# Patient Record
Sex: Female | Born: 1960 | Race: Black or African American | Hispanic: No | Marital: Married | Smoking: Former smoker
Health system: Southern US, Community
[De-identification: ages and names within clinical notes are randomized; demographics above are authoritative.]

## PROBLEM LIST (undated history)

## (undated) DIAGNOSIS — N186 End stage renal disease: Secondary | ICD-10-CM

## (undated) DIAGNOSIS — I38 Endocarditis, valve unspecified: Secondary | ICD-10-CM

## (undated) DIAGNOSIS — K922 Gastrointestinal hemorrhage, unspecified: Secondary | ICD-10-CM

## (undated) DIAGNOSIS — J90 Pleural effusion, not elsewhere classified: Secondary | ICD-10-CM

## (undated) DIAGNOSIS — I480 Paroxysmal atrial fibrillation: Secondary | ICD-10-CM

## (undated) DIAGNOSIS — J449 Chronic obstructive pulmonary disease, unspecified: Secondary | ICD-10-CM

## (undated) DIAGNOSIS — Z992 Dependence on renal dialysis: Secondary | ICD-10-CM

## (undated) DIAGNOSIS — D631 Anemia in chronic kidney disease: Secondary | ICD-10-CM

## (undated) DIAGNOSIS — D649 Anemia, unspecified: Secondary | ICD-10-CM

## (undated) DIAGNOSIS — K219 Gastro-esophageal reflux disease without esophagitis: Secondary | ICD-10-CM

## (undated) DIAGNOSIS — I5042 Chronic combined systolic (congestive) and diastolic (congestive) heart failure: Secondary | ICD-10-CM

## (undated) DIAGNOSIS — E119 Type 2 diabetes mellitus without complications: Secondary | ICD-10-CM

## (undated) DIAGNOSIS — N289 Disorder of kidney and ureter, unspecified: Secondary | ICD-10-CM

## (undated) DIAGNOSIS — K439 Ventral hernia without obstruction or gangrene: Secondary | ICD-10-CM

## (undated) DIAGNOSIS — K4090 Unilateral inguinal hernia, without obstruction or gangrene, not specified as recurrent: Secondary | ICD-10-CM

## (undated) DIAGNOSIS — N2581 Secondary hyperparathyroidism of renal origin: Secondary | ICD-10-CM

## (undated) DIAGNOSIS — E785 Hyperlipidemia, unspecified: Secondary | ICD-10-CM

## (undated) DIAGNOSIS — I69322 Dysarthria following cerebral infarction: Secondary | ICD-10-CM

## (undated) DIAGNOSIS — I050 Rheumatic mitral stenosis: Secondary | ICD-10-CM

## (undated) DIAGNOSIS — I700 Atherosclerosis of aorta: Secondary | ICD-10-CM

## (undated) DIAGNOSIS — I6529 Occlusion and stenosis of unspecified carotid artery: Secondary | ICD-10-CM

## (undated) DIAGNOSIS — I639 Cerebral infarction, unspecified: Secondary | ICD-10-CM

## (undated) DIAGNOSIS — I052 Rheumatic mitral stenosis with insufficiency: Secondary | ICD-10-CM

## (undated) DIAGNOSIS — I10 Essential (primary) hypertension: Secondary | ICD-10-CM

## (undated) DIAGNOSIS — N189 Chronic kidney disease, unspecified: Secondary | ICD-10-CM

## (undated) DIAGNOSIS — G459 Transient cerebral ischemic attack, unspecified: Secondary | ICD-10-CM

## (undated) DIAGNOSIS — N185 Chronic kidney disease, stage 5: Secondary | ICD-10-CM

## (undated) DIAGNOSIS — J45909 Unspecified asthma, uncomplicated: Secondary | ICD-10-CM

## (undated) DIAGNOSIS — I214 Non-ST elevation (NSTEMI) myocardial infarction: Secondary | ICD-10-CM

## (undated) HISTORY — DX: Rheumatic mitral stenosis: I05.0

## (undated) HISTORY — DX: Chronic combined systolic (congestive) and diastolic (congestive) heart failure: I50.42

## (undated) HISTORY — DX: Hyperlipidemia, unspecified: E78.5

## (undated) HISTORY — DX: Ventral hernia without obstruction or gangrene: K43.9

## (undated) HISTORY — DX: Essential (primary) hypertension: I10

## (undated) HISTORY — DX: Chronic obstructive pulmonary disease, unspecified: J44.9

## (undated) HISTORY — DX: End stage renal disease: N18.6

## (undated) HISTORY — DX: Anemia, unspecified: D64.9

## (undated) HISTORY — DX: Type 2 diabetes mellitus without complications: E11.9

## (undated) HISTORY — DX: Dependence on renal dialysis: Z99.2

## (undated) HISTORY — PX: CHOLECYSTECTOMY: SHX55

## (undated) HISTORY — DX: Endocarditis, valve unspecified: I38

## (undated) HISTORY — DX: Unilateral inguinal hernia, without obstruction or gangrene, not specified as recurrent: K40.90

## (undated) HISTORY — PX: ABDOMINAL HYSTERECTOMY: SHX81

## (undated) HISTORY — DX: Paroxysmal atrial fibrillation: I48.0

## (undated) HISTORY — PX: FOOT SURGERY: SHX648

## (undated) HISTORY — PX: KNEE SURGERY: SHX244

---

## 2004-05-21 ENCOUNTER — Other Ambulatory Visit: Payer: Self-pay

## 2004-08-22 ENCOUNTER — Other Ambulatory Visit: Payer: Self-pay

## 2004-11-14 ENCOUNTER — Ambulatory Visit: Payer: Self-pay | Admitting: Unknown Physician Specialty

## 2005-12-31 ENCOUNTER — Ambulatory Visit: Payer: Self-pay | Admitting: Nephrology

## 2008-03-15 ENCOUNTER — Emergency Department: Payer: Self-pay | Admitting: Emergency Medicine

## 2008-03-30 ENCOUNTER — Ambulatory Visit: Payer: Self-pay | Admitting: Gastroenterology

## 2008-05-12 ENCOUNTER — Ambulatory Visit: Payer: Self-pay | Admitting: Internal Medicine

## 2008-05-17 ENCOUNTER — Ambulatory Visit: Payer: Self-pay | Admitting: Internal Medicine

## 2008-06-20 LAB — HM COLONOSCOPY: HM Colonoscopy: NORMAL

## 2010-05-21 ENCOUNTER — Ambulatory Visit: Payer: Self-pay | Admitting: Internal Medicine

## 2011-09-09 ENCOUNTER — Other Ambulatory Visit: Payer: Self-pay | Admitting: Internal Medicine

## 2011-09-10 MED ORDER — AMLODIPINE BESYLATE 5 MG PO TABS: 5.0000 mg | ORAL_TABLET | Freq: Every day | ORAL | Status: DC

## 2011-11-18 ENCOUNTER — Telehealth: Payer: Self-pay | Admitting: Internal Medicine

## 2011-11-18 NOTE — Telephone Encounter (Signed)
Spoke with patient. She has an appt to see you on Thursday, but she is asking what she could take to help with her symptoms until she is seen. She is having some sore throat, feeling dizzy, having some drainage. She says that she has a lot of congestion and it is causing her to have a hard time breathing especially when she is lying down. Please advise.

## 2011-11-18 NOTE — Telephone Encounter (Signed)
Left message asking patient to return my call.

## 2011-11-18 NOTE — Telephone Encounter (Signed)
Patient is congested ,having headaches and dizziness. Please call needing to be seen or something called in and patient also needs a note for work she has been out for two days.

## 2011-11-18 NOTE — Telephone Encounter (Signed)
Dorothy Long,    my response was not routed apparently .  Mucines DM for cough and drainaiage,   Sudafed PE  For dizziness.

## 2011-11-18 NOTE — Telephone Encounter (Signed)
She can take mucinex DM or the drainage and cough and sudafed PE for the dizziness.

## 2011-11-20 ENCOUNTER — Ambulatory Visit: Payer: Self-pay | Admitting: Internal Medicine

## 2011-11-20 NOTE — Telephone Encounter (Signed)
Patient notified of OTC medications she can take.

## 2012-03-08 ENCOUNTER — Telehealth: Payer: Self-pay | Admitting: Internal Medicine

## 2012-03-08 MED ORDER — AMLODIPINE BESYLATE 5 MG PO TABS: 5.0000 mg | ORAL_TABLET | Freq: Every day | ORAL | Status: DC

## 2012-03-08 NOTE — Telephone Encounter (Signed)
Rx called in . Patient notified

## 2012-03-08 NOTE — Telephone Encounter (Signed)
[phone] or cell [phone]  Pt called checking about her refill on amlodipine [facility] [place] has not heard back from you Pt is making cpx appointment for 05/24/12 Pt is completely out of meds

## 2012-05-24 ENCOUNTER — Encounter: Payer: Self-pay | Admitting: Internal Medicine

## 2012-06-28 ENCOUNTER — Encounter: Payer: Self-pay | Admitting: Internal Medicine

## 2012-07-21 ENCOUNTER — Ambulatory Visit (INDEPENDENT_AMBULATORY_CARE_PROVIDER_SITE_OTHER): Payer: BC Managed Care – PPO | Admitting: Internal Medicine

## 2012-07-21 ENCOUNTER — Encounter: Payer: Self-pay | Admitting: Internal Medicine

## 2012-07-21 VITALS — BP 140/88 | HR 92 | Temp 97.8°F | Resp 14 | Ht 70.0 in | Wt 116.2 lb

## 2012-07-21 DIAGNOSIS — E785 Hyperlipidemia, unspecified: Secondary | ICD-10-CM

## 2012-07-21 DIAGNOSIS — Z720 Tobacco use: Secondary | ICD-10-CM

## 2012-07-21 DIAGNOSIS — Z1211 Encounter for screening for malignant neoplasm of colon: Secondary | ICD-10-CM

## 2012-07-21 DIAGNOSIS — R609 Edema, unspecified: Secondary | ICD-10-CM

## 2012-07-21 DIAGNOSIS — Z13220 Encounter for screening for lipoid disorders: Secondary | ICD-10-CM

## 2012-07-21 DIAGNOSIS — R634 Abnormal weight loss: Secondary | ICD-10-CM

## 2012-07-21 DIAGNOSIS — E559 Vitamin D deficiency, unspecified: Secondary | ICD-10-CM

## 2012-07-21 DIAGNOSIS — Z1239 Encounter for other screening for malignant neoplasm of breast: Secondary | ICD-10-CM

## 2012-07-21 DIAGNOSIS — Z800 Family history of malignant neoplasm of digestive organs: Secondary | ICD-10-CM

## 2012-07-21 DIAGNOSIS — I10 Essential (primary) hypertension: Secondary | ICD-10-CM

## 2012-07-21 DIAGNOSIS — R5383 Other fatigue: Secondary | ICD-10-CM

## 2012-07-21 DIAGNOSIS — F17200 Nicotine dependence, unspecified, uncomplicated: Secondary | ICD-10-CM

## 2012-07-21 DIAGNOSIS — Z87891 Personal history of nicotine dependence: Secondary | ICD-10-CM | POA: Insufficient documentation

## 2012-07-21 DIAGNOSIS — N189 Chronic kidney disease, unspecified: Secondary | ICD-10-CM

## 2012-07-21 DIAGNOSIS — R600 Localized edema: Secondary | ICD-10-CM

## 2012-07-21 DIAGNOSIS — A499 Bacterial infection, unspecified: Secondary | ICD-10-CM

## 2012-07-21 DIAGNOSIS — B9689 Other specified bacterial agents as the cause of diseases classified elsewhere: Secondary | ICD-10-CM

## 2012-07-21 DIAGNOSIS — J329 Chronic sinusitis, unspecified: Secondary | ICD-10-CM

## 2012-07-21 DIAGNOSIS — R5381 Other malaise: Secondary | ICD-10-CM

## 2012-07-21 LAB — COMPREHENSIVE METABOLIC PANEL
ALT: 14 U/L (ref 0–35)
AST: 30 U/L (ref 0–37)
Albumin: 3.2 g/dL — ABNORMAL LOW (ref 3.5–5.2)
Alkaline Phosphatase: 78 U/L (ref 39–117)
Glucose, Bld: 79 mg/dL (ref 70–99)
Potassium: 4.6 mEq/L (ref 3.5–5.1)
Sodium: 138 mEq/L (ref 135–145)
Total Bilirubin: 0.8 mg/dL (ref 0.3–1.2)
Total Protein: 6.8 g/dL (ref 6.0–8.3)

## 2012-07-21 LAB — CBC WITH DIFFERENTIAL/PLATELET
Basophils Absolute: 0 10*3/uL (ref 0.0–0.1)
Eosinophils Absolute: 0.1 10*3/uL (ref 0.0–0.7)
HCT: 42.1 % (ref 36.0–46.0)
Lymphs Abs: 1.6 10*3/uL (ref 0.7–4.0)
MCHC: 33.6 g/dL (ref 30.0–36.0)
MCV: 90.2 fl (ref 78.0–100.0)
Monocytes Absolute: 0.4 10*3/uL (ref 0.1–1.0)
Neutrophils Relative %: 57.4 % (ref 43.0–77.0)
Platelets: 151 10*3/uL (ref 150.0–400.0)
RDW: 14.1 % (ref 11.5–14.6)
WBC: 4.8 10*3/uL (ref 4.5–10.5)

## 2012-07-21 LAB — MICROALBUMIN / CREATININE URINE RATIO
Creatinine,U: 107.4 mg/dL
Microalb Creat Ratio: 452.4 mg/g — ABNORMAL HIGH (ref 0.0–30.0)
Microalb, Ur: 485.7 mg/dL — ABNORMAL HIGH (ref 0.0–1.9)

## 2012-07-21 MED ORDER — NICOTINE 14 MG/24HR TD PT24: 1.0000 | MEDICATED_PATCH | TRANSDERMAL | Status: AC

## 2012-07-21 MED ORDER — AMOXICILLIN-POT CLAVULANATE 875-125 MG PO TABS
1.0000 | ORAL_TABLET | Freq: Two times a day (BID) | ORAL | Status: AC
Start: 2012-07-21 — End: 2012-07-31

## 2012-07-21 NOTE — Progress Notes (Signed)
Patient ID: Dorothy Long, female   DOB: 1961-02-10, 51 y.o.   MRN: [id number] Patient Active Problem List  Diagnosis  . Sinusitis, bacterial  . Hypertension  . Family history of colon cancer  . Tobacco abuse  . Chronic kidney disease  . Hyperlipidemia  . Edema leg    Subjective:  CC:   Chief Complaint  Patient presents with  . Annual Exam    HPI:   Dorothy Ratliffis a 51 y.o. female who presents with history of hypertension.  Low vitamin D, history of chronic  kidney disease and new complaints.   Recent onset of upper respiratory symptoms with sore throat, itchy eyes, congestion involving sinuses and eustachian tubes.  No fevers but chills,  Nasal discharge is yellow.  Continues to abuse tobacco so she has a chronic cough. Smoking  a pack daily.  New onset LE edema.    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   Past Medical History  Diagnosis Date  . Hypertension   . Chronic kidney disease   . Hyperlipidemia         Allergies  Allergen Reactions  . Diflucan (Fluconazole)   . Morphine And Related   . Tetracyclines & Related   . Valium (Diazepam)     Past Surgical History  Procedure Date  . Abdominal hysterectomy   . Cholecystectomy     History   Social History  . Marital Status: Married    Spouse Name: N/A    Number of Children: N/A  . Years of Education: N/A   Occupational History  . Not on file.   Social History Main Topics  . Smoking status: Current Everyday Smoker -- 1.0 packs/day    Types: Cigarettes  . Smokeless tobacco: Never Used  . Alcohol Use: Yes  . Drug Use: No  . Sexually Active: Not on file   Other Topics Concern  . Not on file   Social History Narrative  . No narrative on file   Family History  Problem Relation Age of Onset  . Cancer Mother     colon  . Heart disease Father   . Hypertension Father    Review of Systems:  Review of Systems    Constitutional: Negative.   HENT: Positive for ear pain, congestion and sore throat.   Eyes: Positive for redness.  Respiratory: Positive for cough.   Cardiovascular: Positive for leg swelling.  Gastrointestinal: Negative.   Genitourinary: Negative.   Musculoskeletal: Negative.  Negative for joint pain.  Skin: Negative.   Neurological: Positive for headaches. Negative for dizziness.  Endo/Heme/Allergies: Negative.   Psychiatric/Behavioral: Negative.    the remainder of a comprehensive review of systems is negative.    Objective:  BP 140/88  Pulse 92  Temp 97.8 F (36.6 C) (Oral)  Resp 14  Ht 5\' 10"  (1.778 m)  Wt 116 lb 4 oz (52.731 kg)  BMI 16.68 kg/m2  SpO2 99%  General appearance: alert, cooperative and appears stated age Ears: normal TM's and external ear canals both ears Throat: lips, mucosa, and tongue normal; teeth and gums normal Neck: no adenopathy, no carotid bruit, supple, symmetrical, trachea midline and thyroid not enlarged, symmetric, no tenderness/mass/nodules Back: symmetric, no curvature. ROM normal. No CVA tenderness. Lungs: clear to auscultation bilaterally Heart: regular rate and rhythm, S1, S2 normal, no murmur, click, rub or gallop Abdomen: soft, non-tender; bowel sounds normal; no masses,  no organomegaly Pulses: 2+ and  symmetric Skin: Skin color, texture, turgor normal. No rashes or lesions Lymph nodes: Cervical, supraclavicular, and axillary nodes normal.  Assessment and Plan:  Tobacco abuse We reviewed her history of smoking. She quit once for a year on a bet  but resumed after she wanted the bet she is not interested in pharmacotherapy wants to try the NicoDerm patches. I've written a prescription for these. .    Sinusitis, bacterial Her physical as exam suggests involvement of frontal sinuses and both years with bacterial infection. Recommended use of amoxicillin for one week, sinus lavage and Afrin topical decongestant.  Hypertension Not  well controlled on current amlodipine dose. She is having increased lower extremity edema and it is unclear whether this is due to the amlodipine or due to venous insufficiency.  given her concurrent kidney disease as evidenced by her creatinine on today's labs. Do not want to start an ACE inhibitor or ARB until we know what her kidney function is stable. We will consider adding a low-dose beta blocker for improved control.   Edema leg She has proteinuria by current urinalysis along with chronic kidney disease. I do not want to start a diuretic at this point given her GFR less than 30 and no recent reference point in time. She has no signs of heart failure on exam. She will need evaluation of circulation in the lower extremities with venous Dopplers.  Family history of colon cancer She is apparent with a history of colon cancer and has not had screening herself I given her age. Will refer for colonoscopy at her annual physical.  Hyperlipidemia Currently uncontrolled with LDL of 160. She will need to start a statin and a goal will be 7200 given her concurrent hypertension and chronic kidney disease.  Chronic kidney disease There are no records available for review and comparison. Current GFR is less than 30. Electrolytes are normal. Will send her for renal ultrasound and nephrology evaluation.   Updated Medication List Outpatient Encounter Prescriptions as of 07/21/2012  Medication Sig Dispense Refill  . amLODipine (NORVASC) 5 MG tablet Take 1 tablet (5 mg total) by mouth daily.  30 tablet  3  . amoxicillin-clavulanate (AUGMENTIN) 875-125 MG per tablet Take 1 tablet by mouth 2 (two) times daily.  14 tablet  0  . atorvastatin (LIPITOR) 20 MG tablet Take 1 tablet (20 mg total) by mouth daily.  90 tablet  3  . metoprolol tartrate (LOPRESSOR) 25 MG tablet Take 1 tablet (25 mg total) by mouth 2 (two) times daily.  180 tablet  3  . nicotine (NICODERM CQ) 14 mg/24hr patch Place 1 patch onto the skin  daily.  28 patch  3     Orders Placed This Encounter  Procedures  . MM Digital Screening  . US Renal  . Microalbumin / creatinine urine ratio  . TSH  . LDL cholesterol, direct  . CBC with Differential  . Comprehensive metabolic panel  . Vitamin D 25 hydroxy  . Ambulatory referral to Gastroenterology    Return in about 3 months (around 10/21/2012).

## 2012-07-21 NOTE — Patient Instructions (Addendum)
You have a sinus/ear infection   .  I am prescribing an antibiotic augmentin, please take it after a big breakfast and after a  Big dinner.   I also advise use of the following OTC meds to help with your other symptoms.   Take generic OTC benadryl 25 mg every 8 hours for the drainage,  Sudafed PE  10 to 30 mg every 8 hours for the congestion, you may substitute Afrin nasal spray for the nighttime dose of sudafed PE  If needed to prevent insomnia.  flushes your sinuses twice daily with Simply Saline (do over the sink because if you do it right you will spit out globs of mucus)  Use OTC  Delsym   FOR THE COUGH.  Gargle with salt water as needed for sore throat.    Return at your convenience for your annual physical  We will call you with the results of your labs and a new blood pressure medication

## 2012-07-22 ENCOUNTER — Telehealth: Payer: Self-pay | Admitting: Internal Medicine

## 2012-07-22 ENCOUNTER — Encounter: Payer: Self-pay | Admitting: Internal Medicine

## 2012-07-22 DIAGNOSIS — N289 Disorder of kidney and ureter, unspecified: Secondary | ICD-10-CM

## 2012-07-22 DIAGNOSIS — E559 Vitamin D deficiency, unspecified: Secondary | ICD-10-CM | POA: Insufficient documentation

## 2012-07-22 DIAGNOSIS — R600 Localized edema: Secondary | ICD-10-CM | POA: Insufficient documentation

## 2012-07-22 DIAGNOSIS — N189 Chronic kidney disease, unspecified: Secondary | ICD-10-CM | POA: Insufficient documentation

## 2012-07-22 DIAGNOSIS — E785 Hyperlipidemia, unspecified: Secondary | ICD-10-CM | POA: Insufficient documentation

## 2012-07-22 LAB — VITAMIN D 25 HYDROXY (VIT D DEFICIENCY, FRACTURES): Vit D, 25-Hydroxy: 17 ng/mL — ABNORMAL LOW (ref 30–89)

## 2012-07-22 MED ORDER — METOPROLOL TARTRATE 25 MG PO TABS: 25.0000 mg | ORAL_TABLET | Freq: Two times a day (BID) | ORAL | Status: DC

## 2012-07-22 MED ORDER — ATORVASTATIN CALCIUM 20 MG PO TABS: 20.0000 mg | ORAL_TABLET | Freq: Every day | ORAL | Status: DC

## 2012-07-22 MED ORDER — ERGOCALCIFEROL 1.25 MG (50000 UT) PO CAPS: 50000.0000 [IU] | ORAL_CAPSULE | ORAL | Status: AC

## 2012-07-22 NOTE — Assessment & Plan Note (Signed)
Her physical as exam suggests involvement of frontal sinuses and both years with bacterial infection. Recommended use of amoxicillin for one week, sinus lavage and Afrin topical decongestant.

## 2012-07-22 NOTE — Assessment & Plan Note (Signed)
Not well controlled on current amlodipine dose. She is having increased lower extremity edema and it is unclear whether this is due to the amlodipine or due to venous insufficiency.  given her concurrent kidney disease as evidenced by her creatinine on today's labs. Do not want to start an ACE inhibitor or ARB until we know what her kidney function is stable. We will consider adding a low-dose beta blocker for improved control.

## 2012-07-22 NOTE — Assessment & Plan Note (Signed)
There are no records available for review and comparison. Current GFR is less than 30. Electrolytes are normal. Will send her for renal ultrasound and nephrology evaluation.

## 2012-07-22 NOTE — Assessment & Plan Note (Signed)
Currently uncontrolled with LDL of 160. She will need to start a statin and a goal will be 7200 given her concurrent hypertension and chronic kidney disease.

## 2012-07-22 NOTE — Telephone Encounter (Signed)
Patient wants to know why she is having a Renal US.

## 2012-07-22 NOTE — Assessment & Plan Note (Signed)
She is apparent with a history of colon cancer and has not had screening herself I given her age. Will refer for colonoscopy at her annual physical.

## 2012-07-22 NOTE — Assessment & Plan Note (Signed)
Current level is 17. She's not taking any supplements clear. Will call in Drisdol 50,000 units weekly for one month followed by Onalee Hua vitamin D thereafter at 1000 units daily.

## 2012-07-22 NOTE — Assessment & Plan Note (Signed)
We reviewed her history of smoking. She quit once for a year on a bet  but resumed after she wanted the bet she is not interested in pharmacotherapy wants to try the NicoDerm patches. I've written a prescription for these. Dorothy Long

## 2012-07-22 NOTE — Assessment & Plan Note (Signed)
She has proteinuria by current urinalysis along with chronic kidney disease. I do not want to start a diuretic at this point given her GFR less than 30 and no recent reference point in time. She has no signs of heart failure on exam. She will need evaluation of circulation in the lower extremities with venous Dopplers.

## 2012-07-22 NOTE — Telephone Encounter (Signed)
I have also called in several new medications for her to take in addition to her current amlodipine. These include metoprolol twice daily for her blood pressure, Drisdol which is  The mega vitamin D dose.  once a week for a month,  and Lipitor for her cholesterol. In patients with kidney disease it is very important to keep the cholesterol control in her status way above goal with her LDL currently 160.

## 2012-07-23 NOTE — Telephone Encounter (Signed)
Left message asking patient to return my call.

## 2012-07-23 NOTE — Telephone Encounter (Signed)
See other phone and result note.

## 2012-07-27 ENCOUNTER — Telehealth: Payer: Self-pay | Admitting: Internal Medicine

## 2012-07-27 NOTE — Telephone Encounter (Signed)
Patient wants to talk with the doctor as to why she is being seen by a nephrologist.

## 2012-07-27 NOTE — Telephone Encounter (Signed)
I have tried to call patient a few times with no call back.  I have asked Erie Noe the next time patient calls to come get me so I can talk to her when she calls since we keep playing "phone tag".  This is being handled in the result note and telephone encounter.

## 2012-07-28 ENCOUNTER — Ambulatory Visit: Payer: Self-pay | Admitting: Internal Medicine

## 2012-07-30 NOTE — Telephone Encounter (Signed)
Left another message asking patient to return my call.

## 2012-08-02 NOTE — Telephone Encounter (Signed)
Patient was notified.

## 2012-08-04 ENCOUNTER — Encounter: Payer: Self-pay | Admitting: *Deleted

## 2012-08-05 ENCOUNTER — Encounter: Payer: Self-pay | Admitting: Internal Medicine

## 2012-08-24 ENCOUNTER — Other Ambulatory Visit: Payer: Self-pay | Admitting: *Deleted

## 2012-08-24 MED ORDER — AMLODIPINE BESYLATE 5 MG PO TABS: 5.0000 mg | ORAL_TABLET | Freq: Every day | ORAL | Status: DC

## 2012-12-13 ENCOUNTER — Ambulatory Visit: Payer: Self-pay | Admitting: Internal Medicine

## 2012-12-13 ENCOUNTER — Ambulatory Visit: Payer: BC Managed Care – PPO | Admitting: Internal Medicine

## 2012-12-13 ENCOUNTER — Telehealth: Payer: Self-pay | Admitting: Internal Medicine

## 2012-12-13 LAB — HM MAMMOGRAPHY: HM Mammogram: NORMAL

## 2012-12-13 NOTE — Telephone Encounter (Signed)
Appt made on 1/6.

## 2012-12-13 NOTE — Telephone Encounter (Signed)
I cannot fill out the form she dropped of until she has  To have a current  BP check by this office.  Her form is daue by January 10th she she needs to make appt with me or Raquel ASAP

## 2012-12-20 ENCOUNTER — Encounter: Payer: Self-pay | Admitting: Internal Medicine

## 2012-12-20 ENCOUNTER — Ambulatory Visit (INDEPENDENT_AMBULATORY_CARE_PROVIDER_SITE_OTHER): Payer: BC Managed Care – PPO | Admitting: Internal Medicine

## 2012-12-20 VITALS — BP 130/84 | HR 55 | Temp 98.4°F | Resp 16 | Wt 121.2 lb

## 2012-12-20 DIAGNOSIS — Z716 Tobacco abuse counseling: Secondary | ICD-10-CM

## 2012-12-20 DIAGNOSIS — Z1331 Encounter for screening for depression: Secondary | ICD-10-CM

## 2012-12-20 DIAGNOSIS — Z7189 Other specified counseling: Secondary | ICD-10-CM

## 2012-12-20 DIAGNOSIS — E785 Hyperlipidemia, unspecified: Secondary | ICD-10-CM

## 2012-12-20 DIAGNOSIS — H6690 Otitis media, unspecified, unspecified ear: Secondary | ICD-10-CM | POA: Insufficient documentation

## 2012-12-20 DIAGNOSIS — I10 Essential (primary) hypertension: Secondary | ICD-10-CM

## 2012-12-20 DIAGNOSIS — F17200 Nicotine dependence, unspecified, uncomplicated: Secondary | ICD-10-CM

## 2012-12-20 MED ORDER — PREDNISONE (PAK) 10 MG PO TABS: ORAL_TABLET | ORAL | Status: DC

## 2012-12-20 MED ORDER — LEVOFLOXACIN 500 MG PO TABS: 500.0000 mg | ORAL_TABLET | Freq: Every day | ORAL | Status: DC

## 2012-12-20 NOTE — Assessment & Plan Note (Signed)
Given chronicity of symptoms, development of facial pain and exam consistent with bacterial URI,  Will treat with empiric antibiotics, decongestants, and saline lavage.   

## 2012-12-20 NOTE — Patient Instructions (Addendum)
You have a sinus/ear infection   .  I am prescribing an antibiotic levaquin  and prednisone taper  To manage the infectin and the inflammation in your ear/sinuses.   I also advise use of the following OTC meds to help with your other symptoms.   Take generic OTC benadryl 25 mg every 8 hours for the drainage,  Sudafed PE  10 to 30 mg every 8 hours for the congestion, you may substitute Afrin nasal spray for the nighttime dose of sudafed PE  If needed to prevent insomnia.  flushes your sinuses twice daily with Simply Saline (do over the sink because if you do it right you will spit out globs of mucus)   Gargle with salt water as needed for sore throat.    Continue amlodipine and metoprolol for your blood pressures

## 2012-12-20 NOTE — Assessment & Plan Note (Addendum)
Goal LDL < 100,  lipitor started in August Repeat lipids due

## 2012-12-20 NOTE — Assessment & Plan Note (Signed)
The patient was counseled on the dangers of tobacco use, and was advised to quit and motivated to quit.  Reviewed strategies to maximize success, including removing cigarettes and smoking materials from environment, stress management, support of family/friends and local smoking cessation programs ().

## 2012-12-20 NOTE — Progress Notes (Signed)
Patient ID: Dorothy Long, female   DOB: 09-Dec-1961, 52 y.o.   MRN: [id number]  Patient Active Problem List  Diagnosis  . Hypertension  . Family history of colon cancer  . Tobacco abuse  . Chronic kidney disease  . Hyperlipidemia  . Edema leg  . Vitamin d deficiency  . Otitis media  . Tobacco abuse counseling    Subjective:  CC:   Chief Complaint  Patient presents with  . Paperwork    HPI:   Dorothy Ratliffis a 52 y.o. female who presents Hypertension.  Low vitamin D, leg cramps.  History of chronic  kidney disease. Recent onset of upper respiratory symptoms with sore throat, itchy eyes, congestion involving sinuses and eustachian tubes.  No fevers but chills,  Nasal discharge is yellow.  Continues to abuse tobacco so she has a chronic cough. Smoking  a pack daily.  New onset LE edema.    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   Past Medical History  Diagnosis Date  . Hypertension   . Chronic kidney disease   . Hyperlipidemia     Past Surgical History  Procedure Date  . Abdominal hysterectomy   . Cholecystectomy          The following portions of the patient's history were reviewed and updated as appropriate: Allergies, current medications, and problem list.    Review of Systems:   12 Pt  review of systems was negative except those addressed in the HPI,     History   Social History  . Marital Status: Married    Spouse Name: N/A    Number of Children: N/A  . Years of Education: N/A   Occupational History  . Not on file.   Social History Main Topics  . Smoking status: Current Every Day Smoker -- 1.0 packs/day    Types: Cigarettes  . Smokeless tobacco: Never Used  . Alcohol Use: Yes  . Drug Use: No  . Sexually Active: Not on file   Other Topics Concern  . Not on file   Social History Narrative  . No narrative on file    Objective:  BP 130/84  Pulse 55  Temp  98.4 F (36.9 C) (Oral)  Resp 16  Wt 121 lb 4 oz (54.999 kg)  SpO2 99%  General appearance: alert, cooperative and appears stated age Ears: normal TM's and external ear canals both ears Throat: lips, mucosa, and tongue normal; teeth and gums normal Neck: no adenopathy, no carotid bruit, supple, symmetrical, trachea midline and thyroid not enlarged, symmetric, no tenderness/mass/nodules Back: symmetric, no curvature. ROM normal. No CVA tenderness. Lungs: clear to auscultation bilaterally Heart: regular rate and rhythm, S1, S2 normal, no murmur, click, rub or gallop Abdomen: soft, non-tender; bowel sounds normal; no masses,  no organomegaly Pulses: 2+ and symmetric Skin: Skin color, texture, turgor normal. No rashes or lesions Lymph nodes: Cervical, supraclavicular, and axillary nodes normal.  Assessment and Plan:  Hypertension Well controlled on current medications.  No changes today.  Hyperlipidemia Goal LDL < 100,  lipitor started in August Repeat lipids due  Tobacco abuse counseling The patient was counseled on the dangers of tobacco use, and was advised to quit and motivated to quit.  Reviewed strategies to maximize success, including removing cigarettes and smoking materials from environment, stress management, support of family/friends and local smoking cessation programs ().  Otitis media Given chronicity of symptoms, development of facial pain  and exam consistent with bacterial URI,  Will treat with empiric antibiotics, decongestants, and saline lavage.     Updated Medication List Outpatient Encounter Prescriptions as of 12/20/2012  Medication Sig Dispense Refill  . amLODipine (NORVASC) 5 MG tablet Take 1 tablet (5 mg total) by mouth daily.  30 tablet  3  . atorvastatin (LIPITOR) 20 MG tablet Take 1 tablet (20 mg total) by mouth daily.  90 tablet  3  . ergocalciferol (DRISDOL) 50000 UNITS capsule Take 1 capsule (50,000 Units total) by mouth once a week.  4 capsule  0  .  metoprolol tartrate (LOPRESSOR) 25 MG tablet Take 1 tablet (25 mg total) by mouth 2 (two) times daily.  180 tablet  3  . levofloxacin (LEVAQUIN) 500 MG tablet Take 1 tablet (500 mg total) by mouth daily.  7 tablet  0  . predniSONE (STERAPRED UNI-PAK) 10 MG tablet 6 tablets on day 1, decrease by tablet daily until gone  21 tablet  0     Orders Placed This Encounter  Procedures  . Lipid panel  . Comprehensive metabolic panel  . TSH    No Follow-up on file.

## 2012-12-20 NOTE — Assessment & Plan Note (Signed)
Well controlled on current medications.  No changes today. 

## 2013-01-04 ENCOUNTER — Encounter: Payer: Self-pay | Admitting: Internal Medicine

## 2013-01-09 ENCOUNTER — Emergency Department: Payer: Self-pay | Admitting: Internal Medicine

## 2013-03-07 ENCOUNTER — Other Ambulatory Visit: Payer: Self-pay | Admitting: Internal Medicine

## 2013-05-04 ENCOUNTER — Other Ambulatory Visit: Payer: Self-pay | Admitting: *Deleted

## 2013-05-04 MED ORDER — AMLODIPINE BESYLATE 5 MG PO TABS: ORAL_TABLET | ORAL | Status: DC

## 2013-05-04 NOTE — Telephone Encounter (Signed)
Rx sent to pharmacy by escript  

## 2013-07-07 ENCOUNTER — Inpatient Hospital Stay: Payer: Self-pay | Admitting: Internal Medicine

## 2013-07-07 DIAGNOSIS — I059 Rheumatic mitral valve disease, unspecified: Secondary | ICD-10-CM

## 2013-07-07 DIAGNOSIS — D631 Anemia in chronic kidney disease: Secondary | ICD-10-CM

## 2013-07-07 DIAGNOSIS — I5021 Acute systolic (congestive) heart failure: Secondary | ICD-10-CM

## 2013-07-07 DIAGNOSIS — N039 Chronic nephritic syndrome with unspecified morphologic changes: Secondary | ICD-10-CM

## 2013-07-07 DIAGNOSIS — R0602 Shortness of breath: Secondary | ICD-10-CM

## 2013-07-07 LAB — URINALYSIS, COMPLETE
Bilirubin,UR: NEGATIVE
Glucose,UR: 50 mg/dL (ref 0–75)
Nitrite: NEGATIVE
Ph: 5 (ref 4.5–8.0)
Protein: >=500
RBC,UR: 1 /HPF (ref 0–5)
WBC UR: 2 /HPF (ref 0–5)

## 2013-07-07 LAB — COMPREHENSIVE METABOLIC PANEL
BUN: 53 mg/dL — ABNORMAL HIGH (ref 7–18)
Bilirubin,Total: 0.3 mg/dL (ref 0.2–1.0)
Calcium, Total: 8 mg/dL — ABNORMAL LOW (ref 8.5–10.1)
Co2: 15 mmol/L — ABNORMAL LOW (ref 21–32)
Creatinine: 5.45 mg/dL — ABNORMAL HIGH (ref 0.60–1.30)
Osmolality: 288 (ref 275–301)
Potassium: 3.7 mmol/L (ref 3.5–5.1)

## 2013-07-07 LAB — TROPONIN I: Troponin-I: 0.04 ng/mL

## 2013-07-07 LAB — CBC
MCHC: 33.7 g/dL (ref 32.0–36.0)
RBC: 3.12 10*6/uL — ABNORMAL LOW (ref 3.80–5.20)
RDW: 13.7 % (ref 11.5–14.5)
WBC: 5.6 10*3/uL (ref 3.6–11.0)

## 2013-07-07 LAB — PHOSPHORUS: Phosphorus: 5.5 mg/dL — ABNORMAL HIGH (ref 2.5–4.9)

## 2013-07-07 LAB — CK TOTAL AND CKMB (NOT AT ARMC)
CK, Total: 206 U/L (ref 21–215)
CK-MB: 1.7 ng/mL (ref 0.5–3.6)
CK-MB: 1.5 ng/mL (ref 0.5–3.6)

## 2013-07-08 LAB — BASIC METABOLIC PANEL
Anion Gap: 13 (ref 7–16)
BUN: 60 mg/dL — ABNORMAL HIGH (ref 7–18)
Chloride: 106 mmol/L (ref 98–107)
Co2: 17 mmol/L — ABNORMAL LOW (ref 21–32)
Creatinine: 6.04 mg/dL — ABNORMAL HIGH (ref 0.60–1.30)
EGFR (African American): 9 — ABNORMAL LOW
EGFR (Non-African Amer.): 7 — ABNORMAL LOW
Glucose: 140 mg/dL — ABNORMAL HIGH (ref 65–99)
Osmolality: 291 (ref 275–301)
Sodium: 136 mmol/L (ref 136–145)

## 2013-07-08 LAB — CBC WITH DIFFERENTIAL/PLATELET
Basophil #: 0 10*3/uL (ref 0.0–0.1)
Basophil %: 0.1 %
Eosinophil #: 0 10*3/uL (ref 0.0–0.7)
HCT: 26 % — ABNORMAL LOW (ref 35.0–47.0)
HGB: 8.8 g/dL — ABNORMAL LOW (ref 12.0–16.0)
Lymphocyte %: 8 %
MCH: 29.2 pg (ref 26.0–34.0)
Monocyte #: 0.3 x10 3/mm (ref 0.2–0.9)
Monocyte %: 3.4 %
Neutrophil %: 88.5 %
RBC: 3 10*6/uL — ABNORMAL LOW (ref 3.80–5.20)
RDW: 13.9 % (ref 11.5–14.5)

## 2013-07-08 LAB — TSH: Thyroid Stimulating Horm: 0.827 u[IU]/mL

## 2013-07-09 DIAGNOSIS — I5043 Acute on chronic combined systolic (congestive) and diastolic (congestive) heart failure: Secondary | ICD-10-CM

## 2013-07-09 LAB — BASIC METABOLIC PANEL
Anion Gap: 10 (ref 7–16)
BUN: 73 mg/dL — ABNORMAL HIGH (ref 7–18)
Calcium, Total: 7.8 mg/dL — ABNORMAL LOW (ref 8.5–10.1)
Chloride: 105 mmol/L (ref 98–107)
Co2: 20 mmol/L — ABNORMAL LOW (ref 21–32)
Creatinine: 6.22 mg/dL — ABNORMAL HIGH (ref 0.60–1.30)
EGFR (Non-African Amer.): 7 — ABNORMAL LOW
Osmolality: 293 (ref 275–301)
Sodium: 135 mmol/L — ABNORMAL LOW (ref 136–145)

## 2013-07-10 LAB — CBC WITH DIFFERENTIAL/PLATELET
Basophil #: 0 x10 3/mm 3
Basophil %: 0.2 %
Eosinophil #: 0 x10 3/mm 3
Eosinophil %: 0 %
HCT: 25.9 % — ABNORMAL LOW
HGB: 8.5 g/dL — ABNORMAL LOW
Lymphocyte %: 14.5 %
Lymphs Abs: 1.3 x10 3/mm 3
MCH: 28.8 pg
MCHC: 33 g/dL
MCV: 87 fL
Monocyte #: 0.5 "x10 3/mm "
Monocyte %: 5.1 %
Neutrophil #: 7.2 x10 3/mm 3 — ABNORMAL HIGH
Neutrophil %: 80.2 %
Platelet: 165 x10 3/mm 3
RBC: 2.97 X10 6/mm 3 — ABNORMAL LOW
RDW: 14.2 %
WBC: 9 x10 3/mm 3

## 2013-07-10 LAB — BASIC METABOLIC PANEL
Anion Gap: 11 (ref 7–16)
BUN: 76 mg/dL — AB (ref 4–21)
BUN: 76 mg/dL — ABNORMAL HIGH (ref 7–18)
Creatinine: 6.05 mg/dL — ABNORMAL HIGH (ref 0.60–1.30)
EGFR (African American): 9 — ABNORMAL LOW
EGFR (Non-African Amer.): 7 — ABNORMAL LOW
Glucose: 127 mg/dL
Glucose: 127 mg/dL — ABNORMAL HIGH (ref 65–99)
Osmolality: 287 (ref 275–301)
Potassium: 4.4 mmol/L (ref 3.5–5.1)
Sodium: 131 mmol/L — ABNORMAL LOW (ref 136–145)

## 2013-07-10 LAB — PROTIME-INR
INR: 0.9
Prothrombin Time: 12.2 secs (ref 11.5–14.7)

## 2013-07-10 LAB — CBC AND DIFFERENTIAL: WBC: 8.1 10^3/mL

## 2013-07-11 LAB — URINALYSIS, COMPLETE
Blood: NEGATIVE
Nitrite: NEGATIVE
Ph: 6 (ref 4.5–8.0)
Protein: 100
RBC,UR: <1 /HPF (ref 0–5)
Specific Gravity: 1.006 (ref 1.003–1.030)
Squamous Epithelial: <1

## 2013-07-11 LAB — CBC WITH DIFFERENTIAL/PLATELET
Basophil #: 0 10*3/uL (ref 0.0–0.1)
Eosinophil #: 0 10*3/uL (ref 0.0–0.7)
Eosinophil %: 0 %
HCT: 25.9 % — ABNORMAL LOW (ref 35.0–47.0)
HGB: 8.1 g/dL — ABNORMAL LOW (ref 12.0–16.0)
Lymphocyte #: 1.2 10*3/uL (ref 1.0–3.6)
MCV: 87 fL (ref 80–100)
Neutrophil %: 75.7 %
Platelet: 178 10*3/uL (ref 150–440)
RBC: 2.99 10*6/uL — ABNORMAL LOW (ref 3.80–5.20)
RDW: 14 % (ref 11.5–14.5)
WBC: 8.3 10*3/uL (ref 3.6–11.0)

## 2013-07-11 LAB — COMPREHENSIVE METABOLIC PANEL
Albumin: 2.8 g/dL — ABNORMAL LOW (ref 3.4–5.0)
Bilirubin,Total: 0.2 mg/dL (ref 0.2–1.0)
Calcium, Total: 7.7 mg/dL — ABNORMAL LOW (ref 8.5–10.1)
Chloride: 104 mmol/L (ref 98–107)
Co2: 21 mmol/L (ref 21–32)
Creatinine: 6.15 mg/dL — ABNORMAL HIGH (ref 0.60–1.30)
EGFR (African American): 8 — ABNORMAL LOW
Glucose: 123 mg/dL — ABNORMAL HIGH (ref 65–99)
Osmolality: 296 (ref 275–301)
Potassium: 4.6 mmol/L (ref 3.5–5.1)
SGOT(AST): 19 U/L (ref 15–37)
SGPT (ALT): 21 U/L (ref 12–78)
Sodium: 135 mmol/L — ABNORMAL LOW (ref 136–145)
Total Protein: 6.2 g/dL — ABNORMAL LOW (ref 6.4–8.2)

## 2013-07-11 LAB — PROTEIN / CREATININE RATIO, URINE
Creatinine, Urine: 27.2 mg/dL — ABNORMAL LOW (ref 30.0–125.0)
Protein/Creat. Ratio: 3934 mg/gCREAT — ABNORMAL HIGH (ref 0–200)

## 2013-07-12 ENCOUNTER — Telehealth: Payer: Self-pay | Admitting: Internal Medicine

## 2013-07-12 DIAGNOSIS — R0602 Shortness of breath: Secondary | ICD-10-CM

## 2013-07-12 NOTE — Telephone Encounter (Signed)
Patient has a hospital follow up scheduled on 8.4.14 @ 3:00.

## 2013-07-13 ENCOUNTER — Telehealth: Payer: Self-pay

## 2013-07-13 NOTE — Telephone Encounter (Signed)
Patient contacted regarding discharge from [facility] on 07/12/13.  Patient understands to follow up with provider Dr Mariah Milling on 07/28/13 at 1:45p at [facility] Patient understands discharge instructions? yes Patient understands medications and regiment? yes Patient understands to bring all medications to this visit? yes   Pt states she may run into financial trouble obtaining meds in the future, but at present is taking all medications as rx.  Advised pt to call if problems arise with affording medications. Pt verbalized understanding and recognizes importance of taking meds as rx.

## 2013-07-13 NOTE — Telephone Encounter (Signed)
Message copied by Migdalia Dk on Wed Jul 13, 2013  9:44 AM ------      Message from: Coralee Rud      Created: Tue Jul 12, 2013 12:38 PM      Regarding: tcm/ph       8/14 1:45 dr Mariah Milling ------

## 2013-07-14 ENCOUNTER — Encounter: Payer: Self-pay | Admitting: Internal Medicine

## 2013-07-14 ENCOUNTER — Telehealth: Payer: Self-pay | Admitting: Internal Medicine

## 2013-07-14 DIAGNOSIS — N185 Chronic kidney disease, stage 5: Secondary | ICD-10-CM | POA: Insufficient documentation

## 2013-07-14 DIAGNOSIS — I050 Rheumatic mitral stenosis: Secondary | ICD-10-CM | POA: Insufficient documentation

## 2013-07-14 HISTORY — DX: Chronic kidney disease, stage 5: N18.5

## 2013-07-14 NOTE — Telephone Encounter (Signed)
Patient recently hospitalized,  Please arrange 30 minute hospital follow up

## 2013-07-18 ENCOUNTER — Ambulatory Visit (INDEPENDENT_AMBULATORY_CARE_PROVIDER_SITE_OTHER): Payer: BC Managed Care – PPO | Admitting: Internal Medicine

## 2013-07-18 ENCOUNTER — Encounter: Payer: Self-pay | Admitting: Internal Medicine

## 2013-07-18 VITALS — BP 128/70 | HR 77 | Temp 98.6°F | Resp 14 | Wt 125.5 lb

## 2013-07-18 DIAGNOSIS — N186 End stage renal disease: Secondary | ICD-10-CM

## 2013-07-18 DIAGNOSIS — I052 Rheumatic mitral stenosis with insufficiency: Secondary | ICD-10-CM

## 2013-07-18 DIAGNOSIS — I10 Essential (primary) hypertension: Secondary | ICD-10-CM

## 2013-07-18 DIAGNOSIS — E785 Hyperlipidemia, unspecified: Secondary | ICD-10-CM

## 2013-07-18 DIAGNOSIS — N189 Chronic kidney disease, unspecified: Secondary | ICD-10-CM

## 2013-07-18 DIAGNOSIS — E559 Vitamin D deficiency, unspecified: Secondary | ICD-10-CM

## 2013-07-18 DIAGNOSIS — N039 Chronic nephritic syndrome with unspecified morphologic changes: Secondary | ICD-10-CM

## 2013-07-18 NOTE — Progress Notes (Signed)
Patient ID: Dorothy Long, female   DOB: 1961/03/29, 52 y.o.   MRN: [id number]  Patient Active Problem List   Diagnosis Date Noted  . Mitral stenosis with incompetence or regurgitation 07/19/2013  . End stage kidney disease 07/14/2013  . Otitis media 12/20/2012  . Tobacco abuse counseling 12/20/2012  . Vitamin D deficiency 07/22/2012  . Hyperlipidemia   . Hypertension 07/21/2012  . Family history of colon cancer 07/21/2012  . Tobacco abuse 07/21/2012    Subjective:  CC:   Chief Complaint  Patient presents with  . Follow-up    Hospital follow up discharge DX COPD, Renal failure, congestive heart failure reported by patient.    HPI:   Dorothy Ratliffis a 52 y.o. female who presents for Hospital follow up for diastolic congestive heart failure and severe mitral stenosis . A patient is a 52 year old female with a history of chronic kidney disease last known creatinine 1.8 one year ago who was admitted to [facility] on July 24 with respiratory failure  She was treated initially with steroids and bronchodilators in the ER with no improvement and was admitted with hypertensive crisis and tachycardia and found to be in heart failure. Echocardiogram showed severe mitral stenosis and regurgitation with an ejection fraction of 45%. She was also found to be in severe renal failure with creatinine which failed to improve despite intervention by nephrology with steroids. She did not undergo kidney biopsy due to severely atrophic right kidney and atrophic renal cortex on the left. She was also treated Epogen for anemia and has seen Dr. Cherylann Ratel who has recommended starting dialysis but she has requested a second opinion and is scheduled to see Dr. Sydnee Levans at [facility] on August 25.Marland Kitchen    She has been taking her medications as directed but has not been following a low-sodium diet and is still smoking. She has not adjusted her furosemide dose for overnight weight gain has noticed ankle edema. She has a long   tobacco history and has cut back her cigarettes to 5 per day  and is trying very hard to wean herself off of these. She's been using E. cigarettes because the Vapor cigarettes made her very agitated.  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 Cherylann Ratel has taken her out of   work for 6 weeks .     Past Medical History  Diagnosis Date  . Hypertension   . Chronic kidney disease   . Hyperlipidemia     Past Surgical History  Procedure Laterality Date  . Abdominal hysterectomy    . Cholecystectomy         The following portions of the patient's history were reviewed and updated as appropriate: Allergies, current medications, and problem list.    Review of Systems:   12 Pt  review of systems was negative except those addressed in the HPI,     History   Social History  . Marital Status: Married    Spouse Name: N/A    Number of Children: N/A  . Years of Education: N/A   Occupational History  . Not on file.   Social History Main Topics  . Smoking status: Current Every Day Smoker -- 1.00 packs/day    Types: Cigarettes  . Smokeless tobacco: Never Used  . Alcohol Use: Yes  . Drug Use: No  . Sexually Active: Not on file   Other Topics Concern  . Not on file   Social History Narrative  .  No narrative on file    Objective:  BP 128/70  Pulse 77  Temp(Src) 98.6 F (37 C) (Oral)  Resp 14  Wt 125 lb 8 oz (56.926 kg)  BMI 18.01 kg/m2  SpO2 96%  General appearance: thin, alert, cooperative and appears stated age Ears: normal TM's and external ear canals both ears Throat: lips, mucosa, and tongue normal; teeth and gums normal Neck: no adenopathy, no carotid bruit, supple, symmetrical, trachea midline and thyroid not enlarged, symmetric, no tenderness/mass/nodules Back: symmetric, no curvature. ROM normal. No CVA tenderness. Lungs: clear to auscultation bilaterally Heart: regular rate and rhythm, S1, S2 normal, no  murmur, click, rub or gallop Abdomen: soft, non-tender; bowel sounds normal; no masses,  no organomegaly Pulses: 2+ and symmetric Skin: Skin color, texture, turgor normal. No rashes or lesions Lymph nodes: Cervical, supraclavicular, and axillary nodes normal. Ext: bilateral ankle edema.   Assessment and Plan:  Mitral stenosis with incompetence or regurgitation Confirmed by echocardiogram done during recent  hospitalization. She has a normal lung exam today, is not hypoxic or short of breath. She is not following a low sodium diet and this was discussed in detail today with handout for 2 gram sodium diet given. She does have some ankle edema on 40 mg of Lasix daily she was instructed to take an additional dose of Lasix for overnight weight gain of 2 pounds. She will followup with cardiology in several days.  Hyperlipidemia Managed with atorvastatin. She will return in 6 weeks for fasting lipids.  End stage kidney disease Patient is scheduled to see a nephrologist at [facility] for second opinion on kidney disease and need for dialysis.  She wants to hold off on any catheter placement until she sees him.  Hypertension Well-controlled on current regimen. No changes today.  Vitamin D deficiency Managed with Drisdol .  repeat level is pending per Dr. Garnett Farm note from July 31.  A total of 40 minutes was spent with patient more than half of which was spent in counseling, reviewing records from other prviders and coordination of care.  Updated Medication List Outpatient Encounter Prescriptions as of 07/18/2013  Medication Sig Dispense Refill  . ALPRAZolam (XANAX) 0.25 MG tablet Take 0.25 mg by mouth 3 (three) times daily as needed for sleep.      Marland Kitchen amLODipine (NORVASC) 5 MG tablet TAKE 1 TABLET (5 MG TOTAL) BY MOUTH DAILY.  30 tablet  5  . carvedilol (COREG) 6.25 MG tablet Take 6.25 mg by mouth 2 (two) times daily with a meal.      . Fluticasone-Salmeterol (ADVAIR) 100-50 MCG/DOSE AEPB Inhale 1  puff into the lungs every 12 (twelve) hours.      . furosemide (LASIX) 40 MG tablet Take 40 mg by mouth.      Marland Kitchen omeprazole (PRILOSEC) 20 MG capsule Take 20 mg by mouth daily.      . predniSONE (STERAPRED UNI-PAK) 10 MG tablet 6 tablets on day 1, decrease by tablet daily until gone  21 tablet  0  . atorvastatin (LIPITOR) 20 MG tablet Take 1 tablet (20 mg total) by mouth daily.  90 tablet  3  . ergocalciferol (DRISDOL) 50000 UNITS capsule Take 1 capsule (50,000 Units total) by mouth once a week.  4 capsule  0  . levofloxacin (LEVAQUIN) 500 MG tablet Take 1 tablet (500 mg total) by mouth daily.  7 tablet  0  . metoprolol tartrate (LOPRESSOR) 25 MG tablet Take 1 tablet (25 mg total) by mouth 2 (two) times  daily.  180 tablet  3   No facility-administered encounter medications on file as of 07/18/2013.     Orders Placed This Encounter  Procedures  . CBC and differential  . Basic metabolic panel  . TSH    No Follow-up on file.      And with the statin and and and

## 2013-07-19 ENCOUNTER — Encounter: Payer: Self-pay | Admitting: Internal Medicine

## 2013-07-19 DIAGNOSIS — I052 Rheumatic mitral stenosis with insufficiency: Secondary | ICD-10-CM | POA: Insufficient documentation

## 2013-07-19 DIAGNOSIS — D631 Anemia in chronic kidney disease: Secondary | ICD-10-CM | POA: Insufficient documentation

## 2013-07-19 NOTE — Assessment & Plan Note (Signed)
Receiving Epo from nephrology,  First dose in hospital.  Repeat Hb needed.

## 2013-07-19 NOTE — Assessment & Plan Note (Signed)
Will repeat level is pending per Dr. Garnett Farm note from July 31.

## 2013-07-19 NOTE — Assessment & Plan Note (Signed)
Patient is scheduled to see a nephrologist at [facility] for second opinion on kidney disease and need for dialysis.  She wants to hold off on any catheter placement until she sees him.

## 2013-07-19 NOTE — Assessment & Plan Note (Signed)
Managed with atorvastatin. She will return in 6 weeks for fasting lipids.

## 2013-07-19 NOTE — Assessment & Plan Note (Signed)
Well-controlled on current regimen. No changes today. 

## 2013-07-19 NOTE — Patient Instructions (Signed)
You need to reduce your sodium intake to 2000 mg daily.  Please use the handout provided to help guide you on the sodium content of foods.   Take an extra dose of furosemide if you gain 2 lbs overnight.

## 2013-07-19 NOTE — Assessment & Plan Note (Signed)
Confirmed by echocardiogram done during recent  hospitalization. She has a normal lung exam today, is not hypoxic or short of breath. She is not following a low sodium diet and this was discussed in detail today with handout for 2 gram sodium diet given. She does have some ankle edema on 40 mg of Lasix daily she was instructed to take an additional dose of Lasix for overnight weight gain of 2 pounds. She will followup with cardiology in several days.

## 2013-07-28 ENCOUNTER — Encounter: Payer: Self-pay | Admitting: Cardiovascular Disease

## 2013-07-28 ENCOUNTER — Ambulatory Visit (INDEPENDENT_AMBULATORY_CARE_PROVIDER_SITE_OTHER): Payer: BC Managed Care – PPO | Admitting: Cardiovascular Disease

## 2013-07-28 VITALS — BP 120/72 | HR 85 | Ht 70.0 in | Wt 125.5 lb

## 2013-07-28 DIAGNOSIS — I10 Essential (primary) hypertension: Secondary | ICD-10-CM

## 2013-07-28 DIAGNOSIS — F17200 Nicotine dependence, unspecified, uncomplicated: Secondary | ICD-10-CM

## 2013-07-28 DIAGNOSIS — N186 End stage renal disease: Secondary | ICD-10-CM

## 2013-07-28 DIAGNOSIS — I052 Rheumatic mitral stenosis with insufficiency: Secondary | ICD-10-CM

## 2013-07-28 DIAGNOSIS — R079 Chest pain, unspecified: Secondary | ICD-10-CM

## 2013-07-28 DIAGNOSIS — R0602 Shortness of breath: Secondary | ICD-10-CM

## 2013-07-28 DIAGNOSIS — Z720 Tobacco use: Secondary | ICD-10-CM

## 2013-07-28 DIAGNOSIS — D631 Anemia in chronic kidney disease: Secondary | ICD-10-CM

## 2013-07-28 DIAGNOSIS — N189 Chronic kidney disease, unspecified: Secondary | ICD-10-CM

## 2013-07-28 MED ORDER — CARVEDILOL 6.25 MG PO TABS: 6.2500 mg | ORAL_TABLET | Freq: Two times a day (BID) | ORAL | Status: DC

## 2013-07-28 NOTE — Assessment & Plan Note (Addendum)
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 This is a chronic issue, not acute and she has been relatively asymptomatic . We have discussed the timing of when to do a TEE to further evaluate her mitral valve. She's currently asymptomatic and would prefer to wait until after her renal issues have been resolved. We'll need to closely monitor her hemoglobin. Would continue Lasix. Encouraged her to stop smoking. If she has episodes of shortness of breath, have asked her to call our office.

## 2013-07-28 NOTE — Assessment & Plan Note (Signed)
We have encouraged her to continue to work on weaning her cigarettes and smoking cessation. She will continue to work on this and does not want any assistance with chantix.  

## 2013-07-28 NOTE — Assessment & Plan Note (Signed)
Blood pressure improved on her current medications. No changes made

## 2013-07-28 NOTE — Progress Notes (Signed)
Patient ID: Dorothy Long, female    DOB: [birth year], 52 y.o.   MRN: [id number]  HPI Comments: Dorothy Long is a pleasant 52 year old woman with COPD, hypertension, recently presenting to the hospital 07/07/2013 with severe shortness of breath, pulmonary edema, renal failure, severe hypertension. Initial blood pressure 190/110 with heart rate 115. She received multiple breathing treatments, Solu-Medrol with improvement.  Echocardiogram suggested significant mitral valve disease with moderate to severe stenosis and regurgitation (regurgitation with eccentric and difficult to estimate). Mean gradient of 14 mm of mercury. Ejection fraction 45-50%, right ventricular systolic pressure 35 mm mercury  She was also found to be anemic with hemoglobin 9.2  Notes indicate she had biopsy done one year ago showing focal segmental glomerulosclerosis at [facility] and saw Dr. Wynelle Link one year ago. Creatinine at that time was 1.8 In the hospital recently creatinine 5-6  Ultrasound in the hospital showed atrophic right kidney, very thin renal cortex, biopsy could not be done successfully She was started on steroids by Dr. Thedore Mins plan to discharge home to finish her steroid course. She was given Neupogen while in the hospital It was recommended that she stop smoking.  Since her discharge, she has felt well. She reports that she has had no further severe episodes of shortness of breath as she had on previous admission. Some mild fatigue, mild shortness of breath with heavy exertion. Otherwise she feels "okay". She seeking a second opinion for renal issues at [facility] later this month.  EKG shows normal sinus rhythm with rate 85 beats per minute, poor R-wave progression through the anterior precordial leads, APCs noted   Outpatient Encounter Prescriptions as of 07/28/2013  Medication Sig Dispense Refill  . ALPRAZolam (XANAX) 0.25 MG tablet Take 0.25 mg by mouth 3 (three) times daily as needed for sleep.      Marland Kitchen amLODipine  (NORVASC) 5 MG tablet TAKE 1 TABLET (5 MG TOTAL) BY MOUTH DAILY.  30 tablet  5  . atorvastatin (LIPITOR) 20 MG tablet Take 1 tablet (20 mg total) by mouth daily.  90 tablet  3  . carvedilol (COREG) 6.25 MG tablet Take 1 tablet (6.25 mg total) by mouth 2 (two) times daily with a meal.  180 tablet  3  . Fluticasone-Salmeterol (ADVAIR) 100-50 MCG/DOSE AEPB Inhale 1 puff into the lungs every 12 (twelve) hours.      . furosemide (LASIX) 40 MG tablet Take 40 mg by mouth.      Marland Kitchen omeprazole (PRILOSEC) 20 MG capsule Take 20 mg by mouth daily.      . predniSONE (STERAPRED UNI-PAK) 10 MG tablet 6 tablets on day 1, decrease by tablet daily until gone  21 tablet  0    Review of Systems  Constitutional: Negative.   HENT: Negative.   Eyes: Negative.   Respiratory: Positive for shortness of breath.   Cardiovascular: Negative.   Gastrointestinal: Negative.   Musculoskeletal: Negative.   Skin: Negative.   Neurological: Negative.   Psychiatric/Behavioral: Negative.   All other systems reviewed and are negative.    BP 120/72  Pulse 85  Ht 5\' 10"  (1.778 m)  Wt 125 lb 8 oz (56.926 kg)  BMI 18.01 kg/m2  Physical Exam  Nursing note and vitals reviewed. Constitutional: She is oriented to person, place, and time. She appears well-developed and well-nourished.  HENT:  Head: Normocephalic.  Nose: Nose normal.  Mouth/Throat: Oropharynx is clear and moist.  Eyes: Conjunctivae are normal. Pupils are equal, round, and reactive to light.  Neck: Normal range  of motion. Neck supple. No JVD present.  Cardiovascular: Normal rate, regular rhythm, S1 normal, S2 normal and intact distal pulses.  Exam reveals no gallop and no friction rub.   Murmur heard.  Systolic murmur is present with a grade of 3/6   Diastolic murmur is present  Pulmonary/Chest: Effort normal and breath sounds normal. No respiratory distress. She has no wheezes. She has no rales. She exhibits no tenderness.  Abdominal: Soft. Bowel sounds are  normal. She exhibits no distension. There is no tenderness.  Musculoskeletal: Normal range of motion. She exhibits no edema and no tenderness.  Lymphadenopathy:    She has no cervical adenopathy.  Neurological: She is alert and oriented to person, place, and time. Coordination normal.  Skin: Skin is warm and dry. No rash noted. No erythema.  Psychiatric: She has a normal mood and affect. Her behavior is normal. Judgment and thought content normal.    Assessment and Plan

## 2013-07-28 NOTE — Assessment & Plan Note (Signed)
May need additional episodes of epogen to maintain hemoglobin 10 or more.  Worsening anemia will likely exacerbate her underlying mitral valve disease and shortness of breath episodes.

## 2013-07-28 NOTE — Assessment & Plan Note (Signed)
She is scheduled to see Dr. Lillia Dallas of the department of nephrology at [facility], fax #[phone]

## 2013-07-28 NOTE — Patient Instructions (Addendum)
You are doing well. No medication changes were made.  Please call us if you have new issues that need to be addressed before your next appt.  Your physician wants you to follow-up in: 3 months You will receive a reminder letter in the mail two months in advance. If you don't receive a letter, please call our office to schedule the follow-up appointment.   

## 2013-08-01 ENCOUNTER — Telehealth: Payer: Self-pay | Admitting: *Deleted

## 2013-08-01 NOTE — Telephone Encounter (Signed)
Request from Disability Determination Services, sent to [employer] on 08/01/13 . 

## 2013-08-08 DIAGNOSIS — N051 Unspecified nephritic syndrome with focal and segmental glomerular lesions: Secondary | ICD-10-CM | POA: Insufficient documentation

## 2013-08-10 ENCOUNTER — Encounter: Payer: Self-pay | Admitting: Cardiovascular Disease

## 2013-08-10 LAB — CBC AND DIFFERENTIAL
HCT: 28 % — AB (ref 36–46)
Platelets: 233 10*3/uL (ref 150–399)

## 2013-08-10 LAB — BASIC METABOLIC PANEL
BUN: 71 mg/dL — AB (ref 4–21)
Glucose: 86 mg/dL

## 2013-08-11 ENCOUNTER — Ambulatory Visit (INDEPENDENT_AMBULATORY_CARE_PROVIDER_SITE_OTHER): Payer: BC Managed Care – PPO | Admitting: Internal Medicine

## 2013-08-11 ENCOUNTER — Encounter: Payer: Self-pay | Admitting: *Deleted

## 2013-08-11 ENCOUNTER — Encounter: Payer: Self-pay | Admitting: Internal Medicine

## 2013-08-11 VITALS — BP 128/72 | HR 78 | Temp 98.8°F | Resp 14 | Wt 125.8 lb

## 2013-08-11 DIAGNOSIS — I10 Essential (primary) hypertension: Secondary | ICD-10-CM

## 2013-08-11 DIAGNOSIS — Z1239 Encounter for other screening for malignant neoplasm of breast: Secondary | ICD-10-CM

## 2013-08-11 DIAGNOSIS — N189 Chronic kidney disease, unspecified: Secondary | ICD-10-CM

## 2013-08-11 DIAGNOSIS — N186 End stage renal disease: Secondary | ICD-10-CM

## 2013-08-11 DIAGNOSIS — R300 Dysuria: Secondary | ICD-10-CM

## 2013-08-11 DIAGNOSIS — D631 Anemia in chronic kidney disease: Secondary | ICD-10-CM

## 2013-08-11 DIAGNOSIS — K219 Gastro-esophageal reflux disease without esophagitis: Secondary | ICD-10-CM

## 2013-08-11 LAB — URINALYSIS, ROUTINE W REFLEX MICROSCOPIC
Bilirubin Urine: NEGATIVE
Ketones, ur: NEGATIVE
Urine Glucose: NEGATIVE
Urobilinogen, UA: 0.2 (ref 0.0–1.0)

## 2013-08-11 MED ORDER — DEXLANSOPRAZOLE 60 MG PO CPDR: 60.0000 mg | DELAYED_RELEASE_CAPSULE | Freq: Every day | ORAL | Status: DC

## 2013-08-11 NOTE — Progress Notes (Signed)
Patient ID: Dorothy Long, female   DOB: 1961-08-12, 52 y.o.   MRN: [id number]   Patient Active Problem List   Diagnosis Date Noted  . Mitral stenosis with incompetence or regurgitation 07/19/2013  . Anemia in chronic kidney disease 07/19/2013  . End stage kidney disease 07/14/2013  . Otitis media 12/20/2012  . Tobacco abuse counseling 12/20/2012  . Vitamin D deficiency 07/22/2012  . Hyperlipidemia   . Hypertension 07/21/2012  . Family history of colon cancer 07/21/2012  . Tobacco abuse 07/21/2012    Subjective:  CC:   Chief Complaint  Patient presents with  . Follow-up    HPI:   Dorothy Ratliffis a 52 y.o. 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 She has seen a nephrologist at [facility] for second opinion on whether she is requiring dialysis at this point. He has agreed with Dr. Jeanella Cara but has repeated her basic metabolic panel which shows an improved creatinine.  She has an appointment with Dr. Jeanella Cara tomorrow   She has been suffering from pruritus without rash all over her body. She is also reporting muscle cramps occurring in her legs and hands at night. She is due for refills on her prednisone omeprazole and furosemide but has no idea whether Dr. Jeanella Cara is going to continue her taper of prednisone or continue the current dose.   She has gained 3 or 4 pounds since her last visit. She feels generally well except for the itching and muscle cramps.   Past Medical History  Diagnosis Date  . Hypertension   . Hyperlipidemia   . COPD (chronic obstructive pulmonary disease)   . Anemia   . Mitral valve stenosis, severe   . CHF (congestive heart failure)   . Chronic kidney disease   . Acute on chronic renal failure   . Diastolic congestive heart failure     Past Surgical History  Procedure Laterality Date  . Abdominal hysterectomy    .  Cholecystectomy    . Knee surgery Right   . Foot surgery Bilateral        The following portions of the patient's history were reviewed and updated as appropriate: Allergies, current medications, and problem list.    Review of Systems:   12 Pt  review of systems was negative except those addressed in the HPI,     History   Social History  . Marital Status: Married    Spouse Name: N/A    Number of Children: N/A  . Years of Education: N/A   Occupational History  . Not on file.   Social History Main Topics  . Smoking status: Current Every Day Smoker -- 0.25 packs/day for 20 years    Types: Cigarettes  . Smokeless tobacco: Never Used  . Alcohol Use: Yes     Comment: occasional  . Drug Use: No  . Sexual Activity: Not on file   Other Topics Concern  . Not on file   Social History Narrative  . No narrative on file    Objective:  Filed Vitals:   08/11/13 0819  BP: 128/72  Pulse: 78  Temp: 98.8 F (37.1 C)  Resp: 14     General appearance: alert, cooperative and appears stated age Ears: normal TM's and external ear canals both ears Throat: lips, mucosa, and tongue normal; teeth and gums normal Neck: no adenopathy, no carotid bruit, supple, symmetrical, trachea midline and  thyroid not enlarged, symmetric, no tenderness/mass/nodules Back: symmetric, no curvature. ROM normal. No CVA tenderness. Lungs: clear to auscultation bilaterally Heart: regular rate and rhythm, S1, S2 normal, no murmur, click, rub or gallop Abdomen: soft, non-tender; bowel sounds normal; no masses,  no organomegaly Pulses: 2+ and symmetric Skin: Skin color, texture, turgor normal. No rashes or lesions Lymph nodes: Cervical, supraclavicular, and axillary nodes normal.  Assessment and Plan:  End stage kidney disease Her creatinine has improved her repeat basic metabolic panel from [facility] nephrologist. Records from [facility] BUN 71 creatinine 4.8 bicarbonate 20 phosphorous 8.3 Obviously she  would like to hold off on dialysis for as long as possible. Long discussion with her today about uremia,  acid base balance and other issues that may not respond to medical management. She was urged to discuss these things again with Dr. Lourdes Sledge tomorrow.  Anemia in chronic kidney disease  Hemoglobin has improved to 9.8 per review of records available online from [place]. . She received Epogen prior to discharge [facility]  Hypertension Well controlled on current regimen. Renal function improving, no changes today.  GERD (gastroesophageal reflux disease) Symptoms are currently not controlled on omeprazole. Trial of excellent. Samples given.   Updated Medication List Outpatient Encounter Prescriptions as of 08/11/2013  Medication Sig Dispense Refill  . ALPRAZolam (XANAX) 0.25 MG tablet Take 0.25 mg by mouth 3 (three) times daily as needed for sleep.      Marland Kitchen amLODipine (NORVASC) 5 MG tablet TAKE 1 TABLET (5 MG TOTAL) BY MOUTH DAILY.  30 tablet  5  . carvedilol (COREG) 6.25 MG tablet Take 1 tablet (6.25 mg total) by mouth 2 (two) times daily with a meal.  180 tablet  3  . Fluticasone-Salmeterol (ADVAIR) 100-50 MCG/DOSE AEPB Inhale 1 puff into the lungs every 12 (twelve) hours.      . furosemide (LASIX) 40 MG tablet Take 40 mg by mouth.      Marland Kitchen omeprazole (PRILOSEC) 20 MG capsule Take 20 mg by mouth daily.      . predniSONE (STERAPRED UNI-PAK) 10 MG tablet 6 tablets on day 1, decrease by tablet daily until gone  21 tablet  0  . dexlansoprazole (DEXILANT) 60 MG capsule Take 1 capsule (60 mg total) by mouth daily.  15 capsule  0  . [DISCONTINUED] atorvastatin (LIPITOR) 20 MG tablet Take 1 tablet (20 mg total) by mouth daily.  90 tablet  3   No facility-administered encounter medications on file as of 08/11/2013.

## 2013-08-11 NOTE — Patient Instructions (Addendum)
Your Cr has improved to 4.8 (per [facility]'s labs) but your calcium is very low and your BUN is very high. This is shy your are cramping and itching.  Please discuss your symptoms with dr Jeanella Cara tomorrow.  I am substituting Dexilant for omeprazole for reflux.  Take it once daily in the morning   We will call you about the results of the urine test

## 2013-08-12 DIAGNOSIS — K219 Gastro-esophageal reflux disease without esophagitis: Secondary | ICD-10-CM | POA: Insufficient documentation

## 2013-08-12 NOTE — Assessment & Plan Note (Signed)
Symptoms are currently not controlled on omeprazole. Trial of excellent. Samples given.

## 2013-08-12 NOTE — Assessment & Plan Note (Signed)
Well controlled on current regimen. Renal function improving, no changes today.   

## 2013-08-12 NOTE — Assessment & Plan Note (Addendum)
Her creatinine has improved her repeat basic metabolic panel from [facility] nephrologist. Records from [facility] BUN 71 creatinine 4.8 bicarbonate 20 phosphorous 8.3 Obviously she would like to hold off on dialysis for as long as possible. Long discussion with her today about uremia,  acid base balance and other issues that may not respond to medical management. She was urged to discuss these things again with Dr. Lourdes Sledge tomorrow.

## 2013-08-12 NOTE — Assessment & Plan Note (Signed)
Hemoglobin has improved to 9.8 per review of records available online from [place]. . She received Epogen prior to discharge from [facility]

## 2013-08-13 LAB — CULTURE, URINE COMPREHENSIVE: Colony Count: NO GROWTH

## 2013-08-18 ENCOUNTER — Telehealth: Payer: Self-pay

## 2013-08-18 NOTE — Telephone Encounter (Signed)
Request from Disability Determination Services, sent to [employer] on 08/18/2013 .  

## 2013-08-30 ENCOUNTER — Telehealth: Payer: Self-pay

## 2013-08-30 NOTE — Telephone Encounter (Signed)
Request from Disability Determination Services , sent to [employer] on 08/30/2013.   

## 2013-09-02 ENCOUNTER — Telehealth: Payer: Self-pay

## 2013-09-02 NOTE — Telephone Encounter (Signed)
Needs surgical clearance for L arm AV graft. Please call

## 2013-09-07 NOTE — Telephone Encounter (Signed)
Cindy from [facility] called requesting cardiac clearance for left arm av graft. Left message for Arline Asp to fax over request for cardiac clearance for Dr. Mariah Milling to sign. She called back stating that the clearance must be in clinic note, either the original or an addendum. Pt cannot be scheduled for procedure until clearance is sent.

## 2013-09-08 NOTE — Telephone Encounter (Signed)
Ok to write a note on office letterhead that she is acceptable risk for AV graft surgery. I can sign, send with office note

## 2013-09-09 ENCOUNTER — Telehealth: Payer: Self-pay | Admitting: Internal Medicine

## 2013-09-09 ENCOUNTER — Observation Stay: Payer: Self-pay | Admitting: Internal Medicine

## 2013-09-09 LAB — CBC WITH DIFFERENTIAL/PLATELET
Basophil %: 0.4 %
Eosinophil %: 0.7 %
HGB: 8.5 g/dL — ABNORMAL LOW (ref 12.0–16.0)
Monocyte %: 6.9 %
Neutrophil #: 8.2 10*3/uL — ABNORMAL HIGH (ref 1.4–6.5)
Neutrophil %: 85.6 %
Platelet: 181 10*3/uL (ref 150–440)
RBC: 2.86 10*6/uL — ABNORMAL LOW (ref 3.80–5.20)
WBC: 9.6 10*3/uL (ref 3.6–11.0)

## 2013-09-09 LAB — COMPREHENSIVE METABOLIC PANEL
Anion Gap: 13 (ref 7–16)
BUN: 69 mg/dL — ABNORMAL HIGH (ref 7–18)
Bilirubin,Total: 0.5 mg/dL (ref 0.2–1.0)
Calcium, Total: 7 mg/dL — CL (ref 8.5–10.1)
Chloride: 102 mmol/L (ref 98–107)
EGFR (African American): 8 — ABNORMAL LOW
EGFR (Non-African Amer.): 7 — ABNORMAL LOW
Glucose: 97 mg/dL (ref 65–99)
Osmolality: 281 (ref 275–301)
Potassium: 3.9 mmol/L (ref 3.5–5.1)
SGOT(AST): 18 U/L (ref 15–37)
Total Protein: 6.8 g/dL (ref 6.4–8.2)

## 2013-09-09 NOTE — Telephone Encounter (Signed)
Patient needs to be seen today because of diarrhea. She has renal failure and is close to needing dialysis .  She may be dehydrated,  She may have c dificile and at the very least needs to give [place] stool samples so we can check for this infection. Please convince her to see one of the providers here if there is an appt available.  If not she needs to go to the ER

## 2013-09-09 NOTE — Telephone Encounter (Signed)
Letter sent 09/09/13 to [facility].

## 2013-09-09 NOTE — Telephone Encounter (Signed)
FYI

## 2013-09-09 NOTE — Telephone Encounter (Signed)
No answer on home phone left message for patient to return my call. Able to reach patient on mobile Dr. Cherylann Ratel is admitting patient today.

## 2013-09-09 NOTE — Telephone Encounter (Signed)
Patient Information:  Caller Name: Abree  Phone: [phone]  Patient: Dorothy Long, Dorothy Long  Gender: Female  DOB: [birth year]  Age: 52 Years  PCP: Duncan Dull (Adults only)  Pregnant: No  Office Follow Up:  Does the office need to follow up with this patient?: No  Instructions For The Office: N/A  RN Note:  Provided Care instructions. Patient wants to see Dr. Darrick Huntsman. She is not in the office today . Offered appt with another provider and declined. Encouraged to call back for questions, changes or concerns. Undestanding expressed.  Symptoms  Reason For Call & Symptoms: Patient complaining of Diarrhea onset Wednesday 09/07/13.  Afebrile. She states stools x6 daily , described as liquid dark brown, no vomiting. No abdominal pain. No history of stomach issues.  HX ESRD. Descreased appetite and +drinking. Food intake today none  Reviewed Health History In EMR: Yes  Reviewed Medications In EMR: Yes  Reviewed Allergies In EMR: Yes  Reviewed Surgeries / Procedures: Yes  Date of Onset of Symptoms: 09/07/2013 OB / GYN:  LMP: Unknown  Guideline(s) Used:  Diarrhea  Disposition Per Guideline:   See Within 3 Days in Office  Reason For Disposition Reached:   Patient wants to be seen  Advice Given:  Reassurance:  In healthy adults, new-onset diarrhea is usually caused by a viral infection of the intestines, which you can treat at home. Diarrhea is the body's way of getting rid of the infection. Here are some tips on how to keep ahead of the fluid losses.  Fluids:  Drink more fluids, at least 8-10 glasses (8 oz or 240 ml) daily.  For example: sports drinks, diluted fruit juices, soft drinks.  Supplement this with saltine crackers or soups to make certain that you are getting sufficient fluid and salt to meet your body's needs.  Avoid caffeinated beverages (Reason: caffeine is mildly dehydrating).  Nutrition:  Ideal initial foods include boiled starches/cereals (e.g., potatoes, rice,  noodles, wheat, oats) with a small amount of salt to taste.  Other acceptable foods include: bananas, yogurt, crackers, soup.  Diarrhea Medication  - Imodium AD:   Helps reduce diarrhea.  Adult dosage: 4 mg (2 capsules or 4 teaspoons or 20 ml) is the recommended first dose. You may take an additional 2 mg (1 capsule or 2 teaspoons or 10 ml) after each loose BM.  Maximum dosage: 16 mg (8 capsules or 16 teaspoons or 80 ml).  1 capsule = 2 mg; 1 teaspoon (5 ml) = 1 mg.  Caution: Do not use if you have a fever greater than 100F (37.8C). Do not use if there is blood or mucus in your stools. Do not use for more than 2 days.  Read all package instructions.  Expected Course:  Viral diarrhea lasts 4-7 days. Always worse on days 1 and 2.  Call Back If:  Signs of dehydration occur (e.g., no urine for more than 12 hours, very dry mouth, lightheaded, etc.)  Diarrhea lasts over 7 days  You become worse.  RN Overrode Recommendation:  Make Appointment  Wanted an appt only with Dr. Darrick Huntsman.  She is not in the office today

## 2013-09-10 LAB — URINALYSIS, COMPLETE
Ketone: NEGATIVE
Protein: 100
Specific Gravity: 1.006 (ref 1.003–1.030)
Squamous Epithelial: 1

## 2013-09-10 LAB — CBC WITH DIFFERENTIAL/PLATELET
Basophil #: 0 10*3/uL (ref 0.0–0.1)
Eosinophil #: 0 10*3/uL (ref 0.0–0.7)
HCT: 23.1 % — ABNORMAL LOW (ref 35.0–47.0)
HGB: 8.2 g/dL — ABNORMAL LOW (ref 12.0–16.0)
MCH: 30.1 pg (ref 26.0–34.0)
MCHC: 35.3 g/dL (ref 32.0–36.0)
MCV: 85 fL (ref 80–100)
Monocyte #: 0.2 x10 3/mm (ref 0.2–0.9)
Monocyte %: 2.6 %
Neutrophil #: 8.4 10*3/uL — ABNORMAL HIGH (ref 1.4–6.5)
Platelet: 190 10*3/uL (ref 150–440)
RBC: 2.71 10*6/uL — ABNORMAL LOW (ref 3.80–5.20)
RDW: 14.3 % (ref 11.5–14.5)

## 2013-09-10 LAB — BASIC METABOLIC PANEL
Anion Gap: 14 (ref 7–16)
BUN: 70 mg/dL — ABNORMAL HIGH (ref 7–18)
Calcium, Total: 6.8 mg/dL — CL (ref 8.5–10.1)
Co2: 13 mmol/L — ABNORMAL LOW (ref 21–32)
Creatinine: 7.15 mg/dL — ABNORMAL HIGH (ref 0.60–1.30)
EGFR (African American): 7 — ABNORMAL LOW
EGFR (Non-African Amer.): 6 — ABNORMAL LOW
Glucose: 136 mg/dL — ABNORMAL HIGH (ref 65–99)
Osmolality: 287 (ref 275–301)
Potassium: 4.1 mmol/L (ref 3.5–5.1)
Sodium: 132 mmol/L — ABNORMAL LOW (ref 136–145)

## 2013-09-10 LAB — MAGNESIUM: Magnesium: 1 mg/dL — ABNORMAL LOW

## 2013-09-15 ENCOUNTER — Ambulatory Visit: Payer: Self-pay | Admitting: Vascular Surgery

## 2013-09-15 LAB — CBC
HGB: 8.3 g/dL — ABNORMAL LOW (ref 12.0–16.0)
MCH: 29.1 pg (ref 26.0–34.0)
MCHC: 34.2 g/dL (ref 32.0–36.0)
MCV: 85 fL (ref 80–100)
Platelet: 292 10*3/uL (ref 150–440)
WBC: 9.5 10*3/uL (ref 3.6–11.0)

## 2013-09-15 LAB — BASIC METABOLIC PANEL
Anion Gap: 13 (ref 7–16)
Calcium, Total: 7.8 mg/dL — ABNORMAL LOW (ref 8.5–10.1)
Co2: 13 mmol/L — ABNORMAL LOW (ref 21–32)
Creatinine: 7.55 mg/dL — ABNORMAL HIGH (ref 0.60–1.30)
EGFR (African American): 7 — ABNORMAL LOW

## 2013-09-17 ENCOUNTER — Inpatient Hospital Stay: Payer: Self-pay | Admitting: Internal Medicine

## 2013-09-17 LAB — COMPREHENSIVE METABOLIC PANEL
Alkaline Phosphatase: 66 U/L (ref 50–136)
Anion Gap: 17 — ABNORMAL HIGH (ref 7–16)
BUN: 84 mg/dL — ABNORMAL HIGH (ref 7–18)
Chloride: 101 mmol/L (ref 98–107)
Co2: 12 mmol/L — ABNORMAL LOW (ref 21–32)
Creatinine: 7.38 mg/dL — ABNORMAL HIGH (ref 0.60–1.30)
EGFR (Non-African Amer.): 6 — ABNORMAL LOW
Glucose: 89 mg/dL (ref 65–99)
Osmolality: 286 (ref 275–301)
Potassium: 3.6 mmol/L (ref 3.5–5.1)
SGOT(AST): 19 U/L (ref 15–37)
SGPT (ALT): 12 U/L (ref 12–78)
Total Protein: 6.7 g/dL (ref 6.4–8.2)

## 2013-09-17 LAB — TROPONIN I: Troponin-I: 0.24 ng/mL — ABNORMAL HIGH

## 2013-09-17 LAB — PRO B NATRIURETIC PEPTIDE: B-Type Natriuretic Peptide: 60664 pg/mL — ABNORMAL HIGH (ref 0–125)

## 2013-09-17 LAB — CBC
HCT: 22.3 % — ABNORMAL LOW (ref 35.0–47.0)
HGB: 7.7 g/dL — ABNORMAL LOW (ref 12.0–16.0)
MCHC: 34.4 g/dL (ref 32.0–36.0)
MCV: 85 fL (ref 80–100)
Platelet: 272 10*3/uL (ref 150–440)
RBC: 2.61 10*6/uL — ABNORMAL LOW (ref 3.80–5.20)
WBC: 7.3 10*3/uL (ref 3.6–11.0)

## 2013-09-17 LAB — CK TOTAL AND CKMB (NOT AT ARMC): CK, Total: 32 U/L (ref 21–215)

## 2013-09-18 LAB — CBC WITH DIFFERENTIAL/PLATELET
Basophil %: 0.5 %
Eosinophil %: 0.1 %
HCT: 19.6 % — ABNORMAL LOW (ref 35.0–47.0)
HGB: 6.8 g/dL — ABNORMAL LOW (ref 12.0–16.0)
MCH: 29.1 pg (ref 26.0–34.0)
MCV: 84 fL (ref 80–100)
Neutrophil #: 7.4 10*3/uL — ABNORMAL HIGH (ref 1.4–6.5)
Platelet: 265 10*3/uL (ref 150–440)
RBC: 2.33 10*6/uL — ABNORMAL LOW (ref 3.80–5.20)
RDW: 15 % — ABNORMAL HIGH (ref 11.5–14.5)

## 2013-09-18 LAB — URINALYSIS, COMPLETE
Bilirubin,UR: NEGATIVE
Nitrite: NEGATIVE
Protein: 100
RBC,UR: 3 /HPF (ref 0–5)

## 2013-09-18 LAB — BASIC METABOLIC PANEL
Calcium, Total: 7.3 mg/dL — ABNORMAL LOW (ref 8.5–10.1)
Chloride: 105 mmol/L (ref 98–107)
Co2: 12 mmol/L — ABNORMAL LOW (ref 21–32)
EGFR (African American): 7 — ABNORMAL LOW
EGFR (Non-African Amer.): 6 — ABNORMAL LOW
Glucose: 143 mg/dL — ABNORMAL HIGH (ref 65–99)
Sodium: 132 mmol/L — ABNORMAL LOW (ref 136–145)

## 2013-09-18 LAB — TROPONIN I: Troponin-I: 0.1 ng/mL — ABNORMAL HIGH

## 2013-09-19 LAB — BASIC METABOLIC PANEL
Anion Gap: 14 (ref 7–16)
BUN: 85 mg/dL — ABNORMAL HIGH (ref 7–18)
Calcium, Total: 7.1 mg/dL — ABNORMAL LOW (ref 8.5–10.1)
Creatinine: 7.38 mg/dL — ABNORMAL HIGH (ref 0.60–1.30)
EGFR (Non-African Amer.): 6 — ABNORMAL LOW
Glucose: 147 mg/dL — ABNORMAL HIGH (ref 65–99)
Potassium: 3.4 mmol/L — ABNORMAL LOW (ref 3.5–5.1)

## 2013-09-19 LAB — CBC WITH DIFFERENTIAL/PLATELET
Basophil #: 0 10*3/uL (ref 0.0–0.1)
Eosinophil %: 0.1 %
HCT: 18.1 % — ABNORMAL LOW (ref 35.0–47.0)
HGB: 6.3 g/dL — ABNORMAL LOW (ref 12.0–16.0)
Lymphocyte #: 0.6 10*3/uL — ABNORMAL LOW (ref 1.0–3.6)
Lymphocyte %: 5.9 %
MCH: 28.8 pg (ref 26.0–34.0)
MCV: 83 fL (ref 80–100)
Monocyte #: 0.6 x10 3/mm (ref 0.2–0.9)
Monocyte %: 6.1 %
Neutrophil #: 8.7 10*3/uL — ABNORMAL HIGH (ref 1.4–6.5)
Platelet: 266 10*3/uL (ref 150–440)
RBC: 2.18 10*6/uL — ABNORMAL LOW (ref 3.80–5.20)
RDW: 14.8 % — ABNORMAL HIGH (ref 11.5–14.5)
WBC: 9.9 10*3/uL (ref 3.6–11.0)

## 2013-09-19 LAB — CLOSTRIDIUM DIFFICILE BY PCR

## 2013-09-19 LAB — FERRITIN: Ferritin (ARMC): 378 ng/mL (ref 8–388)

## 2013-09-19 LAB — IRON AND TIBC
Iron Bind.Cap.(Total): 180 ug/dL — ABNORMAL LOW (ref 250–450)
Iron: 39 ug/dL — ABNORMAL LOW (ref 50–170)

## 2013-09-19 LAB — OCCULT BLOOD X 1 CARD TO LAB, STOOL: Occult Blood, Feces: NEGATIVE

## 2013-09-20 LAB — STOOL CULTURE

## 2013-09-20 LAB — WBCS, STOOL

## 2013-09-20 LAB — URINE CULTURE

## 2013-09-21 DIAGNOSIS — I4891 Unspecified atrial fibrillation: Secondary | ICD-10-CM

## 2013-09-21 DIAGNOSIS — I059 Rheumatic mitral valve disease, unspecified: Secondary | ICD-10-CM

## 2013-09-21 LAB — BASIC METABOLIC PANEL
BUN: 66 mg/dL — ABNORMAL HIGH (ref 7–18)
Calcium, Total: 8.4 mg/dL — ABNORMAL LOW (ref 8.5–10.1)
Chloride: 101 mmol/L (ref 98–107)
Co2: 20 mmol/L — ABNORMAL LOW (ref 21–32)
EGFR (African American): 9 — ABNORMAL LOW
Potassium: 4.3 mmol/L (ref 3.5–5.1)
Sodium: 133 mmol/L — ABNORMAL LOW (ref 136–145)

## 2013-09-21 LAB — CBC WITH DIFFERENTIAL/PLATELET
Basophil #: 0 10*3/uL (ref 0.0–0.1)
Eosinophil %: 0.2 %
HGB: 7.6 g/dL — ABNORMAL LOW (ref 12.0–16.0)
Lymphocyte #: 0.7 10*3/uL — ABNORMAL LOW (ref 1.0–3.6)
Lymphocyte %: 7 %
MCV: 82 fL (ref 80–100)
Monocyte #: 0.4 x10 3/mm (ref 0.2–0.9)
Monocyte %: 4.3 %
Neutrophil #: 8.3 10*3/uL — ABNORMAL HIGH (ref 1.4–6.5)
Neutrophil %: 88.3 %
Platelet: 229 10*3/uL (ref 150–440)
RBC: 2.64 10*6/uL — ABNORMAL LOW (ref 3.80–5.20)
RDW: 15.7 % — ABNORMAL HIGH (ref 11.5–14.5)

## 2013-09-21 LAB — PHOSPHORUS: Phosphorus: 7.4 mg/dL — ABNORMAL HIGH (ref 2.5–4.9)

## 2013-09-22 LAB — CBC WITH DIFFERENTIAL/PLATELET
Eosinophil %: 0.8 %
HGB: 7.5 g/dL — ABNORMAL LOW (ref 12.0–16.0)
Lymphocyte #: 1 10*3/uL (ref 1.0–3.6)
Lymphocyte %: 11 %
MCH: 29.1 pg (ref 26.0–34.0)
MCHC: 34.8 g/dL (ref 32.0–36.0)
Monocyte #: 0.7 x10 3/mm (ref 0.2–0.9)
Monocyte %: 7.6 %
Neutrophil #: 7.2 10*3/uL — ABNORMAL HIGH (ref 1.4–6.5)
Neutrophil %: 80.4 %
Platelet: 228 10*3/uL (ref 150–440)
RDW: 15.3 % — ABNORMAL HIGH (ref 11.5–14.5)

## 2013-09-22 LAB — BASIC METABOLIC PANEL
BUN: 34 mg/dL — ABNORMAL HIGH (ref 7–18)
Calcium, Total: 8 mg/dL — ABNORMAL LOW (ref 8.5–10.1)
Chloride: 98 mmol/L (ref 98–107)
Co2: 27 mmol/L (ref 21–32)
Creatinine: 4.21 mg/dL — ABNORMAL HIGH (ref 0.60–1.30)
EGFR (African American): 13 — ABNORMAL LOW
EGFR (Non-African Amer.): 12 — ABNORMAL LOW
Sodium: 133 mmol/L — ABNORMAL LOW (ref 136–145)

## 2013-09-23 ENCOUNTER — Telehealth: Payer: Self-pay | Admitting: Internal Medicine

## 2013-09-23 ENCOUNTER — Telehealth: Payer: Self-pay

## 2013-09-23 LAB — HEMOGLOBIN: HGB: 7.8 g/dL — ABNORMAL LOW (ref 12.0–16.0)

## 2013-09-23 LAB — TSH: Thyroid Stimulating Horm: 1.05 u[IU]/mL

## 2013-09-23 NOTE — Telephone Encounter (Signed)
[facility] called to set up a ER f/u for 10/07/13

## 2013-09-23 NOTE — Telephone Encounter (Signed)
Sent for records

## 2013-09-23 NOTE — Telephone Encounter (Signed)
Patient contacted regarding discharge from [facility] on 09/23/13.  LMOM

## 2013-09-24 ENCOUNTER — Emergency Department: Payer: Self-pay | Admitting: Emergency Medicine

## 2013-09-24 LAB — BASIC METABOLIC PANEL
BUN: 15 mg/dL (ref 7–18)
Calcium, Total: 7.8 mg/dL — ABNORMAL LOW (ref 8.5–10.1)
Chloride: 94 mmol/L — ABNORMAL LOW (ref 98–107)
Co2: 26 mmol/L (ref 21–32)
Creatinine: 3.18 mg/dL — ABNORMAL HIGH (ref 0.60–1.30)
Glucose: 117 mg/dL — ABNORMAL HIGH (ref 65–99)
Osmolality: 259 (ref 275–301)
Sodium: 128 mmol/L — ABNORMAL LOW (ref 136–145)

## 2013-09-24 LAB — CBC
HGB: 8.2 g/dL — ABNORMAL LOW (ref 12.0–16.0)
MCH: 28.6 pg (ref 26.0–34.0)
MCHC: 33.2 g/dL (ref 32.0–36.0)
MCV: 86 fL (ref 80–100)
Platelet: 185 10*3/uL (ref 150–440)
RBC: 2.87 10*6/uL — ABNORMAL LOW (ref 3.80–5.20)
RDW: 15.6 % — ABNORMAL HIGH (ref 11.5–14.5)
WBC: 10.3 10*3/uL (ref 3.6–11.0)

## 2013-09-24 LAB — CK TOTAL AND CKMB (NOT AT ARMC): CK, Total: 52 U/L (ref 21–215)

## 2013-09-25 DIAGNOSIS — I480 Paroxysmal atrial fibrillation: Secondary | ICD-10-CM | POA: Insufficient documentation

## 2013-09-25 HISTORY — DX: Paroxysmal atrial fibrillation: I48.0

## 2013-09-26 ENCOUNTER — Telehealth: Payer: Self-pay

## 2013-09-26 NOTE — Telephone Encounter (Signed)
Second attempt to contact pt regarding discharge from [facility] on 09/1013.  LMOM.

## 2013-09-27 ENCOUNTER — Telehealth: Payer: Self-pay

## 2013-09-27 DIAGNOSIS — E8770 Fluid overload, unspecified: Secondary | ICD-10-CM | POA: Insufficient documentation

## 2013-09-27 NOTE — Telephone Encounter (Signed)
Patient contacted regarding discharge from [facility] on 09/23/13.  Pt states that got out of the hospital on Friday, but went to [facility] on Monday. She is currently an inpatient at [facility] and states that "Dr. Mariah Milling needs to get in touch with Dr. Darrick Huntsman and let her know what's going on."

## 2013-09-29 LAB — CULTURE, BLOOD (SINGLE)

## 2013-10-03 ENCOUNTER — Encounter: Payer: BC Managed Care – PPO | Admitting: Cardiovascular Disease

## 2013-10-04 ENCOUNTER — Ambulatory Visit: Payer: BC Managed Care – PPO | Admitting: Internal Medicine

## 2013-10-07 ENCOUNTER — Ambulatory Visit: Payer: BC Managed Care – PPO | Admitting: Internal Medicine

## 2013-10-19 ENCOUNTER — Ambulatory Visit: Payer: Self-pay | Admitting: Vascular Surgery

## 2013-10-19 LAB — CBC
MCHC: 33.5 g/dL (ref 32.0–36.0)
Platelet: 241 10*3/uL (ref 150–440)
RBC: 3.83 10*6/uL (ref 3.80–5.20)
RDW: 16.8 % — ABNORMAL HIGH (ref 11.5–14.5)
WBC: 5.8 10*3/uL (ref 3.6–11.0)

## 2013-10-19 LAB — BASIC METABOLIC PANEL
Anion Gap: 7 (ref 7–16)
BUN: 18 mg/dL (ref 7–18)
Calcium, Total: 8.5 mg/dL (ref 8.5–10.1)
Chloride: 101 mmol/L (ref 98–107)
EGFR (Non-African Amer.): 10 — ABNORMAL LOW

## 2013-10-26 ENCOUNTER — Ambulatory Visit: Payer: Self-pay | Admitting: Vascular Surgery

## 2013-10-26 HISTORY — PX: AV FISTULA PLACEMENT: SHX1204

## 2013-11-02 ENCOUNTER — Ambulatory Visit: Payer: BC Managed Care – PPO | Admitting: Cardiovascular Disease

## 2013-11-04 ENCOUNTER — Encounter: Payer: Self-pay | Admitting: Cardiovascular Disease

## 2013-11-04 ENCOUNTER — Ambulatory Visit (INDEPENDENT_AMBULATORY_CARE_PROVIDER_SITE_OTHER): Payer: BC Managed Care – PPO | Admitting: Cardiovascular Disease

## 2013-11-04 VITALS — BP 145/106 | HR 93 | Ht 70.0 in | Wt 119.0 lb

## 2013-11-04 DIAGNOSIS — N189 Chronic kidney disease, unspecified: Secondary | ICD-10-CM

## 2013-11-04 DIAGNOSIS — D631 Anemia in chronic kidney disease: Secondary | ICD-10-CM

## 2013-11-04 DIAGNOSIS — I10 Essential (primary) hypertension: Secondary | ICD-10-CM

## 2013-11-04 DIAGNOSIS — E785 Hyperlipidemia, unspecified: Secondary | ICD-10-CM

## 2013-11-04 DIAGNOSIS — N186 End stage renal disease: Secondary | ICD-10-CM

## 2013-11-04 DIAGNOSIS — I052 Rheumatic mitral stenosis with insufficiency: Secondary | ICD-10-CM

## 2013-11-04 MED ORDER — FUROSEMIDE 40 MG PO TABS: 40.0000 mg | ORAL_TABLET | Freq: Two times a day (BID) | ORAL | Status: DC | PRN

## 2013-11-04 NOTE — Assessment & Plan Note (Signed)
Recent echocardiogram at [facility] October 2014 suggesting mild MS, moderate MR. This may have improved with dialysis.

## 2013-11-04 NOTE — Patient Instructions (Addendum)
Ask Dr. Thedore Mins about changing aspirin 325 mg daily over to aspirin 81 mg daily with plavix 75 mg daily  Suggest to Dr. Thedore Mins that you might need a lower dry weight (maybe 1 kg less) Leg edema, cough, pleural effusion, blood pressure up,  more SOB, fullness in chest  In the meantime, ok to increase lasix to 40 mg twice a day  Please call us if you have new issues that need to be addressed before your next appt.  Your physician wants you to follow-up in: 3 month.  You will receive a reminder letter in the mail two months in advance. If you don't receive a letter, please call our office to schedule the follow-up appointment.

## 2013-11-04 NOTE — Assessment & Plan Note (Signed)
Recent symptoms of edema, pleural effusion, fluid overload, could be secondary to underlying anemia. She reports that she has blood work done with the renal service.

## 2013-11-04 NOTE — Assessment & Plan Note (Signed)
Blood pressure is well controlled on today's visit. No changes made to the medications. 

## 2013-11-04 NOTE — Assessment & Plan Note (Signed)
No recent lipid panel available. Previously was very high. Labs may have been done by the renal service

## 2013-11-04 NOTE — Progress Notes (Signed)
Patient ID: Dorothy Long, female    DOB: 1961-02-09, 52 y.o.   MRN: [id number]  HPI Comments: Ms. 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  In followup today, she reports that she has a cough that is dry, hacking, edema around her ankles amount a fullness in her chest. She feels that she has fluid in her system that needs to come out. She continues to have this feeling after hemodialysis. She's had this for the past several weeks. By her report, her dry weight has not changed it is 55.4 kg.  She has a recently placed AV fistula upper extremity (11/07/13)  Hospital notes indicate severe anemia on October sixth 2014 Admission to the hospital 09/21/2013 for atrial fibrillation in the setting of diarrhea  Echocardiogram 09/26/2013 at [facility]  Low normal left ventricular systolic function, diastolic dysfunction, Biatrial enlargement Mild aortic regurgitation The mitral leaflets are thickened and appear rheumaticically deformed. There is heavy mitral annular calcification. The there is mild mitral stenosis with a calculated valve area of 1.6 cm2 by pressure half-time equation and moderate, eccentric mitral regurgitation Mild to moderate pulmonary hypertension   Stress test 09/28/13 IMPRESSION:  1. No convincing evidence of pharmacologically induced myocardial ischemia.  2. Apparent anterior apical defect that is present only on attenuation corrected images is favored to reflect artifact due to overcorrection and may be worse on stress imaging secondary to greater patient motion. A small area of ischemia is felt less likely but is difficult to  entirely exclude.  3. LVEF = 55%.  Notes indicate she had biopsy done one year ago showing focal segmental glomerulosclerosis at [facility] and saw Dr. Wynelle Link one year ago. Creatinine at that time was 1.8 In the hospital recently creatinine 5-6  Ultrasound in the hospital showed atrophic right kidney, very thin renal cortex, biopsy could not be done successfully She was started on steroids by Dr. Thedore Mins plan to discharge home to finish her steroid course. She was given Neupogen while in the hospital It was recommended that she stop smoking.  EKG shows normal sinus rhythm with  poor R-wave progression through the anterior precordial leads   Outpatient Encounter Prescriptions as of 11/04/2013  Medication Sig  . acetaminophen (TYLENOL) 325 MG tablet Take by mouth.  Marland Kitchen albuterol (PROAIR HFA) 108 (90 BASE) MCG/ACT inhaler Inhale into the lungs.  . ALPRAZolam (XANAX) 0.25 MG tablet Take 0.25 mg by mouth 3 (three) times daily as needed for sleep.  Marland Kitchen aspirin 325 MG tablet Take by mouth.  . Calcium Acetate 667 MG TABS Take 3 capsules by mouth 3 (three) times daily.  Marland Kitchen dexlansoprazole (DEXILANT) 60 MG capsule Take 60 mg by mouth daily as needed.  . diltiazem (CARDIZEM CD) 240 MG 24 hr capsule Take by mouth.  . diphenhydrAMINE (BENADRYL) 25 MG tablet Take 25 mg by mouth every other day. MON, WED, FRI BEFORE DIALYSIS  . ferrous sulfate 325 (65 FE) MG tablet Take 325 mg by mouth at bedtime.  . furosemide (LASIX) 40 MG tablet Take 1 tablet (40 mg total) by mouth 2 (two) times daily as needed.  Marland Kitchen HYDROcodone-acetaminophen (NORCO/VICODIN) 5-325 MG per tablet Take 1 tablet by mouth every 6 (six)  hours as needed for moderate pain.  . valsartan (DIOVAN) 80 MG tablet Take 40 mg by mouth daily.     Review of Systems  Constitutional: Negative.   HENT: Negative.   Eyes: Negative.   Respiratory: Positive for shortness of breath.   Cardiovascular: Negative.   Gastrointestinal: Negative.   Musculoskeletal:  Negative.   Skin: Negative.   Neurological: Negative.   Psychiatric/Behavioral: Negative.   All other systems reviewed and are negative.    BP 145/106  Pulse 93  Ht 5\' 10"  (1.778 m)  Wt 119 lb (53.978 kg)  BMI 17.07 kg/m2  Physical Exam  Nursing note and vitals reviewed. Constitutional: She is oriented to person, place, and time. She appears well-developed and well-nourished.  HENT:  Head: Normocephalic.  Nose: Nose normal.  Mouth/Throat: Oropharynx is clear and moist.  Eyes: Conjunctivae are normal. Pupils are equal, round, and reactive to light.  Neck: Normal range of motion. Neck supple. No JVD present.  Cardiovascular: Normal rate, regular rhythm, S1 normal, S2 normal and intact distal pulses.  Exam reveals no gallop and no friction rub.   Murmur heard.  Systolic murmur is present with a grade of 3/6   Diastolic murmur is present  Pulmonary/Chest: Effort normal and breath sounds normal. No respiratory distress. She has no wheezes. She has no rales. She exhibits no tenderness.  Abdominal: Soft. Bowel sounds are normal. She exhibits no distension. There is no tenderness.  Musculoskeletal: Normal range of motion. She exhibits no edema and no tenderness.  Lymphadenopathy:    She has no cervical adenopathy.  Neurological: She is alert and oriented to person, place, and time. Coordination normal.  Skin: Skin is warm and dry. No rash noted. No erythema.  Psychiatric: She has a normal mood and affect. Her behavior is normal. Judgment and thought content normal.    Assessment and Plan

## 2013-11-04 NOTE — Assessment & Plan Note (Signed)
She has symptoms of fluid overload. Edema on clinical exam today around her ankles, pleural effusion at the right base, hacking cough, fullness in her chest. We have suggested she increase her Lasix up to 40 mg twice a day and discuss this with Dr. Thedore Mins. She may benefit from a lower dry weight

## 2013-11-07 ENCOUNTER — Telehealth: Payer: Self-pay | Admitting: *Deleted

## 2013-11-07 NOTE — Telephone Encounter (Signed)
error 

## 2013-11-30 ENCOUNTER — Encounter: Payer: Self-pay | Admitting: *Deleted

## 2013-12-21 LAB — HM MAMMOGRAPHY: HM Mammogram: NORMAL

## 2013-12-23 DIAGNOSIS — Z7682 Awaiting organ transplant status: Secondary | ICD-10-CM | POA: Insufficient documentation

## 2013-12-23 DIAGNOSIS — N2581 Secondary hyperparathyroidism of renal origin: Secondary | ICD-10-CM | POA: Insufficient documentation

## 2014-01-07 ENCOUNTER — Observation Stay: Payer: Self-pay | Admitting: Internal Medicine

## 2014-01-07 LAB — BASIC METABOLIC PANEL
Anion Gap: 10 (ref 7–16)
BUN: 25 mg/dL — ABNORMAL HIGH (ref 7–18)
CHLORIDE: 95 mmol/L — AB (ref 98–107)
Calcium, Total: 9.2 mg/dL (ref 8.5–10.1)
Co2: 24 mmol/L (ref 21–32)
Creatinine: 5.86 mg/dL — ABNORMAL HIGH (ref 0.60–1.30)
EGFR (African American): 9 — ABNORMAL LOW
EGFR (Non-African Amer.): 8 — ABNORMAL LOW
Glucose: 126 mg/dL — ABNORMAL HIGH (ref 65–99)
OSMOLALITY: 265 (ref 275–301)
Potassium: 3.8 mmol/L (ref 3.5–5.1)
Sodium: 129 mmol/L — ABNORMAL LOW (ref 136–145)

## 2014-01-07 LAB — PRO B NATRIURETIC PEPTIDE: B-TYPE NATIURETIC PEPTID: 27130 pg/mL — AB (ref 0–125)

## 2014-01-07 LAB — CBC
HCT: 32.1 % — ABNORMAL LOW (ref 35.0–47.0)
HGB: 10.6 g/dL — ABNORMAL LOW (ref 12.0–16.0)
MCH: 29.8 pg (ref 26.0–34.0)
MCHC: 33.1 g/dL (ref 32.0–36.0)
MCV: 90 fL (ref 80–100)
Platelet: 198 10*3/uL (ref 150–440)
RBC: 3.58 10*6/uL — AB (ref 3.80–5.20)
RDW: 18.7 % — AB (ref 11.5–14.5)
WBC: 5.9 10*3/uL (ref 3.6–11.0)

## 2014-01-07 LAB — TROPONIN I
TROPONIN-I: 0.06 ng/mL — AB
Troponin-I: 0.07 ng/mL — ABNORMAL HIGH
Troponin-I: 0.08 ng/mL — ABNORMAL HIGH

## 2014-01-07 LAB — PHOSPHORUS: Phosphorus: 6.7 mg/dL — ABNORMAL HIGH (ref 2.5–4.9)

## 2014-01-08 LAB — CBC WITH DIFFERENTIAL/PLATELET
Basophil #: 0 10*3/uL (ref 0.0–0.1)
Basophil %: 1 %
EOS ABS: 0.1 10*3/uL (ref 0.0–0.7)
Eosinophil %: 1.8 %
HCT: 29.1 % — AB (ref 35.0–47.0)
HGB: 9.5 g/dL — AB (ref 12.0–16.0)
LYMPHS PCT: 36.3 %
Lymphocyte #: 1.5 10*3/uL (ref 1.0–3.6)
MCH: 29.6 pg (ref 26.0–34.0)
MCHC: 32.6 g/dL (ref 32.0–36.0)
MCV: 91 fL (ref 80–100)
MONO ABS: 0.5 x10 3/mm (ref 0.2–0.9)
Monocyte %: 11 %
Neutrophil #: 2.1 10*3/uL (ref 1.4–6.5)
Neutrophil %: 49.9 %
PLATELETS: 116 10*3/uL — AB (ref 150–440)
RBC: 3.21 10*6/uL — AB (ref 3.80–5.20)
RDW: 18.4 % — AB (ref 11.5–14.5)
WBC: 4.2 10*3/uL (ref 3.6–11.0)

## 2014-01-10 ENCOUNTER — Ambulatory Visit: Payer: Self-pay

## 2014-01-11 ENCOUNTER — Encounter: Payer: Self-pay | Admitting: *Deleted

## 2014-01-11 ENCOUNTER — Encounter: Payer: Self-pay | Admitting: Internal Medicine

## 2014-01-11 ENCOUNTER — Ambulatory Visit (INDEPENDENT_AMBULATORY_CARE_PROVIDER_SITE_OTHER): Payer: BC Managed Care – PPO | Admitting: Internal Medicine

## 2014-01-11 VITALS — BP 136/100 | HR 100 | Temp 98.3°F | Resp 12 | Wt 121.0 lb

## 2014-01-11 DIAGNOSIS — Z1211 Encounter for screening for malignant neoplasm of colon: Secondary | ICD-10-CM

## 2014-01-11 DIAGNOSIS — I10 Essential (primary) hypertension: Secondary | ICD-10-CM

## 2014-01-11 DIAGNOSIS — N186 End stage renal disease: Secondary | ICD-10-CM

## 2014-01-11 DIAGNOSIS — F17200 Nicotine dependence, unspecified, uncomplicated: Secondary | ICD-10-CM

## 2014-01-11 DIAGNOSIS — Z716 Tobacco abuse counseling: Secondary | ICD-10-CM

## 2014-01-11 DIAGNOSIS — Z7189 Other specified counseling: Secondary | ICD-10-CM

## 2014-01-11 MED ORDER — LACTULOSE 20 GM/30ML PO SOLN: 30.0000 mL | Freq: Four times a day (QID) | ORAL | Status: DC | PRN

## 2014-01-11 NOTE — Assessment & Plan Note (Signed)
Now receiving hemodialysis and undergoing evaluation for renal transplant/

## 2014-01-11 NOTE — Assessment & Plan Note (Signed)
Well controlled on current regimen. Renal function improving, no changes today.

## 2014-01-11 NOTE — Patient Instructions (Addendum)
Try increasing the furosemide to 80 mg in the morning If that does not increase your urine output,  Call for a different antibiotic   Trial of miralax for your constipation   Lactulose once or twice weekly for severe constipation  Try flushing sinuses twice daily with sterile nasal saline solution  Try adding humidifier to bedroom   Return for PAP smear.

## 2014-01-11 NOTE — Progress Notes (Signed)
Pre-visit discussion using our clinic review tool. No additional management support is needed unless otherwise documented below in the visit note.  

## 2014-01-11 NOTE — Assessment & Plan Note (Signed)
Smoking cessation instruction/counseling given:  counseled patient on the dangers of tobacco use, advised patient to stop smoking, and reviewed strategies to maximize success 

## 2014-01-11 NOTE — Progress Notes (Signed)
Patient ID: Dorothy Long, female   DOB: 1961/10/16, 53 y.o.   MRN: [id number]  Patient Active Problem List   Diagnosis Date Noted  . GERD (gastroesophageal reflux disease) 08/12/2013  . Mitral stenosis with incompetence or regurgitation 07/19/2013  . Anemia in chronic kidney disease 07/19/2013  . End stage kidney disease 07/14/2013  . Tobacco abuse counseling 12/20/2012  . Vitamin D deficiency 07/22/2012  . Hyperlipidemia   . Hypertension 07/21/2012  . Family history of colon cancer 07/21/2012  . Tobacco abuse 07/21/2012    Subjective:  CC:   Chief Complaint  Patient presents with  . Follow-up    HPI:   Dorothy Ratliffis a 53 y.o. female who presents for folow up after being  Hospitalized two weeks ago for left scapular and thoracic pain accompanied by orthopnea and strangling cough .  She was dialyzed in house , ruled out  For PE , was not treated for pneumonia.  Went home Sunday,  Pain has resolved,  Cough is infrequent and dry  Now on hemodialysis for renal failure.  She is glad she made the decision to pursue hemodialysis.  She is on the transplant list at [facility] ad has been referred for PAP smear and colonoscopy in inticipation of his renal transplant .  Has already had her mammogram which was normal.   Craving water all the time not following a water restricted diet    Past Medical History  Diagnosis Date  . Hypertension   . Hyperlipidemia   . COPD (chronic obstructive pulmonary disease)   . Anemia   . Mitral valve stenosis, severe   . CHF (congestive heart failure)   . Chronic kidney disease   . Acute on chronic renal failure   . Diastolic congestive heart failure     Past Surgical History  Procedure Laterality Date  . Abdominal hysterectomy    . Cholecystectomy    . Knee surgery Right   . Foot surgery Bilateral   . Av fistula placement         The following portions of the patient's history were reviewed and updated as appropriate: Allergies, current  medications, and problem list.    Review of Systems:   12 Pt  review of systems was negative except those addressed in the HPI,     History   Social History  . Marital Status: Married    Spouse Name: N/A    Number of Children: N/A  . Years of Education: N/A   Occupational History  . Not on file.   Social History Main Topics  . Smoking status: Current Some Day Smoker -- 0.25 packs/day for 20 years    Types: Cigarettes  . Smokeless tobacco: Never Used  . Alcohol Use: No     Comment: occasional  . Drug Use: No  . Sexual Activity: Not on file   Other Topics Concern  . Not on file   Social History Narrative  . No narrative on file    Objective:  Filed Vitals:   01/11/14 0824  BP: 136/100  Pulse: 100  Temp: 98.3 F (36.8 C)  Resp: 12     General appearance: alert, cooperative and appears stated age Ears: normal TM's and external ear canals both ears Throat: lips, mucosa, and tongue normal; teeth and gums normal Neck: no adenopathy, no carotid bruit, supple, symmetrical, trachea midline and thyroid not enlarged, symmetric, no tenderness/mass/nodules Back: symmetric, no curvature. ROM normal. No CVA tenderness. Lungs: clear to auscultation bilaterally Heart: regular rate  and rhythm, S1, S2 normal, no murmur, click, rub or gallop Abdomen: soft, non-tender; bowel sounds normal; no masses,  no organomegaly Pulses: 2+ and symmetric Skin: Skin color, texture, turgor normal. No rashes or lesions Lymph nodes: Cervical, supraclavicular, and axillary nodes normal.  Assessment and Plan:  End stage kidney disease With recent hospitalization for pulmonary edema, Now receiving hemodialysis and undergoing evaluation for renal transplant/  Hypertension Well controlled on current regimen. Renal function improving, no changes today.    Tobacco abuse counseling Smoking cessation instruction/counseling given:  counseled patient on the dangers of tobacco use, advised  patient to stop smoking, and reviewed strategies to maximize success   Updated Medication List Outpatient Encounter Prescriptions as of 01/11/2014  Medication Sig  . acetaminophen (TYLENOL) 325 MG tablet Take by mouth.  . ALPRAZolam (XANAX) 0.25 MG tablet Take 0.25 mg by mouth 3 (three) times daily as needed for sleep.  Marland Kitchen. dexlansoprazole (DEXILANT) 60 MG capsule Take 60 mg by mouth daily as needed.  . furosemide (LASIX) 40 MG tablet Take 1 tablet (40 mg total) by mouth 2 (two) times daily as needed.  Marland Kitchen. HYDROcodone-acetaminophen (NORCO/VICODIN) 5-325 MG per tablet Take 1 tablet by mouth every 6 (six) hours as needed for moderate pain.  Marland Kitchen. albuterol (PROAIR HFA) 108 (90 BASE) MCG/ACT inhaler Inhale into the lungs.  Marland Kitchen. amLODipine (NORVASC) 5 MG tablet   . aspirin 325 MG tablet Take by mouth.  . Calcium Acetate 667 MG TABS Take 3 capsules by mouth 3 (three) times daily.  Marland Kitchen. diltiazem (CARDIZEM CD) 240 MG 24 hr capsule Take by mouth.  . diphenhydrAMINE (BENADRYL) 25 MG tablet Take 25 mg by mouth every other day. MON, WED, FRI BEFORE DIALYSIS  . ferrous sulfate 325 (65 FE) MG tablet Take 325 mg by mouth at bedtime.  . Lactulose 20 GM/30ML SOLN Take 30 mLs (20 g total) by mouth every 6 (six) hours as needed. To relieve constipation  . valsartan (DIOVAN) 80 MG tablet Take 40 mg by mouth daily.      Orders Placed This Encounter  Procedures  . Ambulatory referral to General Surgery    No Follow-up on file.

## 2014-01-12 ENCOUNTER — Telehealth: Payer: Self-pay | Admitting: Internal Medicine

## 2014-01-12 NOTE — Telephone Encounter (Signed)
Relevant patient education mailed to patient.  

## 2014-01-13 ENCOUNTER — Ambulatory Visit: Payer: BC Managed Care – PPO | Admitting: Internal Medicine

## 2014-01-16 ENCOUNTER — Ambulatory Visit: Payer: Self-pay | Admitting: Physician Assistant

## 2014-01-16 ENCOUNTER — Telehealth: Payer: Self-pay | Admitting: Internal Medicine

## 2014-01-16 NOTE — Telephone Encounter (Signed)
Relevant patient education mailed to patient.  

## 2014-01-18 ENCOUNTER — Inpatient Hospital Stay: Payer: Self-pay | Admitting: Internal Medicine

## 2014-01-18 LAB — CBC
HCT: 29.2 % — ABNORMAL LOW (ref 35.0–47.0)
HGB: 9.7 g/dL — AB (ref 12.0–16.0)
MCH: 29.7 pg (ref 26.0–34.0)
MCHC: 33.2 g/dL (ref 32.0–36.0)
MCV: 90 fL (ref 80–100)
PLATELETS: 187 10*3/uL (ref 150–440)
RBC: 3.26 10*6/uL — ABNORMAL LOW (ref 3.80–5.20)
RDW: 18.1 % — ABNORMAL HIGH (ref 11.5–14.5)
WBC: 10.1 10*3/uL (ref 3.6–11.0)

## 2014-01-18 LAB — COMPREHENSIVE METABOLIC PANEL
ALT: 27 U/L (ref 12–78)
Albumin: 2.8 g/dL — ABNORMAL LOW (ref 3.4–5.0)
Alkaline Phosphatase: 95 U/L
Anion Gap: 9 (ref 7–16)
BILIRUBIN TOTAL: 0.7 mg/dL (ref 0.2–1.0)
BUN: 25 mg/dL — AB (ref 7–18)
CALCIUM: 8.8 mg/dL (ref 8.5–10.1)
CHLORIDE: 95 mmol/L — AB (ref 98–107)
CO2: 24 mmol/L (ref 21–32)
Creatinine: 5.13 mg/dL — ABNORMAL HIGH (ref 0.60–1.30)
GFR CALC AF AMER: 11 — AB
GFR CALC NON AF AMER: 9 — AB
Glucose: 104 mg/dL — ABNORMAL HIGH (ref 65–99)
Osmolality: 262 (ref 275–301)
POTASSIUM: 4 mmol/L (ref 3.5–5.1)
SGOT(AST): 37 U/L (ref 15–37)
Sodium: 128 mmol/L — ABNORMAL LOW (ref 136–145)
Total Protein: 6.5 g/dL (ref 6.4–8.2)

## 2014-01-18 LAB — TROPONIN I: TROPONIN-I: 0.04 ng/mL

## 2014-01-19 LAB — BASIC METABOLIC PANEL
Anion Gap: 8 (ref 7–16)
BUN: 26 mg/dL — ABNORMAL HIGH (ref 7–18)
CO2: 25 mmol/L (ref 21–32)
Calcium, Total: 9.1 mg/dL (ref 8.5–10.1)
Chloride: 97 mmol/L — ABNORMAL LOW (ref 98–107)
Creatinine: 5.58 mg/dL — ABNORMAL HIGH (ref 0.60–1.30)
EGFR (African American): 10 — ABNORMAL LOW
EGFR (Non-African Amer.): 8 — ABNORMAL LOW
GLUCOSE: 100 mg/dL — AB (ref 65–99)
Osmolality: 266 (ref 275–301)
POTASSIUM: 4.3 mmol/L (ref 3.5–5.1)
Sodium: 130 mmol/L — ABNORMAL LOW (ref 136–145)

## 2014-01-19 LAB — CBC WITH DIFFERENTIAL/PLATELET
Basophil #: 0.1 10*3/uL (ref 0.0–0.1)
Basophil %: 0.8 %
EOS PCT: 0.1 %
Eosinophil #: 0 10*3/uL (ref 0.0–0.7)
HCT: 28.7 % — AB (ref 35.0–47.0)
HGB: 9.5 g/dL — AB (ref 12.0–16.0)
LYMPHS ABS: 1 10*3/uL (ref 1.0–3.6)
Lymphocyte %: 8.9 %
MCH: 29.9 pg (ref 26.0–34.0)
MCHC: 33.2 g/dL (ref 32.0–36.0)
MCV: 90 fL (ref 80–100)
Monocyte #: 1.4 x10 3/mm — ABNORMAL HIGH (ref 0.2–0.9)
Monocyte %: 12.4 %
Neutrophil #: 8.4 10*3/uL — ABNORMAL HIGH (ref 1.4–6.5)
Neutrophil %: 77.8 %
PLATELETS: 172 10*3/uL (ref 150–440)
RBC: 3.18 10*6/uL — ABNORMAL LOW (ref 3.80–5.20)
RDW: 18.2 % — ABNORMAL HIGH (ref 11.5–14.5)
WBC: 10.9 10*3/uL (ref 3.6–11.0)

## 2014-01-19 LAB — PHOSPHORUS: PHOSPHORUS: 5.9 mg/dL — AB (ref 2.5–4.9)

## 2014-01-19 LAB — TSH: THYROID STIMULATING HORM: 1.24 u[IU]/mL

## 2014-01-19 LAB — MAGNESIUM: MAGNESIUM: 1.7 mg/dL — AB

## 2014-01-20 LAB — CBC WITH DIFFERENTIAL/PLATELET
BASOS PCT: 0.5 %
Basophil #: 0 10*3/uL (ref 0.0–0.1)
EOS ABS: 0 10*3/uL (ref 0.0–0.7)
Eosinophil %: 0.2 %
HCT: 27.5 % — ABNORMAL LOW (ref 35.0–47.0)
HGB: 9.3 g/dL — AB (ref 12.0–16.0)
Lymphocyte #: 1.2 10*3/uL (ref 1.0–3.6)
Lymphocyte %: 11.8 %
MCH: 30.2 pg (ref 26.0–34.0)
MCHC: 33.8 g/dL (ref 32.0–36.0)
MCV: 89 fL (ref 80–100)
MONO ABS: 1.4 x10 3/mm — AB (ref 0.2–0.9)
Monocyte %: 13.1 %
NEUTROS ABS: 7.9 10*3/uL — AB (ref 1.4–6.5)
Neutrophil %: 74.4 %
Platelet: 148 10*3/uL — ABNORMAL LOW (ref 150–440)
RBC: 3.08 10*6/uL — AB (ref 3.80–5.20)
RDW: 17.9 % — AB (ref 11.5–14.5)
WBC: 10.6 10*3/uL (ref 3.6–11.0)

## 2014-01-20 LAB — BASIC METABOLIC PANEL
Anion Gap: 5 — ABNORMAL LOW (ref 7–16)
BUN: 21 mg/dL — AB (ref 7–18)
CO2: 30 mmol/L (ref 21–32)
CREATININE: 4.37 mg/dL — AB (ref 0.60–1.30)
Calcium, Total: 9 mg/dL (ref 8.5–10.1)
Chloride: 93 mmol/L — ABNORMAL LOW (ref 98–107)
EGFR (African American): 13 — ABNORMAL LOW
EGFR (Non-African Amer.): 11 — ABNORMAL LOW
Glucose: 78 mg/dL (ref 65–99)
Osmolality: 259 (ref 275–301)
Potassium: 4.1 mmol/L (ref 3.5–5.1)
Sodium: 128 mmol/L — ABNORMAL LOW (ref 136–145)

## 2014-01-21 LAB — RENAL FUNCTION PANEL
Albumin: 2.2 g/dL — ABNORMAL LOW (ref 3.4–5.0)
Anion Gap: 6 — ABNORMAL LOW (ref 7–16)
BUN: 35 mg/dL — AB (ref 7–18)
Calcium, Total: 8.7 mg/dL (ref 8.5–10.1)
Chloride: 90 mmol/L — ABNORMAL LOW (ref 98–107)
Co2: 29 mmol/L (ref 21–32)
Creatinine: 5.72 mg/dL — ABNORMAL HIGH (ref 0.60–1.30)
EGFR (African American): 9 — ABNORMAL LOW
EGFR (Non-African Amer.): 8 — ABNORMAL LOW
Glucose: 82 mg/dL (ref 65–99)
OSMOLALITY: 259 (ref 275–301)
Phosphorus: 4.2 mg/dL (ref 2.5–4.9)
Potassium: 4.1 mmol/L (ref 3.5–5.1)
Sodium: 125 mmol/L — ABNORMAL LOW (ref 136–145)

## 2014-01-21 LAB — CBC WITH DIFFERENTIAL/PLATELET
Basophil #: 0 10*3/uL (ref 0.0–0.1)
Basophil %: 0.5 %
Eosinophil #: 0.1 10*3/uL (ref 0.0–0.7)
Eosinophil %: 1.1 %
HCT: 25.4 % — AB (ref 35.0–47.0)
HGB: 8.7 g/dL — AB (ref 12.0–16.0)
Lymphocyte #: 0.9 10*3/uL — ABNORMAL LOW (ref 1.0–3.6)
Lymphocyte %: 12.8 %
MCH: 30.5 pg (ref 26.0–34.0)
MCHC: 34.4 g/dL (ref 32.0–36.0)
MCV: 89 fL (ref 80–100)
MONOS PCT: 11.5 %
Monocyte #: 0.8 x10 3/mm (ref 0.2–0.9)
NEUTROS PCT: 74.1 %
Neutrophil #: 5 10*3/uL (ref 1.4–6.5)
PLATELETS: 147 10*3/uL — AB (ref 150–440)
RBC: 2.87 10*6/uL — ABNORMAL LOW (ref 3.80–5.20)
RDW: 17.5 % — AB (ref 11.5–14.5)
WBC: 6.8 10*3/uL (ref 3.6–11.0)

## 2014-01-21 LAB — HM PAP SMEAR: HM Pap smear: NORMAL

## 2014-01-22 LAB — BASIC METABOLIC PANEL
Anion Gap: 6 — ABNORMAL LOW (ref 7–16)
BUN: 16 mg/dL (ref 7–18)
CHLORIDE: 93 mmol/L — AB (ref 98–107)
Calcium, Total: 9.2 mg/dL (ref 8.5–10.1)
Co2: 32 mmol/L (ref 21–32)
Creatinine: 4.1 mg/dL — ABNORMAL HIGH (ref 0.60–1.30)
EGFR (African American): 14 — ABNORMAL LOW
GFR CALC NON AF AMER: 12 — AB
GLUCOSE: 92 mg/dL (ref 65–99)
Osmolality: 263 (ref 275–301)
POTASSIUM: 3.9 mmol/L (ref 3.5–5.1)
SODIUM: 131 mmol/L — AB (ref 136–145)

## 2014-01-23 LAB — CULTURE, BLOOD (SINGLE)

## 2014-01-25 ENCOUNTER — Encounter: Payer: Self-pay | Admitting: Internal Medicine

## 2014-01-26 ENCOUNTER — Ambulatory Visit: Payer: Self-pay | Admitting: General Surgery

## 2014-01-30 ENCOUNTER — Ambulatory Visit: Payer: Self-pay | Admitting: General Surgery

## 2014-02-01 ENCOUNTER — Other Ambulatory Visit (HOSPITAL_COMMUNITY)
Admission: RE | Admit: 2014-02-01 | Discharge: 2014-02-01 | Disposition: A | Discharge status: Home / Self Care | Payer: BC Managed Care – PPO | Source: Ambulatory Visit | Attending: Internal Medicine | Admitting: Internal Medicine

## 2014-02-01 ENCOUNTER — Encounter: Payer: Self-pay | Admitting: Internal Medicine

## 2014-02-01 ENCOUNTER — Ambulatory Visit (INDEPENDENT_AMBULATORY_CARE_PROVIDER_SITE_OTHER): Payer: BC Managed Care – PPO | Admitting: Internal Medicine

## 2014-02-01 VITALS — BP 126/74 | HR 75 | Temp 97.7°F | Resp 16 | Ht 69.0 in | Wt 124.8 lb

## 2014-02-01 DIAGNOSIS — Z01419 Encounter for gynecological examination (general) (routine) without abnormal findings: Secondary | ICD-10-CM | POA: Insufficient documentation

## 2014-02-01 DIAGNOSIS — N186 End stage renal disease: Secondary | ICD-10-CM

## 2014-02-01 DIAGNOSIS — Z124 Encounter for screening for malignant neoplasm of cervix: Secondary | ICD-10-CM

## 2014-02-01 DIAGNOSIS — N760 Acute vaginitis: Secondary | ICD-10-CM

## 2014-02-01 DIAGNOSIS — Z800 Family history of malignant neoplasm of digestive organs: Secondary | ICD-10-CM

## 2014-02-01 DIAGNOSIS — Z113 Encounter for screening for infections with a predominantly sexual mode of transmission: Secondary | ICD-10-CM

## 2014-02-01 DIAGNOSIS — Z23 Encounter for immunization: Secondary | ICD-10-CM

## 2014-02-01 DIAGNOSIS — Z2911 Encounter for prophylactic immunotherapy for respiratory syncytial virus (RSV): Secondary | ICD-10-CM

## 2014-02-01 DIAGNOSIS — I10 Essential (primary) hypertension: Secondary | ICD-10-CM

## 2014-02-01 DIAGNOSIS — Z1151 Encounter for screening for human papillomavirus (HPV): Secondary | ICD-10-CM | POA: Insufficient documentation

## 2014-02-01 DIAGNOSIS — Z0000 Encounter for general adult medical examination without abnormal findings: Secondary | ICD-10-CM

## 2014-02-01 DIAGNOSIS — M549 Dorsalgia, unspecified: Secondary | ICD-10-CM

## 2014-02-01 DIAGNOSIS — Z1239 Encounter for other screening for malignant neoplasm of breast: Secondary | ICD-10-CM

## 2014-02-01 LAB — POCT URINALYSIS DIPSTICK
Bilirubin, UA: NEGATIVE
GLUCOSE UA: NEGATIVE
Ketones, UA: NEGATIVE
Nitrite, UA: NEGATIVE
Protein, UA: >=300
Spec Grav, UA: 1.02
Urobilinogen, UA: 0.2
pH, UA: 7

## 2014-02-01 NOTE — Assessment & Plan Note (Signed)
Patient is s/p TAH and had no cervix on exam. PAP done of vaginal cuff.

## 2014-02-01 NOTE — Assessment & Plan Note (Signed)
Dr. Evette CristalSankar to do her screening colonoscopy ASAP

## 2014-02-01 NOTE — Addendum Note (Signed)
Addended by: Dennie BibleAVIS, KATHY R on: 02/01/2014 05:18 PM   Modules accepted: Orders

## 2014-02-01 NOTE — Progress Notes (Signed)
Pre-visit discussion using our clinic review tool. No additional management support is needed unless otherwise documented below in the visit note.  

## 2014-02-01 NOTE — Progress Notes (Signed)
Patient ID: Tanae Petrosky, female   DOB: December 05, 1961, 53 y.o.   MRN: [id number]  Patient Active Problem List   Diagnosis Date Noted  . Screening for STD (sexually transmitted disease) 02/01/2014  . Screening for cervical cancer 02/01/2014  . Screening for breast cancer 02/01/2014  . GERD (gastroesophageal reflux disease) 08/12/2013  . Mitral stenosis with incompetence or regurgitation 07/19/2013  . Anemia in chronic kidney disease 07/19/2013  . End stage kidney disease 07/14/2013  . Tobacco abuse counseling 12/20/2012  . Vitamin D deficiency 07/22/2012  . Hyperlipidemia   . Hypertension 07/21/2012  . Family history of colon cancer 07/21/2012  . Tobacco abuse 07/21/2012    Subjective:  CC:   Chief Complaint  Patient presents with  . Annual Exam    neds pap stated by patient.    HPI:   Darnisha Vernet is a 53 y.o. female who presents for Hospital follow up and for annual PE.  She has had two hospitalizations in the past month, first one Jan 24/25 for chf with pleural effusions.  2nd one was from Feb 5 to 10 for pneumonia with pleurisy.  Discharged home on abx ,  Having some vaginal itching wonders if she has candida. Had dialysis yesterday per regular schedule  In [place],  T H and Saturday. Porta cath was removed two weeks ago.  Still wearing the dressing from 2 weeks ago.  Has been noting abdominal distension and weight gain of 1 lb on non dialysis days .  Latiffe started her on furosemide 80 mg twice daily after dialysis yesterday bc od recurrent weight gain and she is voiding 3/day currently.   Had one day of  Diarrhea after discharge but this was several days ago and stools are back to normal.  Still having Right CVA pain,  worriied she has a UTI.   Needs to complete all screenings prior to being considered for kidney transplant.  S/p abd hysterectomy remotely,  Not sure if she still has a cervix.  Not sexually active recently.  Has GI intolerance to diflucan.    Past  Medical History  Diagnosis Date  . Hypertension   . Hyperlipidemia   . COPD (chronic obstructive pulmonary disease)   . Anemia   . Mitral valve stenosis, severe   . CHF (congestive heart failure)   . Chronic kidney disease   . Acute on chronic renal failure   . Diastolic congestive heart failure     Past Surgical History  Procedure Laterality Date  . Abdominal hysterectomy    . Cholecystectomy    . Knee surgery Right   . Foot surgery Bilateral   . Av fistula placement         The following portions of the patient's history were reviewed and updated as appropriate: Allergies, current medications, and problem list.    Review of Systems:   Patient denies headache, fevers, malaise, unintentional weight loss, skin rash, eye pain, sinus congestion and sinus pain, sore throat, dysphagia,  hemoptysis , cough, dyspnea, wheezing, chest pain, palpitations, orthopnea, edema, abdominal pain, nausea, melena, diarrhea, constipation, flank pain, dysuria, hematuria, urinary  Frequency, nocturia, numbness, tingling, seizures,  Focal weakness, Loss of consciousness,  Tremor, insomnia, depression, anxiety, and suicidal ideation.     History   Social History  . Marital Status: Married    Spouse Name: N/A    Number of Children: N/A  . Years of Education: N/A   Occupational History  . Not on file.   Social History Main  Topics  . Smoking status: Current Some Day Smoker -- 0.25 packs/day for 20 years    Types: Cigarettes  . Smokeless tobacco: Never Used  . Alcohol Use: No     Comment: occasional  . Drug Use: No  . Sexual Activity: Not on file   Other Topics Concern  . Not on file   Social History Narrative  . No narrative on file    Objective:  Filed Vitals:   02/01/14 1036  BP: 126/74  Pulse: 75  Temp: 97.7 F (36.5 C)  Resp: 16    General Appearance:    Alert, cooperative, no distress, appears stated age  Head:    Normocephalic, without obvious abnormality,  atraumatic  Eyes:    PERRL, conjunctiva/corneas clear, EOM's intact, fundi    benign, both eyes  Ears:    Normal TM's and external ear canals, both ears  Nose:   Nares normal, septum midline, mucosa normal, no drainage    or sinus tenderness  Throat:   Lips, mucosa, and tongue normal; teeth and gums normal  Neck:   Supple, symmetrical, trachea midline, no adenopathy;    thyroid:  no enlargement/tenderness/nodules; no carotid   bruit or JVD  Back:     Symmetric, no curvature, ROM normal, no CVA tenderness  Lungs:     Clear to auscultation bilaterally, respirations unlabored  Chest Wall:    No tenderness or deformity   Heart:    Regular rate and rhythm, S1 and S2 normal, no murmur, rub   or gallop  Breast Exam:    No tenderness, masses, or nipple abnormality  Abdomen:     Soft, non-tender, bowel sounds active all four quadrants,    no masses, no organomegaly  Genitalia:    Pelvic: cervix surgically absent,  external genitalia normal, no adnexal masses or tenderness,  rectovaginal septum normal,   Extremities:   Extremities normal, atraumatic, no cyanosis or edema  Pulses:   2+ and symmetric all extremities  Skin:   Skin color, texture, turgor normal, no rashes or lesions  Lymph nodes:   Cervical, supraclavicular, and axillary nodes normal  Neurologic:   CNII-XII intact, normal strength, sensation and reflexes    throughout    Assessment and Plan:   End stage kidney disease NOw receiving HD Tues Thurs and Sat managed by Dr. Cherylann Ratel,  Via AVFistula in arm.  portocath removed,  Using lasix 80 mg bid on nondialysis days for wt gain noted.  Awaiting transplant eligibility per [facility].   Family history of colon cancer Dr. Evette Cristal to do her screening colonoscopy ASAP  Hypertension Well controlled on current regimen. No longer taking divoan, no changes today.  Screening for STD (sexually transmitted disease) GC and wet prep done today during pelvic exam.   Screening for cervical  cancer Patient is s/p TAH and had no cervix on exam. PAP done of vaginal cuff.   Screening for breast cancer Mammogram was normal  Feb 2015.   Encounter for preventive health examination Annual comprehensive exam was done including breast, pelvic and PAP smear. All screenings have been addressed .    Updated Medication List Outpatient Encounter Prescriptions as of 02/01/2014  Medication Sig  . acetaminophen (TYLENOL) 325 MG tablet Take by mouth.  Marland Kitchen albuterol (PROAIR HFA) 108 (90 BASE) MCG/ACT inhaler Inhale into the lungs.  . ALPRAZolam (XANAX) 0.25 MG tablet Take 0.25 mg by mouth 3 (three) times daily as needed for sleep.  Marland Kitchen amLODipine (NORVASC) 5 MG tablet   .  aspirin 325 MG tablet Take by mouth.  . Calcium Acetate 667 MG TABS Take 3 capsules by mouth 3 (three) times daily.  Marland Kitchen. dexlansoprazole (DEXILANT) 60 MG capsule Take 60 mg by mouth daily as needed.  . diltiazem (CARDIZEM CD) 240 MG 24 hr capsule Take by mouth.  . diphenhydrAMINE (BENADRYL) 25 MG tablet Take 25 mg by mouth every other day. MON, WED, FRI BEFORE DIALYSIS  . ferrous sulfate 325 (65 FE) MG tablet Take 325 mg by mouth at bedtime.  . furosemide (LASIX) 40 MG tablet Take 1 tablet (40 mg total) by mouth 2 (two) times daily as needed.  Marland Kitchen. HYDROcodone-acetaminophen (NORCO/VICODIN) 5-325 MG per tablet Take 1 tablet by mouth every 6 (six) hours as needed for moderate pain.  . Lactulose 20 GM/30ML SOLN Take 30 mLs (20 g total) by mouth every 6 (six) hours as needed. To relieve constipation  . [DISCONTINUED] valsartan (DIOVAN) 80 MG tablet Take 40 mg by mouth daily.

## 2014-02-01 NOTE — Assessment & Plan Note (Signed)
Mammogram was normal  Feb 2015.

## 2014-02-01 NOTE — Assessment & Plan Note (Signed)
GC and wet prep done today during pelvic exam.

## 2014-02-01 NOTE — Assessment & Plan Note (Signed)
NOw receiving HD Tues Thurs and Sat managed by Dr. Cherylann RatelLateef,  Via AVFistula in arm.  portocath removed,  Using lasix 80 mg bid on nondialysis days for wt gain noted.  Awaiting transplant eligibility per [facility].

## 2014-02-01 NOTE — Assessment & Plan Note (Signed)
Well controlled on current regimen. No longer taking divoan, no changes today.

## 2014-02-01 NOTE — Assessment & Plan Note (Signed)
Annual comprehensive exam was done including breast, pelvic and PAP smear. All screenings have been addressed .  

## 2014-02-01 NOTE — Patient Instructions (Addendum)
You had your annual PE today  You need to have a TDaP vaccine next week when you return   You received the  Shingles  vaccine today.  We will contact you with the urine and pelvic test results

## 2014-02-02 ENCOUNTER — Telehealth: Payer: Self-pay | Admitting: Internal Medicine

## 2014-02-02 LAB — GC/CHLAMYDIA PROBE AMP
CT PROBE, AMP APTIMA: NEGATIVE
GC Probe RNA: NEGATIVE

## 2014-02-02 LAB — WET PREP BY MOLECULAR PROBE
Candida species: NEGATIVE
Gardnerella vaginalis: NEGATIVE
Trichomonas vaginosis: NEGATIVE

## 2014-02-02 LAB — URINE CULTURE
Colony Count: NO GROWTH
ORGANISM ID, BACTERIA: NO GROWTH

## 2014-02-02 NOTE — Telephone Encounter (Signed)
Relevant patient education mailed to patient.  

## 2014-02-03 ENCOUNTER — Encounter: Payer: Self-pay | Admitting: *Deleted

## 2014-02-06 ENCOUNTER — Encounter: Payer: Self-pay | Admitting: *Deleted

## 2014-02-13 ENCOUNTER — Inpatient Hospital Stay: Payer: Self-pay | Admitting: Specialist

## 2014-02-13 LAB — BASIC METABOLIC PANEL
Anion Gap: 11 (ref 7–16)
BUN: 23 mg/dL — AB (ref 7–18)
CALCIUM: 9.1 mg/dL (ref 8.5–10.1)
CO2: 24 mmol/L (ref 21–32)
CREATININE: 5.44 mg/dL — AB (ref 0.60–1.30)
Chloride: 101 mmol/L (ref 98–107)
EGFR (African American): 10 — ABNORMAL LOW
EGFR (Non-African Amer.): 8 — ABNORMAL LOW
GLUCOSE: 92 mg/dL (ref 65–99)
Osmolality: 275 (ref 275–301)
Potassium: 4.3 mmol/L (ref 3.5–5.1)
Sodium: 136 mmol/L (ref 136–145)

## 2014-02-13 LAB — CBC
HCT: 32 % — AB (ref 35.0–47.0)
HGB: 10 g/dL — ABNORMAL LOW (ref 12.0–16.0)
MCH: 28.3 pg (ref 26.0–34.0)
MCHC: 31.1 g/dL — AB (ref 32.0–36.0)
MCV: 91 fL (ref 80–100)
Platelet: 178 10*3/uL (ref 150–440)
RBC: 3.52 10*6/uL — ABNORMAL LOW (ref 3.80–5.20)
RDW: 20.3 % — AB (ref 11.5–14.5)
WBC: 5.6 10*3/uL (ref 3.6–11.0)

## 2014-02-13 LAB — CK-MB
CK-MB: 0.8 ng/mL (ref 0.5–3.6)
CK-MB: 0.6 ng/mL (ref 0.5–3.6)
CK-MB: 0.8 ng/mL (ref 0.5–3.6)

## 2014-02-13 LAB — TROPONIN I
TROPONIN-I: 0.04 ng/mL
Troponin-I: 0.06 ng/mL — ABNORMAL HIGH
Troponin-I: 0.04 ng/mL
Troponin-I: 0.04 ng/mL

## 2014-02-13 LAB — MAGNESIUM: Magnesium: 1.9 mg/dL

## 2014-02-14 LAB — CBC WITH DIFFERENTIAL/PLATELET
Basophil #: 0.1 10*3/uL (ref 0.0–0.1)
Basophil %: 0.9 %
EOS ABS: 0 10*3/uL (ref 0.0–0.7)
Eosinophil %: 0 %
HCT: 29.3 % — ABNORMAL LOW (ref 35.0–47.0)
HGB: 9.5 g/dL — ABNORMAL LOW (ref 12.0–16.0)
Lymphocyte #: 0.6 10*3/uL — ABNORMAL LOW (ref 1.0–3.6)
Lymphocyte %: 7.8 %
MCH: 29.5 pg (ref 26.0–34.0)
MCHC: 32.6 g/dL (ref 32.0–36.0)
MCV: 91 fL (ref 80–100)
MONOS PCT: 4.2 %
Monocyte #: 0.3 x10 3/mm (ref 0.2–0.9)
NEUTROS ABS: 6.7 10*3/uL — AB (ref 1.4–6.5)
Neutrophil %: 87.1 %
PLATELETS: 160 10*3/uL (ref 150–440)
RBC: 3.23 10*6/uL — ABNORMAL LOW (ref 3.80–5.20)
RDW: 20.1 % — ABNORMAL HIGH (ref 11.5–14.5)
WBC: 7.7 10*3/uL (ref 3.6–11.0)

## 2014-02-14 LAB — CK-MB
CK-MB: 0.8 ng/mL (ref 0.5–3.6)
CK-MB: 0.5 ng/mL (ref 0.5–3.6)

## 2014-02-14 LAB — TROPONIN I
TROPONIN-I: 0.04 ng/mL
TROPONIN-I: 0.04 ng/mL

## 2014-02-14 LAB — BASIC METABOLIC PANEL
ANION GAP: 8 (ref 7–16)
BUN: 35 mg/dL — AB (ref 7–18)
CALCIUM: 8.7 mg/dL (ref 8.5–10.1)
CO2: 23 mmol/L (ref 21–32)
Chloride: 100 mmol/L (ref 98–107)
Creatinine: 6.6 mg/dL — ABNORMAL HIGH (ref 0.60–1.30)
EGFR (African American): 8 — ABNORMAL LOW
EGFR (Non-African Amer.): 7 — ABNORMAL LOW
GLUCOSE: 130 mg/dL — AB (ref 65–99)
Osmolality: 272 (ref 275–301)
POTASSIUM: 5.7 mmol/L — AB (ref 3.5–5.1)
Sodium: 131 mmol/L — ABNORMAL LOW (ref 136–145)

## 2014-02-14 LAB — PHOSPHORUS: Phosphorus: 6.8 mg/dL — ABNORMAL HIGH (ref 2.5–4.9)

## 2014-02-15 LAB — PHOSPHORUS: Phosphorus: 3.8 mg/dL (ref 2.5–4.9)

## 2014-02-16 LAB — VANCOMYCIN, TROUGH: Vancomycin, Trough: 16 ug/mL (ref 10–20)

## 2014-02-16 LAB — PHOSPHORUS: Phosphorus: 2.7 mg/dL (ref 2.5–4.9)

## 2014-02-17 ENCOUNTER — Observation Stay: Payer: Self-pay | Admitting: Internal Medicine

## 2014-02-17 ENCOUNTER — Telehealth: Payer: Self-pay | Admitting: Internal Medicine

## 2014-02-17 ENCOUNTER — Telehealth: Payer: Self-pay | Admitting: *Deleted

## 2014-02-17 LAB — COMPREHENSIVE METABOLIC PANEL
ALK PHOS: 67 U/L
ANION GAP: 9 (ref 7–16)
Albumin: 3 g/dL — ABNORMAL LOW (ref 3.4–5.0)
BILIRUBIN TOTAL: 1.2 mg/dL — AB (ref 0.2–1.0)
BUN: 19 mg/dL — ABNORMAL HIGH (ref 7–18)
CREATININE: 3.59 mg/dL — AB (ref 0.60–1.30)
Calcium, Total: 9.3 mg/dL (ref 8.5–10.1)
Chloride: 99 mmol/L (ref 98–107)
Co2: 30 mmol/L (ref 21–32)
EGFR (African American): 16 — ABNORMAL LOW
GFR CALC NON AF AMER: 14 — AB
Glucose: 102 mg/dL — ABNORMAL HIGH (ref 65–99)
OSMOLALITY: 278 (ref 275–301)
Potassium: 3.6 mmol/L (ref 3.5–5.1)
SGOT(AST): 18 U/L (ref 15–37)
SGPT (ALT): 30 U/L (ref 12–78)
Sodium: 138 mmol/L (ref 136–145)
Total Protein: 6.7 g/dL (ref 6.4–8.2)

## 2014-02-17 LAB — CBC
HCT: 33.5 % — ABNORMAL LOW (ref 35.0–47.0)
HGB: 11 g/dL — AB (ref 12.0–16.0)
MCH: 29.9 pg (ref 26.0–34.0)
MCHC: 32.8 g/dL (ref 32.0–36.0)
MCV: 91 fL (ref 80–100)
Platelet: 171 10*3/uL (ref 150–440)
RBC: 3.68 10*6/uL — ABNORMAL LOW (ref 3.80–5.20)
RDW: 19.7 % — AB (ref 11.5–14.5)
WBC: 7.1 10*3/uL (ref 3.6–11.0)

## 2014-02-17 LAB — LIPASE, BLOOD: LIPASE: 89 U/L (ref 73–393)

## 2014-02-17 LAB — CLOSTRIDIUM DIFFICILE(ARMC)

## 2014-02-17 NOTE — Telephone Encounter (Signed)
Call a nurse called stated patient having severe diarrhea and with history advised patient needed to go to ED not able to give fluids here if needed FYI patient to follow advice.

## 2014-02-17 NOTE — Telephone Encounter (Signed)
Patient Information:  Caller Name: Karren BurlyDwight  Phone: [phone]  Patient: Ernst BowlerRatliss, Shalom E  Gender: Female  DOB: November 09, 1961  Age: 53 Years  PCP: Duncan Dullullo, Teresa (Adults only)  Pregnant: No  Office Follow Up:  Does the office need to follow up with this patient?: No  Instructions For The Office: N/A  RN Note:  Instructed husband to go to ED now; pt will comply; message sent to office for a heads up per practice profile  Symptoms  Reason For Call & Symptoms: Husband is calling regarding vomiting and diarrhea; vomited 6 times today; diarrhea 15 times; last time urinated 1 hour ago; having severe pain in abdominal at present time; last diarrhea at present time; last vomiting at 10:00am; pt is a dialysis pt andwas just discharged on 02/15/14; pt is concerned she is dehydrated  Reviewed Health History In EMR: Yes  Reviewed Medications In EMR: Yes  Reviewed Allergies In EMR: Yes  Reviewed Surgeries / Procedures: Yes  Date of Onset of Symptoms: 02/16/2014 OB / GYN:  LMP: Unknown  Guideline(s) Used:  Diarrhea  Disposition Per Guideline:   Go to ED Now (or to Office with PCP Approval)  Reason For Disposition Reached:   Severe abdominal pain (e.g., excruciating)  Advice Given:  Call Back If:  You become worse.  Patient Will Follow Care Advice:  YES

## 2014-02-18 LAB — CBC WITH DIFFERENTIAL/PLATELET
Basophil #: 0.1 10*3/uL (ref 0.0–0.1)
Basophil %: 1 %
EOS ABS: 0.1 10*3/uL (ref 0.0–0.7)
EOS PCT: 1.9 %
HCT: 28.9 % — ABNORMAL LOW (ref 35.0–47.0)
HGB: 9.5 g/dL — ABNORMAL LOW (ref 12.0–16.0)
Lymphocyte #: 1.2 10*3/uL (ref 1.0–3.6)
Lymphocyte %: 22.4 %
MCH: 29.8 pg (ref 26.0–34.0)
MCHC: 32.8 g/dL (ref 32.0–36.0)
MCV: 91 fL (ref 80–100)
Monocyte #: 0.3 x10 3/mm (ref 0.2–0.9)
Monocyte %: 6.1 %
NEUTROS ABS: 3.7 10*3/uL (ref 1.4–6.5)
Neutrophil %: 68.6 %
Platelet: 138 10*3/uL — ABNORMAL LOW (ref 150–440)
RBC: 3.18 10*6/uL — ABNORMAL LOW (ref 3.80–5.20)
RDW: 19.5 % — AB (ref 11.5–14.5)
WBC: 5.4 10*3/uL (ref 3.6–11.0)

## 2014-02-18 LAB — BASIC METABOLIC PANEL
ANION GAP: 9 (ref 7–16)
BUN: 21 mg/dL — ABNORMAL HIGH (ref 7–18)
CHLORIDE: 100 mmol/L (ref 98–107)
Calcium, Total: 8.6 mg/dL (ref 8.5–10.1)
Co2: 26 mmol/L (ref 21–32)
Creatinine: 4.19 mg/dL — ABNORMAL HIGH (ref 0.60–1.30)
EGFR (Non-African Amer.): 12 — ABNORMAL LOW
GFR CALC AF AMER: 13 — AB
GLUCOSE: 78 mg/dL (ref 65–99)
Osmolality: 272 (ref 275–301)
Potassium: 3.8 mmol/L (ref 3.5–5.1)
SODIUM: 135 mmol/L — AB (ref 136–145)

## 2014-02-18 LAB — MAGNESIUM: Magnesium: 1.6 mg/dL — ABNORMAL LOW

## 2014-02-18 LAB — CULTURE, BLOOD (SINGLE)

## 2014-02-21 ENCOUNTER — Encounter: Payer: Self-pay | Admitting: *Deleted

## 2014-03-07 ENCOUNTER — Telehealth: Payer: Self-pay | Admitting: Internal Medicine

## 2014-03-07 ENCOUNTER — Ambulatory Visit: Payer: BC Managed Care – PPO | Admitting: Internal Medicine

## 2014-03-07 MED ORDER — FLUCONAZOLE 150 MG PO TABS: 150.0000 mg | ORAL_TABLET | Freq: Every day | ORAL | Status: DC

## 2014-03-07 NOTE — Telephone Encounter (Signed)
Go ahead with earliest 30 min spot.

## 2014-03-07 NOTE — Telephone Encounter (Signed)
Pt called to cancel her hospital f/u appt 3/24.  States she got sick at dialysis this morning and they had to send her home.  Pt not feeling well enough to come in.  Pt did want to rs appt, no 30 min slot available until mid April.  Pt did not schedule at this time, wants to see if Dr. Darrick Huntsmanullo could see her sooner.  Pt apologizes to Dr. Darrick Huntsmanullo for not being able to make it in today.  Please advise.

## 2014-03-07 NOTE — Telephone Encounter (Signed)
Yes to both questions,  rx sent

## 2014-03-07 NOTE — Telephone Encounter (Signed)
See previous phone note.  Pt also states she still has the UTI symptoms and is asking if she can "get #1 more pill for that.

## 2014-03-07 NOTE — Telephone Encounter (Signed)
Patient is not having Uti , she is having a yeast infection from all the ABX in hospital. Patient states the OTC products do not work, and would like to know if you will call in something. Is mid- April ok for hospital follow -up reschedule?

## 2014-03-10 NOTE — Telephone Encounter (Signed)
appt made 4/2 @ 2;30 p.m., pt states she cannot come; rs to 4/6.  Pt aware.

## 2014-03-16 ENCOUNTER — Ambulatory Visit: Payer: BC Managed Care – PPO | Admitting: Internal Medicine

## 2014-03-20 ENCOUNTER — Encounter: Payer: Self-pay | Admitting: Internal Medicine

## 2014-03-20 ENCOUNTER — Ambulatory Visit (INDEPENDENT_AMBULATORY_CARE_PROVIDER_SITE_OTHER): Payer: BC Managed Care – PPO | Admitting: Internal Medicine

## 2014-03-20 VITALS — BP 126/84 | HR 75 | Temp 98.2°F | Resp 16 | Wt 125.8 lb

## 2014-03-20 DIAGNOSIS — B3731 Acute candidiasis of vulva and vagina: Secondary | ICD-10-CM

## 2014-03-20 DIAGNOSIS — Z716 Tobacco abuse counseling: Secondary | ICD-10-CM

## 2014-03-20 DIAGNOSIS — E8770 Fluid overload, unspecified: Secondary | ICD-10-CM

## 2014-03-20 DIAGNOSIS — I480 Paroxysmal atrial fibrillation: Secondary | ICD-10-CM

## 2014-03-20 DIAGNOSIS — F17200 Nicotine dependence, unspecified, uncomplicated: Secondary | ICD-10-CM

## 2014-03-20 DIAGNOSIS — I10 Essential (primary) hypertension: Secondary | ICD-10-CM

## 2014-03-20 DIAGNOSIS — I4891 Unspecified atrial fibrillation: Secondary | ICD-10-CM

## 2014-03-20 DIAGNOSIS — E8779 Other fluid overload: Secondary | ICD-10-CM

## 2014-03-20 DIAGNOSIS — Z7189 Other specified counseling: Secondary | ICD-10-CM

## 2014-03-20 DIAGNOSIS — B373 Candidiasis of vulva and vagina: Secondary | ICD-10-CM

## 2014-03-20 MED ORDER — FLUCONAZOLE 150 MG PO TABS: 150.0000 mg | ORAL_TABLET | Freq: Every day | ORAL | Status: DC

## 2014-03-20 NOTE — Progress Notes (Signed)
Patient ID: Dorothy Long, female   DOB: [birth year], 53 y.o.   MRN: [id number]  Patient Active Problem List   Diagnosis Date Noted  . Candida vaginitis 03/21/2014  . Screening for STD (sexually transmitted disease) 02/01/2014  . Screening for cervical cancer 02/01/2014  . Screening for breast cancer 02/01/2014  . Encounter for preventive health examination 02/01/2014  . Awaiting organ transplant 12/23/2013  . Hyperparathyroidism due to renal insufficiency 12/23/2013  . Excess fluid volume 09/27/2013  . Paroxysmal atrial fibrillation 09/25/2013  . GERD (gastroesophageal reflux disease) 08/12/2013  . Focal and segmental hyalinosis 08/08/2013  . Mitral stenosis with incompetence or regurgitation 07/19/2013  . Anemia in chronic kidney disease 07/19/2013  . End stage kidney disease 07/14/2013  . Tobacco abuse counseling 12/20/2012  . Vitamin D deficiency 07/22/2012  . Hyperlipidemia   . Hypertension 07/21/2012  . Family history of colon cancer 07/21/2012  . Tobacco abuse 07/21/2012    Subjective:  CC:   Chief Complaint  Patient presents with  . Follow-up    hospital pneumonia    HPI:   Dorothy Long is a 53 y.o. female who presents for Follow up on chronic conditions including hypertension,  ESKD and protein malnutrition.    She has been hospitalizedfor the third time since [facility]. The first was in early February and discharged home on both augmentin and azithromycin, then admitted  On March 2 to march 5th for COPD exacerbation with pneumonia, discharged home on prednisone nad abx,  Then readmitted one day later with N/V and diarrhea one day later causing  dehydration which required 4 days of IV fluids,  C dificile test was negative.    She has gained 3-4 lbs but feels it is all water weight,  Notes that her abdomen becomes significantly distended after meals.  Dr Thedore Mins has increased her furosemide to  80 mg daily and she is considering peritoneal dialysis as an  alternative to the hemodialysis she has been receiving due to frequent hospitalizations for fluid overload and pneumonia.  She denies shortness of breath, cough, fevers, and abdominal pain.  Does note leg swelling, recurrent,  Despite increased dose of furosemide  Hospital follow up and for PE.  She has had two hospitlalizations in the past month, first one Jan 24/25 for chf with pleural effusions.  2nd one was from Feb 5 to 10 for pneumonia with pleurisy.  Discharged home on abx ,  Having some vaginal itching wonders if she has candida. Had dialysis yesterday per regular schedule  In [place],  T H and Saturday. Porta cath was removed two weeks ago.  Still wearing the dressing from 2 weeks ago.  Has been noting abdominal distension and weight gain of 1 lb on non dialysis days .  Latiffe started her on furosemide 80 mg twice daily after dialysis yesterday bc od recurrent weight gain and she is voiding 3/day currently.   Had one day of  Diarrhea after discharge but this was several days ago and stools are back to normal.  Still having Right CVA pain,  worriied she has a UTI.   Needs to complete all screenings prior to being considered for kidney transplant.  S/p abd hysterectomy remotely,  Not sure if she still has a cervix.  Not sexually active recently.  Has GI intolerance to diflucan.    Past Medical History  Diagnosis Date  . Hypertension   . Hyperlipidemia   . COPD (chronic obstructive pulmonary disease)   . Anemia   .  Mitral valve stenosis, severe   . CHF (congestive heart failure)   . Chronic kidney disease   . Acute on chronic renal failure   . Diastolic congestive heart failure     Past Surgical History  Procedure Laterality Date  . Abdominal hysterectomy    . Cholecystectomy    . Knee surgery Right   . Foot surgery Bilateral   . Av fistula placement         The following portions of the patient's history were reviewed and updated as appropriate: Allergies, current  medications, and problem list.    Review of Systems:   Patient denies headache, fevers, malaise, unintentional weight loss, skin rash, eye pain, sinus congestion and sinus pain, sore throat, dysphagia,  hemoptysis , cough, dyspnea, wheezing, chest pain, palpitations, orthopnea, edema, abdominal pain, nausea, melena, diarrhea, constipation, flank pain, dysuria, hematuria, urinary  Frequency, nocturia, numbness, tingling, seizures,  Focal weakness, Loss of consciousness,  Tremor, insomnia, depression, anxiety, and suicidal ideation.     History   Social History  . Marital Status: Married    Spouse Name: N/A    Number of Children: N/A  . Years of Education: N/A   Occupational History  . Not on file.   Social History Main Topics  . Smoking status: Current Some Day Smoker -- 0.25 packs/day for 20 years    Types: Cigarettes  . Smokeless tobacco: Never Used  . Alcohol Use: No     Comment: occasional  . Drug Use: No  . Sexual Activity: Not on file   Other Topics Concern  . Not on file   Social History Narrative  . No narrative on file    Objective:  Filed Vitals:   03/20/14 1057  BP: 126/84  Pulse: 75  Temp: 98.2 F (36.8 C)  Resp: 16     General appearance: alert, cooperative and appears stated age Ears: normal TM's and external ear canals both ears Throat: lips, mucosa, and tongue normal; teeth and gums normal Neck: no adenopathy, no carotid bruit, supple, symmetrical, trachea midline and thyroid not enlarged, symmetric, no tenderness/mass/nodules Back: symmetric, no curvature. ROM normal. No CVA tenderness. Lungs: clear to auscultation bilaterally Heart: regular rate and rhythm, S1, S2 normal, no murmur, click, rub or gallop Abdomen: soft, non-tender; bowel sounds normal; no masses,  no organomegaly Pulses: 2+ and symmetric Skin: Skin color, texture, turgor normal. No rashes or lesions Lymph nodes: Cervical, supraclavicular, and axillary nodes  normal.  Assessment and Plan:  Hypertension Well controlled on 5 mg amlodipine, but this may be aggravating her LE edema .  Resting pulse is 75 so she may tolerate metoprolol if persistent edema is noted.  Will initiate trial of metoprolol and reduce amlodipine to 2.5 mg   Paroxysmal atrial fibrillation No longer taking diltiazem. Given chronic recurrent edema. Will use metoprolol in the future if tolerated by pulse and BP  Tobacco abuse counseling The patient was counseled on the dangers of tobacco use, and was advised to quit and wanting to try the patches.  Reviewed strategies to maximize success, including removing cigarettes and smoking materials from environment, stress management and support of family/friends.  She was prescribed nicoderm patches at discharge from hospital.   Candida vaginitis Presumed, based on patient complaints and recetn use of abx and prednisone. Fluconazole x 2 days,, if symptoms,  Persist, she will need a  pelvic exam   Excess fluid volume Advised patient th discuss addition of xaroxalyn to lasix for management of edema.  Reducing amlodipine to 2.5 mg   A total of 40 minutes was spent with patient more than half of which was spent in counseling, reviewing records from other providers and coordination of care.  Updated Medication List Outpatient Encounter Prescriptions as of 03/20/2014  Medication Sig  . acetaminophen (TYLENOL) 325 MG tablet Take by mouth.  Marland Kitchen. albuterol (PROAIR HFA) 108 (90 BASE) MCG/ACT inhaler Inhale into the lungs.  . ALPRAZolam (XANAX) 0.25 MG tablet Take 0.25 mg by mouth 3 (three) times daily as needed for sleep.  Marland Kitchen. amLODipine (NORVASC) 2.5 MG tablet Take 1 tablet (2.5 mg total) by mouth daily.  . Calcium Acetate 667 MG TABS Take 3 capsules by mouth 3 (three) times daily.  Marland Kitchen. dexlansoprazole (DEXILANT) 60 MG capsule Take 60 mg by mouth daily as needed.  . diphenhydrAMINE (BENADRYL) 25 MG tablet Take 25 mg by mouth every other day. MON,  WED, FRI BEFORE DIALYSIS  . furosemide (LASIX) 40 MG tablet Take 1 tablet (40 mg total) by mouth 2 (two) times daily as needed.  Marland Kitchen. HYDROcodone-acetaminophen (NORCO/VICODIN) 5-325 MG per tablet Take 1 tablet by mouth every 6 (six) hours as needed for moderate pain.  . Lactulose 20 GM/30ML SOLN Take 30 mLs (20 g total) by mouth every 6 (six) hours as needed. To relieve constipation  . tiotropium (SPIRIVA HANDIHALER) 18 MCG inhalation capsule Place 18 mcg into inhaler and inhale daily.  . [DISCONTINUED] amLODipine (NORVASC) 5 MG tablet   . [DISCONTINUED] ferrous sulfate 325 (65 FE) MG tablet Take 325 mg by mouth at bedtime.  . fluconazole (DIFLUCAN) 150 MG tablet Take 1 tablet (150 mg total) by mouth daily.  . metoprolol succinate (TOPROL-XL) 25 MG 24 hr tablet Take 1 tablet (25 mg total) by mouth daily.  . nicotine (NICODERM CQ - DOSED IN MG/24 HR) 7 mg/24hr patch   . [DISCONTINUED] aspirin 325 MG tablet Take by mouth.  . [DISCONTINUED] calcium acetate (PHOSLO) 667 MG capsule   . [DISCONTINUED] diltiazem (CARDIZEM CD) 240 MG 24 hr capsule Take by mouth.  . [DISCONTINUED] fluconazole (DIFLUCAN) 150 MG tablet Take 1 tablet (150 mg total) by mouth daily.  . [DISCONTINUED] lidocaine-prilocaine (EMLA) cream   . [DISCONTINUED] loperamide (IMODIUM) 2 MG capsule   . [DISCONTINUED] ondansetron (ZOFRAN-ODT) 4 MG disintegrating tablet      No orders of the defined types were placed in this encounter.    No Follow-up on file.

## 2014-03-20 NOTE — Patient Instructions (Signed)
I sent fluconazole to [facility] for your vaginitis

## 2014-03-20 NOTE — Progress Notes (Signed)
Pre-visit discussion using our clinic review tool. No additional management support is needed unless otherwise documented below in the visit note.  

## 2014-03-21 ENCOUNTER — Telehealth: Payer: Self-pay | Admitting: Internal Medicine

## 2014-03-21 ENCOUNTER — Encounter: Payer: Self-pay | Admitting: Internal Medicine

## 2014-03-21 DIAGNOSIS — B3731 Acute candidiasis of vulva and vagina: Secondary | ICD-10-CM | POA: Insufficient documentation

## 2014-03-21 DIAGNOSIS — B373 Candidiasis of vulva and vagina: Secondary | ICD-10-CM | POA: Insufficient documentation

## 2014-03-21 MED ORDER — AMLODIPINE BESYLATE 2.5 MG PO TABS: 2.5000 mg | ORAL_TABLET | Freq: Every day | ORAL | Status: DC

## 2014-03-21 MED ORDER — METOPROLOL SUCCINATE ER 25 MG PO TB24: 25.0000 mg | ORAL_TABLET | Freq: Every day | ORAL | Status: DC

## 2014-03-21 NOTE — Telephone Encounter (Signed)
After reviewing her chart I think we need to reduce her dose of amlodipine bc it may be aggravating her fluid retention,  I would like to reduce it to 2.5 mg daily and add metoprolol ER 25 mg daily.  This will provide BP control. The diuretic I wanted her to discuss with Dr Thedore MinsSingh may not need to be added if reducing the amlodipine helps (Zaroxalyn is the diuretic )

## 2014-03-21 NOTE — Assessment & Plan Note (Signed)
Advised patient th discuss addition of xaroxalyn to lasix for management of edema.  Reducing amlodipine to 2.5 mg

## 2014-03-21 NOTE — Assessment & Plan Note (Signed)
No longer taking diltiazem. Given chronic recurrent edema. Will use metoprolol in the future if tolerated by pulse and BP

## 2014-03-21 NOTE — Assessment & Plan Note (Addendum)
Well controlled on 5 mg amlodipine, but this may be aggravating her LE edema .  Resting pulse is 75 so she may tolerate metoprolol if persistent edema is noted.  Will initiate trial of metoprolol and reduce amlodipine to 2.5 mg

## 2014-03-21 NOTE — Assessment & Plan Note (Signed)
Presumed, based on patient complaints and recetn use of abx and prednisone. Fluconazole x 2 days,, if symptoms,  Persist, she will need a  pelvic exam

## 2014-03-21 NOTE — Telephone Encounter (Signed)
Relevant patient education assigned to patient using Emmi. ° °

## 2014-03-21 NOTE — Assessment & Plan Note (Signed)
The patient was counseled on the dangers of tobacco use, and was advised to quit and wanting to try the patches.  Reviewed strategies to maximize success, including removing cigarettes and smoking materials from environment, stress management and support of family/friends.  She was prescribed nicoderm patches at discharge from hospital.

## 2014-03-22 ENCOUNTER — Telehealth: Payer: Self-pay | Admitting: *Deleted

## 2014-03-22 ENCOUNTER — Telehealth: Payer: Self-pay | Admitting: Internal Medicine

## 2014-03-22 ENCOUNTER — Ambulatory Visit: Payer: Self-pay | Admitting: Vascular Surgery

## 2014-03-22 LAB — BASIC METABOLIC PANEL
Anion Gap: 4 — ABNORMAL LOW (ref 7–16)
BUN: 12 mg/dL (ref 7–18)
CALCIUM: 8.9 mg/dL (ref 8.5–10.1)
CHLORIDE: 100 mmol/L (ref 98–107)
Co2: 32 mmol/L (ref 21–32)
Creatinine: 3.81 mg/dL — ABNORMAL HIGH (ref 0.60–1.30)
EGFR (Non-African Amer.): 13 — ABNORMAL LOW
GFR CALC AF AMER: 15 — AB
GLUCOSE: 95 mg/dL (ref 65–99)
Osmolality: 272 (ref 275–301)
Potassium: 3.1 mmol/L — ABNORMAL LOW (ref 3.5–5.1)
SODIUM: 136 mmol/L (ref 136–145)

## 2014-03-22 LAB — CBC
HCT: 34 % — AB (ref 35.0–47.0)
HGB: 10.8 g/dL — ABNORMAL LOW (ref 12.0–16.0)
MCH: 28.9 pg (ref 26.0–34.0)
MCHC: 31.7 g/dL — AB (ref 32.0–36.0)
MCV: 91 fL (ref 80–100)
PLATELETS: 142 10*3/uL — AB (ref 150–440)
RBC: 3.72 10*6/uL — ABNORMAL LOW (ref 3.80–5.20)
RDW: 19.7 % — AB (ref 11.5–14.5)
WBC: 4.7 10*3/uL (ref 3.6–11.0)

## 2014-03-22 MED ORDER — POTASSIUM CHLORIDE CRYS ER 20 MEQ PO TBCR: 20.0000 meq | EXTENDED_RELEASE_TABLET | Freq: Two times a day (BID) | ORAL | Status: DC

## 2014-03-22 NOTE — Telephone Encounter (Signed)
Patient notified

## 2014-03-22 NOTE — Telephone Encounter (Signed)
Potassium supplement for 4 doses sent to pharmacy

## 2014-03-22 NOTE — Telephone Encounter (Signed)
Left message for patient to call office on 03/21/14

## 2014-03-22 NOTE — Telephone Encounter (Signed)
Relevant patient education mailed to patient.  

## 2014-03-22 NOTE — Telephone Encounter (Signed)
Sherry from pre- admit testing called for patient K+ of 3.1 and is faxing over results please advise.

## 2014-03-28 ENCOUNTER — Other Ambulatory Visit: Payer: Self-pay | Admitting: Nephrology

## 2014-03-28 LAB — POTASSIUM: POTASSIUM: 3.5 mmol/L (ref 3.5–5.1)

## 2014-03-29 ENCOUNTER — Ambulatory Visit: Payer: Self-pay | Admitting: Vascular Surgery

## 2014-03-29 DIAGNOSIS — Z992 Dependence on renal dialysis: Secondary | ICD-10-CM

## 2014-03-29 HISTORY — PX: PERITONEAL CATHETER INSERTION: SHX2223

## 2014-03-29 HISTORY — DX: Dependence on renal dialysis: Z99.2

## 2014-04-03 ENCOUNTER — Emergency Department: Payer: Self-pay | Admitting: Emergency Medicine

## 2014-04-03 LAB — CBC
HCT: 35.7 % (ref 35.0–47.0)
HGB: 11.4 g/dL — ABNORMAL LOW (ref 12.0–16.0)
MCH: 29.8 pg (ref 26.0–34.0)
MCHC: 31.9 g/dL — ABNORMAL LOW (ref 32.0–36.0)
MCV: 93 fL (ref 80–100)
Platelet: 92 10*3/uL — ABNORMAL LOW (ref 150–440)
RBC: 3.83 10*6/uL (ref 3.80–5.20)
RDW: 20.7 % — AB (ref 11.5–14.5)
WBC: 4 10*3/uL (ref 3.6–11.0)

## 2014-04-03 LAB — COMPREHENSIVE METABOLIC PANEL
ALBUMIN: 3 g/dL — AB (ref 3.4–5.0)
Alkaline Phosphatase: 109 U/L
Anion Gap: 8 (ref 7–16)
BUN: 16 mg/dL (ref 7–18)
Bilirubin,Total: 1.1 mg/dL — ABNORMAL HIGH (ref 0.2–1.0)
CHLORIDE: 98 mmol/L (ref 98–107)
CO2: 30 mmol/L (ref 21–32)
Calcium, Total: 8.7 mg/dL (ref 8.5–10.1)
Creatinine: 4.31 mg/dL — ABNORMAL HIGH (ref 0.60–1.30)
EGFR (Non-African Amer.): 11 — ABNORMAL LOW
GFR CALC AF AMER: 13 — AB
Glucose: 105 mg/dL — ABNORMAL HIGH (ref 65–99)
Osmolality: 274 (ref 275–301)
Potassium: 3.6 mmol/L (ref 3.5–5.1)
SGOT(AST): 25 U/L (ref 15–37)
SGPT (ALT): 15 U/L (ref 12–78)
Sodium: 136 mmol/L (ref 136–145)
Total Protein: 6.9 g/dL (ref 6.4–8.2)

## 2014-05-03 ENCOUNTER — Inpatient Hospital Stay: Payer: Self-pay | Admitting: Internal Medicine

## 2014-05-03 LAB — LIPID PANEL
Cholesterol: 120 mg/dL (ref 0–200)
HDL Cholesterol: 44 mg/dL (ref 40–60)
LDL CHOLESTEROL, CALC: 52 mg/dL (ref 0–100)
Triglycerides: 120 mg/dL (ref 0–200)
VLDL Cholesterol, Calc: 24 mg/dL (ref 5–40)

## 2014-05-03 LAB — CBC WITH DIFFERENTIAL/PLATELET
Basophil #: 0.1 10*3/uL (ref 0.0–0.1)
Basophil %: 1.2 %
EOS ABS: 0.1 10*3/uL (ref 0.0–0.7)
Eosinophil %: 2.5 %
HCT: 35 % (ref 35.0–47.0)
HGB: 11.3 g/dL — AB (ref 12.0–16.0)
Lymphocyte #: 1.2 10*3/uL (ref 1.0–3.6)
Lymphocyte %: 22.2 %
MCH: 29.4 pg (ref 26.0–34.0)
MCHC: 32.3 g/dL (ref 32.0–36.0)
MCV: 91 fL (ref 80–100)
MONOS PCT: 9.8 %
Monocyte #: 0.5 x10 3/mm (ref 0.2–0.9)
NEUTROS ABS: 3.4 10*3/uL (ref 1.4–6.5)
Neutrophil %: 64.3 %
Platelet: 174 10*3/uL (ref 150–440)
RBC: 3.85 10*6/uL (ref 3.80–5.20)
RDW: 19 % — ABNORMAL HIGH (ref 11.5–14.5)
WBC: 5.2 10*3/uL (ref 3.6–11.0)

## 2014-05-03 LAB — COMPREHENSIVE METABOLIC PANEL
ANION GAP: 15 (ref 7–16)
Albumin: 2.7 g/dL — ABNORMAL LOW (ref 3.4–5.0)
Alkaline Phosphatase: 83 U/L
BILIRUBIN TOTAL: 0.8 mg/dL (ref 0.2–1.0)
BUN: 47 mg/dL — ABNORMAL HIGH (ref 7–18)
CALCIUM: 8.3 mg/dL — AB (ref 8.5–10.1)
CREATININE: 7.33 mg/dL — AB (ref 0.60–1.30)
Chloride: 102 mmol/L (ref 98–107)
Co2: 20 mmol/L — ABNORMAL LOW (ref 21–32)
EGFR (Non-African Amer.): 6 — ABNORMAL LOW
GFR CALC AF AMER: 7 — AB
GLUCOSE: 124 mg/dL — AB (ref 65–99)
Osmolality: 287 (ref 275–301)
Potassium: 4 mmol/L (ref 3.5–5.1)
SGOT(AST): 23 U/L (ref 15–37)
SGPT (ALT): 20 U/L (ref 12–78)
Sodium: 137 mmol/L (ref 136–145)
Total Protein: 6.6 g/dL (ref 6.4–8.2)

## 2014-05-03 LAB — BASIC METABOLIC PANEL
Anion Gap: 15 (ref 7–16)
BUN: 51 mg/dL — ABNORMAL HIGH (ref 7–18)
CREATININE: 8.04 mg/dL — AB (ref 0.60–1.30)
Calcium, Total: 8 mg/dL — ABNORMAL LOW (ref 8.5–10.1)
Chloride: 104 mmol/L (ref 98–107)
Co2: 21 mmol/L (ref 21–32)
EGFR (African American): 6 — ABNORMAL LOW
EGFR (Non-African Amer.): 5 — ABNORMAL LOW
Glucose: 123 mg/dL — ABNORMAL HIGH (ref 65–99)
Osmolality: 294 (ref 275–301)
Potassium: 4 mmol/L (ref 3.5–5.1)
Sodium: 140 mmol/L (ref 136–145)

## 2014-05-03 LAB — TROPONIN I
TROPONIN-I: 0.03 ng/mL
TROPONIN-I: 0.03 ng/mL
TROPONIN-I: 0.04 ng/mL

## 2014-05-03 LAB — CBC
HCT: 36 % (ref 35.0–47.0)
HGB: 11.8 g/dL — ABNORMAL LOW (ref 12.0–16.0)
MCH: 30 pg (ref 26.0–34.0)
MCHC: 32.9 g/dL (ref 32.0–36.0)
MCV: 91 fL (ref 80–100)
PLATELETS: 170 10*3/uL (ref 150–440)
RBC: 3.94 10*6/uL (ref 3.80–5.20)
RDW: 18.9 % — ABNORMAL HIGH (ref 11.5–14.5)
WBC: 5.6 10*3/uL (ref 3.6–11.0)

## 2014-05-03 LAB — PRO B NATRIURETIC PEPTIDE: B-Type Natriuretic Peptide: 47768 pg/mL — ABNORMAL HIGH (ref 0–125)

## 2014-05-03 LAB — CK TOTAL AND CKMB (NOT AT ARMC)
CK, TOTAL: 101 U/L
CK-MB: 1.1 ng/mL (ref 0.5–3.6)

## 2014-05-03 LAB — PHOSPHORUS: Phosphorus: 8 mg/dL — ABNORMAL HIGH (ref 2.5–4.9)

## 2014-05-04 DIAGNOSIS — R079 Chest pain, unspecified: Secondary | ICD-10-CM

## 2014-05-04 DIAGNOSIS — I319 Disease of pericardium, unspecified: Secondary | ICD-10-CM

## 2014-05-04 DIAGNOSIS — I10 Essential (primary) hypertension: Secondary | ICD-10-CM

## 2014-05-04 DIAGNOSIS — N186 End stage renal disease: Secondary | ICD-10-CM

## 2014-05-04 DIAGNOSIS — I059 Rheumatic mitral valve disease, unspecified: Secondary | ICD-10-CM

## 2014-05-04 LAB — URINALYSIS, COMPLETE
Bacteria: NONE SEEN
Bilirubin,UR: NEGATIVE
Blood: NEGATIVE
Glucose,UR: 50 mg/dL (ref 0–75)
Ketone: NEGATIVE
Leukocyte Esterase: NEGATIVE
Nitrite: NEGATIVE
Ph: 9 (ref 4.5–8.0)
Specific Gravity: 1.01 (ref 1.003–1.030)

## 2014-05-09 ENCOUNTER — Encounter: Payer: Self-pay | Admitting: *Deleted

## 2014-05-09 ENCOUNTER — Telehealth: Payer: Self-pay

## 2014-05-09 NOTE — Telephone Encounter (Signed)
Patient contacted regarding discharge from [facility] on 05/04/14.  Patient understands to follow up with Ward Givens, NP on 05/10/14 at 2:45 at [facility]. Patient understands discharge instructions? yes Patient understands medications and regiment? yes Patient understands to bring all medications to this visit? yes  Pt would like to reschedule this appt to another date, as she has dialysis tomorrow am and prefers to see Dr. Mariah Milling. Pt resched for 05/15/14 @ 10:45 to see Dr. Mariah Milling.

## 2014-05-10 ENCOUNTER — Encounter: Payer: BC Managed Care – PPO | Admitting: Nurse Practitioner

## 2014-05-15 ENCOUNTER — Ambulatory Visit (INDEPENDENT_AMBULATORY_CARE_PROVIDER_SITE_OTHER): Payer: BC Managed Care – PPO | Admitting: Cardiovascular Disease

## 2014-05-15 ENCOUNTER — Encounter: Payer: Self-pay | Admitting: Cardiovascular Disease

## 2014-05-15 VITALS — BP 120/80 | HR 101 | Ht 70.0 in | Wt 121.5 lb

## 2014-05-15 DIAGNOSIS — N186 End stage renal disease: Secondary | ICD-10-CM

## 2014-05-15 DIAGNOSIS — R600 Localized edema: Secondary | ICD-10-CM | POA: Insufficient documentation

## 2014-05-15 DIAGNOSIS — Z720 Tobacco use: Secondary | ICD-10-CM

## 2014-05-15 DIAGNOSIS — I052 Rheumatic mitral stenosis with insufficiency: Secondary | ICD-10-CM

## 2014-05-15 DIAGNOSIS — I10 Essential (primary) hypertension: Secondary | ICD-10-CM

## 2014-05-15 DIAGNOSIS — I480 Paroxysmal atrial fibrillation: Secondary | ICD-10-CM

## 2014-05-15 DIAGNOSIS — F17200 Nicotine dependence, unspecified, uncomplicated: Secondary | ICD-10-CM

## 2014-05-15 DIAGNOSIS — R609 Edema, unspecified: Secondary | ICD-10-CM

## 2014-05-15 DIAGNOSIS — I4891 Unspecified atrial fibrillation: Secondary | ICD-10-CM

## 2014-05-15 MED ORDER — METOLAZONE 5 MG PO TABS: 5.0000 mg | ORAL_TABLET | Freq: Every day | ORAL | Status: DC | PRN

## 2014-05-15 NOTE — Assessment & Plan Note (Signed)
Unable to tolerate peritoneal dialysis secondary to leg edema. Now doing hemodialysis.

## 2014-05-15 NOTE — Assessment & Plan Note (Signed)
We have encouraged her to continue to work on weaning her cigarettes and smoking cessation. She will continue to work on this and does not want any assistance with chantix.  

## 2014-05-15 NOTE — Progress Notes (Signed)
Patient ID: Dorothy Long, female    DOB: Aug 04, 1961, 53 y.o.   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  She reports that she was on hemodialysis, changed to peritoneal dialysis, recently changed back to hemodialysis 2-3 weeks ago for worsening leg edema. She reports that she drinks significant oral fluids.  She was seen in the hospital may 21st 2015 for atypical chest pain. Negative cardiac enzymes, repeat echocardiogram showing stable mitral valve stenosis and regurgitation, rheumatic valve. She was found to have moderate regurgitation, no dramatic change in her stenosis. She had mildly elevated right ventricular systolic pressures. Mitral gradient was not recorded but it was felt that she had mild to moderate stenosis  She was restarted on amlodipine for blood pressure control. She is uncertain if this has made her leg edema worse   AV fistula upper extremity (11/07/13)  Hospital notes indicate severe anemia on October sixth 2014 Admission to the hospital 09/21/2013 for atrial fibrillation in the setting of diarrhea  [facility]  Low normal left ventricular systolic function, diastolic dysfunction, Biatrial enlargement Mild aortic regurgitation The mitral leaflets are thickened and appear rheumaticically deformed. There is heavy mitral annular calcification. The there is mild mitral stenosis with a calculated valve area of 1.6 cm2 by pressure half-time equation and moderate, eccentric mitral regurgitation Mild to moderate pulmonary hypertension   Stress test 09/28/13 IMPRESSION:  1. No convincing  evidence of pharmacologically induced myocardial ischemia.  2. Apparent anterior apical defect that is present only on attenuation corrected images is favored to reflect artifact due to overcorrection and may be worse on stress imaging secondary to greater patient motion. A small area of ischemia is felt less likely but is difficult to entirely exclude.  3. LVEF = 55%.  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 Wynelle LinkKolluru one year ago. Creatinine at that time was 1.8 In the hospital recently creatinine 5-6  Ultrasound in the hospital showed atrophic right kidney, very thin renal cortex, biopsy could not be done successfully She was started on steroids by Dr. Thedore MinsSingh plan to discharge home to finish her steroid course. She was given Neupogen while in the hospital It was recommended that she stop smoking.  EKG shows normal sinus rhythm with rate 101 beats per minute poor R-wave progression through the anterior precordial leads   Outpatient Encounter Prescriptions as of 05/15/2014  Medication Sig  . acetaminophen (TYLENOL) 325 MG tablet Take 325 mg by mouth every 4 (four) hours as needed.   Marland Kitchen. albuterol (VENTOLIN HFA) 108 (90 BASE) MCG/ACT inhaler Inhale into the lungs every 6 (six) hours as needed for wheezing or shortness of breath.  . ALPRAZolam (XANAX) 0.25 MG tablet Take 0.25 mg by mouth 3 (three) times daily as needed for sleep.  Marland Kitchen. amLODipine (NORVASC) 2.5 MG tablet Take 1 tablet (2.5 mg total) by mouth daily.  . Calcium Acetate 667 MG TABS Take 3 capsules by mouth 3 (three) times daily.  . cinacalcet (SENSIPAR) 30 MG tablet Take 30 mg by mouth daily.  Marland Kitchen. dexlansoprazole (DEXILANT) 60 MG capsule Take 60 mg by mouth daily as  needed.  . diphenhydrAMINE (BENADRYL) 25 MG tablet Take 25 mg by mouth every other day. MON, WED, FRI BEFORE DIALYSIS  . furosemide (LASIX) 40 MG tablet Take 1 tablet (40 mg total) by mouth 2 (two) times daily  as needed.  Marland Kitchen HYDROcodone-acetaminophen (NORCO/VICODIN) 5-325 MG per tablet Take 1 tablet by mouth every 6 (six) hours as needed for moderate pain.  . Lactulose 20 GM/30ML SOLN Take 30 mLs (20 g total) by mouth every 6 (six) hours as needed. To relieve constipation  . metoprolol succinate (TOPROL-XL) 25 MG 24 hr tablet Take 1 tablet (25 mg total) by mouth daily.  Marland Kitchen tiotropium (SPIRIVA HANDIHALER) 18 MCG inhalation capsule Place 18 mcg into inhaler and inhale daily.  . metolazone (ZAROXOLYN) 5 MG tablet Take 1 tablet (5 mg total) by mouth daily as needed.  . [DISCONTINUED] albuterol (PROAIR HFA) 108 (90 BASE) MCG/ACT inhaler Inhale into the lungs.  . [DISCONTINUED] fluconazole (DIFLUCAN) 150 MG tablet Take 1 tablet (150 mg total) by mouth daily.  . [DISCONTINUED] nicotine (NICODERM CQ - DOSED IN MG/24 HR) 7 mg/24hr patch    Review of Systems  Constitutional: Negative.   HENT: Negative.   Eyes: Negative.   Respiratory: Negative.   Cardiovascular: Positive for leg swelling.  Gastrointestinal: Negative.   Endocrine: Negative.   Musculoskeletal: Negative.   Skin: Negative.   Allergic/Immunologic: Negative.   Neurological: Negative.   Hematological: Negative.   Psychiatric/Behavioral: Negative.   All other systems reviewed and are negative.   BP 120/80  Pulse 101  Ht 5\' 10"  (1.778 m)  Wt 121 lb 8 oz (55.112 kg)  BMI 17.43 kg/m2  Physical Exam  Nursing note and vitals reviewed. Constitutional: She is oriented to person, place, and time. She appears well-developed and well-nourished.  HENT:  Head: Normocephalic.  Nose: Nose normal.  Mouth/Throat: Oropharynx is clear and moist.  Eyes: Conjunctivae are normal. Pupils are equal, round, and reactive to light.  Neck: Normal range of motion. Neck supple. No JVD present.  Cardiovascular: Normal rate, regular rhythm, S1 normal, S2 normal and intact distal pulses.  Exam reveals no gallop and no friction rub.   Murmur heard.  Systolic  murmur is present with a grade of 3/6   Diastolic murmur is present  Pulmonary/Chest: Effort normal and breath sounds normal. No respiratory distress. She has no wheezes. She has no rales. She exhibits no tenderness.  Abdominal: Soft. Bowel sounds are normal. She exhibits no distension. There is no tenderness.  Musculoskeletal: Normal range of motion. She exhibits no edema and no tenderness.  Lymphadenopathy:    She has no cervical adenopathy.  Neurological: She is alert and oriented to person, place, and time. Coordination normal.  Skin: Skin is warm and dry. No rash noted. No erythema.  Psychiatric: She has a normal mood and affect. Her behavior is normal. Judgment and thought content normal.    Assessment and Plan

## 2014-05-15 NOTE — Assessment & Plan Note (Signed)
No dramatic change in her mitral valve pathology on recent echocardiogram. This is likely not the source of her recent lower extremity edema over the past several weeks. I suspect peritoneal dialysis plus her large fluid intake has caused a climb in her dry weight.

## 2014-05-15 NOTE — Assessment & Plan Note (Signed)
Blood pressure well controlled. She did have dialysis today. Out of concern of side effect from amlodipine and leg swelling, we will hold the amlodipine for now. Alternate blood pressure medications could be used if needed.

## 2014-05-15 NOTE — Patient Instructions (Addendum)
Please hold the amlodipine 3 to 4 weeks to see if swelling improves  Please take metolazone 5 mg 30 minutes before the morning lasix  Take sparing   Cut back on your fluids  Please call us if you have new issues that need to be addressed before your next appt.  Your physician wants you to follow-up in: 4 to 5 weeks

## 2014-05-15 NOTE — Assessment & Plan Note (Signed)
Recommended that she hold the amlodipine as this can cause leg edema. Encouraged her to decrease her fluid intake. She might need a lower dry weight on hemodialysis. Symptoms of leg edema started on peritoneal dialysis

## 2014-05-16 ENCOUNTER — Ambulatory Visit: Payer: BC Managed Care – PPO | Admitting: Internal Medicine

## 2014-05-29 ENCOUNTER — Encounter: Payer: Self-pay | Admitting: Internal Medicine

## 2014-05-29 ENCOUNTER — Ambulatory Visit (INDEPENDENT_AMBULATORY_CARE_PROVIDER_SITE_OTHER): Payer: BC Managed Care – PPO | Admitting: Internal Medicine

## 2014-05-29 VITALS — BP 108/68 | HR 89 | Temp 98.2°F | Resp 16 | Ht 70.0 in | Wt 120.8 lb

## 2014-05-29 DIAGNOSIS — J309 Allergic rhinitis, unspecified: Secondary | ICD-10-CM

## 2014-05-29 DIAGNOSIS — I10 Essential (primary) hypertension: Secondary | ICD-10-CM

## 2014-05-29 DIAGNOSIS — R609 Edema, unspecified: Secondary | ICD-10-CM

## 2014-05-29 DIAGNOSIS — E8770 Fluid overload, unspecified: Secondary | ICD-10-CM

## 2014-05-29 DIAGNOSIS — R600 Localized edema: Secondary | ICD-10-CM

## 2014-05-29 DIAGNOSIS — E8779 Other fluid overload: Secondary | ICD-10-CM

## 2014-05-29 DIAGNOSIS — N186 End stage renal disease: Secondary | ICD-10-CM

## 2014-05-29 MED ORDER — FLUTICASONE PROPIONATE 50 MCG/ACT NA SUSP: 2.0000 | Freq: Every day | NASAL | Status: DC

## 2014-05-29 MED ORDER — TIOTROPIUM BROMIDE MONOHYDRATE 18 MCG IN CAPS: 18.0000 ug | ORAL_CAPSULE | Freq: Every day | RESPIRATORY_TRACT | Status: DC

## 2014-05-29 MED ORDER — LACTULOSE 20 GM/30ML PO SOLN: 30.0000 mL | Freq: Four times a day (QID) | ORAL | Status: DC | PRN

## 2014-05-29 NOTE — Progress Notes (Signed)
Pre-visit discussion using our clinic review tool. No additional management support is needed unless otherwise documented below in the visit note.  

## 2014-05-29 NOTE — Assessment & Plan Note (Addendum)
No change in fluid retention without amlodipine so she has resume taking it.  Well controlled on current regimen.

## 2014-05-29 NOTE — Assessment & Plan Note (Signed)
Trial of flonase for persistent symptoms of sneezing., congestion., left ear fullness with normal HEENT exam today

## 2014-05-29 NOTE — Progress Notes (Signed)
Patient ID: Dorothy Long, female   DOB: 1961-10-25, 53 y.o.   MRN: [id number]  Patient Active Problem List   Diagnosis Date Noted  . Allergic rhinitis 05/29/2014  . Bilateral leg edema 05/15/2014  . Candida vaginitis 03/21/2014  . Screening for STD (sexually transmitted disease) 02/01/2014  . Screening for cervical cancer 02/01/2014  . Screening for breast cancer 02/01/2014  . Encounter for preventive health examination 02/01/2014  . Awaiting organ transplant 12/23/2013  . Hyperparathyroidism due to renal insufficiency 12/23/2013  . Excess fluid volume 09/27/2013  . Paroxysmal atrial fibrillation 09/25/2013  . GERD (gastroesophageal reflux disease) 08/12/2013  . Focal and segmental hyalinosis 08/08/2013  . Mitral stenosis with incompetence or regurgitation 07/19/2013  . Anemia in chronic kidney disease 07/19/2013  . End stage kidney disease 07/14/2013  . Tobacco abuse counseling 12/20/2012  . Vitamin D deficiency 07/22/2012  . Hyperlipidemia   . Hypertension 07/21/2012  . Family history of colon cancer 07/21/2012  . Tobacco abuse 07/21/2012    Subjective:  CC:   Chief Complaint  Patient presents with  . Follow-up    fluid overload    HPI:   Dorothy Long is a 53 y.o. female who presents for Hospital follow up.  Patient was admitted to [facility] on May 29 with chest pain and fluid overload. She is receiving hemodialysis,  Last session was today.  She is still using peritoneal dalysis cath to drain her ascites when her abdomen becomes distended.  She is having foot cramps at night and having difficulty staying  hydrated in the heat  Left ear pain and throat sore for the last three days,  No fevers puruelent drainage.  Some allergy symptoms.    Past Medical History  Diagnosis Date  . Hypertension   . Hyperlipidemia   . COPD (chronic obstructive pulmonary disease)   . Anemia   . Mitral valve stenosis, severe   . CHF (congestive heart failure)   . Chronic kidney  disease   . Acute on chronic renal failure   . Diastolic congestive heart failure   . A-fib     Past Surgical History  Procedure Laterality Date  . Abdominal hysterectomy    . Cholecystectomy    . Knee surgery Right   . Foot surgery Bilateral   . Av fistula placement         The following portions of the patient's history were reviewed and updated as appropriate: Allergies, current medications, and problem list.    Review of Systems:   Patient denies headache, fevers, malaise, unintentional weight loss, skin rash, eye pain, sinus congestion and sinus pain, sore throat, dysphagia,  hemoptysis , cough, dyspnea, wheezing, chest pain, palpitations, orthopnea, edema, abdominal pain, nausea, melena, diarrhea, constipation, flank pain, dysuria, hematuria, urinary  Frequency, nocturia, numbness, tingling, seizures,  Focal weakness, Loss of consciousness,  Tremor, insomnia, depression, anxiety, and suicidal ideation.     History   Social History  . Marital Status: Married    Spouse Name: N/A    Number of Children: N/A  . Years of Education: N/A   Occupational History  . Not on file.   Social History Main Topics  . Smoking status: Current Some Day Smoker -- 0.25 packs/day for 20 years    Types: Cigarettes  . Smokeless tobacco: Never Used  . Alcohol Use: No     Comment: occasional  . Drug Use: No  . Sexual Activity: Not on file   Other Topics Concern  . Not on file  Social History Narrative  . No narrative on file    Objective:  Filed Vitals:   05/29/14 0952  BP: 108/68  Pulse: 89  Temp: 98.2 F (36.8 C)  Resp: 16     General appearance: alert, cooperative and appears stated age Ears: normal TM's and external ear canals both ears Throat: lips, mucosa, and tongue normal; teeth and gums normal Neck: no adenopathy, no carotid bruit, supple, symmetrical, trachea midline and thyroid not enlarged, symmetric, no tenderness/mass/nodules Back: symmetric, no  curvature. ROM normal. No CVA tenderness. Lungs: clear to auscultation bilaterally Heart: regular rate and rhythm, S1, S2 normal, no murmur, click, rub or gallop Abdomen: soft, non-tender; bowel sounds normal; no masses,  no organomegaly Pulses: 2+ and symmetric Skin: Skin color, texture, turgor normal. No rashes or lesions Lymph nodes: Cervical, supraclavicular, and axillary nodes normal.  Assessment and Plan:  Hypertension No change in fluid retention without amlodipine so she has resume taking it.  Well controlled on current regimen.   End stage kidney disease Managed by Dr. Cherylann RatelLateef.  Eg cramps at night addressed.  suggested trying one ounce of  tonic water for the quinine.   Allergic rhinitis Trial of flonase for persistent symptoms of sneezing., congestion., left ear fullness with normal HEENT exam today   Excess fluid volume Managed with peritoneal dialysis/hemodialysis.  No change with suspension of amlodipine so she has resumed it.   Bilateral leg edema no change with suspension of amlodipine and bp was not well controlled without it.  Resumed amlodipine    Updated Medication List Outpatient Encounter Prescriptions as of 05/29/2014  Medication Sig  . acetaminophen (TYLENOL) 325 MG tablet Take 325 mg by mouth every 4 (four) hours as needed.   Marland Kitchen. albuterol (VENTOLIN HFA) 108 (90 BASE) MCG/ACT inhaler Inhale into the lungs every 6 (six) hours as needed for wheezing or shortness of breath.  . ALPRAZolam (XANAX) 0.25 MG tablet Take 0.25 mg by mouth 3 (three) times daily as needed for sleep.  Marland Kitchen. amLODipine (NORVASC) 2.5 MG tablet Take 1 tablet (2.5 mg total) by mouth daily.  . Calcium Acetate 667 MG TABS Take 3 capsules by mouth 3 (three) times daily.  . cinacalcet (SENSIPAR) 30 MG tablet Take 30 mg by mouth daily.  . diphenhydrAMINE (BENADRYL) 25 MG tablet Take 25 mg by mouth every other day. MON, WED, FRI BEFORE DIALYSIS  . furosemide (LASIX) 40 MG tablet Take 1 tablet (40 mg  total) by mouth 2 (two) times daily as needed.  Marland Kitchen. HYDROcodone-acetaminophen (NORCO/VICODIN) 5-325 MG per tablet Take 1 tablet by mouth every 6 (six) hours as needed for moderate pain.  . metolazone (ZAROXOLYN) 5 MG tablet Take 1 tablet (5 mg total) by mouth daily as needed.  . metoprolol succinate (TOPROL-XL) 25 MG 24 hr tablet Take 1 tablet (25 mg total) by mouth daily.  Marland Kitchen. tiotropium (SPIRIVA HANDIHALER) 18 MCG inhalation capsule Place 1 capsule (18 mcg total) into inhaler and inhale daily.  . [DISCONTINUED] tiotropium (SPIRIVA HANDIHALER) 18 MCG inhalation capsule Place 18 mcg into inhaler and inhale daily.  . fluticasone (FLONASE) 50 MCG/ACT nasal spray Place 2 sprays into both nostrils daily.  . Lactulose 20 GM/30ML SOLN Take 30 mLs (20 g total) by mouth every 6 (six) hours as needed. To relieve constipation  . [DISCONTINUED] dexlansoprazole (DEXILANT) 60 MG capsule Take 60 mg by mouth daily as needed.  . [DISCONTINUED] Lactulose 20 GM/30ML SOLN Take 30 mLs (20 g total) by mouth every 6 (six) hours as  needed. To relieve constipation     No orders of the defined types were placed in this encounter.    No Follow-up on file.

## 2014-05-29 NOTE — Assessment & Plan Note (Signed)
Managed by Dr. Cherylann RatelLateef.  Eg cramps at night addressed.  suggested trying one ounce of  tonic water for the quinine.

## 2014-05-30 ENCOUNTER — Encounter: Payer: Self-pay | Admitting: Internal Medicine

## 2014-05-30 NOTE — Assessment & Plan Note (Signed)
no change with suspension of amlodipine and bp was not well controlled without it.  Resumed amlodipine

## 2014-05-30 NOTE — Assessment & Plan Note (Addendum)
Managed with peritoneal dialysis/hemodialysis.  No change with suspension of amlodipine so she has resumed it.

## 2014-06-12 ENCOUNTER — Ambulatory Visit: Payer: BC Managed Care – PPO | Admitting: Cardiovascular Disease

## 2014-06-19 ENCOUNTER — Telehealth: Payer: Self-pay | Admitting: Internal Medicine

## 2014-06-19 ENCOUNTER — Encounter: Payer: Self-pay | Admitting: Internal Medicine

## 2014-06-19 ENCOUNTER — Ambulatory Visit (INDEPENDENT_AMBULATORY_CARE_PROVIDER_SITE_OTHER): Payer: BC Managed Care – PPO | Admitting: Internal Medicine

## 2014-06-19 ENCOUNTER — Other Ambulatory Visit: Payer: Self-pay | Admitting: Internal Medicine

## 2014-06-19 VITALS — BP 124/70 | HR 86 | Temp 98.3°F | Resp 18 | Ht 70.0 in | Wt 121.0 lb

## 2014-06-19 DIAGNOSIS — R300 Dysuria: Secondary | ICD-10-CM

## 2014-06-19 DIAGNOSIS — B3731 Acute candidiasis of vulva and vagina: Secondary | ICD-10-CM

## 2014-06-19 DIAGNOSIS — B373 Candidiasis of vulva and vagina: Secondary | ICD-10-CM

## 2014-06-19 DIAGNOSIS — Z124 Encounter for screening for malignant neoplasm of cervix: Secondary | ICD-10-CM

## 2014-06-19 DIAGNOSIS — Z800 Family history of malignant neoplasm of digestive organs: Secondary | ICD-10-CM

## 2014-06-19 DIAGNOSIS — N952 Postmenopausal atrophic vaginitis: Secondary | ICD-10-CM

## 2014-06-19 LAB — URINALYSIS, ROUTINE W REFLEX MICROSCOPIC
BILIRUBIN URINE: NEGATIVE
Ketones, ur: NEGATIVE
Leukocytes, UA: NEGATIVE
Nitrite: NEGATIVE
Specific Gravity, Urine: 1.01 (ref 1.000–1.030)
TOTAL PROTEIN, URINE-UPE24: 100 — AB
UROBILINOGEN UA: 0.2 (ref 0.0–1.0)
Urine Glucose: NEGATIVE
pH: 8 (ref 5.0–8.0)

## 2014-06-19 LAB — POCT URINALYSIS DIPSTICK
Bilirubin, UA: NEGATIVE
Glucose, UA: NEGATIVE
KETONES UA: NEGATIVE
Nitrite, UA: NEGATIVE
SPEC GRAV UA: 1.02
UROBILINOGEN UA: 0.2
pH, UA: 7.5

## 2014-06-19 LAB — MICROALBUMIN / CREATININE URINE RATIO
Creatinine,U: 81.9 mg/dL
Microalb Creat Ratio: 77 mg/g — ABNORMAL HIGH (ref 0.0–30.0)
Microalb, Ur: 63.1 mg/dL — ABNORMAL HIGH (ref 0.0–1.9)

## 2014-06-19 MED ORDER — ESTRADIOL 10 MCG VA TABS: ORAL_TABLET | VAGINAL | Status: DC

## 2014-06-19 NOTE — Assessment & Plan Note (Addendum)
Based on exam today and history . ,  Patient has not been sexually active in years and is s/p TAH/BSO 20 years ago.  Trial of vaginal estrogen

## 2014-06-19 NOTE — Patient Instructions (Signed)

## 2014-06-19 NOTE — Telephone Encounter (Signed)
Error

## 2014-06-19 NOTE — Progress Notes (Signed)
Patient ID: Dorothy Long, female   DOB: 01/04/1961, 53 y.o.   MRN: [id number]   Patient Active Problem List   Diagnosis Date Noted  . Postmenopausal atrophic vaginitis 06/19/2014  . Allergic rhinitis 05/29/2014  . Bilateral leg edema 05/15/2014  . Screening for STD (sexually transmitted disease) 02/01/2014  . Screening for cervical cancer 02/01/2014  . Screening for breast cancer 02/01/2014  . Encounter for preventive health examination 02/01/2014  . Awaiting organ transplant 12/23/2013  . Hyperparathyroidism due to renal insufficiency 12/23/2013  . Excess fluid volume 09/27/2013  . Paroxysmal atrial fibrillation 09/25/2013  . GERD (gastroesophageal reflux disease) 08/12/2013  . Focal and segmental hyalinosis 08/08/2013  . Mitral stenosis with incompetence or regurgitation 07/19/2013  . Anemia in chronic kidney disease 07/19/2013  . End stage kidney disease 07/14/2013  . Tobacco abuse counseling 12/20/2012  . Vitamin D deficiency 07/22/2012  . Hyperlipidemia   . Hypertension 07/21/2012  . Family history of colon cancer 07/21/2012  . Tobacco abuse 07/21/2012    Subjective:  CC:   Chief Complaint  Patient presents with  . Dysuria    Vaginal itching, burning, discharge from vaginal area and rectum.  . Polyuria    nausea , sweats , chills.    HPI:   Dorothy Long is a 53 y.o. female who presents for vaginal discharge, burning and itching   Sometimes clear, simetimes white.Not sexually active. Also having low back pain and nausea, Going on since Thursday (4 days) . Using peritoneal dialysis. No abdominal distension.  Was hospitalized several months ago with a drug resistant UTI, worried she has another one.      Past Medical History  Diagnosis Date  . Hypertension   . Hyperlipidemia   . COPD (chronic obstructive pulmonary disease)   . Anemia   . Mitral valve stenosis, severe   . CHF (congestive heart failure)   . Chronic kidney disease   . Acute on chronic  renal failure   . Diastolic congestive heart failure   . A-fib     Past Surgical History  Procedure Laterality Date  . Abdominal hysterectomy    . Cholecystectomy    . Knee surgery Right   . Foot surgery Bilateral   . Av fistula placement         The following portions of the patient's history were reviewed and updated as appropriate: Allergies, current medications, and problem list.    Review of Systems:   Patient denies headache, fevers, malaise, unintentional weight loss, skin rash, eye pain, sinus congestion and sinus pain, sore throat, dysphagia,  hemoptysis , cough, dyspnea, wheezing, chest pain, palpitations, orthopnea, edema, abdominal pain, nausea, melena, diarrhea, constipation, flank pain, dysuria, hematuria, urinary  Frequency, nocturia, numbness, tingling, seizures,  Focal weakness, Loss of consciousness,  Tremor, insomnia, depression, anxiety, and suicidal ideation.     History   Social History  . Marital Status: Married    Spouse Name: N/A    Number of Children: N/A  . Years of Education: N/A   Occupational History  . Not on file.   Social History Main Topics  . Smoking status: Current Some Day Smoker -- 0.25 packs/day for 20 years    Types: Cigarettes  . Smokeless tobacco: Never Used  . Alcohol Use: No     Comment: occasional  . Drug Use: No  . Sexual Activity: No   Other Topics Concern  . Not on file   Social History Narrative  . No narrative on file  Objective:  Filed Vitals:   06/19/14 1127  BP: 124/70  Pulse: 86  Temp: 98.3 F (36.8 C)  Resp: 18   General Appearance:    Alert, cooperative, no distress, appears stated age  Head:    Normocephalic, without obvious abnormality, atraumatic  Eyes:    PERRL, conjunctiva/corneas clear, EOM's intact, fundi    benign, both eyes  Ears:    Normal TM's and external ear canals, both ears  Nose:   Nares normal, septum midline, mucosa normal, no drainage    or sinus tenderness  Throat:    Lips, mucosa, and tongue normal; teeth and gums normal  Neck:   Supple, symmetrical, trachea midline, no adenopathy;    thyroid:  no enlargement/tenderness/nodules; no carotid   bruit or JVD  Back:     Symmetric, no curvature, ROM normal, no CVA tenderness  Lungs:     Clear to auscultation bilaterally, respirations unlabored  Chest Wall:    No tenderness or deformity   Heart:    Regular rate and rhythm, S1 and S2 normal, no murmur, rub   or gallop  Breast Exam:    No tenderness, masses, or nipple abnormality  Abdomen:     Soft, non-tender, bowel sounds active all four quadrants,    no masses, no organomegaly  Genitalia:    Pelvic: uterus and cervix absent , vaginal walls atrophic,  Thin nonodourous white discharge, no adnexal masses or tenderness, rectovaginal septum normal.    Extremities:   Extremities normal, atraumatic, no cyanosis or edema  Pulses:   2+ and symmetric all extremities  Skin:   Skin color, texture, turgor normal, no rashes or lesions  Lymph nodes:   Cervical, supraclavicular, and axillary nodes normal  Neurologic:   CNII-XII intact, normal strength, sensation and reflexes    throughout    Assessment and Plan:  Postmenopausal atrophic vaginitis Based on exam today and history . ,  Patient has not been sexually active in years and is s/p TAH/BSO 20 years ago.  Trial of vaginal estrogen   Screening for cervical cancer She is s/p TAH/BSO .,  PAP smear done Feb confirmed absence of cervix.    Family history of colon cancer She is overdue for 5 yr follow up colonoscopy (Wohl 2009) and was referred to Dr. Evette CristalSankar in January but was not seen due to frequent hospitalizations for fluid overload secondary to CKD .  She needs to have this done prior to organ transplant    Updated Medication List Outpatient Encounter Prescriptions as of 06/19/2014  Medication Sig  . acetaminophen (TYLENOL) 325 MG tablet Take 325 mg by mouth every 4 (four) hours as needed.   Marland Kitchen. albuterol  (VENTOLIN HFA) 108 (90 BASE) MCG/ACT inhaler Inhale into the lungs every 6 (six) hours as needed for wheezing or shortness of breath.  . ALPRAZolam (XANAX) 0.25 MG tablet Take 0.25 mg by mouth 3 (three) times daily as needed for sleep.  . Calcium Acetate 667 MG TABS Take 3 capsules by mouth 3 (three) times daily.  . cinacalcet (SENSIPAR) 30 MG tablet Take 30 mg by mouth daily.  . diphenhydrAMINE (BENADRYL) 25 MG tablet Take 25 mg by mouth every other day. MON, WED, FRI BEFORE DIALYSIS  . fluticasone (FLONASE) 50 MCG/ACT nasal spray Place 2 sprays into both nostrils daily.  . furosemide (LASIX) 40 MG tablet Take 1 tablet (40 mg total) by mouth 2 (two) times daily as needed.  Marland Kitchen. HYDROcodone-acetaminophen (NORCO/VICODIN) 5-325 MG per tablet Take 1 tablet  by mouth every 6 (six) hours as needed for moderate pain.  . Lactulose 20 GM/30ML SOLN Take 30 mLs (20 g total) by mouth every 6 (six) hours as needed. To relieve constipation  . metolazone (ZAROXOLYN) 5 MG tablet Take 1 tablet (5 mg total) by mouth daily as needed.  . metoprolol succinate (TOPROL-XL) 25 MG 24 hr tablet Take 1 tablet (25 mg total) by mouth daily.  Marland Kitchen. tiotropium (SPIRIVA HANDIHALER) 18 MCG inhalation capsule Place 1 capsule (18 mcg total) into inhaler and inhale daily.  . Estradiol 10 MCG TABS vaginal tablet Inset 1 tablet every night for 2 weeks,  Then reduce use to twice weekly  . [DISCONTINUED] amLODipine (NORVASC) 2.5 MG tablet Take 1 tablet (2.5 mg total) by mouth daily.     Orders Placed This Encounter  Procedures  . HM MAMMOGRAPHY  . Urine Microalbumin w/creat. ratio  . Urinalysis, Routine w reflex microscopic  . HM PAP SMEAR  . POCT Urinalysis Dipstick  . HM COLONOSCOPY    No Follow-up on file.

## 2014-06-20 ENCOUNTER — Encounter: Payer: Self-pay | Admitting: Internal Medicine

## 2014-06-20 ENCOUNTER — Telehealth: Payer: Self-pay | Admitting: Internal Medicine

## 2014-06-20 NOTE — Assessment & Plan Note (Signed)
She is s/p TAH/BSO .,  PAP smear done Feb confirmed absence of cervix.

## 2014-06-20 NOTE — Assessment & Plan Note (Addendum)
She is overdue for 5 yr follow up colonoscopy (Wohl 2009) and was referred to Dr. Evette CristalSankar in January but was not seen due to frequent hospitalizations for fluid overload secondary to CKD .  She needs to have this done prior to organ transplant

## 2014-06-20 NOTE — Telephone Encounter (Signed)
Relevant patient education mailed to patient.  

## 2014-06-24 ENCOUNTER — Emergency Department: Payer: Self-pay | Admitting: Internal Medicine

## 2014-06-29 ENCOUNTER — Encounter: Payer: Self-pay | Admitting: Cardiovascular Disease

## 2014-06-29 ENCOUNTER — Ambulatory Visit (INDEPENDENT_AMBULATORY_CARE_PROVIDER_SITE_OTHER): Payer: BC Managed Care – PPO | Admitting: Cardiovascular Disease

## 2014-06-29 VITALS — BP 138/86 | HR 89 | Ht 70.0 in | Wt 126.2 lb

## 2014-06-29 DIAGNOSIS — I480 Paroxysmal atrial fibrillation: Secondary | ICD-10-CM

## 2014-06-29 DIAGNOSIS — N186 End stage renal disease: Secondary | ICD-10-CM

## 2014-06-29 DIAGNOSIS — I4891 Unspecified atrial fibrillation: Secondary | ICD-10-CM

## 2014-06-29 DIAGNOSIS — I10 Essential (primary) hypertension: Secondary | ICD-10-CM

## 2014-06-29 DIAGNOSIS — I052 Rheumatic mitral stenosis with insufficiency: Secondary | ICD-10-CM

## 2014-06-29 NOTE — Assessment & Plan Note (Signed)
Maintaining normal sinus rhythm. Suggested she continue on her metoprolol

## 2014-06-29 NOTE — Progress Notes (Signed)
Patient ID: Dorothy Long, female    DOB: Nov 07, 1961, 53 y.o.   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  In followup today, she reports that she is still training for peritoneal dialysis. AV graft on the left. Trace ankle edema worse on the right. Recent episode of severe numbness on the plantar aspect of both feet. She is wearing running shoes at the time when symptoms started She feels that she has fluid in her abdomen. Denies having any significant shortness of breath.  She was seen in the hospital may 21st 2015 for atypical chest pain. Negative cardiac enzymes, repeat echocardiogram showing stable mild to moderate mitral valve stenosis and moderate regurgitation, rheumatic valve.   Previously on amlodipine which cause significant lower extremity edema   AV fistula upper extremity (11/07/13)  Hospital notes indicate severe anemia on October sixth 2014 Admission to the hospital 09/21/2013 for atrial fibrillation in the setting of diarrhea  Echocardiogram 09/26/2013 at [facility]  Low normal left ventricular systolic function, diastolic dysfunction, Biatrial enlargement Mild aortic regurgitation The mitral leaflets are thickened and appear rheumaticically deformed. There is heavy mitral annular calcification. The there is mild mitral stenosis with a calculated valve area of 1.6 cm2 by pressure half-time equation and moderate, eccentric mitral regurgitation Mild to moderate pulmonary hypertension   Stress test 09/28/13 IMPRESSION:  1. No convincing evidence of pharmacologically induced myocardial ischemia.  2. Apparent  anterior apical defect that is present only on attenuation corrected images is favored to reflect artifact due to overcorrection and may be worse on stress imaging secondary to greater patient motion. A small area of ischemia is felt less likely but is difficult to entirely exclude.  3. LVEF = 55%.  Notes indicate she had biopsy done one year ago showing focal segmental glomerulosclerosis at [facility] and saw Dr. Wynelle Link one year ago. Creatinine at that time was 1.8 In the hospital recently creatinine 5-6  Ultrasound in the hospital showed atrophic right kidney, very thin renal cortex, biopsy could not be done successfully She was started on steroids by Dr. Thedore Mins plan to discharge home to finish her steroid course. She was given Neupogen while in the hospital It was recommended that she stop smoking.  EKG shows normal sinus rhythm with rate 91 beats per minute poor R-wave progression through the anterior precordial leads, nonspecific ST abnormality , PVCs noted in a trigeminal pattern  Outpatient Encounter Prescriptions as of 06/29/2014  Medication Sig  . acetaminophen (TYLENOL) 325 MG tablet Take 325 mg by mouth every 4 (four) hours as needed.   Marland Kitchen albuterol (VENTOLIN HFA) 108 (90 BASE) MCG/ACT inhaler Inhale into the lungs every 6 (six) hours as needed for wheezing or shortness of breath.  . ALPRAZolam (XANAX) 0.25 MG tablet Take 0.25 mg by mouth 3 (three) times daily as needed for sleep.  . Calcium Acetate 667 MG TABS Take 3 capsules by mouth 3 (three) times daily.  . cinacalcet (SENSIPAR) 30 MG tablet Take 30 mg by mouth daily.  . diphenhydrAMINE (BENADRYL) 25 MG tablet Take 25 mg by mouth every other day. MON, WED, FRI BEFORE DIALYSIS  . Estradiol 10 MCG TABS vaginal tablet Inset 1  tablet every night for 2 weeks,  Then reduce use to twice weekly  . fluticasone (FLONASE) 50 MCG/ACT nasal spray Place 2 sprays into both nostrils daily.  . furosemide (LASIX) 40 MG tablet Take 1 tablet (40 mg total) by  mouth 2 (two) times daily as needed.  Marland Kitchen. HYDROcodone-acetaminophen (NORCO/VICODIN) 5-325 MG per tablet Take 1 tablet by mouth every 6 (six) hours as needed for moderate pain.  . Lactulose 20 GM/30ML SOLN Take 30 mLs (20 g total) by mouth every 6 (six) hours as needed. To relieve constipation  . metolazone (ZAROXOLYN) 5 MG tablet Take 1 tablet (5 mg total) by mouth daily as needed.  . metoprolol succinate (TOPROL-XL) 25 MG 24 hr tablet Take 1 tablet (25 mg total) by mouth daily.  Marland Kitchen. tiotropium (SPIRIVA HANDIHALER) 18 MCG inhalation capsule Place 1 capsule (18 mcg total) into inhaler and inhale daily.    Review of Systems  Constitutional: Negative.   HENT: Negative.   Eyes: Negative.   Respiratory: Negative.   Cardiovascular: Positive for leg swelling.       Minimal ankle swelling  Gastrointestinal: Negative.   Endocrine: Negative.   Musculoskeletal: Negative.   Skin: Negative.   Allergic/Immunologic: Negative.   Neurological: Negative.   Hematological: Negative.   Psychiatric/Behavioral: Negative.   All other systems reviewed and are negative.  BP 138/86  Pulse 89  Ht 5\' 10"  (1.778 m)  Wt 126 lb 4 oz (57.267 kg)  BMI 18.12 kg/m2  Physical Exam  Nursing note and vitals reviewed. Constitutional: She is oriented to person, place, and time. She appears well-developed and well-nourished.  HENT:  Head: Normocephalic.  Nose: Nose normal.  Mouth/Throat: Oropharynx is clear and moist.  Eyes: Conjunctivae are normal. Pupils are equal, round, and reactive to light.  Neck: Normal range of motion. Neck supple. No JVD present.  Cardiovascular: Normal rate, regular rhythm, S1 normal, S2 normal and intact distal pulses.  Exam reveals no gallop and no friction rub.   Murmur heard.  Systolic murmur is present with a grade of 3/6   Diastolic murmur is present  Pulmonary/Chest: Effort normal and breath sounds normal. No respiratory distress. She has no wheezes. She has no rales. She exhibits no  tenderness.  Abdominal: Soft. Bowel sounds are normal. She exhibits no distension. There is no tenderness.  Musculoskeletal: Normal range of motion. She exhibits no edema and no tenderness.  Lymphadenopathy:    She has no cervical adenopathy.  Neurological: She is alert and oriented to person, place, and time. Coordination normal.  Skin: Skin is warm and dry. No rash noted. No erythema.  Psychiatric: She has a normal mood and affect. Her behavior is normal. Judgment and thought content normal.    Assessment and Plan

## 2014-06-29 NOTE — Assessment & Plan Note (Signed)
Hemodialysis 3 days per week. Training for peritoneal dialysis. Followup with renal service

## 2014-06-29 NOTE — Assessment & Plan Note (Signed)
Repeat echocardiogram in 2016. Currently stable with no indications for intervention. No significant symptoms of shortness of breath, likely assisted by hemodialysis and strict dry weight

## 2014-06-29 NOTE — Patient Instructions (Signed)
You are doing well. No medication changes were made.  Please call us if you have new issues that need to be addressed before your next appt.  Your physician wants you to follow-up in: 6 months.  You will receive a reminder letter in the mail two months in advance. If you don't receive a letter, please call our office to schedule the follow-up appointment.   

## 2014-07-09 ENCOUNTER — Emergency Department: Payer: Self-pay | Admitting: Emergency Medicine

## 2014-07-09 LAB — CBC
HCT: 31.3 % — ABNORMAL LOW (ref 35.0–47.0)
HGB: 10.3 g/dL — AB (ref 12.0–16.0)
MCH: 30 pg (ref 26.0–34.0)
MCHC: 33.1 g/dL (ref 32.0–36.0)
MCV: 91 fL (ref 80–100)
PLATELETS: 140 10*3/uL — AB (ref 150–440)
RBC: 3.44 10*6/uL — ABNORMAL LOW (ref 3.80–5.20)
RDW: 17.3 % — AB (ref 11.5–14.5)
WBC: 4.9 10*3/uL (ref 3.6–11.0)

## 2014-07-09 LAB — BASIC METABOLIC PANEL
ANION GAP: 13 (ref 7–16)
BUN: 33 mg/dL — ABNORMAL HIGH (ref 7–18)
CALCIUM: 8.2 mg/dL — AB (ref 8.5–10.1)
CO2: 23 mmol/L (ref 21–32)
Chloride: 101 mmol/L (ref 98–107)
Creatinine: 5.97 mg/dL — ABNORMAL HIGH (ref 0.60–1.30)
EGFR (African American): 9 — ABNORMAL LOW
EGFR (Non-African Amer.): 8 — ABNORMAL LOW
GLUCOSE: 79 mg/dL (ref 65–99)
Osmolality: 280 (ref 275–301)
Potassium: 3.7 mmol/L (ref 3.5–5.1)
SODIUM: 137 mmol/L (ref 136–145)

## 2014-07-09 LAB — TROPONIN I: TROPONIN-I: 0.05 ng/mL

## 2014-07-11 ENCOUNTER — Ambulatory Visit: Payer: Self-pay | Admitting: Vascular Surgery

## 2014-07-28 ENCOUNTER — Other Ambulatory Visit: Payer: Self-pay | Admitting: Internal Medicine

## 2014-11-06 ENCOUNTER — Ambulatory Visit (INDEPENDENT_AMBULATORY_CARE_PROVIDER_SITE_OTHER): Payer: BC Managed Care – PPO | Admitting: Internal Medicine

## 2014-11-06 ENCOUNTER — Encounter: Payer: Self-pay | Admitting: Internal Medicine

## 2014-11-06 ENCOUNTER — Encounter (INDEPENDENT_AMBULATORY_CARE_PROVIDER_SITE_OTHER): Payer: Self-pay

## 2014-11-06 ENCOUNTER — Telehealth: Payer: Self-pay

## 2014-11-06 VITALS — BP 126/82 | HR 76 | Temp 98.3°F | Ht 70.0 in | Wt 122.0 lb

## 2014-11-06 DIAGNOSIS — J0100 Acute maxillary sinusitis, unspecified: Secondary | ICD-10-CM

## 2014-11-06 MED ORDER — PREDNISONE (PAK) 10 MG PO TABS: ORAL_TABLET | ORAL | Status: DC

## 2014-11-06 MED ORDER — LEVOFLOXACIN 500 MG PO TABS: 500.0000 mg | ORAL_TABLET | Freq: Every day | ORAL | Status: DC

## 2014-11-06 MED ORDER — MOMETASONE FUROATE 0.1 % EX CREA: 1.0000 "application " | TOPICAL_CREAM | Freq: Every day | CUTANEOUS | Status: DC

## 2014-11-06 NOTE — Telephone Encounter (Signed)
Appt scheduled for tonight,  verbalized understanding

## 2014-11-06 NOTE — Telephone Encounter (Signed)
Spoke with patient, complaining of sore throat and productive cough, green-yellow, x 2 weeks. Temp 98.9. Has been taking tylenol, flonase, and Zyrtec with persistent symptoms. Only would see Dr. Darrick Huntsmanullo, after 2:30 if possible.

## 2014-11-06 NOTE — Progress Notes (Signed)
Pre visit review using our clinic review tool, if applicable. No additional management support is needed unless otherwise documented below in the visit note. 

## 2014-11-06 NOTE — Telephone Encounter (Signed)
The patient called stating her, and her son (D.Mezquita - also a pt of Dr.Tullo), need appointments for possible sinus infections.  I asked her to schedule with C.Doss,but she refused stating she would only see Dr.Tullo.  Please advise if you want two patients worked, and where. Thanks!

## 2014-11-06 NOTE — Telephone Encounter (Signed)
We are moving the two retired people taking up the evning slots so Dorothy Long and her son can be seen tonight

## 2014-11-06 NOTE — Patient Instructions (Signed)
You have a sinus/ear infection   .  I am prescribing an antibiotic (levaquin) and prednisone taper  To manage the infectin and the inflammation in your ear/sinuses.   I also advise use of the following OTC meds to help with your other symptoms.   Take generic OTC benadryl 25 mg at night  for the drainage,  Sudafed PE  10 mg every 8 hours for the congestion, you may substitute Afrin nasal spray for the nighttime dose of sudafed PE  If needed to prevent insomnia.  flush your sinuses twice daily with Simply Saline (do over the sink because if you do it right you will spit out globs of mucus)  Use benzonatate capsules   FOR THE COUGH.  Gargle with salt water as needed for sore throat.   Please take a probiotic ( Align, Floraque or Culturelle) while you are on the antibiotic to prevent a serious antibiotic associated diarrhea  Called clostirudium dificile colitis and a vaginal yeast infection  For at least two weeks

## 2014-11-07 ENCOUNTER — Emergency Department: Payer: Self-pay | Admitting: Emergency Medicine

## 2014-11-07 DIAGNOSIS — N390 Urinary tract infection, site not specified: Secondary | ICD-10-CM | POA: Insufficient documentation

## 2014-11-07 DIAGNOSIS — J0100 Acute maxillary sinusitis, unspecified: Secondary | ICD-10-CM | POA: Insufficient documentation

## 2014-11-07 LAB — CBC
HCT: 34.3 % — ABNORMAL LOW (ref 35.0–47.0)
HGB: 11.2 g/dL — AB (ref 12.0–16.0)
MCH: 28.2 pg (ref 26.0–34.0)
MCHC: 32.6 g/dL (ref 32.0–36.0)
MCV: 87 fL (ref 80–100)
Platelet: 161 10*3/uL (ref 150–440)
RBC: 3.96 10*6/uL (ref 3.80–5.20)
RDW: 16.4 % — AB (ref 11.5–14.5)
WBC: 9.3 10*3/uL (ref 3.6–11.0)

## 2014-11-07 LAB — COMPREHENSIVE METABOLIC PANEL
ALBUMIN: 2.8 g/dL — AB (ref 3.4–5.0)
AST: 34 U/L (ref 15–37)
Alkaline Phosphatase: 134 U/L — ABNORMAL HIGH
Anion Gap: 13 (ref 7–16)
BILIRUBIN TOTAL: 0.5 mg/dL (ref 0.2–1.0)
BUN: 71 mg/dL — ABNORMAL HIGH (ref 7–18)
CALCIUM: 7.1 mg/dL — AB (ref 8.5–10.1)
CHLORIDE: 102 mmol/L (ref 98–107)
Co2: 19 mmol/L — ABNORMAL LOW (ref 21–32)
Creatinine: 8 mg/dL — ABNORMAL HIGH (ref 0.60–1.30)
EGFR (African American): 7 — ABNORMAL LOW
EGFR (Non-African Amer.): 6 — ABNORMAL LOW
GLUCOSE: 133 mg/dL — AB (ref 65–99)
Osmolality: 291 (ref 275–301)
Potassium: 5.5 mmol/L — ABNORMAL HIGH (ref 3.5–5.1)
SGPT (ALT): 24 U/L
SODIUM: 134 mmol/L — AB (ref 136–145)
Total Protein: 7.1 g/dL (ref 6.4–8.2)

## 2014-11-07 NOTE — Progress Notes (Signed)
Patient ID: Dorothy Long, female   DOB: August 30, 1961, 53 y.o.   MRN: [id number]  Patient Active Problem List   Diagnosis Date Noted  . Infection of urinary tract 11/07/2014  . Sinusitis, acute, maxillary 11/07/2014  . Postmenopausal atrophic vaginitis 06/19/2014  . Allergic rhinitis 05/29/2014  . Bilateral leg edema 05/15/2014  . Screening for STD (sexually transmitted disease) 02/01/2014  . Screening for cervical cancer 02/01/2014  . Screening for breast cancer 02/01/2014  . Encounter for preventive health examination 02/01/2014  . Awaiting organ transplant 12/23/2013  . Hyperparathyroidism due to renal insufficiency 12/23/2013  . Excess fluid volume 09/27/2013  . Paroxysmal atrial fibrillation 09/25/2013  . GERD (gastroesophageal reflux disease) 08/12/2013  . Focal and segmental hyalinosis 08/08/2013  . Mitral stenosis with incompetence or regurgitation 07/19/2013  . Anemia in chronic kidney disease 07/19/2013  . End stage kidney disease 07/14/2013  . Tobacco abuse counseling 12/20/2012  . Vitamin D deficiency 07/22/2012  . Hyperlipidemia   . Hypertension 07/21/2012  . Family history of colon cancer 07/21/2012  . Tobacco abuse 07/21/2012    Subjective:  CC:   Chief Complaint  Patient presents with  . Sore Throat  . Cough  . Ear Pain    both ears     HPI:   Dorothy Long is a 53 y.o. female who presents for  Scratchy throat started a week ago  Accompanied by dry cough.  For the last 7 days having a lot of sinus pressure accompanied by sneezing and rhinorrhea with clear to yellow discharge.  Ears have been "popping" but not painful, no loss of hearing or discharge,  No fevers, but having malaise and cough.  No recent travel.  Positive sick contacts.    Past Medical History  Diagnosis Date  . Hypertension   . Hyperlipidemia   . COPD (chronic obstructive pulmonary disease)   . Anemia   . Mitral valve stenosis, severe   . CHF (congestive heart failure)   .  Chronic kidney disease   . Acute on chronic renal failure   . Diastolic congestive heart failure   . A-fib     Past Surgical History  Procedure Laterality Date  . Abdominal hysterectomy    . Cholecystectomy    . Knee surgery Right   . Foot surgery Bilateral   . Av fistula placement         The following portions of the patient's history were reviewed and updated as appropriate: Allergies, current medications, and problem list.    Review of Systems:   Patient denies headache, fevers, malaise, unintentional weight loss, skin rash, eye pain, sinus congestion and sinus pain, sore throat, dysphagia,  hemoptysis , cough, dyspnea, wheezing, chest pain, palpitations, orthopnea, edema, abdominal pain, nausea, melena, diarrhea, constipation, flank pain, dysuria, hematuria, urinary  Frequency, nocturia, numbness, tingling, seizures,  Focal weakness, Loss of consciousness,  Tremor, insomnia, depression, anxiety, and suicidal ideation.     History   Social History  . Marital Status: Married    Spouse Name: N/A    Number of Children: N/A  . Years of Education: N/A   Occupational History  . Not on file.   Social History Main Topics  . Smoking status: Current Some Day Smoker -- 0.25 packs/day for 20 years    Types: Cigarettes  . Smokeless tobacco: Never Used  . Alcohol Use: No     Comment: occasional  . Drug Use: No  . Sexual Activity: No   Other Topics Concern  .  Not on file   Social History Narrative    Objective:  Filed Vitals:   11/06/14 1745  BP: 126/82  Pulse: 76  Temp: 98.3 F (36.8 C)     General appearance: alert, cooperative and appears stated age Ears: bilateral enflamed  TM's and irritated external ear canals both ears Throat: lips, mucosa, and tongue normal; teeth and gums normal.  Erythematous tonsillar pillars  Neck: no adenopathy, no carotid bruit, supple, symmetrical, trachea midline and thyroid not enlarged, symmetric, no  tenderness/mass/nodules Back: symmetric, no curvature. ROM normal. No CVA tenderness. Lungs: clear to auscultation bilaterally Heart: regular rate and rhythm, S1, S2 normal, no murmur, click, rub or gallop Abdomen: soft, non-tender; bowel sounds normal; no masses,  no organomegaly Pulses: 2+ and symmetric Skin: Skin color, texture, turgor normal. No rashes or lesions Lymph nodes: Cervical, supraclavicular, and axillary nodes normal.  Assessment and Plan:  Sinusitis, acute, maxillary Given chronicity of symptoms, development of facial pain and exam consistent with bacterial URI,  Will treat with empiric antibiotics, decongestants, and saline lavage.  Adding steroid nasal spray if not already taking.    Updated Medication List Outpatient Encounter Prescriptions as of 11/06/2014  Medication Sig  . acetaminophen (TYLENOL) 325 MG tablet Take 325 mg by mouth every 4 (four) hours as needed.   Marland Kitchen. albuterol (VENTOLIN HFA) 108 (90 BASE) MCG/ACT inhaler Inhale into the lungs every 6 (six) hours as needed for wheezing or shortness of breath.  . ALPRAZolam (XANAX) 0.25 MG tablet Take 0.25 mg by mouth 3 (three) times daily as needed for sleep.  . Calcium Acetate 667 MG TABS Take 3 capsules by mouth 3 (three) times daily.  . cinacalcet (SENSIPAR) 30 MG tablet Take 30 mg by mouth daily.  . diphenhydrAMINE (BENADRYL) 25 MG tablet Take 25 mg by mouth every other day. MON, WED, FRI BEFORE DIALYSIS  . Estradiol 10 MCG TABS vaginal tablet Inset 1 tablet every night for 2 weeks,  Then reduce use to twice weekly  . fluticasone (FLONASE) 50 MCG/ACT nasal spray Place 2 sprays into both nostrils daily.  . furosemide (LASIX) 40 MG tablet Take 1 tablet (40 mg total) by mouth 2 (two) times daily as needed.  Marland Kitchen. HYDROcodone-acetaminophen (NORCO/VICODIN) 5-325 MG per tablet Take 1 tablet by mouth every 6 (six) hours as needed for moderate pain.  . Lactulose 20 GM/30ML SOLN Take 30 mLs (20 g total) by mouth every 6 (six)  hours as needed. To relieve constipation  . metolazone (ZAROXOLYN) 5 MG tablet Take 1 tablet (5 mg total) by mouth daily as needed.  . metoprolol succinate (TOPROL-XL) 25 MG 24 hr tablet TAKE 1 TABLET (25 MG TOTAL) BY MOUTH DAILY.  Marland Kitchen. tiotropium (SPIRIVA HANDIHALER) 18 MCG inhalation capsule Place 1 capsule (18 mcg total) into inhaler and inhale daily.  Marland Kitchen. levofloxacin (LEVAQUIN) 500 MG tablet Take 1 tablet (500 mg total) by mouth daily.  . mometasone (ELOCON) 0.1 % cream Apply 1 application topically daily. To ear canal  . predniSONE (STERAPRED UNI-PAK) 10 MG tablet 6 tablets on Day 1 , then reduce by 1 tablet daily until gone     No orders of the defined types were placed in this encounter.    No Follow-up on file.

## 2014-11-07 NOTE — Assessment & Plan Note (Signed)
Given chronicity of symptoms, development of facial pain and exam consistent with bacterial URI,  Will treat with empiric antibiotics, decongestants, and saline lavage.  Adding steroid nasal spray if not already taking.  °

## 2014-11-12 ENCOUNTER — Inpatient Hospital Stay: Payer: Self-pay | Admitting: Internal Medicine

## 2014-11-12 LAB — BASIC METABOLIC PANEL
Anion Gap: 13 (ref 7–16)
BUN: 61 mg/dL — ABNORMAL HIGH (ref 7–18)
CALCIUM: 7.3 mg/dL — AB (ref 8.5–10.1)
CREATININE: 8.48 mg/dL — AB (ref 0.60–1.30)
Chloride: 103 mmol/L (ref 98–107)
Co2: 19 mmol/L — ABNORMAL LOW (ref 21–32)
EGFR (African American): 6 — ABNORMAL LOW
EGFR (Non-African Amer.): 5 — ABNORMAL LOW
Glucose: 104 mg/dL — ABNORMAL HIGH (ref 65–99)
Osmolality: 288 (ref 275–301)
POTASSIUM: 3.9 mmol/L (ref 3.5–5.1)
Sodium: 135 mmol/L — ABNORMAL LOW (ref 136–145)

## 2014-11-12 LAB — DIFFERENTIAL
BASOS PCT: 0.5 %
Basophil #: 0 10*3/uL (ref 0.0–0.1)
Eosinophil #: 0 10*3/uL (ref 0.0–0.7)
Eosinophil %: 0.9 %
Lymphocyte #: 0.4 10*3/uL — ABNORMAL LOW (ref 1.0–3.6)
Lymphocyte %: 8.4 %
MONO ABS: 0.4 x10 3/mm (ref 0.2–0.9)
Monocyte %: 8.3 %
Neutrophil #: 4.4 10*3/uL (ref 1.4–6.5)
Neutrophil %: 81.9 %

## 2014-11-12 LAB — CBC
HCT: 31.8 % — ABNORMAL LOW (ref 35.0–47.0)
HGB: 10.4 g/dL — ABNORMAL LOW (ref 12.0–16.0)
MCH: 28.3 pg (ref 26.0–34.0)
MCHC: 32.8 g/dL (ref 32.0–36.0)
MCV: 86 fL (ref 80–100)
Platelet: 133 10*3/uL — ABNORMAL LOW (ref 150–440)
RBC: 3.7 10*6/uL — AB (ref 3.80–5.20)
RDW: 16.2 % — ABNORMAL HIGH (ref 11.5–14.5)
WBC: 5.4 10*3/uL (ref 3.6–11.0)

## 2014-11-13 LAB — CBC WITH DIFFERENTIAL/PLATELET
Basophil #: 0 10*3/uL (ref 0.0–0.1)
Basophil %: 0.2 %
EOS PCT: 0 %
Eosinophil #: 0 10*3/uL (ref 0.0–0.7)
HCT: 30.1 % — AB (ref 35.0–47.0)
HGB: 9.9 g/dL — AB (ref 12.0–16.0)
Lymphocyte #: 0.4 10*3/uL — ABNORMAL LOW (ref 1.0–3.6)
Lymphocyte %: 6.1 %
MCH: 28.1 pg (ref 26.0–34.0)
MCHC: 32.8 g/dL (ref 32.0–36.0)
MCV: 86 fL (ref 80–100)
Monocyte #: 0.4 x10 3/mm (ref 0.2–0.9)
Monocyte %: 6.1 %
NEUTROS PCT: 87.6 %
Neutrophil #: 5.9 10*3/uL (ref 1.4–6.5)
Platelet: 119 10*3/uL — ABNORMAL LOW (ref 150–440)
RBC: 3.51 10*6/uL — ABNORMAL LOW (ref 3.80–5.20)
RDW: 16.7 % — ABNORMAL HIGH (ref 11.5–14.5)
WBC: 6.8 10*3/uL (ref 3.6–11.0)

## 2014-11-13 LAB — CLOSTRIDIUM DIFFICILE(ARMC)

## 2014-11-13 LAB — BASIC METABOLIC PANEL
Anion Gap: 15 (ref 7–16)
BUN: 65 mg/dL — AB (ref 7–18)
CREATININE: 8.7 mg/dL — AB (ref 0.60–1.30)
Calcium, Total: 6.8 mg/dL — CL (ref 8.5–10.1)
Chloride: 106 mmol/L (ref 98–107)
Co2: 18 mmol/L — ABNORMAL LOW (ref 21–32)
EGFR (African American): 6 — ABNORMAL LOW
EGFR (Non-African Amer.): 5 — ABNORMAL LOW
Glucose: 145 mg/dL — ABNORMAL HIGH (ref 65–99)
OSMOLALITY: 299 (ref 275–301)
Potassium: 3.9 mmol/L (ref 3.5–5.1)
Sodium: 139 mmol/L (ref 136–145)

## 2014-11-14 LAB — BODY FLUID CELL COUNT WITH DIFFERENTIAL
Basophil: 0 %
EOS PCT: 0 %
Lymphocytes: 84 %
NUCLEATED CELL COUNT: 22 /mm3
Neutrophils: 4 %
OTHER MONONUCLEAR CELLS: 12 %
Other Cells BF: 0 %

## 2014-11-14 LAB — PHOSPHORUS: Phosphorus: 8.5 mg/dL — ABNORMAL HIGH (ref 2.5–4.9)

## 2014-11-15 ENCOUNTER — Emergency Department: Payer: Self-pay | Admitting: Emergency Medicine

## 2014-11-15 LAB — BASIC METABOLIC PANEL
ANION GAP: 17 — AB (ref 7–16)
BUN: 75 mg/dL — AB (ref 4–21)
BUN: 75 mg/dL — AB (ref 7–18)
CALCIUM: 6.1 mg/dL — AB (ref 8.5–10.1)
CREATININE: 8.9 mg/dL — AB (ref 0.5–1.1)
Chloride: 102 mmol/L (ref 98–107)
Co2: 16 mmol/L — ABNORMAL LOW (ref 21–32)
Creatinine: 8.87 mg/dL — ABNORMAL HIGH (ref 0.60–1.30)
EGFR (African American): 6 — ABNORMAL LOW
GFR CALC NON AF AMER: 5 — AB
Glucose: 138 mg/dL
Glucose: 138 mg/dL — ABNORMAL HIGH (ref 65–99)
Osmolality: 295 (ref 275–301)
Potassium: 3.7 mmol/L (ref 3.4–5.3)
Potassium: 3.7 mmol/L (ref 3.5–5.1)
Sodium: 135 mmol/L — AB (ref 137–147)
Sodium: 135 mmol/L — ABNORMAL LOW (ref 136–145)

## 2014-11-15 LAB — CBC
HCT: 31.6 % — ABNORMAL LOW (ref 35.0–47.0)
HGB: 10.2 g/dL — ABNORMAL LOW (ref 12.0–16.0)
MCH: 28.4 pg (ref 26.0–34.0)
MCHC: 32.4 g/dL (ref 32.0–36.0)
MCV: 88 fL (ref 80–100)
PLATELETS: 117 10*3/uL — AB (ref 150–440)
RBC: 3.61 10*6/uL — ABNORMAL LOW (ref 3.80–5.20)
RDW: 16.6 % — ABNORMAL HIGH (ref 11.5–14.5)
WBC: 7.7 10*3/uL (ref 3.6–11.0)

## 2014-11-15 LAB — CBC AND DIFFERENTIAL
HCT: 32 % — AB (ref 36–46)
Hemoglobin: 10.2 g/dL — AB (ref 12.0–16.0)
PLATELETS: 117 10*3/uL — AB (ref 150–399)
WBC: 7.7 10*3/mL

## 2014-11-15 LAB — TROPONIN I: TROPONIN-I: 0.07 ng/mL — AB

## 2014-11-17 LAB — CULTURE, BLOOD (SINGLE)

## 2014-11-22 ENCOUNTER — Telehealth: Payer: Self-pay | Admitting: Internal Medicine

## 2014-11-22 MED ORDER — FLUCONAZOLE 150 MG PO TABS: 150.0000 mg | ORAL_TABLET | Freq: Every day | ORAL | Status: DC

## 2014-11-22 NOTE — Telephone Encounter (Signed)
Patient was just [facility] last week [facility] last Tuesday has an apointment Monday for follow up. Patient is having stinging with urination and blood in urine with lower back pain. Patient asking to be treated for UTI.

## 2014-11-22 NOTE — Telephone Encounter (Signed)
The patient is needing medication called in to the pharmacy for a bladder infection . She can not take Levaquin.

## 2014-11-22 NOTE — Telephone Encounter (Signed)
I can treat for yeast infection but she will need  to bring in a urine for testing since she is on antibiotics currently .  rx sent

## 2014-11-22 NOTE — Telephone Encounter (Signed)
Patient notified and voiced understanding.

## 2014-11-24 ENCOUNTER — Telehealth: Payer: Self-pay

## 2014-11-24 ENCOUNTER — Ambulatory Visit: Payer: Self-pay | Admitting: Nephrology

## 2014-11-24 NOTE — Telephone Encounter (Signed)
Please advise. Patient still having symptoms where I can put her 1 with Dorothy Long

## 2014-11-24 NOTE — Telephone Encounter (Signed)
Patient was hurting so bad she decided to go to urgent care and is being treated for UTI.

## 2014-11-24 NOTE — Telephone Encounter (Signed)
The patient called and stated she is still feeling "bad".  She believes she has a bladder infection. She states she has a fever, chills, and pain in her stomach and back.  Do you want her worked in? If so, where?

## 2014-11-27 ENCOUNTER — Ambulatory Visit (INDEPENDENT_AMBULATORY_CARE_PROVIDER_SITE_OTHER): Payer: BC Managed Care – PPO | Admitting: Internal Medicine

## 2014-11-27 ENCOUNTER — Encounter: Payer: Self-pay | Admitting: Internal Medicine

## 2014-11-27 VITALS — BP 108/68 | HR 90 | Temp 97.5°F | Resp 16 | Ht 70.0 in | Wt 127.5 lb

## 2014-11-27 DIAGNOSIS — Z716 Tobacco abuse counseling: Secondary | ICD-10-CM

## 2014-11-27 DIAGNOSIS — R300 Dysuria: Secondary | ICD-10-CM

## 2014-11-27 DIAGNOSIS — Z09 Encounter for follow-up examination after completed treatment for conditions other than malignant neoplasm: Secondary | ICD-10-CM

## 2014-11-27 MED ORDER — FLUCONAZOLE 100 MG PO TABS: 100.0000 mg | ORAL_TABLET | Freq: Every day | ORAL | Status: DC

## 2014-11-27 MED ORDER — NICOTINE 7 MG/24HR TD PT24: 7.0000 mg | MEDICATED_PATCH | Freq: Every day | TRANSDERMAL | Status: DC

## 2014-11-27 NOTE — Progress Notes (Signed)
Pre-visit discussion using our clinic review tool. No additional management support is needed unless otherwise documented below in the visit note.  

## 2014-11-27 NOTE — Patient Instructions (Signed)
No more antibiotics until we have a urine culture to prove infection.  Bring yor urine specimen back tomorrow Fluconazole 100 mg (lower dose) daily for two days for  the vaginal discharge ,  Take with food  Suppress the cough with the Tussionex I gave you last time

## 2014-11-27 NOTE — Progress Notes (Signed)
Patient ID: Dorothy Long, female   DOB: 04/27/1961, 53 y.o.   MRN: [id number]    Patient Active Problem List   Diagnosis Date Noted  . Hospital discharge follow-up 11/28/2014  . Dysuria 11/28/2014  . Infection of urinary tract 11/07/2014  . Postmenopausal atrophic vaginitis 06/19/2014  . Allergic rhinitis 05/29/2014  . Bilateral leg edema 05/15/2014  . Screening for STD (sexually transmitted disease) 02/01/2014  . Screening for cervical cancer 02/01/2014  . Screening for breast cancer 02/01/2014  . Encounter for preventive health examination 02/01/2014  . Awaiting organ transplant 12/23/2013  . Hyperparathyroidism due to renal insufficiency 12/23/2013  . Excess fluid volume 09/27/2013  . Paroxysmal atrial fibrillation 09/25/2013  . GERD (gastroesophageal reflux disease) 08/12/2013  . Focal and segmental hyalinosis 08/08/2013  . Mitral stenosis with incompetence or regurgitation 07/19/2013  . Anemia in chronic kidney disease 07/19/2013  . End stage kidney disease 07/14/2013  . Tobacco abuse counseling 12/20/2012  . Vitamin D deficiency 07/22/2012  . Hyperlipidemia   . Hypertension 07/21/2012  . Family history of colon cancer 07/21/2012  . Tobacco abuse 07/21/2012    Subjective:  CC:   Chief Complaint  Patient presents with  . Follow-up    hospital  . Urinary Tract Infection    pr3esribed minocycline but could not tke caused nausea.    HPI:   Dorothy Long is a 53 y.o. female who presents for   Hospital follow up.  Patient was Was treated here for sinusitis with levaquin on nov 23, but stopped it after 3 doses  bc it made her legs go numb.  She was then admitted to [facility] on Dec 2 with COPD exacerbation for 3 days and received  cefuroxime and azithromycin, which she finished on Dec 8 .  Chest x ray was negative for infiltrate but noted minimal central bronchitic changes.  Her cough has persisted but is  productive of scant amounts of clear to white sputum.   2)  She developed dysuria started on Dec 9,  She called here requesting an antibiotic. Given her recent use of broad spectrum antibiotics , I prescribed fluconazole which made her nauseated, so she stopped taking it and went to Urgent Care.  She was prescribed minocycline despite having a documented doxycycline intolerance  but after 3 days stopped to due to nausea .  She now reports vaginal discharge and suprapubic pain .  Had a CT of abd and pelvis on Friday, which was ordered by nephrology for evaluation of bleeding from dialysis catheter.       3) tobacco abuse:  Not tolerating the nicoderm patch bc it is too strong and naking her nauseated.  Smokes 10 cigs /da   Past Medical History  Diagnosis Date  . Hypertension   . Hyperlipidemia   . COPD (chronic obstructive pulmonary disease)   . Anemia   . Mitral valve stenosis, severe   . CHF (congestive heart failure)   . Chronic kidney disease   . Acute on chronic renal failure   . Diastolic congestive heart failure   . A-fib     Past Surgical History  Procedure Laterality Date  . Abdominal hysterectomy    . Cholecystectomy    . Knee surgery Right   . Foot surgery Bilateral   . Av fistula placement         The following portions of the patient's history were reviewed and updated as appropriate: Allergies, current medications, and problem list.    Review of  Systems:   Patient denies headache, fevers, malaise, unintentional weight loss, skin rash, eye pain, sinus congestion and sinus pain, sore throat, dysphagia,  hemoptysis , cough, dyspnea, wheezing, chest pain, palpitations, orthopnea, edema, abdominal pain, nausea, melena, diarrhea, constipation, flank pain, dysuria, hematuria, urinary  Frequency, nocturia, numbness, tingling, seizures,  Focal weakness, Loss of consciousness,  Tremor, insomnia, depression, anxiety, and suicidal ideation.     History   Social History  . Marital Status: Married    Spouse Name: N/A    Number of  Children: N/A  . Years of Education: N/A   Occupational History  . Not on file.   Social History Main Topics  . Smoking status: Current Some Day Smoker -- 0.25 packs/day for 20 years    Types: Cigarettes  . Smokeless tobacco: Never Used  . Alcohol Use: No     Comment: occasional  . Drug Use: No  . Sexual Activity: No   Other Topics Concern  . Not on file   Social History Narrative    Objective:  Filed Vitals:   11/27/14 1714  BP: 108/68  Pulse: 90  Temp: 97.5 F (36.4 C)  Resp: 16     General appearance: alert, cooperative and appears stated age Ears: normal TM's and external ear canals both ears Throat: lips, mucosa, and tongue normal; teeth and gums normal Neck: no adenopathy, no carotid bruit, supple, symmetrical, trachea midline and thyroid not enlarged, symmetric, no tenderness/mass/nodules Back: symmetric, no curvature. ROM normal. No CVA tenderness. Lungs: clear to auscultation bilaterally Heart: regular rate and rhythm, S1, S2 normal, no murmur, click, rub or gallop Abdomen: soft, non-tender; bowel sounds normal; no masses,  no organomegaly Pulses: 2+ and symmetric Skin: Skin color, texture, turgor normal. No rashes or lesions Lymph nodes: Cervical, supraclavicular, and axillary nodes normal.  Assessment and Plan:  Hospital discharge follow-up Admitted with COPD exacerbation and discharged on Dec 8. No infiltrates on lung films from Dec 2 .  Today her  Lung exam is normal.  Cough is productive of clear sputum currently.  Continue cough suppressants, and bronchodilators  Tobacco abuse counseling Smoking cessation instruction/counseling given:  commended patient for quitting and reviewed strategies for preventing relapses.  Changing nicoderm patch to low dose for improved tolerance.   Dysuria Awaiting urine culture before treating with any more antibiotics. Resume loer dose antifungal for vaginal discharge presumed to be candida.      Updated  Medication List Outpatient Encounter Prescriptions as of 11/27/2014  Medication Sig  . acetaminophen (TYLENOL) 325 MG tablet Take 325 mg by mouth every 4 (four) hours as needed.   Marland Kitchen. albuterol (VENTOLIN HFA) 108 (90 BASE) MCG/ACT inhaler Inhale into the lungs every 6 (six) hours as needed for wheezing or shortness of breath.  . ALPRAZolam (XANAX) 0.25 MG tablet Take 0.25 mg by mouth 3 (three) times daily as needed for sleep.  . Calcium Acetate 667 MG TABS Take 3 capsules by mouth 3 (three) times daily.  . cinacalcet (SENSIPAR) 30 MG tablet Take 30 mg by mouth daily.  . diphenhydrAMINE (BENADRYL) 25 MG tablet Take 25 mg by mouth every other day. MON, WED, FRI BEFORE DIALYSIS  . Estradiol 10 MCG TABS vaginal tablet Inset 1 tablet every night for 2 weeks,  Then reduce use to twice weekly  . fluticasone (FLONASE) 50 MCG/ACT nasal spray Place 2 sprays into both nostrils daily.  . furosemide (LASIX) 40 MG tablet Take 1 tablet (40 mg total) by mouth 2 (two) times daily as  needed.  Marland Kitchen. HYDROcodone-acetaminophen (NORCO/VICODIN) 5-325 MG per tablet Take 1 tablet by mouth every 6 (six) hours as needed for moderate pain.  . Lactulose 20 GM/30ML SOLN Take 30 mLs (20 g total) by mouth every 6 (six) hours as needed. To relieve constipation  . metolazone (ZAROXOLYN) 5 MG tablet Take 1 tablet (5 mg total) by mouth daily as needed.  . metoprolol succinate (TOPROL-XL) 25 MG 24 hr tablet TAKE 1 TABLET (25 MG TOTAL) BY MOUTH DAILY.  . mometasone (ELOCON) 0.1 % cream Apply 1 application topically daily. To ear canal  . predniSONE (STERAPRED UNI-PAK) 10 MG tablet 6 tablets on Day 1 , then reduce by 1 tablet daily until gone  . tiotropium (SPIRIVA HANDIHALER) 18 MCG inhalation capsule Place 1 capsule (18 mcg total) into inhaler and inhale daily.  . [DISCONTINUED] fluconazole (DIFLUCAN) 150 MG tablet Take 1 tablet (150 mg total) by mouth daily.  . fluconazole (DIFLUCAN) 100 MG tablet Take 1 tablet (100 mg total) by mouth  daily.  . nicotine (NICODERM CQ) 7 mg/24hr patch Place 1 patch (7 mg total) onto the skin daily.  . [DISCONTINUED] levofloxacin (LEVAQUIN) 500 MG tablet Take 1 tablet (500 mg total) by mouth daily. (Patient not taking: Reported on 11/27/2014)     Orders Placed This Encounter  Procedures  . Urine Culture  . Urinalysis, dipstick only    No Follow-up on file.

## 2014-11-28 ENCOUNTER — Other Ambulatory Visit (INDEPENDENT_AMBULATORY_CARE_PROVIDER_SITE_OTHER): Payer: BC Managed Care – PPO | Admitting: *Deleted

## 2014-11-28 DIAGNOSIS — R300 Dysuria: Secondary | ICD-10-CM

## 2014-11-28 DIAGNOSIS — Z09 Encounter for follow-up examination after completed treatment for conditions other than malignant neoplasm: Secondary | ICD-10-CM | POA: Insufficient documentation

## 2014-11-28 DIAGNOSIS — Z139 Encounter for screening, unspecified: Secondary | ICD-10-CM

## 2014-11-28 LAB — URINALYSIS, ROUTINE W REFLEX MICROSCOPIC
BILIRUBIN URINE: NEGATIVE
Ketones, ur: NEGATIVE
LEUKOCYTES UA: NEGATIVE
NITRITE: NEGATIVE
Specific Gravity, Urine: 1.025 (ref 1.000–1.030)
Total Protein, Urine: >=300 — AB
UROBILINOGEN UA: 0.2 (ref 0.0–1.0)
Urine Glucose: NEGATIVE
pH: 6 (ref 5.0–8.0)

## 2014-11-28 NOTE — Assessment & Plan Note (Signed)
Smoking cessation instruction/counseling given:  commended patient for quitting and reviewed strategies for preventing relapses.  Changing nicoderm patch to low dose for improved tolerance.

## 2014-11-28 NOTE — Assessment & Plan Note (Addendum)
Awaiting urine culture before treating with any more antibiotics. Resume loer dose antifungal for vaginal discharge presumed to be candida.

## 2014-11-28 NOTE — Assessment & Plan Note (Signed)
Admitted with COPD exacerbation and discharged on Dec 8. No infiltrates on lung films from Dec 2 .  Today her  Lung exam is normal.  Cough is productive of clear sputum currently.  Continue cough suppressants, and bronchodilators

## 2014-11-29 ENCOUNTER — Encounter: Payer: Self-pay | Admitting: Internal Medicine

## 2014-12-02 LAB — URINE CULTURE

## 2014-12-27 ENCOUNTER — Emergency Department: Payer: Self-pay | Admitting: Emergency Medicine

## 2014-12-27 ENCOUNTER — Ambulatory Visit: Payer: BC Managed Care – PPO | Admitting: Internal Medicine

## 2015-01-10 ENCOUNTER — Ambulatory Visit (INDEPENDENT_AMBULATORY_CARE_PROVIDER_SITE_OTHER): Payer: BC Managed Care – PPO | Admitting: Internal Medicine

## 2015-01-10 ENCOUNTER — Encounter: Payer: Self-pay | Admitting: Internal Medicine

## 2015-01-10 VITALS — BP 110/56 | HR 73 | Temp 98.0°F | Resp 12 | Ht 70.0 in | Wt 121.0 lb

## 2015-01-10 DIAGNOSIS — R600 Localized edema: Secondary | ICD-10-CM

## 2015-01-10 DIAGNOSIS — J0110 Acute frontal sinusitis, unspecified: Secondary | ICD-10-CM

## 2015-01-10 MED ORDER — FUROSEMIDE 40 MG PO TABS: 40.0000 mg | ORAL_TABLET | Freq: Every day | ORAL | Status: DC

## 2015-01-10 NOTE — Progress Notes (Signed)
Pre visit review using our clinic review tool, if applicable. No additional management support is needed unless otherwise documented below in the visit note. 

## 2015-01-10 NOTE — Progress Notes (Signed)
Patient ID: Dorothy Long, female   DOB: [birth year], 54 y.o.   MRN: [id number]    Patient Active Problem List   Diagnosis Date Noted  . Sinusitis, acute frontal 01/11/2015  . Hospital discharge follow-up 11/28/2014  . Dysuria 11/28/2014  . Infection of urinary tract 11/07/2014  . Postmenopausal atrophic vaginitis 06/19/2014  . Allergic rhinitis 05/29/2014  . Bilateral leg edema 05/15/2014  . Screening for STD (sexually transmitted disease) 02/01/2014  . Screening for cervical cancer 02/01/2014  . Screening for breast cancer 02/01/2014  . Encounter for preventive health examination 02/01/2014  . Awaiting organ transplant 12/23/2013  . Hyperparathyroidism due to renal insufficiency 12/23/2013  . Excess fluid volume 09/27/2013  . Paroxysmal atrial fibrillation 09/25/2013  . GERD (gastroesophageal reflux disease) 08/12/2013  . Focal and segmental hyalinosis 08/08/2013  . Mitral stenosis with incompetence or regurgitation 07/19/2013  . Anemia in chronic kidney disease 07/19/2013  . End stage kidney disease 07/14/2013  . Tobacco abuse counseling 12/20/2012  . Vitamin D deficiency 07/22/2012  . Hyperlipidemia   . Hypertension 07/21/2012  . Family history of colon cancer 07/21/2012  . Tobacco abuse 07/21/2012    Subjective:  CC:   Chief Complaint  Patient presents with  . Sinusitis    seen in ED 2 weeks ago, still having sinus pressure, runny nose, sharp pain in right ear    HPI:   Dorothy Long is a 54 y.o. female who presents for  followup on recent treatment of sinus infection and gum infection  two weeks ago.  She was treated with augmentin for 7 days prescribed by ED and concurrent dental evaluaiton for pulpitis resulting in with tooth extraction and gum debridement .  She feels generally much better. No diarrhea from the antibiotics.  mouth no longer hurting and her gums have healed. . She has noted a drop in her urine output and has started retaining fluid in her  lower extremities     Past Medical History  Diagnosis Date  . Hypertension   . Hyperlipidemia   . COPD (chronic obstructive pulmonary disease)   . Anemia   . Mitral valve stenosis, severe   . CHF (congestive heart failure)   . Chronic kidney disease   . Acute on chronic renal failure   . Diastolic congestive heart failure   . A-fib     Past Surgical History  Procedure Laterality Date  . Abdominal hysterectomy    . Cholecystectomy    . Knee surgery Right   . Foot surgery Bilateral   . Av fistula placement         The following portions of the patient's history were reviewed and updated as appropriate: Allergies, current medications, and problem list.    Review of Systems:   Patient denies headache, fevers, malaise, unintentional weight loss, skin rash, eye pain, sinus congestion and sinus pain, sore throat, dysphagia,  hemoptysis , cough, dyspnea, wheezing, chest pain, palpitations, orthopnea, edema, abdominal pain, nausea, melena, diarrhea, constipation, flank pain, dysuria, hematuria, urinary  Frequency, nocturia, numbness, tingling, seizures,  Focal weakness, Loss of consciousness,  Tremor, insomnia, depression, anxiety, and suicidal ideation.     History   Social History  . Marital Status: Married    Spouse Name: N/A    Number of Children: N/A  . Years of Education: N/A   Occupational History  . Not on file.   Social History Main Topics  . Smoking status: Current Some Day Smoker -- 0.25 packs/day for 20 years  Types: Cigarettes  . Smokeless tobacco: Never Used  . Alcohol Use: No     Comment: occasional  . Drug Use: No  . Sexual Activity: No   Other Topics Concern  . Not on file   Social History Narrative    Objective:  Filed Vitals:   01/10/15 0802  BP: 110/56  Pulse: 73  Temp: 98 F (36.7 C)  Resp: 12     General appearance: alert, cooperative and appears stated age Ears: normal TM's and external ear canals both ears Throat: lips,  mucosa, and tongue normal; teeth and gums normal Neck: no adenopathy, no carotid bruit, supple, symmetrical, trachea midline and thyroid not enlarged, symmetric, no tenderness/mass/nodules Back: symmetric, no curvature. ROM normal. No CVA tenderness. Lungs: clear to auscultation bilaterally Heart: regular rate and rhythm, S1, S2 normal, no murmur, click, rub or gallop Abdomen: soft, non-tender; bowel sounds normal; no masses,  no organomegaly Pulses: 2+ and symmetric Skin: Skin color, texture, turgor normal. No rashes or lesions Lymph nodes: Cervical, supraclavicular, and axillary nodes normal.  Assessment and Plan:  Sinusitis, acute frontal Her symptoms have resolved.  I have recommended taking a probiotic for 3 weeks given her recent use of antibiotics to prevent C dificile colitis   Bilateral leg edema Recommended increasing furosemide to 40 mg daily.     Updated Medication List Outpatient Encounter Prescriptions as of 01/10/2015  Medication Sig  . acetaminophen (TYLENOL) 325 MG tablet Take 325 mg by mouth every 4 (four) hours as needed.   Marland Kitchen. albuterol (VENTOLIN HFA) 108 (90 BASE) MCG/ACT inhaler Inhale into the lungs every 6 (six) hours as needed for wheezing or shortness of breath.  . ALPRAZolam (XANAX) 0.25 MG tablet Take 0.25 mg by mouth 3 (three) times daily as needed for sleep.  . Calcium Acetate 667 MG TABS Take 3 capsules by mouth 3 (three) times daily.  . cinacalcet (SENSIPAR) 30 MG tablet Take 30 mg by mouth daily.  . diphenhydrAMINE (BENADRYL) 25 MG tablet Take 25 mg by mouth every other day. MON, WED, FRI BEFORE DIALYSIS  . fluticasone (FLONASE) 50 MCG/ACT nasal spray Place 2 sprays into both nostrils daily.  . furosemide (LASIX) 40 MG tablet Take 1 tablet (40 mg total) by mouth daily.  Marland Kitchen. HYDROcodone-acetaminophen (NORCO/VICODIN) 5-325 MG per tablet Take 1 tablet by mouth every 6 (six) hours as needed for moderate pain.  . metolazone (ZAROXOLYN) 5 MG tablet Take 1 tablet  (5 mg total) by mouth daily as needed.  . metoprolol succinate (TOPROL-XL) 25 MG 24 hr tablet TAKE 1 TABLET (25 MG TOTAL) BY MOUTH DAILY.  . mometasone (ELOCON) 0.1 % cream Apply 1 application topically daily. To ear canal  . tiotropium (SPIRIVA HANDIHALER) 18 MCG inhalation capsule Place 1 capsule (18 mcg total) into inhaler and inhale daily.  . [DISCONTINUED] furosemide (LASIX) 40 MG tablet Take 1 tablet (40 mg total) by mouth 2 (two) times daily as needed.  . [DISCONTINUED] Estradiol 10 MCG TABS vaginal tablet Inset 1 tablet every night for 2 weeks,  Then reduce use to twice weekly  . [DISCONTINUED] fluconazole (DIFLUCAN) 100 MG tablet Take 1 tablet (100 mg total) by mouth daily.  . [DISCONTINUED] Lactulose 20 GM/30ML SOLN Take 30 mLs (20 g total) by mouth every 6 (six) hours as needed. To relieve constipation  . [DISCONTINUED] nicotine (NICODERM CQ) 7 mg/24hr patch Place 1 patch (7 mg total) onto the skin daily.  . [DISCONTINUED] predniSONE (STERAPRED UNI-PAK) 10 MG tablet 6 tablets on Day  1 , then reduce by 1 tablet daily until gone     No orders of the defined types were placed in this encounter.    No Follow-up on file.

## 2015-01-10 NOTE — Patient Instructions (Addendum)
Resume 40 mg furosemide  once daily .  If it doesn't improve your urine output,  Le Dr. Jeanella CaraLatiffe know    Please take a probiotic ( Align, [place],  NordstromFlora jen  or [place]) for 2 weeks since you had the antibiotic to prevent a serious antibiotic associated diarrhea  Called" clostridium dificile colitis" ( should also help prevent   vaginal yeast infection)

## 2015-01-11 DIAGNOSIS — J0110 Acute frontal sinusitis, unspecified: Secondary | ICD-10-CM | POA: Insufficient documentation

## 2015-01-11 NOTE — Assessment & Plan Note (Signed)
Her symptoms have resolved.  I have recommended taking a probiotic for 3 weeks given her recent use of antibiotics to prevent C dificile colitis

## 2015-01-11 NOTE — Assessment & Plan Note (Signed)
Recommended increasing furosemide to 40 mg daily.

## 2015-01-16 ENCOUNTER — Telehealth: Payer: Self-pay | Admitting: Internal Medicine

## 2015-01-16 MED ORDER — FUROSEMIDE 40 MG PO TABS: 40.0000 mg | ORAL_TABLET | Freq: Every day | ORAL | Status: DC

## 2015-01-16 NOTE — Telephone Encounter (Signed)
Refill sent patient notified  

## 2015-01-16 NOTE — Telephone Encounter (Signed)
Pt needs refill on Lasix rx. Please advise pt.msn

## 2015-02-07 ENCOUNTER — Ambulatory Visit: Payer: BC Managed Care – PPO | Admitting: Cardiovascular Disease

## 2015-02-09 ENCOUNTER — Inpatient Hospital Stay: Payer: Self-pay | Admitting: Internal Medicine

## 2015-02-14 ENCOUNTER — Ambulatory Visit (INDEPENDENT_AMBULATORY_CARE_PROVIDER_SITE_OTHER): Payer: BC Managed Care – PPO | Admitting: Cardiovascular Disease

## 2015-02-14 ENCOUNTER — Other Ambulatory Visit: Payer: Self-pay | Admitting: Internal Medicine

## 2015-02-14 ENCOUNTER — Encounter: Payer: Self-pay | Admitting: Cardiovascular Disease

## 2015-02-14 VITALS — BP 130/86 | HR 79 | Ht 70.0 in | Wt 119.0 lb

## 2015-02-14 DIAGNOSIS — I10 Essential (primary) hypertension: Secondary | ICD-10-CM | POA: Diagnosis not present

## 2015-02-14 DIAGNOSIS — I052 Rheumatic mitral stenosis with insufficiency: Secondary | ICD-10-CM

## 2015-02-14 DIAGNOSIS — R0781 Pleurodynia: Secondary | ICD-10-CM

## 2015-02-14 DIAGNOSIS — I480 Paroxysmal atrial fibrillation: Secondary | ICD-10-CM

## 2015-02-14 DIAGNOSIS — N186 End stage renal disease: Secondary | ICD-10-CM | POA: Diagnosis not present

## 2015-02-14 MED ORDER — CYCLOBENZAPRINE HCL 10 MG PO TABS: 10.0000 mg | ORAL_TABLET | Freq: Three times a day (TID) | ORAL | Status: DC | PRN

## 2015-02-14 NOTE — Assessment & Plan Note (Signed)
She is doing well, feels stable. Any fluid retention would likely be improved with dialysis. No significant symptoms with day-to-day activities Recommended repeat echocardiogram later this year or early 2017 to look at her mitral valve

## 2015-02-14 NOTE — Assessment & Plan Note (Signed)
Blood pressure is well controlled on today's visit. No changes made to the medications. 

## 2015-02-14 NOTE — Assessment & Plan Note (Signed)
She reports that she is doing dialysis 3 days per week. Started classes for kidney transplant

## 2015-02-14 NOTE — Assessment & Plan Note (Signed)
Maintaining normal sinus rhythm. No medication changes made 

## 2015-02-14 NOTE — Patient Instructions (Signed)
You are doing well. No medication changes were made.  Please call us if you have new issues that need to be addressed before your next appt.  Your physician wants you to follow-up in: 6 months.  You will receive a reminder letter in the mail two months in advance. If you don't receive a letter, please call our office to schedule the follow-up appointment.   

## 2015-02-14 NOTE — Progress Notes (Signed)
Patient ID: Dorothy Long, female    DOB: [birth year], 54 y.o.   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 AV graft on the left.  She is now going to classes for kidney transplant, affiliation 3 [employer]  She denies any significant shortness of breath. No abdominal swelling  Her biggest complaint is recent coughing, congestion. She reported that she was in the hospital and treated for pneumonia   possibly from the coughing, she now has diffuse pain in her right shoulder, right ribs/flank, right scapula area worse with movement and palpation No significant improvement in her symptoms on Tylenol. She does have Vicodin at home but has not been taking these  EKG on today's visit shows normal sinus rhythm with rate 79 bpm, nonspecific ST abnormality   other past medical history She was seen in the hospital may 21st 2015 for atypical chest pain. Negative cardiac enzymes, repeat echocardiogram showing stable mild to moderate mitral valve stenosis and moderate regurgitation, rheumatic valve.   Previously on amlodipine which cause significant lower extremity edema   AV fistula upper extremity (11/07/13)  Hospital notes indicate severe anemia on October sixth 2014 Admission to the hospital 09/21/2013 for atrial fibrillation in the setting of diarrhea  [facility]  Low normal left ventricular systolic function, diastolic dysfunction, Biatrial enlargement Mild aortic regurgitation The mitral  leaflets are thickened and appear rheumaticically deformed. There is heavy mitral annular calcification. The there is mild mitral stenosis with a calculated valve area of 1.6 cm2 by pressure half-time equation and moderate, eccentric mitral regurgitation Mild to moderate pulmonary hypertension   Stress test 09/28/13 IMPRESSION:  1. No convincing evidence of pharmacologically induced myocardial ischemia.  2. Apparent anterior apical defect that is present only on attenuation corrected images is favored to reflect artifact due to overcorrection and may be worse on stress imaging secondary to greater patient motion. A small area of ischemia is felt less likely but is difficult to entirely exclude.  3. LVEF = 55%.  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 Wynelle LinkKolluru one year ago. Creatinine at that time was 1.8 In the hospital recently creatinine 5-6  Ultrasound in the hospital showed atrophic right kidney, very thin renal cortex, biopsy could not be done successfully She was started on steroids by Dr. Thedore MinsSingh plan to discharge home to finish her steroid course. She was given Neupogen while in the hospital It was recommended that she stop smoking.  Allergies  Allergen Reactions  . Ciprocinonide [Fluocinolone]   . Demerol [Meperidine]   . Diflucan [Fluconazole]   . Flagyl [Metronidazole]   . Levaquin [Levofloxacin In D5w] Other (See Comments)    Numbness in legs  . Morphine And Related   . Tetracyclines & Related   . Valium [Diazepam]     Outpatient Encounter Prescriptions as of 02/14/2015  Medication Sig  . acetaminophen (TYLENOL) 325 MG tablet Take 325 mg by mouth every 4 (four) hours as needed.   Marland Kitchen. albuterol (VENTOLIN  HFA) 108 (90 BASE) MCG/ACT inhaler Inhale into the lungs every 6 (six) hours as needed for wheezing or shortness of breath.  . ALPRAZolam (XANAX) 0.25 MG tablet Take 0.25 mg by mouth 3 (three) times daily as  needed for sleep.  . Calcium Acetate 667 MG TABS Take 3 capsules by mouth 3 (three) times daily.  . cinacalcet (SENSIPAR) 30 MG tablet Take 30 mg by mouth daily.  . cyclobenzaprine (FLEXERIL) 10 MG tablet Take 1 tablet (10 mg total) by mouth 3 (three) times daily as needed for muscle spasms.  . diphenhydrAMINE (BENADRYL) 25 MG tablet Take 25 mg by mouth every other day. MON, WED, FRI BEFORE DIALYSIS  . fluticasone (FLONASE) 50 MCG/ACT nasal spray Place 2 sprays into both nostrils daily.  . furosemide (LASIX) 40 MG tablet Take 1 tablet (40 mg total) by mouth daily.  Marland Kitchen HYDROcodone-acetaminophen (NORCO/VICODIN) 5-325 MG per tablet Take 1 tablet by mouth every 6 (six) hours as needed for moderate pain.  . metolazone (ZAROXOLYN) 5 MG tablet Take 1 tablet (5 mg total) by mouth daily as needed.  . metoprolol succinate (TOPROL-XL) 25 MG 24 hr tablet TAKE 1 TABLET (25 MG TOTAL) BY MOUTH DAILY.  . mometasone (ELOCON) 0.1 % cream Apply 1 application topically daily. To ear canal  . tiotropium (SPIRIVA HANDIHALER) 18 MCG inhalation capsule Place 1 capsule (18 mcg total) into inhaler and inhale daily.    Past Medical History  Diagnosis Date  . Hypertension   . Hyperlipidemia   . COPD (chronic obstructive pulmonary disease)   . Anemia   . Mitral valve stenosis, severe   . CHF (congestive heart failure)   . Chronic kidney disease   . Acute on chronic renal failure   . Diastolic congestive heart failure   . A-fib     Past Surgical History  Procedure Laterality Date  . Abdominal hysterectomy    . Cholecystectomy    . Knee surgery Right   . Foot surgery Bilateral   . Av fistula placement      Social History  reports that she has been smoking Cigarettes.  She has a 5 pack-year smoking history. She has never used smokeless tobacco. She reports that she does not drink alcohol or use illicit drugs.  Family History family history includes Cancer in her mother; Heart disease in her father;  Hypertension in her father.     Review of Systems  Constitutional: Negative.   HENT: Negative.   Eyes: Negative.   Respiratory: Negative.   Cardiovascular: Positive for leg swelling.       Minimal ankle swelling  Gastrointestinal: Negative.   Endocrine: Negative.   Musculoskeletal: Negative.   Skin: Negative.   Allergic/Immunologic: Negative.   Neurological: Negative.   Hematological: Negative.   Psychiatric/Behavioral: Negative.   All other systems reviewed and are negative.  BP 130/86 mmHg  Pulse 79  Ht  (1.778 m)  Wt 119 lb (53.978 kg)  BMI 17.07 kg/m2  Physical Exam  Constitutional: She is oriented to person, place, and time. She appears well-developed and well-nourished.  HENT:  Head: Normocephalic.  Nose: Nose normal.  Mouth/Throat: Oropharynx is clear and moist.  Eyes: Conjunctivae are normal. Pupils are equal, round, and reactive to light.  Neck: Normal range of motion. Neck supple. No JVD present.  Cardiovascular: Normal rate, regular rhythm, S1 normal, S2 normal and intact distal pulses.  Exam reveals no gallop and no friction rub.   Murmur heard.  Systolic murmur is present with  a grade of 3/6   Diastolic murmur is present  Pulmonary/Chest: Effort normal and breath sounds normal. No respiratory distress. She has no wheezes. She has no rales. She exhibits no tenderness.  Abdominal: Soft. Bowel sounds are normal. She exhibits no distension. There is no tenderness.  Musculoskeletal: Normal range of motion. She exhibits no edema or tenderness.  Lymphadenopathy:    She has no cervical adenopathy.  Neurological: She is alert and oriented to person, place, and time. Coordination normal.  Skin: Skin is warm and dry. No rash noted. No erythema.  Psychiatric: She has a normal mood and affect. Her behavior is normal. Judgment and thought content normal.    Assessment and Plan  Nursing note and vitals reviewed.

## 2015-02-19 ENCOUNTER — Encounter: Payer: Self-pay | Admitting: Internal Medicine

## 2015-02-19 ENCOUNTER — Ambulatory Visit (INDEPENDENT_AMBULATORY_CARE_PROVIDER_SITE_OTHER): Payer: BC Managed Care – PPO | Admitting: Internal Medicine

## 2015-02-19 VITALS — BP 114/64 | HR 73 | Temp 97.5°F | Resp 14 | Ht 70.0 in | Wt 125.5 lb

## 2015-02-19 DIAGNOSIS — R9389 Abnormal findings on diagnostic imaging of other specified body structures: Secondary | ICD-10-CM

## 2015-02-19 DIAGNOSIS — R938 Abnormal findings on diagnostic imaging of other specified body structures: Secondary | ICD-10-CM

## 2015-02-19 DIAGNOSIS — Z87891 Personal history of nicotine dependence: Secondary | ICD-10-CM

## 2015-02-19 DIAGNOSIS — Z09 Encounter for follow-up examination after completed treatment for conditions other than malignant neoplasm: Secondary | ICD-10-CM

## 2015-02-19 DIAGNOSIS — J441 Chronic obstructive pulmonary disease with (acute) exacerbation: Secondary | ICD-10-CM

## 2015-02-19 DIAGNOSIS — J0111 Acute recurrent frontal sinusitis: Secondary | ICD-10-CM

## 2015-02-19 MED ORDER — AMOXICILLIN-POT CLAVULANATE 875-125 MG PO TABS: 1.0000 | ORAL_TABLET | Freq: Two times a day (BID) | ORAL | Status: DC

## 2015-02-19 NOTE — Patient Instructions (Addendum)
I am treating you for bacterial sinusitis  With an antibiotic (augmentin)  To manage the infection  I also advise use of the following OTC meds to help with your other symptoms.   Take generic OTC benadryl 25 mg every 8 hours if needed for runny nose,  Sudafed if needed for congestion,   flush your sinuses twice daily with NeilMed sinus rinse  (do over the sink because if you do it right you will spit out globs of mucus)  Use benzonatate capsules   FOR THE DAYTIME COUGH.  Use the tussionex liquid cough syrup at night fOr severe cough or headache.  It has vicodin in it so DO NOT COMBINE WITH ALCOHOL OR SHARE  WITH OTHERS  Please take a probiotic ( Align, Floraque or Culturelle) OR A GENERIC EQUIVALENT for three weeks since you are taking an  antibiotic to prevent a very serious antibiotic associated infection  Called closttidium dificile colitis that can cause diarrhea, multi organ failure, sepsis and death if not managed.

## 2015-02-19 NOTE — Progress Notes (Signed)
Pre-visit discussion using our clinic review tool. No additional management support is needed unless otherwise documented below in the visit note.  

## 2015-02-19 NOTE — Progress Notes (Signed)
Patient ID: Dorothy Long, female   DOB: [birth year], 54 y.o.   MRN: [id number]  Patient Active Problem List   Diagnosis Date Noted  . COPD exacerbation 02/21/2015  . Sinusitis, acute frontal 01/11/2015  . Hospital discharge follow-up 11/28/2014  . Dysuria 11/28/2014  . Infection of urinary tract 11/07/2014  . Postmenopausal atrophic vaginitis 06/19/2014  . Allergic rhinitis 05/29/2014  . Bilateral leg edema 05/15/2014  . Screening for STD (sexually transmitted disease) 02/01/2014  . Screening for cervical cancer 02/01/2014  . Screening for breast cancer 02/01/2014  . Encounter for preventive health examination 02/01/2014  . Awaiting organ transplant 12/23/2013  . Hyperparathyroidism due to renal insufficiency 12/23/2013  . Excess fluid volume 09/27/2013  . Paroxysmal atrial fibrillation 09/25/2013  . GERD (gastroesophageal reflux disease) 08/12/2013  . Focal and segmental hyalinosis 08/08/2013  . Mitral stenosis with incompetence or regurgitation 07/19/2013  . Anemia in chronic kidney disease 07/19/2013  . End stage kidney disease 07/14/2013  . Tobacco abuse counseling 12/20/2012  . Vitamin D deficiency 07/22/2012  . Hyperlipidemia   . Hypertension 07/21/2012  . Family history of colon cancer 07/21/2012  . History of tobacco abuse 07/21/2012    Subjective:  CC:   Chief Complaint  Patient presents with  . Follow-up    Hospital folow up  . Sinusitis    Patient c/o mucus tinged with  blood.    HPI:   Dorothy Long is a 54 y.o. female who presents for  Hospital follow up arranged by patient last week when she contacted me during husband's visit.  Patient was treated on feb 26th and released on feb 27 [facility]on [facility] for COPD exacerbation with inhaled nebs, IV abx .  Blood cultures were negative,  Chest x ray   suggested a right lower lobe PNA with pleural effusion, andcardiomegaly with marked 4 chamber enlargement was noted on CT.  She was treated with azithromycin and  prednisone taper.  She reports having frequent loose stools  despite taking a probiotic s per my advice.    (was not by hospital to do so) . Bowels have been normal for the past week     The prednisone made her very agitated and restless and aggravated her insomnia.    3 days ago started having sinus congestion reporting pain behind right  Eye,  And purulent nasal drainage that has been green and blood streaked  ,       Has not been smoking ,  Appetite has been excellent    Past Medical History  Diagnosis Date  . Hypertension   . Hyperlipidemia   . COPD (chronic obstructive pulmonary disease)   . Anemia   . Mitral valve stenosis, severe   . CHF (congestive heart failure)   . Chronic kidney disease   . Acute on chronic renal failure   . Diastolic congestive heart failure   . A-fib     Past Surgical History  Procedure Laterality Date  . Abdominal hysterectomy    . Cholecystectomy    . Knee surgery Right   . Foot surgery Bilateral   . Av fistula placement         The following portions of the patient's history were reviewed and updated as appropriate: Allergies, current medications, and problem list.    Review of Systems:   Patient denies headache, fevers, malaise, unintentional weight loss, skin rash, eye pain, sinus congestion and sinus pain, sore throat, dysphagia,  hemoptysis , cough, dyspnea, wheezing, chest pain, palpitations,  orthopnea, edema, abdominal pain, nausea, melena, diarrhea, constipation, flank pain, dysuria, hematuria, urinary  Frequency, nocturia, numbness, tingling, seizures,  Focal weakness, Loss of consciousness,  Tremor, insomnia, depression, anxiety, and suicidal ideation.     History   Social History  . Marital Status: Married    Spouse Name: N/A  . Number of Children: N/A  . Years of Education: N/A   Occupational History  . Not on file.   Social History Main Topics  . Smoking status: Current Some Day Smoker -- 0.25 packs/day for 20  years    Types: Cigarettes  . Smokeless tobacco: Never Used  . Alcohol Use: No     Comment: occasional  . Drug Use: No  . Sexual Activity: No   Other Topics Concern  . Not on file   Social History Narrative    Objective:  Filed Vitals:   02/19/15 0920  BP: 114/64  Pulse: 73  Temp: 97.5 F (36.4 C)  Resp: 14     General appearance: alert, cooperative and appears stated age Ears: normal TM's and external ear canals both ears Face:  right frontal sinus pain, tender to palpation  Throat: lips, mucosa, and tongue normal; teeth and gums normal Neck: no adenopathy, no carotid bruit, supple, symmetrical, trachea midline and thyroid not enlarged, symmetric, no tenderness/mass/nodules Back: symmetric, no curvature. ROM normal. No CVA tenderness. Lungs: clear to auscultation bilaterally Heart: regular rate and rhythm, S1, S2 normal, no murmur, click, rub or gallop Abdomen: soft, non-tender; bowel sounds normal; no masses,  no organomegaly Pulses: 2+ and symmetric Skin: Skin color, texture, turgor normal. No rashes or lesions Lymph nodes: Cervical, supraclavicular, and axillary nodes normal.  Assessment and Plan:  History of tobacco abuse She has been tobacco free for over 5 months    Hospital discharge follow-up Lung exam is clear from 2/26 admission for COPD exacerbation,  But she will need a follow up CXR in 6 weeks to ensure clearing of tight basilar opacity   Sinusitis, acute frontal Given chronicity of symptoms, development of facial pain and exam consistent with bacterial URI,  Will treat with empiric antibiotics, decongestants, and saline lavage.  .  I have recommended taking a probiotic for 3 weeks given her recent use of antibiotics to prevent C dificile colitis     COPD exacerbation Secondary to RLL PNA ,  By CXR  during recent admission.  symptoms have resolved.  Repeat chest x ray in mid April to ensure resolution of right basilar opacity and effusion .      Updated Medication List Outpatient Encounter Prescriptions as of 02/19/2015  Medication Sig  . acetaminophen (TYLENOL) 325 MG tablet Take 325 mg by mouth every 4 (four) hours as needed.   Marland Kitchen. albuterol (VENTOLIN HFA) 108 (90 BASE) MCG/ACT inhaler Inhale into the lungs every 6 (six) hours as needed for wheezing or shortness of breath.  . ALPRAZolam (XANAX) 0.25 MG tablet Take 0.25 mg by mouth 3 (three) times daily as needed for sleep.  . Calcium Acetate 667 MG TABS Take 3 capsules by mouth 3 (three) times daily.  . cinacalcet (SENSIPAR) 30 MG tablet Take 30 mg by mouth daily.  . cyclobenzaprine (FLEXERIL) 10 MG tablet Take 1 tablet (10 mg total) by mouth 3 (three) times daily as needed for muscle spasms.  . diphenhydrAMINE (BENADRYL) 25 MG tablet Take 25 mg by mouth every other day. MON, WED, FRI BEFORE DIALYSIS  . fluticasone (FLONASE) 50 MCG/ACT nasal spray Place 2 sprays into both  nostrils daily.  . furosemide (LASIX) 40 MG tablet Take 1 tablet (40 mg total) by mouth daily.  Marland Kitchen HYDROcodone-acetaminophen (NORCO/VICODIN) 5-325 MG per tablet Take 1 tablet by mouth every 6 (six) hours as needed for moderate pain.  . metolazone (ZAROXOLYN) 5 MG tablet Take 1 tablet (5 mg total) by mouth daily as needed.  . metoprolol succinate (TOPROL-XL) 25 MG 24 hr tablet TAKE 1 TABLET (25 MG TOTAL) BY MOUTH DAILY.  . mometasone (ELOCON) 0.1 % cream Apply 1 application topically daily. To ear canal  . tiotropium (SPIRIVA HANDIHALER) 18 MCG inhalation capsule Place 1 capsule (18 mcg total) into inhaler and inhale daily.  Marland Kitchen amoxicillin-clavulanate (AUGMENTIN) 875-125 MG per tablet Take 1 tablet by mouth 2 (two) times daily.     Orders Placed This Encounter  Procedures  . DG Chest 2 View    No Follow-up on file.

## 2015-02-21 ENCOUNTER — Encounter: Payer: Self-pay | Admitting: Internal Medicine

## 2015-02-21 DIAGNOSIS — J441 Chronic obstructive pulmonary disease with (acute) exacerbation: Secondary | ICD-10-CM | POA: Insufficient documentation

## 2015-02-21 NOTE — Assessment & Plan Note (Signed)
Given chronicity of symptoms, development of facial pain and exam consistent with bacterial URI,  Will treat with empiric antibiotics, decongestants, and saline lavage.  .  I have recommended taking a probiotic for 3 weeks given her recent use of antibiotics to prevent C dificile colitis

## 2015-02-21 NOTE — Assessment & Plan Note (Signed)
She has been tobacco free for over 5 months

## 2015-02-21 NOTE — Assessment & Plan Note (Addendum)
Secondary to RLL PNA ,  By CXR  during recent admission.  symptoms have resolved.  Repeat chest x ray in mid April to ensure resolution of right basilar opacity and effusion .

## 2015-02-21 NOTE — Assessment & Plan Note (Signed)
Lung exam is clear from 2/26 admission for COPD exacerbation,  But she will need a follow up CXR in 6 weeks to ensure clearing of tight basilar opacity

## 2015-02-23 ENCOUNTER — Telehealth: Payer: Self-pay | Admitting: Internal Medicine

## 2015-02-23 NOTE — Telephone Encounter (Signed)
Patient needs follow up X-ray for recheck on pneumonia from Hospital follow up. Talked with patient and mailed order for X-ray.

## 2015-03-05 ENCOUNTER — Other Ambulatory Visit: Payer: Self-pay | Admitting: Internal Medicine

## 2015-03-12 ENCOUNTER — Ambulatory Visit (INDEPENDENT_AMBULATORY_CARE_PROVIDER_SITE_OTHER): Payer: BC Managed Care – PPO | Admitting: Internal Medicine

## 2015-03-12 ENCOUNTER — Encounter: Payer: Self-pay | Admitting: Internal Medicine

## 2015-03-12 VITALS — BP 104/70 | HR 73 | Temp 98.3°F | Resp 16 | Ht 70.0 in | Wt 122.5 lb

## 2015-03-12 DIAGNOSIS — N760 Acute vaginitis: Secondary | ICD-10-CM | POA: Diagnosis not present

## 2015-03-12 DIAGNOSIS — R300 Dysuria: Secondary | ICD-10-CM

## 2015-03-12 MED ORDER — FLUCONAZOLE 150 MG PO TABS: 150.0000 mg | ORAL_TABLET | Freq: Every day | ORAL | Status: DC

## 2015-03-12 MED ORDER — FLUTICASONE PROPIONATE 50 MCG/ACT NA SUSP: 2.0000 | Freq: Every day | NASAL | Status: DC

## 2015-03-12 NOTE — Progress Notes (Signed)
Patient ID: Dorothy Long, female Dorothy Garret  DOB: [birth year], 54 y.o.   MRN: [id number]  Patient Active Problem List   Diagnosis Date Noted  . Vaginitis and vulvovaginitis 03/14/2015  . Hospital discharge follow-up 11/28/2014  . Dysuria 11/28/2014  . Infection of urinary tract 11/07/2014  . Postmenopausal atrophic vaginitis 06/19/2014  . Allergic rhinitis 05/29/2014  . Bilateral leg edema 05/15/2014  . Screening for STD (sexually transmitted disease) 02/01/2014  . Screening for cervical cancer 02/01/2014  . Screening for breast cancer 02/01/2014  . Encounter for preventive health examination 02/01/2014  . Awaiting organ transplant 12/23/2013  . Hyperparathyroidism due to renal insufficiency 12/23/2013  . Excess fluid volume 09/27/2013  . Paroxysmal atrial fibrillation 09/25/2013  . GERD (gastroesophageal reflux disease) 08/12/2013  . Focal and segmental hyalinosis 08/08/2013  . Mitral stenosis with incompetence or regurgitation 07/19/2013  . Anemia in chronic kidney disease 07/19/2013  . End stage kidney disease 07/14/2013  . Tobacco abuse counseling 12/20/2012  . Vitamin D deficiency 07/22/2012  . Hyperlipidemia   . Hypertension 07/21/2012  . Family history of colon cancer 07/21/2012  . History of tobacco abuse 07/21/2012    Subjective:  CC:   Chief Complaint  Patient presents with  . Vaginitis    X 1 week  tried monostat no relief    HPI:   Dorothy Long is a 54 y.o. female who presents for  Itching ,  vaginal burning ,  Discharge for two weeks,  Was treated for sinusitis several weeks ago,  Did nont take a probiotic.  Has tried monistat. No improvement in symptoms    Past Medical History  Diagnosis Date  . Hypertension   . Hyperlipidemia   . COPD (chronic obstructive pulmonary disease)   . Anemia   . Mitral valve stenosis, severe   . CHF (congestive heart failure)   . Chronic kidney disease   . Acute on chronic renal failure   . Diastolic congestive heart  failure   . A-fib     Past Surgical History  Procedure Laterality Date  . Abdominal hysterectomy    . Cholecystectomy    . Knee surgery Right   . Foot surgery Bilateral   . Av fistula placement         The following portions of the patient's history were reviewed and updated as appropriate: Allergies, current medications, and problem list.    Review of Systems:   Patient denies headache, fevers, malaise, unintentional weight loss, skin rash, eye pain, sinus congestion and sinus pain, sore throat, dysphagia,  hemoptysis , cough, dyspnea, wheezing, chest pain, palpitations, orthopnea, edema, abdominal pain, nausea, melena, diarrhea, constipation, flank pain, dysuria, hematuria, urinary  Frequency, nocturia, numbness, tingling, seizures,  Focal weakness, Loss of consciousness,  Tremor, insomnia, depression, anxiety, and suicidal ideation.     History   Social History  . Marital Status: Married    Spouse Name: N/A  . Number of Children: N/A  . Years of Education: N/A   Occupational History  . Not on file.   Social History Main Topics  . Smoking status: Current Some Day Smoker -- 0.25 packs/day for 20 years    Types: Cigarettes  . Smokeless tobacco: Never Used  . Alcohol Use: No     Comment: occasional  . Drug Use: No  . Sexual Activity: No   Other Topics Concern  . Not on file   Social History Narrative    Objective: BP 104/70 mmHg  Pulse 73  Temp(Src) 98.3  F (36.8 C) (Oral)  Resp 16  Ht  (1.778 m)  Wt 122 lb 8 oz (55.566 kg)  BMI 17.58 kg/m2  SpO2 98%  General Appearance:    Alert, cooperative, no distress, appears stated age  Back:     Symmetric, no curvature, ROM normal, no CVA tenderness  Lungs:     Clear to auscultation bilaterally, respirations unlabored  Chest Wall:    No tenderness or deformity  Breast Exam:    No tenderness, masses, or nipple abnormality  Abdomen:     Soft, non-tender, bowel sounds active all four quadrants,    no  masses, no organomegaly  Genitalia:    Pelvic: cervix normal in appearance, external genitalia normal, no adnexal masses or tenderness, no cervical motion tenderness, rectovaginal septum normal, uterus normal size, shape, and consistency and vagina normal without discharge  Extremities:   Extremities normal, atraumatic, no cyanosis or edema  Pulses:   2+ and symmetric all extremities  Skin:   Skin color, texture, turgor normal, no rashes or lesions  Lymph nodes:   Cervical, supraclavicular, and axillary nodes normal  Neurologic:   CNII-XII intact, normal strength, sensation and reflexes    throughout    Assessment and Plan:  Vaginitis and vulvovaginitis Pelvic exam normal,  Except for atrophic changes.  And UA was normal Empiric treatment with fluconazole x 2 days,  But suspect atrophic vaginitis      Updated Medication List Outpatient Encounter Prescriptions as of 03/12/2015  Medication Sig  . acetaminophen (TYLENOL) 325 MG tablet Take 325 mg by mouth every 4 (four) hours as needed.   Marland Kitchen albuterol (VENTOLIN HFA) 108 (90 BASE) MCG/ACT inhaler Inhale into the lungs every 6 (six) hours as needed for wheezing or shortness of breath.  . ALPRAZolam (XANAX) 0.25 MG tablet Take 0.25 mg by mouth 3 (three) times daily as needed for sleep.  Marland Kitchen amoxicillin-clavulanate (AUGMENTIN) 875-125 MG per tablet Take 1 tablet by mouth 2 (two) times daily.  . Calcium Acetate 667 MG TABS Take 3 capsules by mouth 3 (three) times daily.  . cinacalcet (SENSIPAR) 30 MG tablet Take 30 mg by mouth daily.  . cyclobenzaprine (FLEXERIL) 10 MG tablet Take 1 tablet (10 mg total) by mouth 3 (three) times daily as needed for muscle spasms.  . diphenhydrAMINE (BENADRYL) 25 MG tablet Take 25 mg by mouth every other day. MON, WED, FRI BEFORE DIALYSIS  . fluticasone (FLONASE) 50 MCG/ACT nasal spray Place 2 sprays into both nostrils daily.  . furosemide (LASIX) 40 MG tablet Take 1 tablet (40 mg total) by mouth daily.  Marland Kitchen  HYDROcodone-acetaminophen (NORCO/VICODIN) 5-325 MG per tablet Take 1 tablet by mouth every 6 (six) hours as needed for moderate pain.  . metolazone (ZAROXOLYN) 5 MG tablet Take 1 tablet (5 mg total) by mouth daily as needed.  . metoprolol succinate (TOPROL-XL) 25 MG 24 hr tablet TAKE 1 TABLET (25 MG TOTAL) BY MOUTH DAILY.  . mometasone (ELOCON) 0.1 % cream Apply 1 application topically daily. To ear canal  . tiotropium (SPIRIVA HANDIHALER) 18 MCG inhalation capsule Place 1 capsule (18 mcg total) into inhaler and inhale daily.  . [DISCONTINUED] fluticasone (FLONASE) 50 MCG/ACT nasal spray Place 2 sprays into both nostrils daily.  . fluconazole (DIFLUCAN) 150 MG tablet Take 1 tablet (150 mg total) by mouth daily.     Orders Placed This Encounter  Procedures  . Urine Culture  . Urinalysis, Routine w reflex microscopic  . POCT Urinalysis Dipstick  No Follow-up on file.

## 2015-03-12 NOTE — Progress Notes (Signed)
Pre-visit discussion using our clinic review tool. No additional management support is needed unless otherwise documented below in the visit note.  

## 2015-03-12 NOTE — Patient Instructions (Signed)
I am treating your for a yeast infection,  Based on your itching,  You have no signs of it on today's exam.  If the itching dose not improve in 2 days,  Call for "Plan B"  Which will include a potent estrogen cream to use nightly  For two weeks, followed by twice week therafter  For the itching.       Atrophic Vaginitis Atrophic vaginitis is a problem of low levels of estrogen in women. This problem can happen at any age. It is most common in women who have gone through menopause ("the change").  HOW WILL I KNOW IF I HAVE THIS PROBLEM? You may have:  Trouble with peeing (urinating), such as:  Going to the bathroom often.  A hard time holding your pee until you reach a bathroom.  Leaking pee.  Having pain when you pee.  Itching or a burning feeling.  Vaginal bleeding and spotting.  Pain during sex.  Dryness of the vagina.  A yellow, bad-smelling fluid (discharge) coming from the vagina. HOW WILL MY DOCTOR CHECK FOR THIS PROBLEM?  During your exam, your doctor will likely find the problem.  If there is a vaginal fluid, it may be checked for infection. HOW WILL THIS PROBLEM BE TREATED? Keep the vulvar skin as clean as possible. Moisturizers and lubricants can help with some of the symptoms. Estrogen replacement can help. There are 2 ways to take estrogen:  Systemic estrogen gets estrogen to your whole body. It takes many weeks or months before the symptoms get better.  You take an estrogen pill.  You use a skin patch. This is a patch that you put on your skin.  If you still have your uterus, your doctor may ask you to take a hormone. Talk to your doctor about the right medicine for you.  Estrogen cream.  This puts estrogen only at the part of your body where you apply it. The cream is put into the vagina or put on the vulvar skin. For some women, estrogen cream works faster than pills or the patch. CAN ALL WOMEN WITH THIS PROBLEM USE ESTROGEN? No. Women with certain  types of cancer, liver problems, or problems with blood clots should not take estrogen. Your doctor can help you decide the best treatment for your symptoms. Document Released: 05/19/2008 Document Revised: 12/06/2013 Document Reviewed: 05/19/2008 [facility] Patient Information 2015 [employer], [place]. This information is not intended to replace advice given to you by your health care provider. Make sure you discuss any questions you have with your health care provider.

## 2015-03-14 DIAGNOSIS — N760 Acute vaginitis: Secondary | ICD-10-CM | POA: Insufficient documentation

## 2015-03-14 NOTE — Assessment & Plan Note (Addendum)
Pelvic exam normal,  Except for atrophic changes.  And UA was normal Empiric treatment with fluconazole x 2 days,  But suspect atrophic vaginitis

## 2015-04-06 NOTE — Consult Note (Signed)
General Aspect 54 year old [place] American female with a past medical history of COPD, hypertension presenting  with severe shortness of breath. Cardiology was consulted for SOB, pulmonary edema.  The patient reports cough for the last week, malaise. She woke up in the middle of the night with severe shortness of breah and called EMS. She feels she has "asthma epsiodes".  in the Emergency Department, the patient has noted to have renal insufficiency, bilaterally diffuse wheeze,  She received multiple breathing treatments and Solu-Medrol with some improvement. The patient was also found to be tachycardic and hypertensive with a blood pressure of 191/110, with a heart rate of 115.  ECHO showing severe morphine sulfate and MR, mildly depressed EF, at least mildly elevated RVSP   Present Illness . SOCIAL HISTORY: Continues to smoke 1 pack a day. Denies drinking alcohol or using illicit drugs. Married, lives with her husband. Works as a Education officer, [employer].  FAMILY HISTORY: History of hypertension, heart problems.   Physical Exam:  GEN well developed, well nourished, no acute distress, thin   HEENT hearing intact to voice, moist oral mucosa   NECK supple   RESP postive use of accessory muscles  rhonchi   CARD Regular rate and rhythm  Tachycardic  Murmur   Murmur Systolic  Diastolic   Systolic Murmur Out flow   Diastolic Murmur Decrescendo   ABD denies tenderness  normal BS   LYMPH negative neck   EXTR negative edema   SKIN normal to palpation   NEURO motor/sensory function intact   PSYCH alert, A+O to time, place, person, good insight   Review of Systems:  Subjective/Chief Complaint Malaise, SOB   General: Fatigue  tired   Skin: No Complaints   ENT: No Complaints   Eyes: No Complaints   Neck: No Complaints   Respiratory: Frequent cough  Short of breath   Cardiovascular: Dyspnea   Gastrointestinal: No Complaints   Genitourinary: No Complaints   Vascular: No  Complaints   Musculoskeletal: No Complaints   Neurologic: No Complaints   Hematologic: No Complaints   Endocrine: No Complaints   Psychiatric: No Complaints   Review of Systems: All other systems were reviewed and found to be negative   Medications/Allergies Reviewed Medications/Allergies reviewed     COPD:    htn:        Admit Diagnosis:   OBSTRUCTIVE CHRONIC BRONCHITIS WITH: Onset Date: 07-Jul-2013, Status: Active, Description: OBSTRUCTIVE CHRONIC BRONCHITIS WITH   EXACERBATION: Onset Date: 07-Jul-2013, Status: Active, Description: EXACERBATION  Home Medications: Medication Instructions Status  amLODIPine 5 mg oral tablet 1 tab(s) orally once a day Active   Lab Results:  Hepatic:  24-Jul-14 01:56   Bilirubin, Total 0.3  Alkaline Phosphatase 123  SGPT (ALT) 17  SGOT (AST)  43  Total Protein, Serum 7.2  Albumin, Serum  3.1  Routine Chem:  24-Jul-14 01:56   Glucose, Serum 96  BUN  53  Creatinine (comp)  5.45  Sodium, Serum 137  Potassium, Serum 3.7  Chloride, Serum  109  CO2, Serum  15  Calcium (Total), Serum  8.0  Osmolality (calc) 288  eGFR (African American)  10  eGFR (Non-African American)  8 (eGFR values <53m/min/1.73 m2 may be an indication of chronic kidney disease (CKD). Calculated eGFR is useful in patients with stable renal function. The eGFR calculation will not be reliable in acutely ill patients when serum creatinine is changing rapidly. It is not useful in  patients on dialysis. The eGFR calculation may not be applicable  to patients at the low and high extremes of body sizes, pregnant women, and vegetarians.)  Anion Gap 13  Cardiac:  24-Jul-14 01:56   CK, Total 206  CPK-MB, Serum 1.7 (Result(s) reported on 07 Jul 2013 at 02:25AM.)  Troponin I 0.04 (0.00-0.05 0.05 ng/mL or less: NEGATIVE  Repeat testing in 3-6 hrs  if clinically indicated. >0.05 ng/mL: POTENTIAL  MYOCARDIAL INJURY. Repeat  testing in 3-6 hrs if  clinically  indicated. NOTE: An increase or decrease  of 30% or more on serial  testing suggests a  clinically important change)    09:39   Troponin I 0.04 (0.00-0.05 0.05 ng/mL or less: NEGATIVE  Repeat testing in 3-6 hrs  if clinically indicated. >0.05 ng/mL: POTENTIAL  MYOCARDIAL INJURY. Repeat  testing in 3-6 hrs if  clinically indicated. NOTE: An increase or decrease  of 30% or more on serial  testing suggests a  clinically important change)  Routine UA:  24-Jul-14 01:56   Color (UA) Straw  Clarity (UA) Clear  Glucose (UA) 50 mg/dL  Bilirubin (UA) Negative  Ketones (UA) Negative  Specific Gravity (UA) 1.005  Blood (UA) 1+  pH (UA) 5.0  Protein (UA) >=500  Nitrite (UA) Negative  Leukocyte Esterase (UA) Negative (Result(s) reported on 07 Jul 2013 at 07:22AM.)  RBC (UA) 1 /HPF  WBC (UA) 2 /HPF  Bacteria (UA) TRACE  Epithelial Cells (UA) <1 /HPF (Result(s) reported on 07 Jul 2013 at 07:22AM.)  Routine Hem:  24-Jul-14 01:56   WBC (CBC) 5.6  RBC (CBC)  3.12  Hemoglobin (CBC)  9.2  Hematocrit (CBC)  27.3  Platelet Count (CBC) 166 (Result(s) reported on 07 Jul 2013 at 02:35AM.)  MCV 87  MCH 29.5  MCHC 33.7  RDW 13.7   EKG:  Interpretation EKG shows sinus tach with rate 111, no significant ST or T wave changes   Radiology Results: XRay:    24-Jul-14 02:04, Chest Portable Single View  Chest Portable Single View   REASON FOR EXAM:    dyspnea  COMMENTS:       PROCEDURE: DXR - DXR PORTABLE CHEST SINGLE VIEW  - Jul 07 2013  2:04AM     RESULT:     Findings: There is prominence of the interstitial markings. No focal   regions of consolidation are identified. The cardiac silhouette is   moderately enlarged. The visualized bony skeleton is unremarkable.    IMPRESSION:    1. Interstitial infiltrate may represent a component of pulmonary edema.   An underlying component of pulmonary fibrosis is also of diagnostic     consideration. No focal regions of consolidation are  appreciated.    Thank you for the opportunity to contribute to the care of your patient.         Verified By: Mikki Santee, M.D., MD  Cardiology:    24-Jul-14 09:55, Echo Doppler  Echo Doppler   REASON FOR EXAM:      COMMENTS:       PROCEDURE: [facility] - ECHO DOPPLER COMPLETE(TRANSTHOR)  - Jul 07 2013  9:55AM     RESULT: Echocardiogram Report    Patient Name:   Dorothy Long Date of Exam: 07/07/2013  Medical Rec #:  [id number]        Custom1:  Date of Birth:  1961/10/05                  Height:       70.0 in  Patient Age:    51 years  Weight:       122.0 lb  Patient Gender: F                          BSA:          1.69 m??    Indications: CHF  Sonographer:  Sherrie Sport RDCS  Referring Phys: Monica Becton    Summary:   1. Left ventricular ejection fraction, by visual estimation, is 45 to   50%.   2. Mildly decreased global left ventricular systolic function.   3. Mild to moderately increased left ventricular internal cavity size.   4. Rheumatic mitral valve.   5. Severe mitral stenosis. Mean gradient was 14 mm Hg.   6. Severe mitral valve regurgitation.   7. Severe thickening and calcification of the posterior mitral valve   leaflet. Can not exclude a vegetation.   8. Mild aortic regurgitation.   9. Mild aortic valve sclerosis without stenosis.  10. Mild tricuspid regurgitation. Pulmonary pressure is likely     under-estimated.  11. Recommend cardiac evaluation and a transesophageal echocardiogram.  2D AND M-MODE MEASUREMENTS (normal ranges within parentheses):  Left Ventricle:          Normal  IVSd (2D):      0.77 cm (0.7-1.1)  LVPWd (2D):     1.07 cm (0.7-1.1) Aorta/LA:                  Normal  LVIDd (2D):     5.59 cm (3.4-5.7) AorticRoot (2D): 2.60 cm (2.4-3.7)  LVIDs (2D):     4.42 cm           Left Atrium (2D): 4.00 cm (1.9-4.0)  LV FS (2D):     20.9 %   (>25%)  LV EF (2D):     42.1 %   (>50%)                                    Right  Ventricle:    RVd (2D):        4.01 cm  LV DIASTOLIC FUNCTION:  MV Peak E: 1.68 m/s E/e' Ratio: 40.10  MV Peak A: 1.73 m/s Decel Time: 250 msec  E/A Ratio: 0.97  SPECTRAL DOPPLER ANALYSIS (where applicable):  Mitral Valve:  MV Max Vel:   2.61 m/s  MV P1/2 Time: 72.50 msec  MV Mean Grad: 14.0 mmHg MV Area, PHT: 3.03 cm??  Aortic Valve: AoV Max Vel: 1.30 m/s AoV Peak PG: 6.8 mmHg AoV Mean PG:  LVOT Vmax: 0.84 m/s LVOT VTI:  LVOT Diameter: 2.10 cm  AoV Area, Vmax: 2.24 cm?? AoV Area, VTI:  AoV Area, Vmn:  Tricuspid Valve and PA/RV Systolic Pressure: TR Max Velocity: 2.74 m/s RA   Pressure: 5 mmHg RVSP/PASP: 35.0 mmHg  Pulmonic Valve:  PV Max Velocity: 1.12 m/s PV Max PG: 5.1 mmHg PV Mean PG:    PHYSICIAN INTERPRETATION:  Left Ventricle: The left ventricular internal cavity size was mild to     moderately increased. LV posterior wall thickness was normal. Global LV   systolic function was mildly decreased. Left ventricular ejection   fraction, by visual estimation, is 45 to 50%. LV Diastology was not fully  evaluated.  Right Ventricle: Normal right ventricular size, wall thickness, and   systolic function.  Left Atrium: The left atrium is normal in size and structure.  Right Atrium: The right  atrium is normal in size.  Pericardium: There is no evidence of pericardial effusion.  Mitral Valve: The mitral valve is rheumatic. There is severe thickening   and calcification of the posterior mitral valve leaflet. Severe mitral   valve stenosis. Severe mitral valve regurgitation is seen. The MR jet is   eccentric posteriorly directed.  Tricuspid Valve: The tricuspid valve is normal. Mild tricuspid   regurgitation is visualized. The tricuspid regurgitant velocity is 2.74     m/s, and with an assumed right atrial pressure of 5 mmHg, the estimated   right ventricular systolic pressure is normal at 35.0 mmHg.  Aortic Valve: The aortic valve is trileaflet and structurally normal,   with normal  leaflet excursion; without any evidence of aortic stenosis or   insufficiency. Mild aortic valve sclerosis is present, with no evidence   of aortic valve stenosis. Mild aortic valve regurgitation is seen.  Pulmonic Valve: The pulmonic valve is not well seen. Mild pulmonic valve   regurgitation.  Aorta: The aorta is not well seen.  Venous: The inferior vena cava was not well visualized.    [id number] Kathlyn Sacramento MD  Electronically signed by [id number] Kathlyn Sacramento MD  Signature Date/Time: 07/07/2013/12:26:18 PM  *** Final ***    IMPRESSION: .        Verified By: Mertie Clause. 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  1) SOB/respirator distress likely multifactorial:  Anemia, diastolic CHF from underlying MR and mitral stenosis, Severe HTN (possible flash pulmonary edema), COPD -Agree with gentle diuresis, close monitoring of her renal function. -Will restart amlodipine for better BP control. -Coreg for rate control. 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  ARF/CKD stage IV:    preexisting kidney disease,   Progression, exaverbated by severe HTN,  likely contributing to anemia  4)  acute diastolic heart failure/severe mitral stenosis and mitral regurg gentle lasix  5) .  anemia of CKD:   hgb 9.2 at present,  Consider epogen  6).  malignant HTN:   coreg to 6.52m po bid.   Will add amloidpine 5 mg back (was taking at home)   Electronic Signatures: GIda Rogue(MD)  (Signed 24-Jul-14 17:48)  Authored: General Aspect/Present Illness, History and Physical Exam, Review of System, Past Medical History, Health Issues, Home Medications, Labs, EKG , Radiology, Allergies, Vital Signs/Nurse's Notes, Impression/Plan   Last Updated: 24-Jul-14 17:48 by GIda Rogue(MD)

## 2015-04-06 NOTE — Discharge Summary (Signed)
PATIENT NAME:  Dorothy Long, Dorothy Long MR#:  [id number] DATE OF BIRTH:  1961-10-09  DATE OF ADMISSION:  07/07/2013 DATE OF DISCHARGE:  07/12/2013  FINAL DIAGNOSES: 1.  Diastolic congestive heart failure. 2.  Severe mitral valve stenosis.  3.  Acute hypoxic respiratory failure on presentation due to edema in lungs, improved. 4.  Acute on chronic renal failure.  5.  Hypertension.  6.  Chronic obstructive pulmonary disease.   CONDITION ON DISCHARGE: Stable.   CODE STATUS: FULL CODE.  DISCHARGE MEDICATIONS: 1.  Amlodipine 5 mg once a day.  2.  Prednisone 20 mg 3 tablets every other day.  3.  Calcium 600 plus vitamin D once a day.  4.  Omeprazole 20 mg once a day.  5.  Acetaminophen 325 mg 2 tablets every 4 hours as needed.  6.  Alprazolam 0.25 mg every 8 hours as needed for anxiety.  7.  Carvedilol 6.25 mg 2 times a day.  8.  Advair Diskus 1 puff 2 times a day.  9.  Furosemide 40 mg once a day.   HOME OXYGEN ON DISCHARGE: No.   DIET ON DISCHARGE: Low sodium, renal diet. Diet consistency on discharge:  Regular consistency.   ACTIVITY LIMITATION: As tolerated.   TIMEFRAME TO FOLLOW-UP:  Within 1 to 2 weeks with nephrology and cardiology clinic.   HISTORY OF PRESENT ILLNESS: The patient is a 54 year old African American female with COPD and hypertension who presented to the Emergency Department with complaint of severe shortness of breath, having cough for 1 week, brought to the Emergency Department. On work-up in the ER,  found having renal insufficiency and was diffusely wheezing. Received multiple breathing treatments and Solu-Medrol with minimal improvement and so was admitted for further management. She was also found to have tachycardia and hypertension. Blood pressure was 191/110 and heart rate was 115 on admission.  HOSPITAL COURSE AND STAY: Acute hypoxic respiratory failure secondary to pulmonary edema. Echocardiogram was done which showed severe mitral stenosis and regurgitation.  Ejection fraction was 45%. She also had chronic kidney disease stage V.  Lasix 80 mg IV b.i.d. was given, and the patient started breathing better so changed to p.o. and discharged finally on p.o. Lasix daily.  We also provided oxygen support initially, but then she was doing fine and she came off the oxygen and was tolerating walking on room air without any problem.   OTHER MEDICAL ISSUES.  1.  Acute on chronic renal failure. 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 Wynelle LinkKolluru a year ago. At that time, creatinine was 1.8 so this was sudden worsening of her chronic kidney failure and urine output was still good.  Input and output monitoring was done. Lasix was given. Her kidney function, creatine remains almost same, in the range of 5 to 6, but she was not in acute need of hemodialysis. The plan was to do renal biopsy by nephrology and then discharge her home, but because she had very severely atrophic right kidney and very thin renal cortex was leftover, biopsy could not be done successfully. Dr. Thedore MinsSingh started her on steroid and discharged her home with steroid to finish the course of 1 month and advised to follow in the clinic.  2.  Anemia of chronic disease. Epogen was given in the hospital while she was here and advised to follow in the nephrology clinic and they will take further action as needed.  3.  Malignant hypertension  on presentation. She was on Lasix, Coreg, and amlodipine, and blood pressure came under control later on.  4.  Chronic obstructive pulmonary disease, which was stable in the hospital.  5.  Smoker. Counseled for 3 to 4 minutes and nicotine patch was offered and she promised to stop smoking in future.  CONSULTANTS IN THE HOSPITAL:  Dr. Mariah Milling for cardiology and Dr. Wynelle Link and Dr. Thedore Mins for nephrology.   IMPORTANT LABORATORY AND DIAGNOSTICS IN THE HOSPITAL: BUN 53. Creatinine was 5.45 on presentation. Potassium was 3.7.  Hemoglobin was 9.2 on presentation. Urinalysis was grossly negative.   Chest x-ray, portable, showed interstitial infiltrate representing component of pulmonary edema.  Echocardiogram showed severe mitral regurgitation.   Ultrasound of bilateral kidney: Medical renal disease involving the kidney.   Creatinine was still ranging in the range of 6 and 5.5 throughout the hospital course and hemoglobin remained around 8 to 9.  TIME SPENT ON THIS DISCHARGE: 45 minutes. ____________________________ Hope Pigeon Elisabeth Pigeon, MD [id number] D: 07/15/2013 13:00:58 ET T: 07/15/2013 13:13:57 ET JOB#: [id number]  cc: Hope Pigeon. Elisabeth Pigeon, MD, <Dictator> Antonieta Iba, MD Mosetta Pigeon, MD Duncan Dull, MD Altamese Dilling MD ELECTRONICALLY SIGNED 07/19/2013 23:20

## 2015-04-06 NOTE — Consult Note (Signed)
CHIEF COMPLAINT and HISTORY:  Subjective/Chief Complaint renal failure   History of Present Illness Patient is a lady with longstanding CKD who is admitted with diarrhea and dehydration and has now progressed to ESRD.  We are asked to place a Permcath.  she has lethargy, fatigue, and worsening uremic symptoms over several months.  Agrees to start HD at this time.   PAST MEDICAL/SURGICAL HISTORY:  Past Medical History:   COPD:    htn:   ALLERGIES:  Allergies:  Demerol: GI Distress  Flagyl: GI Distress  Cipro: Rash  Tetracycline: Rash  Morphine: Hallucinations  Valium: Hallucinations  HOME MEDICATIONS:  Home Medications: Medication Instructions Status  acetaminophen 325 mg oral tablet 2 tabs (631m) orally every 4 hours as needed, pain or temp. greater than 100.4 Active  amLODIPine 5 mg oral tablet 1 tab(s) orally once a day (in the morning) Active  Dexilant 60 mg oral delayed release capsule 1 cap(s) orally once a day (in the morning) Active  Advair Diskus 100 mcg-50 mcg inhalation powder 1 puff(s) inhaled 2 times a day Active  ALPRAZolam 0.25 mg oral tablet 1 tab(s) orally every 8 hours as needed for anxiety. Active  carvedilol 6.25 mg oral tablet 1 tab(s) orally 2 times a day Active  predniSONE 10 mg oral tablet 2 tabs (263m orally every other day. Active  atorvastatin 20 mg oral tablet 1 tab(s) orally once a day Active  furosemide 40 mg oral tablet 1 tab(s) orally once a day Active   Family and Social History:  Family History Non-Contributory   Social History positive  tobacco, negative ETOH   + Tobacco Current (within 1 year)   Place of Living Home   Review of Systems:  Subjective/Chief Complaint fatigue, weakness, diarrhea   Fever/Chills No   Cough Yes   Sputum No   Abdominal Pain No   Diarrhea Yes   Constipation No   Nausea/Vomiting Yes   SOB/DOE Yes   Chest Pain No   Telemetry Reviewed NSR   Dysuria No   Tolerating PT Yes   Tolerating  Diet No   Medications/Allergies Reviewed Medications/Allergies reviewed   Physical Exam:  GEN thin, appears older than stated age   HEENT pink conjunctivae, moist oral mucosa   NECK No masses  trachea midline   RESP normal resp effort  postive use of accessory muscles   CARD regular rate  no JVD   VASCULAR ACCESS none   ABD soft  normal BS   GU foley catheter in place  low UOP   LYMPH negative neck, negative axillae   EXTR negative cyanosis/clubbing, positive edema   SKIN normal to palpation, No ulcers   NEURO cranial nerves intact, motor/sensory function intact   PSYCH alert, A+O to time, place, person   LABS:  Laboratory Results: Hepatic:    04-Oct-14 12:13, Comprehensive Metabolic Panel  Bilirubin, Total 0.3  Alkaline Phosphatase 66  SGPT (ALT) 12  SGOT (AST) 19  Total Protein, Serum 6.7  Albumin, Serum 2.6  Routine Micro:    04-Oct-14 17:41, Stool Comprehensive Culture  Micro Text Report   STOOL COMPREHENSIVE    COMMENT                   NO SALMONELLA OR SHIGELLA ISOLATED    COMMENT                   NO CAMPYLOBACTER ANTIGEN DETECTED    COMMENT  NO PATHOGENIC E.COLI DETECTED     ANTIBIOTIC  Culture Comment   NO SALMONELLA OR SHIGELLA ISOLATED  Culture Comment .   NO CAMPYLOBACTER ANTIGEN DETECTED  Culture Comment    .   NO PATHOGENIC E.COLI DETECTED   Result(s) reported on 19 Sep 2013 at 10:55AM.    05-Oct-14 05:12, Urine Culture  Micro Text Report   URINE CULTURE    COMMENT                   COLONIES TOO SMALL TO READ     ANTIBIOTIC  Specimen Source   CLEAN CATCH  Culture Comment   COLONIES TOO SMALL TO READ   Result(s) reported on 19 Sep 2013 at 10:30AM.    06-Oct-14 07:58, Clostridium Diff Toxin by RT-PCR  Micro Text Report   CLOS.DIFF ASSAY, RT-PCR    COMMENT                   NEGATIVE-CLOS.DIFFICILE TOXIN NOT DETECTED BY PCR     ANTIBIOTIC  Comment  1.   NEGATIVE-CLOS.DIFFICILE TOXIN NOT DETECTED BY PCR  ----------------------------------  Test procedure integrates sample purification, nucleic acid  amplification, and detection of the target Clostridium difficile  sequence in simple or complex samplesusing real-time PCR and  RT-PCR assays.  Cardiology:    04-Oct-14 12:48, ED ECG  Ventricular Rate 76  Atrial Rate 357  QRS Duration 92  QT 388  QTc 436  R Axis 28  T Axis 49  ECG interpretation   Atrial fibrillation with premature ventricular or aberrantly conducted complexes  Abnormal ECG  When compared with ECG of 07-Jul-2013 01:29,  Atrial fibrillation has replaced Sinus rhythm  T wave amplitude has decreased in Anterior leads  ----------unconfirmed----------  Confirmed by OVERREAD, NOT ([id number]), editor PEARSON, BARBARA ([id number]) on 09/19/2013 7:55:07 AM  ED ECG   Routine Chem:    04-Oct-14 12:13, B-Type Natriuretic Peptide [facility])  B-Type Natriuretic Peptide [facility]) [phone]  Result(s) reported on 17 Sep 2013 at 01:40PM.    04-Oct-14 12:13, Comprehensive Metabolic Panel  Glucose, Serum 89  BUN 84  Creatinine (comp) 7.38  Sodium, Serum 130  Potassium, Serum 3.6  Chloride, Serum 101  CO2, Serum 12  Calcium (Total), Serum 7.7  Osmolality (calc) 286  eGFR (African American) 7  eGFR (Non-African American) 6  eGFR values <36m/min/1.73 m2 may be an indication of chronic  kidney disease (CKD).  Calculated eGFR is useful in patients with stable renal function.  The eGFR calculation will not be reliable in acutely ill patients  when serum creatinine is changing rapidly. It is not useful in   patients on dialysis. The eGFR calculation may not be applicable  to patients at the low and high extremes of body sizes, pregnant  women, and vegetarians.  Anion Gap 17    04-Oct-14 12:13, Troponin I  Result Comment   TROPONIN - RESULTS VERIFIED BY REPEAT TESTING.   - LAB/BRIDGETTE ALLISON AT 1316 09/17/13   - READ-BACK PROCESS PERFORMED.   Result(s) reported on 17 Sep 2013 at 01:18PM.    04-Oct-14  20:49, Troponin I  Result Comment   TROPONIN - RESULTS VERIFIED BY REPEAT TESTING.   - HIGH PREVIOUSLY CALLED BY LAB AT 1316 ON   - 09-17-13/RWW   Result(s) reported on 17 Sep 2013 at 09:58PM.    05-Oct-14 030:86 Basic Metabolic Panel (w/Total Calcium)  Glucose, Serum 143  BUN 87  Creatinine (comp) 7.44  Sodium, Serum 132  Potassium, Serum 3.9  Chloride, Serum 105  CO2, Serum 12  Calcium (Total), Serum 7.3  Anion Gap 15  Osmolality (calc) 294  eGFR (African American) 7  eGFR (Non-African American) 6  eGFR values <82m/min/1.73 m2 may be an indication of chronic  kidney disease (CKD).  Calculated eGFR is useful in patients with stable renal function.  The eGFR calculation will not be reliable in acutely ill patients  when serum creatinine is changing rapidly. It is not useful in   patients on dialysis. The eGFR calculation may not be applicable  to patients at the low and high extremes of body sizes, pregnant  women, and vegetarians.    05-Oct-14 04:13, Troponin I  Result Comment   TROPONIN - RESULTS VERIFIED BY REPEAT TESTING.   - ELEVATED TROPONIN PREVIOUSLY CALLED AT   - 1316 09/17/13.PMH   Result(s) reported on 18 Sep 2013 at 0[facility]    06-Oct-14 028:36 Basic Metabolic Panel (w/Total Calcium)  Glucose, Serum 147  BUN 85  Creatinine (comp) 7.38  Sodium, Serum 132  Potassium, Serum 3.4  Chloride, Serum 103  CO2, Serum 15  Calcium (Total), Serum 7.1  Anion Gap 14  Osmolality (calc) 293  eGFR (African American) 7  eGFR (Non-African American) 6  eGFR values <624mmin/1.73 m2 may be an indication of chronic  kidney disease (CKD).  Calculated eGFR is useful in patients with stable renal function.  The eGFR calculation will not be reliable in acutely ill patients  when serum creatinine is changing rapidly. It is not useful in   patients on dialysis. The eGFR calculation may not be applicable  to patients at the low and high extremes of body sizes, pregnant  women, and  vegetarians.    06-Oct-14 04:02, Ferritin ([facility] Ferritin ([facility]  Result(s) reported on 19 Sep 2013 at 04:58AM.    06-Oct-14 04:02, Iron and IBC ([facility] Iron Binding Capacity (TIBC) 180  Unbound Iron Binding Capacity 141  Iron, Serum 39  Iron Saturation 22  Result(s) reported on 19 Sep 2013 at 05:10AM.  Cardiac:    04-Oct-14 12:13, Cardiac Panel  CK, Total 32  CPK-MB, Serum 0.7  Result(s) reported on 17 Sep 2013 at 01:10PM.    04-Oct-14 12:13, Troponin I  Troponin I 0.24  0.00-0.05  0.05 ng/mL or less: NEGATIVE   Repeat testing in 3-6 hrs   if clinically indicated.  >0.05 ng/mL: POTENTIAL   MYOCARDIAL INJURY. Repeat   testing in 3-6 hrs if   clinically indicated.  NOTE: An increase or decrease   of 30% or more on serial   testing suggests a   clinically important change    04-Oct-14 20:49, Troponin I  Troponin I 0.16  0.00-0.05  0.05 ng/mL or less: NEGATIVE   Repeat testing in 3-6 hrs   if clinically indicated.  >0.05 ng/mL: POTENTIAL   MYOCARDIAL INJURY. Repeat   testing in 3-6 hrs if   clinically indicated.  NOTE: An increase or decrease   of 30% or more on serial   testing suggests a   clinically important change    05-Oct-14 04:13, Troponin I  Troponin I 0.10  0.00-0.05  0.05 ng/mL or less: NEGATIVE   Repeat testing in 3-6 hrs   if clinically indicated.  >0.05 ng/mL: POTENTIAL   MYOCARDIAL INJURY. Repeat   testing in 3-6 hrs if   clinically indicated.  NOTE: An increase or decrease   of 30% or more on serial   testing suggests a   clinically important change  Routine UA:    05-Oct-14 05:12, Urinalysis  Color (UA) Yellow  Clarity (UA) Hazy  Glucose (UA) Negative  Bilirubin (UA) Negative  Ketones (UA) Negative  Specific Gravity (UA) 1.006  Blood (UA) 1+  pH (UA) 7.0  Protein (UA)   100 mg/dL  Nitrite (UA) Negative  Leukocyte Esterase (UA) 3+  Result(s) reported on 18 Sep 2013 at 06:01AM.  RBC (UA) 3 /HPF  WBC (UA) 36 /HPF  Bacteria (UA)  1+  Epithelial Cells (UA) 2 /HPF  Result(s) reported on 18 Sep 2013 at 06:01AM.  Routine Sero:    06-Oct-14 07:58, Occult Blood, Feces  Occult Blood, Feces NEGATIVE  Result(s) reported on 19 Sep 2013 at 08:24AM.  Routine Hem:    04-Oct-14 12:13, Hemogram, Platelet Count  WBC (CBC) 7.3  RBC (CBC) 2.61  Hemoglobin (CBC) 7.7  Hematocrit (CBC) 22.3  Platelet Count (CBC) 272  Result(s) reported on 17 Sep 2013 at 12:34PM.  MCV 85  MCH 29.3  MCHC 34.4  RDW 14.6    05-Oct-14 04:13, CBC Profile  WBC (CBC) 8.0  RBC (CBC) 2.33  Hemoglobin (CBC) 6.8  Hematocrit (CBC) 19.6  Platelet Count (CBC) 265  MCV 84  MCH 29.1  MCHC 34.6  RDW 15.0  Neutrophil % 92.8  Lymphocyte % 4.7  Monocyte % 1.9  Eosinophil % 0.1  Basophil % 0.5  Neutrophil # 7.4  Lymphocyte # 0.4  Monocyte # 0.2  Eosinophil # 0.0  Basophil # 0.0  Result(s) reported on 18 Sep 2013 at 04:31AM.    06-Oct-14 04:02, CBC Profile  WBC (CBC) 9.9  RBC (CBC) 2.18  Hemoglobin (CBC) 6.3  Hematocrit (CBC) 18.1  Platelet Count (CBC) 266  MCV 83  MCH 28.8  MCHC 34.7  RDW 14.8  Neutrophil % 87.8  Lymphocyte % 5.9  Monocyte % 6.1  Eosinophil % 0.1  Basophil % 0.1  Neutrophil # 8.7  Lymphocyte # 0.6  Monocyte # 0.6  Eosinophil # 0.0  Basophil # 0.0  Result(s) reported on 19 Sep 2013 at 04:58AM.    06-Oct-14 04:02, Reticulocyte Count  Retic Count 2.70  Absolute Retic Count 0.0589  Result(s) reported on 19 Sep 2013 at 04:42AM.   RADIOLOGY:  Radiology Results: XRay:    26-Jan-14 08:51, Foot Right Complete  Foot Right Complete  REASON FOR EXAM:    injury, unable to bear weight  COMMENTS:   LMP: Post Hysterectomy    PROCEDURE: DXR - DXR FOOT RT COMPLETE W/OBLIQUES  - Jan 09 2013  8:51AM     RESULT: There is a tiny avulsed fragment laterally near the cuboid.   Etiology of this is uncertain. The bony structures otherwise appear   intact.    IMPRESSION:  Please see above.    Dictation Site: 6        Verified  By: Sundra Aland, M.D., MD    24-Jul-14 02:04, Chest Portable Single View  Chest Portable Single View  REASON FOR EXAM:    dyspnea  COMMENTS:       PROCEDURE: DXR - DXR PORTABLE CHEST SINGLE VIEW  - Jul 07 2013  2:04AM     RESULT:     Findings: There is prominence of the interstitial markings. No focal   regions of consolidation are identified. The cardiac silhouette is   moderately enlarged. The visualized bony skeleton is unremarkable.    IMPRESSION:    1. Interstitial infiltrate may represent a component of pulmonary edema.   An underlying component of  pulmonary fibrosis is also of diagnostic     consideration. No focal regions of consolidation are appreciated.    Thank you for the opportunity to contribute to the care of your patient.         Verified By: Mikki Santee, M.D., MD    26-Sep-14 16:56, Chest PA and Lateral  Chest PA and Lateral  REASON FOR EXAM:    SOB  COMMENTS:       PROCEDURE: DXR - DXR CHEST PA (OR AP) AND LATERAL  - Sep 09 2013  4:56PM     RESULT: History: Shortness of breath.    Comparison Study: Chest x-ray 07/07/2013.     Findings: Atelectasis both lung bases. Cardia megaly. No congestive heart   failure. No bony abnormality.    IMPRESSION:  Mild atelectasis lung bases.      Verified By: Osa Craver, M.D., MD    04-Oct-14 13:34, Chest PA and Lateral  Chest PA and Lateral  REASON FOR EXAM:    cough, weakness  COMMENTS:       PROCEDURE: DXR - DXR CHEST PA (OR AP) AND LATERAL  - Sep 17 2013  1:34PM     RESULT: Comparison is made to a study of September 09, 2013.    The lungs are hyperinflated with hemidiaphragm flattening. The cardiac   silhouette is enlarged. The pulmonary vascularity is not engorged. The   mediastinum is normal in width. There is no pleural effusion. Minimal   scarring at the right lung base is present.    IMPRESSION:   1. There is hyperinflation consistent with COPD. There is no evidence of    pneumonia.  2. There is mild enlargement of the cardiac chambers without overt   evidence of CHF.   3. There is no pleural effusion.     Dictation Site: 5        Verified By: DAVID A. Martinique, M.D., MD  [place]:    24-Jul-14 14:41, US Kidney Bilateral  US Kidney Bilateral  REASON FOR EXAM:    acute renal failure  COMMENTS:       PROCEDURE: [place]  - US KIDNEY  - Jul 07 2013  2:41PM     RESULT:     This study is compared to previous study dated 07/28/2012.    Findings: The right kidney measures 8.15 x 2.90 x 4.18 cm and the left   8.76 x 3.01 x 3.72 cm.    There is poor corticomedullary differentiation and increased echogenicity   within the kidneys. There is no evidence of hydronephrosis. No gross   evidence of solid or cystic masses are identified. Bilateral ureteral     jets are appreciated. The urinary bladder is distended.    IMPRESSION:  Sonographic findings consistent with a component of medical   renal disease involving the kidneys. This appears to be slightly   increased when compared to the previous study.    Thank you for the opportunity to contribute to the care of your patient.         Verified By: Mikki Santee, M.D., MD    28-Jul-14 15:15, US Kidney Bilateral  US Kidney Bilateral  REASON FOR EXAM:    severe acute renal failure, CKD stage IV  COMMENTS:       PROCEDURE: [place]  - US KIDNEY  - Jul 11 2013  3:15PM     RESULT:     Findings: The right kidney measures 8.0 x 2.90 x 3.46  cm and the left   9.50 x 3.25 x 3.22 cm. The renal cortices appear echogenic and there is   near complete effacement of the corticomedullary differentiation. Mild   hydronephrosis is identified within the right kidney. There is no   evidence of hydronephrosis within the left kidney.    IMPRESSION:   1.Mild hydronephrosis within the right kidney.  2. Echogenic kidneys and loss of the corticomedullary differentiation.   These findings are consistent with medical renal  disease.    Thank you for the opportunity to contribute to the care of your patient.         Verified By: Mikki Santee, M.D., MD  LabUnknown:    30-Dec-13 08:12, Screening Digital Mammogram  PACS Image    26-Jan-14 08:51, Foot Right Complete  PACS Image    24-Jul-14 02:04, Chest Portable Single View  PACS Image    24-Jul-14 14:41, US Kidney Bilateral  PACS Image    28-Jul-14 15:15, US Kidney Bilateral  PACS Image    26-Sep-14 16:56, Chest PA and Lateral  PACS Image    04-Oct-14 13:34, Chest PA and Lateral  PACS [place]:    30-Dec-13 08:12, Screening Digital Mammogram  Screening Digital Mammogram  REASON FOR EXAM:    SCR MAMMO NO ORDER  COMMENTS:       PROCEDURE: MAM - MAM DGTL SCRN MAM NO ORDER W/CAD  - Dec 13 2012  8:12AM       RESULT: There is no personal or family history of breast cancer. The   patient has no history of breast surgery. The breasts have a dense   parenchymal pattern without a dominant mass or malignant appearing   calcification. There is no architectural distortion. The mammographic   appearance is stable.     IMPRESSION:      1. Stable, benign appearing bilateral mammogram. Please continue to     encourage annual mammographic follow-up and monthly breast self exam.    Breast density: BIRADS type 4    BI-RADS: Category 1 - Negative    A NEGATIVE MAMMOGRAM REPORT DOES NOT PRECLUDE BIOPSY OR OTHER EVALUATION   OF A CLINICALLY PALPABLE OR OTHERWISE SUSPICIOUS MASS OR LESION. BREAST   CANCER MAY NOT BE DETECTED BY MAMMOGRAPHY IN UP TO 10% OF CASES.    Thank you for the opportunity to contribute to the care of your patient.       Dictation Site: 1     Verified By: Sundra Aland, M.D., MD   ASSESSMENT AND PLAN:  Assessment/Admission Diagnosis ESRD progression from CKD   Plan will place Permcath today eventually will need AVF or AVG   level 3   Electronic Signatures: Algernon Huxley (MD)  (Signed 06-Oct-14  15:37)  Authored: Chief Complaint and History, PAST MEDICAL/SURGICAL HISTORY, ALLERGIES, HOME MEDICATIONS, Family and Social History, Review of Systems, Physical Exam, LABS, RADIOLOGY, Assessment and Plan   Last Updated: 06-Oct-14 15:37 by Algernon Huxley (MD)

## 2015-04-06 NOTE — Discharge Summary (Signed)
PATIENT NAME:  Dorothy Long, Dorothy Long MR#:  [id number] DATE OF BIRTH:  May 19, 1961  DATE OF ADMISSION:  09/17/2013 DATE OF DISCHARGE:  09/23/2013  PRIMARY CARE PHYSICIAN:  Dr. Cherylann RatelLateef.   VASCULAR SURGEON:  Dr. Gilda CreaseSchnier.   CARDIOLOGIST:  Dr. Mariah MillingGollan.   FINAL DIAGNOSES: 1.  Worsening renal failure acidosis, now end-stage renal disease requiring dialysis.  2.  Rapid atrial fibrillation, mitral stenosis.  3.  Relative hypotension.  4.  Anemia of chronic disease.   MEDICATIONS ON DISCHARGE: Include acetaminophen 325 mg 2 tablets every 4 hours as needed for pain or temperature, Dexilant 60 mg daily, Advair Diskus 150, 1 inhalation twice a day; Xanax 0.25 mg every 8 hours as needed for anxiety, atorvastatin 20 mg daily, prednisone 10 mg half tablet every other day for 5 more doses then stop, carvedilol 6.25 mg once a day in the evening, ferrous sulfate 325 mg at bedtime.   DIET: Renal diet, regular consistency.   ACTIVITY: As tolerated.   Dr. Gilda CreaseSchnier will call you to set up graft next week. Dr. Mariah MillingGollan, cardiology, after graft placement for Coumadin start.  Dialysis on [address] at [facility] on Monday, Wednesday and Friday.   HOSPITAL COURSE:  The patient admitted 09/17/2013, discharged 09/23/2013. Came in with worsening renal failure secondary to diarrhea and dehydration with metabolic acidosis. The patient will have to be started on hemodialysis. The patient was started on IV fluid hydration with bicarb. She was initially kept on her Coreg, and her other medications were held. Laboratory and radiological data during the hospital course included a BNP of 60,664, glucose 89, BUN 84, creatinine 7.38, sodium 130, potassium 3.6, chloride 101, CO2 of 12, calcium 7.7, liver function tests normal range, albumin 2.6, white blood cell count 7.3, H and H 7.7 and 22.3, platelet count of 272, troponin borderline at 0.24. EKG: Atrial fibrillation, premature conducted complexes. Chest x-ray: Hyperinflation consistent  with COPD.  No evidence of pneumonia. No evidence of CHF.  PH showed a venous pH of 7.21. Stool culture negative. Next troponin 0.16, next troponin 0.10. Hemoglobin on the 5th dipped down to 6.8. Urine culture contaminant. Retic count 2.7, TIBC 180, iron serum 39, iron saturation 22%, ferritin 378. Hemoglobin on the 6th of 6.3. Stool for ova and parasites negative. Stool for C. diff negative. Occult blood negative. White blood cells in the stool: None. Parathyroid hormone intact 999. Hepatitis B surface antibody nonreactive. Hepatitis B surface antigen negative. The patient was transfused 1 unit of packed red blood cells. Hemoglobin upon discharge of 7.8. TSH of 1.05. Phosphorus 3.9. Creatinine upon discharge 4.21.   HOSPITAL COURSE PER PROBLEM LIST:  1.  For the patient's worsening renal failure, acidosis, now end-stage renal disease, initially, when the patient was admitted, we did not have vascular surgery coverage, so Dr. Wyn Quakerew did place a tunnelled catheter on the right chest on 09/19/2013. Dialysis was not started until the 7th. Then, the patient had dialysis on the 8th, 9th and 10th. The patient did not tolerate the dialysis well on the 9th when I was planning on sending the patient home, and I kept her an extra at night. I will send the patient home after dialysis on the 10th. I did speak with Dr. Thedore MinsSingh, who would keep the flow rate low because she becomes symptomatic afterwards with chest pain, shortness of breath, but soon after resting for a little while, she was up walking in the hallway. The morning of discharge, prior to dialysis, she looked fantastic. No complaints  and wanting to get out of the hospital. Dr. Gilda Crease did want to do a graft while the patient was in the hospital, and it is actually scheduled. I wrote no contraindications to surgery in my he notes. Anesthesia canceled the procedure secondary to atrial fibrillation. They wanted cardiology clearance. The patient was seen in consultation by  Dr. Mariah Milling and Dr. Kirke Corin, who also stated the patient can go for the procedure. Dr. Marijean Heath schedule was booked up until next week, so this will have to be done as outpatient. The patient will continue dialysis on Monday, Wednesday, Friday at [facility]. Low flow rates will definitely be needed.  2.  Rapid atrial fibrillation, after the graft is placed by Dr. Gilda Crease, will follow up with Dr. Mariah Milling and Dr. Kirke Corin of cardiology for starting of Coumadin. The patient was on Coreg when she came in. I had to hold the dose because her blood pressure was on the lower side. I restarted it, but after dialysis on the 9th, she went into rapid atrial fibrillation with heart rate up to 140. Cardiology started metoprolol. The patient was not happy with the metoprolol because she was on it in the past and it caused leg cramps. The patient can go back on her Coreg 6.25 mg once a day in the evening to control heart rate. Coreg is not dialyzed out, so it is dosed once a day in dialysis patients.  3.  Relative hypotension. Blood pressure 106/67 upon discharge. Her other blood pressure medications are stopped at this point in time.  4.  Anemia of chronic disease: The patient was transfused 1 unit of packed red blood cells during the hospital course. The patient is still anemic. Dialysis sometimes will hemoconcentrate people, so the number may improve. I did prescribe iron. Can do Procrit with dialysis as outpatient.  5.  For her acid reflux, she can stay on Dexilant.  6.  For her hyperlipidemia, she is on a statin.   TIME SPENT ON DISCHARGE: 40 minutes with coordination of care.    ____________________________ Herschell Dimes. Renae Gloss, MD rjw:dmm D: 09/23/2013 18:09:38 ET T: 09/23/2013 19:56:31 ET JOB#: [id number]  cc: Herschell Dimes. Renae Gloss, MD, <Dictator> Munsoor Lizabeth Leyden, MD Renford Dills, MD Antonieta Iba, MD [facility] [place] on [address] Duncan Dull, MD Salley Scarlet MD ELECTRONICALLY  SIGNED 10/03/2013 10:15

## 2015-04-06 NOTE — Op Note (Signed)
PATIENT NAME:  Dorothy Long, Itxel E MR#:  [id number] DATE OF BIRTH:  09-Apr-1961  DATE OF PROCEDURE:  09/19/2013  PREOPERATIVE DIAGNOSES:   1.  End-stage renal disease.  2.  Hypertension.   POSTOPERATIVE DIAGNOSES: 1.  End-stage renal disease.  2.  Hypertension.   PROCEDURES: 1.  Ultrasound guidance for vascular access to right internal jugular vein.  2.  Fluoroscopic guidance for placement of catheter.  3. Placement of a 19 cm tip-to-cuff tunneled hemodialysis catheter via the right internal jugular vein.   SURGEON:  Annice NeedyJason S. Vana Arif, MD  ANESTHESIA: Local with moderate conscious sedation.   BLOOD LOSS: 25 mL.   INDICATION FOR PROCEDURE: This is a 54 year old African American female with chronic kidney disease that has now progressed to end-stage renal failure.  We were asked to place a Perm-cath.   DESCRIPTION OF THE PROCEDURE: The patient was brought to the vascular and interventional radiology suite. The patient's right neck and chest were sterilely prepped and draped and a sterile surgical field was created. The right internal jugular vein was visualized with ultrasound and found to be patent. It was then accessed under direct ultrasound guidance and a permanent image was recorded. A wire was placed. After a skin nick and dilatation, the peel-away sheath was placed over the wire.   I then turned my attention to an area under the clavicle. Approximately 2 fingerbreadths below the clavicle a small counter incision was created and we tunneled from the subclavicular incision to the access site. Using fluoroscopic guidance, a 19 cm tip-to-cuff tunneled hemodialysis catheter was selected, tunneled from the subclavicular incision to the access site. It was then placed through the peel-away sheath and the peel-away sheath was removed. The catheter tips were parked in the right atrium. The appropriate distal connectors were placed. It withdrew blood well and flushed easily with heparinized saline and  a concentrated heparin solution was then placed. It was secured to the chest wall with 2 Prolene sutures. The access incision was closed with a single 4-0 Monocryl. A 4-0 Monocryl pursestring suture was placed around the exit site. Sterile dressings were placed.   The patient tolerated the procedure well and was taken to the recovery room in stable condition.     ____________________________ Annice NeedyJason S. Kiran Carline, MD jsd:cc D: 09/19/2013 16:05:18 ET T: 09/19/2013 20:27:05 ET JOB#: [id number]  cc: Annice NeedyJason S. Olajuwon Fosdick, MD, <Dictator> Annice NeedyJASON S Taj Nevins MD ELECTRONICALLY SIGNED 10/05/2013 10:23

## 2015-04-06 NOTE — H&P (Signed)
PATIENT NAME:  Dorothy Long, Dorothy Long MR#:  [id number] DATE OF BIRTH:  July 29, 1961  DATE OF ADMISSION:  07/07/2013  PRIMARY CARE PHYSICIAN: Dr. Duncan Dull.   REFERRING PHYSICIAN: Dr. Bayard Males.   CHIEF COMPLAINT: Shortness of breath.   HISTORY OF PRESENT ILLNESS: Dorothy Long is a 54 year old African American female with a past medical history of COPD, hypertension. Presented to the Emergency Department with complaint of severe shortness of breath. The patient has been having cough for the last 1 week which has been gradually getting worse. Last night, went to bed not feeling well. Woke up in the middle of the night with severe shortness of breath. Concerning this, called EMS and was brought to the Emergency Department. On workup in the Emergency Department, the patient has renal insufficiency which the patient states is chronic. Initially, the patient was bilaterally diffusely wheezing. The patient received multiple breathing treatments and Solu-Medrol with some improvement. The patient was also found to be tachycardic and hypertensive with a blood pressure of 191/110, with a heart rate of 115.    PAST MEDICAL HISTORY:  1. Hypertension.  2. COPD.   3. Chronic kidney disease.  4. Anemia.  5. Continued tobacco use.   ALLERGIES:  1. DEMEROL.  2. FLAGYL.  3. CIPRO.  4. TETRACYCLINE.  5. VALIUM.   HOME MEDICATIONS: Amlodipine 5 mg once a day.   SOCIAL HISTORY: Continues to smoke 1 pack a day. Denies drinking alcohol or using illicit drugs. Married, lives with her husband. Works as a Engineer, site.  FAMILY HISTORY: History of hypertension, heart problems.   REVIEW OF SYSTEMS:  CONSTITUTIONAL: Has generalized weakness, fatigue.  EYES: No change in vision.  ENT: No change in hearing. No sore throat.  RESPIRATORY: Has cough, shortness of breath.  CARDIOVASCULAR: No chest pain, palpitations. No lower extremity swelling.  GASTROINTESTINAL: No nausea, vomiting or abdominal pain.   GENITOURINARY: No dysuria or hematuria.  ENDOCRINE: No polyuria or polydipsia.  HEMATOLOGIC: No easy bruising or bleeding.  SKIN: No rash or lesions.  MUSCULOSKELETAL: No joint pains or swelling.  NEUROLOGIC: No weakness or numbness in any part of the body.   PHYSICAL EXAMINATION:  GENERAL: This is a thin-built, cachectic-looking female lying down in the bed, not in distress, is able to speak full sentences.  VITAL SIGNS: Temperature 97.8, pulse 115, blood pressure 191/108, respiratory rate of 24, oxygen saturation is 92% on 2 liters of oxygen.  HEENT: Head normocephalic, atraumatic. There is no scleral icterus. Conjunctivae normal. Pupils equal and react to light. Mucous membranes moist. No pharyngeal erythema.   NECK: Supple. No lymphadenopathy. No JVD. No carotid bruit.  CHEST: No focal tenderness. Bilateral diffuse wheezing and bibasilar crackles are heard.  HEART: S1 and S2, regular, tachycardia with multiple PVCs, irregular heartbeats. No pedal edema. Pulses 2+.  ABDOMEN: Bowel sounds present. Soft, nontender, nondistended. No hepatosplenomegaly.  EXTREMITIES: No pedal edema.   MUSCULOSKELETAL: Good range of motion in all of the extremities.  SKIN: No rash or lesions.  NEUROLOGIC: The patient is alert, oriented to place, person and time. Cranial nerves II through XII intact. Motor 5/5 in upper and lower extremities. No sensory deficits.   LABS: CMP: BUN 53, creatinine of 5.45, bicarb 15. The rest of all of the values are within normal limits.   CBC: WBC of 5.6, hemoglobin 9.2, platelet count of 166. Troponin 0.4, CK-MB 1.7, CK 206.   ASSESSMENT AND PLAN: Dorothy Long is a 54 year old female who comes to the Emergency Department with  fever, shortness of breath.  1. Chronic obstructive pulmonary disease exacerbation: Continue with breathing treatments, Solu-Medrol, Rocephin and Zithromax.  2. Congestive heart failure: Highly concerning about the patient's tachycardia, accelerated  hypertension, bibasilar crackles and paroxysmal nocturnal dyspnea. Admit the patient to a monitored bed. Continue to cycle cardiac enzymes x3 concerning the patient's multiple risk factors. BNP may not be informative; however, will have a baseline. Keep the patient on also Lasix 40 mg intravenous concerning the patient's renal insufficiency as well as accelerated hypertension.  3. Hypertension, accelerated: The patient is on amlodipine. Add Coreg and hydralazine. Will get echocardiogram.  4. Chronic renal insufficiency: We do not know the patient's baseline. Will diurese and control the blood pressure and follow up.  5. Anemia of most likely chronic kidney disease: Will continue to follow up.  6. Continued tobacco use: Counseled with the patient extensively. The patient expressed understanding. We counseled for 3 minutes.  7. Keep the patient on deep vein thrombosis prophylaxis with Lovenox.   TIME SPENT: 55 minutes.   ____________________________ Susa GriffinsPadmaja Arrie Zuercher, MD pv:gb D: 07/07/2013 05:08:21 ET T: 07/07/2013 05:39:14 ET JOB#: [id number]  cc: Susa GriffinsPadmaja Ladina Shutters, MD, <Dictator> Duncan Dulleresa Tullo, MD Susa GriffinsPADMAJA Johni Narine MD ELECTRONICALLY SIGNED 07/24/2013 21:40

## 2015-04-06 NOTE — Consult Note (Signed)
General Aspect Dorothy Long is a pleasant 54 year old woman with COPD, hypertension, presenting to the hospital 07/07/2013 with severe shortness of breath, pulmonary edema, renal failure, severe hypertension.  She presents with diarrhea, malaise, statrted on HD this admission. noted to be in atrial fibrillation on arrival. Cardiology was consulted for preop eval and atrial fib.  She reports having diarrhea 2 weeks ago. Sx recurred last week. Now resolved. Etiology not clear. ("from flu shot?").  She denies palpitations or SOB. Malaise yesterday with first HD. Otherwise feels ok. Some chronic mid thoracic back pain.  On her last admission, she received multiple breathing treatments, Solu-Medrol with improvement. ---Echocardiogram suggested significant mitral valve disease with moderate to severe stenosis and regurgitation (regurgitation with eccentric and difficult to estimate). Ejection fraction 45-50%, right ventricular systolic pressure 35 mm mercury ----found to be anemic with hemoglobin 9.2 --- she had biopsy done one year ago showing focal segmental glomerulosclerosis at [facility]. Creatinine at that time was 1.8.  In the hospital recent creatinine 5-6 ---Ultrasound in the hospital showed atrophic right kidney, very thin renal cortex, biopsy could not be done  ---She was started on steroids by Dr. Candiss Norse,  It was recommended that she stop smoking.  She was seen in clinic on 07/28/2013 and reported feeling well. no further episodes of shortness of breath as she had on previous admission. Some mild fatigue, mild shortness of breath with heavy exertion. Otherwise she felt "okay".   Present Illness . SOCIAL HISTORY:  She continues to smoke, trying to quit now. Denies any drinking or alcohol habits or drug use. She is married. Lives with husband and was working as a Radio producer, but for the last few months she is not able to go to the work.   FAMILY HISTORY:  Hypertension and heart problem runs in the  family.   Physical Exam:  GEN well developed, well nourished, no acute distress, thin   HEENT hearing intact to voice, moist oral mucosa   NECK supple   RESP normal resp effort  clear BS   CARD Irregular rate and rhythm  Murmur   Murmur Systolic  Diastolic   ABD denies tenderness  soft   LYMPH negative neck   EXTR negative edema   SKIN normal to palpation   NEURO motor/sensory function intact   PSYCH alert, A+O to time, place, person, good insight     COPD:    htn:        Admit Diagnosis:   DIARRHEA ARF A FIB: Onset Date: 21-Sep-2013, Status: Active, Description: DIARRHEA ARF A FIB  Home Medications: Medication Instructions Status  acetaminophen 325 mg oral tablet 2 tabs (645m) orally every 4 hours as needed, pain or temp. greater than 100.4 Active  amLODIPine 5 mg oral tablet 1 tab(s) orally once a day (in the morning) Active  Dexilant 60 mg oral delayed release capsule 1 cap(s) orally once a day (in the morning) Active  Advair Diskus 100 mcg-50 mcg inhalation powder 1 puff(s) inhaled 2 times a day Active  ALPRAZolam 0.25 mg oral tablet 1 tab(s) orally every 8 hours as needed for anxiety. Active  carvedilol 6.25 mg oral tablet 1 tab(s) orally 2 times a day Active  predniSONE 10 mg oral tablet 2 tabs (224m orally every other day. Active  atorvastatin 20 mg oral tablet 1 tab(s) orally once a day Active  furosemide 40 mg oral tablet 1 tab(s) orally once a day Active   Lab Results:  Routine Chem:  08-Oct-14 09:40  Phosphorus, Serum  7.4 (Result(s) reported on 21 Sep 2013 at 10:27AM.)  Glucose, Serum  103  BUN  66  Creatinine (comp)  5.97  Sodium, Serum  133  Potassium, Serum 4.3  Chloride, Serum 101  CO2, Serum  20  Calcium (Total), Serum  8.4  Anion Gap 12  Osmolality (calc) 286  eGFR (African American)  9  eGFR (Non-African American)  8 (eGFR values <28m/min/1.73 m2 may be an indication of chronic kidney disease (CKD). Calculated eGFR is useful in  patients with stable renal function. The eGFR calculation will not be reliable in acutely ill patients when serum creatinine is changing rapidly. It is not useful in  patients on dialysis. The eGFR calculation may not be applicable to patients at the low and high extremes of body sizes, pregnant women, and vegetarians.)   Radiology Results: XRay:    04-Oct-14 13:34, Chest PA and Lateral  Chest PA and Lateral   REASON FOR EXAM:    cough, weakness  COMMENTS:       PROCEDURE: DXR - DXR CHEST PA (OR AP) AND LATERAL  - Sep 17 2013  1:34PM     RESULT: Comparison is made to a study of September 09, 2013.    The lungs are hyperinflated with hemidiaphragm flattening. The cardiac   silhouette is enlarged. The pulmonary vascularity is not engorged. The   mediastinum is normal in width. There is no pleural effusion. Minimal   scarring at the right lung base is present.    IMPRESSION:   1. There is hyperinflation consistent with COPD. There is no evidence of   pneumonia.  2. There is mild enlargement of the cardiac chambers without overt   evidence of CHF.   3. There is no pleural effusion.     Dictation Site: 5        Verified By: DAVID A. JMartinique M.D., MD    Demerol: GI Distress  Flagyl: GI Distress  Cipro: Rash  Tetracycline: Rash  Morphine: Hallucinations  Valium: Hallucinations  Vital Signs/Nurse's Notes: **Vital Signs.:   08-Oct-14 14:17  Vital Signs Type Admission  Temperature Temperature (F) 98.3  Celsius 36.8  Temperature Source oral  Pulse Pulse 83  Respirations Respirations 20  Systolic BP Systolic BP 1053 Diastolic BP (mmHg) Diastolic BP (mmHg) 78  Mean BP 88  Pulse Ox % Pulse Ox % 96  Pulse Ox Activity Level  At rest  Oxygen Delivery Room Air/ 21 %    Impression Dorothy Long a pleasant 54year old woman with COPD, hypertension, presenting to the hospital 07/07/2013 with severe shortness of breath, pulmonary edema, renal failure, severe hypertension,  moderate to severe MR and mitral stenosis, now presenting with diarrhea, malaise, statrted on HD this admission. noted to be in atrial fibrillation on arrival. Cardiology was consulted for preop eval and atrial fib. AV graft placement delayed today.   1) Atrial fib Likely secondary to underlying structural heart disease and recent diarrhea Rate reasonably well controlled Would continue coreg BID for rate control hold amlodipine in case higher dose of coreg needed for better rate control.  After AV graft placement, would wait 24 to 48 hours then start anticoagulation (warfarin, possibly eliquis 2.5 mg BID)  (she uses HD cath for HD currently so bleeding will not be an issue until AV graft is used) --Will arrange outpt follow up in the office. 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 This is a chronic issue, not acute and she has been relatively asymptomatic, until now (new atrial fib) --Tee at some point to evaluate valve (as an outpt after AV graft placement)  3) End stage renal disease: On HD AV graft placement cancelled secondary to atrial fib --Would proceed with AV graft placement as inpt or outpt No further cardiac workup needed  4)   Anemia of chronic kidney disease PRBC given yesterday   Plan . 5)  Tobacco abuse -     We have encouraged her to continue to work on weaning her cigarettes and smoking cessation. She will continue to work on this and does not want any assistance with chantix.      6)   Hypertension -  Could hold amloidpine, continue coreg BID for rate control   Electronic Signatures: Ida Rogue (MD)  (Signed 08-Oct-14 18:37)  Authored:  General Aspect/Present Illness, History and Physical Exam, Past Medical History, Health Issues, Home Medications, Labs, Radiology, Allergies, Vital Signs/Nurse's Notes, Impression/Plan   Last Updated: 08-Oct-14 18:37 by Ida Rogue (MD)

## 2015-04-06 NOTE — H&P (Signed)
PATIENT NAME:  Dorothy Long, Dorothy Long DATE OF BIRTH:  1961-02-07  DATE OF ADMISSION:  09/09/2013  REFERRING PHYSICIAN:  Mady HaagensenMunsoor Lateef, MD  PRIMARY CARE PHYSICIAN: Dr. Duncan Dulleresa Tullo.   CHIEF COMPLAINT: Diarrhea.   HISTORY OF PRESENT ILLNESS: A 54 year old female who has chronic renal failure since. She was in 12th grade and she had some renal biopsy done at that time and then worsening renal functions. [facility] and she had a trial of renal biopsy which was failed and since then she is on steroid for kidney failure and following with nephrologist and possibly a potential candidate to start on hemodialysis very soon. She had complaint of diarrhea for the last 3 days which was totally watery and so she decided to go to Dr. Garnett FarmLateef's office to see him and Dr. Cherylann RatelLateef saw her today. She had excessive diarrhea which was watery and her blood pressure was running on the lower normal side so he decided to send her in for direct admission. On further questioning, the patient denies any fever but she agrees for chills for the last 2 to 3 days. She states that there is nobody in the family or her nearby circle with similar complaints and she does not think she ate or drink anything which was unhygienic. She has mild abdominal dull pain because of diarrhea and diarrhea is totally watery, no presence of blood in that, feeling mildly nauseous but no vomiting.   REVIEW OF SYSTEMS  CONSTITUTIONAL: Negative for fever. Positive for fatigue and generalized weakness secondary to diarrhea, no pain or weight loss.  EYES: No blurring, double vision, pain or redness.  EARS, NOSE, THROAT: No tinnitus, ear pain or hearing loss.  RESPIRATORY: No cough, wheezing, hemoptysis but feeling a little short of breath and weak now because of diarrhea for the last 3 days.  CARDIOVASCULAR: No chest pain, orthopnea, edema or palpitations.  GASTROINTESTINAL: The patient has some nausea but no vomiting,  has excessive, diarrhea which is watery. No abdominal distention, mild abdominal dull pain. No blood in the stool.  GENITOURINARY: No dysuria, hematuria or increased frequency.  ENDOCRINE: No increased sweating, heat or cold intolerance.  MUSCULOSKELETAL: No pain or swelling in the joints.  NEUROLOGICAL: No numbness, weakness, dysarthria, tremors or vertigo.  PSYCHIATRIC: No anxiety, insomnia or bipolar disorder.   PAST MEDICAL HISTORY 1.  Hypertension.  2.  COPD.   3.  Chronic kidney disease.  4.  Anemia.  5.  Continued tobacco use.    ALLERGIES 1.  DEMEROL.  2.  FLAGYL.  3.  CIPRO.  4.  TETRACYCLINE.  5.  VALIUM.    HOME MEDICATIONS  1.  Xanax 0.25 mg 3 times a day.  2.  Amlodipine 5 mg once a day.  3.  Atorvastatin 20 mg at night.  4.  Coreg 6.25 mg twice a day.  5.  Lasix 40 mg once a day.  6.  Dexilant 60 mg once a day  7.  Prednisone 10 mg, 2 tablets every other day.  8.  Advair Diskus inhalation 2 times a day.   SOCIAL HISTORY: She continued to smoke, trying to quit down now. Denies any drinking or alcohol use and denies any drug use. She is married. Lives with husband and working as a Chartered loss adjusterschoolteacher.   FAMILY HISTORY:  Hypertension and heart problems run in the family.   PHYSICAL EXAMINATION VITAL SIGNS:  Temperature 98.5, pulse 87, respirations 20, blood pressure 101/70 and pulse  ox 97% on room air.  GENERAL: The patient is fully alert, oriented to time, place and person. She is cachectic but does not appear in any acute distress.  HEENT: Head and neck atraumatic. Conjunctivae pale. Oral mucosa moist.  NECK: Supple. No JVD.  RESPIRATORY: Bilateral clear and equal entry.  CARDIOVASCULAR: S1, S2 present, regular. No murmur.  ABDOMEN: Mild tenderness present. No organomegaly. Bowel sounds present.  SKIN: No rashes.  LEGS: No edema.  JOINTS: No swelling or tenderness.  NEUROLOGICAL: Power 5 out of 5. Follows commands. No tremors or rigidity.  PSYCHIATRIC: Does not  appear in any acute psychiatric illness at this time.   IMPORTANT LABORATORY RESULTS: Glucose 97, BUN 69, creatinine 6.56, sodium 130, potassium 3.9, chloride 102, CO2 of 15, with calcium level 7.0, and albumin 2.9. WBC 9.6, hemoglobin 8.5, platelets 181. Chest x-ray, PA and lateral is done which showed mild atelectasis  lung bases.   ASSESSMENT AND PLAN: A 54 year old female with a past medical history of chronic renal failure, hypertension, chronic obstructive pulmonary disease, smoker and anemia presented to hospital by Dr. Cherylann Ratel, direct admission for severe diarrhea and mild hypotension.  1.  Diarrhea. Will check stool for Clostridium difficile  as she is on prednisone chronically because of her kidney failure and she is immunosuppressed. Most likely this appears to be viral diarrhea as there is not any much fever and not associated with excessive vomiting too. We will give Zofran and loperamide for symptomatic management meanwhile and will check stool for ova and parasites too.  Will get blood culture and gastroenterology consult for further management of this issue.  2.  Chronic renal failure. This is a stable issue and will get nephrology consult in the hospital. Maybe she might need hemodialysis soon.  3.  Hypertension. The patient is on low-normal blood pressure currently, systolic is running 100 to 90, so we would like to avoid all hypertensive medication at this time and will give gentle hydration as she is not able to tolerate oral intake because of severe diarrhea.  4.  Anemia. This is chronic because of kidney problem and we will just continue monitoring.  5.  Chronic obstructive pulmonary disease. The patient is a chronic smoker and she is using Advair 2 puffs inhaled daily. We will keep her DuoNeb. Currently, she is feeling mildly short of breath but this is, I think, overall because of diarrhea. We will give her inhaled bronchodilators as-needed basis.  6.  Smoking. Smoking cessation  counseling is done for 4 minutes and offered her nicotine. She agreed to use nicotine gum while in the hospital.  7.  CODE STATUS: Full code.   TOTAL TIME SPENT: 50 minutes in this admission.   ____________________________ Hope Pigeon Elisabeth Pigeon, MD vgv:cs D: 09/09/2013 17:26:00 ET T: 09/09/2013 17:40:59 ET JOB#: [id number]  cc: Lennox Pippins, MD Hope Pigeon Elisabeth Pigeon, MD, <Dictator> Duncan Dull, MD   Altamese Dilling MD ELECTRONICALLY SIGNED 09/09/2013 18:42

## 2015-04-06 NOTE — Discharge Summary (Signed)
PATIENT NAME:  Dorothy Long, Dorothy Long MR#:  [id number] DATE OF BIRTH:  13-Feb-1961  DISCHARGE DIAGNOSES:  1.  Viral gastroenteritis.  2.  Dehydration.  3.  Chronic kidney disease, stage V.  4.  Hypomagnesemia.  5.  Anemia of chronic disease.   CONSULTANTS: Dr. Thedore MinsSingh with nephrology.   IMAGING STUDIES: Include a chest x-ray, PA and lateral, which showed no acute abnormalities.   ADMITTING HISTORY AND PHYSICAL AND HOSPITAL COURSE: Please see detailed H and P dictated by Dr. Elisabeth PigeonVachhani. In brief, a 54 year old [place] female patient with history of CKD, stage V, who presented to the office of Dr. Cherylann RatelLateef, nephrologist, with 3 days of nausea, vomiting and diarrhea. The patient was found to be severely dehydrated and admitted to the hospitalist service directly from the nephrology clinic.   The patient has stool ordered for C. diff, stool WBC, but her diarrhea had resolved by the time she arrived to the hospital and could not get any of these tests done. The patient tolerated her food well. She hydrated aggressively with IV fluids. By the day of discharge, the patient feels back to baseline with no nausea or vomiting, diarrhea, tolerating food, no abdominal tenderness on examination, and has requested to be discharged home.   The patient was seen by Dr. Thedore MinsSingh of nephrology. The patient will follow up at the nephrology clinic after discharge.   DISCHARGE MEDICATIONS: Include:  1.  Amlodipine 5 mg oral once a day.  2.  Acetaminophen 325 mg two tablets every 4 hours as needed for pain and temperature.  3.  Alprazolam 0.25 mg oral every 8 hours as needed for anxiety.  4.  Coreg 6.25 mg oral 2 times a day.  5.  Advair Diskus 100/50, one puff inhaled 2 times a day.  6.  Prednisone 20 mg two tablets oral once a day.   DISCHARGE INSTRUCTIONS: Renal diet. Activity as tolerated. Follow up with Dr. Cherylann RatelLateef with nephrology in 1 to 2 weeks.    ____________________________ Molinda BailiffSrikar R. Tamecka Milham,  MD srs:np D: 09/12/2013 15:49:48 ET T: 09/12/2013 19:56:05 ET JOB#: [id number]  cc: Wardell HeathSrikar R. Elpidio AnisSudini, MD, <Dictator> Munsoor Lizabeth LeydenN. Lateef, MD Orie FishermanSRIKAR R Raeford Brandenburg MD ELECTRONICALLY SIGNED 09/14/2013 15:31

## 2015-04-06 NOTE — Op Note (Signed)
PATIENT NAME:  Dorothy Long, Dorothy Long MR#:  [id number] DATE OF BIRTH:  1961-08-17  DATE OF PROCEDURE:  10/26/2013  PREOPERATIVE DIAGNOSIS:  End-stage renal disease requiring hemodialysis.   POSTOPERATIVE DIAGNOSIS:  End-stage renal disease requiring hemodialysis.   PROCEDURE PERFORMED: Creation of a left arm radiocephalic fistula.   SURGEON: Renford DillsGregory G. Georgiana Spillane, M.D.   ANESTHESIA: General by LMA.   FLUIDS: Per anesthesia record.   ESTIMATED BLOOD LOSS: Minimal.   SPECIMEN: None.   INDICATIONS: [name] is a 54 year old woman who will now be initiating hemodialysis and therefore requires access. Risks and benefits were reviewed, all questions answered. The patient has agreed to proceed with surgery.   DESCRIPTION OF PROCEDURE: The patient is taken to the Operating Room and placed in the supine position. After adequate general anesthesia is induced, appropriate invasive monitors are placed. She is positioned supine with the left arm extended palm upward and left arm is prepped and draped in a sterile fashion. The arm is then inspected and a rather prominent cephalic vein at the level of the wrist is identified. The decision is then made to create a left radiocephalic fistula. Marcaine 0.25% without epinephrine is infiltrated in the soft tissues and a small incision is made midway between the radial impulse and the vein.   The vein is then dissected circumferentially and marked with a surgical marker. The radial artery is then dissected circumferentially and looped with Silastic vessel loops. The vein is then ligated with a 3-0 silk tie distally. It dilates to approximately 2 mm. It is flushed with heparinized saline. Arteriotomy is then made and stay sutures of 6-0 Prolene are placed, and an end-vein-to-side radial artery anastomosis is fashioned using running 6-0 Prolene. Flushing maneuvers are performed and flow is established into the fistula. Excellent thrill is noted and the vein dilates  quite nicely.   A large side branch approximately 3 cm from the level of the anastomosis is identified and a small incision is created over the branch. It is then looped with a 3-0 silk tie and ligated. Both wounds are then inspected for hemostasis, irrigated and the wrist incision is closed in layers using 3-0 interrupted Vicryl followed by 4-0 Monocryl subcuticular. The incision for ligating the branch was closed with 4-0 Monocryl. The patient tolerated the procedure well. There were no immediate complications. Sponge and needle counts were correct, and she is taken to the recovery area in excellent condition.    ____________________________ Renford DillsGregory G. Edelyn Heidel, MD ggs:cs D: 10/26/2013 17:17:47 ET T: 10/26/2013 20:02:06 ET JOB#: [id number]  cc: Renford DillsGregory G. Barri Neidlinger, MD, <Dictator> Renford DillsGREGORY G Avid Guillette MD ELECTRONICALLY SIGNED 11/21/2013 17:15

## 2015-04-06 NOTE — H&P (Signed)
PATIENT NAME:  Dorothy Long, Dorothy Long MR#:  [id number] DATE OF BIRTH:  1961-08-06  DATE OF ADMISSION:  09/17/2013  PRIMARY CARE PHYSICIAN: Dr. Cherylann Ratel.  REFERRING PHYSICIAN:  Dr. Sharyn Creamer.  CHIEF COMPLAINT:  Diarrhea.   HISTORY OF PRESENTING ILLNESS:  This is a 54 year old female with a past medical history of chronic renal failure, following with nephrologist for this issue, who was recently admitted to hospital last week because of diarrhea and dehydration, and sent home the next day because her diarrhea resolved. Now she came back today to the hospital as for the last 4 to 5 days she again started having diarrhea, which is watery, almost 7 to 8 times in a day, and she is not able to eat or drink enough because of this diarrhea and the nausea sensation what she is having. She denies any vomiting or presence of blood in the stool or vomitus, and she does not have any pain in her abdomen, but she has some pain in her right back, which is in the middle zone, denies fever or chills. As she was not eating or drinking enough because of this complaint, she called Dr. Garnett Farm office today, and she was advised to go to the Emergency Room and Dr. Cherylann Ratel was also concerned that probably she might need to be started on hemodialysis soon now. In ER, she was also found having new-onset a-fib and the hospitalist service is being contacted for further management of her case overall.   REVIEW OF SYSTEMS: CONSTITUTIONAL:  Negative for fever. Positive for fatigue and generalized weakness. No pain or weight loss.  EYES:  No blurring, double vision, redness or discharge.  ENT:  No tinnitus, ear pain or hearing loss.  RESPIRATORY:  No cough, wheezing, or shortness of breath.  CARDIOVASCULAR:  No chest pain, orthopnea, edema or palpitations.  GASTROINTESTINAL:  Has nausea and diarrhea, but denies any vomiting and no abdominal pain. No presence of blood in the stool.  GENITOURINARY:  Has decreased urination overall for  the last few days, but has some burning with the urination.  ENDOCRINE:  No increased sweating. No heat or cold intolerance.  MUSCULOSKELETAL:  No pain or swelling in the joints.  NEUROLOGICAL:  No numbness, weakness, tremors or vertigo.  PSYCHIATRIC:  No anxiety, insomnia or bipolar disorder.   PAST MEDICAL HISTORY: 1.  Hypertension.  2.  COPD.  3.  Chronic kidney disease.  4.  Anemia.  5.  Continued tobacco use.   SOCIAL HISTORY:  She continues to smoke, trying to quit now. Denies any drinking or alcohol habits or drug use. She is married. Lives with husband and was working as a [employer] loss adjuster, but for the last few months she is not able to go to the work.   FAMILY HISTORY:  Hypertension and heart problem runs in the family.   MEDICATIONS AS PER THE DISCHARGE ON LAST WEEK:  She was discharged on:  1.  Amlodipine 5 mg once a day.  2.  Acetaminophen 325 mg 2 tablets every 4 hours for pain or temperature.  3.  Alprazolam 0.25 mg every 8 hours as needed for anxiety.  4.  Carvedilol 6.25 mg oral tablet 2 times a day.  5.  Advair Diskus 1 puff 2 times a day.  6.  Prednisone 20 mg 2 tablets every other day.   DIET ON DISCHARGE:  Renal diet.   PHYSICAL EXAMINATION:  VITAL SIGNS:  In the ER, temperature 98.2, pulse 83, respirations 20, blood pressure 106/72 and  pulse ox 100% on oxygen supplementation, 2 L and 98% on room air later on.  GENERAL:  The patient is fully alert and oriented. She is thin and cachectic.   HEENT:  Head and neck atraumatic. Conjunctivae pale. Oral mucosa moist.  NECK:  Supple. No JVD.  RESPIRATORY:  Bilateral clear and equal air entry.  CARDIOVASCULAR:  S1, S2 present, irregular. No murmur.  ABDOMEN:  No tenderness bowel sounds present. No organomegaly.  SKIN:  No rashes.  LEGS:  No edema. On the right middle zone of the back there is tenderness on local palpation. No costovertebral angle tenderness.  NEUROLOGICAL:  Power 4/5, generalized weakness. No tremors,  follows commands.  IMPORTANT LABORATORY AND RADIOLOGICAL RESULTS:  Glucose 89. BNP 60,664, BUN 84, creatinine 7.38, sodium 130, potassium 3.6, chloride 101 and bicarb is 12, calcium 7.7. Troponin 0.24. WBC 7.3, hemoglobin 7.7, platelet count 272, pH 7.21 and pCO2 22.   X-RAY:   Hyperinflation consistent with COPD, no evidence of pneumonia, mild enlargement of cardiac chambers without evidence of CHF. No pleural effusion.   EKG:  Atrial fibrillation with a rate around 90 to 95.   ASSESSMENT AND PLAN:  A 54 year old female with a past medical history of chronic renal failure and currently on steroid therapy for that, following with nephrologist regularly, came to the hospital because of diarrhea and possible dehydration, found having new-onset atrial fibrillation in ER.  1.  Acute worsening of chronic renal failure due to diarrhea and dehydration with metabolic acidosis. I spoke to nephrologist, Dr. Cherylann RatelLateef and the patient might need to be started on hemodialysis in this admission. He will discuss with the Vascular Surgery to get access for hemodialysis. Meanwhile, we will start her and maintain on bicarbonate drip IV, and also to help her with renal that and will follow two more troponin.  3.  Hypertension. We will continue carvedilol.  4.  Chronic anemia secondary to renal failure. We will continue monitoring.  5.  Continued tobacco use. Smoking cessation counseling done for 5 minutes and advised her to use nicotine patch.  6.  Continued diarrhea. This is on and off complaint as last week it resolved while she was in the hospital. We will checked the stool for any infection, most likely it might be due to the renal failure and uremia. Right now I do not see any requirement of giving her antibiotics. We will continue monitoring.   TOTAL TIME SPENT ON THIS ADMISSION:  50 minutes.   ____________________________ Hope PigeonVaibhavkumar G. Elisabeth PigeonVachhani, MD vgv:jm D: 09/17/2013 14:43:09 ET T: 09/17/2013 15:05:43  ET JOB#: [id number]  cc: Hope PigeonVaibhavkumar G. Elisabeth PigeonVachhani, MD, <Dictator> Munsoor Lizabeth LeydenN. Lateef, MD Altamese DillingVAIBHAVKUMAR Jarom Govan MD ELECTRONICALLY SIGNED 09/26/2013 23:03

## 2015-04-07 NOTE — H&P (Signed)
PATIENT NAME:  Dorothy Long, Dorothy Long MR#:  [id number] DATE OF BIRTH:  Mar 10, 1961  DATE OF ADMISSION:  05/03/2014  PRIMARY CARE PHYSICIAN: Duncan Dulleresa Tullo, MD  NEPHROLOGIST: Mosetta PigeonHarmeet Singh, MD, and Munsoor Lizabeth LeydenN. Lateef, MD  CHIEF COMPLAINT: Chest pain and fluid overload.   HISTORY OF PRESENT ILLNESS: This is a 54 year old female who is training for peritoneal dialysis, still doing hemodialysis. She was over at dialysis today, where she developed chest pain in the center of her chest radiating to her back and jaw, 7 to 8 out of 10 in intensity, was given 3 nitroglycerin with slight help. Now she just has a soreness in her back. She did have some sweating, some shortness of breath, and felt fluid overloaded. Some nausea, but no vomiting. She had a weight of 57.9 kg over at dialysis. Her normal weight is 120 pounds. Hospitalist services were contacted for further evaluation.   PAST MEDICAL HISTORY: End-stage renal disease on hemodialysis, hypertension, paroxysmal atrial fibrillation, congestive heart failure, mitral stenosis and mitral regurgitation, COPD, anemia of chronic disease.   PAST SURGICAL HISTORY: PD catheter placement, left Port-A-Cath placement and removal, left forearm AV fistula.   ALLERGIES: CIPROFLOXACIN, DEMEROL, FLAGYL, FLUCONAZOLE, MORPHINE, TETRACYCLINE, AND VALIUM.   MEDICATIONS: As per Prescription Writer include acetaminophen 325 mg 2 tablets every 4 hours as needed, acetaminophen/hydrocodone 325/5 mg 1 tablet every 6 hours as needed for pain, Xanax 0.25 mg every 8 hours as needed for anxiety, amlodipine 5 mg once a day, Benadryl 25 mg 1 tablet 3 times a week on Monday, Wednesday, Friday before dialysis; calcium acetate 667 mg 3 capsules 3 times a day with meals, cinacalcet 30 mg 1 tablet daily, Dexilant 60 mg 1 tablet daily, ferrous sulfate 324 mg 1 tablet at bedtime, Spiriva 1 inhalation daily, Ventolin CFC 1 puff 4 times a day as needed for shortness of breath, Zofran ODT 4 mg 3 times a  day as needed for nausea.   SOCIAL HISTORY: Smokes 1/2 pack per day. No alcohol. No drug use.   FAMILY HISTORY: Mother died of colorectal cancer. Father died of an MI.   REVIEW OF SYSTEMS:    CONSTITUTIONAL: Positive for sweating at night. No fever or chills. Positive for fatigue. Positive for weight gain.  EYES: She does wear glasses.  EARS, NOSE, MOUTH AND THROAT: No hearing loss. No sore throat. No difficulty swallowing.  CARDIOVASCULAR: Positive for chest pain. No palpitations.  RESPIRATORY: Positive for shortness of breath. Positive for dry cough. No sputum. No hemoptysis.  GASTROINTESTINAL: Positive for nausea. No vomiting. Positive for abdominal cramping. Positive for constipation alternating with diarrhea. No bright red blood per rectum. No melena.  GENITOURINARY: No burning on urination. No hematuria.  MUSCULOSKELETAL: Positive for leg pain with the fluid and cannot walk very well.  PSYCHIATRIC: No anxiety or depression.  ENDOCRINE: No thyroid problems.  HEMATOLOGIC AND LYMPHATIC: History of anemia.   PHYSICAL EXAMINATION:  VITAL SIGNS: Last included a pulse of 95, respirations 20, blood pressure 133/90, pulse oximetry 99% on room air.  GENERAL: No respiratory distress.  EYES: Conjunctivae normal. Lids normal. Pupils equal, round, and reactive to light. Extraocular muscles intact. No nystagmus.  EARS, NOSE, MOUTH AND THROAT: Tympanic membranes: No erythema. Nasal mucosa: No erythema. Throat: No erythema. No exudate seen. Lips and gums: No lesions.  NECK: No lymphadenopathy. No thyromegaly. No thyroid nodules palpated. No JVD.  CARDIOVASCULAR: S1, S2 normal. Positive murmur, II/VI.  LUNGS: Decreased breath sounds bilateral bases. Positive rhonchi, right base greater than left.  No use of accessory muscles to breathe.  ABDOMEN: Soft, nontender. No organomegaly/splenomegaly. Normoactive bowel sounds. No masses felt.  LYMPHATIC: No lymph nodes in the neck.  MUSCULOSKELETAL: Trace  edema. No clubbing. No cyanosis.  SKIN: No ulcers or lesions seen.  NEUROLOGIC: Cranial nerves II through XII grossly intact. Deep tendon reflexes 1+ bilateral lower extremities.  PSYCHIATRIC: The patient is oriented to person, place, and time.   LABORATORY AND RADIOLOGICAL DATA: Chest x-ray: Mild pulmonary vascular congestion, decreased size of small bilateral pleural effusions. Troponin negative. CPK 101. White blood cell count 5.6, hemoglobin and hematocrit 11.8 and 36.0, platelet count of 170. Glucose 124, BUN 47, creatinine 7.33, sodium 137, potassium 4.0, chloride 102, CO2 of 20, calcium 8.3. Liver function tests: Normal range. Albumin low at 2.7. BNP [id number]. EKG showed normal sinus rhythm, 87 beats per minute, premature ventricular complex.   ASSESSMENT AND PLAN: 1.  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 Case discussed with Dr. Thedore Mins, nephrology, to continue dialysis session for today at lower flow rate, which may be contributing to the patient's chest pain. I will get serial cardiac enzymes. ER physician spoke with Dr. Mariah Milling, cardiology, who will see the patient in consultation. The patient had a negative stress test within the year, so I will not order any stress test at this point in time. I will get an echocardiogram to recheck ejection fraction. Last echocardiogram showed an ejection fraction of 45% to 50% with severe mitral stenosis and mitral regurgitation.  2.  Hypertension: Blood pressure a little borderline at this point in time. I will not medicate before dialysis, but ER physician did put a nitroglycerin patch on her. Continue Lasix and amlodipine.  3.  Paroxysmal atrial fibrillation: Not on any blood thinners, currently in sinus rhythm.  4.  Chronic obstructive pulmonary disease: Continue Spiriva and Ventolin CFC.  5.  Anemia of chronic disease: Hemoglobin actually  acceptable at this point in time.  6.  Tobacco abuse: Smoking cessation counseling done 3 minutes by me. Nicotine patch ordered.   TIME SPENT ON ADMISSION: 55 minutes.    ____________________________ Herschell Dimes. Renae Gloss, MD rjw:jcm D: 05/03/2014 16:04:52 ET T: 05/03/2014 18:16:41 ET JOB#: [id number]  cc: Herschell Dimes. Renae Gloss, MD, <Dictator> Duncan Dull, MD Mosetta Pigeon, MD Munsoor Lizabeth Leyden, MD   Salley Scarlet MD ELECTRONICALLY SIGNED 05/04/2014 19:37

## 2015-04-07 NOTE — H&P (Signed)
PATIENT NAME:  Dorothy Long, Dorothy Long MR#:  [id number] DATE OF BIRTH:  [birth year]  DATE OF ADMISSION:  01/19/2014  PRIMARY CARE PHYSICIAN:  Dr. Darrick Huntsmanullo.   REFERRING EMERGENCY ROOM PHYSICIAN:  Dr. Carollee MassedKaminski.   CHIEF COMPLAINT:  Sharp pain all over the chest radiating to the back associated with cough for the past three days.   HISTORY OF PRESENT ILLNESS:  The patient is a 54 year old female with a past medical history of end-stage renal disease on hemodialysis on Tuesday, Thursday and Saturday, congestive heart failure, COPD, paroxysmal atrial fibrillation not on any anticoagulation, is presenting to the ER with a chief complaint of three day history of productive cough with yellowish phlegm and sharp, pleuritic chest pain in the chest radiating to the left side of the back.  Denies any dizziness, loss of consciousness.  She is running low-grade temperature of 99 degrees Fahrenheit.  The patient used to have a left-sided Port-A-Cath for her dialysis which was pulled out on last Monday.  Her last dialysis was yesterday and next dialysis is due tomorrow.  Denies any abdominal pain, nausea or vomiting.  The patient was given Dilaudid in the ER, following that the patient felt nauseous.  During my examination, she is reporting that she is feeling weak and tired and wants to rest.  Husband is at bedside.  No other complaints.   PAST MEDICAL HISTORY:  1.  End-stage renal disease on hemodialysis for the past eight months on Tuesday, Thursday and Saturday.  2.  Hypertension.  3.  Paroxysmal atrial fibrillation, not on any anticoagulation.  4.  Congestive heart failure.  5.  COPD.  6.  Anemia of chronic disease.   PAST SURGICAL HISTORY:  Left Port-A-Cath placement and removal on last Monday.  Left forearm AV fistula placement.   ALLERGIES:  CIPRO, FLOXACIN, DEMEROL, FLAGYL, FLUCONAZOLE, MORPHINE, TETRACYCLINE AND VALIUM.   PSYCHOSOCIAL HISTORY:  Lives at home, lives with husband.  Continues to smoke half pack  a day.  Denies alcohol or illicit drug usage.   FAMILY HISTORY:  Hypertension, heart disease runs in her family.    HOME MEDICATIONS:  Ventolin 1 puff inhalation 4 times a day, Dexilant 60 one capsule by mouth once daily, calcium is 2 to 3 capsules by mouth 3 times a day, Benadryl 1 tablet by mouth every other day, Monday, Wednesday, Friday before dialysis, amlodipine 5 mg once daily, alprazolam 0.25 mg by mouth q. 8 hours, Tylenol 325 mg 2 tablets by mouth every four hours.    REVIEW OF SYSTEMS: CONSTITUTIONAL:  Denies any fatigue.  Complaining of low-grade fever.  EYES:  Denies blurry vision, double vision.  EARS, NOSE, THROAT:  Denies epistaxis or discharge.  RESPIRATION:  Complaining of productive cough for the past three days, has history of COPD.  CARDIOVASCULAR:  Complaining of sharp, pleuritic chest pain radiating to the back on the left side.  Denies palpitation.  GASTROINTESTINAL:  Complaining of nausea.  Denies vomiting, diarrhea.  No abdominal pain.  GENITOURINARY:  No dysuria or hematuria.  GYNECOLOGIC AND BREAST:  Denies breast mass or vaginal discharge.  ENDOCRINE:  Denies polyuria, nocturia, thyroid problems.  INTEGUMENTARY:  No acne, rash, lesions.  HEMATOLOGIC AND LYMPHATIC:  Chronic anemia is present.  No bleeding.  MUSCULOSKELETAL:  No joint pain in the neck and back.  Denies gout.  NEUROLOGIC:  No vertigo or ataxia.  PSYCHIATRIC:  No ADD, OCD.   PHYSICAL EXAMINATION: VITAL SIGNS:  Temperature 99.2, pulse 103, respirations 20, blood pressure 126/80 and  pulse ox 93% on 2 liters.  GENERAL APPEARANCE:  Not under acute distress, thin and emaciated female.  HEENT:  Normocephalic, atraumatic.  Pupils are equally reacting to light and accommodation.  No scleral icterus.  No conjunctival injection.  No sinus tenderness.  Moist mucous membranes.  NECK:  Supple.  No JVD.  No thyromegaly.  LUNGS:  Diminished breath sounds at the left lower lung.  Moderate air entry on the left  upper lobe and the right side of the lung.  No wheezing.  Minimal crackles and rales at the left lower base.  CARDIOVASCULAR:  Irregularly irregular.  No anterior chest wall tenderness on palpation.  No peripheral edema.  GASTROINTESTINAL:  Soft.  Bowel sounds are positive in all four quadrants.  Nontender, nondistended.  No hepatosplenomegaly.  No masses felt.  NEUROLOGIC:  Awake, alert, oriented x 3.  Motor and sensory grossly intact.  Cranial nerves II through XII are intact.  Reflexes are 2+.  EXTREMITIES:  No edema.  No cyanosis.  No clubbing.  SKIN:  Warm to touch.  Normal turgor.  No rashes.  No lesions.  MUSCULOSKELETAL:  No joint effusion, tenderness, erythema.  PSYCHIATRIC:  Normal mood and affect.   LABORATORIES AND IMAGING STUDIES:  Chest x-ray PA and lateral view:  New left basilar air space disease and (Dictation Anomaly) <<MISSING TEXT>> small effusion is worrisome for pneumonia.  No change in the small right effusion and mild basilar atelectasis.  Glucose 104, BUN 25, creatinine 5.13, sodium 128, potassium 4.0, chloride 95, CO2 24, GFR 11, serum osmolality 262, calcium 8.8.  LFTs are normal except albumin which is low at 2.8.  Troponin 0.04.  WBC 10.1, hemoglobin 9.7, hematocrit 29.2, platelets are 187.  A 12-lead EKG has revealed sinus tachycardia at 107 beats per minute, normal PR and QRS interval.  Left axis deviation.  PVCs were noticed with (Dictation Anomaly) <<MISSING TEXT>> complexes, no acute ST-T wave changes.   ASSESSMENT AND PLAN:  A 54 year old female presenting to the ER with a chief complaint of sharp chest pain all over the chest radiating to the left backside associated with productive cough for the past three days will be admitted with the following assessment and plan:  1.  Left lower lobe pneumonia with pleuritic chest pain.  We will admit her to tele bed.  We will provide her IV Rocephin and Zithromax.  Tylenol as needed for mild to moderate pain and morphine IV as  needed for severe pain.  2.  End-stage renal disease.  We will continue hemodialysis on Tuesday, Thursday and Saturday.  Nephrology consult is placed to Dr. Cherylann Ratel.  3.  Chronic hyponatremia.  We will continue close monitoring of the sodium.  4.  Moderate malnutrition, albumin is at 2.8.  We will check prealbumin.  5.  Chronic anemia, probably from end-stage renal disease.  Continue monitoring hemoglobin.  6.  Chronic obstructive pulmonary disease, stable at this time.  We will provide her inhalers as-needed basis.  7.  Paroxysmal atrial fibrillation, rate controlled, not on any anticoagulation.  8.  Hypertension.  Continue home medication amlodipine, blood pressure is stable at this time.  9.  We will provide her gastrointestinal and deep vein thrombosis prophylaxis with Protonix and heparin subQ.  10.  CODE STATUS:  SHE IS FULL CODE.  Husband is the medical power of attorney.    Diagnosis and plan of care was discussed in detail with the patient.  She is aware of the plan.   Total  time spent on the admission is 45 minutes.     ____________________________ Ramonita Lab, MD ag:ea D: 01/19/2014 00:15:32 ET T: 01/19/2014 00:50:27 ET JOB#: [id number]  cc: Ramonita Lab, MD, <Dictator> Duncan Dull, MD

## 2015-04-07 NOTE — Discharge Summary (Signed)
PATIENT NAME:  Dorothy Long, [name] MR#:  [id number] DATE OF BIRTH:  [birth year]  DATE OF ADMISSION:  05/03/2014 DATE OF DISCHARGE:  05/04/2014  ADMISSION DIAGNOSES:  1.  Chest pain.  2.  Fluid overload.  3.  Hypertension. 4.  Paroxysmal atrial fibrillation. 5.  Chronic obstructive pulmonary disease.   CONSULTATIONS:  1.  Dr. Kirke CorinArida.  2.  Dr. Thedore MinsSingh from nephrology.   PERTINENT LABORATORIES: Troponins x 3 were negative.   HOSPITAL COURSE:  A very pleasant 54 year old female with end-stage renal disease on hemodialysis, changing to peritoneal dialysis, who presented with chest pain during dialysis. For further details, please refer to the H and P.  1.  Chest pain. This is likely not cardiac in nature, but more likely related to her pulmonary edema. The patient's troponins were negative. Telemetry was negative. Cardiology was consulted. She does have severe mitral stenosis with regurgitation, which could be contributing to chest pain as well. Echocardiogram has been ordered and we are awaiting the results of this echocardiogram, but as per Dr. Kirke CorinArida, no significant changes. The patient will be ready for discharge.  2.  End-stage renal disease on hemodialysis. Appreciate nephrology consultation. She did require hemodialysis while in the hospital. She had a peritoneal dialysis catheter that was also drained, as per Dr. Thedore MinsSingh. She will continue on her biweekly hemodialysis and be transitioned to PD.  3.  Dysuria. UA was negative for UTI.  4. Acute on chronic systolic heart failure with severe mitral stenosis and mitral regurgitation. The patient is continued on outpatient medications. Echocardiogram is pending.   DISCHARGE MEDICATIONS: 1.  Acetaminophen 325, 2 tablets q.4 hours p.r.n. pain.  2.  Dexilant 60 mg daily. 3.  Xanax 0.25 mg q.8 hours.   4.  Ventolin 1 puff 4 times a day as needed.  5.  Calcium acetate 667,  3 capsules t.i.d.  6.  Tiotropium 18 mg daily.  7.  Acetaminophen/hydrocodone  325/5 q.6 hours p.r.n.  8.  Zofran 4 mg 3 times a day as needed.  9.  Benadryl 25 mg  3 times a week on Monday, Wednesday and Friday before dialysis.  10.  Ferrous sulfate 324 daily.  11.  Cinacalcet  30 mg daily.  12.  Lasix 80 mg daily.  13.  Norvasc 2.5 mg b.i.d.  14.  Nicotine patch 7 mg per 24 hours.   DISCHARGE DIET: Renal diet.   DISCHARGE ACTIVITY: As tolerated.   DISCHARGE FOLLOWUP:  The patient can follow up with Dr. Mariah MillingGollan in 1 week, as well as Dr. Duncan Dulleresa Tullo.   TIME SPENT:  35 minutes.   ____________________________ Janyth ContesSital P. Juliene PinaMody, MD spm:dmm D: 05/04/2014 11:53:17 ET T: 05/04/2014 12:20:08 ET JOB#: [id number]  cc: Lenyx Boody P. Juliene PinaMody, MD, <Dictator> Duncan Dulleresa Tullo, MD Antonieta Ibaimothy J. Gollan, MD Janyth ContesSITAL P Sharmin Foulk MD ELECTRONICALLY SIGNED 05/04/2014 16:42

## 2015-04-07 NOTE — H&P (Signed)
PATIENT NAME:  Dorothy Long, DROZDOWSKI MR#:  [id number] DATE OF BIRTH:  1961-07-25  DATE OF ADMISSION:  02/13/2014  REFERRING PHYSICIAN: Dr. Chiquita Loth.   PRIMARY CARE PHYSICIAN: Dr. Duncan Dull.   PRIMARY NEPHROLOGIST: Dr. Thedore Mins.   CHIEF COMPLAINT: Shortness of breath, cough.   HISTORY OF PRESENT ILLNESS: This is a 54 year old female with significant past medical history of end-stage renal disease on hemodialysis Tuesday, Thursday, Saturday, congestive heart failure, COPD, paroxysmal atrial fibrillation, not on anticoagulation. The patient was recently discharged from [facility] for community-acquired pneumonia where she did have right lung infiltrate then. The patient was discharged home, presents today with complaints of shortness of breath, wheezing, and cough with productive sputum, yellow in color over the last two days. Upon presentation, the patient did receive nebulizer treatment with much help of her shortness of breath. The patient was saturating 96% on room air. The patient had chest x-ray done which did show  basilar opacity/infiltrate, which appeared to be on the left side, which is new. The hospitalist requested to admit the patient for healthcare acquired pneumonia. The patient received IV vancomycin and Zosyn in the ED. The patient was found to have mildly elevated troponin at 0.06, but she has chronically elevated troponin. This appears to be her baseline. She denies any chest pain, has no abnormalities in her EKG. As well, the patient is known to have history of anemia due  end-stage renal disease, her hemoglobin appears to be stable today at level of 10, last time on discharge was 8.7.   PAST MEDICAL HISTORY: 1. End-stage renal disease on hemodialysis Tuesday, Thursday, Saturday. Received her dialysis on Saturday without any complication.  2. Hypertension.  3. Paroxysmal after fibrillation, not on any anticoagulation.   4. Congestive heart failure.  5. COPD. 6. Anemia of  chronic disease.   PAST SURGICAL HISTORY: 1. Left Port-A-Cath placement and removal.  2. Left forearm AV fistula placement.   ALLERGIES:  1. CIPRO.  2. OFLOXACIN.  3. DEMEROL.  4. FLAGYL. 5. FLUCONAZOLE.  6. MORPHINE.  7. TETRACYCLINE.  8. VALIUM.  SOCIAL HISTORY: The patient lives at home. Lives with her family. The patient continues smoke up to 1/2 pack per day. Denies any alcohol or illicit drug use.   FAMILY HISTORY: Significant for hypertension and coronary artery disease in the family.   HOME MEDICATIONS: 1. Tylenol as needed.  2. Benadryl before dialysis.  3. Alprazolam 0.25 mg orally every eight hours as needed.  4. Ventolin as needed.  5. Amlodipine 5 mg oral daily.  6. Calcium acetate 667 mg oral 3 capsules 3 times a day.  7. Dexilant 60 mg oral daily.   REVIEW OF SYSTEMS:  CONSTITUTIONAL: The patient denies fever, chills. Complains of fatigue, weakness. Denies weight gain, weight loss.  EYES: Denies blurry vision, double vision, inflammation, glaucoma.  ENT: Denies tinnitus, ear pain, hearing loss, epistaxis or discharge.  RESPIRATORY: Reports cough, wheezing, dyspnea, and history of COPD. CARDIOVASCULAR: The patient denies any chest pain, edema, palpitation, syncope. GASTROINTESTINAL: Denies nausea, vomiting, diarrhea, abdominal pain, hematemesis, jaundice.  GENITOURINARY: Denies dysuria, hematuria, renal colic.   ENDOCRINE: Denies polyuria, polydipsia, heat or cold intolerance.  HEMATOLOGY: Denies anemia, easy bruising, bleeding diathesis.  INTEGUMENTARY: Denies acne, rash or skin lesion.  MUSCULOSKELETAL: Denies any swelling, gout, cramps.  NEUROLOGIC: Denies history of CVA, TIA. Denies any headache, vertigo, tremor.  PSYCHIATRIC: Denies depression or insomnia. Reports history of anxiety.   PHYSICAL EXAMINATION: VITAL SIGNS: Temperature 97.2, pulse 106, respiratory rate  20, blood pressure 119/87, saturating 95% on oxygen.  GENERAL: Well-nourished,  thin-appearing female who looks comfortable and in no apparent distress.  HEENT: Head normocephalic. Pupils equal, reactive to light. Pink conjunctivae. Anicteric sclerae. Moist oral mucosa. Wearing nasal cannula.  NECK: Supple. No thyromegaly. No carotid bruits. No lymphadenopathy.  CHEST: The patient had fair air entry bilaterally with scattered wheezing and bibasilar rhonchi.  CARDIOVASCULAR: S1, S2 heard. No rubs, murmur or gallops.  ABDOMEN: Soft, nontender, nondistended. Bowel sounds present.  EXTREMITIES: No edema. No clubbing. No cyanosis. Pedal pulses and radial pulses +2 bilaterally.  PSYCHIATRIC: Pleasant, awake, alert x 3. Intact judgment and insight.  NEUROLOGIC: Cranial nerves grossly intact. Motor five out of five. No focal deficits.  SKIN: Normal skin turgor. Warm and dry.  MUSCULOSKELETAL: No joint effusion or erythema. As well, the patient had left arm AV fistula, with good bruits can be heard.   PERTINENT LABORATORY DATA: Glucose 92, BUN 23, creatinine 5.44, sodium 136, potassium 4.3. chloride 101, CO2 24, troponin 0.06. White blood cell 5.6, hemoglobin 10.10, hematocrit 32, platelet 178.   Chest x-ray: Small bilateral pleural effusion, left more than right stable from the last x-ray. Basilar lung opacities, which could represent atelectasis or consolidation.    ASSESSMENT AND PLAN: 1.Healthcare acquired pneumonia. The patient was recently discharged, presents with cough productive sputum with evidence of basilar opacity on the chest x-ray. The patient will be started on IV vancomycin and Zosyn for that pneumonia. 2.  Chronic obstructive pulmonary disease exacerbation, the patient presented initially with wheezing. We will start her on low-dose Solu-Medrol, as well we will have her on nebulizer and DuoNebs every six hours and p.r.n. albuterol.  3. Elevated troponins, the patient had chronically elevated troponin at baseline. This is most likely due to end stage renal disease,  she denies any chest pain. The patient has no EKG changes. We will cycle cardiac enzymes and follow the trend.  4. Hypertension: Blood pressure is acceptable. Continue with home meds.  5. History of paroxysmal atrial fibrillation. The patient currently in normal sinus rhythm.  6. Congestive heart failure, there appears to be no evidence of volume overload in the patient. Her bilateral pleural effusion seems to be stable from previous x-ray, as well, the patient she is on dialysis.  7. Anemia of chronic disease appears to be stable, actually improving.  8. Deep vein thrombosis prophylaxis. Subcutaneous heparin.  9. Gastrointestinal prophylaxis: The patient is on PPI.  10. CODE STATUS: THE PATIENT IS FULL CODE. Husband is her medical power of attorney.   TOTAL TIME FOR ADMISSION AND PATIENT CARE: 50 minutes.    ____________________________ Starleen Armsawood S. Briannon Boggio, MD dse:sg D: 02/13/2014 07:40:56 ET T: 02/13/2014 12:51:01 ET JOB#: [id number]  cc: Starleen Armsawood S. Madelena Maturin, MD, <Dictator> Darrah Dredge Teena IraniS Marda Breidenbach MD ELECTRONICALLY SIGNED 02/14/2014 0:53

## 2015-04-07 NOTE — Consult Note (Signed)
General Aspect Primary cardiologist: Dr. Rockey Situ Dorothy Long is a pleasant 54 year old woman with COPD, hypertension, presenting to the hospital [place] during dialysis.  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  She presented with sharp substernal chest pain with no radiation during dialysis. it lasted for about 15 minutes. TnI were negative. ECG without acute changes.  Echocardiogram 09/26/2013 at [place] normal left ventricular systolic function, diastolic dysfunction, Biatrial enlargement Mild aortic regurgitation The mitral leaflets are thickened and appear rheumaticically deformed. There is heavy mitral annular calcification. The there is mild mitral stenosis with a calculated valve area of 1.6 cm2 by pressure half-time equation and moderate, eccentric mitral regurgitation Mild to moderate pulmonary hypertension   Stress test 09/28/13 IMPRESSION:   1. No convincing evidence of pharmacologically induced myocardial ischemia.   2. Apparent anterior apical defect that is present only on attenuation corrected images is favored to reflect artifact due to overcorrection and may be worse on stress imaging secondary to greater patient motion. A small area of ischemia is felt less likely but is difficult to entirely exclude.   3. LVEF = 55%.   Present Illness . SOCIAL HISTORY:  She continues to smoke, trying to quit now. Denies any drinking or alcohol habits or drug use. She is married. Lives with husband and was working as a Radio producer, but for the last few months she is not able to go to the work.   FAMILY HISTORY:  Hypertension and heart problem runs in the family.   Physical Exam:  GEN well developed, well nourished, no acute distress, thin   HEENT hearing intact to voice, moist oral mucosa   NECK supple   RESP normal resp effort  clear BS   CARD Irregular rate and  rhythm  Murmur   Murmur Systolic  Diastolic   ABD denies tenderness  soft   LYMPH negative neck   EXTR negative edema   SKIN normal to palpation   NEURO motor/sensory function intact   PSYCH alert, A+O to time, place, person, good insight   Review of Systems:  Subjective/Chief Complaint chest pain   General: No Complaints   Skin: No Complaints   ENT: No Complaints   Eyes: No Complaints   Neck: No Complaints   Respiratory: No Complaints   Cardiovascular: Chest pain or discomfort   Gastrointestinal: No Complaints   Genitourinary: No Complaints   Vascular: No Complaints   Musculoskeletal: No Complaints   Neurologic: No Complaints   Hematologic: No Complaints   Endocrine: No Complaints   Family & Social History:  Family and Social History:  Family History Negative   Social History positive  tobacco   Lab Results: General Ref:  20-May-15 18:16   HBsAg ========== TEST NAME ==========  ========= RESULTS =========  = REFERENCE RANGE =  HEPATITIS B SURFACE AG  HBsAg Screen HBsAg Screen                    [   Negative             ]          Negative               [facility]            No: [id number]           [address] [place], [place] [address]  Lindon Romp, MD         [phone]   Result(s) reported on 04 May 2014 at 09:18AM.  Routine Chem:  20-May-15 18:16   Phosphorus, Serum  8.0 (Result(s) reported on 03 May 2014 at 07:46PM.)  Glucose, Serum  123  BUN  51  Creatinine (comp)  8.04  Sodium, Serum 140  Potassium, Serum 4.0  Chloride, Serum 104  CO2, Serum 21  Calcium (Total), Serum  8.0  Anion Gap 15  Osmolality (calc) 294  eGFR (African American)  6  eGFR (Non-African American)  5 (eGFR values <64m/min/1.73 m2 may be an indication of chronic kidney disease (CKD). Calculated eGFR is useful in patients with stable renal function. The eGFR calculation will not be reliable in acutely ill patients when  serum creatinine is changing rapidly. It is not useful in  patients on dialysis. The eGFR calculation may not be applicable to patients at the low and high extremes of body sizes, pregnant women, and vegetarians.)  Cholesterol, Serum 120  Triglycerides, Serum 120  HDL (INHOUSE) 44  VLDL Cholesterol Calculated 24  LDL Cholesterol Calculated 52 (Result(s) reported on 03 May 2014 at 06:40PM.)  Cardiac:  20-May-15 18:16   Troponin I 0.03 (0.00-0.05 0.05 ng/mL or less: NEGATIVE  Repeat testing in 3-6 hrs  if clinically indicated. >0.05 ng/mL: POTENTIAL  MYOCARDIAL INJURY. Repeat  testing in 3-6 hrs if  clinically indicated. NOTE: An increase or decrease  of 30% or more on serial  testing suggests a  clinically important change)  Routine Hem:  20-May-15 18:16   WBC (CBC) 5.2  RBC (CBC) 3.85  Hemoglobin (CBC)  11.3  Hematocrit (CBC) 35.0  Platelet Count (CBC) 174  MCV 91  MCH 29.4  MCHC 32.3  RDW  19.0  Neutrophil % 64.3  Lymphocyte % 22.2  Monocyte % 9.8  Eosinophil % 2.5  Basophil % 1.2  Neutrophil # 3.4  Lymphocyte # 1.2  Monocyte # 0.5  Eosinophil # 0.1  Basophil # 0.1 (Result(s) reported on 03 May 2014 at 06:42PM.)   EKG:  EKG NSR   Interpretation LVH with repolarization changes   Radiology Results: XRay:    20-May-15 14:42, Chest PA and Lateral  Chest PA and Lateral   REASON FOR EXAM:    Chest Pain  COMMENTS:       PROCEDURE: DXR - DXR CHEST PA (OR AP) AND LATERAL  - May 03 2014  2:42PM     CLINICAL DATA:  Chest pain after dialysis.  Fluid overload.    EXAM:  CHEST  2 VIEW    COMPARISON:  03/22/2014    FINDINGS:  The cardiac silhouette remains moderately enlarged, unchanged. The  patient has taken a greater inspiration than on the prior study with  improved aeration of the right lung base. Mild linear opacity  remains in the right lung base. There are small bilateral pleural  effusions, slightly decreased from the prior study. Mild  pulmonary  vascular congestion is similar to the prior study. Focal linear  opacity in the left mid lung may represent subsegmental atelectasis.  Is no evidence of pneumothorax.    IMPRESSION:  Greater inspiration with improved aeration in the right lung base.  Cardiomegaly and mild pulmonary vascular congestion persist, with  decreased size of small bilateral pleural effusions.      Electronically Signed    By: ALogan Bores   On: 05/03/2014 15:22         Verified By: AZenia Resides  T. Jeralyn Ruths, M.D.,    Cipro: Rash  Tetracycline: Rash  Demerol: GI Distress  Flagyl: GI Distress  Valium: Hallucinations  Morphine: Hallucinations  Fluconazole: Unknown  Vital Signs/Nurse's Notes: **Vital Signs.:   21-May-15 07:30  Vital Signs Type Q 4hr  Temperature Temperature (F) 98  Celsius 36.6  Temperature Source oral  Pulse Pulse 92  Respirations Respirations 20  Systolic BP Systolic BP 525  Diastolic BP (mmHg) Diastolic BP (mmHg) 85  Mean BP 102  Pulse Ox % Pulse Ox % 95  Pulse Ox Activity Level  At rest  Oxygen Delivery Room Air/ 21 %    Impression Dorothy Long is a pleasant 54 year old woman with COPD, hypertension, ESRD on HD and mitral valve disease. Presented with chest pain.   1)  Chest pain: overall atypical. Negative enzymes . NO ischemic ECG changes. Stress test last year was fine.   2) Mitral valve stenosis with regurgitation    Likely rheumatic mitral valve disease.  echo pending today.   3) End stage renal disease: On HD   4)   Anemia of chronic kidney disease   Plan 5)  Tobacco abuse -        6)   Hypertension -  mildly elevated.   OK to discharge home after echo if stable.   Electronic Signatures: Kathlyn Sacramento (MD)  (Signed 21-May-15 09:40)  Authored: General Aspect/Present Illness, History and Physical Exam, Review of System, Family & Social History, Labs, EKG , Radiology, Allergies, Vital Signs/Nurse's Notes, Impression/Plan   Last Updated: 21-May-15  09:40 by Kathlyn Sacramento (MD)

## 2015-04-07 NOTE — Discharge Summary (Signed)
PATIENT NAME:  Dorothy Long, Dorothy Long MR#:  [id number] DATE OF BIRTH:  11-25-1961  DATE OF ADMISSION:  11/12/2014 DATE OF DISCHARGE:  11/14/2014  PRIMARY CARE PHYSICIAN:  Duncan Dulleresa Tullo, MD.  FINAL DIAGNOSES: 1.  Chronic obstructive pulmonary disease exacerbation.  2.  Possible pneumonia.  3.  End-stage renal disease on peritoneal dialysis.  4.  Hypertension.  5.  Atrial fibrillation.   MEDICATIONS ON DISCHARGE: Include Xanax 0.25 mg every 8 hours as needed for anxiety, Spiriva 1 inhalation daily, Benadryl 25 mg 3 times a week on Monday, Wednesday, Friday before dialysis, Cinacalcet 30 mg once a day, furosemide 80 mg 1/2 tablet twice a day as needed for extra fluid, Norco 5/325 one tablet every 6 hours as needed for moderate pain, Flonase nasal spray 50 mcg/inhalations 2 sprays each nostril daily, metolazone 5 mg 1 tablet daily, as needed, metoprolol succinate 25 mg daily, Ventolin CFC 1 puff 4 times a day as needed for shortness of breath and wheezing, calcium acetate 667 mg, 3 capsules 3 times a day with meals, albuterol 2.5 mg inhaled 4 times a day as needed for shortness of breath, prednisone 20 mg 2 tablets daily once a day for 4 days then stop, cefuroxime 500 mg daily for 7 days, azithromycin 250 mg daily for 2 more days, dextromethorphan, guaifenesin, 10 mg/100 mg 5 mL oral liquid every 4 hours as needed for cough, nicotine inhalation device 10 mg 1 inhaled 12 times a day as needed for nicotine craving. Flovent HFA CFC 1 puff twice a day.   DIET: Low sodium diet, regular consistency.   ACTIVITY: As tolerated.   FOLLOWUP:  Nephrology routine followup 1 to 2 weeks with Dr. Darrick Huntsmanullo.   HOSPITAL COURSE: The patient was admitted 11/12/2014 and discharged 11/14/2014. Came in with shortness of breath and cough, admitted for COPD exacerbation and pneumonia started on Rocephin and Zithromax.   LABORATORY AND RADIOLOGICAL DATA DURING THE HOSPITAL COURSE: Included blood cultures that were negative. Glucose  104, BUN 61, creatinine 8.48, sodium 135, potassium 3.9, chloride 103, CO2 19, calcium 7.3. White blood cell count 5.4, H and H  10.4 and 31.8, platelet count of 133. EKG showed a normal sinus rhythm, left atrial enlargement, right bundle branch block. Chest x-ray showed mild patchy lingular opacity atelectasis versus pneumonia. Stool for C. difficile was negative.   HOSPITAL COURSE PER PROBLEM LIST:  1. For the patient's chronic obstructive pulmonary disease exacerbation, patient was moving better air, less wheeze given a quick prednisone taper. She wanted a nebulizer.  I wrote a script for the nebulizer and nebulizer treatment.  Antibiotics were prescribed upon discharge, also prescribed Flovent and she has Spiriva at home 2. For her pneumonia, she was started on Rocephin and Zithromax. This was switched over to Ceftin and Zithromax to finish up a course. This was listed as possible pneumonia on the chest x-ray.  I will  treat it as such.  3. End-stage renal disease on peritoneal dialysis seen in consultation by Dr. Cherylann RatelLateef, peritoneal dialysis while here in the hospital.  4.  Hypertension. Stable on current medications.  5. Atrial fibrillation. The patient is on metoprolol for rate control, not on any anticoagulation. Can follow up with primary care physician for this as outpatient.   TIME SPENT ON DISCHARGE: 35 minutes    ____________________________ Herschell Dimesichard J. Renae GlossWieting, MD rjw:DT D: 11/14/2014 13:55:50 ET T: 11/14/2014 15:06:15 ET JOB#: [id number]  cc: Herschell Dimesichard J. Renae GlossWieting, MD, <Dictator> Mosetta PigeonHarmeet Singh, MD Salley ScarletICHARD J Keelon Zurn MD ELECTRONICALLY SIGNED  11/15/2014 14:24 

## 2015-04-07 NOTE — Discharge Summary (Signed)
PATIENT NAME:  Dorothy Long, CALK MR#:  [id number] DATE OF BIRTH:  [birth year]  DATE OF ADMISSION:  02/13/2014 DATE OF DISCHARGE:  02/16/2014   For a detailed note, please take a look at the history and physical done on admission by Dr. Randol Kern.   DIAGNOSES AT DISCHARGE: As follows, chronic obstructive pulmonary disease exacerbation secondary to hospital-acquired pneumonia. End-stage renal disease on hemodialysis, Tuesday, Thursday, Saturday. Hypertension. Chronic pain. Tobacco abuse.   The patient is being discharged on a low-sodium, renal diet.   ACTIVITIES:  As tolerated.   Follow up with Dr. Duncan Dull in the next 1 to 2 weeks.   DISCHARGE MEDICATIONS: Tylenol 650 q.4 hours as needed, Dexilant 60 mg daily, Xanax 0.2 mg q.8 hours as needed, Benadryl 1 tab Monday, Wednesday, Friday before dialysis; albuterol inhaler 1 puff q.i.d., amlodipine 5 mg daily, calcium acetate 3 caps t.i.d. with meals, Spiriva 1 puff daily, nicotine transdermal patch 7 mg daily, Augmentin 875/125, 1 tab b.i.d. x 7 days; prednisone taper starting at 50 mg, down to 10 mg over the next 4 days. Tylenol with hydrocodone 1 tab q.6 hours as needed.   CONSULTANTS DURING THE HOSPITAL COURSE: Dr. Heide Spark from nephrology.   PERTINENT STUDIES DONE DOING THE HOSPITAL COURSE: Are as follows: A CT scan of the chest done without contrast on admission showing evidence of underlying emphysema. There are areas of superimposed consolidation most pronounced in the left lower lobe.   A chest x-ray done on admission showing bibasilar lung opacities, which could represent atelectasis or consolidation.   HOSPITAL COURSE: This is a 54 year old female with medical problems as mentioned above, presented to the hospital due to shortness of breath and noted to be in chronic obstructive pulmonary disease exacerbation.  1.  Chronic obstructive pulmonary disease exacerbation. This was likely the cause of the patient's shortness of breath  and cough on admission. The patient was recently discharged from the hospital not too long ago with suspected pneumonia. She was noted to have a left lower lobe infiltrate on admission. This was thought to be nosocomial pneumonia. The patient was started on broad-spectrum IV antibiotics including vancomycin and Zosyn, also started on IV steroids, around-the-clock nebulizer treatments and started on inhalers. The patient has significantly improved with aggressive therapy. She was ambulated on room air. Did not desaturate below 88%, therefore, did not qualify for home oxygen as presently I am discharging her on oral prednisone taper, along with Augmentin for the next 7 days. She was not any inhalers, so I am discharging her on some Spiriva presently.  2.  Pneumonia. This was likely nosocomial pneumonia. This was likely the cause of her COPD exacerbation. The patient was treated in the hospital with aggressive IV antibiotics including vancomycin and Zosyn. Her blood cultures and sputum cultures remained negative. At this time, I am discharging her on some oral Augmentin.  3.  Hypertension. The patient remained hemodynamically stable. She will continue her Norvasc. 4.  Anxiety. The patient was maintained on her Xanax. She will resume that.  5.  End-stage renal disease on hemodialysis. The patient continued her dialysis on Tuesday, Thursday, Saturday. She will resume that schedule. She did have an extra round of dialysis while in the hospital to get some extra fluid removed, which seemed to help her with her respiratory symptoms.  6.  Tobacco abuse. The patient was strongly advised to quit smoking. She was maintained on a nicotine patch. I did give her a prescription for nicotine patches, which she  requested for.  7.  Chronic pain. She had some right shoulder and chest pain, likely this is musculoskeletal in nature. I did obtain a EKG, which showed no acute ST changes. She will be discharged on some p.r.n. Norco for  pain.   The patient is a FULL CODE.   TIME SPENT: 40 minutes.    ____________________________ Rolly PancakeVivek J. Cherlynn KaiserSainani, MD vjs:dmm D: 02/16/2014 15:54:04 ET T: 02/16/2014 21:53:38 ET JOB#: [id number]  cc: Rolly PancakeVivek J. Cherlynn KaiserSainani, MD, <Dictator> Duncan Dulleresa Tullo, MD Houston SirenVIVEK J Cyndee Giammarco MD ELECTRONICALLY SIGNED 02/26/2014 22:35

## 2015-04-07 NOTE — Discharge Summary (Signed)
PATIENT NAME:  Dorothy Long, Dorothy Long MR#:  [id number] DATE OF BIRTH:  12/13/1961  DATE OF ADMISSION:  01/07/2014 DATE OF DISCHARGE:  01/08/2014  DISCHARGE DIAGNOSES: 1.  Musculoskeletal back, chest pain.  2.  Hypertension.  3.  History renal disease, on hemodialysis.  4.  Anemia of chronic disease.  5.  Benign positional vertigo.   IMAGING STUDIES DONE: A chest x-ray which showed very small bibasilar pleural effusions which are improved in repeat chest x-ray with no infiltrate and showed mild vascular congestion which has improved on repeat chest x-ray.   CONSULTANTS: Dr. Cherylann RatelLateef with nephrology.   PROCEDURES DURING THE HOSPITAL STAY: Include a round of hemodialysis.   ADMITTING HISTORY AND PHYSICAL AND HOSPITAL COURSE: Please see detailed H and P dictated previously. In brief, a 54 year old African American female patient with history of end-stage renal disease on hemodialysis, hypertension, paroxysmal atrial fibrillation, presented to the Emergency Room complaining of pain in the back and posterior chest area on the left side. The patient was admitted after having found to have some vascular congestion on her chest x-ray and her pain very similar to previous episode of pneumonia. The patient did not have any infiltrate on the chest x-ray, showed mild small pleural effusions with vascular congestion. Had a round of dialysis with which her symptoms improved. The patient did have an episode of benign positional vertigo during the hospital stay which she has a history of, her last episode being 2 months back. The patient's blood pressure was well controlled. The patient was given meclizine prescription and also pain medications at discharge.   On examination, the patient's lungs sound clear. No abdominal tenderness. Heart sounds show S1, S2, systolic murmur.   DISCHARGE MEDICATIONS: Include:  1.  Acetaminophen 650 mg every 4 hours as needed for pain and fever.  2.  Apixaban 60 mg daily.  3.   Alprazolam 0.5 mg every 8 hours as needed for anxiety.  3.  Ferrous sulfate 325 mg oral once a day.  4.  Albuterol 2 puffs inhaled 4 times a day as needed.  5.  Aspirin 325 mg oral once a day.  6.  Cardizem 240 mg oral once a day.  7.  Benadryl 1 tablet oral on Monday, Wednesday, Friday before dialysis.  8.  Ventolin HFA 1 puff inhaled 4 times a day.  9.  Calcium acetate 667 mg 3 capsules oral 3 times a day.  10.  Amlodipine 5 mg daily.  11.  Acetaminophen/tramadol 325/37.5 one tablet oral 3 times a day as needed.  12.  Nicotine 10 mg transdermal once a day.   DISCHARGE INSTRUCTIONS: Renal diet. Activity as tolerated. Follow up with primary care physician in 1 to 2 weeks. Continue dialysis as before.   Patient was counseled on day of discharge to quit smoking for greater than 3 minutes. She has requested a nicotine patch which I have given a prescription for. The patient has verbalized understanding that she needs to quit to decrease her risk of CVA, stroke and other diseases.   TIME SPENT: On day of discharge in discharge activity was 40 minutes.    ____________________________ Molinda BailiffSrikar R. Chalmer Zheng, MD srs:cs D: 01/08/2014 15:47:17 ET T: 01/08/2014 20:24:11 ET JOB#: [id number]  cc: Wardell HeathSrikar R. Roc Streett, MD, <Dictator> Duncan Dulleresa Tullo, MD Orie FishermanSRIKAR R Beecher Furio MD ELECTRONICALLY SIGNED 01/10/2014 10:21

## 2015-04-07 NOTE — Op Note (Signed)
PATIENT NAME:  Dorothy Long, Dorothy Long MR#:  [id number] DATE OF BIRTH:  10-07-1961  DATE OF PROCEDURE:  03/29/2014  PREOPERATIVE DIAGNOSES: End-stage renal disease requiring hemodialysis.   POSTOPERATIVE DIAGNOSIS:  End-stage renal disease requiring hemodialysis.   PROCEDURE PERFORMED: Laparoscopic-assisted insertion of peritoneal dialysis catheter.   SURGEON: Levora DredgeGregory Cylas Falzone, M.D.   ANESTHESIA: General by endotracheal intubation.   FLUIDS: Per anesthesia record.   ESTIMATED BLOOD LOSS: Minimal.   SPECIMEN: None.   INDICATIONS: [name] is a 54 year old woman, who is currently maintained on hemodialysis, but has decided to switch to peritoneal dialysis. She, therefore, requires peritoneal catheter. The risks and benefits were reviewed. All questions answered. The patient has agreed to proceed.   DESCRIPTION OF PROCEDURE: The patient is taken to the operating room and placed in the supine position. After adequate general anesthesia is induced appropriate invasive monitors are placed, she is positioned supine and prepped and draped from the nipple line down to the groins. Appropriate timeout is called.   A small incision is made vertically above the umbilicus and the dissection is carried down to expose the fascia, 0 Vicryl pursestring suture is placed and subsequently a bone hook is used to elevate the fascia. A Veress needle is introduced. Saline test is verifying intraperitoneal placement, and low flow CO2 insufflation is started. Subsequently high flow is initiated and the peritoneal cavity is insufflated to 15 mmHg pressure.  A 5 mm trocar is then placed.     The camera is introduced and the viscera are inspected. Observed viscera appear normal. Bowels are free of significant adhesions. There does not appear to be any significant inflammation. Omentum is noted in the left lateral gutter. Straw-colored peritoneal fluid is also identified.   Area in the left lower quadrant is then  localized by palpation, with visualization, via the scope and a small incision is made, is carried down to expose the fascia and again an 0 Vicryl pursestring suture is placed. Seldinger needle is introduced under direct visualization and subsequently a J-wire is advanced and guided down into the pelvis. Dilator and peel-away sheath is inserted and subsequently the peritoneal catheter is advanced through the peel-away sheath over a trocar. Is then introduced down into the pelvis and the trocar removed, the peel-away sheath is removed. The cuff closer to the pigtail catheter, i.e. the first cuff is then advanced below the level of the fascia of the rectus, and the pursestring suture secured. The catheter is then tunneled and exits two fingerbreadths below the costal margin in the left upper quadrant after a small incision is made. The hub assembly is then connected and as a check, several 100 mL of fluid are retrieved without difficulty. The Betadine cap is then placed. Before placing the cap, the catheter is flushed with heparinized saline. The subcutaneous tissues are reapproximated with 3-0 Vicryl, and the skin is closed with 4-0 Monocryl, both the supraumbilical incision and the left lower quadrant incision. Dermabond was then applied. Sterile dressing is applied at the exit site. The patient tolerated the procedure well and there were no immediate complications.  ____________________________ Renford DillsGregory G. Lilah Mijangos, MD ggs:dp D: 03/29/2014 11:06:48 ET T: 03/29/2014 11:39:32 ET JOB#: [id number]  cc: Renford DillsGregory G. Mekel Haverstock, MD, <Dictator> Duncan Dulleresa Tullo, MD Dr. Nile RiggsSingh Amontae Ng G Caly Pellum MD ELECTRONICALLY SIGNED 04/04/2014 11:17

## 2015-04-07 NOTE — H&P (Signed)
PATIENT NAME:  Dorothy Long, Verle E MR#:  [id number] DATE OF BIRTH:  1961/10/15  DATE OF ADMISSION:  02/17/2014  PRIMARY CARE PHYSICIAN: Duncan Dulleresa Tullo, MD  PRIMARY NEPHROLOGIST: Mosetta PigeonHarmeet Singh, MD   REFERRING PHYSICIAN: Su Leyobert L. Kinner, MD  CHIEF COMPLAINT: Nausea, vomiting, diarrhea, dizziness.   HISTORY OF PRESENT ILLNESS: The patient is a pleasant 54 year old African American female who was actually just discharged yesterday after presenting with COPD exacerbation secondary to healthcare-associated pneumonia on the 2nd of March. She was discharged on prednisone and Augmentin after improving on broad-spectrum antibiotics. She got discharged and stated at about 1:30 a.m. started to have acute onset nausea, vomiting, and diarrhea. She has had in excess of 15 episodes of diarrhea, which she describes as greenish to brown without any blood or tarry stools. She also states she cannot keep anything down, has been having nausea and vomiting as well. She has been having weakness and dizziness and came into the hospital, where she was found to have significant tachycardia with ambulation, rate of 150s, which happened actually twice. She appears to be dehydrated. Hospitalist services were contacted for further evaluation and management.   PAST MEDICAL HISTORY: End-stage renal disease, on dialysis Tuesday, Thursday, Saturday, last dialysis yesterday; hypertension; paroxysmal A. fib, not on anticoagulation; CHF; COPD; chronic anemia.   PAST SURGICAL HISTORY: Left Port-A-Cath placement, left forearm AV fistula placement.  ALLERGIES: CIPROFLOXACIN, DEMEROL, FLAGYL, FLUCONAZOLE, MORPHINE, TETRACYCLINE, AND VALIUM.   SOCIAL HISTORY: Lives at home with family. She said she is not smoking any longer since last week. No alcohol or drug use.   FAMILY HISTORY: Hypertension, CAD.   OUTPATIENT MEDICATIONS: Acetaminophen 650 every 4 hours as needed for pain, acetaminophen/hydrocodone 325/5 mg every 6 hours as needed,  alprazolam 0.25 mg every 8 hours as needed for anxiety, amlodipine 5 mg daily, Augmentin 875/125 one tab every 12 hours for 7 days, Benadryl 1 tab before dialysis, calcium acetate 667 mg 3 caps 3 times a day with meals, Dexilant 60 mg in the mornings, nicotine patch once a day, prednisone taper, tiotropium 18 mcg inhaled 1 cap once a day, Ventolin p.r.n.   REVIEW OF SYSTEMS:    CONSTITUTIONAL: Positive for chills, headaches, and dizziness. Denies weight loss or weight gain.  EYES: Some chronic blurry vision. Denies double vision.  ENT: No tinnitus or hearing loss.  RESPIRATORY: Cough and wheezing, dyspnea on exertion, but that is getting better.  CARDIOVASCULAR: No chest pain. Had palpitations when standing up.  GASTROINTESTINAL: Positive for nausea, vomiting, diarrhea as above. Has abdominal pain around her navel, crampy, without radiation, when she is having diarrhea.  GENITOURINARY: Denies dysuria or hematuria.  HEMATOLOGY AND LYMPHATIC: Denies anemia or easy bruising.  SKIN: No rashes.  MUSCULOSKELETAL: Has some back pain, which is chronic.  NEUROLOGIC: No focal weakness or numbness. Has global weakness.  PSYCHIATRIC: Denies depression or anxiety currently. She has a history of anxiety.   PHYSICAL EXAMINATION: VITAL SIGNS: Temperature on arrival 99.1, pulse rate 110 (on standing, it goes up to 150s), respiratory rate 20, blood pressure 141/97, O2 sat 95% on room air.  GENERAL: The patient is a thin female, sitting in bed in no obvious distress, talking in full sentences.  HEENT: Normocephalic, atraumatic. Pupils are equal and reactive. Dry mucous membranes.  NECK: Supple. No thyroid tenderness. No cervical lymphadenopathy.  CARDIOVASCULAR: S1, S2, tachycardic. No significant murmurs.  LUNGS: At bases, the patient has decreased breath sounds, but no significant wheezing or rhonchi.  ABDOMEN: Soft, nontender, hyperactive. No rebound  or guarding.  EXTREMITIES: Does not exhibit any pitting  edema.  SKIN: No obvious rashes or lesions.  NEUROLOGIC: Cranial nerves II through XII grossly intact. Strength is 5 out of 5 in all extremities. Sensation is intact to light touch.  PSYCHIATRIC: Awake, alert, oriented x 3.   LABORATORY, DIAGNOSTIC, AND RADIOLOGIC DATA: X-ray of the chest, PA and lateral, showing continued bilateral pleural effusions with bibasilar collapse/atelectasis, cardiomegaly without overt edema. White count of 7.1, hemoglobin 11 (of note, it was 9.5 on March 3); platelets are 171. LFTs showed albumin of 3 and bilirubin of 1.2, otherwise within normal limits. Lipase is within normal limits. BUN 19, creatinine 3.59 (of note, creatinine was 6.6 on March 3), sodium 138, potassium 3.8. EKG: Rate of 154, some nonspecific T wave changes, sinus.   ASSESSMENT AND PLAN: We have a 54 year old female with end-stage renal disease,  dialysis, was just discharged yesterday for chronic obstructive pulmonary disease exacerbation secondary to nosocomial pneumonia, discharged on a prednisone taper and Augmentin, who states had 3 consecutive dates of dialysis ending yesterday, who presents with acute onset nausea, vomiting, diarrhea, dizziness, tachycardia. Would admit the patient for observation. I am suspecting Clostridium difficile given the broad-spectrum antibiotics. However, she could also be having a viral gastroenteritis type of a picture. However, Clostridium difficile should be ruled out, as she will need antibiotic for her pneumonia for another 7 days. Stool cultures including Clostridium difficile have been ordered. I would symptomatically treat the nausea, vomiting with Zofran. I would start her on some gentle fluids and put a limit time of about 20 hours at 40 mL/hour, as she is symptomatic from the gastrointestinal losses. She appears to be somewhat dehydrated as well on exam. Would continue the antibiotics for now and treat the Clostridium difficile, if the Clostridium difficile is  positive. Would obtain nephrology consult for resumption of dialysis. Start her on clears and a nicotine patch. Would start her on heparin for deep vein thrombosis prophylaxis.   TOTAL TIME SPENT: 40 minutes.    ____________________________ Krystal Eaton, MD sa:jcm D: 02/17/2014 16:18:02 ET T: 02/17/2014 16:54:17 ET JOB#: [id number]  cc: Krystal Eaton, MD, <Dictator> Duncan Dull, MD Mosetta Pigeon, MD Krystal Eaton MD ELECTRONICALLY SIGNED 03/16/2014 15:36

## 2015-04-07 NOTE — Op Note (Signed)
PATIENT NAME:  Dorothy Long, Dorothy Long MR#:  [id number] DATE OF BIRTH:  [birth year]  DATE OF PROCEDURE:  01/16/2014  PREOPERATIVE DIAGNOSIS: End-stage renal disease with functional permanent dialysis access and no longer needing PermCath.   POSTOPERATIVE DIAGNOSIS: End-stage renal disease with functional permanent dialysis access and no longer needing PermCath.   PROCEDURE: Removal of right jugular PermCath.   SURGEON: [place] Alease Fait, [address]   ANESTHESIA: Local.   ESTIMATED BLOOD LOSS: Minimal.   INDICATION FOR THE PROCEDURE: The patient is a 54 year old female with end-stage renal disease. Her access is functional and she no longer needs the PermCath. This will be removed.   DESCRIPTION OF THE PROCEDURE: The patient is brought to the vascular interventional radiology area and positioned supine. The right neck and chest and existing catheter were sterilely prepped and draped and a sterile surgical field was created. The area was locally anesthetized copiously with 1% lidocaine. Hemostats were used to help dissect out the cuff. An 11 blade was used to transect the fibrous sheath connected to the cuff. The catheter was then removed in its entirety without difficulty with gentle traction. Pressure was held at the base of the neck. Sterile dressing was placed. The patient tolerated the procedure well. No complications.   ____________________________ Hoyle Sauerhelsea N. Jordis Repetto, PA-C cnh:aw D: 01/16/2014 09:01:47 ET T: 01/16/2014 11:13:35 ET JOB#: [id number]  cc: Zadok Holaway N. Liylah Najarro, PA-C, <Dictator> Charae Depaolis N Mahari Strahm PA ELECTRONICALLY SIGNED 01/18/2014 10:03

## 2015-04-07 NOTE — H&P (Signed)
PATIENT NAME:  Dorothy Long, Dorothy Long MR#:  [id number] DATE OF BIRTH:  17-Feb-1961  DATE OF ADMISSION:  01/07/2014  CHIEF COMPLAINT:  Left lower pleuritic posterior chest pain.   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  She mentions that this pain is very similar to the pneumonia she had in the past.  The patient was found to have mild bilateral pulmonary edema and was admitted after she missed dialysis for inpatient dialysis.  She has mild shortness of breath.  The chest pain is pleuritic on moving or taking a deep breath.  She mentions that she cannot take a deep breath.  No lightheadedness, syncope, palpitations.  No recent travel, immobility.  No history of DVT, PE.   She is afebrile, normal white count and saturating 97% on room air.   PAST MEDICAL HISTORY: 1.  Hypertension.  2.  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  3.  COPD.  4.  Anemia of chronic disease.  5.  Tobacco abuse.   SOCIAL HISTORY:  The patient continues to smoke.  No alcohol.  No illicit drugs.  Is married, lives with her husband.   FAMILY HISTORY:  Hypertension and heart disease.   ALLERGIES:  CIPRO, FLOXACIN, DEMEROL, FLAGYL, FLUCONAZOLE, MORPHINE, TETRACYCLINE, VALIUM.    HOME MEDICATIONS:  Include:  1.  Amlodipine 10 mg daily.  2.  Acetaminophen oxycodone 325/5 1 tablet every six hours as needed.  3.  Albuterol 2 puffs inhaled 4 times a day as needed.  4.  Aspirin 325 mg daily.  5.  Benadryl 1 tablet oral every other day Monday, Wednesday, Friday before dialysis.  6.  Dexilant 60 mg oral daily.  7.  Ferrous sulfate 325 mg daily.  8.  Fentanyl HFA inhaled 4 times a day.   REVIEW OF SYSTEMS:  CONSTITUTIONAL:  Complains of some fatigue.  EYES:  No  blurred vision, pain or redness.  EARS, NOSE, THROAT:  No tinnitus, ear pain, hearing loss. RESPIRATORY:  Has the pleuritic chest pain, some shortness of breath.  CARDIOVASCULAR:  No orthopnea, edema.  GASTROINTESTINAL:  No nausea, vomiting, diarrhea, abdominal pain.  GENITOURINARY:  No dysuria, hematuria, frequency.  ENDOCRINE:  No polyuria, nocturia, thyroid problems.  HEMATOLOGIC AND LYMPHATIC:  No anemia, easy bruising, bleeding.  INTEGUMENTARY:  No acne, rash, lesion.  MUSCULOSKELETAL:  No back pain, arthritis.  NEUROLOGIC:  No focal numbness, weakness.  PSYCHIATRIC:  No anxiety or depression.   PHYSICAL EXAMINATION: VITAL SIGNS:  Temperature 97.8, pulse of 83, blood pressure 147/107, saturating 97% on room air.  GENERAL:  Frail, African American female patient sitting up in bed, seems comfortable.  PSYCHIATRIC:  Alert, oriented x 3.  Mood and affect appropriate.  Judgment intact.  HEENT:  Atraumatic, normocephalic.  Oral mucosa moist and pink.  Pallor positive.  No icterus.  Pupils bilaterally equal and react to light.  NECK:  Supple.  No thyromegaly.  No palpable lymph nodes.  Trachea midline.  No carotid bruit, JVD.  CARDIOVASCULAR:  S1, S2, systolic murmur.  Peripheral pulses 2+.  No edema.  RESPIRATORY:  Normal work of breathing.  Decreased air entry at the bases.  GASTROINTESTINAL:  Soft abdomen, nontender.  Bowel sounds present.  No hepatosplenomegaly palpable.  GENITOURINARY:  No CVA tenderness or bladder distention.  SKIN:  Warm and dry.  No petechiae, rash, ulcers.  MUSCULOSKELETAL:  No joint swelling, redness, effusion of the large joints.  Normal muscle tone.  NEUROLOGICAL:  Motor strength 5 by 5 in upper and lower extremities.   LABORATORY DATA:  Glucose of 126, BNP of 27,000.  BUN 25, creatinine 5.86, sodium 129, potassium 3.8, troponin 0.06, 0.07 and 0.08.  WBC 5.9, hemoglobin 10.6, platelets of 198.   Chest x-ray shows small bilateral pleural effusions with mild  bibasilar air space opacities.  Mild vascular congestion and mild cardiomegaly, concern for mild pulmonary edema and findings of COPD.   ASSESSMENT AND PLAN: 1.  Left posterior chest pain, pleuritic, with no risk factors for pulmonary embolism.  The patient is not tachycardic, not hypoxic.  Cardiac enzymes have essentially been normal.  She mentions that this pain is similar to the pneumonia she developed in the past, but she is afebrile, saturating well on room air and white count is normal.  We will not start any antibiotics at this time.  I suspect this is secondary to her chronic obstructive pulmonary disease as she continues to smoke.  We will use as needed pain medications.  Repeat a chest x-ray in the morning.  She does have small pleural effusions at the bases which can cause some pain.  We will observe the patient over 24 hours.  If she is afebrile and feeling better she can be discharged home tomorrow.  May need another round of dialysis.  2.  Hypertension, uncontrolled.  Start the patient on her home dose of Norvasc and use as needed medications.  3.  End-stage renal disease on hemodialysis.  The patient had a round of dialysis today.  Consulted Dr. Cherylann Ratel.  4.  Chronic hyponatremia, stable.  5.  Deep vein thrombosis prophylaxis with heparin.   CODE STATUS:  FULL CODE.  Time spent today on this case was 50 minutes.    ____________________________ Molinda Bailiff Atharva Mirsky, MD srs:ea D: 01/07/2014 16:52:52 ET T: 01/07/2014 17:38:54 ET JOB#: [id number]  cc: Wardell Heath R. Tyron Manetta, MD, <Dictator> Dr. Elson Areas MD ELECTRONICALLY SIGNED 01/10/2014 10:21

## 2015-04-07 NOTE — Discharge Summary (Signed)
PATIENT NAME:  Dorothy Long, [name] MR#:  [id number] DATE OF BIRTH:  09/07/1961  DATE OF ADMISSION:  02/17/2014 DATE OF DISCHARGE:  02/19/2014  ADMISSION DIAGNOSIS:  Nausea, vomiting and diarrhea.   DISCHARGE DIAGNOSES: 1.  Viral gastroenteritis causing nausea, vomiting, diarrhea.  2.  End-stage renal disease, on hemodialysis.  3.  Recent chronic obstructive pulmonary disease exacerbation.  4.  Hypertension.   CONSULTATIONS:  Dr. Wynelle LinkKolluru.   LABORATORY DATA AT DISCHARGE:  White blood cells 5.4, hemoglobin 9.5, hematocrit 29, platelets are 138, sodium 135, potassium 3.8, chloride 100, bicarb 26, BUN 21, creatinine 4.19, glucose is 78.   HOSPITAL COURSE:  A very pleasant 54 year old female who is discharged from the hospital with COPD exacerbation and who presented shortly after with nausea, vomiting, diarrhea.  For further details, please refer to the H and P.  1.  Gastroenteritis.  The patient was admitted for nausea, vomiting, diarrhea.  Her C. difficile was negative.  Her symptoms actually subsided.  She no longer has nausea, vomiting and diarrhea.  Seems like this is from viral gastroenteritis.  She tolerated her diet.  2.  End-stage renal disease.  The patient was on hemodialysis as per her normal schedule.  3.  Recent COPD/bronchitis flare.  The patient was continued on her outpatient medications.  4.  History of CHF which is stable.  5.  Tobacco abuse:  The patient was counseled and was discharged with a nicotine patch.   DISCHARGE MEDICATIONS: 1.  Tylenol 325, 2 tablets q. 4 hours as needed pain.  2.  Dexilant 60 mg in the morning.  3.  Xanax 0.25 mg three times daily as needed.  4.  Benadryl 1 tablet Monday, Wednesday, Friday before dialysis.  5.  Ventolin HFA 1 puff 4 times a day as needed.  6.  Norvasc 5 mg daily.  7.  Calcium acetate 667 mg 3 tablets three times daily.  8.  Tiotropium 18 mcg daily.  9.  Nicotine patch 7 mg per 24 hours one patch daily.  10.  Amoxicillin  clavulanate 875/125 q. 12 hours x 7 days.  This was actually prescribed to her on her recent discharge which she will continue.  11.  Prednisone taper.  She will continue with her prednisone taper per her recent discharge.  12.  Acetaminophen hydrocodone 325/5 q. 6 hours as needed.  13.  Zofran ODT one tablet three times daily as needed nausea.  14.  Diamide 2 mg q. 6 hours as needed diarrhea.   DISCHARGE DIET:  Renal diet.   DISCHARGE ACTIVITY:  As tolerated.   DISCHARGE FOLLOW-UP:  The patient will follow up in one week with Dr. Darrick Huntsmanullo.  The patient is medically stable for discharge.    ____________________________ Mak Bonny P. Juliene PinaMody, MD spm:ea D: 02/19/2014 11:47:00 ET T: 02/19/2014 20:02:17 ET JOB#: [id number]  cc: Calieb Lichtman P. Juliene PinaMody, MD, <Dictator> Duncan Dulleresa Tullo, MD Janyth ContesSITAL P Melodee Lupe MD ELECTRONICALLY SIGNED 02/20/2014 14:15

## 2015-04-07 NOTE — H&P (Signed)
PATIENT NAME:  Dorothy Long, Dorothy Long MR#:  [id number] DATE OF BIRTH:  [birth year]  DATE OF ADMISSION:  01/18/2014  PRIMARY CARE PHYSICIAN:  Dr. Darrick Huntsman.   REFERRING EMERGENCY ROOM PHYSICIAN:  Dr. Carollee Massed.   CHIEF COMPLAINT:  Sharp pain all over the chest radiating to the back associated with cough for the past three days.   HISTORY OF PRESENT ILLNESS:  The patient is a 54 year old female with a past medical history of end-stage renal disease on hemodialysis on Tuesday, Thursday and Saturday, congestive heart failure, COPD, paroxysmal atrial fibrillation not on any anticoagulation, is presenting to the ER with a chief complaint of three day history of productive cough with yellowish phlegm and sharp, pleuritic chest pain in the chest radiating to the left side of the back.  Denies any dizziness, loss of consciousness.  She is running low-grade temperature of 99 degrees Fahrenheit.  The patient used to have a left-sided Port-A-Cath for her dialysis which was pulled out on last Monday.  Her last dialysis was yesterday and next dialysis is due tomorrow.  Denies any abdominal pain, nausea or vomiting.  The patient was given Dilaudid in the ER, following that the patient felt nauseous.  During my examination, she is reporting that she is feeling weak and tired and wants to rest.  Husband is at bedside.  No other complaints.   PAST MEDICAL HISTORY:  1.  End-stage renal disease on hemodialysis for the past eight months on Tuesday, Thursday and Saturday.  2.  Hypertension.  3.  Paroxysmal atrial fibrillation, not on any anticoagulation.  4.  Congestive heart failure.  5.  COPD.  6.  Anemia of chronic disease.   PAST SURGICAL HISTORY:  Left Port-A-Cath placement and removal on last Monday.  Left forearm AV fistula placement.   ALLERGIES:  CIPRO, FLOXACIN, DEMEROL, FLAGYL, FLUCONAZOLE, MORPHINE, TETRACYCLINE AND VALIUM.   PSYCHOSOCIAL HISTORY:  Lives at home, lives with husband.  Continues to smoke half pack  a day.  Denies alcohol or illicit drug usage.   FAMILY HISTORY:  Hypertension, heart disease runs in her family.    HOME MEDICATIONS:  Ventolin 1 puff inhalation 4 times a day, Dexilant 60 one capsule by mouth once daily, calcium is 2 to 3 capsules by mouth 3 times a day, Benadryl 1 tablet by mouth every other day, Monday, Wednesday, Friday before dialysis, amlodipine 5 mg once daily, alprazolam 0.25 mg by mouth q. 8 hours, Tylenol 325 mg 2 tablets by mouth every four hours.    REVIEW OF SYSTEMS: CONSTITUTIONAL:  Denies any fatigue.  Complaining of low-grade fever.  EYES:  Denies blurry vision, double vision.  EARS, NOSE, THROAT:  Denies epistaxis or discharge.  RESPIRATION:  Complaining of productive cough for the past three days, has history of COPD.  CARDIOVASCULAR:  Complaining of sharp, pleuritic chest pain radiating to the back on the left side.  Denies palpitation.  GASTROINTESTINAL:  Complaining of nausea.  Denies vomiting, diarrhea.  No abdominal pain.  GENITOURINARY:  No dysuria or hematuria.  GYNECOLOGIC AND BREAST:  Denies breast mass or vaginal discharge.  ENDOCRINE:  Denies polyuria, nocturia, thyroid problems.  INTEGUMENTARY:  No acne, rash, lesions.  HEMATOLOGIC AND LYMPHATIC:  Chronic anemia is present.  No bleeding.  MUSCULOSKELETAL:  No joint pain in the neck and back.  Denies gout.  NEUROLOGIC:  No vertigo or ataxia.  PSYCHIATRIC:  No ADD, OCD.   PHYSICAL EXAMINATION: VITAL SIGNS:  Temperature 99.2, pulse 103, respirations 20, blood pressure 126/80 and  pulse ox 93% on 2 liters.  GENERAL APPEARANCE:  Not under acute distress, thin and emaciated female.  HEENT:  Normocephalic, atraumatic.  Pupils are equally reacting to light and accommodation.  No scleral icterus.  No conjunctival injection.  No sinus tenderness.  Moist mucous membranes.  NECK:  Supple.  No JVD.  No thyromegaly.  LUNGS:  Diminished breath sounds at the left lower lung.  Moderate air entry on the left  upper lobe and the right side of the lung.  No wheezing.  Minimal crackles and rales at the left lower base.  CARDIOVASCULAR:  Irregularly irregular.  No anterior chest wall tenderness on palpation.  No peripheral edema.  GASTROINTESTINAL:  Soft.  Bowel sounds are positive in all four quadrants.  Nontender, nondistended.  No hepatosplenomegaly.  No masses felt.  NEUROLOGIC:  Awake, alert, oriented x 3.  Motor and sensory grossly intact.  Cranial nerves II through XII are intact.  Reflexes are 2+.  EXTREMITIES:  No edema.  No cyanosis.  No clubbing.  SKIN:  Warm to touch.  Normal turgor.  No rashes.  No lesions.  MUSCULOSKELETAL:  No joint effusion, tenderness, erythema.  PSYCHIATRIC:  Normal mood and affect.   LABORATORIES AND IMAGING STUDIES:  Chest x-ray PA and lateral view:  New left basilar air space disease and  small effusion is worrisome for pneumonia.  No change in the small right effusion and mild basilar atelectasis.  Glucose 104, BUN 25, creatinine 5.13, sodium 128, potassium 4.0, chloride 95, CO2 24, GFR 11, serum osmolality 262, calcium 8.8.  LFTs are normal except albumin which is low at 2.8.  Troponin 0.04.  WBC 10.1, hemoglobin 9.7, hematocrit 29.2, platelets are 187.  A 12-lead EKG has revealed sinus tachycardia at 107 beats per minute, normal PR and QRS interval.  Left axis deviation.  PVCs were noticed with fusion complexes, no acute ST-T wave changes.   ASSESSMENT AND PLAN:  A 54 year old female presenting to the ER with a chief complaint of sharp chest pain all over the chest radiating to the left backside associated with productive cough for the past three days will be admitted with the following assessment and plan:  1.  Left lower lobe pneumonia with pleuritic chest pain.  We will admit her to tele bed.  We will provide her IV Rocephin and Zithromax.  Tylenol as needed for mild to moderate pain and morphine IV as needed for severe pain.  2.  End-stage renal disease.  We will  continue hemodialysis on Tuesday, Thursday and Saturday.  Nephrology consult is placed to Dr. Cherylann Ratel.  3.  Chronic hyponatremia.  We will continue close monitoring of the sodium.  4.  Moderate malnutrition, albumin is at 2.8.  We will check prealbumin.  5.  Chronic anemia, probably from end-stage renal disease.  Continue monitoring hemoglobin.  6.  Chronic obstructive pulmonary disease, stable at this time.  We will provide her inhalers as-needed basis.  7.  Paroxysmal atrial fibrillation, rate controlled, not on any anticoagulation.  8.  Hypertension.  Continue home medication amlodipine, blood pressure is stable at this time.  9.  We will provide her gastrointestinal and deep vein thrombosis prophylaxis with Protonix and heparin subQ.  10.  CODE STATUS:  SHE IS FULL CODE.  Husband is the medical power of attorney.    Diagnosis and plan of care was discussed in detail with the patient.  She is aware of the plan.   Total time spent on the admission is  45 minutes.     ____________________________ Ramonita LabAruna Spruha Weight, MD ag:ea D: 01/19/2014 00:15:00 ET T: 01/19/2014 00:50:27 ET JOB#: [id number]  cc: Duncan Dulleresa Tullo, MD Ramonita LabAruna Oryon Gary, MD, <Dictator>   Ramonita LabARUNA Demontez Novack MD ELECTRONICALLY SIGNED 02/03/2014 1:03

## 2015-04-07 NOTE — Discharge Summary (Signed)
PATIENT NAME:  Dorothy Long, Luberta E MR#:  [id number] DATE OF BIRTH:  12-Jun-1961  DATE OF ADMISSION:  01/18/2014 DATE OF DISCHARGE:  01/22/2014  PRIMARY CARE PHYSICIAN:  Duncan Dulleresa Tullo, MD  DISCHARGE DIAGNOSES: 1.  Healthcare-associated pneumonia.  2.  End-stage renal disease, on hemodialysis.  3.  Chronic obstructive pulmonary disease.  4.  Moderate malnutrition.  5.  Paroxysmal atrial fibrillation.  6.  Hypertension.    DISCHARGE MEDICATIONS: 1.  Tylenol 325 mg 2 tablets every 4 hours as needed for pain.  2.  Dexilant 60 mg p.o. daily.  3.  Xanax 0.25 mg every 8 hours as needed for anxiety.  4.  Ventolin 90 mcg 1 puff 4 times a day.  5.  Amlodipine 5 mg p.o. daily.  6.  Augmentin 1 tablet p.o. b.i.d.  7.  Zithromax 500 mg p.o. daily.  Given Augmentin for 10 days and also Zithromax given for 4 days.   CONSULTATIONS: Nephrology consult with Dr. Heide SparkMunson Lateef.   HOSPITAL COURSE:  1.  Pneumonia. The patient is a  54 year old female patient admitted because of chest pain with cough. Look at the history and physical for full details. The patient also had a low-grade temperature and chest pain and cough. She also had generalized weakness. The patient found to have left lower lobe pneumonia, started on IV Rocephin and Zithromax. On admission, the patient's sodium was 128. White count was 10.1. The patient's chest x-ray showed new left base airspace disease. On Rocephin and Zithromax the patient did not feel much better with significant wheezing and shortness of breath. The patient's antibiotics are changed to Vanco and Zosyn by Dr. Auburn BilberryShreyang Patel and the patient's symptoms nicely improved with Vanco and Zosyn. She felt much better. The patient did not have any shortness of breath or cough. The patient's CT chest was done to evaluate for pulmonary emboli which did not show any PE. The patient's blood cultures have been negative. Her white count stayed within normal limits around 10. The patient  discharged home with Augmentin and Zithromax. She did receive 3 days of IV Vanco and Zosyn. Discharged with Augmentin and Zithromax. At the time of discharge, the patient felt much better and wanted to go home. Discharge vitals are heart rate of 92, blood pressure 105/72, sats 96% on room air and temperature was 98.  2.  Hyponatremia, sodium of 125. The patient's initial sodium was 130 on February 5th but dropped to 128 on February 6th, then 125 on February 7th. The patient received hemodialysis and I repeated the sodium yesterday before discharge, sodium improved to 131.  3.  ESRD, on hemodialysis. Last dialysis was on Saturday. She will have her routine dialysis tomorrow.  4.  Hypertension. The patient is on Norvasc, advised her to restart that.   5.  Hypomagnesemia which was replaced.    TIME SPENT ON DISCHARGE PREPARATION: More than 30 minutes.   ____________________________ Katha HammingSnehalatha Zalyn Amend, MD sk:cs D: 01/23/2014 12:40:41 ET T: 01/23/2014 15:46:17 ET JOB#: [id number]  cc: Katha HammingSnehalatha Staley Lunz, MD, <Dictator> Katha HammingSNEHALATHA Akire Rennert MD ELECTRONICALLY SIGNED 01/24/2014 16:00

## 2015-04-07 NOTE — H&P (Signed)
PATIENT NAME:  Dorothy Long, Dorothy R MR#:  [id number] DATE OF BIRTH:  September 27, 1961  DATE OF ADMISSION:  11/12/2014  PRIMARY CARE PHYSICIAN: Dr. Duncan Dulleresa Tullo.   NEPHROLOGIST: Dr. Thedore MinsSingh.   CHIEF COMPLAINT: Shortness of breath and cough.   HISTORY OF PRESENT ILLNESS: This is a 54 year old female, who presents to the hospital with a 3 to 4 day history of shortness of breath and cough, progressively getting worse. The patient was seen by Dr. Darrick Huntsmanullo on Monday, started on a prednisone taper and Levaquin. She apparently had an ALLERGIC REACTION TO LEVAQUIN, which she stopped on Tuesday, but she has finished her prednisone taper. Despite taking that, her shortness of breath and cough and wheezing have not improved. She came to the ER for further evaluation and was noted to be bronchospastic. Was given nebulizer treatments and given antibiotics, but continues to feel ill. Hospitalist services were contact for further treatment and evaluation.   REVIEW OF SYSTEMS:   CONSTITUTIONAL: No documented fever. No weight gain, no weight loss.  EYES: No blurred or double vision.  ENT: No tinnitus. No postnasal drip. No redness of the oropharynx.  RESPIRATORY: Positive cough. Positive wheeze. Positive COPD.  CARDIOVASCULAR: Positive chest tightness due to the COPD. No acute chest pain, no orthopnea, no palpitation, no syncope.  GASTROINTESTINAL: No nausea. No vomiting, no diarrhea, no abdominal pain. No melena or hematochezia.  GENITOURINARY: No dysuria or hematuria.  ENDOCRINE: No polyuria or nocturia. No heat or cold intolerance.  HEMATOLOGIC: No anemia, no bruising, no bleeding.  INTEGUMENTARY: No rashes. No lesions.  MUSCULOSKELETAL: No arthritis, no swelling, no gout.  NEUROLOGIC: No numbness or tingling. No ataxia. No seizure-type activity.  PSYCHIATRIC: Positive anxiety, no insomnia. No ADD.   PAST MEDICAL HISTORY: Is consistent with end-stage renal disease on peritoneal dialysis, hypertension, COPD with  ongoing tobacco abuse, anxiety, secondary hyperparathyroidism.   ALLERGIES: ARE TO CIPRO, DEMEROL, FLAGYL, FLUCONAZOLE, LEVAQUIN, MORPHINE, TETRACYCLINE AND VALIUM.   SOCIAL HISTORY: Still smokes about half a pack per day, has been smoking for the past 30-plus years. No alcohol abuse. No illicit drug abuse. Lives with her husband.   FAMILY HISTORY: Mother is deceased, died from colon cancer. Father had an MI.   CURRENT MEDICATIONS: Are as follows: Tylenol 650 q.6 h. as needed, Xanax 0.25 mg t.i.d. as needed, amoxicillin 500 mg t.i.d. Benadryl 25 mg 3 times weekly on Monday, Wednesday, Friday before dialysis. Calcium acetate 667 mg capsule 3 capsules t.i.d. with meals, cinacalcet 30 mg daily, Flonase 2 sprays to each nostril daily, Lasix 80 mg 1/2 tablet b.i.d. as needed, lactulose 20 grams q.6 h. as needed for constipation, metolazone 5 mg daily as needed, metoprolol succinate 25 mg daily, Norco 5/325 one tablet q.6 h. as needed, Spiriva 1 puff daily, albuterol inhaler 1 puff 4 times daily as needed for shortness of breath.   PHYSICAL EXAMINATION: Presently is as follows:  VITAL SIGNS: The patient's vital signs are noted to be temperature is 98.7, pulse 108, respirations 18, blood pressure 112/74, saturations are 95% on room air.  GENERAL: She is a pleasant-appearing female in mild respiratory distress.  HEENT: Atraumatic, normocephalic. Extraocular muscles are intact. Pupils are reactive to light. Sclerae anicteric. No conjunctival injection. No pharyngeal erythema.  NECK: Supple. There is no jugular venous distention. No bruits, no lymphadenopathy or thyromegaly.  HEART: Regular rate and rhythm. No murmurs, no rubs, no clicks.  LUNGS: She has coarse rhonchi and wheezing diffusely. Negative use of accessory muscles. No dullness to percussion.  ABDOMEN: Soft, flat, nontender, nondistended. Has good bowel sounds. No hepatosplenomegaly appreciated. Positive peritoneal dialysis catheter noted.   EXTREMITIES: No evidence of any cyanosis, clubbing or peripheral edema. Has +2 pedal and radial pulses bilaterally.  NEUROLOGICAL: The patient is alert, awake and oriented x 3 with no focal motor or sensory deficits appreciated bilaterally.  SKIN: Moist and warm with no rashes appreciated.  LYMPHATIC: There is no cervical or axillary lymphadenopathy.   LABORATORY DATA: Showed a serum glucose of 104, BUN 61, creatinine 8.4, sodium 135, potassium 3.9, chloride 103, bicarbonate 19. The patient's white cell count is 5.4, hemoglobin 10.4, hematocrit 31.8, platelet count 133,000.   The patient did have a chest x-ray done, which showed a patchy lingular opacity, atelectasis versus pneumonia.   ASSESSMENT AND PLAN: This is a 54 year old female with history of end-stage renal disease on peritoneal dialysis, hypertension, history of chronic obstructive pulmonary disease with ongoing tobacco abuse, anxiety, secondary hyperparathyroidism, who presents to the hospital with shortness of breath and cough not improving, noted to be in chronic obstructive pulmonary disease exacerbation.   1. Chronic obstructive pulmonary disease exacerbation. This is likely secondary to pneumonia/acute bronchitis. The patient has failed outpatient therapy with prednisone and Levaquin. I will admit the patient and start the patient on IV steroids, around-the-clock nebulizer treatments, continue her Spiriva, add ceftriaxone and Zithromax for the pneumonia/bronchitis. The patient needs to be assessed for home oxygen prior to discharge.  2. Pneumonia/acute bronchitis. I will give her IV ceftriaxone and Zithromax, follow blood and sputum cultures.  3. End-stage renal disease on peritoneal dialysis. We will get a nephrology consult. The patient is known to Dr. Thedore Mins. 4. Anxiety. Continue Xanax.  5. Hypertension. Continue Toprol.  6. Secondary hyperparathyroidism. Continue her Sensipar and calcium acetate.  7. Nicotine dependence. Place  her on a nicotine patch.   CODE STATUS: The patient is a Full Code.   TIME SPENT ON ADMISSION: 50 minutes.    ____________________________ Rolly Pancake. Cherlynn Kaiser, MD vjs:JT D: 11/12/2014 12:25:14 ET T: 11/12/2014 12:53:07 ET JOB#: [id number]  cc: Rolly Pancake. Cherlynn Kaiser, MD, <Dictator> Houston Siren MD ELECTRONICALLY SIGNED 11/18/2014 15:56

## 2015-04-15 NOTE — Discharge Summary (Signed)
PATIENT NAME:  Dorothy Long, Dorothy Long MR#:  [id number] DATE OF BIRTH:  [birth year]  DATE OF ADMISSION:  02/09/2015 DATE OF DISCHARGE:  02/10/2015  ADMITTING COMPLAINT: Shortness of breath with cough.   DISCHARGE DIAGNOSES:  1.  Chronic obstructive pulmonary disease exacerbation.  2.  Community-acquired pneumonia.  3.  End-stage renal disease on peritoneal dialysis.  4.  Hypertension.  5.  Mildly elevated troponin without acute coronary syndrome.   CONSULTATIONS: None.   PROCEDURES:  1.  CT angiography of the chest is negative for acute pulmonary embolism or thoracic aortic dissection. There is 4 chamber marked cardiac enlargement. There is a small right plural effusion and perihepatic ascites. 2.  Chest x-ray shows ill-defined patchy right basilar opacity, question small right pleural effusion. Cardiomegaly question progressed versus difference in technique.   HISTORY OF PRESENT ILLNESS: This 54 year old woman with a past medical history of hypertension, end-stage renal disease on peritoneal dialysis, chronic obstructive pulmonary disease, and history of atrial fibrillation, presents to the Emergency Room with complaint of ongoing shortness of breath for 3 days. She states that she has had cough productive of sputum, shortness of breath and wheezing which has gradually worsened. In the Emergency Room, she was found to have slight respiratory distress suggestive of chronic obstructive pulmonary disease exacerbation. Hospitalist services were asked to admit for further evaluation and treatment.  HOSPITAL COURSE BY PROBLEM:  1.  Chronic obstructive pulmonary disease exacerbation: Likely due to acute bronchitis.  CT angiography negative for pulmonary embolism or pneumonia. She was admitted and treated with duo nebulizers, IV antibiotics. Blood cultures negative. She is discharged on azithromycin, prednisone and will continue with her home albuterol and DuoNeb. At the time of discharge, she is breathing  comfortably with no further wheezing on room air with oxygen saturations in the high 90s.  2.  End-stage renal disease on peritoneal dialysis. Peritoneal dialysis was attempted while in hospital, but unfortunately the peritoneal dialysis unit seems not to function properly. Labs were not significantly altered and the patient will resume her home peritoneal dialysis once she is discharged.  3.  Chronic combined systolic and diastolic congestive heart failure: She had cardiomegaly and pleural effusion seen on CT angiogram and chest x-ray. These are unchanged from her 2-D echocardiogram in May 2015 which showed ejection fraction 45%-50%, decreased left global ventricular systolic function, borderline concentric left ventricular hypertrophy, mildly dilated left atrium, mildly dilated right atrium, small pericardial effusion, mitral valve regurgitation thickening of the anterior and posterior leaflets. She was not having a congestive heart failure exacerbation during this hospitalization.  4.  Hypertension: Blood pressures well controlled. No changes made to home regimen.  5.  Mildly elevated troponin: The patient complained of some right-sided chest pain, no left-sided chest pain. No significant EKG changes and troponins was initially slightly elevated at 0.08, decreasing to 0.05 during the admission. This was thought to be due to combination of strain and end-stage renal disease.   DISCHARGE PHYSICAL EXAMINATION:  VITAL SIGNS: Temperature 98.4, pulse 90, respirations 18, blood pressure 119/88, oxygenation 97% on room air.  GENERAL: No acute distress.  RESPIRATORY: Lungs clear to auscultation bilaterally with good air movement.  CARDIOVASCULAR: Regular rate and rhythm. No murmurs, rubs, or gallops. No peripheral edema. Peripheral pulses 2+.  ABDOMEN: Soft, nontender, nondistended. Bowel sounds normal. No guarding or rebound. No hepatosplenomegaly. No mass.  PSYCHIATRIC: The patient alert and oriented, good  insight into her clinical condition, eager for discharge.   LABORATORY DATA: Sodium 131, potassium 4.0, chloride  92, bicarbonate 20, BUN 72, creatinine 11.35, glucose 133. LFTs: Albumin 2.3. Other LFTs normal. Troponins as mentioned above, decreasing to 0.05 by discharge. White blood cells 7.8, hemoglobin 9.3, platelets 136,000, MCV 87. Blood cultures: No growth.   DISCHARGE MEDICATIONS:  1.  The patient is on peritoneal dialysis.  2.  Calcium acetate 667 mg 3 capsules 3 times a day with meals.  3.  DuoNeb 2.5 mg/0.5 mg in 3 mL, 3 mL inhaled 3 times a day.  4.  Furosemide 40 mg 1 tablet once a day.  5.  Metoprolol succinate 25 mg 1 tablet once a day.  6.  Albuterol 2.5 mg/3 mL, 3 mL 4 times a day as needed for shortness of breath.  7.  Cinacalcet 60 mg 1 tablet twice a day.  8.  Calcium acetate 1-2 capsules orally 2 times a day as needed.  9.  Prednisone 50 mg 1 tablet once a day.  10.  Azithromycin 500 mg 1 tablet once a day.   CONDITION ON DISCHARGE: Stable.   DISPOSITION: The patient is being discharged to home with no further home health needs.   DISCHARGE INSTRUCTIONS:  DIET: Renal diet.  ACTIVITY LIMITATIONS: None.   TIMEFRAME FOR FOLLOW-UP:   1.  Please follow up within 1-2 weeks with Dr. Darrick Huntsmanullo. 2.  Follow up with nephrology as previously scheduled.   TIME SPENT ON DISCHARGE: 40 minutes.   ____________________________ Ena Dawleyatherine P. Clent RidgesWalsh, MD cpw:mc D: 02/12/2015 20:24:35 ET T: 02/13/2015 08:03:07 ET JOB#: [id number]  cc: Santina Evansatherine P. Clent RidgesWalsh, MD, <Dictator> Duncan Dulleresa Tullo, MD Gale JourneyATHERINE P Corrine Tillis MD ELECTRONICALLY SIGNED 02/19/2015 10:43

## 2015-04-15 NOTE — H&P (Signed)
PATIENT NAME:  Dorothy Long, Dorothy Long MR#:  [id number] DATE OF BIRTH:  1961-08-05  DATE OF ADMISSION:  02/09/2015  REFERRING PHYSICIAN: Dr. Corky Downs  PRIMARY CARE PHYSICIAN: Deborra Medina, MD  NEPHROLOGIST: Anthonette Legato, MD  ADMITTING PHYSICIAN: Azucena Freed, MD  CHIEF COMPLAINT: Shortness of breath with cough, ongoing for the past 3 days.  HISTORY OF PRESENT ILLNESS: A 54 year old female with a past medical history of hypertension, end-stage renal disease on peritoneal dialysis, COPD, and history of atrial fibrillation who presents to the Emergency Room with the complaint of ongoing shortness of breath with cough for the past 3 days. The patient stated that she started having some cough with expectoration and also developed shortness of breath which gradually worsened. She also gives a history of right-sided pain with radiation to the back. She does have some chills but no fevers. No dizziness. No loss of consciousness. No palpitations. No nausea. No vomiting. No diarrhea. No urinary symptoms. The patient is an end-stage renal disease candidate and is on peritoneal dialysis and makes little urine. In the Emergency Room, the patient was evaluated by the ED physician and was found to have exacerbation of COPD secondary to acute bronchitis versus pneumonia, was given oxygen supplementation, vigorous DuoNebs and IV Solu-Medrol following which her shortness of breath improved. At the current time, the patient is currently comfortably resting in the bed and complains of some right-sided chest pain. Otherwise, no other complaints.   PAST MEDICAL HISTORY: 1.  Hypertension.  2.  End-stage renal disease on peritoneal dialysis.  3.  COPD. 4.  Atrial fibrillation in the past.   PAST SURGICAL HISTORY: No history of any surgeries in the past.   ALLERGIES: Multiple including: 1.  DEMEROL.  2.  FLAGYL.  3.  VALIUM.  4.  MORPHINE.  5.  CIPRO.  6.  TETRACYCLINE.  7.  LEVAQUIN.  8.  FLUCONAZOLE.  HOME  MEDICATIONS:  1.  Albuterol inhalation solution 4 times a day as needed.  2.  Calcium acetate 667 mg 3 capsules orally 3 times a day with meals.  3.  Cinacalcet 60 mg oral tablet 1 tablet 2 times a day.  4.  DuoNeb inhaled solution 3 times a day.  5.  Furosemide 40 mg 1 tablet orally once a day.  6.  Metoprolol extended release 25 mg tablet 1 tablet orally once a day.   FAMILY HISTORY: Mother with colon cancer and father with coronary artery disease.   SOCIAL HISTORY: She is married and lives with her husband at home. History of smoking, about 1/2 pack per day for the past many years. Denies any history of alcohol or substance abuse.   REVIEW OF SYSTEMS: CONSTITUTIONAL: Negative for fever but positive for chills. No fatigue. No generalized weakness.  EYES: Negative for blurred vision, double vision. No pain. No redness. No discharge.  EARS, NOSE, AND THROAT: Negative for tinnitus, ear pain, hearing loss, epistaxis, nasal discharge, difficulty swallowing.  RESPIRATORY: Positive for cough and shortness of breath with wheezing for the past 3 days. Negative for hemoptysis. She does have some right-sided chest pain which increases with respiration and movements.  CARDIOVASCULAR: Negative for left-sided chest pain. No palpitations. No dizziness. No syncopal episodes. No pedal edema.  GASTROINTESTINAL: Negative for nausea, vomiting, diarrhea, abdominal pain, hematemesis, melena, or rectal bleeding.  GENITOURINARY: Negative for dysuria, frequency, urgency. She is in end-stage renal disease, on peritoneal dialysis. Makes little urine.  HEMATOLOGIC AND LYMPHATIC: Positive for anemia, chronic kidney disease. Negative for bleeding, swollen  glands, bruising.  INTEGUMENTARY: Negative for acne, skin rash, or lesions.  MUSCULOSKELETAL: Negative for arthritis, gout.  NEUROLOGICAL: Negative for focal weakness or numbness. No history of CVA, TIA, seizure disorder.  PSYCHIATRIC: Negative for anxiety, insomnia,  depression.   PHYSICAL EXAMINATION: VITAL SIGNS: Temperature 98.4 degrees Fahrenheit, pulse rate 76 per minute, respiration on arrival 28 per minute, currently about 22 per minute, blood pressure 117/88, O2 saturation 96% on room air.  GENERAL: Well developed, well nourished, alert, no acute distress, comfortably resting in the bed.  HEAD: Atraumatic, normocephalic.  EYES: Pupils are equal, react to light and accommodation. No conjunctival pallor. No icterus. Extraocular movements intact. NOSE: No drainage. No lesions.  EARS: No drainage. No external lesions.  ORAL CAVITY: No mucosal lesions. No exudates.  NECK: Supple. No JVD. No thyromegaly. No carotid bruit. Range of motion of neck within normal limits.  RESPIRATORY: Good respiratory effort. Not using accessory muscles of respiration. Bilateral air entry present. Bilateral few rhonchi present. Few rales at bases on the right side.  CARDIOVASCULAR: S1, S2 regular. No murmurs, gallops, or clicks. Peripheral pulses equal at carotid, femoral, and pedal pulses. No peripheral edema.  GASTROINTESTINAL: Abdomen is soft and nontender. PD catheter on the left side of the abdomen. The site is clean. No hepatosplenomegaly. No masses. No rigidity. No guarding. Bowel sounds present.  GENITOURINARY: Deferred.  MUSCULOSKELETAL: No joint tenderness or effusion. Range of motion is adequate. Strength and tone equal bilaterally.  SKIN: Inspection within normal limits.  LYMPHATIC: No cervical lymphadenopathy.  VASCULAR: Good dorsalis pedis and posterior tibial pulses.  NEUROLOGIC: Alert, awake, and oriented x3. Cranial nerves II through XII grossly intact. No sensory deficit. Motor strength 5/5 in both upper and lower extremities. DTRs 2+ bilateral, symmetrical.  PSYCHIATRIC: Alert, awake, and oriented x3. Judgment and insight adequate. Memory and mood within normal limits.   DIAGNOSTIC DATA: Labs: Serum glucose 87, BUN 63, creatinine 10.52, sodium 137,  potassium 3.6, chloride 101, bicarb 23, total calcium 7.8, total protein 6.4, albumin 2.3, total bilirubin 0.7, alk phos 131, AST 22, ALT 12. Troponin 0.08. WBC 6.1, hemoglobin 9.5, hematocrit 29.6, platelet count 152,000.   Chest x-ray:  1.  Ill-defined patchy right basilar opacity, question small right pleural effusion. May reflect pneumonia.  2.  Cardiomegaly.  CT angiogram of the chest; impression:  1.  Negative for acute pulmonary embolism or thoracic aortic dissection.  2.  Four-chamber marked cardiac enlargement. 3.  Small right pleural effusion, perihepatic ascites.  EKG: Normal sinus rhythm with ventricular rate of 74 beats per minute, incomplete right bundle branch block, nonspecific ST-T changes.   ASSESSMENT AND PLAN: A 54 year old female with a past medical history of hypertension, chronic obstructive pulmonary disease, end-stage renal disease on peritoneal dialysis presents with cough, shortness of breath of 3 days' duration, found to have chronic obstructive pulmonary disease exacerbation secondary to acute bronchitis, possible pneumonia.  1.  Chronic obstructive pulmonary disease exacerbation secondary to acute bronchitis, rule out pneumonia. Plan: Admit to medical floor. Blood and sputum cultures. Oxygen supplementation. Vigorous DuoNebs. IV antibiotics with ceftriaxone and azithromycin. 2.  Acute bronchitis, rule out pneumonia. Plan: Blood and sputum cultures. IV antibiotics, ceftriaxone and azithromycin. DuoNebs. Follow up labs.  3.  End-stage renal disease, on peritoneal dialysis. Stable clinically. Plan: Nephrology consultation placed. Talked with Dr. Holley Raring and notified about the need for peritoneal dialysis which is going to be started today, as per nephrology.  4.  Hypertension. Stable on home medications. Continue same.  5.  Mildly elevated troponin, likely related to end-stage renal disease.  EKG with no acute changes. Plan: Monitor. Trend cardiac enzymes. 6.  History of  atrial fibrillation in the past. Currently in sinus. No acute problems. 7.  Deep vein thrombosis prophylaxis with subcutaneous heparin.  8.  Gastrointestinal prophylaxis with proton pump inhibitor.   CODE STATUS: FULL.  TIME SPENT: 50 minutes.   ____________________________ Juluis Mire, MD enr:sb D: 02/09/2015 13:02:16 ET T: 02/09/2015 13:24:38 ET JOB#: [id number]  cc: Juluis Mire, MD, <Dictator> Deborra Medina, MD [place] Lilian Kapur, MD Juluis Mire MD ELECTRONICALLY SIGNED 02/14/2015 8:23

## 2015-05-21 ENCOUNTER — Ambulatory Visit (INDEPENDENT_AMBULATORY_CARE_PROVIDER_SITE_OTHER): Payer: BC Managed Care – PPO | Admitting: Internal Medicine

## 2015-05-21 ENCOUNTER — Encounter: Payer: Self-pay | Admitting: Internal Medicine

## 2015-05-21 VITALS — BP 110/80 | HR 72 | Temp 97.8°F | Ht 70.0 in | Wt 117.8 lb

## 2015-05-21 DIAGNOSIS — S034XXA Sprain of jaw, initial encounter: Secondary | ICD-10-CM

## 2015-05-21 DIAGNOSIS — S0340XA Sprain of jaw, unspecified side, initial encounter: Secondary | ICD-10-CM | POA: Insufficient documentation

## 2015-05-21 MED ORDER — CYCLOBENZAPRINE HCL 10 MG PO TABS
10.0000 mg | ORAL_TABLET | Freq: Three times a day (TID) | ORAL | Status: DC | PRN
Start: 2015-05-21 — End: 2015-11-17

## 2015-05-21 MED ORDER — PREDNISONE 10 MG PO TABS
ORAL_TABLET | ORAL | Status: DC
Start: 2015-05-21 — End: 2015-06-15

## 2015-05-21 NOTE — Patient Instructions (Signed)
Temporomandibular Problems  Temporomandibular joint (TMJ) dysfunction means there are problems with the joint between your jaw and your skull. This is a joint lined by cartilage like other joints in your body but also has a small disc in the joint which keeps the bones from rubbing on each other. These joints are like other joints and can get inflamed (sore) from arthritis and other problems. When this joint gets sore, it can cause headaches and pain in the jaw and the face. CAUSES  Usually the arthritic types of problems are caused by soreness in the joint. Soreness in the joint can also be caused by overuse. This may come from grinding your teeth. It may also come from mis-alignment in the joint. DIAGNOSIS Diagnosis of this condition can often be made by history and exam. Sometimes your caregiver may need X-rays or an MRI scan to determine the exact cause. It may be necessary to see your dentist to determine if your teeth and jaws are lined up correctly. TREATMENT  Most of the time this problem is not serious; however, sometimes it can persist (become chronic). When this happens medications that will cut down on inflammation (soreness) help. Sometimes a shot of cortisone into the joint will be helpful. If your teeth are not aligned it may help for your dentist to make a splint for your mouth that can help this problem. If no physical problems can be found, the problem may come from tension. If tension is found to be the cause, biofeedback or relaxation techniques may be helpful. HOME CARE INSTRUCTIONS   Later in the day, applications of ice packs may be helpful. Ice can be used in a plastic bag with a towel around it to prevent frostbite to skin. This may be used about every 2 hours for 20 to 30 minutes, as needed while awake, or as directed by your caregiver.  Only take over-the-counter or prescription medicines for pain, discomfort, or fever as directed by your caregiver.  If physical therapy was  prescribed, follow your caregiver's directions.  Wear mouth appliances as directed if they were given. Document Released: 08/26/2001 Document Revised: 02/23/2012 Document Reviewed: 12/03/2008 Exit[employer] Patient Information 2015 [employer]. This information is not intended to replace advice given to you by your health care provider. Make sure you discuss any questions you have with your health care provider.  

## 2015-05-21 NOTE — Progress Notes (Signed)
Subjective:  Patient ID: Dorothy Long, female    DOB: 1961/10/14  Age: 54 y.o. MRN: [id number]  CC: The encounter diagnosis was TMJ (sprain of temporomandibular joint), initial encounter.  HPI: [name] presents for persistent left TMJ pain.  sympotms started a week ago while opening mouth in church.  She felt a pop,  Then experienced pain in the left TMJ which spread to left ear and left zygoma.  Risk factors include frequent chewing of ice cubes (whiile in dialysis).   Outpatient Prescriptions Prior to Visit  Medication Sig Dispense Refill  . acetaminophen (TYLENOL) 325 MG tablet Take 325 mg by mouth every 4 (four) hours as needed.     Marland Kitchen albuterol (VENTOLIN HFA) 108 (90 BASE) MCG/ACT inhaler Inhale into the lungs every 6 (six) hours as needed for wheezing or shortness of breath.    . ALPRAZolam (XANAX) 0.25 MG tablet Take 0.25 mg by mouth 3 (three) times daily as needed for sleep.    . Calcium Acetate 667 MG TABS Take 3 capsules by mouth 3 (three) times daily.    . cinacalcet (SENSIPAR) 30 MG tablet Take 30 mg by mouth daily.    . diphenhydrAMINE (BENADRYL) 25 MG tablet Take 25 mg by mouth every other day. MON, WED, FRI BEFORE DIALYSIS    . fluconazole (DIFLUCAN) 150 MG tablet Take 1 tablet (150 mg total) by mouth daily. 2 tablet 0  . fluticasone (FLONASE) 50 MCG/ACT nasal spray Place 2 sprays into both nostrils daily. 16 g 2  . furosemide (LASIX) 40 MG tablet Take 1 tablet (40 mg total) by mouth daily. 30 tablet 6  . HYDROcodone-acetaminophen (NORCO/VICODIN) 5-325 MG per tablet Take 1 tablet by mouth every 6 (six) hours as needed for moderate pain.    . metolazone (ZAROXOLYN) 5 MG tablet Take 1 tablet (5 mg total) by mouth daily as needed. 30 tablet 3  . metoprolol succinate (TOPROL-XL) 25 MG 24 hr tablet TAKE 1 TABLET (25 MG TOTAL) BY MOUTH DAILY. 30 tablet 5  . mometasone (ELOCON) 0.1 % cream Apply 1 application topically daily. To ear canal 45 g 0  . tiotropium  (SPIRIVA HANDIHALER) 18 MCG inhalation capsule Place 1 capsule (18 mcg total) into inhaler and inhale daily. 30 capsule 11  . cyclobenzaprine (FLEXERIL) 10 MG tablet Take 1 tablet (10 mg total) by mouth 3 (three) times daily as needed for muscle spasms. 30 tablet 0   No facility-administered medications prior to visit.    Review of Systems;  Patient denies headache, fevers, malaise, unintentional weight loss, skin rash, eye pain, sinus congestion and sinus pain, sore throat, dysphagia,  hemoptysis , cough, dyspnea, wheezing, chest pain, palpitations, orthopnea, edema, abdominal pain, nausea, melena, diarrhea, constipation, flank pain, dysuria, hematuria, urinary  Frequency, nocturia, numbness, tingling, seizures,  Focal weakness, Loss of consciousness,  Tremor, insomnia, depression, anxiety, and suicidal ideation.      Objective:  BP 110/80 mmHg  Pulse 72  Temp(Src) 97.8 F (36.6 C) (Oral)  Ht  (1.778 m)  Wt 117 lb 12 oz (53.411 kg)  BMI 16.90 kg/m2  SpO2 98%  BP Readings from Last 3 Encounters:  05/21/15 110/80  03/12/15 104/70  02/19/15 114/64    Wt Readings from Last 3 Encounters:  05/21/15 117 lb 12 oz (53.411 kg)  03/12/15 122 lb 8 oz (55.566 kg)  02/19/15 125 lb 8 oz (56.926 kg)    General appearance: alert, cooperative and appears stated age Ears: normal TM's and  external ear canals both ears Face : tender over left TMJ .  No masses Throat: lips, mucosa, and tongue normal; teeth and gums normal Jaw; left TMJ tenderness , no parotid mass ,  No LAD Neck: no adenopathy, no carotid bruit, supple, symmetrical, trachea midline and thyroid not enlarged, symmetric, no tenderness/mass/nodules Back: symmetric, no curvature. ROM normal. No CVA tenderness. Lungs: clear to auscultation bilaterally Heart: regular rate and rhythm, S1, S2 normal, no murmur, click, rub or gallop Abdomen: soft, non-tender; bowel sounds normal; no masses,  no organomegaly Pulses: 2+ and  symmetric Skin: Skin color, texture, turgor normal. No rashes or lesions Lymph nodes: Cervical, supraclavicular, and axillary nodes normal.  No results found for: HGBA1C  Lab Results  Component Value Date   CREATININE 8.87* 11/15/2014   CREATININE 8.9* 11/15/2014   CREATININE 8.70* 11/13/2014    Lab Results  Component Value Date   WBC 7.7 11/15/2014   HGB 10.2* 11/15/2014   HCT 31.6* 11/15/2014   PLT 117* 11/15/2014   GLUCOSE 138* 11/15/2014   LDLDIRECT 164.6 07/21/2012   ALT 24 11/07/2014   AST 34 11/07/2014   NA 135* 11/15/2014   K 3.7 11/15/2014   CL 102 11/15/2014   CREATININE 8.87* 11/15/2014   BUN 75* 11/15/2014   CO2 16* 11/15/2014   TSH 0.83 07/10/2013   INR 1.0 07/11/2013   MICROALBUR 63.1* 06/19/2014    No results found.  Assessment & Plan:   Problem List Items Addressed This Visit    TMJ (sprain of temporomandibular joint) - Primary    Suggested by history and exam.  Prednisone taper x 6 days,  Flexeril,  If no improvement in one week  X rays          I am having Ms. Balik start on predniSONE. I am also having her maintain her ALPRAZolam, acetaminophen, diphenhydrAMINE, Calcium Acetate, HYDROcodone-acetaminophen, albuterol, cinacalcet, metolazone, tiotropium, mometasone, furosemide, metoprolol succinate, fluconazole, fluticasone, and cyclobenzaprine.  Meds ordered this encounter  Medications  . cyclobenzaprine (FLEXERIL) 10 MG tablet    Sig: Take 1 tablet (10 mg total) by mouth 3 (three) times daily as needed for muscle spasms.    Dispense:  30 tablet    Refill:  0  . predniSONE (DELTASONE) 10 MG tablet    Sig: 6 tablets on Day 1 , then reduce by 1 tablet daily until gone    Dispense:  21 tablet    Refill:  0    Medications Discontinued During This Encounter  Medication Reason  . cyclobenzaprine (FLEXERIL) 10 MG tablet Reorder    Follow-up: No Follow-up on file.   Sherlene ShamsULLO, TERESA L, MD

## 2015-05-21 NOTE — Progress Notes (Signed)
Pre visit review using our clinic review tool, if applicable. No additional management support is needed unless otherwise documented below in the visit note. 

## 2015-05-21 NOTE — Assessment & Plan Note (Signed)
Suggested by history and exam.  Prednisone taper x 6 days,  Flexeril,  If no improvement in one week  X rays

## 2015-06-14 ENCOUNTER — Emergency Department: Payer: BC Managed Care – PPO

## 2015-06-14 ENCOUNTER — Encounter: Payer: Self-pay | Admitting: Emergency Medicine

## 2015-06-14 ENCOUNTER — Observation Stay
Admission: EM | Admit: 2015-06-14 | Discharge: 2015-06-16 | Disposition: A | Discharge status: Home / Self Care | Payer: BC Managed Care – PPO | Attending: Specialist | Admitting: Specialist

## 2015-06-14 DIAGNOSIS — E1121 Type 2 diabetes mellitus with diabetic nephropathy: Secondary | ICD-10-CM | POA: Diagnosis not present

## 2015-06-14 DIAGNOSIS — F17210 Nicotine dependence, cigarettes, uncomplicated: Secondary | ICD-10-CM | POA: Diagnosis not present

## 2015-06-14 DIAGNOSIS — F419 Anxiety disorder, unspecified: Secondary | ICD-10-CM | POA: Insufficient documentation

## 2015-06-14 DIAGNOSIS — Z800 Family history of malignant neoplasm of digestive organs: Secondary | ICD-10-CM | POA: Diagnosis not present

## 2015-06-14 DIAGNOSIS — Z7982 Long term (current) use of aspirin: Secondary | ICD-10-CM | POA: Insufficient documentation

## 2015-06-14 DIAGNOSIS — D631 Anemia in chronic kidney disease: Secondary | ICD-10-CM | POA: Diagnosis not present

## 2015-06-14 DIAGNOSIS — I5032 Chronic diastolic (congestive) heart failure: Secondary | ICD-10-CM | POA: Diagnosis not present

## 2015-06-14 DIAGNOSIS — K469 Unspecified abdominal hernia without obstruction or gangrene: Secondary | ICD-10-CM

## 2015-06-14 DIAGNOSIS — I050 Rheumatic mitral stenosis: Secondary | ICD-10-CM | POA: Diagnosis not present

## 2015-06-14 DIAGNOSIS — I480 Paroxysmal atrial fibrillation: Secondary | ICD-10-CM | POA: Insufficient documentation

## 2015-06-14 DIAGNOSIS — I120 Hypertensive chronic kidney disease with stage 5 chronic kidney disease or end stage renal disease: Secondary | ICD-10-CM | POA: Insufficient documentation

## 2015-06-14 DIAGNOSIS — R0602 Shortness of breath: Secondary | ICD-10-CM | POA: Diagnosis present

## 2015-06-14 DIAGNOSIS — E785 Hyperlipidemia, unspecified: Secondary | ICD-10-CM | POA: Insufficient documentation

## 2015-06-14 DIAGNOSIS — R079 Chest pain, unspecified: Principal | ICD-10-CM | POA: Diagnosis present

## 2015-06-14 DIAGNOSIS — K4090 Unilateral inguinal hernia, without obstruction or gangrene, not specified as recurrent: Secondary | ICD-10-CM | POA: Insufficient documentation

## 2015-06-14 DIAGNOSIS — E559 Vitamin D deficiency, unspecified: Secondary | ICD-10-CM | POA: Diagnosis not present

## 2015-06-14 DIAGNOSIS — Z992 Dependence on renal dialysis: Secondary | ICD-10-CM | POA: Insufficient documentation

## 2015-06-14 DIAGNOSIS — N2581 Secondary hyperparathyroidism of renal origin: Secondary | ICD-10-CM | POA: Diagnosis not present

## 2015-06-14 DIAGNOSIS — R1033 Periumbilical pain: Secondary | ICD-10-CM | POA: Diagnosis not present

## 2015-06-14 DIAGNOSIS — J449 Chronic obstructive pulmonary disease, unspecified: Secondary | ICD-10-CM | POA: Insufficient documentation

## 2015-06-14 DIAGNOSIS — Z87891 Personal history of nicotine dependence: Secondary | ICD-10-CM | POA: Insufficient documentation

## 2015-06-14 DIAGNOSIS — N186 End stage renal disease: Secondary | ICD-10-CM | POA: Insufficient documentation

## 2015-06-14 DIAGNOSIS — R1031 Right lower quadrant pain: Secondary | ICD-10-CM | POA: Diagnosis not present

## 2015-06-14 HISTORY — DX: Dependence on renal dialysis: Z99.2

## 2015-06-14 LAB — CBC
HCT: 29.4 % — ABNORMAL LOW (ref 35.0–47.0)
HEMOGLOBIN: 9.5 g/dL — AB (ref 12.0–16.0)
MCH: 27.7 pg (ref 26.0–34.0)
MCHC: 32.2 g/dL (ref 32.0–36.0)
MCV: 86 fL (ref 80.0–100.0)
Platelets: 178 10*3/uL (ref 150–440)
RBC: 3.42 MIL/uL — ABNORMAL LOW (ref 3.80–5.20)
RDW: 18.7 % — ABNORMAL HIGH (ref 11.5–14.5)
WBC: 7.7 10*3/uL (ref 3.6–11.0)

## 2015-06-14 LAB — COMPREHENSIVE METABOLIC PANEL
ALT: 13 U/L — ABNORMAL LOW (ref 14–54)
AST: 20 U/L (ref 15–41)
Albumin: 2.7 g/dL — ABNORMAL LOW (ref 3.5–5.0)
Alkaline Phosphatase: 130 U/L — ABNORMAL HIGH (ref 38–126)
Anion gap: 20 — ABNORMAL HIGH (ref 5–15)
BUN: 69 mg/dL — AB (ref 6–20)
CALCIUM: 7.7 mg/dL — AB (ref 8.9–10.3)
CO2: 22 mmol/L (ref 22–32)
Chloride: 92 mmol/L — ABNORMAL LOW (ref 101–111)
Creatinine, Ser: 9.41 mg/dL — ABNORMAL HIGH (ref 0.44–1.00)
GFR calc Af Amer: 5 mL/min — ABNORMAL LOW (ref >60)
GFR, EST NON AFRICAN AMERICAN: 4 mL/min — AB (ref >60)
GLUCOSE: 87 mg/dL (ref 65–99)
POTASSIUM: 3.1 mmol/L — AB (ref 3.5–5.1)
Sodium: 134 mmol/L — ABNORMAL LOW (ref 135–145)
Total Bilirubin: 0.5 mg/dL (ref 0.3–1.2)
Total Protein: 6 g/dL — ABNORMAL LOW (ref 6.5–8.1)

## 2015-06-14 LAB — PROTIME-INR
INR: 1.1
PROTHROMBIN TIME: 14.4 s (ref 11.4–15.0)

## 2015-06-14 LAB — TROPONIN I: Troponin I: 0.1 ng/mL — ABNORMAL HIGH (ref <0.031)

## 2015-06-14 MED ORDER — IOHEXOL 300 MG/ML  SOLN
80.0000 mL | Freq: Once | INTRAMUSCULAR | Status: AC | PRN
  Administered 2015-06-14: 80 mL via INTRAVENOUS

## 2015-06-14 NOTE — ED Notes (Signed)
Patient transported to CT 

## 2015-06-14 NOTE — ED Notes (Signed)
Patient c/o sharp abdominal pain x1 week and nausea. Also c/o shortness of breath x2-3 days. Hx COPD

## 2015-06-15 ENCOUNTER — Observation Stay: Payer: BC Managed Care – PPO

## 2015-06-15 DIAGNOSIS — R079 Chest pain, unspecified: Secondary | ICD-10-CM | POA: Diagnosis present

## 2015-06-15 LAB — URINALYSIS COMPLETE WITH MICROSCOPIC (ARMC ONLY)
Bilirubin Urine: NEGATIVE
GLUCOSE, UA: NEGATIVE mg/dL
Ketones, ur: NEGATIVE mg/dL
NITRITE: NEGATIVE
PROTEIN: 100 mg/dL — AB
SPECIFIC GRAVITY, URINE: 1.019 (ref 1.005–1.030)
pH: 5 (ref 5.0–8.0)

## 2015-06-15 LAB — CBC
HCT: 27.2 % — ABNORMAL LOW (ref 35.0–47.0)
HEMOGLOBIN: 8.8 g/dL — AB (ref 12.0–16.0)
MCH: 27.9 pg (ref 26.0–34.0)
MCHC: 32.5 g/dL (ref 32.0–36.0)
MCV: 85.7 fL (ref 80.0–100.0)
Platelets: 161 10*3/uL (ref 150–440)
RBC: 3.18 MIL/uL — ABNORMAL LOW (ref 3.80–5.20)
RDW: 19.1 % — ABNORMAL HIGH (ref 11.5–14.5)
WBC: 6.7 10*3/uL (ref 3.6–11.0)

## 2015-06-15 LAB — TROPONIN I
TROPONIN I: 0.11 ng/mL — AB (ref <0.031)
Troponin I: 0.11 ng/mL — ABNORMAL HIGH (ref <0.031)
Troponin I: 0.1 ng/mL — ABNORMAL HIGH (ref <0.031)

## 2015-06-15 LAB — CREATININE, SERUM
CREATININE: 9.15 mg/dL — AB (ref 0.44–1.00)
GFR calc Af Amer: 5 mL/min — ABNORMAL LOW (ref >60)
GFR calc non Af Amer: 4 mL/min — ABNORMAL LOW (ref >60)

## 2015-06-15 LAB — LIPASE, BLOOD: Lipase: 79 U/L — ABNORMAL HIGH (ref 22–51)

## 2015-06-15 MED ORDER — GENTAMICIN SULFATE 0.1 % EX CREA
1.0000 "application " | TOPICAL_CREAM | Freq: Every day | CUTANEOUS | Status: DC
  Administered 2015-06-15: 1 via TOPICAL
  Filled 2015-06-15: qty 15

## 2015-06-15 MED ORDER — ONDANSETRON HCL 4 MG/2ML IJ SOLN
4.0000 mg | Freq: Once | INTRAMUSCULAR | Status: AC
  Administered 2015-06-15: 4 mg via INTRAVENOUS

## 2015-06-15 MED ORDER — HYDROCODONE-ACETAMINOPHEN 5-325 MG PO TABS
1.0000 | ORAL_TABLET | Freq: Four times a day (QID) | ORAL | Status: DC | PRN
  Administered 2015-06-15 (×3): 1 via ORAL
  Filled 2015-06-15 (×3): qty 1

## 2015-06-15 MED ORDER — CALCIUM ACETATE (PHOS BINDER) 667 MG PO CAPS
2001.0000 mg | ORAL_CAPSULE | Freq: Three times a day (TID) | ORAL | Status: DC
  Administered 2015-06-15 – 2015-06-16 (×4): 2001 mg via ORAL
  Filled 2015-06-15 (×4): qty 3

## 2015-06-15 MED ORDER — MORPHINE SULFATE 2 MG/ML IJ SOLN: 2.0000 mg | INTRAMUSCULAR | Status: DC | PRN

## 2015-06-15 MED ORDER — IOHEXOL 350 MG/ML SOLN
Freq: Once | INTRAVENOUS | Status: AC
  Administered 2015-06-15: 18:00:00 via INTRAPERITONEAL
  Filled 2015-06-15: qty 2000

## 2015-06-15 MED ORDER — HEPARIN SODIUM (PORCINE) 5000 UNIT/ML IJ SOLN
5000.0000 [IU] | Freq: Three times a day (TID) | INTRAMUSCULAR | Status: DC
  Administered 2015-06-15 – 2015-06-16 (×4): 5000 [IU] via SUBCUTANEOUS
  Filled 2015-06-15 (×4): qty 1

## 2015-06-15 MED ORDER — METOPROLOL SUCCINATE ER 25 MG PO TB24
25.0000 mg | ORAL_TABLET | Freq: Every day | ORAL | Status: DC
  Administered 2015-06-15: 25 mg via ORAL
  Filled 2015-06-15 (×2): qty 1

## 2015-06-15 MED ORDER — ASPIRIN EC 81 MG PO TBEC
81.0000 mg | DELAYED_RELEASE_TABLET | Freq: Every day | ORAL | Status: DC
  Administered 2015-06-15 – 2015-06-16 (×2): 81 mg via ORAL
  Filled 2015-06-15 (×2): qty 1

## 2015-06-15 MED ORDER — SODIUM CHLORIDE 0.9 % IV BOLUS (SEPSIS)
500.0000 mL | Freq: Once | INTRAVENOUS | Status: AC
  Administered 2015-06-14: 500 mL via INTRAVENOUS

## 2015-06-15 MED ORDER — ACETAMINOPHEN 325 MG PO TABS: 650.0000 mg | ORAL_TABLET | Freq: Four times a day (QID) | ORAL | Status: DC | PRN

## 2015-06-15 MED ORDER — ONDANSETRON HCL 4 MG/2ML IJ SOLN
INTRAMUSCULAR | Status: AC
  Administered 2015-06-15: 4 mg via INTRAVENOUS
  Filled 2015-06-15: qty 2

## 2015-06-15 MED ORDER — MORPHINE SULFATE 2 MG/ML IJ SOLN
INTRAMUSCULAR | Status: AC
  Administered 2015-06-15: 1 mg via INTRAVENOUS
  Filled 2015-06-15: qty 1

## 2015-06-15 MED ORDER — SODIUM CHLORIDE 0.9 % IJ SOLN
3.0000 mL | Freq: Two times a day (BID) | INTRAMUSCULAR | Status: DC
  Administered 2015-06-15 – 2015-06-16 (×4): 3 mL via INTRAVENOUS

## 2015-06-15 MED ORDER — MORPHINE SULFATE 2 MG/ML IJ SOLN
2.0000 mg | Freq: Once | INTRAMUSCULAR | Status: AC
  Administered 2015-06-15: 1 mg via INTRAVENOUS

## 2015-06-15 MED ORDER — HEPARIN SODIUM (PORCINE) 1000 UNIT/ML IJ SOLN: 500.0000 [IU] | INTRAMUSCULAR | Status: DC | PRN

## 2015-06-15 MED ORDER — ONDANSETRON HCL 4 MG/2ML IJ SOLN
4.0000 mg | Freq: Four times a day (QID) | INTRAMUSCULAR | Status: DC | PRN
  Administered 2015-06-15 – 2015-06-16 (×2): 4 mg via INTRAVENOUS
  Filled 2015-06-15: qty 2

## 2015-06-15 MED ORDER — CALCIUM ACETATE 667 MG PO TABS: 3.0000 | ORAL_TABLET | Freq: Three times a day (TID) | ORAL | Status: DC

## 2015-06-15 MED ORDER — CINACALCET HCL 30 MG PO TABS
150.0000 mg | ORAL_TABLET | Freq: Every day | ORAL | Status: DC
  Administered 2015-06-16: 150 mg via ORAL
  Filled 2015-06-15: qty 5

## 2015-06-15 MED ORDER — TIOTROPIUM BROMIDE MONOHYDRATE 18 MCG IN CAPS
18.0000 ug | ORAL_CAPSULE | Freq: Every day | RESPIRATORY_TRACT | Status: DC
  Administered 2015-06-15 – 2015-06-16 (×2): 18 ug via RESPIRATORY_TRACT
  Filled 2015-06-15: qty 5

## 2015-06-15 MED ORDER — CINACALCET HCL 30 MG PO TABS
30.0000 mg | ORAL_TABLET | Freq: Every day | ORAL | Status: DC
  Administered 2015-06-15: 30 mg via ORAL
  Filled 2015-06-15: qty 1

## 2015-06-15 MED ORDER — CYCLOBENZAPRINE HCL 10 MG PO TABS
10.0000 mg | ORAL_TABLET | Freq: Three times a day (TID) | ORAL | Status: DC | PRN
Start: 2015-06-15 — End: 2015-06-16

## 2015-06-15 MED ORDER — ALPRAZOLAM 0.25 MG PO TABS: 0.2500 mg | ORAL_TABLET | Freq: Three times a day (TID) | ORAL | Status: DC | PRN

## 2015-06-15 MED ORDER — DELFLEX-LC/1.5% DEXTROSE 346 MOSM/L IP SOLN
INTRAPERITONEAL | Status: DC
  Administered 2015-06-15: 18:00:00 via INTRAPERITONEAL

## 2015-06-15 MED ORDER — FUROSEMIDE 40 MG PO TABS
40.0000 mg | ORAL_TABLET | Freq: Every day | ORAL | Status: DC
  Administered 2015-06-15 – 2015-06-16 (×2): 40 mg via ORAL
  Filled 2015-06-15 (×2): qty 1

## 2015-06-15 MED ORDER — NITROGLYCERIN 0.4 MG SL SUBL: 0.4000 mg | SUBLINGUAL_TABLET | SUBLINGUAL | Status: DC | PRN

## 2015-06-15 MED ORDER — ACETAMINOPHEN 650 MG RE SUPP: 650.0000 mg | Freq: Four times a day (QID) | RECTAL | Status: DC | PRN

## 2015-06-15 NOTE — ED Notes (Signed)
Pt. Vomited a medium amount during administration of morphine.

## 2015-06-15 NOTE — ED Notes (Signed)
Pt. States she is here for abdominal pain and back pain.  Pt. States she is a dialysis pt. Who does her own dialysis at home every night.  Pt. Also states chills for the past week.  Pt. States "I am hungry".

## 2015-06-15 NOTE — ED Notes (Signed)
Pt. Given graham and saltine crackers, ok'd by doctor.

## 2015-06-15 NOTE — ED Provider Notes (Signed)
[facility] Emergency Department Provider Note  ____________________________________________  Time seen: 11:45 PM   I have reviewed the triage vital signs and the nursing notes.   HISTORY  Chief Complaint Abdominal Pain and Shortness of Breath      HPI Dorothy Long is a 54 y.o. female presents with generalized abdominal discomfort times one week accompanied by nausea and diarrhea 4 days. Patient also admits to chills. In addition patient admits to dyspnea 3 days with nonproductive cough. In addition patient admits to central chest pain that is nonradiating    Past Medical History  Diagnosis Date  . Hypertension   . Hyperlipidemia   . COPD (chronic obstructive pulmonary disease)   . Anemia   . Mitral valve stenosis, severe   . CHF (congestive heart failure)   . Chronic kidney disease   . Acute on chronic renal failure   . Diastolic congestive heart failure   . A-fib   . Dialysis patient   . Diabetes mellitus without complication     Patient Active Problem List   Diagnosis Date Noted  . TMJ (sprain of temporomandibular joint) 05/21/2015  . Vaginitis and vulvovaginitis 03/14/2015  . Hospital discharge follow-up 11/28/2014  . Dysuria 11/28/2014  . Infection of urinary tract 11/07/2014  . Postmenopausal atrophic vaginitis 06/19/2014  . Allergic rhinitis 05/29/2014  . Bilateral leg edema 05/15/2014  . Screening for STD (sexually transmitted disease) 02/01/2014  . Screening for cervical cancer 02/01/2014  . Screening for breast cancer 02/01/2014  . Encounter for preventive health examination 02/01/2014  . Awaiting organ transplant 12/23/2013  . Hyperparathyroidism due to renal insufficiency 12/23/2013  . Excess fluid volume 09/27/2013  . Paroxysmal atrial fibrillation 09/25/2013  . GERD (gastroesophageal reflux disease) 08/12/2013  . Focal and segmental hyalinosis 08/08/2013  . Mitral stenosis with incompetence or regurgitation  07/19/2013  . Anemia in chronic kidney disease 07/19/2013  . End stage kidney disease 07/14/2013  . Tobacco abuse counseling 12/20/2012  . Vitamin D deficiency 07/22/2012  . Hyperlipidemia   . Hypertension 07/21/2012  . Family history of colon cancer 07/21/2012  . History of tobacco abuse 07/21/2012    Past Surgical History  Procedure Laterality Date  . Abdominal hysterectomy    . Cholecystectomy    . Knee surgery Right   . Foot surgery Bilateral   . Av fistula placement      Current Outpatient Rx  Name  Route  Sig  Dispense  Refill  . acetaminophen (TYLENOL) 325 MG tablet   Oral   Take 325 mg by mouth every 4 (four) hours as needed.          Marland Kitchen albuterol (VENTOLIN HFA) 108 (90 BASE) MCG/ACT inhaler   Inhalation   Inhale into the lungs every 6 (six) hours as needed for wheezing or shortness of breath.         . ALPRAZolam (XANAX) 0.25 MG tablet   Oral   Take 0.25 mg by mouth 3 (three) times daily as needed for sleep.         . Calcium Acetate 667 MG TABS   Oral   Take 3 capsules by mouth 3 (three) times daily.         . cinacalcet (SENSIPAR) 30 MG tablet   Oral   Take 30 mg by mouth daily.         . cyclobenzaprine (FLEXERIL) 10 MG tablet   Oral   Take 1 tablet (10 mg total) by mouth 3 (three) times  daily as needed for muscle spasms.   30 tablet   0   . diphenhydrAMINE (BENADRYL) 25 MG tablet   Oral   Take 25 mg by mouth every other day. MON, WED, FRI BEFORE DIALYSIS         . fluconazole (DIFLUCAN) 150 MG tablet   Oral   Take 1 tablet (150 mg total) by mouth daily.   2 tablet   0   . fluticasone (FLONASE) 50 MCG/ACT nasal spray   Each Nare   Place 2 sprays into both nostrils daily.   16 g   2   . furosemide (LASIX) 40 MG tablet   Oral   Take 1 tablet (40 mg total) by mouth daily.   30 tablet   6   . HYDROcodone-acetaminophen (NORCO/VICODIN) 5-325 MG per tablet   Oral   Take 1 tablet by mouth every 6 (six) hours as needed for moderate  pain.         . metolazone (ZAROXOLYN) 5 MG tablet   Oral   Take 1 tablet (5 mg total) by mouth daily as needed.   30 tablet   3   . metoprolol succinate (TOPROL-XL) 25 MG 24 hr tablet      TAKE 1 TABLET (25 MG TOTAL) BY MOUTH DAILY.   30 tablet   5   . mometasone (ELOCON) 0.1 % cream   Topical   Apply 1 application topically daily. To ear canal   45 g   0   . predniSONE (DELTASONE) 10 MG tablet      6 tablets on Day 1 , then reduce by 1 tablet daily until gone   21 tablet   0   . tiotropium (SPIRIVA HANDIHALER) 18 MCG inhalation capsule   Inhalation   Place 1 capsule (18 mcg total) into inhaler and inhale daily.   30 capsule   11     Allergies Ciprocinonide; Demerol; Diflucan; Flagyl; Levaquin; Morphine and related; Tetracyclines & related; and Valium  Family History  Problem Relation Age of Onset  . Cancer Mother     colon  . Heart disease Father   . Hypertension Father     Social History History  Substance Use Topics  . Smoking status: Current Some Day Smoker -- 0.25 packs/day for 20 years    Types: Cigarettes  . Smokeless tobacco: Never Used  . Alcohol Use: No     Comment: occasional    Review of Systems  Constitutional: Negative for fever. Eyes: Negative for visual changes. ENT: Negative for sore throat. Cardiovascular: Positive for chest pain. Respiratory: Positive for cough shortness of breath Gastrointestinal: Positive for abdominal pain, vomiting and diarrhea. Genitourinary: Negative for dysuria. Musculoskeletal: Negative for back pain. Skin: Negative for rash. Neurological: Negative for headaches, focal weakness or numbness.   10-point ROS otherwise negative.  ____________________________________________   PHYSICAL EXAM:  VITAL SIGNS: ED Triage Vitals  Enc Vitals Group     BP 06/14/15 2241 105/65 mmHg     Pulse Rate 06/14/15 2241 93     Resp 06/14/15 2241 18     Temp 06/14/15 2241 98.7 F (37.1 C)     Temp Source 06/14/15  2241 Oral     SpO2 06/14/15 2241 98 %     Weight 06/14/15 2241 114 lb (51.71 kg)     Height 06/14/15 2241 5\' 10"  (1.778 m)     Head Cir --      Peak Flow --  Pain Score 06/14/15 2236 10     Pain Loc --      Pain Edu? --      Excl. in GC? --      Constitutional: Alert and oriented. Well appearing and in no distress. Eyes: Conjunctivae are normal. PERRL. Normal extraocular movements. ENT   Head: Normocephalic and atraumatic.   Nose: No congestion/rhinnorhea.   Mouth/Throat: Mucous membranes are moist.   Neck: No stridor. Cardiovascular: Normal rate, regular rhythm. Normal and symmetric distal pulses are present in all extremities. No murmurs, rubs, or gallops. Respiratory: Normal respiratory effort without tachypnea nor retractions. Breath sounds are clear and equal bilaterally. No wheezes/rales/rhonchi. Gastrointestinal: Generalized abdominal discomfort with palpation. No distention. There is no CVA tenderness. Genitourinary: deferred Musculoskeletal: Nontender with normal range of motion in all extremities. No joint effusions.  No lower extremity tenderness nor edema. Neurologic:  Normal speech and language. No gross focal neurologic deficits are appreciated. Speech is normal.  Skin:  Skin is warm, dry and intact. No rash noted. Psychiatric: Mood and affect are normal. Speech and behavior are normal. Patient exhibits appropriate insight and judgment.  ____________________________________________    LABS (pertinent positives/negatives)  Labs Reviewed  CBC - Abnormal; Notable for the following:    RBC 3.42 (*)    Hemoglobin 9.5 (*)    HCT 29.4 (*)    RDW 18.7 (*)    All other components within normal limits  COMPREHENSIVE METABOLIC PANEL - Abnormal; Notable for the following:    Sodium 134 (*)    Potassium 3.1 (*)    Chloride 92 (*)    BUN 69 (*)    Creatinine, Ser 9.41 (*)    Calcium 7.7 (*)    Total Protein 6.0 (*)    Albumin 2.7 (*)    ALT 13 (*)     Alkaline Phosphatase 130 (*)    GFR calc non Af Amer 4 (*)    GFR calc Af Amer 5 (*)    Anion gap 20 (*)    All other components within normal limits  TROPONIN I - Abnormal; Notable for the following:    Troponin I 0.10 (*)    All other components within normal limits  PROTIME-INR  URINALYSIS COMPLETEWITH MICROSCOPIC ([facility] ONLY)  LIPASE, BLOOD     ____________________________________________   EKG Interpreted by me and Dr. Bayard Malesandolph Brown   Date: 06/15/2015  Rate: 94  Rhythm: normal sinus rhythm  QRS Axis: normal  Intervals: normal  ST/T Wave abnormalities: normal  Conduction Disutrbances: none  Narrative Interpretation: unremarkable      ____________________________________________    RADIOLOGY  CT scan of the abdomen and pelvis revealed  __IMPRESSION: Free fluid and free air within the abdomen which given the patient's peritoneal dialysis catheter may simply be related to peritoneal dialysis therapy. No definitive findings to suggest bowel perforation are seen.  Mottled appearance to the liver which is stable over multiple previous exams consistent with elevated central venous pressures  Shrunken native kidneys consistent with the given clinical history of end-stage renal disease. __________________________________________    INITIAL IMPRESSION / ASSESSMENT AND PLAN / ED COURSE  Pertinent labs & imaging results that were available during my care of the patient were reviewed by me and considered in my medical decision making (see chart for details).  Patient CAT scan revealed fluid and free air which is most likely consistent with the patient's history of peritoneal dialysis. Given no definitive finding of bowel perforation seen. Patient's troponin however was 0.10  as such we'll admit the patient for serial troponins. Patient was discussed with Dr. Clint Guy for admission  ____________________________________________   FINAL CLINICAL IMPRESSION(S) / ED  DIAGNOSES  Final diagnoses:  None      Darci Current, MD 06/15/15 [phone]

## 2015-06-15 NOTE — Care Management (Signed)
Patient admitted under observation for abd pain.  She is esrd with home peritoneal dialysis.

## 2015-06-15 NOTE — Progress Notes (Signed)
Paged Dr. Cherlynn KaiserSainani regarding pt scheduled AM lasix and metoprolol and asked if pt can take them as ordered given elevated BUN and Cr and low BP. MD gave go ahead to give both medications as ordered.

## 2015-06-15 NOTE — Progress Notes (Signed)
Subjective:   Patient presents with RLQ abdominal pain. No fever. PD fluid is not cloudy. No tunnel tenderness + early satiety + rt hip pain CT abdomen suggests atherosclerosis.  Patient reports left femoral hernia with PD   Objective:  Vital signs in last 24 hours:  Temp:  [98.7 F (37.1 C)] 98.7 F (37.1 C) (06/30 2241) Pulse Rate:  [75-94] 75 (07/01 0844) Resp:  [15-21] 18 (07/01 0348) BP: (90-118)/(56-88) 96/67 mmHg (07/01 0844) SpO2:  [97 %-99 %] 99 % (07/01 0348) Weight:  [51.71 kg (114 lb)-52.164 kg (115 lb)] 52.164 kg (115 lb) (07/01 0348)  Weight change:  Filed Weights   06/14/15 2241 06/15/15 0348  Weight: 51.71 kg (114 lb) 52.164 kg (115 lb)    Intake/Output:       Physical Exam: General: NAD  HEENT Anicteric, moist mucus membranes  Neck supple  Pulm/lungs Clear, normal effort  CVS/Heart Irregular, no rub  Abdomen:  Soft, rt LQ tenderness, no tunnel tenderness  Extremities: No edema  Neurologic: Alert, oreinted  Skin: No rashes  Access: PD cathter, left forearm AVF       Basic Metabolic Panel:  Recent Labs Lab 06/14/15 2243 06/15/15 0424  NA 134*  --   K 3.1*  --   CL 92*  --   CO2 22  --   GLUCOSE 87  --   BUN 69*  --   CREATININE 9.41* 9.15*  CALCIUM 7.7*  --      CBC:  Recent Labs Lab 06/14/15 2243 06/15/15 0424  WBC 7.7 6.7  HGB 9.5* 8.8*  HCT 29.4* 27.2*  MCV 86.0 85.7  PLT 178 161      Microbiology: Results for orders placed or performed in visit on 11/28/14  Urine Culture     Status: None   Collection Time: 11/28/14  8:39 AM  Result Value Ref Range Status   Culture STAPHYLOCOCCUS SPECIES (COAGULASE NEGATIVE)  Final   Colony Count 10,000 COLONIES/ML  Final   Organism ID, Bacteria STAPHYLOCOCCUS SPECIES (COAGULASE NEGATIVE)  Final    Comment: Rifampin and Gentamicin should not be used as single drugs for treatment of Staph infections.       Susceptibility   Staphylococcus species (coagulase negative) -  (no  method available)    PENICILLIN >=0.5 Resistant     OXACILLIN >=4 Resistant     CEFAZOLIN  Resistant     GENTAMICIN <=0.5 Sensitive     CIPROFLOXACIN >=8 Resistant     LEVOFLOXACIN >=8 Resistant     NITROFURANTOIN <=16 Sensitive     TRIMETH/SULFA <=10 Sensitive     VANCOMYCIN 1 Sensitive     RIFAMPIN <=0.5 Sensitive     TETRACYCLINE 2 Sensitive     Coagulation Studies:  Recent Labs  06/14/15 2243  LABPROT 14.4  INR 1.10    Urinalysis: No results for input(s): COLORURINE, LABSPEC, PHURINE, GLUCOSEU, HGBUR, BILIRUBINUR, KETONESUR, PROTEINUR, UROBILINOGEN, NITRITE, LEUKOCYTESUR in the last 72 hours.  Invalid input(s): APPERANCEUR    Imaging: Dg Chest 2 View  06/14/2015   CLINICAL DATA:  Shortness of breath for 3 days  EXAM: CHEST - 2 VIEW  COMPARISON:  02/09/2015  FINDINGS: Cardiac shadow remains enlarged. The lungs are well aerated bilaterally. No focal infiltrate or sizable effusion is seen. Air is noted beneath the hemidiaphragms bilaterally suggestive of an intra-abdominal perforation. No bony abnormality is seen.  IMPRESSION: Findings of free air within the abdomen. CT of the abdomen and pelvis is recommended for further evaluation.  No acute abnormality is noted within the chest.  Critical Value/emergent results were called by telephone at the time of interpretation on 06/14/2015 at 11:02 pm to Dr. Daryel NovemberJONATHAN WILLIAMS , who verbally acknowledged these results.   Electronically Signed   By: Alcide CleverMark  Lukens M.D.   On: 06/14/2015 23:02   Ct Abdomen Pelvis W Contrast  06/14/2015   CLINICAL DATA:  Free air on recent chest x-ray  EXAM: CT ABDOMEN AND PELVIS WITH CONTRAST  TECHNIQUE: Multidetector CT imaging of the abdomen and pelvis was performed using the standard protocol following bolus administration of intravenous contrast.  CONTRAST:  80mL OMNIPAQUE IOHEXOL 300 MG/ML  SOLN  COMPARISON:  11/24/2014, 07/11/2014 .  FINDINGS: Lung bases demonstrate no focal infiltrate or sizable effusion.  The cardiac shadow remains enlarged.  The left lobe of the liver is somewhat hypertrophied related to some underlying cirrhotic change. Mottled appearance of the hepatic parenchyma is noted likely related to elevated central venous pressures and right heart failure. The inferior vena cava is prominent.  The spleen, adrenal glands and pancreas are within normal limits. The gallbladder has been surgically removed.  Diffuse aortoiliac calcifications are noted without aneurysmal dilatation. The native kidneys are small shrunken consistent with the given clinical history of end-stage renal disease. A peritoneal dialysis catheter is in place. Free fluid in the pelvis and possibly the free air noted on recent chest x-ray are related to the peritoneal dialysis. There is free air identified on this exam although no definitive changes to suggest perforation are noted. No diverticulitis is seen.  The osseous structures show no acute abnormality. The bladder is partially distended.  IMPRESSION: Free fluid and free air within the abdomen which given the patient's peritoneal dialysis catheter may simply be related to peritoneal dialysis therapy. No definitive findings to suggest bowel perforation are seen.  Mottled appearance to the liver which is stable over multiple previous exams consistent with elevated central venous pressures  Shrunken native kidneys consistent with the given clinical history of end-stage renal disease.   Electronically Signed   By: Alcide CleverMark  Lukens M.D.   On: 06/14/2015 23:57     Medications:     . aspirin EC  81 mg Oral Daily  . calcium acetate  2,001 mg Oral TID WC  . [START ON 06/16/2015] cinacalcet  150 mg Oral Q breakfast  . furosemide  40 mg Oral Daily  . heparin  5,000 Units Subcutaneous 3 times per day  . metoprolol succinate  25 mg Oral Daily  . sodium chloride  3 mL Intravenous Q12H  . tiotropium  18 mcg Inhalation Daily   acetaminophen **OR** acetaminophen, ALPRAZolam, cyclobenzaprine,  HYDROcodone-acetaminophen, morphine injection, nitroGLYCERIN  Assessment/ Plan:  54 y.o. female with medical problems of of hypertension, hyperlipidemia, vitamin D deficiency, tobacco abuse, edema, cholecystectomy,hysterectomy, ESRD secondary to FSGS, SHPTH, severe mitral stenosis. Liver cirrhosis  1. ESRD on PD 1600 cc x 6 dwells with last fill 2. Abdominal / femoral hernia - will discuss with radio;ogy about CT peritoneography 3. Abdominal pain (RLQ) - ? Constipation vs Appendicitis although no fever or high WBC 4. AOCKD- will resume SQ EPO 5. SHPTH - monitor phos. Phoslo. Sensipar 150 mg with meals (outpatient dose)   LOS:  Dorothy Long 7/1/201610:11 AM

## 2015-06-15 NOTE — H&P (Signed)
[place] [facility] [place] NAME: Dorothy Long    MR#:  [id number]  DATE OF BIRTH:  06/18/1961   DATE OF ADMISSION:  06/14/2015  PRIMARY CARE PHYSICIAN: [name], MD   REQUESTING/REFERRING PHYSICIAN: [name]  CHIEF COMPLAINT:   Chief Complaint  Patient presents with  . Abdominal Pain  . Shortness of Breath    HISTORY OF PRESENT ILLNESS:  Dorothy Long  is a 54 y.o. female with a known history of end stage renal disease on peritoneal dialysis, COPD and non-O2 dependent presenting with chest pain/shortness of breath/abdominal pain. Patient complains of a myriad symptoms spanning the course of 2-3 weeks starting with abdominal pain, periumbilical, "pain" 6-7 out of 10 no worsening/relieving factors. Also complaining of shortness of breath 2-3 day total duration, nonproductive cough which is unchanged from baseline. Now also complaining of chest pain retrosternal location, nonradiating, "pain", no worsening/relieving factors   PAST MEDICAL HISTORY:   Past Medical History  Diagnosis Date  . Hypertension   . Hyperlipidemia   . COPD (chronic obstructive pulmonary disease)   . Anemia   . Mitral valve stenosis, severe   . CHF (congestive heart failure)   . Chronic kidney disease   . Acute on chronic renal failure   . Diastolic congestive heart failure   . A-fib   . Dialysis patient   . Diabetes mellitus without complication     PAST SURGICAL HISTORY:   Past Surgical History  Procedure Laterality Date  . Abdominal hysterectomy    . Cholecystectomy    . Knee surgery Right   . Foot surgery Bilateral   . Av fistula placement      SOCIAL HISTORY:   History  Substance Use Topics  . Smoking status: Current Some Day Smoker -- 0.25 packs/day for 20 years    Types: Cigarettes  . Smokeless tobacco: Never Used  . Alcohol Use: No     Comment: occasional    FAMILY HISTORY:   Family History  Problem  Relation Age of Onset  . Cancer Mother     colon  . Heart disease Father   . Hypertension Father     DRUG ALLERGIES:   Allergies  Allergen Reactions  . Ciprocinonide [Fluocinolone]   . Demerol [Meperidine]   . Diflucan [Fluconazole]   . Flagyl [Metronidazole]   . Levaquin [Levofloxacin In D5w] Other (See Comments)    Numbness in legs  . Morphine And Related   . Tetracyclines & Related   . Valium [Diazepam]     REVIEW OF SYSTEMS:  REVIEW OF SYSTEMS:  CONSTITUTIONAL: Denies fevers, positive chills, denies fatigue, weakness.  EYES: Denies blurred vision, double vision, or eye pain.  EARS, NOSE, THROAT: Denies tinnitus, ear pain, hearing loss.  RESPIRATORY: denies cough, shortness of breath, wheezing  CARDIOVASCULAR: Positive chest pain, denies palpitations, edema.  GASTROINTESTINAL: Denies nausea, vomiting, diarrhea, positive abdominal pain.  GENITOURINARY: Denies dysuria, hematuria.  ENDOCRINE: Denies nocturia or thyroid problems. HEMATOLOGIC AND LYMPHATIC: Denies easy bruising or bleeding.  SKIN: Denies rash or lesions.  MUSCULOSKELETAL: Denies pain in neck, back, shoulder, knees, hips, or further arthritic symptoms.  NEUROLOGIC: Denies paralysis, paresthesias.  PSYCHIATRIC: Denies anxiety or depressive symptoms. Otherwise full review of systems performed by me is negative.   MEDICATIONS AT HOME:   Prior to Admission medications   Medication Sig Start Date End Date Taking? Authorizing Provider  acetaminophen (TYLENOL) 325 MG tablet Take 325 mg by mouth every 4 (  four) hours as needed.     Historical Provider, MD  albuterol (VENTOLIN HFA) 108 (90 BASE) MCG/ACT inhaler Inhale into the lungs every 6 (six) hours as needed for wheezing or shortness of breath.    Historical Provider, MD  ALPRAZolam Duanne Moron) 0.25 MG tablet Take 0.25 mg by mouth 3 (three) times daily as needed for sleep.    Historical Provider, MD  Calcium Acetate 667 MG TABS Take 3 capsules by mouth 3 (three)  times daily.    Historical Provider, MD  cinacalcet (SENSIPAR) 30 MG tablet Take 30 mg by mouth daily.    Historical Provider, MD  cyclobenzaprine (FLEXERIL) 10 MG tablet Take 1 tablet (10 mg total) by mouth 3 (three) times daily as needed for muscle spasms. 05/21/15   [name], MD  diphenhydrAMINE (BENADRYL) 25 MG tablet Take 25 mg by mouth every other day. MON, WED, FRI BEFORE DIALYSIS    Historical Provider, MD  fluconazole (DIFLUCAN) 150 MG tablet Take 1 tablet (150 mg total) by mouth daily. 03/12/15   [name], MD  fluticasone (FLONASE) 50 MCG/ACT nasal spray Place 2 sprays into both nostrils daily. 03/12/15   [name], MD  furosemide (LASIX) 40 MG tablet Take 1 tablet (40 mg total) by mouth daily. 01/16/15   [name], MD  HYDROcodone-acetaminophen (NORCO/VICODIN) 5-325 MG per tablet Take 1 tablet by mouth every 6 (six) hours as needed for moderate pain.    Historical Provider, MD  metolazone (ZAROXOLYN) 5 MG tablet Take 1 tablet (5 mg total) by mouth daily as needed. 05/15/14   [name], MD  metoprolol succinate (TOPROL-XL) 25 MG 24 hr tablet TAKE 1 TABLET (25 MG TOTAL) BY MOUTH DAILY. 03/05/15   [name], MD  mometasone (ELOCON) 0.1 % cream Apply 1 application topically daily. To ear canal 11/06/14   [name], MD  predniSONE (DELTASONE) 10 MG tablet 6 tablets on Day 1 , then reduce by 1 tablet daily until gone 05/21/15   [name], MD  tiotropium (SPIRIVA HANDIHALER) 18 MCG inhalation capsule Place 1 capsule (18 mcg total) into inhaler and inhale daily. 05/29/14   [name], MD      VITAL SIGNS:  Blood pressure 100/72, pulse 94, temperature 98.7 F (37.1 C), temperature source Oral, resp. rate 15, height 5' 10"  (1.778 m), weight 114 lb (51.71 kg), SpO2 98 %.  PHYSICAL EXAMINATION:  VITAL SIGNS: Filed Vitals:   06/15/15 0300  BP: 100/72  Pulse:   Temp:   Resp: 15   GENERAL:54 y.o.female currently in no acute distress, chronically  ill-appearing.  HEAD: Normocephalic, atraumatic.  EYES: Pupils equal, round, reactive to light. Extraocular muscles intact. No scleral icterus.  MOUTH: Moist mucosal membrane. Dentition poor. No abscess noted.  EAR, NOSE, THROAT: Clear without exudates. No external lesions.  NECK: Supple. No thyromegaly. No nodules. No JVD.  PULMONARY: Clear to ascultation, without wheeze rails or rhonci. No use of accessory muscles, Good respiratory effort. good air entry bilaterally CHEST: Nontender to palpation.  CARDIOVASCULAR: S1 and S2. Regular rate and rhythm. 2/6 systolic ejection murmurs, rubs, or gallops. No edema. Pedal pulses 2+ bilaterally.  GASTROINTESTINAL: Soft, nontender, nondistended. No masses. Peritoneal dialysis catheter in place without surrounding erythema/discharge Positive bowel sounds. No hepatosplenomegaly.  MUSCULOSKELETAL: No swelling, clubbing, or edema. Range of motion full in all extremities.  NEUROLOGIC: Cranial nerves II through XII are intact. No gross focal neurological deficits. Sensation intact. Reflexes intact.  SKIN: No  ulceration, lesions, rashes, or cyanosis. Skin warm and dry. Turgor intact.  PSYCHIATRIC: Mood, affect within normal limits. The patient is awake, alert and oriented x 3. Insight, judgment intact.    LABORATORY PANEL:   CBC  Recent Labs Lab 06/14/15 2243  WBC 7.7  HGB 9.5*  HCT 29.4*  PLT 178   ------------------------------------------------------------------------------------------------------------------  Chemistries   Recent Labs Lab 06/14/15 2243  NA 134*  K 3.1*  CL 92*  CO2 22  GLUCOSE 87  BUN 69*  CREATININE 9.41*  CALCIUM 7.7*  AST 20  ALT 13*  ALKPHOS 130*  BILITOT 0.5   ------------------------------------------------------------------------------------------------------------------  Cardiac Enzymes  Recent Labs Lab 06/14/15 2243  TROPONINI 0.10*    ------------------------------------------------------------------------------------------------------------------  RADIOLOGY:  Dg Chest 2 View  06/14/2015   CLINICAL DATA:  Shortness of breath for 3 days  EXAM: CHEST - 2 VIEW  COMPARISON:  02/09/2015  FINDINGS: Cardiac shadow remains enlarged. The lungs are well aerated bilaterally. No focal infiltrate or sizable effusion is seen. Air is noted beneath the hemidiaphragms bilaterally suggestive of an intra-abdominal perforation. No bony abnormality is seen.  IMPRESSION: Findings of free air within the abdomen. CT of the abdomen and pelvis is recommended for further evaluation.  No acute abnormality is noted within the chest.  Critical Value/emergent results were called by telephone at the time of interpretation on 06/14/2015 at 11:02 pm to Dr. Lenise Arena , who verbally acknowledged these results.   Electronically Signed   By: Inez Catalina M.D.   On: 06/14/2015 23:02   Ct Abdomen Pelvis W Contrast  06/14/2015   CLINICAL DATA:  Free air on recent chest x-ray  EXAM: CT ABDOMEN AND PELVIS WITH CONTRAST  TECHNIQUE: Multidetector CT imaging of the abdomen and pelvis was performed using the standard protocol following bolus administration of intravenous contrast.  CONTRAST:  32m OMNIPAQUE IOHEXOL 300 MG/ML  SOLN  COMPARISON:  11/24/2014, 07/11/2014 .  FINDINGS: Lung bases demonstrate no focal infiltrate or sizable effusion. The cardiac shadow remains enlarged.  The left lobe of the liver is somewhat hypertrophied related to some underlying cirrhotic change. Mottled appearance of the hepatic parenchyma is noted likely related to elevated central venous pressures and right heart failure. The inferior vena cava is prominent.  The spleen, adrenal glands and pancreas are within normal limits. The gallbladder has been surgically removed.  Diffuse aortoiliac calcifications are noted without aneurysmal dilatation. The native kidneys are small shrunken consistent  with the given clinical history of end-stage renal disease. A peritoneal dialysis catheter is in place. Free fluid in the pelvis and possibly the free air noted on recent chest x-ray are related to the peritoneal dialysis. There is free air identified on this exam although no definitive changes to suggest perforation are noted. No diverticulitis is seen.  The osseous structures show no acute abnormality. The bladder is partially distended.  IMPRESSION: Free fluid and free air within the abdomen which given the patient's peritoneal dialysis catheter may simply be related to peritoneal dialysis therapy. No definitive findings to suggest bowel perforation are seen.  Mottled appearance to the liver which is stable over multiple previous exams consistent with elevated central venous pressures  Shrunken native kidneys consistent with the given clinical history of end-stage renal disease.   Electronically Signed   By: MInez CatalinaM.D.   On: 06/14/2015 23:57    EKG:   Orders placed or performed in visit on 02/14/15  . EKG 12-Lead    IMPRESSION AND PLAN:  54 year old African female history of end-stage renal disease on dialysis presenting with chest pain, shortness of breath, abdominal pain.  1. Chest pain, central: Initiate aspirin statin therapy, place on telemetry, trend cardiac enzymes 3 likely elevation of troponin due to dialysis not actual cardiac symptoms would hold on actually anticoagulating unless next Troponin is markedly elevated  2. End-stage renal disease on peritoneal dialysis: Consult nephrology continuation dialysis 3. COPD, not in acute exacerbation: Continue home medications 4. Venous thromboembolism prophylactic: Heparin subcutaneous    All the records are reviewed and case discussed with ED provider. Management plans discussed with the patient, family and they are in agreement.  CODE STATUS: Full code  TOTAL TIME TAKING CARE OF THIS PATIENT: 35 minutes.    Brayley Mackowiak,  Karenann Cai.D on 06/15/2015 at 3:28 AM  Between 7am to 6pm - Pager - [phone]  After 6pm: House Pager: - [facility]  Office  [phone]  CC: Primary care physician; [name], MD

## 2015-06-15 NOTE — Progress Notes (Signed)
[facility] [place] [facility]   PATIENT NAME: Dorothy Long    MR#:  [id number]  DATE OF BIRTH:  08/09/1961  SUBJECTIVE:  CHIEF COMPLAINT:   Chief Complaint  Patient presents with  . Abdominal Pain  . Shortness of Breath   Still complaining of some abdominal pain, non-specific chest pain.    REVIEW OF SYSTEMS:    Review of Systems  Constitutional: Negative for fever and chills.  HENT: Negative for congestion and tinnitus.   Eyes: Negative for blurred vision and double vision.  Respiratory: Negative for cough, shortness of breath and wheezing.   Cardiovascular: Positive for chest pain. Negative for orthopnea and PND.  Gastrointestinal: Positive for abdominal pain. Negative for nausea, vomiting and diarrhea.  Genitourinary: Negative for dysuria and hematuria.  Neurological: Negative for dizziness, sensory change and focal weakness.  All other systems reviewed and are negative.   Nutrition: Renal diet w/ fluid restriction Tolerating Diet: Yes Tolerating PT:  Ambulatory.   DRUG ALLERGIES:   Allergies  Allergen Reactions  . Ciprocinonide [Fluocinolone]   . Demerol [Meperidine]   . Diflucan [Fluconazole]   . Flagyl [Metronidazole]   . Levaquin [Levofloxacin In D5w] Other (See Comments)    Numbness in legs  . Morphine And Related   . Tetracyclines & Related   . Valium [Diazepam]     VITALS:  Blood pressure 96/67, pulse 75, temperature 98.7 F (37.1 C), temperature source Oral, resp. rate 18, height 5\' 10"  (1.778 m), weight 52.164 kg (115 lb), SpO2 99 %.  PHYSICAL EXAMINATION:   Physical Exam  GENERAL:  54 y.o.-year-old patient lying in the bed with no acute distress.  EYES: Pupils equal, round, reactive to light and accommodation. No scleral icterus. Extraocular muscles intact.  HEENT: Head atraumatic, normocephalic. Oropharynx and nasopharynx clear.  NECK:  Supple, no jugular venous distention. No thyroid enlargement, no  tenderness.  LUNGS: Normal breath sounds bilaterally, no wheezing, rales, rhonchi. No use of accessory muscles of respiration.  CARDIOVASCULAR: S1, S2 normal. II/VI SEM at RSB, No rubs, gallops, clicks.  ABDOMEN: Soft, Tender in lower abdomen.  No rebound, rigidity.  + PD Cath with no acute drainage. Bowel sounds present. No organomegaly or mass.  EXTREMITIES: No cyanosis, clubbing or edema b/l.    NEUROLOGIC: Cranial nerves II through XII are intact. No focal Motor or sensory deficits b/l.   PSYCHIATRIC: The patient is alert and oriented x 3. Good affect.  SKIN: No obvious rash, lesion, or ulcer.    LABORATORY PANEL:   CBC  Recent Labs Lab 06/15/15 0424  WBC 6.7  HGB 8.8*  HCT 27.2*  PLT 161   ------------------------------------------------------------------------------------------------------------------  Chemistries   Recent Labs Lab 06/14/15 2243 06/15/15 0424  NA 134*  --   K 3.1*  --   CL 92*  --   CO2 22  --   GLUCOSE 87  --   BUN 69*  --   CREATININE 9.41* 9.15*  CALCIUM 7.7*  --   AST 20  --   ALT 13*  --   ALKPHOS 130*  --   BILITOT 0.5  --    ------------------------------------------------------------------------------------------------------------------  Cardiac Enzymes  Recent Labs Lab 06/15/15 0424  TROPONINI 0.11*   ------------------------------------------------------------------------------------------------------------------  RADIOLOGY:  Dg Chest 2 View  06/14/2015   CLINICAL DATA:  Shortness of breath for 3 days  EXAM: CHEST - 2 VIEW  COMPARISON:  02/09/2015  FINDINGS: Cardiac shadow remains enlarged. The lungs are well aerated bilaterally. No  focal infiltrate or sizable effusion is seen. Air is noted beneath the hemidiaphragms bilaterally suggestive of an intra-abdominal perforation. No bony abnormality is seen.  IMPRESSION: Findings of free air within the abdomen. CT of the abdomen and pelvis is recommended for further evaluation.  No  acute abnormality is noted within the chest.  Critical Value/emergent results were called by telephone at the time of interpretation on 06/14/2015 at 11:02 pm to Dr. Daryel NovemberJONATHAN WILLIAMS , who verbally acknowledged these results.   Electronically Signed   By: Alcide CleverMark  Lukens M.D.   On: 06/14/2015 23:02   Ct Abdomen Pelvis W Contrast  06/14/2015   CLINICAL DATA:  Free air on recent chest x-ray  EXAM: CT ABDOMEN AND PELVIS WITH CONTRAST  TECHNIQUE: Multidetector CT imaging of the abdomen and pelvis was performed using the standard protocol following bolus administration of intravenous contrast.  CONTRAST:  80mL OMNIPAQUE IOHEXOL 300 MG/ML  SOLN  COMPARISON:  11/24/2014, 07/11/2014 .  FINDINGS: Lung bases demonstrate no focal infiltrate or sizable effusion. The cardiac shadow remains enlarged.  The left lobe of the liver is somewhat hypertrophied related to some underlying cirrhotic change. Mottled appearance of the hepatic parenchyma is noted likely related to elevated central venous pressures and right heart failure. The inferior vena cava is prominent.  The spleen, adrenal glands and pancreas are within normal limits. The gallbladder has been surgically removed.  Diffuse aortoiliac calcifications are noted without aneurysmal dilatation. The native kidneys are small shrunken consistent with the given clinical history of end-stage renal disease. A peritoneal dialysis catheter is in place. Free fluid in the pelvis and possibly the free air noted on recent chest x-ray are related to the peritoneal dialysis. There is free air identified on this exam although no definitive changes to suggest perforation are noted. No diverticulitis is seen.  The osseous structures show no acute abnormality. The bladder is partially distended.  IMPRESSION: Free fluid and free air within the abdomen which given the patient's peritoneal dialysis catheter may simply be related to peritoneal dialysis therapy. No definitive findings to suggest  bowel perforation are seen.  Mottled appearance to the liver which is stable over multiple previous exams consistent with elevated central venous pressures  Shrunken native kidneys consistent with the given clinical history of end-stage renal disease.   Electronically Signed   By: Alcide CleverMark  Lukens M.D.   On: 06/14/2015 23:57     ASSESSMENT AND PLAN:   54 yo female w/ hx of ESRD on PD, HTN, Hyperlipidemia, COPD, CHF, DM who presented to the hospital w/ abdominal/chest pain.   1. Abdominal pain - etiology unclear presently.  CT Abd/pelvis showing no acute pathology.  - ?? PD fluid infection and will await Nephro eval.  - afebrile, WBC count normal.  Await UA to r/o UTI.  - cont. Supportive care for now.   2. Chest Pain - atypical and likely non-cardiac in nature.  - troponin's mildly elevated and likely due to CKD.  - cont. ASA, B-blocker.  Will d/c tele once 3rd troponin is (-).    3. ESRD on PD - Nephro consult and await input.   4. COPD w/ ongoing tobacco abuse - no acute exacerbation.  - cont. Spiriva.    5. Secondary Hyperparathyroidism - cont. Sensipar.   6. Anxiety - PRN Xanax.     All the records are reviewed and case discussed with Care Management/Social Workerr. Management plans discussed with the patient, family and they are in agreement.  CODE STATUS: Full  DVT Prophylaxis: Heparin SQ  TOTAL TIME TAKING CARE OF THIS PATIENT: 30 minutes.   POSSIBLE D/C tomorrow a.m.   Houston Siren M.D on 06/15/2015 at 10:07 AM  Between 7am to 6pm - Pager - [phone]  After 6pm go to [email or web address] - password [email or web address] [facility]  [facility]  Office  [phone]  CC: Primary care physician; Sherlene Shams, MD

## 2015-06-16 MED ORDER — HYDROCODONE-ACETAMINOPHEN 5-325 MG PO TABS: 1.0000 | ORAL_TABLET | Freq: Four times a day (QID) | ORAL | Status: DC | PRN

## 2015-06-16 MED ORDER — ALPRAZOLAM 0.25 MG PO TABS: 0.2500 mg | ORAL_TABLET | Freq: Three times a day (TID) | ORAL | Status: DC | PRN

## 2015-06-16 MED ORDER — ONDANSETRON HCL 4 MG PO TABS: 4.0000 mg | ORAL_TABLET | Freq: Four times a day (QID) | ORAL | Status: DC | PRN

## 2015-06-16 NOTE — Discharge Summary (Signed)
[place] [facility] [place] NAME: Dorothy Long    MR#:  [id number]  DATE OF BIRTH:  [birth year]  DATE OF ADMISSION:  06/14/2015 ADMITTING PHYSICIAN: Lytle Butte, MD  DATE OF DISCHARGE: 06/16/2015  1:00 PM  PRIMARY CARE PHYSICIAN: [name], MD    ADMISSION DIAGNOSIS:  abd pain  DISCHARGE DIAGNOSIS:  Principal Problem:   Chest pain   SECONDARY DIAGNOSIS:   Past Medical History  Diagnosis Date  . Hypertension   . Hyperlipidemia   . COPD (chronic obstructive pulmonary disease)   . Anemia   . Mitral valve stenosis, severe   . CHF (congestive heart failure)   . Chronic kidney disease   . Acute on chronic renal failure   . Diastolic congestive heart failure   . A-fib   . Dialysis patient   . Diabetes mellitus without complication     HOSPITAL COURSE:   54 year old female with past medical history of end-stage renal disease on peritoneal dialysis, hypertension, hyperlipidemia, COPD, chronic anemia, chronic diastolic CHF, atrial fibrillation who presented to the hospital due to abdominal/chest pain.  1. Abdominal pain - etiology unclear presently. CT Abd/pelvis showing no acute pathology other than inguinal hernia on the left side.  -Patient had urinalysis which was negative. She was afebrile, with a normal white cell count. -There is no evidence of peritonitis based on exam. Discussed the case with Dr. Delana Meyer over the phone note that her CAT scan and did not think that the peritoneal dialysis catheter wasn't in proper position. -Patient's pain had improved since admission and she was tolerating by mouth well and therefore discharged home. -Patient's CT scan did show evidence of enlarged liver and possible liver cirrhosis and therefore she was advised to follow up with gastroenterology as an outpatient. Patient was given phone number to contact Dr. Lucilla Lame.    2. Chest Pain - atypical and likely non-cardiac in nature.   - troponin's mildly elevated and likely due to CKD.  - cont. ASA, B-blocker.   3. ESRD on PD - she was seen by nephrology and had peritoneal dialysis done while in the hospital. She tolerated it well  4. COPD w/ ongoing tobacco abuse - no acute exacerbation.   5. Secondary Hyperparathyroidism - cont. Sensipar.   6. Anxiety - PRN Xanax.    DISCHARGE CONDITIONS:   Stable  CONSULTS OBTAINED:  Treatment Team:  Murlean Iba, MD  DRUG ALLERGIES:   Allergies  Allergen Reactions  . Ciprocinonide [Fluocinolone]   . Demerol [Meperidine]   . Diflucan [Fluconazole]   . Flagyl [Metronidazole]   . Levaquin [Levofloxacin In D5w] Other (See Comments)    Numbness in legs  . Morphine And Related Nausea And Vomiting  . Tetracyclines & Related   . Valium [Diazepam]     DISCHARGE MEDICATIONS:   Discharge Medication List as of 06/16/2015 12:20 PM    START taking these medications   Details  ondansetron (ZOFRAN) 4 MG tablet Take 1 tablet (4 mg total) by mouth every 6 (six) hours as needed for nausea or vomiting., Starting 06/16/2015, Until Discontinued, Print      CONTINUE these medications which have CHANGED   Details  ALPRAZolam (XANAX) 0.25 MG tablet Take 1 tablet (0.25 mg total) by mouth 3 (three) times daily as needed for sleep., Starting 06/16/2015, Until Discontinued, Print    !! HYDROcodone-acetaminophen (NORCO/VICODIN) 5-325 MG per tablet Take 1 tablet by mouth every 6 (six) hours as needed for  moderate pain., Starting 06/16/2015, Until Discontinued, Print     !! - Potential duplicate medications found. Please discuss with provider.    CONTINUE these medications which have NOT CHANGED   Details  acetaminophen (TYLENOL) 325 MG tablet Take 325 mg by mouth every 4 (four) hours as needed for mild pain. , Until Discontinued, Historical Med    albuterol (VENTOLIN HFA) 108 (90 BASE) MCG/ACT inhaler Inhale 1-2 puffs into the lungs every 6 (six) hours as needed for wheezing or  shortness of breath. , Until Discontinued, Historical Med    Calcium Acetate 667 MG TABS Take 3 capsules by mouth 3 (three) times daily., Until Discontinued, Historical Med    cinacalcet (SENSIPAR) 90 MG tablet Take 180 mg by mouth daily. With the largest meal of the day, Until Discontinued, Historical Med    cyclobenzaprine (FLEXERIL) 10 MG tablet Take 1 tablet (10 mg total) by mouth 3 (three) times daily as needed for muscle spasms., Starting 05/21/2015, Until Discontinued, Normal    diphenhydrAMINE (BENADRYL) 25 MG tablet Take 25 mg by mouth every other day. MON, WED, FRI BEFORE DIALYSIS, Until Discontinued, Historical Med    !! HYDROcodone-acetaminophen (NORCO/VICODIN) 5-325 MG per tablet Take 1 tablet by mouth every 4 (four) hours as needed for moderate pain. , Until Discontinued, Historical Med    metoprolol succinate (TOPROL-XL) 25 MG 24 hr tablet TAKE 1 TABLET (25 MG TOTAL) BY MOUTH DAILY., Normal     !! - Potential duplicate medications found. Please discuss with provider.    STOP taking these medications     furosemide (LASIX) 40 MG tablet      tiotropium (SPIRIVA HANDIHALER) 18 MCG inhalation capsule          DISCHARGE INSTRUCTIONS:   DIET:  Cardiac diet and Renal diet  DISCHARGE CONDITION:  Stable  ACTIVITY:  Activity as tolerated  OXYGEN:  Home Oxygen: No.   Oxygen Delivery: room air  DISCHARGE LOCATION:  home   If you experience worsening of your admission symptoms, develop shortness of breath, life threatening emergency, suicidal or homicidal thoughts you must seek medical attention immediately by calling 911 or calling your MD immediately  if symptoms less severe.  You Must read complete instructions/literature along with all the possible adverse reactions/side effects for all the Medicines you take and that have been prescribed to you. Take any new Medicines after you have completely understood and accpet all the possible adverse reactions/side effects.    Please note  You were cared for by a hospitalist during your hospital stay. If you have any questions about your discharge medications or the care you received while you were in the hospital after you are discharged, you can call the unit and asked to speak with the hospitalist on call if the hospitalist that took care of you is not available. Once you are discharged, your primary care physician will handle any further medical issues. Please note that NO REFILLS for any discharge medications will be authorized once you are discharged, as it is imperative that you return to your primary care physician (or establish a relationship with a primary care physician if you do not have one) for your aftercare needs so that they can reassess your need for medications and monitor your lab values.     Today   Pt had one episode of N/V which improved w/ anti-emetics.  Abdominal pain improved. No fever.    VITAL SIGNS:  Blood pressure 106/73, pulse 88, temperature 98 F (36.7 C), temperature  source Oral, resp. rate 18, height 5' 10"  (1.778 m), weight 54.477 kg (120 lb 1.6 oz), SpO2 100 %.  I/O:   Intake/Output Summary (Last 24 hours) at 06/16/15 1502 Last data filed at 06/16/15 0840  Gross per 24 hour  Intake  11200 ml  Output      0 ml  Net  11200 ml    PHYSICAL EXAMINATION:  GENERAL:  54 y.o.-year-old patient lying in the bed with no acute distress.  EYES: Pupils equal, round, reactive to light and accommodation. No scleral icterus. Extraocular muscles intact.  HEENT: Head atraumatic, normocephalic. Oropharynx and nasopharynx clear.  NECK:  Supple, no jugular venous distention. No thyroid enlargement, no tenderness.  LUNGS: Normal breath sounds bilaterally, no wheezing, rales,rhonchi. No use of accessory muscles of respiration.  CARDIOVASCULAR: S1, S2 RRR. No murmurs, rubs, or gallops.  ABDOMEN: Soft, non-tender, non-distended. Bowel sounds present. No organomegaly or mass. +PD cath in place  with no acute drainage.   EXTREMITIES: No pedal edema, cyanosis, or clubbing.  NEUROLOGIC: Cranial nerves II through XII are intact. No focal motor or sensory defecits b/l.  PSYCHIATRIC: The patient is alert and oriented x 3. Good affect.  SKIN: No obvious rash, lesion, or ulcer.   DATA REVIEW:   CBC  Recent Labs Lab 06/15/15 0424  WBC 6.7  HGB 8.8*  HCT 27.2*  PLT 161    Chemistries   Recent Labs Lab 06/14/15 2243 06/15/15 0424  NA 134*  --   K 3.1*  --   CL 92*  --   CO2 22  --   GLUCOSE 87  --   BUN 69*  --   CREATININE 9.41* 9.15*  CALCIUM 7.7*  --   AST 20  --   ALT 13*  --   ALKPHOS 130*  --   BILITOT 0.5  --     Cardiac Enzymes  Recent Labs Lab 06/15/15 1545  TROPONINI 0.10*    Microbiology Results  Results for orders placed or performed during the hospital encounter of 06/14/15  Body fluid culture     Status: None (Preliminary result)   Collection Time: 06/15/15  1:36 PM  Result Value Ref Range Status   Specimen Description PERITONEAL  Final   Special Requests Normal  Final   Gram Stain FEW WBC SEEN NO ORGANISMS SEEN   Final   Culture NO GROWTH < 24 HOURS  Final   Report Status PENDING  Incomplete    RADIOLOGY:  Ct Abdomen Pelvis Wo Contrast  06/15/2015   CLINICAL DATA:  female with a known history of end stage renal disease on peritoneal dialysis, COPD and non-O2 dependent presenting with chest pain/shortness of breath/abdominal pain.Patient complains of a myriad symptoms spanning the course of 2-3 weeks starting with abdominal pain, periumbilical, "pain" 6-7 out of 10 no worsening/relieving factors. Also complaining of shortness of breath 2-3 day total duration, nonproductive cough which is unchanged from baseline. Now also complaining of chest pain retrosternal location, nonradiating, "pain", no worsening/relieving factors  EXAM: CT ABDOMEN AND PELVIS WITHOUT CONTRAST  TECHNIQUE: Multidetector CT imaging of the abdomen and pelvis was performed  following the standard protocol without IV contrast.  COMPARISON:  06/14/2015.  FINDINGS: Peritoneal cavity: Contrast extends throughout the peritoneal cavity. There is a small amount of contrast that lies along the medial margin of the left inguinal canal consistent with a small direct inguinal hernia. This was seen is an area of irregular fluid attenuation on the previous day's CT. The presence  of contrast within this confirms that it communicates with the peritoneal cavity. There is no other evidence of a hernia. Small amount of nondependent free intraperitoneal air is stable from the previous day's study.  Lung bases: Moderate cardiomegaly. Mild subsegmental lung base atelectasis. No pulmonary edema. Minimal right pleural effusion.  Liver: Morphologic changes consistent with cirrhosis. No mass or focal lesion.  Gallbladder surgically absent.  No bile duct dilation.  Spleen, pancreas, adrenal glands:  Unremarkable.  Kidneys, ureters, bladder: Marked renal atrophy. No renal mass or hydronephrosis. Ureters not visualized but are nondilated. Bladder is unremarkable.  Uterus and adnexa:  Uterus surgically absent.  No pelvic masses.  Lymph nodes:  No pathologically enlarged lymph nodes.  Gastrointestinal: Colon and small bowel are unremarkable. Normal appendix visualized.  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  IMPRESSION: 1. Small left inguinal hernia, which appears to be a direct hernia, communicating with peritoneal cavity. No other abdominal wall hernia. 2. No acute findings.   Electronically Signed   By: Lajean Manes M.D.   On: 06/15/2015 14:16   Dg Chest 2 View  06/14/2015   CLINICAL DATA:  Shortness of breath for 3 days  EXAM: CHEST - 2 VIEW  COMPARISON:  02/09/2015  FINDINGS: Cardiac shadow remains enlarged. The lungs are well aerated bilaterally. No focal infiltrate or sizable effusion is seen. Air is noted beneath the hemidiaphragms bilaterally suggestive of an  intra-abdominal perforation. No bony abnormality is seen.  IMPRESSION: Findings of free air within the abdomen. CT of the abdomen and pelvis is recommended for further evaluation.  No acute abnormality is noted within the chest.  Critical Value/emergent results were called by telephone at the time of interpretation on 06/14/2015 at 11:02 pm to Dr. Lenise Arena , who verbally acknowledged these results.   Electronically Signed   By: Inez Catalina M.D.   On: 06/14/2015 23:02   Ct Abdomen Pelvis W Contrast  06/14/2015   CLINICAL DATA:  Free air on recent chest x-ray  EXAM: CT ABDOMEN AND PELVIS WITH CONTRAST  TECHNIQUE: Multidetector CT imaging of the abdomen and pelvis was performed using the standard protocol following bolus administration of intravenous contrast.  CONTRAST:  36m OMNIPAQUE IOHEXOL 300 MG/ML  SOLN  COMPARISON:  11/24/2014, 07/11/2014 .  FINDINGS: Lung bases demonstrate no focal infiltrate or sizable effusion. The cardiac shadow remains enlarged.  The left lobe of the liver is somewhat hypertrophied related to some underlying cirrhotic change. Mottled appearance of the hepatic parenchyma is noted likely related to elevated central venous pressures and right heart failure. The inferior vena cava is prominent.  The spleen, adrenal glands and pancreas are within normal limits. The gallbladder has been surgically removed.  Diffuse aortoiliac calcifications are noted without aneurysmal dilatation. The native kidneys are small shrunken consistent with the given clinical history of end-stage renal disease. A peritoneal dialysis catheter is in place. Free fluid in the pelvis and possibly the free air noted on recent chest x-ray are related to the peritoneal dialysis. There is free air identified on this exam although no definitive changes to suggest perforation are noted. No diverticulitis is seen.  The osseous structures show no acute abnormality. The bladder is partially distended.  IMPRESSION: Free  fluid and free air within the abdomen which given the patient's peritoneal dialysis catheter may simply be related to peritoneal dialysis therapy. No definitive findings to suggest bowel perforation are seen.  Mottled appearance to the liver which is stable over multiple previous exams consistent  with elevated central venous pressures  Shrunken native kidneys consistent with the given clinical history of end-stage renal disease.   Electronically Signed   By: Inez Catalina M.D.   On: 06/14/2015 23:57      Management plans discussed with the patient, family and they are in agreement.  CODE STATUS:     Code Status Orders        Start     Ordered   06/15/15 0257  Full code   Continuous     06/15/15 0257      TOTAL TIME TAKING CARE OF THIS PATIENT: 40 minutes.    Henreitta Leber M.D on 06/16/2015 at 3:02 PM  Between 7am to 6pm - Pager - [phone]  After 6pm go to [email or web address] - password [email or web address] [facility]  Office  [phone]  CC: Primary care physician; [name], MD

## 2015-06-16 NOTE — Progress Notes (Signed)
Pt is a&o, VSS, pt did have an episode of N&V which was decreased by Zofran. Orders to D/c pt home. Prescriptions and discharge instructions given to pt and husband with verbal acknowledgment of understanding. Pt to be escorted off unit via wheelchair by nursing.

## 2015-06-16 NOTE — Discharge Instructions (Signed)
° °  DIET:  °Cardiac diet and Renal diet ° °DISCHARGE CONDITION:  °Stable ° °ACTIVITY:  °Activity as tolerated ° °OXYGEN:  °Home Oxygen: No. °  °Oxygen Delivery: room air ° °DISCHARGE LOCATION:  °home  ° °If you experience worsening of your admission symptoms, develop shortness of breath, life threatening emergency, suicidal or homicidal thoughts you must seek medical attention immediately by calling 911 or calling your MD immediately  if symptoms less severe. ° °You Must read complete instructions/literature along with all the possible adverse reactions/side effects for all the Medicines you take and that have been prescribed to you. Take any new Medicines after you have completely understood and accpet all the possible adverse reactions/side effects.  ° °Please note ° °You were cared for by a hospitalist during your hospital stay. If you have any questions about your discharge medications or the care you received while you were in the hospital after you are discharged, you can call the unit and asked to speak with the hospitalist on call if the hospitalist that took care of you is not available. Once you are discharged, your primary care physician will handle any further medical issues. Please note that NO REFILLS for any discharge medications will be authorized once you are discharged, as it is imperative that you return to your primary care physician (or establish a relationship with a primary care physician if you do not have one) for your aftercare needs so that they can reassess your need for medications and monitor your lab values. ° ° ° °

## 2015-06-18 LAB — BODY FLUID CULTURE
Culture: NO GROWTH
Special Requests: NORMAL

## 2015-06-19 ENCOUNTER — Telehealth: Payer: Self-pay | Admitting: Internal Medicine

## 2015-06-19 NOTE — Telephone Encounter (Signed)
Please see below and advise.

## 2015-06-19 NOTE — Telephone Encounter (Signed)
Patient has appointment on Friday in the am with Dr. Darrick Huntsmanullo.

## 2015-06-19 NOTE — Telephone Encounter (Signed)
Pt needs HFU, dx hernia, d/c 06/16/15. Pt in a lot of pain. Goes to see GI on 7/12. Please advise where to add to schedule. Pt only wants to see Dr. Darrick [email or web address]

## 2015-06-19 NOTE — Telephone Encounter (Signed)
Please schedule her for Friday morning.  My schedule is supposed to be opened for July 8 am only

## 2015-06-22 ENCOUNTER — Telehealth: Payer: Self-pay | Admitting: Internal Medicine

## 2015-06-22 ENCOUNTER — Telehealth: Payer: Self-pay

## 2015-06-22 ENCOUNTER — Encounter: Payer: Self-pay | Admitting: Emergency Medicine

## 2015-06-22 ENCOUNTER — Emergency Department: Payer: BC Managed Care – PPO

## 2015-06-22 ENCOUNTER — Inpatient Hospital Stay
Admission: EM | Admit: 2015-06-22 | Discharge: 2015-06-24 | DRG: 393 | Disposition: A | Discharge status: Home / Self Care | Payer: BC Managed Care – PPO | Attending: Internal Medicine | Admitting: Internal Medicine

## 2015-06-22 ENCOUNTER — Ambulatory Visit (INDEPENDENT_AMBULATORY_CARE_PROVIDER_SITE_OTHER): Payer: BC Managed Care – PPO | Admitting: Internal Medicine

## 2015-06-22 ENCOUNTER — Encounter: Payer: Self-pay | Admitting: Internal Medicine

## 2015-06-22 VITALS — BP 98/60 | HR 72 | Temp 97.5°F | Resp 12 | Ht 71.0 in | Wt 112.2 lb

## 2015-06-22 DIAGNOSIS — K760 Fatty (change of) liver, not elsewhere classified: Secondary | ICD-10-CM

## 2015-06-22 DIAGNOSIS — I10 Essential (primary) hypertension: Secondary | ICD-10-CM | POA: Diagnosis present

## 2015-06-22 DIAGNOSIS — K652 Spontaneous bacterial peritonitis: Secondary | ICD-10-CM | POA: Diagnosis present

## 2015-06-22 DIAGNOSIS — N189 Chronic kidney disease, unspecified: Secondary | ICD-10-CM | POA: Diagnosis not present

## 2015-06-22 DIAGNOSIS — K219 Gastro-esophageal reflux disease without esophagitis: Secondary | ICD-10-CM | POA: Diagnosis present

## 2015-06-22 DIAGNOSIS — N186 End stage renal disease: Secondary | ICD-10-CM | POA: Diagnosis present

## 2015-06-22 DIAGNOSIS — R112 Nausea with vomiting, unspecified: Secondary | ICD-10-CM | POA: Insufficient documentation

## 2015-06-22 DIAGNOSIS — I5032 Chronic diastolic (congestive) heart failure: Secondary | ICD-10-CM | POA: Diagnosis present

## 2015-06-22 DIAGNOSIS — N185 Chronic kidney disease, stage 5: Secondary | ICD-10-CM | POA: Diagnosis present

## 2015-06-22 DIAGNOSIS — Z992 Dependence on renal dialysis: Secondary | ICD-10-CM

## 2015-06-22 DIAGNOSIS — R197 Diarrhea, unspecified: Secondary | ICD-10-CM | POA: Diagnosis not present

## 2015-06-22 DIAGNOSIS — I480 Paroxysmal atrial fibrillation: Secondary | ICD-10-CM | POA: Diagnosis present

## 2015-06-22 DIAGNOSIS — I120 Hypertensive chronic kidney disease with stage 5 chronic kidney disease or end stage renal disease: Secondary | ICD-10-CM | POA: Diagnosis present

## 2015-06-22 DIAGNOSIS — K4090 Unilateral inguinal hernia, without obstruction or gangrene, not specified as recurrent: Principal | ICD-10-CM | POA: Diagnosis present

## 2015-06-22 DIAGNOSIS — J449 Chronic obstructive pulmonary disease, unspecified: Secondary | ICD-10-CM | POA: Diagnosis present

## 2015-06-22 DIAGNOSIS — K659 Peritonitis, unspecified: Secondary | ICD-10-CM | POA: Diagnosis not present

## 2015-06-22 DIAGNOSIS — E785 Hyperlipidemia, unspecified: Secondary | ICD-10-CM | POA: Diagnosis present

## 2015-06-22 DIAGNOSIS — D631 Anemia in chronic kidney disease: Secondary | ICD-10-CM

## 2015-06-22 DIAGNOSIS — F17210 Nicotine dependence, cigarettes, uncomplicated: Secondary | ICD-10-CM | POA: Diagnosis present

## 2015-06-22 DIAGNOSIS — K7460 Unspecified cirrhosis of liver: Secondary | ICD-10-CM | POA: Insufficient documentation

## 2015-06-22 LAB — BODY FLUID CELL COUNT WITH DIFFERENTIAL
Eos, Fluid: 2 %
Lymphs, Fluid: 40 %
Monocyte-Macrophage-Serous Fluid: 38 % — ABNORMAL LOW (ref 50–90)
Neutrophil Count, Fluid: 20 % (ref 0–25)
Other Cells, Fluid: 0 %
WBC FLUID: 434 uL (ref 0–1000)

## 2015-06-22 LAB — CBC WITH DIFFERENTIAL/PLATELET
BASOS PCT: 1 %
Basophils Absolute: 0.1 10*3/uL (ref 0–0.1)
EOS ABS: 0 10*3/uL (ref 0–0.7)
Eosinophils Relative: 0 %
HCT: 29.2 % — ABNORMAL LOW (ref 35.0–47.0)
Hemoglobin: 9.6 g/dL — ABNORMAL LOW (ref 12.0–16.0)
Lymphocytes Relative: 10 %
Lymphs Abs: 0.7 10*3/uL — ABNORMAL LOW (ref 1.0–3.6)
MCH: 28.3 pg (ref 26.0–34.0)
MCHC: 32.8 g/dL (ref 32.0–36.0)
MCV: 86.3 fL (ref 80.0–100.0)
MONOS PCT: 9 %
Monocytes Absolute: 0.7 10*3/uL (ref 0.2–0.9)
NEUTROS PCT: 80 %
Neutro Abs: 5.9 10*3/uL (ref 1.4–6.5)
PLATELETS: 148 10*3/uL — AB (ref 150–440)
RBC: 3.39 MIL/uL — ABNORMAL LOW (ref 3.80–5.20)
RDW: 19.6 % — ABNORMAL HIGH (ref 11.5–14.5)
WBC: 7.4 10*3/uL (ref 3.6–11.0)

## 2015-06-22 LAB — COMPREHENSIVE METABOLIC PANEL
ALT: 16 U/L (ref 14–54)
AST: 27 U/L (ref 15–41)
Albumin: 2.6 g/dL — ABNORMAL LOW (ref 3.5–5.0)
Alkaline Phosphatase: 135 U/L — ABNORMAL HIGH (ref 38–126)
Anion gap: 18 — ABNORMAL HIGH (ref 5–15)
BUN: 60 mg/dL — ABNORMAL HIGH (ref 6–20)
CO2: 22 mmol/L (ref 22–32)
Calcium: 6.6 mg/dL — ABNORMAL LOW (ref 8.9–10.3)
Chloride: 93 mmol/L — ABNORMAL LOW (ref 101–111)
Creatinine, Ser: 9.52 mg/dL — ABNORMAL HIGH (ref 0.44–1.00)
GFR calc Af Amer: 5 mL/min — ABNORMAL LOW (ref >60)
GFR, EST NON AFRICAN AMERICAN: 4 mL/min — AB (ref >60)
GLUCOSE: 101 mg/dL — AB (ref 65–99)
Potassium: 2.9 mmol/L — CL (ref 3.5–5.1)
SODIUM: 133 mmol/L — AB (ref 135–145)
TOTAL PROTEIN: 6 g/dL — AB (ref 6.5–8.1)
Total Bilirubin: 0.5 mg/dL (ref 0.3–1.2)

## 2015-06-22 MED ORDER — HYDROMORPHONE HCL 1 MG/ML IJ SOLN
INTRAMUSCULAR | Status: AC
  Administered 2015-06-22: 0.5 mg via INTRAVENOUS
  Filled 2015-06-22: qty 1

## 2015-06-22 MED ORDER — VANCOMYCIN HCL IN DEXTROSE 1-5 GM/200ML-% IV SOLN
1000.0000 mg | Freq: Once | INTRAVENOUS | Status: AC
  Administered 2015-06-22: 1000 mg via INTRAVENOUS

## 2015-06-22 MED ORDER — ONDANSETRON HCL 4 MG/2ML IJ SOLN
4.0000 mg | Freq: Once | INTRAMUSCULAR | Status: AC
  Administered 2015-06-22: 4 mg via INTRAVENOUS

## 2015-06-22 MED ORDER — HYDROMORPHONE HCL 1 MG/ML IJ SOLN
0.5000 mg | Freq: Once | INTRAMUSCULAR | Status: AC
  Administered 2015-06-22: 0.5 mg via INTRAVENOUS

## 2015-06-22 MED ORDER — ONDANSETRON HCL 4 MG/2ML IJ SOLN
INTRAMUSCULAR | Status: AC
  Administered 2015-06-22: 4 mg via INTRAVENOUS
  Filled 2015-06-22: qty 2

## 2015-06-22 MED ORDER — HYDROCODONE-ACETAMINOPHEN 5-325 MG PO TABS: 1.0000 | ORAL_TABLET | Freq: Four times a day (QID) | ORAL | Status: DC | PRN

## 2015-06-22 MED ORDER — VANCOMYCIN HCL IN DEXTROSE 1-5 GM/200ML-% IV SOLN
INTRAVENOUS | Status: AC
  Administered 2015-06-22: 1000 mg via INTRAVENOUS
  Filled 2015-06-22: qty 200

## 2015-06-22 MED ORDER — POTASSIUM CHLORIDE 20 MEQ PO PACK
PACK | ORAL | Status: AC
  Administered 2015-06-22: 40 meq via ORAL
  Filled 2015-06-22: qty 2

## 2015-06-22 MED ORDER — POTASSIUM CHLORIDE 20 MEQ PO PACK
40.0000 meq | PACK | Freq: Once | ORAL | Status: AC
  Administered 2015-06-22: 40 meq via ORAL

## 2015-06-22 NOTE — Assessment & Plan Note (Addendum)
Patient was not orthostatic by my evaluation , and has Zofran to use at home.  She has early satiety that is complicating her nausea.  Advised to drink Boost and increase her hydration ,  And will return ot ER if not able to maintain hydration.  I advised her that the symptoms could be due to c dificile since she is taking narcotics and has diarrhea. Testing is underway.

## 2015-06-22 NOTE — Assessment & Plan Note (Signed)
Chronic with drop to 8.8 noted after hydration [facility],  Continue  IV iron

## 2015-06-22 NOTE — Assessment & Plan Note (Signed)
Concern for c dif given recent empiric ure of ANTBIOTICS IN ED

## 2015-06-22 NOTE — Progress Notes (Signed)
Subjective:  Patient ID: Dorothy Long, female    DOB: [birth year]  Age: 54 y.o. MRN: [id number]  CC: The primary encounter diagnosis was Unilateral inguinal hernia without obstruction or gangrene, recurrence not specified. Diagnoses of Diarrhea, Unilateral inguinal hernia without obstruction, Fatty liver, Anemia in chronic kidney disease, and Non-intractable vomiting with nausea, vomiting of unspecified type were also pertinent to this visit.  HPI Dorothy Long presents for [place]  .  She was admitted June 30th with abd pain , nausea and vomiting ,  UA was normal. Scanned with abd Ct whch was negative.  Told she had an inguinal hernia and sent home,  Was told by Dr Kingsley Callander that she would be sent to Dr. Delana Meyer. States that she was still  vomiting on the day of discharge, and her RN was aware, but she was discharged anyway . Since she has been hhome she states that she  has not been able to eat without having vomiting and diarrhea.  Last meal was 1/4 egg sandwhich this morning 2 hours  prior to visit and  So far no vomiting,  But had a loose stool this am before she ate.  The loose stools started on Sunday .  Had a dose of abx on admission, none since.   Has been having loose stools twice daily , was not given probiotics during hospitalization, was not checked for c dificile colitis.  Has been back on  her probiotic since Saturday  But it has not helped.  She feels she was discharged too early.   BP has been low,  Having frequent chills,  Temp has been normal .  Urination has been reduced to once daily ,  Normals she makes urine  3-4 times daily despite having ESKD managed with peritoneal  dialysis   Anemia no improvement, receiving  iron infusions,  Last dose last Wednesday at dialysis center.    Left sided inguinal hernia has been constantly painful, no surgical consultation was done in house ;  follow up with Schnier was advised by [place]. But not scheduled   Outpatient  Prescriptions Prior to Visit  Medication Sig Dispense Refill  . acetaminophen (TYLENOL) 325 MG tablet Take 325 mg by mouth every 4 (four) hours as needed for mild pain.     Marland Kitchen albuterol (VENTOLIN HFA) 108 (90 BASE) MCG/ACT inhaler Inhale 1-2 puffs into the lungs every 6 (six) hours as needed for wheezing or shortness of breath.     . ALPRAZolam (XANAX) 0.25 MG tablet Take 1 tablet (0.25 mg total) by mouth 3 (three) times daily as needed for sleep. 30 tablet 0  . Calcium Acetate 667 MG TABS Take 3 capsules by mouth 3 (three) times daily.    . cinacalcet (SENSIPAR) 90 MG tablet Take 180 mg by mouth daily. With the largest meal of the day    . cyclobenzaprine (FLEXERIL) 10 MG tablet Take 1 tablet (10 mg total) by mouth 3 (three) times daily as needed for muscle spasms. 30 tablet 0  . diphenhydrAMINE (BENADRYL) 25 MG tablet Take 25 mg by mouth every other day. MON, WED, FRI BEFORE DIALYSIS    . HYDROcodone-acetaminophen (NORCO/VICODIN) 5-325 MG per tablet Take 1 tablet by mouth every 4 (four) hours as needed for moderate pain.     Marland Kitchen HYDROcodone-acetaminophen (NORCO/VICODIN) 5-325 MG per tablet Take 1 tablet by mouth every 6 (six) hours as needed for moderate pain. 30 tablet 0  . metoprolol succinate (TOPROL-XL) 25 MG 24  hr tablet TAKE 1 TABLET (25 MG TOTAL) BY MOUTH DAILY. 30 tablet 5  . ondansetron (ZOFRAN) 4 MG tablet Take 1 tablet (4 mg total) by mouth every 6 (six) hours as needed for nausea or vomiting. 20 tablet 0   No facility-administered medications prior to visit.    Review of Systems;  Patient denies headache, fevers, malaise, unintentional weight loss, skin rash, eye pain, sinus congestion and sinus pain, sore throat, dysphagia,  hemoptysis , cough, dyspnea, wheezing, chest pain, palpitations, orthopnea, edema, abdominal pain, nausea, melena, diarrhea, constipation, flank pain, dysuria, hematuria, urinary  Frequency, nocturia, numbness, tingling, seizures,  Focal weakness, Loss of  consciousness,  Tremor, insomnia, depression, anxiety, and suicidal ideation.      Objective:  BP 98/60 mmHg  Pulse 72  Temp(Src) 97.5 F (36.4 C) (Oral)  Resp 12  Ht $R'5\' 11"'SX$  (1.803 m)  Wt 112 lb 4 oz (50.916 kg)  BMI 15.66 kg/m2  SpO2 98%  BP Readings from Last 3 Encounters:  06/22/15 98/60  06/16/15 106/73  05/21/15 110/80    Wt Readings from Last 3 Encounters:  06/22/15 112 lb 4 oz (50.916 kg)  06/16/15 120 lb 1.6 oz (54.477 kg)  05/21/15 117 lb 12 oz (53.411 kg)    General appearance: alert, cooperative and appears stated age Ears: normal TM's and external ear canals both ears Throat: lips, mucosa, and tongue normal; teeth and gums normal Neck: no adenopathy, no carotid bruit, supple, symmetrical, trachea midline and thyroid not enlarged, symmetric, no tenderness/mass/nodules Back: symmetric, no curvature. ROM normal. No CVA tenderness. Lungs: clear to auscultation bilaterally Heart: regular rate and rhythm, S1, S2 normal, no murmur, click, rub or gallop Abdomen: soft, non-tender; bowel sounds normal; no masses,  no organomegaly Pulses: 2+ and symmetric Skin: Skin color, texture, turgor normal. No rashes or lesions Lymph nodes: Cervical, supraclavicular, and axillary nodes normal.  No results found for: HGBA1C  Lab Results  Component Value Date   CREATININE 9.15* 06/15/2015   CREATININE 9.41* 06/14/2015   CREATININE 8.87* 11/15/2014    Lab Results  Component Value Date   WBC 6.7 06/15/2015   HGB 8.8* 06/15/2015   HCT 27.2* 06/15/2015   PLT 161 06/15/2015   GLUCOSE 87 06/14/2015   LDLDIRECT 164.6 07/21/2012   ALT 13* 06/14/2015   AST 20 06/14/2015   NA 134* 06/14/2015   K 3.1* 06/14/2015   CL 92* 06/14/2015   CREATININE 9.15* 06/15/2015   BUN 69* 06/14/2015   CO2 22 06/14/2015   TSH 0.83 07/10/2013   INR 1.10 06/14/2015   MICROALBUR 63.1* 06/19/2014    Ct Abdomen Pelvis Wo Contrast  06/15/2015   CLINICAL DATA:  female with a known history of end  stage renal disease on peritoneal dialysis, COPD and non-O2 dependent presenting with chest pain/shortness of breath/abdominal pain.Patient complains of a myriad symptoms spanning the course of 2-3 weeks starting with abdominal pain, periumbilical, "pain" 6-7 out of 10 no worsening/relieving factors. Also complaining of shortness of breath 2-3 day total duration, nonproductive cough which is unchanged from baseline. Now also complaining of chest pain retrosternal location, nonradiating, "pain", no worsening/relieving factors  EXAM: CT ABDOMEN AND PELVIS WITHOUT CONTRAST  TECHNIQUE: Multidetector CT imaging of the abdomen and pelvis was performed following the standard protocol without IV contrast.  COMPARISON:  06/14/2015.  FINDINGS: Peritoneal cavity: Contrast extends throughout the peritoneal cavity. There is a small amount of contrast that lies along the medial margin of the left inguinal canal consistent with a small  direct inguinal hernia. This was seen is an area of irregular fluid attenuation on the previous day's CT. The presence of contrast within this confirms that it communicates with the peritoneal cavity. There is no other evidence of a hernia. Small amount of nondependent free intraperitoneal air is stable from the previous day's study.  Lung bases: Moderate cardiomegaly. Mild subsegmental lung base atelectasis. No pulmonary edema. Minimal right pleural effusion.  Liver: Morphologic changes consistent with cirrhosis. No mass or focal lesion.  Gallbladder surgically absent.  No bile duct dilation.  Spleen, pancreas, adrenal glands:  Unremarkable.  Kidneys, ureters, bladder: Marked renal atrophy. No renal mass or hydronephrosis. Ureters not visualized but are nondilated. Bladder is unremarkable.  Uterus and adnexa:  Uterus surgically absent.  No pelvic masses.  Lymph nodes:  No pathologically enlarged lymph nodes.  Gastrointestinal: Colon and small bowel are unremarkable. Normal appendix visualized.   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  IMPRESSION: 1. Small left inguinal hernia, which appears to be a direct hernia, communicating with peritoneal cavity. No other abdominal wall hernia. 2. No acute findings.   Electronically Signed   By: Lajean Manes M.D.   On: 06/15/2015 14:16   Dg Chest 2 View  06/14/2015   CLINICAL DATA:  Shortness of breath for 3 days  EXAM: CHEST - 2 VIEW  COMPARISON:  02/09/2015  FINDINGS: Cardiac shadow remains enlarged. The lungs are well aerated bilaterally. No focal infiltrate or sizable effusion is seen. Air is noted beneath the hemidiaphragms bilaterally suggestive of an intra-abdominal perforation. No bony abnormality is seen.  IMPRESSION: Findings of free air within the abdomen. CT of the abdomen and pelvis is recommended for further evaluation.  No acute abnormality is noted within the chest.  Critical Value/emergent results were called by telephone at the time of interpretation on 06/14/2015 at 11:02 pm to Dr. Lenise Arena , who verbally acknowledged these results.   Electronically Signed   By: Inez Catalina M.D.   On: 06/14/2015 23:02   Ct Abdomen Pelvis W Contrast  06/14/2015   CLINICAL DATA:  Free air on recent chest x-ray  EXAM: CT ABDOMEN AND PELVIS WITH CONTRAST  TECHNIQUE: Multidetector CT imaging of the abdomen and pelvis was performed using the standard protocol following bolus administration of intravenous contrast.  CONTRAST:  69mL OMNIPAQUE IOHEXOL 300 MG/ML  SOLN  COMPARISON:  11/24/2014, 07/11/2014 .  FINDINGS: Lung bases demonstrate no focal infiltrate or sizable effusion. The cardiac shadow remains enlarged.  The left lobe of the liver is somewhat hypertrophied related to some underlying cirrhotic change. Mottled appearance of the hepatic parenchyma is noted likely related to elevated central venous pressures and right heart failure. The inferior vena cava is prominent.  The spleen, adrenal glands and pancreas are  within normal limits. The gallbladder has been surgically removed.  Diffuse aortoiliac calcifications are noted without aneurysmal dilatation. The native kidneys are small shrunken consistent with the given clinical history of end-stage renal disease. A peritoneal dialysis catheter is in place. Free fluid in the pelvis and possibly the free air noted on recent chest x-ray are related to the peritoneal dialysis. There is free air identified on this exam although no definitive changes to suggest perforation are noted. No diverticulitis is seen.  The osseous structures show no acute abnormality. The bladder is partially distended.  IMPRESSION: Free fluid and free air within the abdomen which given the patient's peritoneal dialysis catheter may simply be related to peritoneal dialysis therapy. No definitive findings  to suggest bowel perforation are seen.  Mottled appearance to the liver which is stable over multiple previous exams consistent with elevated central venous pressures  Shrunken native kidneys consistent with the given clinical history of end-stage renal disease.   Electronically Signed   By: Inez Catalina M.D.   On: 06/14/2015 23:57    Assessment & Plan:   Problem List Items Addressed This Visit      Unprioritized   Anemia in chronic kidney disease    Chronic with drop to 8.8 noted after hydration during [facility] hospitalization July 2016,  Continue  IV iron       Unilateral inguinal hernia without obstruction - Primary    Patient reports that Dr Kingsley Callander wanted her to see Dr Delana Meyer for follow up on her hernia .  She was worked in  Today at [employer], only to find out from [place] the he does not think the hernia is related to her peritoneal catheter , and they do not hanel inguinal hernia repairs,  Referral to General Surgery in process to [employer] and [employer].       Diarrhea    Concern for c dif given recent empiric ure of ANTBIOTICS IN ED      Relevant Orders   Clostridium difficile EIA     Fatty liver    Presumed by Ct scan done during recent adisison for abdominal pain.  Patient has been referred to [facility]       Nausea & vomiting    Patient was not orthostatic by my evaluation , and has Zofran to use at home.  She has early satiety that is complicating her nausea.  Advised to drink Boost and increase her hydration ,  And will return ot ER if not able to maintain hydration.  I advised her that the symptoms could be due to c dificile since she is taking narcotics and has diarrhea. Testing is underway.         I am having Ms. Freshour start on HYDROcodone-acetaminophen. I am also having her maintain her acetaminophen, diphenhydrAMINE, Calcium Acetate, HYDROcodone-acetaminophen, albuterol, metoprolol succinate, cyclobenzaprine, cinacalcet, HYDROcodone-acetaminophen, ALPRAZolam, and ondansetron.  Meds ordered this encounter  Medications  . HYDROcodone-acetaminophen (NORCO/VICODIN) 5-325 MG per tablet    Sig: Take 1 tablet by mouth every 6 (six) hours as needed for moderate pain.    Dispense:  30 tablet    Refill:  0    There are no discontinued medications.  Follow-up: No Follow-up on file.   [name], MD

## 2015-06-22 NOTE — Assessment & Plan Note (Signed)
Patient reports that Dr Jeanella CaraLatiffe wanted her to see Dr Gilda CreaseSchnier for follow up on her hernia .  She was worked in  Today at [employer], only to find out from [facility] the he does not think the hernia is related to her peritoneal catheter , and they do not hanel inguinal hernia repairs,  Referral to General Surgery in process to [employer] [facility] [employer].

## 2015-06-22 NOTE — Telephone Encounter (Signed)
EMS called

## 2015-06-22 NOTE — H&P (Addendum)
[facility] - [place] at [facility] : [facility]   PATIENT NAME: Dorothy Long    MR#:  [id number]  DATE OF BIRTH:  [birth year]  DATE OF ADMISSION:  06/22/2015  PRIMARY CARE PHYSICIAN: Sherlene Shams, MD   REQUESTING/REFERRING PHYSICIAN: Malinda  CHIEF COMPLAINT:   Chief Complaint  Patient presents with  . Hernia    HISTORY OF PRESENT ILLNESS:  Dorothy Long  is a 54 y.o. female who presents with nausea vomiting and abdominal pain. Patient states that about 3-4 days ago she began having increased abdominal pain followed by nausea and vomiting. She came to the ED today because her symptoms had progressed significantly. She is also noticed recently that she has a left inguinal hernia, which she was being scheduled to have repaired. She states that she was supposed to go see the vascular surgeon, but that this referral was made by mistake, as she was supposed to see someone who would repair her inguinal hernia. However, on evaluation in the ED she was noted to have high white blood cell count and her peritoneal fluid, raising concern for SBP. Dr. Cherylann Ratel from nephrology was contacted and recommended starting antibiotics and bringing her into the hospital for treatment. Of note, she is an end-stage renal disease on peritoneal dialysis. Hospitalists were called for admission for SBP, though she will need to have this question of her inguinal hernia repair addressed as well.  PAST MEDICAL HISTORY:   Past Medical History  Diagnosis Date  . Hypertension   . Hyperlipidemia   . COPD (chronic obstructive pulmonary disease)   . Anemia   . Mitral valve stenosis, severe   . CHF (congestive heart failure)   . Chronic kidney disease   . Acute on chronic renal failure   . Diastolic congestive heart failure   . A-fib   . Dialysis patient   . Diabetes mellitus without complication     PAST SURGICAL HISTORY:   Past Surgical History  Procedure Laterality Date  . Abdominal  hysterectomy    . Cholecystectomy    . Knee surgery Right   . Foot surgery Bilateral   . Av fistula placement      SOCIAL HISTORY:   History  Substance Use Topics  . Smoking status: Current Some Day Smoker -- 0.25 packs/day for 20 years    Types: Cigarettes  . Smokeless tobacco: Never Used  . Alcohol Use: No     Comment: occasional    FAMILY HISTORY:   Family History  Problem Relation Age of Onset  . Cancer Mother     colon  . Heart disease Father   . Hypertension Father     DRUG ALLERGIES:   Allergies  Allergen Reactions  . Ciprocinonide [Fluocinolone]   . Demerol [Meperidine]   . Diflucan [Fluconazole]   . Flagyl [Metronidazole]   . Levaquin [Levofloxacin In D5w] Other (See Comments)    Numbness in legs  . Morphine And Related Nausea And Vomiting  . Tetracyclines & Related   . Valium [Diazepam]     MEDICATIONS AT HOME:   Prior to Admission medications   Medication Sig Start Date End Date Taking? Authorizing Provider  acetaminophen (TYLENOL) 325 MG tablet Take 325 mg by mouth every 4 (four) hours as needed for mild pain.    Yes Historical Provider, MD  albuterol (VENTOLIN HFA) 108 (90 BASE) MCG/ACT inhaler Inhale 1-2 puffs into the lungs every 6 (six) hours as needed for wheezing or shortness of breath.  Yes Historical Provider, MD  ALPRAZolam (XANAX) 0.25 MG tablet Take 1 tablet (0.25 mg total) by mouth 3 (three) times daily as needed for sleep. 06/16/15  Yes Houston Siren, MD  Calcium Acetate 667 MG TABS Take 3 capsules by mouth 3 (three) times daily.   Yes Historical Provider, MD  cinacalcet (SENSIPAR) 90 MG tablet Take 180 mg by mouth daily. With the largest meal of the day   Yes Historical Provider, MD  diphenhydrAMINE (BENADRYL) 25 MG tablet Take 25 mg by mouth every other day. MON, WED, FRI BEFORE DIALYSIS   Yes Historical Provider, MD  furosemide (LASIX) 80 MG tablet Take 80 mg by mouth daily.   Yes Historical Provider, MD  HYDROcodone-acetaminophen  (NORCO/VICODIN) 5-325 MG per tablet Take 1 tablet by mouth every 4 (four) hours as needed for moderate pain.    Yes Historical Provider, MD  HYDROcodone-acetaminophen (NORCO/VICODIN) 5-325 MG per tablet Take 1 tablet by mouth every 6 (six) hours as needed for moderate pain. 06/16/15  Yes Houston Siren, MD  HYDROcodone-acetaminophen (NORCO/VICODIN) 5-325 MG per tablet Take 1 tablet by mouth every 6 (six) hours as needed for moderate pain. 06/22/15  Yes Sherlene Shams, MD  metoprolol succinate (TOPROL-XL) 25 MG 24 hr tablet TAKE 1 TABLET (25 MG TOTAL) BY MOUTH DAILY. 03/05/15  Yes Sherlene Shams, MD  ondansetron (ZOFRAN) 4 MG tablet Take 1 tablet (4 mg total) by mouth every 6 (six) hours as needed for nausea or vomiting. 06/16/15  Yes Houston Siren, MD  RENVELA 800 MG tablet Take 1 tablet by mouth 3 (three) times daily with meals. 06/17/15  Yes Historical Provider, MD  cyclobenzaprine (FLEXERIL) 10 MG tablet Take 1 tablet (10 mg total) by mouth 3 (three) times daily as needed for muscle spasms. 05/21/15   Sherlene Shams, MD    REVIEW OF SYSTEMS:  Review of Systems  Constitutional: Positive for chills. Negative for fever, weight loss and malaise/fatigue.  HENT: Negative for ear pain, hearing loss and tinnitus.   Eyes: Negative for blurred vision, double vision, pain and redness.  Respiratory: Negative for cough, hemoptysis and shortness of breath.   Cardiovascular: Negative for chest pain, palpitations, orthopnea and leg swelling.  Gastrointestinal: Positive for nausea, vomiting and abdominal pain. Negative for diarrhea and constipation.  Genitourinary: Negative for dysuria, frequency and hematuria.  Musculoskeletal: Negative for back pain, joint pain and neck pain.  Skin:       No acne, rash, or lesions  Neurological: Negative for dizziness, tremors, focal weakness and weakness.  Endo/Heme/Allergies: Negative for polydipsia. Does not bruise/bleed easily.  Psychiatric/Behavioral: Negative for  depression. The patient is not nervous/anxious and does not have insomnia.      VITAL SIGNS:   Filed Vitals:   06/22/15 1900 06/22/15 1930 06/22/15 2000 06/22/15 2030  BP: 95/70 86/73 103/82 94/75  Pulse:      Temp:      TempSrc:      Resp: 20 21 18 20   Height:      Weight:      SpO2:       Wt Readings from Last 3 Encounters:  06/22/15 52.164 kg (115 lb)  06/22/15 50.916 kg (112 lb 4 oz)  06/16/15 54.477 kg (120 lb 1.6 oz)    PHYSICAL EXAMINATION:  Physical Exam  Constitutional: She is oriented to person, place, and time. She appears well-developed and well-nourished. No distress.  HENT:  Head: Normocephalic and atraumatic.  Mouth/Throat: Oropharynx is clear and moist.  Eyes: Conjunctivae and EOM are normal. Pupils are equal, round, and reactive to light. No scleral icterus.  Neck: Normal range of motion. Neck supple. No JVD present. No thyromegaly present.  Cardiovascular: Normal rate, regular rhythm and intact distal pulses.  Exam reveals no gallop and no friction rub.   No murmur heard. Respiratory: Effort normal and breath sounds normal. No respiratory distress. She has no wheezes. She has no rales.  GI: Soft. Bowel sounds are normal. She exhibits no distension. There is tenderness (diffuse, but greatest in her lower abdomen.).  Musculoskeletal: Normal range of motion. She exhibits no edema.  Left inguinal hernia, No arthritis, no gout  Lymphadenopathy:    She has no cervical adenopathy.  Neurological: She is alert and oriented to person, place, and time. No cranial nerve deficit.  No dysarthria, no aphasia  Skin: Skin is warm and dry. No rash noted. No erythema.  Psychiatric: She has a normal mood and affect. Her behavior is normal. Judgment and thought content normal.    LABORATORY PANEL:   CBC  Recent Labs Lab 06/22/15 1515  WBC 7.4  HGB 9.6*  HCT 29.2*  PLT 148*    ------------------------------------------------------------------------------------------------------------------  Chemistries   Recent Labs Lab 06/22/15 1515  NA 133*  K 2.9*  CL 93*  CO2 22  GLUCOSE 101*  BUN 60*  CREATININE 9.52*  CALCIUM 6.6*  AST 27  ALT 16  ALKPHOS 135*  BILITOT 0.5   ------------------------------------------------------------------------------------------------------------------  Cardiac Enzymes No results for input(s): TROPONINI in the last 168 hours. ------------------------------------------------------------------------------------------------------------------  RADIOLOGY:  Ct Abdomen Pelvis Wo Contrast  06/22/2015   CLINICAL DATA:  Bold abdominal pain, nausea and vomiting, recently discharge from hospital. Diarrhea. History of end-stage renal disease on peritoneal dialysis, COPD.  EXAM: CT ABDOMEN AND PELVIS WITHOUT CONTRAST  TECHNIQUE: Multidetector CT imaging of the abdomen and pelvis was performed following the standard protocol without IV contrast.  COMPARISON:  CT abdomen and pelvis June 15, 2015 and June 14, 2015.  FINDINGS: LUNG BASES: Included view of the chest demonstrates moderate cardiomegaly. Small pericardial effusion was present previously.  KIDNEYS/BLADDER: Kidneys are orthotopic, symmetrically atrophic. No nephrolithiasis, hydronephrosis; limited assessment for renal masses on this nonenhanced examination. The unopacified ureters are normal in course and caliber. Urinary bladder is decompressed and unremarkable.  SOLID ORGANS: The liver is mildly nodular, with enlarged LEFT lobe. Spleen, pancreas and adrenal glands are unremarkable for this non-contrast examination. Status post cholecystectomy.  GASTROINTESTINAL TRACT: The stomach, small and large bowel are normal in course and caliber without inflammatory changes, the sensitivity may be decreased by lack of enteric contrast. Fluid within the small and large bowel. Normal appendix  containing contrast in a air.  PERITONEUM/RETROPERITONEUM: Aortoiliac vessels are normal in course and caliber, moderate calcific atherosclerosis. No lymphadenopathy by CT size criteria. Status post hysterectomy. No intraperitoneal free fluid nor free air. No residual intraperitoneal contrast.  SOFT TISSUES/ OSSEOUS STRUCTURES: Nonsuspicious. Peritoneal dialysis catheter. 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 Small fat containing umbilical hernia. Small LEFT femoral hernia without bowel.  IMPRESSION: Fluid filled nondistended small and large bowel may represent enteritis without bowel obstruction.  No residual peritoneal contrast, peritoneal dialysis catheter in place with atrophic native kidneys.  Cirrhosis.  Cardiomegaly with small, stable pericardial effusion.   Electronically Signed   By: Awilda Metroourtnay  Bloomer M.D.   On: 06/22/2015 22:56    EKG:   Orders placed or performed during the hospital encounter of 06/14/15  . EKG 12-Lead  .  EKG 12-Lead  . EKG    IMPRESSION AND PLAN:  Principal Problem:   SBP (spontaneous bacterial peritonitis) - elevated white blood cell count and her peritoneal fluid, in conjunction with her chills, and abdominal pain with nausea vomiting. Per nephrology's recommendations we have started antibiotics, and consulted them. Peritoneal fluid culture sent as well. Active Problems:   End stage kidney disease - on peritoneal dialysis, consult nephrology for dialysis support.   Nausea & vomiting - patient is responded well to Zofran. We will continue antiemetics on board, and treat the underlying cause as above.   Hypertension - hold antihypertensives, except for metoprolol which is a rate controlling medicine for paroxysmal A. fib, see below. Were holding her blood pressure medicine as she is actually borderline hypotensive, likely due to her nausea and vomiting. We will give her back very gentle fluids tonight.   Left inguinal  hernia - patient will need this addressed, need to determine who will be doing this repair. Likely not to be done until the question of her SBP is sorted out and/or treated.   Hyperlipidemia - continue home meds   GERD (gastroesophageal reflux disease) - PPI while here   Paroxysmal atrial fibrillation - continue home dose of Toprol for rate control.  All the records are reviewed and case discussed with ED provider. Management plans discussed with the patient and/or family.  DVT PROPHYLAXIS: SubQ heparin  ADMISSION STATUS: Inpatient  CODE STATUS: Full  TOTAL TIME TAKING CARE OF THIS PATIENT: 45 minutes.    Kaydence Baba FIELDING 06/22/2015, 11:03 PM  [facility]  Office  [phone]  CC: Primary care physician; Sherlene Shams, MD

## 2015-06-22 NOTE — ED Notes (Signed)
Into room to reinforce to patient and family reasons for delays.  Explained that there were several Emergent patients and apologized for the delay.  Assured that Dr. Darnelle CatalanMalinda was working hard to see everyone who had to wait due to Emergencies.  Patient's spouse stated that he saw the Emergency but that it was "long gone now". Reinforced that we were working and apologized for their delay.  Explain ation only moderately effective.  Continue to monitor.

## 2015-06-22 NOTE — ED Notes (Signed)
Pt states she has an inguinal hernia, was just released from the hospital last Sat for the same. C/o left groin pain.

## 2015-06-22 NOTE — Telephone Encounter (Signed)
Patient was seen this morning for a TCM  For a hernia.  Patient left office and went to referral per orders and they are unable to assist the patient.  Patient was not feeling well so her son brought her back here to the office.  Patient complained of increase hernial pain.  Patient was taken to a room, experienced large amounts of vomiting.  Patient stated that she was unable to get herself to the ER and requested we call 911 for transport to [facility] ER.  EMS called, arrived and patient taken via stretcher to ED.  Please follow.

## 2015-06-22 NOTE — ED Provider Notes (Signed)
[facility] Emergency Department Provider Note  ____________________________________________  Time seen: Approximately 9:23 PM  I have reviewed the triage vital signs and the nursing notes.   HISTORY  Chief Complaint Hernia   HPI Dorothy Long is a 54 y.o. female  who reports she's been having nausea vomiting and diarrhea for the last few days. She went to see Dr. to low today. Dr. to [facility] room. Other investigation it turns out that the patient is post to have her inguinal hernia repaired today. She was supposed to have gone to see vascular surgery but ended up here instead. Patient complains of abdominal pain. Patient also complains of pain in the hernia area.   Past Medical History  Diagnosis Date  . Hypertension   . Hyperlipidemia   . COPD (chronic obstructive pulmonary disease)   . Anemia   . Mitral valve stenosis, severe   . CHF (congestive heart failure)   . Chronic kidney disease   . Acute on chronic renal failure   . Diastolic congestive heart failure   . A-fib   . Dialysis patient   . Diabetes mellitus without complication     Patient Active Problem List   Diagnosis Date Noted  . Unilateral inguinal hernia without obstruction 06/22/2015  . Diarrhea 06/22/2015  . Fatty liver 06/22/2015  . Nausea & vomiting 06/22/2015  . Chest pain 06/15/2015  . TMJ (sprain of temporomandibular joint) 05/21/2015  . Postmenopausal atrophic vaginitis 06/19/2014  . Allergic rhinitis 05/29/2014  . Bilateral leg edema 05/15/2014  . Awaiting organ transplant 12/23/2013  . Hyperparathyroidism due to renal insufficiency 12/23/2013  . Excess fluid volume 09/27/2013  . Paroxysmal atrial fibrillation 09/25/2013  . GERD (gastroesophageal reflux disease) 08/12/2013  . Focal and segmental hyalinosis 08/08/2013  . Mitral stenosis with incompetence or regurgitation 07/19/2013  . Anemia in chronic kidney disease 07/19/2013  . End stage kidney  disease 07/14/2013  . Tobacco abuse counseling 12/20/2012  . Vitamin D deficiency 07/22/2012  . Hyperlipidemia   . Hypertension 07/21/2012  . Family history of colon cancer 07/21/2012  . History of tobacco abuse 07/21/2012    Past Surgical History  Procedure Laterality Date  . Abdominal hysterectomy    . Cholecystectomy    . Knee surgery Right   . Foot surgery Bilateral   . Av fistula placement      Current Outpatient Rx  Name  Route  Sig  Dispense  Refill  . acetaminophen (TYLENOL) 325 MG tablet   Oral   Take 325 mg by mouth every 4 (four) hours as needed for mild pain.          Marland Kitchen albuterol (VENTOLIN HFA) 108 (90 BASE) MCG/ACT inhaler   Inhalation   Inhale 1-2 puffs into the lungs every 6 (six) hours as needed for wheezing or shortness of breath.          . ALPRAZolam (XANAX) 0.25 MG tablet   Oral   Take 1 tablet (0.25 mg total) by mouth 3 (three) times daily as needed for sleep.   30 tablet   0   . Calcium Acetate 667 MG TABS   Oral   Take 3 capsules by mouth 3 (three) times daily.         . cinacalcet (SENSIPAR) 90 MG tablet   Oral   Take 180 mg by mouth daily. With the largest meal of the day         . diphenhydrAMINE (BENADRYL) 25 MG tablet  Oral   Take 25 mg by mouth every other day. MON, WED, FRI BEFORE DIALYSIS         . furosemide (LASIX) 80 MG tablet   Oral   Take 80 mg by mouth daily.         Marland Kitchen. HYDROcodone-acetaminophen (NORCO/VICODIN) 5-325 MG per tablet   Oral   Take 1 tablet by mouth every 4 (four) hours as needed for moderate pain.          Marland Kitchen. HYDROcodone-acetaminophen (NORCO/VICODIN) 5-325 MG per tablet   Oral   Take 1 tablet by mouth every 6 (six) hours as needed for moderate pain.   30 tablet   0   . HYDROcodone-acetaminophen (NORCO/VICODIN) 5-325 MG per tablet   Oral   Take 1 tablet by mouth every 6 (six) hours as needed for moderate pain.   30 tablet   0   . metoprolol succinate (TOPROL-XL) 25 MG 24 hr tablet       TAKE 1 TABLET (25 MG TOTAL) BY MOUTH DAILY.   30 tablet   5   . ondansetron (ZOFRAN) 4 MG tablet   Oral   Take 1 tablet (4 mg total) by mouth every 6 (six) hours as needed for nausea or vomiting.   20 tablet   0   . RENVELA 800 MG tablet   Oral   Take 1 tablet by mouth 3 (three) times daily with meals.           Dispense as written.   . cyclobenzaprine (FLEXERIL) 10 MG tablet   Oral   Take 1 tablet (10 mg total) by mouth 3 (three) times daily as needed for muscle spasms.   30 tablet   0     Allergies Ciprocinonide; Demerol; Diflucan; Flagyl; Levaquin; Morphine and related; Tetracyclines & related; and Valium  Family History  Problem Relation Age of Onset  . Cancer Mother     colon  . Heart disease Father   . Hypertension Father     Social History History  Substance Use Topics  . Smoking status: Current Some Day Smoker -- 0.25 packs/day for 20 years    Types: Cigarettes  . Smokeless tobacco: Never Used  . Alcohol Use: No     Comment: occasional    Review of Systems Constitutional: No fever/chills Eyes: No visual changes. ENT: No sore throat. Cardiovascular: Denies chest pain. Respiratory: Denies shortness of breath. Gastrointestinal:No constipation. Genitourinary: Negative for dysuria. Musculoskeletal: Negative for back pain. Skin: Negative for rash. Neurological: Negative for headaches, focal weakness or numbness.  10-point ROS otherwise negative.  ____________________________________________   PHYSICAL EXAM:  VITAL SIGNS: ED Triage Vitals  Enc Vitals Group     BP 06/22/15 1511 92/66 mmHg     Pulse Rate 06/22/15 1511 88     Resp 06/22/15 1511 18     Temp 06/22/15 1511 98.4 F (36.9 C)     Temp Source 06/22/15 1511 Oral     SpO2 06/22/15 1511 99 %     Weight 06/22/15 1511 115 lb (52.164 kg)     Height 06/22/15 1511 5\' 10"  (1.778 m)     Head Cir --      Peak Flow --      Pain Score 06/22/15 1512 10     Pain Loc --      Pain Edu? --       Excl. in GC? --     Constitutional: Alert and oriented.  Eyes: Conjunctivae are normal. PERRL.  EOMI. Head: Atraumatic. Nose: No congestion/rhinnorhea. Mouth/Throat: Mucous membranes are moist.  Oropharynx non-erythematous. Neck: No stridor.Cardiovascular: Normal rate, regular rhythm. Grossly normal heart sounds.  Good peripheral circulation. Respiratory: Normal respiratory effort.  No retractions. Lungs CTAB. Gastrointestinal: Soft but diffusely tender No distention. No abdominal bruits. No CVA tenderness. Musculoskeletal: No lower extremity tenderness nor edema.  No joint effusions. Neurologic:  Normal speech and language. No gross focal neurologic deficits are appreciated. Speech is normal. No gait instability. Skin:  Skin is warm, dry and intact. No rash noted. Psychiatric: Mood and affect are normal. Speech and behavior are normal.   ____________________________________________   LABS (all labs ordered are listed, but only abnormal results are displayed)  Labs Reviewed  COMPREHENSIVE METABOLIC PANEL - Abnormal; Notable for the following:    Sodium 133 (*)    Potassium 2.9 (*)    Chloride 93 (*)    Glucose, Bld 101 (*)    BUN 60 (*)    Creatinine, Ser 9.52 (*)    Calcium 6.6 (*)    Total Protein 6.0 (*)    Albumin 2.6 (*)    Alkaline Phosphatase 135 (*)    GFR calc non Af Amer 4 (*)    GFR calc Af Amer 5 (*)    Anion gap 18 (*)    All other components within normal limits  CBC WITH DIFFERENTIAL/PLATELET - Abnormal; Notable for the following:    RBC 3.39 (*)    Hemoglobin 9.6 (*)    HCT 29.2 (*)    RDW 19.6 (*)    Platelets 148 (*)    Lymphs Abs 0.7 (*)    All other components within normal limits  BODY FLUID CELL COUNT WITH DIFFERENTIAL - Abnormal; Notable for the following:    Appearance, Fluid HAZY (*)    Monocyte-Macrophage-Serous Fluid 38 (*)    All other components within normal limits  BODY FLUID CULTURE  URINALYSIS COMPLETEWITH MICROSCOPIC ([facility] ONLY)   PATHOLOGIST SMEAR REVIEW   ____________________________________________  EKG   ____________________________________________  RADIOLOGY   ____________________________________________   PROCEDURES   ____________________________________________   INITIAL IMPRESSION / ASSESSMENT AND PLAN / ED COURSE  Pertinent labs & imaging results that were available during my care of the patient were reviewed by me and considered in my medical decision making (see chart for details).   ____________________________________________   FINAL CLINICAL IMPRESSION(S) / ED DIAGNOSES  Final diagnoses:  Peritonitis      Arnaldo Natal, MD 06/22/15 2344

## 2015-06-22 NOTE — Progress Notes (Signed)
Pre-visit discussion using our clinic review tool. No additional management support is needed unless otherwise documented below in the visit note.  

## 2015-06-22 NOTE — Patient Instructions (Addendum)
Your diarrhea may be due to an infection called C dificile,  Please return a stool sample to us ASAP  Urgent referral to Dr Gilda CreaseSchnier to repair your inguinal hernia  If your vomiting becomes frequent, or you feel faint when you stand up,  Go to ER  Inguinal Hernia, Adult Muscles help keep everything in the body in its proper place. But if a weak spot in the muscles develops, something can poke through. That is called a hernia. When this happens in the lower part of the belly (abdomen), it is called an inguinal hernia. (It takes its name from a part of the body in this region called the inguinal canal.) A weak spot in the wall of muscles lets some fat or part of the small intestine bulge through. An inguinal hernia can develop at any age. Men get them more often than women. CAUSES  In adults, an inguinal hernia develops over time.  It can be triggered by:  Suddenly straining the muscles of the lower abdomen.  Lifting heavy objects.  Straining to have a bowel movement. Difficult bowel movements (constipation) can lead to this.  Constant coughing. This may be caused by smoking or lung disease.  Being overweight.  Being pregnant.  Working at a job that requires long periods of standing or heavy lifting.  Having had an inguinal hernia before. One type can be an emergency situation. It is called a strangulated inguinal hernia. It develops if part of the small intestine slips through the weak spot and cannot get back into the abdomen. The blood supply can be cut off. If that happens, part of the intestine may die. This situation requires emergency surgery. SYMPTOMS  Often, a small inguinal hernia has no symptoms. It is found when a healthcare provider does a physical exam. Larger hernias usually have symptoms.   In adults, symptoms may include:  A lump in the groin. This is easier to see when the person is standing. It might disappear when lying down.  In men, a lump in the  scrotum.  Pain or burning in the groin. This occurs especially when lifting, straining or coughing.  A dull ache or feeling of pressure in the groin.  Signs of a strangulated hernia can include:  A bulge in the groin that becomes very painful and tender to the touch.  A bulge that turns red or purple.  Fever, nausea and vomiting.  Inability to have a bowel movement or to pass gas. DIAGNOSIS  To decide if you have an inguinal hernia, a healthcare provider will probably do a physical examination.  This will include asking questions about any symptoms you have noticed.  The healthcare provider might feel the groin area and ask you to cough. If an inguinal hernia is felt, the healthcare provider may try to slide it back into the abdomen.  Usually no other tests are needed. TREATMENT  Treatments can vary. The size of the hernia makes a difference. Options include:  Watchful waiting. This is often suggested if the hernia is small and you have had no symptoms.  No medical procedure will be done unless symptoms develop.  You will need to watch closely for symptoms. If any occur, contact your healthcare provider right away.  Surgery. This is used if the hernia is larger or you have symptoms.  Open surgery. This is usually an outpatient procedure (you will not stay overnight in a hospital). An cut (incision) is made through the skin in the groin. The hernia  is put back inside the abdomen. The weak area in the muscles is then repaired by herniorrhaphy or hernioplasty. Herniorrhaphy: in this type of surgery, the weak muscles are sewn back together. Hernioplasty: a patch or mesh is used to close the weak area in the abdominal wall.  Laparoscopy. In this procedure, a surgeon makes small incisions. A thin tube with a tiny video camera (called a laparoscope) is put into the abdomen. The surgeon repairs the hernia with mesh by looking with the video camera and using two long instruments. HOME  CARE INSTRUCTIONS   After surgery to repair an inguinal hernia:  You will need to take pain medicine prescribed by your healthcare provider. Follow all directions carefully.  You will need to take care of the wound from the incision.  Your activity will be restricted for awhile. This will probably include no heavy lifting for several weeks. You also should not do anything too active for a few weeks. When you can return to work will depend on the type of job that you have.  During "watchful waiting" periods, you should:  Maintain a healthy weight.  Eat a diet high in fiber (fruits, vegetables and whole grains).  Drink plenty of fluids to avoid constipation. This means drinking enough water and other liquids to keep your urine clear or pale yellow.  Do not lift heavy objects.  Do not stand for long periods of time.  Quit smoking. This should keep you from developing a frequent cough. SEEK MEDICAL CARE IF:   A bulge develops in your groin area.  You feel pain, a burning sensation or pressure in the groin. This might be worse if you are lifting or straining.  You develop a fever of more than 100.5 F (38.1 C). SEEK IMMEDIATE MEDICAL CARE IF:   Pain in the groin increases suddenly.  A bulge in the groin gets bigger suddenly and does not go down.  For men, there is sudden pain in the scrotum. Or, the size of the scrotum increases.  A bulge in the groin area becomes red or purple and is painful to touch.  You have nausea or vomiting that does not go away.  You feel your heart beating much faster than normal.  You cannot have a bowel movement or pass gas.  You develop a fever of more than 102.0 F (38.9 C). Document Released: 04/19/2009 Document Revised: 02/23/2012 Document Reviewed: 04/19/2009 [facility] Patient Information 2015 [place], [place]. This information is not intended to replace advice given to you by your health care provider. Make sure you discuss any questions  you have with your health care provider.

## 2015-06-22 NOTE — Assessment & Plan Note (Addendum)
Presumed by Ct scan done during recent adisison for abdominal pain.  [facility]

## 2015-06-23 LAB — BODY FLUID CELL COUNT WITH DIFFERENTIAL
EOS FL: 8 %
LYMPHS FL: 18 %
Monocyte-Macrophage-Serous Fluid: 54 % (ref 50–90)
Neutrophil Count, Fluid: 20 % (ref 0–25)
Other Cells, Fluid: 0 %
Total Nucleated Cell Count, Fluid: 52 cu mm (ref 0–1000)

## 2015-06-23 LAB — CBC
HCT: 26.2 % — ABNORMAL LOW (ref 35.0–47.0)
Hemoglobin: 8.5 g/dL — ABNORMAL LOW (ref 12.0–16.0)
MCH: 28 pg (ref 26.0–34.0)
MCHC: 32.3 g/dL (ref 32.0–36.0)
MCV: 86.7 fL (ref 80.0–100.0)
PLATELETS: 111 10*3/uL — AB (ref 150–440)
RBC: 3.02 MIL/uL — AB (ref 3.80–5.20)
RDW: 19.7 % — ABNORMAL HIGH (ref 11.5–14.5)
WBC: 6.3 10*3/uL (ref 3.6–11.0)

## 2015-06-23 LAB — BASIC METABOLIC PANEL
Anion gap: 17 — ABNORMAL HIGH (ref 5–15)
BUN: 67 mg/dL — AB (ref 6–20)
CO2: 20 mmol/L — ABNORMAL LOW (ref 22–32)
Calcium: 7 mg/dL — ABNORMAL LOW (ref 8.9–10.3)
Chloride: 97 mmol/L — ABNORMAL LOW (ref 101–111)
Creatinine, Ser: 9.98 mg/dL — ABNORMAL HIGH (ref 0.44–1.00)
GFR calc Af Amer: 5 mL/min — ABNORMAL LOW (ref >60)
GFR calc non Af Amer: 4 mL/min — ABNORMAL LOW (ref >60)
Glucose, Bld: 93 mg/dL (ref 65–99)
Potassium: 3.5 mmol/L (ref 3.5–5.1)
Sodium: 134 mmol/L — ABNORMAL LOW (ref 135–145)

## 2015-06-23 MED ORDER — ACETAMINOPHEN 325 MG PO TABS: 650.0000 mg | ORAL_TABLET | Freq: Four times a day (QID) | ORAL | Status: DC | PRN

## 2015-06-23 MED ORDER — PANTOPRAZOLE SODIUM 40 MG PO TBEC
40.0000 mg | DELAYED_RELEASE_TABLET | Freq: Every day | ORAL | Status: DC
  Administered 2015-06-23 – 2015-06-24 (×2): 40 mg via ORAL
  Filled 2015-06-23 (×2): qty 1

## 2015-06-23 MED ORDER — HYDROMORPHONE HCL 1 MG/ML IJ SOLN
0.5000 mg | INTRAMUSCULAR | Status: DC | PRN
  Administered 2015-06-23 (×2): 0.5 mg via INTRAVENOUS
  Filled 2015-06-23 (×2): qty 1

## 2015-06-23 MED ORDER — CALCIUM ACETATE (PHOS BINDER) 667 MG PO CAPS
2001.0000 mg | ORAL_CAPSULE | Freq: Three times a day (TID) | ORAL | Status: DC
  Administered 2015-06-23 (×2): 2001 mg via ORAL
  Filled 2015-06-23 (×3): qty 3

## 2015-06-23 MED ORDER — ONDANSETRON HCL 4 MG/2ML IJ SOLN
4.0000 mg | Freq: Four times a day (QID) | INTRAMUSCULAR | Status: DC | PRN
  Administered 2015-06-23: 4 mg via INTRAVENOUS
  Filled 2015-06-23: qty 2

## 2015-06-23 MED ORDER — FUROSEMIDE 40 MG PO TABS
80.0000 mg | ORAL_TABLET | Freq: Every day | ORAL | Status: DC
  Administered 2015-06-23 – 2015-06-24 (×2): 80 mg via ORAL
  Filled 2015-06-23 (×2): qty 2

## 2015-06-23 MED ORDER — CYCLOBENZAPRINE HCL 10 MG PO TABS: 10.0000 mg | ORAL_TABLET | Freq: Three times a day (TID) | ORAL | Status: DC | PRN

## 2015-06-23 MED ORDER — SODIUM CHLORIDE 0.9 % IV SOLN
INTRAVENOUS | Status: DC
  Administered 2015-06-23: 50 mL/h via INTRAVENOUS

## 2015-06-23 MED ORDER — CINACALCET HCL 30 MG PO TABS
180.0000 mg | ORAL_TABLET | Freq: Every day | ORAL | Status: DC
  Administered 2015-06-23 – 2015-06-24 (×2): 180 mg via ORAL
  Filled 2015-06-23 (×3): qty 6

## 2015-06-23 MED ORDER — HYDROCODONE-ACETAMINOPHEN 5-325 MG PO TABS
1.0000 | ORAL_TABLET | ORAL | Status: DC | PRN
  Administered 2015-06-23: 1 via ORAL
  Filled 2015-06-23: qty 1

## 2015-06-23 MED ORDER — ENSURE ENLIVE PO LIQD
237.0000 mL | Freq: Two times a day (BID) | ORAL | Status: DC
  Administered 2015-06-23: 237 mL via ORAL

## 2015-06-23 MED ORDER — ALBUTEROL SULFATE (2.5 MG/3ML) 0.083% IN NEBU: 3.0000 mL | INHALATION_SOLUTION | Freq: Four times a day (QID) | RESPIRATORY_TRACT | Status: DC | PRN

## 2015-06-23 MED ORDER — ALPRAZOLAM 0.25 MG PO TABS: 0.2500 mg | ORAL_TABLET | Freq: Three times a day (TID) | ORAL | Status: DC | PRN

## 2015-06-23 MED ORDER — ONDANSETRON HCL 4 MG PO TABS
4.0000 mg | ORAL_TABLET | Freq: Four times a day (QID) | ORAL | Status: DC | PRN
  Filled 2015-06-23: qty 1

## 2015-06-23 MED ORDER — SEVELAMER CARBONATE 800 MG PO TABS
800.0000 mg | ORAL_TABLET | Freq: Three times a day (TID) | ORAL | Status: DC
  Administered 2015-06-23 (×2): 800 mg via ORAL
  Filled 2015-06-23 (×3): qty 1

## 2015-06-23 MED ORDER — METOPROLOL SUCCINATE ER 25 MG PO TB24
25.0000 mg | ORAL_TABLET | Freq: Every day | ORAL | Status: DC
  Administered 2015-06-23: 25 mg via ORAL
  Filled 2015-06-23: qty 1

## 2015-06-23 MED ORDER — HEPARIN SODIUM (PORCINE) 5000 UNIT/ML IJ SOLN
5000.0000 [IU] | Freq: Three times a day (TID) | INTRAMUSCULAR | Status: DC
Start: 2015-06-23 — End: 2015-06-24
  Administered 2015-06-23 – 2015-06-24 (×2): 5000 [IU] via SUBCUTANEOUS
  Filled 2015-06-23 (×2): qty 1

## 2015-06-23 MED ORDER — ACETAMINOPHEN 650 MG RE SUPP: 650.0000 mg | Freq: Four times a day (QID) | RECTAL | Status: DC | PRN

## 2015-06-23 MED ORDER — CEFTRIAXONE SODIUM IN DEXTROSE 40 MG/ML IV SOLN
2.0000 g | INTRAVENOUS | Status: DC
  Administered 2015-06-23 – 2015-06-24 (×2): 2 g via INTRAVENOUS
  Filled 2015-06-23 (×3): qty 50

## 2015-06-23 NOTE — Progress Notes (Signed)
Pre-hd tx 

## 2015-06-23 NOTE — Progress Notes (Signed)
Subjective:     Objective:  Vital signs in last 24 hours:  Temp:  [97.7 F (36.5 C)-98.4 F (36.9 C)] 97.7 F (36.5 C) (07/09 0739) Pulse Rate:  [74-88] 81 (07/09 0739) Resp:  [14-23] 16 (07/09 0739) BP: (86-110)/(62-84) 103/80 mmHg (07/09 0739) SpO2:  [99 %-100 %] 99 % (07/09 0739) Weight:  [49.76 kg (109 lb 11.2 oz)-52.164 kg (115 lb)] 49.76 kg (109 lb 11.2 oz) (07/09 0044)  Weight change:  Filed Weights   06/22/15 1511 06/23/15 0044  Weight: 52.164 kg (115 lb) 49.76 kg (109 lb 11.2 oz)    Intake/Output: I/O last 3 completed shifts: In: 304.2 [I.V.:304.2] Out: 0      Physical Exam: General: NAD  HEENT Anicteric, moist mucus membranes  Neck supple  Pulm/lungs Clear, normal effort  CVS/Heart Irregular, no rub  Abdomen:  Soft,    Extremities: No edema  Neurologic: Alert, oreinted  Skin: No rashes  Access: PD cathter, left forearm AVF       Basic Metabolic Panel:  Recent Labs Lab 06/22/15 1515 06/23/15 0646  NA 133* 134*  K 2.9* 3.5  CL 93* 97*  CO2 22 20*  GLUCOSE 101* 93  BUN 60* 67*  CREATININE 9.52* 9.98*  CALCIUM 6.6* 7.0*     CBC:  Recent Labs Lab 06/22/15 1515 06/23/15 0646  WBC 7.4 6.3  NEUTROABS 5.9  --   HGB 9.6* 8.5*  HCT 29.2* 26.2*  MCV 86.3 86.7  PLT 148* 111*      Microbiology: Results for orders placed or performed during the hospital encounter of 06/22/15  Body fluid culture     Status: None (Preliminary result)   Collection Time: 06/22/15  6:55 PM  Result Value Ref Range Status   Specimen Description PERITONEAL DIALYSATE  Final   Special Requests Normal  Final   Gram Stain PENDING  Incomplete   Culture NO GROWTH <24 HRS  Final   Report Status PENDING  Incomplete    Coagulation Studies: No results for input(s): LABPROT, INR in the last 72 hours.  Urinalysis: No results for input(s): COLORURINE, LABSPEC, PHURINE, GLUCOSEU, HGBUR, BILIRUBINUR, KETONESUR, PROTEINUR, UROBILINOGEN, NITRITE, LEUKOCYTESUR in the  last 72 hours.  Invalid input(s): APPERANCEUR    Imaging: Ct Abdomen Pelvis Wo Contrast  06/22/2015   CLINICAL DATA:  Bold abdominal pain, nausea and vomiting, recently discharge from hospital. Diarrhea. History of end-stage renal disease on peritoneal dialysis, COPD.  EXAM: CT ABDOMEN AND PELVIS WITHOUT CONTRAST  TECHNIQUE: Multidetector CT imaging of the abdomen and pelvis was performed following the standard protocol without IV contrast.  COMPARISON:  CT abdomen and pelvis June 15, 2015 and June 14, 2015.  FINDINGS: LUNG BASES: Included view of the chest demonstrates moderate cardiomegaly. Small pericardial effusion was present previously.  KIDNEYS/BLADDER: Kidneys are orthotopic, symmetrically atrophic. No nephrolithiasis, hydronephrosis; limited assessment for renal masses on this nonenhanced examination. The unopacified ureters are normal in course and caliber. Urinary bladder is decompressed and unremarkable.  SOLID ORGANS: The liver is mildly nodular, with enlarged LEFT lobe. Spleen, pancreas and adrenal glands are unremarkable for this non-contrast examination. Status post cholecystectomy.  GASTROINTESTINAL TRACT: The stomach, small and large bowel are normal in course and caliber without inflammatory changes, the sensitivity may be decreased by lack of enteric contrast. Fluid within the small and large bowel. Normal appendix containing contrast in a air.  PERITONEUM/RETROPERITONEUM: Aortoiliac vessels are normal in course and caliber, moderate calcific atherosclerosis. No lymphadenopathy by CT size criteria. Status post hysterectomy. No  intraperitoneal free fluid nor free air. No residual intraperitoneal contrast.  SOFT TISSUES/ OSSEOUS STRUCTURES: Nonsuspicious. Peritoneal dialysis catheter. 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 Small fat containing umbilical hernia. Small LEFT femoral hernia without bowel.  IMPRESSION: Fluid filled nondistended small  and large bowel may represent enteritis without bowel obstruction.  No residual peritoneal contrast, peritoneal dialysis catheter in place with atrophic native kidneys.  Cirrhosis.  Cardiomegaly with small, stable pericardial effusion.   Electronically Signed   By: Awilda Metro M.D.   On: 06/22/2015 22:56     Medications:     . calcium acetate  2,001 mg Oral TID WC  . cefTRIAXone (ROCEPHIN)  IV  2 g Intravenous Q24H  . cinacalcet  180 mg Oral Q breakfast  . feeding supplement (ENSURE ENLIVE)  237 mL Oral BID BM  . furosemide  80 mg Oral Daily  . heparin  5,000 Units Subcutaneous 3 times per day  . metoprolol succinate  25 mg Oral Daily  . pantoprazole  40 mg Oral Daily  . sevelamer carbonate  800 mg Oral TID WC   acetaminophen **OR** acetaminophen, albuterol, ALPRAZolam, cyclobenzaprine, HYDROcodone-acetaminophen, HYDROmorphone (DILAUDID) injection, ondansetron **OR** ondansetron (ZOFRAN) IV  Assessment/ Plan:  54 y.o. female with medical problems of of hypertension, hyperlipidemia, vitamin D deficiency, tobacco abuse, edema, cholecystectomy,hysterectomy, ESRD secondary to FSGS, SHPTH, severe mitral stenosis. Liver cirrhosis  1. ESRD on PD - switch to Hemodialysis until hernia is repaired - outpatient HD d.c planning 2. Abdominal / left femoral hernia -  Outpatient surgery after current infection is cleared 3. Abdominal pain (RLQ) - ? Peritonitis. Neutrophils are only 20 %. Will repeat assessment 4. AOCKD- will resume SQ EPO 5. SHPTH - monitor phos. Phoslo. Sensipar 150 mg with meals (outpatient dose) 6. Hypokalemia - regular diet - Kcl supplementation prn - 4 k bath    LOS: 1 Khalil Szczepanik 7/9/201611:41 AM

## 2015-06-23 NOTE — Progress Notes (Signed)
HD tx completed.

## 2015-06-23 NOTE — Progress Notes (Signed)
Telemetry was place back onto pt. Pt disconnected monitoring after verbalizing understanding and having monitor placed back on.  Notified Dr. Allena KatzPatel, MD, that pt continues to remove telemetry device and also walk off unit. Received new order to discontinue telemetry order and reinforce that the pt is to remain on the unit floor.

## 2015-06-23 NOTE — Progress Notes (Addendum)
[facility] [place] [facility]   PATIENT NAME: Dorothy Long    MR#:  [id number]  DATE OF BIRTH:  [birth year]  SUBJECTIVE:  Nausea with lower abd pain  REVIEW OF SYSTEMS:   Review of Systems  Constitutional: Negative for fever, chills and weight loss.  HENT: Negative for ear discharge, ear pain and nosebleeds.   Eyes: Negative for blurred vision, pain and discharge.  Respiratory: Negative for sputum production, shortness of breath, wheezing and stridor.   Cardiovascular: Negative for chest pain, palpitations, orthopnea and PND.  Gastrointestinal: Positive for nausea and abdominal pain. Negative for vomiting and diarrhea.  Genitourinary: Negative for urgency and frequency.  Musculoskeletal: Negative for back pain and joint pain.  Neurological: Positive for weakness. Negative for sensory change, speech change and focal weakness.  Psychiatric/Behavioral: Negative for depression and hallucinations. The patient is not nervous/anxious.   All other systems reviewed and are negative.  Tolerating Diet:yes  DRUG ALLERGIES:   Allergies  Allergen Reactions  . Ciprocinonide [Fluocinolone]   . Demerol [Meperidine]   . Diflucan [Fluconazole]   . Flagyl [Metronidazole]   . Levaquin [Levofloxacin In D5w] Other (See Comments)    Numbness in legs  . Morphine And Related Nausea And Vomiting  . Tetracyclines & Related   . Valium [Diazepam]     VITALS:  Blood pressure 85/51, pulse 70, temperature 98.5 F (36.9 C), temperature source Oral, resp. rate 17, height  (1.778 m), weight 49.7 kg (109 lb 9.1 oz), SpO2 100 %.  PHYSICAL EXAMINATION:   Physical Exam  GENERAL:  54 y.o.-year-old patient lying in the bed with no acute distress.  EYES: Pupils equal, round, reactive to light and accommodation. No scleral icterus. Extraocular muscles intact.  HEENT: Head atraumatic, normocephalic. Oropharynx and nasopharynx clear.  NECK:  Supple, no jugular venous  distention. No thyroid enlargement, no tenderness.  LUNGS: Normal breath sounds bilaterally, no wheezing, rales, rhonchi. No use of accessory muscles of respiration.  CARDIOVASCULAR: S1, S2 normal. No murmurs, rubs, or gallops.  ABDOMEN: Soft, nontender, nondistended. Bowel sounds present. No organomegaly or mass. PD cath + EXTREMITIES: No cyanosis, clubbing or edema b/l.    NEUROLOGIC: Cranial nerves II through XII are intact. No focal Motor or sensory deficits b/l.   PSYCHIATRIC: The patient is alert and oriented x 3.  SKIN: No obvious rash, lesion, or ulcer.    LABORATORY PANEL:   CBC  Recent Labs Lab 06/23/15 0646  WBC 6.3  HGB 8.5*  HCT 26.2*  PLT 111*    Chemistries   Recent Labs Lab 06/22/15 1515 06/23/15 0646  NA 133* 134*  K 2.9* 3.5  CL 93* 97*  CO2 22 20*  GLUCOSE 101* 93  BUN 60* 67*  CREATININE 9.52* 9.98*  CALCIUM 6.6* 7.0*  AST 27  --   ALT 16  --   ALKPHOS 135*  --   BILITOT 0.5  --     Cardiac Enzymes No results for input(s): TROPONINI in the last 168 hours.  RADIOLOGY:  Ct Abdomen Pelvis Wo Contrast  06/22/2015   CLINICAL DATA:  Bold abdominal pain, nausea and vomiting, recently discharge from hospital. Diarrhea. History of end-stage renal disease on peritoneal dialysis, COPD.  EXAM: CT ABDOMEN AND PELVIS WITHOUT CONTRAST  TECHNIQUE: Multidetector CT imaging of the abdomen and pelvis was performed following the standard protocol without IV contrast.  COMPARISON:  CT abdomen and pelvis June 15, 2015 and June 14, 2015.  FINDINGS: LUNG BASES: Included view  of the chest demonstrates moderate cardiomegaly. Small pericardial effusion was present previously.  KIDNEYS/BLADDER: Kidneys are orthotopic, symmetrically atrophic. No nephrolithiasis, hydronephrosis; limited assessment for renal masses on this nonenhanced examination. The unopacified ureters are normal in course and caliber. Urinary bladder is decompressed and unremarkable.  SOLID ORGANS: The liver is  mildly nodular, with enlarged LEFT lobe. Spleen, pancreas and adrenal glands are unremarkable for this non-contrast examination. Status post cholecystectomy.  GASTROINTESTINAL TRACT: The stomach, small and large bowel are normal in course and caliber without inflammatory changes, the sensitivity may be decreased by lack of enteric contrast. Fluid within the small and large bowel. Normal appendix containing contrast in a air.  PERITONEUM/RETROPERITONEUM: Aortoiliac vessels are normal in course and caliber, moderate calcific atherosclerosis. No lymphadenopathy by CT size criteria. Status post hysterectomy. No intraperitoneal free fluid nor free air. No residual intraperitoneal contrast.  SOFT TISSUES/ OSSEOUS STRUCTURES: Nonsuspicious. Peritoneal dialysis catheter. 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 Small fat containing umbilical hernia. Small LEFT femoral hernia without bowel.  IMPRESSION: Fluid filled nondistended small and large bowel may represent enteritis without bowel obstruction.  No residual peritoneal contrast, peritoneal dialysis catheter in place with atrophic native kidneys.  Cirrhosis.  Cardiomegaly with small, stable pericardial effusion.   Electronically Signed   By: Awilda Metroourtnay  Bloomer M.D.   On: 06/22/2015 22:56     ASSESSMENT AND PLAN:   * suspected SBP (spontaneous bacterial peritonitis) - elevated white blood cell count and her peritoneal fluid shows WBC of 400, repeat count pending, in conjunction with her chills, and abdominal pain with nausea vomiting. Per nephrology's recommendations on IV rocephin -Peritoneal fluid culture negative  *End stage kidney disease - on peritoneal dialysis, consult nephrology for dialysis support.  * Nausea & vomiting - patient is responded well to Zofran.   *Hypertension - resume home meds  * Left inguinal hernia - patient will need GEN SURGERY TO SEE  As ou pt. She will make appt with dr  byrnett  * Hyperlipidemia - continue home meds  * GERD (gastroesophageal reflux disease) - PPI while here  *Paroxysmal atrial fibrillation - continue home dose of Toprol for rate control.    Management plans discussed with the patient, family and they are in agreement.  CODE STATUS: FULL  DVT Prophylaxis: HEPARIN  TOTAL TIME TAKING CARE OF THIS PATIENT: 35minutes.  >50% time spent on counselling and coordination of care  Daly Whipkey M.D on 06/23/2015 at 2:05 PM  Between 7am to 6pm - Pager - [phone]  After 6pm go to [email or web address] - password [email or web address] [facility]  [facility]  Office  [phone]  CC: Primary care physician; Sherlene ShamsULLO, TERESA L, MD

## 2015-06-23 NOTE — Progress Notes (Signed)
Initial Nutrition Assessment  INTERVENTION:  Meals and Snacks: Cater to patient preferences; MD Thedore MinsSingh agreeable to regular diet per pt request Medical Food Supplement Therapy: will recommend Ensure Enlive BID for added nutrition as pt drinks and prefers Boost at home PTA Ensure Enlive (each supplement provides 350kcal and 20 grams of protein)  Education: Educated pt on potassium content of foods as pt asking about certain foods. Provided pt with a list of high, moderate and low potassium content foods on visit.   NUTRITION DIAGNOSIS:  Inadequate oral intake related to altered GI function as evidenced by per patient/family report.  GOAL:  Patient will meet greater than or equal to 90% of their needs  MONITOR:   (Energy Intake, Digestive system, Anthropometrics, UOP, Electrolyte and Renal Profile)  REASON FOR ASSESSMENT:  Malnutrition Screening Tool    ASSESSMENT:  Pt admitted with abdominal pain and n/v for the past 3-4 days PTA with peritonitis per MD note. Pt with elevated WBC. Pt with ESRD on PD PTA; pt scheduled for HD today. Per MD note, pt in need for inguinal hernia repair PTA. PMHx:  Past Medical History  Diagnosis Date  . Hypertension   . Hyperlipidemia   . COPD (chronic obstructive pulmonary disease)   . Anemia   . Mitral valve stenosis, severe   . CHF (congestive heart failure)   . Chronic kidney disease   . Acute on chronic renal failure   . Diastolic congestive heart failure   . A-fib   . Dialysis patient   . Diabetes mellitus without complication     Diet Order: Regular  Current Nutrition: Pt ate biscuit from community ([place] this am. Pt reports she will not eat foods on the 'renal diet order' and that outpatient nephrology allows her regular food items.  Food/Nutrition-Related History: Pt reports decreased appetite for the past week or so secondary to stomach pain, noted per MD note abdominal pain for 4 days. Difficult to determine exact intake  as pt reports eating regular diet PTA and not restricting for electrolytes. Pt reports drinking Boost supplement drinks daily.   Medications: Phoslo, Sensipar, Renvela, NS at 2150mL/hr, Lasix, Protonix  Electrolyte/Renal Profile and Glucose Profile:   Recent Labs Lab 06/22/15 1515 06/23/15 0646  NA 133* 134*  K 2.9* 3.5  CL 93* 97*  CO2 22 20*  BUN 60* 67*  CREATININE 9.52* 9.98*  CALCIUM 6.6* 7.0*  GLUCOSE 101* 93   Protein Profile:  Recent Labs Lab 06/22/15 1515  ALBUMIN 2.6*   Nutritional Anemia Profile:  CBC Latest Ref Rng 06/23/2015 06/22/2015 06/15/2015  WBC 3.6 - 11.0 K/uL 6.3 7.4 6.7  Hemoglobin 12.0 - 16.0 g/dL 1.6(X8.5(L) 0.9(U9.6(L) 0.4(V8.8(L)  Hematocrit 35.0 - 47.0 % 26.2(L) 29.2(L) 27.2(L)  Platelets 150 - 440 K/uL 111(L) 148(L) 161    Gastrointestinal Profile: Last BM: 7/8   Nutrition-Focused Physical Exam Findings: Nutrition-Focused physical exam completed. Findings are WDL for fat depletion, severe muscle depletion of calf, clavicle and shoulder regions with mild/moderate depletion of thigh and scapular region , and no edema.     Weight Change: Pt reports weight of 122lbs usual dry weight. Per CHL 122lbs 4 months ago; pt reports weight loss of 8lbs. From 122lbs (7% weight loss) Anthropometrics:    Height:  Ht Readings from Last 1 Encounters:  06/23/15 5\' 10"  (1.778 m)    Weight:  Wt Readings from Last 1 Encounters:  06/23/15 109 lb 11.2 oz (49.76 kg)   Ideal Body weight: 68.2kg  Wt Readings from  Last 10 Encounters:  06/23/15 109 lb 11.2 oz (49.76 kg)  06/22/15 112 lb 4 oz (50.916 kg)  06/16/15 120 lb 1.6 oz (54.477 kg)  05/21/15 117 lb 12 oz (53.411 kg)  03/12/15 122 lb 8 oz (55.566 kg)  02/19/15 125 lb 8 oz (56.926 kg)  02/14/15 119 lb (53.978 kg)  01/10/15 121 lb (54.885 kg)  11/27/14 127 lb 8 oz (57.834 kg)  11/06/14 122 lb (55.339 kg)    BMI:  Body mass index is 15.74 kg/(m^2).  Estimated Nutritional Needs:  Kcal:  1962-2319kcals, BEE:  1373kcals, TEE: (IF 1.1-1.3)(AF 1.3) using IBW of 68.2kg  Protein:  82-102g protein (1.2-1.5g/kg) using IBW of 68.2kg  Fluid:  UOP+1020mL  Skin:  Reviewed, no issues  Diet Order:  Diet regular Room service appropriate?: Yes; Fluid consistency:: Thin  EDUCATION NEEDS:  Education needs addressed    MODERATE Care Level  Leda Quail, RD, LDN Pager [phone]

## 2015-06-23 NOTE — Progress Notes (Signed)
Pt removed telemetry monitor herself. asked pt where telemetry monitor was in room, pt stated she did not know.  Pt verbalized understanding that she should have equipment on. Pt continued to ambulate in hall following conversation. Will reinforce to pt the need for monitoring and ensure placement upon return from ambulation.

## 2015-06-24 LAB — HEPATITIS B SURFACE ANTIGEN: Hepatitis B Surface Ag: NEGATIVE

## 2015-06-24 LAB — HEPATITIS B CORE ANTIBODY, TOTAL: Hep B Core Total Ab: NEGATIVE

## 2015-06-24 LAB — HEPATITIS B SURFACE ANTIBODY,QUALITATIVE: HEP B S AB: REACTIVE

## 2015-06-24 MED ORDER — ONDANSETRON HCL 4 MG PO TABS: 4.0000 mg | ORAL_TABLET | Freq: Four times a day (QID) | ORAL | Status: DC | PRN

## 2015-06-24 NOTE — Discharge Instructions (Signed)
You will be started on HD from tomorrow

## 2015-06-24 NOTE — Progress Notes (Signed)
A&O. VSS. Tolerating diet well. No complaints of pain or nausea. Resting comfortably ambulating unassisted with steady gait. Family at bedside. Left arm fistula intact and dry. Discharged per MD orders. Discharge instructions reviewed with pt and pt verbalized understanding. IV removed per policy. Prescription called into pharmacy via Dr. Allena KatzPatel. Discharged via wheelchair escorted by auxilary.

## 2015-06-24 NOTE — Progress Notes (Signed)
Subjective:   Doing well No abdominal pain or tenderness Repeat fluid analysis shows total WBC 50 with 20 % neutrophils  Objective:  Vital signs in last 24 hours:  Temp:  [97.9 F (36.6 C)-98.5 F (36.9 C)] 97.9 F (36.6 C) (07/10 0859) Pulse Rate:  [67-76] 70 (07/10 0859) Resp:  [13-20] 17 (07/10 0859) BP: (81-100)/(51-70) 94/63 mmHg (07/10 0859) SpO2:  [98 %-100 %] 100 % (07/10 0859) Weight:  [49.7 kg (109 lb 9.1 oz)] 49.7 kg (109 lb 9.1 oz) (07/09 1230)  Weight change: -2.464 kg (-5 lb 6.9 oz) Filed Weights   06/22/15 1511 06/23/15 0044 06/23/15 1230  Weight: 52.164 kg (115 lb) 49.76 kg (109 lb 11.2 oz) 49.7 kg (109 lb 9.1 oz)    Intake/Output: I/O last 3 completed shifts: In: 544.2 [P.O.:240; I.V.:304.2] Out: -394 [Urine:50]     Physical Exam: General: NAD  HEENT Anicteric, moist mucus membranes  Neck supple  Pulm/lungs Clear, normal effort  CVS/Heart Irregular, no rub  Abdomen:  Soft,  Non tender  Extremities: No edema  Neurologic: Alert, oreinted  Skin: No rashes  Access: PD cathter, no tunnel tenderness,left forearm AVF       Basic Metabolic Panel:  Recent Labs Lab 06/22/15 1515 06/23/15 0646  NA 133* 134*  K 2.9* 3.5  CL 93* 97*  CO2 22 20*  GLUCOSE 101* 93  BUN 60* 67*  CREATININE 9.52* 9.98*  CALCIUM 6.6* 7.0*     CBC:  Recent Labs Lab 06/22/15 1515 06/23/15 0646  WBC 7.4 6.3  NEUTROABS 5.9  --   HGB 9.6* 8.5*  HCT 29.2* 26.2*  MCV 86.3 86.7  PLT 148* 111*      Microbiology: Results for orders placed or performed during the hospital encounter of 06/22/15  Body fluid culture     Status: None (Preliminary result)   Collection Time: 06/22/15  6:55 PM  Result Value Ref Range Status   Specimen Description PERITONEAL DIALYSATE  Final   Special Requests Normal  Final   Gram Stain NO ORGANISMS SEEN NO WBC SEEN   Final   Culture NO GROWTH 2 DAYS  Final   Report Status PENDING  Incomplete  Body fluid culture     Status: None  (Preliminary result)   Collection Time: 06/23/15  3:21 PM  Result Value Ref Range Status   Specimen Description PERITONEAL DIALYSATE  Final   Special Requests Normal  Final   Gram Stain PENDING  Incomplete   Culture NO GROWTH < 24 HOURS  Final   Report Status PENDING  Incomplete    Coagulation Studies: No results for input(s): LABPROT, INR in the last 72 hours.  Urinalysis: No results for input(s): COLORURINE, LABSPEC, PHURINE, GLUCOSEU, HGBUR, BILIRUBINUR, KETONESUR, PROTEINUR, UROBILINOGEN, NITRITE, LEUKOCYTESUR in the last 72 hours.  Invalid input(s): APPERANCEUR    Imaging: Ct Abdomen Pelvis Wo Contrast  06/22/2015   CLINICAL DATA:  Bold abdominal pain, nausea and vomiting, recently discharge from hospital. Diarrhea. History of end-stage renal disease on peritoneal dialysis, COPD.  EXAM: CT ABDOMEN AND PELVIS WITHOUT CONTRAST  TECHNIQUE: Multidetector CT imaging of the abdomen and pelvis was performed following the standard protocol without IV contrast.  COMPARISON:  CT abdomen and pelvis June 15, 2015 and June 14, 2015.  FINDINGS: LUNG BASES: Included view of the chest demonstrates moderate cardiomegaly. Small pericardial effusion was present previously.  KIDNEYS/BLADDER: Kidneys are orthotopic, symmetrically atrophic. No nephrolithiasis, hydronephrosis; limited assessment for renal masses on this nonenhanced examination. The unopacified ureters are  normal in course and caliber. Urinary bladder is decompressed and unremarkable.  SOLID ORGANS: The liver is mildly nodular, with enlarged LEFT lobe. Spleen, pancreas and adrenal glands are unremarkable for this non-contrast examination. Status post cholecystectomy.  GASTROINTESTINAL TRACT: The stomach, small and large bowel are normal in course and caliber without inflammatory changes, the sensitivity may be decreased by lack of enteric contrast. Fluid within the small and large bowel. Normal appendix containing contrast in a air.   PERITONEUM/RETROPERITONEUM: Aortoiliac vessels are normal in course and caliber, moderate calcific atherosclerosis. No lymphadenopathy by CT size criteria. Status post hysterectomy. No intraperitoneal free fluid nor free air. No residual intraperitoneal contrast.  SOFT TISSUES/ OSSEOUS STRUCTURES: Nonsuspicious. Peritoneal dialysis catheter. 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 Small fat containing umbilical hernia. Small LEFT femoral hernia without bowel.  IMPRESSION: Fluid filled nondistended small and large bowel may represent enteritis without bowel obstruction.  No residual peritoneal contrast, peritoneal dialysis catheter in place with atrophic native kidneys.  Cirrhosis.  Cardiomegaly with small, stable pericardial effusion.   Electronically Signed   By: Awilda Metro M.D.   On: 06/22/2015 22:56     Medications:     . calcium acetate  2,001 mg Oral TID WC  . cinacalcet  180 mg Oral Q breakfast  . feeding supplement (ENSURE ENLIVE)  237 mL Oral BID BM  . furosemide  80 mg Oral Daily  . heparin  5,000 Units Subcutaneous 3 times per day  . metoprolol succinate  25 mg Oral Daily  . pantoprazole  40 mg Oral Daily  . sevelamer carbonate  800 mg Oral TID WC   acetaminophen **OR** acetaminophen, albuterol, ALPRAZolam, cyclobenzaprine, HYDROcodone-acetaminophen, HYDROmorphone (DILAUDID) injection, ondansetron **OR** ondansetron (ZOFRAN) IV  Assessment/ Plan:  54 y.o. female with medical problems of of hypertension, hyperlipidemia, vitamin D deficiency, tobacco abuse, edema, cholecystectomy,hysterectomy, ESRD secondary to FSGS, SHPTH, severe mitral stenosis. Liver cirrhosis  1. ESRD on PD - switch to Hemodialysis until hernia is repaired. Patient will contact Dr Rutherford Nail office - outpatient HD d.c planning 2. Abdominal / left femoral hernia -  Outpatient surgery after current infection is cleared 3. Abdominal pain (RLQ) - resolved. No pain  at present. Repeat fluid analysis not c/w peritonitis.  No Abx necessary 4. AOCKD- will resume SQ EPO 5. SHPTH - monitor phos. Phoslo. Sensipar 150 mg with meals (outpatient dose) 6. Hypokalemia - regular diet - Kcl supplementation prn- called in to  outpatient pharmacy      LOS: 2 Harutyun Monteverde 7/10/201610:54 AM

## 2015-06-24 NOTE — Discharge Summary (Signed)
[facility] - [place] at [facility]   PATIENT NAME: Dorothy Long    MR#:  [id number]  DATE OF BIRTH:  [birth year]  DATE OF ADMISSION:  06/22/2015 ADMITTING PHYSICIAN: Oralia Manis, MD  DATE OF DISCHARGE: 06/24/15  PRIMARY CARE PHYSICIAN: Sherlene Shams, MD    ADMISSION DIAGNOSIS:  Peritonitis [K65.9]  DISCHARGE DIAGNOSIS:  Abdominal pain likley due to left Inguinal hernia Nausea ESRD on PD (now to be started on HD)  SECONDARY DIAGNOSIS:   Past Medical History  Diagnosis Date  . Hypertension   . Hyperlipidemia   . COPD (chronic obstructive pulmonary disease)   . Anemia   . Mitral valve stenosis, severe   . CHF (congestive heart failure)   . Chronic kidney disease   . Acute on chronic renal failure   . Diastolic congestive heart failure   . A-fib   . Dialysis patient   . Diabetes mellitus without complication     HOSPITAL COURSE:   *generalzied abdominal pain was suspected due to SBP (spontaneous bacterial peritonitis) - elevated white blood cell count and her peritoneal fluid shows WBC of 400, repeat count 52 - in conjunction with her chills, and abdominal pain with nausea vomiting, Per nephrology's recommendations on IV rocephin -Peritoneal fluid culture negative -no indications for abxs now. No evidence of SBP per fluid results  *End stage kidney disease - on peritoneal dialysis, consult nephrology for dialysis support.  * Nausea & vomiting - patient is responded well to Zofran.   *Hypertension - resume home meds  * Left inguinal hernia - patient will need GEN SURGERY TO SEE As ou pt. She will make appt with dr byrnett  * Hyperlipidemia - continue home meds  * GERD (gastroesophageal reflux disease) - PPI while here  *Paroxysmal atrial fibrillation - continue home dose of Toprol for rate control. Overall better D/c home DISCHARGE CONDITIONS:   fair  CONSULTS OBTAINED:  Treatment Team:  Mosetta Pigeon, MD  DRUG  ALLERGIES:   Allergies  Allergen Reactions  . Ciprocinonide [Fluocinolone]   . Demerol [Meperidine]   . Diflucan [Fluconazole]   . Flagyl [Metronidazole]   . Levaquin [Levofloxacin In D5w] Other (See Comments)    Numbness in legs  . Morphine And Related Nausea And Vomiting  . Tetracyclines & Related   . Valium [Diazepam]     DISCHARGE MEDICATIONS:   Current Discharge Medication List    START taking these medications   Details  !! ondansetron (ZOFRAN) 4 MG tablet Take 1 tablet (4 mg total) by mouth every 6 (six) hours as needed for nausea. Qty: 20 tablet, Refills: 0     !! - Potential duplicate medications found. Please discuss with provider.    CONTINUE these medications which have NOT CHANGED   Details  acetaminophen (TYLENOL) 325 MG tablet Take 325 mg by mouth every 4 (four) hours as needed for mild pain.     albuterol (VENTOLIN HFA) 108 (90 BASE) MCG/ACT inhaler Inhale 1-2 puffs into the lungs every 6 (six) hours as needed for wheezing or shortness of breath.     ALPRAZolam (XANAX) 0.25 MG tablet Take 1 tablet (0.25 mg total) by mouth 3 (three) times daily as needed for sleep. Qty: 30 tablet, Refills: 0    Calcium Acetate 667 MG TABS Take 3 capsules by mouth 3 (three) times daily.    cinacalcet (SENSIPAR) 90 MG tablet Take 180 mg by mouth daily. With the largest meal of the day    diphenhydrAMINE (BENADRYL)  25 MG tablet Take 25 mg by mouth every other day. MON, WED, FRI BEFORE DIALYSIS    furosemide (LASIX) 80 MG tablet Take 80 mg by mouth daily.    HYDROcodone-acetaminophen (NORCO/VICODIN) 5-325 MG per tablet Take 1 tablet by mouth every 4 (four) hours as needed for moderate pain.     metoprolol succinate (TOPROL-XL) 25 MG 24 hr tablet TAKE 1 TABLET (25 MG TOTAL) BY MOUTH DAILY. Qty: 30 tablet, Refills: 5    !! ondansetron (ZOFRAN) 4 MG tablet Take 1 tablet (4 mg total) by mouth every 6 (six) hours as needed for nausea or vomiting. Qty: 20 tablet, Refills: 0     RENVELA 800 MG tablet Take 1 tablet by mouth 3 (three) times daily with meals.    cyclobenzaprine (FLEXERIL) 10 MG tablet Take 1 tablet (10 mg total) by mouth 3 (three) times daily as needed for muscle spasms. Qty: 30 tablet, Refills: 0     !! - Potential duplicate medications found. Please discuss with provider.      If you experience worsening of your admission symptoms, develop shortness of breath, life threatening emergency, suicidal or homicidal thoughts you must seek medical attention immediately by calling 911 or calling your MD immediately  if symptoms less severe.  You Must read complete instructions/literature along with all the possible adverse reactions/side effects for all the Medicines you take and that have been prescribed to you. Take any new Medicines after you have completely understood and accept all the possible adverse reactions/side effects.   Please note  You were cared for by a hospitalist during your hospital stay. If you have any questions about your discharge medications or the care you received while you were in the hospital after you are discharged, you can call the unit and asked to speak with the hospitalist on call if the hospitalist that took care of you is not available. Once you are discharged, your primary care physician will handle any further medical issues. Please note that NO REFILLS for any discharge medications will be authorized once you are discharged, as it is imperative that you return to your primary care physician (or establish a relationship with a primary care physician if you do not have one) for your aftercare needs so that they can reassess your need for medications and monitor your lab values. Today   SUBJECTIVE   Feels overall ok other than mild nausea. tolerating outside food  VITAL SIGNS:  Blood pressure 94/63, pulse 70, temperature 97.9 F (36.6 C), temperature source Oral, resp. rate 17, height 5\' 10"  (1.778 m), weight 49.7 kg (109  lb 9.1 oz), SpO2 100 %.  I/O:   Intake/Output Summary (Last 24 hours) at 06/24/15 1055 Last data filed at 06/24/15 0900  Gross per 24 hour  Intake    240 ml  Output   -394 ml  Net    634 ml    PHYSICAL EXAMINATION:  GENERAL:  54 y.o.-year-old patient lying in the bed with no acute distress.  EYES: Pupils equal, round, reactive to light and accommodation. No scleral icterus. Extraocular muscles intact.  HEENT: Head atraumatic, normocephalic. Oropharynx and nasopharynx clear.  NECK:  Supple, no jugular venous distention. No thyroid enlargement, no tenderness.  LUNGS: Normal breath sounds bilaterally, no wheezing, rales,rhonchi or crepitation. No use of accessory muscles of respiration.  CARDIOVASCULAR: S1, S2 normal. No murmurs, rubs, or gallops.  ABDOMEN: Soft, non-tender, non-distended. Bowel sounds present. No organomegaly or mass.  EXTREMITIES: No pedal edema, cyanosis, or  clubbing.  NEUROLOGIC: Cranial nerves II through XII are intact. Muscle strength 5/5 in all extremities. Sensation intact. Gait not checked.  PSYCHIATRIC: The patient is alert and oriented x 3.  SKIN: No obvious rash, lesion, or ulcer.   DATA REVIEW:   CBC   Recent Labs Lab 06/23/15 0646  WBC 6.3  HGB 8.5*  HCT 26.2*  PLT 111*    Chemistries   Recent Labs Lab 06/22/15 1515 06/23/15 0646  NA 133* 134*  K 2.9* 3.5  CL 93* 97*  CO2 22 20*  GLUCOSE 101* 93  BUN 60* 67*  CREATININE 9.52* 9.98*  CALCIUM 6.6* 7.0*  AST 27  --   ALT 16  --   ALKPHOS 135*  --   BILITOT 0.5  --     Microbiology Results   Recent Results (from the past 240 hour(s))  Body fluid culture     Status: None   Collection Time: 06/15/15  1:36 PM  Result Value Ref Range Status   Specimen Description PERITONEAL  Final   Special Requests Normal  Final   Gram Stain FEW WBC SEEN NO ORGANISMS SEEN   Final   Culture NO GROWTH 3 DAYS  Final   Report Status 06/18/2015 FINAL  Final  Body fluid culture     Status: None  (Preliminary result)   Collection Time: 06/22/15  6:55 PM  Result Value Ref Range Status   Specimen Description PERITONEAL DIALYSATE  Final   Special Requests Normal  Final   Gram Stain NO ORGANISMS SEEN NO WBC SEEN   Final   Culture NO GROWTH 2 DAYS  Final   Report Status PENDING  Incomplete  Body fluid culture     Status: None (Preliminary result)   Collection Time: 06/23/15  3:21 PM  Result Value Ref Range Status   Specimen Description PERITONEAL DIALYSATE  Final   Special Requests Normal  Final   Gram Stain PENDING  Incomplete   Culture NO GROWTH < 24 HOURS  Final   Report Status PENDING  Incomplete    RADIOLOGY:  Ct Abdomen Pelvis Wo Contrast  06/22/2015   CLINICAL DATA:  Bold abdominal pain, nausea and vomiting, recently discharge from hospital. Diarrhea. History of end-stage renal disease on peritoneal dialysis, COPD.  EXAM: CT ABDOMEN AND PELVIS WITHOUT CONTRAST  TECHNIQUE: Multidetector CT imaging of the abdomen and pelvis was performed following the standard protocol without IV contrast.  COMPARISON:  CT abdomen and pelvis June 15, 2015 and June 14, 2015.  FINDINGS: LUNG BASES: Included view of the chest demonstrates moderate cardiomegaly. Small pericardial effusion was present previously.  KIDNEYS/BLADDER: Kidneys are orthotopic, symmetrically atrophic. No nephrolithiasis, hydronephrosis; limited assessment for renal masses on this nonenhanced examination. The unopacified ureters are normal in course and caliber. Urinary bladder is decompressed and unremarkable.  SOLID ORGANS: The liver is mildly nodular, with enlarged LEFT lobe. Spleen, pancreas and adrenal glands are unremarkable for this non-contrast examination. Status post cholecystectomy.  GASTROINTESTINAL TRACT: The stomach, small and large bowel are normal in course and caliber without inflammatory changes, the sensitivity may be decreased by lack of enteric contrast. Fluid within the small and large bowel. Normal appendix  containing contrast in a air.  PERITONEUM/RETROPERITONEUM: Aortoiliac vessels are normal in course and caliber, moderate calcific atherosclerosis. No lymphadenopathy by CT size criteria. Status post hysterectomy. No intraperitoneal free fluid nor free air. No residual intraperitoneal contrast.  SOFT TISSUES/ OSSEOUS STRUCTURES: Nonsuspicious. Peritoneal dialysis catheter. Somewhat sclerotic irregular osseous structures consistent with  renal osteodystrophy. Small fat containing umbilical hernia. Small LEFT femoral hernia without bowel.  IMPRESSION: Fluid filled nondistended small and large bowel may represent enteritis without bowel obstruction.  No residual peritoneal contrast, peritoneal dialysis catheter in place with atrophic native kidneys.  Cirrhosis.  Cardiomegaly with small, stable pericardial effusion.   Electronically Signed   By: Awilda Metro M.D.   On: 06/22/2015 22:56     Management plans discussed with the patient, family and they are in agreement.  CODE STATUS:     Code Status Orders        Start     Ordered   06/23/15 0043  Full code   Continuous     06/23/15 0043      TOTAL TIME TAKING CARE OF THIS PATIENT: 40 minutes.    Keeshia Sanderlin M.D on 06/24/2015 at 10:55 AM  Between 7am to 6pm - Pager - [phone] After 6pm go to [email or web address] - password [email or web address] [facility]  [facility]  Office  [phone]  CC: Primary care physician; Sherlene Shams, MD

## 2015-06-25 ENCOUNTER — Encounter: Payer: Self-pay | Admitting: General Surgery

## 2015-06-25 ENCOUNTER — Telehealth: Payer: Self-pay | Admitting: Internal Medicine

## 2015-06-25 LAB — BODY FLUID CULTURE
Culture: NO GROWTH
GRAM STAIN: NONE SEEN
Special Requests: NORMAL

## 2015-06-25 NOTE — Telephone Encounter (Signed)
Pt needs HFU for hernia, d/c 7/11. Pt has dialysis on Tuesday and Thursday's at 12. Please advise/msn

## 2015-06-26 ENCOUNTER — Ambulatory Visit: Payer: Self-pay | Admitting: Gastroenterology

## 2015-06-26 LAB — PATHOLOGIST SMEAR REVIEW

## 2015-06-26 LAB — BODY FLUID CULTURE
Culture: NO GROWTH
Special Requests: NORMAL

## 2015-06-27 ENCOUNTER — Telehealth: Payer: Self-pay

## 2015-06-27 NOTE — Telephone Encounter (Signed)
Previous note sent listed patient to be discharged 06/25/15.  Patient discharged on 06/24/15.   Transition Care Management Follow-up Telephone Call   Date discharged?06/24/15   How have you been since you were released from the hospital? Fatigue at times, but okay.   Do you understand why you were in the hospital? Yes.   Do you understand the discharge instrcutions? Yes   Where were you discharged to? Home   Items Reviewed:  Medications reviewed: yes  Allergies reviewed: yes  Dietary changes reviewed: yes  Referrals reviewed: yes   Functional Questionnaire:   Activities of Daily Living (ADLs):   She states they are independent in the following: All ADLs States they require assistance with the following: None.   Any transportation issues/concerns?: No   Any patient concerns? No   Confirmed importance and date/time of follow-up visits scheduled Yes  Provider Appointment booked with PCP on 06/28/15.  Confirmed with patient if condition begins to worsen call PCP or go to the ER.  Patient was given the office number and encouraged to call back with question or concerns.  : yes

## 2015-06-27 NOTE — Telephone Encounter (Signed)
Follow up TCM phone call made.  Pt says she is doing okay, but a little weak.  Patient not discharged on 06/25/15 as noted.  Confirmed discharge 06/24/15.  Appointment made for 06/28/15.

## 2015-06-28 ENCOUNTER — Telehealth: Payer: Self-pay

## 2015-06-28 ENCOUNTER — Ambulatory Visit (INDEPENDENT_AMBULATORY_CARE_PROVIDER_SITE_OTHER): Payer: BC Managed Care – PPO | Admitting: Internal Medicine

## 2015-06-28 DIAGNOSIS — Z09 Encounter for follow-up examination after completed treatment for conditions other than malignant neoplasm: Secondary | ICD-10-CM | POA: Insufficient documentation

## 2015-06-28 NOTE — Telephone Encounter (Signed)
Appointment was confirmed with patient yesterday.  Patient said her dialysis was at noon and the appointed time of 1015 was appropriate.  In following up (TCM call) patient said she was doing okay with some fatigue.  Denied vomiting. Patient was encouraged to contact her PCP or ED if condition worsens.

## 2015-06-28 NOTE — Telephone Encounter (Signed)
FYI

## 2015-06-28 NOTE — Telephone Encounter (Signed)
The patient called and cancelled her apt for today due to having go to dialysis

## 2015-06-28 NOTE — Progress Notes (Signed)
patient was called yesterday evening for hospital follow up apt today   Patient cancelled this morning due to dialysis apt being moved up;   no charge

## 2015-07-02 ENCOUNTER — Ambulatory Visit: Payer: BC Managed Care – PPO | Admitting: Internal Medicine

## 2015-07-02 ENCOUNTER — Encounter: Payer: Self-pay | Admitting: Emergency Medicine

## 2015-07-02 ENCOUNTER — Other Ambulatory Visit: Payer: Self-pay

## 2015-07-02 ENCOUNTER — Emergency Department
Admission: EM | Admit: 2015-07-02 | Discharge: 2015-07-02 | Disposition: A | Discharge status: Home / Self Care | Payer: BC Managed Care – PPO | Attending: Emergency Medicine | Admitting: Emergency Medicine

## 2015-07-02 ENCOUNTER — Emergency Department: Payer: BC Managed Care – PPO

## 2015-07-02 DIAGNOSIS — E8770 Fluid overload, unspecified: Secondary | ICD-10-CM | POA: Diagnosis not present

## 2015-07-02 DIAGNOSIS — I129 Hypertensive chronic kidney disease with stage 1 through stage 4 chronic kidney disease, or unspecified chronic kidney disease: Secondary | ICD-10-CM | POA: Diagnosis not present

## 2015-07-02 DIAGNOSIS — Z720 Tobacco use: Secondary | ICD-10-CM | POA: Diagnosis not present

## 2015-07-02 DIAGNOSIS — Z79899 Other long term (current) drug therapy: Secondary | ICD-10-CM | POA: Diagnosis not present

## 2015-07-02 DIAGNOSIS — R0602 Shortness of breath: Secondary | ICD-10-CM | POA: Diagnosis present

## 2015-07-02 DIAGNOSIS — N189 Chronic kidney disease, unspecified: Secondary | ICD-10-CM | POA: Diagnosis not present

## 2015-07-02 LAB — BASIC METABOLIC PANEL
ANION GAP: 12 (ref 5–15)
BUN: 25 mg/dL — AB (ref 6–20)
CHLORIDE: 95 mmol/L — AB (ref 101–111)
CO2: 28 mmol/L (ref 22–32)
Calcium: 7.9 mg/dL — ABNORMAL LOW (ref 8.9–10.3)
Creatinine, Ser: 6.38 mg/dL — ABNORMAL HIGH (ref 0.44–1.00)
GFR calc Af Amer: 8 mL/min — ABNORMAL LOW (ref >60)
GFR calc non Af Amer: 7 mL/min — ABNORMAL LOW (ref >60)
Glucose, Bld: 106 mg/dL — ABNORMAL HIGH (ref 65–99)
Potassium: 4.1 mmol/L (ref 3.5–5.1)
SODIUM: 135 mmol/L (ref 135–145)

## 2015-07-02 LAB — CBC
HCT: 24.8 % — ABNORMAL LOW (ref 35.0–47.0)
HEMOGLOBIN: 8 g/dL — AB (ref 12.0–16.0)
MCH: 28.1 pg (ref 26.0–34.0)
MCHC: 32.2 g/dL (ref 32.0–36.0)
MCV: 87.3 fL (ref 80.0–100.0)
Platelets: 142 10*3/uL — ABNORMAL LOW (ref 150–440)
RBC: 2.84 MIL/uL — ABNORMAL LOW (ref 3.80–5.20)
RDW: 19.1 % — AB (ref 11.5–14.5)
WBC: 5.2 10*3/uL (ref 3.6–11.0)

## 2015-07-02 NOTE — Discharge Instructions (Signed)
You have an appointment for [facility] today at 4 PM [facility] [place]. It is a very important that you go to dialysis today. Dr. Cherylann RatelLateef has made an appointment at 4 PM today.  Return to the emergency room right away should you develop increased shortness of breath, trouble breathing, feel weak, pass out, or other new concerns or symptoms arise.

## 2015-07-02 NOTE — ED Notes (Signed)
Patient to follow-up with Dr. Cherylann RatelLateef today.

## 2015-07-02 NOTE — ED Provider Notes (Signed)
[facility] Emergency Department Provider Note  ____________________________________________  Time seen: Approximately 10:21 AM  I have reviewed the triage vital signs and the nursing notes.   HISTORY  Chief Complaint Shortness of Breath    HPI Dorothy Long is a 54 y.o. female who is on hemodialysis last performed on Saturday. She reports that she has been slowly having swelling in her legs and that she is also beginning to feel slightly short of breath now. This is much worse when she walks. She reports that it is difficult for her to lay flat without feeling sort of breath. She reports that she has had these symptoms many times with retaining fluid. No pain. No cough. No fevers or chills. She reports calling Dr. Cherylann Ratel who advised her to come to the ER as they were unable to arrange for outpatient dialysis session today.     Past Medical History  Diagnosis Date  . Hypertension   . Hyperlipidemia   . COPD (chronic obstructive pulmonary disease)   . Anemia   . Mitral valve stenosis, severe   . CHF (congestive heart failure)   . Chronic kidney disease   . Acute on chronic renal failure   . Diastolic congestive heart failure   . A-fib   . Dialysis patient   . Diabetes mellitus without complication     Patient Active Problem List   Diagnosis Date Noted  . Hospital discharge follow-up 06/28/2015  . Unilateral inguinal hernia without obstruction 06/22/2015  . Diarrhea 06/22/2015  . Fatty liver 06/22/2015  . Nausea & vomiting 06/22/2015  . SBP (spontaneous bacterial peritonitis) 06/22/2015  . Left inguinal hernia 06/22/2015  . Chest pain 06/15/2015  . TMJ (sprain of temporomandibular joint) 05/21/2015  . Postmenopausal atrophic vaginitis 06/19/2014  . Allergic rhinitis 05/29/2014  . Bilateral leg edema 05/15/2014  . Awaiting organ transplant 12/23/2013  . Hyperparathyroidism due to renal insufficiency 12/23/2013  . Excess fluid volume  09/27/2013  . Paroxysmal atrial fibrillation 09/25/2013  . GERD (gastroesophageal reflux disease) 08/12/2013  . Focal and segmental hyalinosis 08/08/2013  . Mitral stenosis with incompetence or regurgitation 07/19/2013  . Anemia in chronic kidney disease 07/19/2013  . End stage kidney disease 07/14/2013  . Tobacco abuse counseling 12/20/2012  . Vitamin D deficiency 07/22/2012  . Hyperlipidemia   . Hypertension 07/21/2012  . Family history of colon cancer 07/21/2012  . History of tobacco abuse 07/21/2012    Past Surgical History  Procedure Laterality Date  . Abdominal hysterectomy    . Cholecystectomy    . Knee surgery Right   . Foot surgery Bilateral   . Av fistula placement      Current Outpatient Rx  Name  Route  Sig  Dispense  Refill  . AURYXIA 210 MG TABS   Oral   Take 3 tablets by mouth 3 (three) times daily.           Dispense as written.   . ondansetron (ZOFRAN) 4 MG tablet   Oral   Take 1 tablet (4 mg total) by mouth every 6 (six) hours as needed for nausea.   20 tablet   0   . potassium chloride (K-DUR,KLOR-CON) 10 MEQ tablet   Oral   Take 2 tablets by mouth daily.         Marland Kitchen RENVELA 800 MG tablet   Oral   Take 1,600 mg by mouth 3 (three) times daily with meals. And one tablet with snacks  Dispense as written.   Marland Kitchen acetaminophen (TYLENOL) 325 MG tablet   Oral   Take 325 mg by mouth every 4 (four) hours as needed for mild pain.          Marland Kitchen albuterol (VENTOLIN HFA) 108 (90 BASE) MCG/ACT inhaler   Inhalation   Inhale 1-2 puffs into the lungs every 6 (six) hours as needed for wheezing or shortness of breath.          . ALPRAZolam (XANAX) 0.25 MG tablet   Oral   Take 1 tablet (0.25 mg total) by mouth 3 (three) times daily as needed for sleep.   30 tablet   0   . Calcium Acetate 667 MG TABS   Oral   Take 3 capsules by mouth 3 (three) times daily.         . cinacalcet (SENSIPAR) 90 MG tablet   Oral   Take 180 mg by mouth daily.  With the largest meal of the day         . cyclobenzaprine (FLEXERIL) 10 MG tablet   Oral   Take 1 tablet (10 mg total) by mouth 3 (three) times daily as needed for muscle spasms.   30 tablet   0   . diphenhydrAMINE (BENADRYL) 25 MG tablet   Oral   Take 25 mg by mouth every other day. MON, WED, FRI BEFORE DIALYSIS         . furosemide (LASIX) 80 MG tablet   Oral   Take 80 mg by mouth daily.         Marland Kitchen HYDROcodone-acetaminophen (NORCO/VICODIN) 5-325 MG per tablet   Oral   Take 1 tablet by mouth every 4 (four) hours as needed for moderate pain.          . metoprolol succinate (TOPROL-XL) 25 MG 24 hr tablet      TAKE 1 TABLET (25 MG TOTAL) BY MOUTH DAILY.   30 tablet   5     Allergies Ciprocinonide; Demerol; Diflucan; Flagyl; Levaquin; Morphine and related; Tetracyclines & related; and Valium  Family History  Problem Relation Age of Onset  . Cancer Mother     colon  . Heart disease Father   . Hypertension Father     Social History History  Substance Use Topics  . Smoking status: Current Some Day Smoker -- 0.25 packs/day for 20 years    Types: Cigarettes  . Smokeless tobacco: Never Used  . Alcohol Use: No     Comment: occasional    Review of Systems Constitutional: No fever/chills Eyes: No visual changes. ENT: No sore throat. Cardiovascular: Denies chest pain. Respiratory: See history of present illness  Gastrointestinal: No abdominal pain for chronic tenderness over her hernia in the left groin which is unchanged.  No nausea, no vomiting.  No diarrhea.  No constipation. Genitourinary: Negative for dysuria. Musculoskeletal: Negative for back pain. Skin: Negative for rash. Neurological: Negative for headaches, focal weakness or numbness.  10-point ROS otherwise negative.  ____________________________________________   PHYSICAL EXAM:  VITAL SIGNS: ED Triage Vitals  Enc Vitals Group     BP 07/02/15 0958 111/90 mmHg     Pulse Rate 07/02/15 0958  91     Resp 07/02/15 0958 20     Temp 07/02/15 0958 98.5 F (36.9 C)     Temp Source 07/02/15 0958 Oral     SpO2 07/02/15 0958 97 %     Weight 07/02/15 0958 119 lb (53.978 kg)     Height 07/02/15  4034 5\' 10"  (1.778 m)     Head Cir --      Peak Flow --      Pain Score 07/02/15 1000 8     Pain Loc --      Pain Edu? --      Excl. in GC? --     Constitutional: Alert and oriented. Well appearing and in no acute distress. Eyes: Conjunctivae are normal. PERRL. EOMI. Head: Atraumatic. Nose: No congestion/rhinnorhea. Mouth/Throat: Mucous membranes are moist.  Oropharynx non-erythematous. Neck: No stridor. Mild JVD.  Cardiovascular: Normal rate, regular rhythm. Grossly normal heart sounds.  Good peripheral circulation. Respiratory: Normal respiratory effort.  No retractions. Lungs CTAB. Gastrointestinal: Soft and nontender. No distention. No abdominal bruits. No CVA tenderness. Musculoskeletal: No lower extremity tenderness and there is 1+ lower extremity edema bilaterally.  No joint effusions. AV fistula in the left upper extremity with normal thrill. Neurologic:  Normal speech and language. No gross focal neurologic deficits are appreciated. No gait instability. Skin:  Skin is warm, dry and intact. No rash noted. Psychiatric: Mood and affect are normal. Speech and behavior are normal.  ____________________________________________   LABS (all labs ordered are listed, but only abnormal results are displayed)  Labs Reviewed  BASIC METABOLIC PANEL - Abnormal; Notable for the following:    Chloride 95 (*)    Glucose, Bld 106 (*)    BUN 25 (*)    Creatinine, Ser 6.38 (*)    Calcium 7.9 (*)    GFR calc non Af Amer 7 (*)    GFR calc Af Amer 8 (*)    All other components within normal limits  CBC - Abnormal; Notable for the following:    RBC 2.84 (*)    Hemoglobin 8.0 (*)    HCT 24.8 (*)    RDW 19.1 (*)    Platelets 142 (*)    All other components within normal limits    ____________________________________________  EKG  Reviewed and interpreted by me EKG time 10:43 AM Low-voltage QRS, this appears to be chronic Slight prolonged QT Normal sinus rhythm no acute ischemic change. No evidence of peak T waves or acute electrolyte abnormality. PR 184 QRS 86 QTc 488. Interpreted as normal sinus rhythm, slight prolonged QT, normal ST segments with low voltage, as compared with previous EKG low-voltage is not new. ____________________________________________  RADIOLOGY  DG Chest 2 View (Final result) Result time: 07/02/15 11:01:57   Final result by Rad Results In Interface (07/02/15 11:01:57)   Narrative:   CLINICAL DATA: Shortness breath beginning 2 days ago, worsening today. Dialysis patient.  EXAM: CHEST 2 VIEW  COMPARISON: 06/14/2015  FINDINGS: The heart is enlarged. Mediastinal shadows are normal. There is pulmonary venous hypertension without frank edema. There are small pleural effusions. No focal lesion.  IMPRESSION: Findings consistent with fluid overload/mild congestive heart failure. Cardiomegaly, venous hypertension and small effusions.    ____________________________________________   PROCEDURES  Procedure(s) performed: None  Critical Care performed: No  ____________________________________________   INITIAL IMPRESSION / ASSESSMENT AND PLAN / ED COURSE  Pertinent labs & imaging results that were available during my care of the patient were reviewed by me and considered in my medical decision making (see chart for details).  Patient presents with increased thing edema and mild dyspnea. By examination she has no increased work of breathing and her lungs are clear. In addition her oxygen level is normal. She does report weight gain and does have peripheral edema which is bilateral and pitting consistent with  mild volume overload. I do not believe that she is in need of emergent dialysis at this time based on  examination and I discussed with Dr. Cherylann RatelLateef who has secured a 4 PM appointment for [facility] [place]. We will check basic labs and a chest x-ray, should these not reveal concerning signs I will agree with Dr. Cherylann RatelLateef that outpatient dialysis as scheduled today seems most appropriate.  ----------------------------------------- 11:21 AM on 07/02/2015 -----------------------------------------  Discussed with Dr. Cherylann RatelLateef the patient's chest x-ray, examination and labs. At this point discussion with case management as well the patient does not meet criteria for inpatient hospitalization or dialysis at this time here at [facility]. Discussed with Dr. Cherylann RatelLateef, he advises the patient is to follow up at 4 PM today for dialysis at [facility] in [place]. I discussed with the patient. I've given her strict return precautions including increased shortness of breath, chest pain, trouble breathing, wheezing or other concerns she will come back to the ER in the interim. Otherwise, patient fully aware of the plan to be discharged and I believe that she is stable to follow up for dialysis today at 4 PM. She does have mild volume overload but is not hypoxic, no increased work of breathing. ____________________________________________   FINAL CLINICAL IMPRESSION(S) / ED DIAGNOSES  Final diagnoses:  Hypervolemia, unspecified hypervolemia type      Sharyn CreamerMark Quale, MD 07/02/15 1127

## 2015-07-02 NOTE — ED Notes (Signed)
Pt to ed with c/o shortness of breath started Saturday and worse today. Pt recvs dialysis and did not have much fluid removed on Saturday due to hypotension. Pt states breathing is worse today. Pt is seen by Dr Cherylann RatelLateef of whom sent pt over for further eval.

## 2015-07-02 NOTE — ED Notes (Signed)
Brought in by family with diff breathing .the patient is a dialysis pt

## 2015-07-05 ENCOUNTER — Encounter: Payer: Self-pay | Admitting: General Surgery

## 2015-07-05 ENCOUNTER — Ambulatory Visit (INDEPENDENT_AMBULATORY_CARE_PROVIDER_SITE_OTHER): Payer: Medicare Other | Admitting: General Surgery

## 2015-07-05 VITALS — BP 100/62 | HR 62 | Resp 14 | Ht 70.0 in | Wt 122.0 lb

## 2015-07-05 DIAGNOSIS — K4090 Unilateral inguinal hernia, without obstruction or gangrene, not specified as recurrent: Secondary | ICD-10-CM | POA: Diagnosis not present

## 2015-07-05 DIAGNOSIS — K439 Ventral hernia without obstruction or gangrene: Secondary | ICD-10-CM | POA: Insufficient documentation

## 2015-07-05 NOTE — Progress Notes (Signed)
Patient ID: Dorothy [name], female DOB: 09/26/1961, 54 y.o. MRN: [id number]  Chief Complaint   Patient presents with   .  Hernia    HPI  Dorothy [name] is a 54 y.o. female. Here today for evaluation of a possible left groin hernia. She states she has had constant left groin pain for about a month. She states that when she would attach to her peritoneal dialysis catheter at night the lower abdomen and left groin would swell.  She was seen in the ED June 30 for pain, July 8 and 18 as well for fluid overload. She had a CT scan on 06-14-15, 06-15-15 and 06-22-15.  She is currently on hemodialysis and goes on Tue Thurs and Sat.  Her peritoneal dialysis is on hold until after her hernia surgery.  HPI  Past Medical History   Diagnosis  Date   .  Hypertension    .  Hyperlipidemia    .  COPD (chronic obstructive pulmonary disease)    .  Anemia    .  Mitral valve stenosis, severe    .  CHF (congestive heart failure)    .  Chronic kidney disease    .  Acute on chronic renal failure    .  Diastolic congestive heart failure    .  A-fib    .  Dialysis patient    .  Diabetes mellitus without complication    .  Peritoneal dialysis status  4- 15-15   .  Murmur     Past Surgical History   Procedure  Laterality  Date   .  Abdominal hysterectomy     .  Cholecystectomy     .  Knee surgery  Right    .  Foot surgery  Bilateral    .  Av fistula placement  Left  10-26-13     Dr Schnier   .  Peritoneal catheter insertion   03-29-14     Dr Schnier    Family History   Problem  Relation  Age of Onset   .  Cancer  Mother      colon   .  Heart disease  Father    .  Hypertension  Father     Social History  History   Substance Use Topics   .  Smoking status:  Current Some Day Smoker -- 0.25 packs/day for 20 years     Types:  Cigarettes   .  Smokeless tobacco:  Never Used   .  Alcohol Use:  No      Comment: occasional    Allergies   Allergen  Reactions   .  Ciprocinonide [Fluocinolone]    .   Demerol [Meperidine]    .  Diflucan [Fluconazole]    .  Flagyl [Metronidazole]    .  Levaquin [Levofloxacin In D5w]  Other (See Comments)     Numbness in legs   .  Morphine And Related  Nausea And Vomiting   .  Tetracyclines & Related    .  Valium [Diazepam]     Current Outpatient Prescriptions   Medication  Sig  Dispense  Refill   .  acetaminophen (TYLENOL) 325 MG tablet  Take 325 mg by mouth every 4 (four) hours as needed for mild pain.     .  albuterol (VENTOLIN HFA) 108 (90 BASE) MCG/ACT inhaler  Inhale 1-2 puffs into the lungs every 6 (six) hours as needed for wheezing or shortness of breath.     .    ALPRAZolam (XANAX) 0.25 MG tablet  Take 1 tablet (0.25 mg total) by mouth 3 (three) times daily as needed for sleep.  30 tablet  0   .  AURYXIA 210 MG TABS  Take 3 tablets by mouth 3 (three) times daily.     .  Calcium Acetate 667 MG TABS  Take 3 capsules by mouth 3 (three) times daily.     .  cinacalcet (SENSIPAR) 90 MG tablet  Take 180 mg by mouth daily. With the largest meal of the day     .  cyclobenzaprine (FLEXERIL) 10 MG tablet  Take 1 tablet (10 mg total) by mouth 3 (three) times daily as needed for muscle spasms.  30 tablet  0   .  diphenhydrAMINE (BENADRYL) 25 MG tablet  Take 25 mg by mouth every other day. MON, WED, FRI BEFORE DIALYSIS     .  furosemide (LASIX) 80 MG tablet  Take 80 mg by mouth daily.     .  HYDROcodone-acetaminophen (NORCO/VICODIN) 5-325 MG per tablet  TAKE 1 TABLET BY MOUTH EVERY 6 HOURS AS NEEDED FOR MODERATE PAIN   0   .  ondansetron (ZOFRAN) 4 MG tablet  Take 1 tablet (4 mg total) by mouth every 6 (six) hours as needed for nausea.  20 tablet  0   .  potassium chloride (K-DUR,KLOR-CON) 10 MEQ tablet  Take 2 tablets by mouth daily.     .  RENVELA 800 MG tablet  Take 1,600 mg by mouth 3 (three) times daily with meals. And one tablet with snacks     .  metoprolol tartrate (LOPRESSOR) 25 MG tablet  Take 25 mg by mouth 2 (two) times daily.      No current  facility-administered medications for this visit.    Review of Systems  Review of Systems  Constitutional: Negative.  HENT: Negative.  Eyes: Negative.  Respiratory: Positive for shortness of breath.  Cardiovascular: Negative.  Gastrointestinal: Positive for abdominal pain and abdominal distention (with peritoneal dialysis fluid infusion).  Endocrine: Positive for cold intolerance. Negative for polyuria.  Genitourinary: Negative.  Allergic/Immunologic: Negative.  Neurological: Negative.  Hematological: Negative.  Psychiatric/Behavioral: Negative.   Blood pressure 100/62, pulse 62, resp. rate 14, height 5' 10" (1.778 m), weight 122 lb (55.339 kg).  Physical Exam  Physical Exam  Constitutional: She is oriented to person, place, and time. She appears well-developed and well-nourished.  HENT:  Mouth/Throat: Oropharynx is clear and moist.  Eyes: Conjunctivae are normal. No scleral icterus.  Neck: Trachea normal. Neck supple. No thyroid mass present.  Cardiovascular: Normal rate and regular rhythm.  Murmur heard.  Systolic murmur is present with a grade of 3/6  Murmur in the left lateral chest wall consistent with mitral valve source.  No aortic valve murmur appreciated.  Pulmonary/Chest: Effort normal and breath sounds normal. No tachypnea.  Abdominal: Soft. Normal appearance. There is tenderness. A hernia is present. Hernia confirmed positive in the left inguinal area.    Egg sized mass left groin non reducible. PD catheter in place. Ventral hernia present.  Lymphadenopathy:  She has no cervical adenopathy.  Right: No inguinal adenopathy present.  Left: No inguinal adenopathy present.  Neurological: She is alert and oriented to person, place, and time. She has normal strength.  Skin: Skin is warm and dry.  Psychiatric: She has a normal mood and affect.   Data Reviewed  No dialysis catheter operative report 2015.  Multiple CT scans of November 2015 as well as 06/14/2015    through 06/22/2015.  Small area in the subcutaneous tissue in the left groin noted in November, more prominent on the contrast (intraperitoneal) study of 06/15/2015 suggesting communicating hydrocele/hernia.  Midline fascial defect just superior to the umbilicus approximately 1 cm in diameter.  ED records and PCP records.  February 2016 hospitalization record (COPD exacerbation with community-acquired pneumonia).  Assessment   Symptomatic left inguinal hernia preventing resumption of peritoneal dialysis.  Symptomatic ventral hernia.  End-stage renal disease.  Significant cardiac dysfunction. Baseline normal exam in March 2016 with Timothy Gollan, M.D.   Plan   The patient is at high risk for surgical intervention, but as she has been doing well on peritoneal dialysis and the CT scan shows no contraindication to continue dialysis once the hernias repaired, it seems reasonable to repair both defects.  The patient received a first generation Cephalosporium for her prophylaxis at the time of the peritoneal dialysis catheter. There is no active evidence of infection. We will have MRSA nasal screening done at preadmission as she is a dialysis patient at present.  Anticipating small defects, will not be planning on using prosthetic mesh for repair. If a more sizable defect is noted we'll likely make use of a bile result will mesh to minimize the risk for infection.  The patient's cardiologist was out of the office this week, but I have made arrangements for her to be evaluated in office on 07/11/2015 with his PA.   Hernia precautions and incarceration were discussed with the patient. If they develop symptoms of an incarcerated hernia, they were encouraged to seek prompt medical attention.  I have recommended repair of the hernia with the possible use of mesh on an outpatient basis in the near future. The risk of infection was reviewed. The role of prosthetic mesh to minimize the risk of recurrence was  reviewed.  Patient is to see Ryan Dunn, P.A. (Dr Gollan) for preop cardiac clearance on 07-11-15 at 1:30 pm.  PCP: Tullo, Teresa L  Byrnett, Jeffrey W  07/05/2015, 12:10 PM  

## 2015-07-05 NOTE — H&P (Signed)
Patient ID: Dorothy [name], female DOB: August 13, 1961, 54 y.o. MRN: [id number]  Chief Complaint   Patient presents with   .  Hernia    HPI  Dorothy [name] is a 54 y.o. female. Here today for evaluation of a possible left groin hernia. She states she has had constant left groin pain for about a month. She states that when she would attach to her peritoneal dialysis catheter at night the [name] abdomen and left groin would swell.  She was seen in the ED June 30 for pain, July 8 and 18 as well for fluid overload. She had a CT scan on 06-14-15, 06-15-15 and 06-22-15.  She is currently on hemodialysis and goes on Tue Thurs and Sat.  Her peritoneal dialysis is on hold until after her hernia surgery.  HPI  Past Medical History   Diagnosis  Date   .  Hypertension    .  Hyperlipidemia    .  COPD (chronic obstructive pulmonary disease)    .  Anemia    .  Mitral valve stenosis, severe    .  CHF (congestive heart failure)    .  Chronic kidney disease    .  Acute on chronic renal failure    .  Diastolic congestive heart failure    .  A-fib    .  Dialysis patient    .  Diabetes mellitus without complication    .  Peritoneal dialysis status  4- 15-15   .  Murmur     Past Surgical History   Procedure  Laterality  Date   .  Abdominal hysterectomy     .  Cholecystectomy     .  Knee surgery  Right    .  Foot surgery  Bilateral    .  Av fistula placement  Left  10-26-13     Dr Gilda Crease   .  Peritoneal catheter insertion   03-29-14     Dr Gilda Crease    Family History   Problem  Relation  Age of Onset   .  Cancer  Mother      colon   .  Heart disease  Father    .  Hypertension  Father     Social History  History   Substance Use Topics   .  Smoking status:  Current Some Day Smoker -- 0.25 packs/day for 20 years     Types:  Cigarettes   .  Smokeless tobacco:  Never Used   .  Alcohol Use:  No      Comment: occasional    Allergies   Allergen  Reactions   .  Ciprocinonide [Fluocinolone]    .   Demerol [Meperidine]    .  Diflucan [Fluconazole]    .  Flagyl [Metronidazole]    .  Levaquin [Levofloxacin In D5w]  Other (See Comments)     Numbness in legs   .  Morphine And Related  Nausea And Vomiting   .  Tetracyclines & Related    .  Valium [Diazepam]     Current Outpatient Prescriptions   Medication  Sig  Dispense  Refill   .  acetaminophen (TYLENOL) 325 MG tablet  Take 325 mg by mouth every 4 (four) hours as needed for mild pain.     Marland Kitchen  albuterol (VENTOLIN HFA) 108 (90 BASE) MCG/ACT inhaler  Inhale 1-2 puffs into the lungs every 6 (six) hours as needed for wheezing or shortness of breath.     Marland Kitchen  ALPRAZolam (XANAX) 0.25 MG tablet  Take 1 tablet (0.25 mg total) by mouth 3 (three) times daily as needed for sleep.  30 tablet  0   .  AURYXIA 210 MG TABS  Take 3 tablets by mouth 3 (three) times daily.     .  Calcium Acetate 667 MG TABS  Take 3 capsules by mouth 3 (three) times daily.     .  cinacalcet (SENSIPAR) 90 MG tablet  Take 180 mg by mouth daily. With the largest meal of the day     .  cyclobenzaprine (FLEXERIL) 10 MG tablet  Take 1 tablet (10 mg total) by mouth 3 (three) times daily as needed for muscle spasms.  30 tablet  0   .  diphenhydrAMINE (BENADRYL) 25 MG tablet  Take 25 mg by mouth every other day. MON, WED, FRI BEFORE DIALYSIS     .  furosemide (LASIX) 80 MG tablet  Take 80 mg by mouth daily.     Marland Kitchen  HYDROcodone-acetaminophen (NORCO/VICODIN) 5-325 MG per tablet  TAKE 1 TABLET BY MOUTH EVERY 6 HOURS AS NEEDED FOR MODERATE PAIN   0   .  ondansetron (ZOFRAN) 4 MG tablet  Take 1 tablet (4 mg total) by mouth every 6 (six) hours as needed for nausea.  20 tablet  0   .  potassium chloride (K-DUR,KLOR-CON) 10 MEQ tablet  Take 2 tablets by mouth daily.     Marland Kitchen  RENVELA 800 MG tablet  Take 1,600 mg by mouth 3 (three) times daily with meals. And one tablet with snacks     .  metoprolol tartrate (LOPRESSOR) 25 MG tablet  Take 25 mg by mouth 2 (two) times daily.      No current  facility-administered medications for this visit.    Review of Systems  Review of Systems  Constitutional: Negative.  HENT: Negative.  Eyes: Negative.  Respiratory: Positive for shortness of breath.  Cardiovascular: Negative.  Gastrointestinal: Positive for abdominal pain and abdominal distention (with peritoneal dialysis fluid infusion).  Endocrine: Positive for cold intolerance. Negative for polyuria.  Genitourinary: Negative.  Allergic/Immunologic: Negative.  Neurological: Negative.  Hematological: Negative.  Psychiatric/Behavioral: Negative.   Blood pressure 100/62, pulse 62, resp. rate 14, height 5\' 10"  (1.778 m), weight 122 lb (55.339 kg).  Physical Exam  Physical Exam  Constitutional: She is oriented to person, place, and time. She appears well-developed and well-nourished.  HENT:  Mouth/Throat: Oropharynx is clear and moist.  Eyes: Conjunctivae are normal. No scleral icterus.  Neck: Trachea normal. Neck supple. No thyroid mass present.  Cardiovascular: Normal rate and regular rhythm.  Murmur heard.  Systolic murmur is present with a grade of 3/6  Murmur in the left lateral chest wall consistent with mitral valve source.  No aortic valve murmur appreciated.  Pulmonary/Chest: Effort normal and breath sounds normal. No tachypnea.  Abdominal: Soft. Normal appearance. There is tenderness. A hernia is present. Hernia confirmed positive in the left inguinal area.    Egg sized mass left groin non reducible. PD catheter in place. Ventral hernia present.  Lymphadenopathy:  She has no cervical adenopathy.  Right: No inguinal adenopathy present.  Left: No inguinal adenopathy present.  Neurological: She is alert and oriented to person, place, and time. She has normal strength.  Skin: Skin is warm and dry.  Psychiatric: She has a normal mood and affect.   Data Reviewed  No dialysis catheter operative report 2015.  Multiple CT scans of November 2015 as well as 06/14/2015  through 06/22/2015.  Small area in the subcutaneous tissue in the left groin noted in November, more prominent on the contrast (intraperitoneal) study of 06/15/2015 suggesting communicating hydrocele/hernia.  Midline fascial defect just superior to the umbilicus approximately 1 cm in diameter.  ED records and PCP records.  February 2016 hospitalization record (COPD exacerbation with community-acquired pneumonia).  Assessment   Symptomatic left inguinal hernia preventing resumption of peritoneal dialysis.  Symptomatic ventral hernia.  End-stage renal disease.  Significant cardiac dysfunction. Baseline normal exam in March 2016 with Dorothy [name], M.D.   Plan   The patient is at high risk for surgical intervention, but as she has been doing well on peritoneal dialysis and the CT scan shows no contraindication to continue dialysis once the hernias repaired, it seems reasonable to repair both defects.  The patient received a first generation Cephalosporium for her prophylaxis at the time of the peritoneal dialysis catheter. There is no active evidence of infection. We will have MRSA nasal screening done at preadmission as she is a dialysis patient at present.  Anticipating small defects, will not be planning on using prosthetic mesh for repair. If a more sizable defect is noted we'll likely make use of a bile result will mesh to minimize the risk for infection.  The patient's cardiologist was out of the office this week, but I have made arrangements for her to be evaluated in office on 07/11/2015 with his PA.   Hernia precautions and incarceration were discussed with the patient. If they develop symptoms of an incarcerated hernia, they were encouraged to seek prompt medical attention.  I have recommended repair of the hernia with the possible use of mesh on an outpatient basis in the near future. The risk of infection was reviewed. The role of prosthetic mesh to minimize the risk of recurrence was  reviewed.  Patient is to see Dorothy [name], P.A. (Dr Mariah Milling) for preop cardiac clearance on 07-11-15 at 1:30 pm.  PCP: Dorothy [name]  07/05/2015, 12:10 PM

## 2015-07-05 NOTE — Patient Instructions (Addendum)

## 2015-07-06 ENCOUNTER — Encounter: Payer: Self-pay | Admitting: Physician Assistant

## 2015-07-06 DIAGNOSIS — I509 Heart failure, unspecified: Secondary | ICD-10-CM | POA: Insufficient documentation

## 2015-07-10 ENCOUNTER — Encounter
Admission: RE | Admit: 2015-07-10 | Discharge: 2015-07-10 | Disposition: A | Discharge status: Home / Self Care | Payer: BC Managed Care – PPO | Source: Ambulatory Visit | Attending: General Surgery | Admitting: General Surgery

## 2015-07-10 DIAGNOSIS — N189 Chronic kidney disease, unspecified: Secondary | ICD-10-CM | POA: Diagnosis not present

## 2015-07-10 DIAGNOSIS — Z992 Dependence on renal dialysis: Secondary | ICD-10-CM | POA: Diagnosis not present

## 2015-07-10 DIAGNOSIS — Z888 Allergy status to other drugs, medicaments and biological substances status: Secondary | ICD-10-CM | POA: Diagnosis not present

## 2015-07-10 DIAGNOSIS — I5030 Unspecified diastolic (congestive) heart failure: Secondary | ICD-10-CM | POA: Diagnosis not present

## 2015-07-10 DIAGNOSIS — I4891 Unspecified atrial fibrillation: Secondary | ICD-10-CM | POA: Diagnosis not present

## 2015-07-10 DIAGNOSIS — Z800 Family history of malignant neoplasm of digestive organs: Secondary | ICD-10-CM | POA: Diagnosis not present

## 2015-07-10 DIAGNOSIS — Z885 Allergy status to narcotic agent status: Secondary | ICD-10-CM | POA: Diagnosis not present

## 2015-07-10 DIAGNOSIS — K4090 Unilateral inguinal hernia, without obstruction or gangrene, not specified as recurrent: Secondary | ICD-10-CM | POA: Diagnosis not present

## 2015-07-10 DIAGNOSIS — R011 Cardiac murmur, unspecified: Secondary | ICD-10-CM | POA: Diagnosis not present

## 2015-07-10 DIAGNOSIS — Z79899 Other long term (current) drug therapy: Secondary | ICD-10-CM | POA: Diagnosis not present

## 2015-07-10 DIAGNOSIS — I130 Hypertensive heart and chronic kidney disease with heart failure and stage 1 through stage 4 chronic kidney disease, or unspecified chronic kidney disease: Secondary | ICD-10-CM | POA: Diagnosis not present

## 2015-07-10 DIAGNOSIS — Z90710 Acquired absence of both cervix and uterus: Secondary | ICD-10-CM | POA: Diagnosis not present

## 2015-07-10 DIAGNOSIS — N179 Acute kidney failure, unspecified: Secondary | ICD-10-CM | POA: Diagnosis not present

## 2015-07-10 DIAGNOSIS — Z881 Allergy status to other antibiotic agents status: Secondary | ICD-10-CM | POA: Diagnosis not present

## 2015-07-10 DIAGNOSIS — Z9049 Acquired absence of other specified parts of digestive tract: Secondary | ICD-10-CM | POA: Diagnosis not present

## 2015-07-10 DIAGNOSIS — E785 Hyperlipidemia, unspecified: Secondary | ICD-10-CM | POA: Diagnosis not present

## 2015-07-10 DIAGNOSIS — Z8249 Family history of ischemic heart disease and other diseases of the circulatory system: Secondary | ICD-10-CM | POA: Diagnosis not present

## 2015-07-10 DIAGNOSIS — F17210 Nicotine dependence, cigarettes, uncomplicated: Secondary | ICD-10-CM | POA: Diagnosis not present

## 2015-07-10 DIAGNOSIS — I10 Essential (primary) hypertension: Secondary | ICD-10-CM | POA: Diagnosis not present

## 2015-07-10 DIAGNOSIS — K439 Ventral hernia without obstruction or gangrene: Secondary | ICD-10-CM | POA: Diagnosis not present

## 2015-07-10 DIAGNOSIS — E1122 Type 2 diabetes mellitus with diabetic chronic kidney disease: Secondary | ICD-10-CM | POA: Diagnosis not present

## 2015-07-10 LAB — SURGICAL PCR SCREEN
MRSA, PCR: NEGATIVE
Staphylococcus aureus: NEGATIVE

## 2015-07-10 LAB — POTASSIUM: Potassium: 3.7 mmol/L (ref 3.5–5.1)

## 2015-07-10 NOTE — Patient Instructions (Signed)
  Your procedure is scheduled on: 07/13/15 Fri Report to Day Surgery. To find out your arrival time please call [phone] between 1PM - 3PM on 07/12/15 Thurs.  Remember: Instructions that are not followed completely may result in serious medical risk, up to and including death, or upon the discretion of your surgeon and anesthesiologist your surgery may need to be rescheduled.    _x___ 1. Do not eat food or drink liquids after midnight. No gum chewing or hard candies.     ____ 2. No Alcohol for 24 hours before or after surgery.   ____ 3. Bring all medications with you on the day of surgery if instructed.    _x___ 4. Notify your doctor if there is any change in your medical condition     (cold, fever, infections).     Do not wear jewelry, make-up, hairpins, clips or nail polish.  Do not wear lotions, powders, or perfumes. You may wear deodorant.  Do not shave 48 hours prior to surgery. Men may shave face and neck.  Do not bring valuables to the hospital.    [facility] is not responsible for any belongings or valuables.               Contacts, dentures or bridgework may not be worn into surgery.  Leave your suitcase in the car. After surgery it may be brought to your room.  For patients admitted to the hospital, discharge time is determined by your                treatment team.   Patients discharged the day of surgery will not be allowed to drive home.   Please read over the following fact sheets that you were given:      __x__ Take these medicines the morning of surgery with A SIP OF WATER:    1. albuterol (VENTOLIN HFA) 108 (90 BASE) MCG/ACT inhaler  2. ALPRAZolam (XANAX) 0.25 MG tablet  3. tiotropium (SPIRIVA) 18 MCG inhalation capsule  4.  5.  6.  ____ Fleet Enema (as directed)   _x___ Use CHG Soap as directed  _x___ Use inhalers on the day of surgery  ____ Stop metformin 2 days prior to surgery    ____ Take 1/2 of usual insulin dose the night before surgery and  none on the morning of surgery.   ____ Stop Coumadin/Plavix/aspirin on   ____ Stop Anti-inflammatories on    ____ Stop supplements until after surgery.    ____ Bring C-Pap to the hospital.

## 2015-07-11 ENCOUNTER — Ambulatory Visit (INDEPENDENT_AMBULATORY_CARE_PROVIDER_SITE_OTHER): Payer: BC Managed Care – PPO | Admitting: Physician Assistant

## 2015-07-11 ENCOUNTER — Encounter: Payer: Self-pay | Admitting: Physician Assistant

## 2015-07-11 VITALS — BP 120/72 | HR 98 | Ht 70.0 in | Wt 122.0 lb

## 2015-07-11 DIAGNOSIS — K439 Ventral hernia without obstruction or gangrene: Secondary | ICD-10-CM | POA: Diagnosis not present

## 2015-07-11 DIAGNOSIS — I5042 Chronic combined systolic (congestive) and diastolic (congestive) heart failure: Secondary | ICD-10-CM

## 2015-07-11 DIAGNOSIS — I10 Essential (primary) hypertension: Secondary | ICD-10-CM

## 2015-07-11 DIAGNOSIS — K4090 Unilateral inguinal hernia, without obstruction or gangrene, not specified as recurrent: Secondary | ICD-10-CM | POA: Diagnosis not present

## 2015-07-11 DIAGNOSIS — I480 Paroxysmal atrial fibrillation: Secondary | ICD-10-CM

## 2015-07-11 DIAGNOSIS — Z716 Tobacco abuse counseling: Secondary | ICD-10-CM

## 2015-07-11 DIAGNOSIS — Z01818 Encounter for other preprocedural examination: Secondary | ICD-10-CM

## 2015-07-11 DIAGNOSIS — N186 End stage renal disease: Secondary | ICD-10-CM

## 2015-07-11 DIAGNOSIS — I052 Rheumatic mitral stenosis with insufficiency: Secondary | ICD-10-CM

## 2015-07-11 NOTE — Patient Instructions (Signed)
Medication Instructions:  Please continue your current medications  Labwork: None  Testing/Procedures: None  Follow-Up: With Dr. Mariah Milling in 6 months  Any Other Special Instructions Will Be Listed Below (If Applicable). You are cleared to proceed w/ surgery You are cleared to drive

## 2015-07-11 NOTE — Progress Notes (Signed)
Cardiology Office Note:  Date of Encounter: 07/11/2015  ID: Dorothy Long, DOB [birth year], MRN [id number]  PCP:  Sherlene Shams, MD Primary Cardiologist:  Dr. Mariah Milling, MD  Chief Complaint  Patient presents with  . other    Needs cardiac clearance hernia repair c/o Fluid retention. Meds reviewd verbally with pt.    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 Lemar Livings, MD.   Her first and only occurence of Afib occured while in the hospital on 09/21/2013 in the setting of acute diarrhea illness. CHADVASc does calulate out to be at least 4 (CHF, HTN, DM, female). Given this was a one-time event she has not been palced on long term, full dose anticoagulation. At that time in hospital echo showed EF 45-50%, diastolic dysfunction, biatrial enalargement, mild aortic regurgitation. Mitral leaflets were thickened and appeared to be rheumatically deformed. There was heavy mitral annular calcification. Mild mitral stenosis with a calcificed valve area of 1.6 cm2 by pressure half-time equation and moderate, eccentric mitral regurgitation. Mild to moderate pulmonary HTN at 35 mm Hg. She underwent pharmacological stress test during this admission that showed no convincing evidence of pharmacologically induced myocardial ischemia. Apparent anterior apical defect that was present only on attenuation corrected images was favored to reflect artifact due to overcorrection and may be worse on stress  imaging secondary to greater patient motion. A small area of ischemia was felt less likely but was difficult to entirely exclude. EF 55%.   Most recent echo performed 05/04/2014 to evaluate her mitral valve showed EF 45-50%, borderline concentric LVH, moderately dilated left atrium, mildly dilated right atrium, small pericardial effusion, moderate mitral regurgitation. There was rheumatic deformity. Moderate thickeing and calficication of the anterior and posterior mitral valves. No gradient was recorded. The mitral stenosis appeared mild to moderate visually. Mild aortic sclerosis without stenosis. Mildly elevated PASP. Mild to moderate tricuspid regurgitation.   She has previously undergone renal biopsy that demonstrated focal segmental glomerulosclerosis at [facility] in the setting of her SCr going from 1.8 to 5 to 6. Ultrasound showed atrophic right kidney. Now ESRD on HD with concomitant PD. She has started classes for kidney transplant through [facility].   She was admitted to [facility] from 6/30 to 7/2 for abdominal/chest pain. CT abdomen pelvis on 7/1 at that time showed small left inguinal hernia. Scan on 6/30 showed enlarged liver and possible cirrhosis, advised to follow up with GI. Troponin was mildly elevated and flat trending at 0.10-->0.11 x 2 in the setting of her ESRD. Lipase was elevated at 79 on 6/30. WBC unremarkable. Cases was dicussed with vascular who felt her PD catheter was ok and she was discharged home. It was felt her elevated troponin was in the setting of her ESRD. In hospital follow up patient was having considerable N/V/D with associated early satiety.   She was admitted to [facility] for a second time from 7/8 to 7/10 for abdominal pain secondary left inguinal hernia and nausea. Initially suspected as spontaneous bacterial peritonitis and treated  with IV Rocephin. Fluid cultures were negative. She responded well to Zofran.   She presented to the ED on 7/18, after last HD session being performed on  7/16, with complaints of lower extremity edema, SOB with exertion, and orthopnea. CXR consistent with fluid overload/mild CHF. Venous HTN and small effusions. Weight 119 pound (53.9 kg) goal dry weight 54.5 kg. Case was discussed with nephrology and she was secured an outpatient dialysis session for later in the day.   She followed up with Dr. Lemar Livings, MD on 7/21 for evaluation of her left inguinal hernia and ventral hernia. At that time she had continued SOB and abdominal pain. She comes in for cardiac clearance of the above surgery.   She is doing well. No angina, increased dyspnea, orthopnea, or PND. Mild intermittent ankle edema associated with dialysis, hot weather, and tennis shoes. She continues to apply salt to her food and smoke daily. She is working on quitting her tobacco abuse, she can smoke "2 packs in a minute." She continues to go to renal transplant classes. Prior to having to change from PD to HD she was not having any issues with any daily activities and looks forward to getting back to baseline.     Past Medical History  Diagnosis Date  . Hypertension   . Hyperlipidemia   . COPD (chronic obstructive pulmonary disease)     a. not on home O2  . Anemia   . Valvular disease     a. 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 echo 2015: EF 45-50%, borderline LVH, mod dilated LA, mildly dilated RA, small pericardial effusion, mod MR (rheumatic deformity), MS mild-mod, no gradient, PASP ele  . Chronic combined systolic and diastolic CHF (congestive heart failure)     a. echo reports above  . PAF (paroxysmal atrial fibrillation)     a. in setting of diarrhea 09/21/2013; b. not on long term full dose anticoagulation; c. CHADSVASC at least 4 (CHF, HTN, DM, female)  . Diabetes mellitus   . Peritoneal dialysis status  4- 15-15  . Ventral hernia   . Inguinal hernia   . History of stress test     a. 2014: no convincing evidence of pharmacologically induced ischemia,  apparent ant apical defect present only on attenuation corrected images favored to reflect artifact due to overcorrection & may be worse on stress imaging secondary to greater patient motion. A small area of ischemia is felt less likely but is difficult to entirely exclude, EF 55%  . ESRD (end stage renal disease) on dialysis     a. HD on T,T,S; b. 2/2 focal segmental glomerulosclerosis   . Dialysis patient   :  Past Surgical History  Procedure Laterality Date  . Abdominal hysterectomy    . Cholecystectomy    . Knee surgery Right   . Foot surgery Bilateral   . Av fistula placement Left 10-26-13    Dr Gilda Crease  . Peritoneal catheter insertion  03-29-14    Dr Gilda Crease  :  Social History:  The patient  reports that she has been smoking Cigarettes.  She has a 12.5 pack-year smoking history. She has never used smokeless tobacco. She reports that she does not drink alcohol or use illicit drugs.   Family History  Problem Relation Age of Onset  . Cancer Mother  colon  . Heart disease Father   . Hypertension Father      Allergies:  Allergies  Allergen Reactions  . Ciprocinonide [Fluocinolone] Hives  . Demerol [Meperidine] Nausea And Vomiting  . Diflucan [Fluconazole] Nausea Only  . Flagyl [Metronidazole] Hives and Itching  . Levaquin [Levofloxacin In D5w] Other (See Comments)    Numbness in legs  . Morphine And Related Nausea And Vomiting  . Tetracyclines & Related Itching and Swelling  . Valium [Diazepam] Nausea Only    hallucinations     Home Medications:  Current Outpatient Prescriptions  Medication Sig Dispense Refill  . acetaminophen (TYLENOL) 325 MG tablet Take 325 mg by mouth every 4 (four) hours as needed for mild pain.     Marland Kitchen albuterol (VENTOLIN HFA) 108 (90 BASE) MCG/ACT inhaler Inhale 1-2 puffs into the lungs  every 6 (six) hours as needed for wheezing or shortness of breath.     . ALPRAZolam (XANAX) 0.25 MG tablet Take 1 tablet (0.25 mg total) by mouth 3 (three) times daily as needed for sleep. 30 tablet 0  . AURYXIA 210 MG TABS Take 3 tablets by mouth 3 (three) times daily.    . Calcium Acetate 667 MG TABS Take 3 capsules by mouth 3 (three) times daily.    . cinacalcet (SENSIPAR) 90 MG tablet Take 180 mg by mouth at bedtime. With the largest meal of the day    . cyclobenzaprine (FLEXERIL) 10 MG tablet Take 1 tablet (10 mg total) by mouth 3 (three) times daily as needed for muscle spasms. 30 tablet 0  . diphenhydrAMINE (BENADRYL) 25 MG tablet Take 25 mg by mouth every other day. MON, WED, FRI BEFORE DIALYSIS    . fluticasone (FLONASE) 50 MCG/ACT nasal spray Place 2 sprays into both nostrils daily.    . furosemide (LASIX) 80 MG tablet Take 40 mg by mouth daily.     Marland Kitchen HYDROcodone-acetaminophen (NORCO/VICODIN) 5-325 MG per tablet TAKE 1 TABLET BY MOUTH EVERY 6 HOURS AS NEEDED FOR MODERATE PAIN  0  . Mag Aspart-Potassium Aspart (POTASSIUM & MAGNESIUM ASPARTAT) 250-250 MG CAPS Take 2 tablets by mouth daily.    . metolazone (ZAROXOLYN) 5 MG tablet Take 5 mg by mouth daily as needed.    . metoprolol tartrate (LOPRESSOR) 25 MG tablet Take 25 mg by mouth 2 (two) times daily.    . ondansetron (ZOFRAN) 4 MG tablet Take 1 tablet (4 mg total) by mouth every 6 (six) hours as needed for nausea. 20 tablet 0  . potassium chloride (K-DUR,KLOR-CON) 10 MEQ tablet Take 2 tablets by mouth daily.    Marland Kitchen RENVELA 800 MG tablet Take 1,600 mg by mouth 3 (three) times daily with meals. And one tablet with snacks    . tiotropium (SPIRIVA) 18 MCG inhalation capsule Place 18 mcg into inhaler and inhale daily.     No current facility-administered medications for this visit.     Review of Systems:  Review of Systems  Constitutional: Negative for fever, chills, weight loss, malaise/fatigue and diaphoresis.  HENT: Negative for  congestion.   Eyes: Negative for blurred vision, discharge and redness.  Respiratory: Negative for cough, hemoptysis, sputum production, shortness of breath and wheezing.   Cardiovascular: Negative for chest pain, palpitations, orthopnea, claudication, leg swelling and PND.  Gastrointestinal: Positive for abdominal pain. Negative for heartburn, nausea and vomiting.       2/2 hernia   Musculoskeletal: Negative for myalgias and falls.  Skin: Negative for rash.  Neurological: Negative for sensory  change, speech change, focal weakness, weakness and headaches.  Psychiatric/Behavioral: The patient is not nervous/anxious.     Physical Exam:  Blood pressure 120/72, pulse 98, height 5\' 10"  (1.778 m), weight 122 lb (55.339 kg). BMI: Body mass index is 17.51 kg/(m^2). General: Pleasant, NAD. Psych: Normal affect. Responds to questions with normal affect.  Neuro: Alert and oriented X 3. Moves all extremities spontaneously. HEENT: Normocephalic, atraumatic. EOM intact. Sclera anicteric.  Neck: Trachea midline. Supple without bruits or JVD. Lungs:  Respirations regular and unlabored. CTA bilaterally without wheezing, crackles, or rhonchi.  Heart: RRR, normal s3, s4. III/VI systolic murmur, I/VI diastolic murmur. No rubs or gallops.  Abdomen: Soft, non-tender, non-distended, BS + x 4.  Extremities: No clubbing, cyanosis or edema. DP/PT/Radials 2+ and equal bilaterally.   Accessory Clinical Findings:  EKG: NSR, 98 bpm, right axis deviation, rare PVC poor R wave, old anterior infarct, nonspecific inferolateral st/t changes   Recent Labs: 06/22/2015: ALT 16 07/02/2015: BUN 25*; Creatinine, Ser 6.38*; Hemoglobin 8.0*; Platelets 142*; Sodium 135 07/10/2015: Potassium 3.7  No results found for requested labs within last 365 days.  Estimated Creatinine Clearance: 9 mL/min (by C-G formula based on Cr of 6.38).  Weights: Wt Readings from Last 3 Encounters:  07/11/15 122 lb (55.339 kg)  07/10/15 115 lb  (52.164 kg)  07/05/15 122 lb (55.339 kg)    Other studies Reviewed: Additional studies/ records that were reviewed today include: prior office notes, [facility] admission, and ED visit.  Assessment & Plan:  1. Cardiac clearance of hernia repair:  -Patient would acceptable risk for non-cardiac surgery at high risk per modified Lee criteria with an estimated rate of MI, PE, Vfib, cardiac arrest, of complete heart block at >11% -No recent episodes of angina, increased dyspnea, or signs of volume over load -Given her asymptomatic presentation would not pursue routine ischemic stress testing at this time as she has actually done quite well outside of her hernias  -She hope to return to PD at a later date s/p repair, this will be up to her surgeon and nephrology    2. Chronic combined systolic and diastolic CHF:  -She is euvolemic today -Would try and limit IV fluid during her surgery  -She reports possible dialysis session planned for Friday (7/29) AM, as well as 1/2 session on 7/30 and 1/2 session on 8/1 -Continue current medications -Limit salt intake  3. Rheumatic mitral valve disease: -Plan for follow up echo at her next OV with Dr. Mariah Milling (once all of the above is complete) -Stable currently  4. PAF: -Isolated episode in the setting of acute diarrhea and has remained in sinus since -Would hold long term full dose anticoagulation given that, especially given her upcoming surgery   5. HTN: -Previously soft during recent admissions, improved today -OK to drive in the pre op setting, will need driver post op  6. ESRD: -On PD previously, now on HD in the setting of hernia -Continues with kidney transplant classes -Per Nephrology   7. Ongoing tobacco abuse: -Cessation advised    Dispo: -Follow up with Dr. Mariah Milling, MD in 6 months  Current medicines are reviewed at length with the patient today.  The patient did not have any concerns regarding medicines.   Eula Listen, PA-C [facility]  [facility] [address] [place], [place] [address] [phone] [facility] 07/11/2015, 3:52 PM

## 2015-07-13 ENCOUNTER — Ambulatory Visit
Admission: RE | Admit: 2015-07-13 | Discharge: 2015-07-13 | Disposition: A | Discharge status: Home / Self Care | Payer: BC Managed Care – PPO | Source: Ambulatory Visit | Attending: General Surgery | Admitting: General Surgery

## 2015-07-13 ENCOUNTER — Ambulatory Visit: Payer: BC Managed Care – PPO | Admitting: *Deleted

## 2015-07-13 ENCOUNTER — Encounter
Admission: RE | Disposition: A | Discharge status: Home / Self Care | Payer: Self-pay | Source: Ambulatory Visit | Attending: General Surgery

## 2015-07-13 DIAGNOSIS — K439 Ventral hernia without obstruction or gangrene: Secondary | ICD-10-CM

## 2015-07-13 DIAGNOSIS — Z800 Family history of malignant neoplasm of digestive organs: Secondary | ICD-10-CM | POA: Insufficient documentation

## 2015-07-13 DIAGNOSIS — Z881 Allergy status to other antibiotic agents status: Secondary | ICD-10-CM | POA: Insufficient documentation

## 2015-07-13 DIAGNOSIS — Z992 Dependence on renal dialysis: Secondary | ICD-10-CM | POA: Insufficient documentation

## 2015-07-13 DIAGNOSIS — I5030 Unspecified diastolic (congestive) heart failure: Secondary | ICD-10-CM | POA: Insufficient documentation

## 2015-07-13 DIAGNOSIS — I10 Essential (primary) hypertension: Secondary | ICD-10-CM | POA: Insufficient documentation

## 2015-07-13 DIAGNOSIS — Z9049 Acquired absence of other specified parts of digestive tract: Secondary | ICD-10-CM | POA: Insufficient documentation

## 2015-07-13 DIAGNOSIS — K4090 Unilateral inguinal hernia, without obstruction or gangrene, not specified as recurrent: Secondary | ICD-10-CM

## 2015-07-13 DIAGNOSIS — Z885 Allergy status to narcotic agent status: Secondary | ICD-10-CM | POA: Insufficient documentation

## 2015-07-13 DIAGNOSIS — Z888 Allergy status to other drugs, medicaments and biological substances status: Secondary | ICD-10-CM | POA: Insufficient documentation

## 2015-07-13 DIAGNOSIS — I4891 Unspecified atrial fibrillation: Secondary | ICD-10-CM | POA: Insufficient documentation

## 2015-07-13 DIAGNOSIS — E1122 Type 2 diabetes mellitus with diabetic chronic kidney disease: Secondary | ICD-10-CM | POA: Insufficient documentation

## 2015-07-13 DIAGNOSIS — Z8249 Family history of ischemic heart disease and other diseases of the circulatory system: Secondary | ICD-10-CM | POA: Insufficient documentation

## 2015-07-13 DIAGNOSIS — N179 Acute kidney failure, unspecified: Secondary | ICD-10-CM | POA: Insufficient documentation

## 2015-07-13 DIAGNOSIS — I130 Hypertensive heart and chronic kidney disease with heart failure and stage 1 through stage 4 chronic kidney disease, or unspecified chronic kidney disease: Secondary | ICD-10-CM | POA: Insufficient documentation

## 2015-07-13 DIAGNOSIS — N189 Chronic kidney disease, unspecified: Secondary | ICD-10-CM | POA: Insufficient documentation

## 2015-07-13 DIAGNOSIS — Z90710 Acquired absence of both cervix and uterus: Secondary | ICD-10-CM | POA: Insufficient documentation

## 2015-07-13 DIAGNOSIS — Z79899 Other long term (current) drug therapy: Secondary | ICD-10-CM | POA: Insufficient documentation

## 2015-07-13 DIAGNOSIS — F17210 Nicotine dependence, cigarettes, uncomplicated: Secondary | ICD-10-CM | POA: Insufficient documentation

## 2015-07-13 DIAGNOSIS — R011 Cardiac murmur, unspecified: Secondary | ICD-10-CM | POA: Insufficient documentation

## 2015-07-13 DIAGNOSIS — E785 Hyperlipidemia, unspecified: Secondary | ICD-10-CM | POA: Insufficient documentation

## 2015-07-13 HISTORY — PX: INGUINAL HERNIA REPAIR: SHX194

## 2015-07-13 HISTORY — PX: VENTRAL HERNIA REPAIR: SHX424

## 2015-07-13 SURGERY — REPAIR, HERNIA, INGUINAL, ADULT
Anesthesia: General | Wound class: Clean

## 2015-07-13 MED ORDER — HYDROCODONE-ACETAMINOPHEN 5-325 MG PO TABS
1.0000 | ORAL_TABLET | ORAL | Status: DC | PRN
  Administered 2015-07-13: 1 via ORAL

## 2015-07-13 MED ORDER — DEXAMETHASONE SODIUM PHOSPHATE 4 MG/ML IJ SOLN
INTRAMUSCULAR | Status: DC | PRN
  Administered 2015-07-13: 5 mg via INTRAVENOUS

## 2015-07-13 MED ORDER — FENTANYL CITRATE (PF) 100 MCG/2ML IJ SOLN
25.0000 ug | INTRAMUSCULAR | Status: DC | PRN
Start: 2015-07-13 — End: 2015-07-13
  Administered 2015-07-13: 50 ug via INTRAVENOUS

## 2015-07-13 MED ORDER — FENTANYL CITRATE (PF) 100 MCG/2ML IJ SOLN
INTRAMUSCULAR | Status: DC | PRN
  Administered 2015-07-13 (×2): 50 ug via INTRAVENOUS

## 2015-07-13 MED ORDER — ONDANSETRON HCL 4 MG/2ML IJ SOLN: 4.0000 mg | Freq: Once | INTRAMUSCULAR | Status: DC | PRN

## 2015-07-13 MED ORDER — HYDROCODONE-ACETAMINOPHEN 5-325 MG PO TABS
ORAL_TABLET | ORAL | Status: AC
  Filled 2015-07-13: qty 1

## 2015-07-13 MED ORDER — GLYCOPYRROLATE 0.2 MG/ML IJ SOLN
INTRAMUSCULAR | Status: DC | PRN
  Administered 2015-07-13: 0.2 mg via INTRAVENOUS

## 2015-07-13 MED ORDER — NEOSTIGMINE METHYLSULFATE 10 MG/10ML IV SOLN
INTRAVENOUS | Status: DC | PRN
  Administered 2015-07-13: 1 mg via INTRAVENOUS

## 2015-07-13 MED ORDER — PROPOFOL 10 MG/ML IV BOLUS
INTRAVENOUS | Status: DC | PRN
  Administered 2015-07-13: 150 mg via INTRAVENOUS

## 2015-07-13 MED ORDER — LIDOCAINE HCL (PF) 1 % IJ SOLN
INTRAMUSCULAR | Status: AC
  Filled 2015-07-13: qty 2

## 2015-07-13 MED ORDER — CEFAZOLIN SODIUM 1-5 GM-% IV SOLN
INTRAVENOUS | Status: AC
  Filled 2015-07-13: qty 50

## 2015-07-13 MED ORDER — ONDANSETRON HCL 4 MG/2ML IJ SOLN
INTRAMUSCULAR | Status: DC | PRN
Start: 2015-07-13 — End: 2015-07-13
  Administered 2015-07-13: 4 mg via INTRAVENOUS

## 2015-07-13 MED ORDER — BUPIVACAINE-EPINEPHRINE (PF) 0.5% -1:200000 IJ SOLN
INTRAMUSCULAR | Status: DC | PRN
Start: 2015-07-13 — End: 2015-07-13
  Administered 2015-07-13: 12 mL via PERINEURAL

## 2015-07-13 MED ORDER — CEFAZOLIN SODIUM 1-5 GM-% IV SOLN
1.0000 g | Freq: Once | INTRAVENOUS | Status: AC
  Administered 2015-07-13: 1 g via INTRAVENOUS

## 2015-07-13 MED ORDER — MIDAZOLAM HCL 2 MG/2ML IJ SOLN
INTRAMUSCULAR | Status: AC
  Administered 2015-07-13: 1 mg
  Filled 2015-07-13: qty 2

## 2015-07-13 MED ORDER — FAMOTIDINE 20 MG PO TABS
ORAL_TABLET | ORAL | Status: AC
  Administered 2015-07-13: 20 mg
  Filled 2015-07-13: qty 1

## 2015-07-13 MED ORDER — KETOROLAC TROMETHAMINE 30 MG/ML IJ SOLN
INTRAMUSCULAR | Status: DC | PRN
  Administered 2015-07-13: 30 mg

## 2015-07-13 MED ORDER — LIDOCAINE HCL (CARDIAC) 20 MG/ML IV SOLN
INTRAVENOUS | Status: DC | PRN
  Administered 2015-07-13: 50 mg via INTRAVENOUS

## 2015-07-13 MED ORDER — SODIUM CHLORIDE 0.9 % IV SOLN
INTRAVENOUS | Status: DC
  Administered 2015-07-13 (×2): via INTRAVENOUS

## 2015-07-13 MED ORDER — BUPIVACAINE-EPINEPHRINE (PF) 0.5% -1:200000 IJ SOLN
INTRAMUSCULAR | Status: AC
  Filled 2015-07-13: qty 30

## 2015-07-13 MED ORDER — SCOPOLAMINE 1 MG/3DAYS TD PT72
MEDICATED_PATCH | TRANSDERMAL | Status: AC
  Administered 2015-07-13: 1.5 mg via TOPICAL
  Filled 2015-07-13: qty 1

## 2015-07-13 MED ORDER — HYDROCODONE-ACETAMINOPHEN 5-325 MG PO TABS
1.0000 | ORAL_TABLET | ORAL | Status: DC | PRN
Start: 2015-07-13 — End: 2015-07-18

## 2015-07-13 MED ORDER — FENTANYL CITRATE (PF) 100 MCG/2ML IJ SOLN
INTRAMUSCULAR | Status: AC
  Filled 2015-07-13: qty 2

## 2015-07-13 SURGICAL SUPPLY — 44 items
BENZOIN TINCTURE PRP APPL 2/3 (GAUZE/BANDAGES/DRESSINGS) ×4 IMPLANT
BLADE SURG 15 STRL SS SAFETY (BLADE) ×8 IMPLANT
CANISTER SUCT 1200ML W/VALVE (MISCELLANEOUS) ×4 IMPLANT
CATH TRAY 16F METER LATEX (MISCELLANEOUS) IMPLANT
CHLORAPREP W/TINT 26ML (MISCELLANEOUS) ×4 IMPLANT
CLOSURE WOUND 1/2 X4 (GAUZE/BANDAGES/DRESSINGS) ×1
DECANTER SPIKE VIAL GLASS SM (MISCELLANEOUS) IMPLANT
DRAIN CHANNEL JP 15F RND 16 (MISCELLANEOUS) IMPLANT
DRAIN PENROSE 1/4X12 LTX (DRAIN) ×4 IMPLANT
DRAPE CHEST BREAST 77X106 FENE (MISCELLANEOUS) IMPLANT
DRAPE INCISE IOBAN 66X45 STRL (DRAPES) ×4 IMPLANT
DRAPE LAPAROTOMY 100X77 ABD (DRAPES) ×4 IMPLANT
DRESSING TELFA 4X3 1S ST N-ADH (GAUZE/BANDAGES/DRESSINGS) IMPLANT
DRSG TEGADERM 4X4.75 (GAUZE/BANDAGES/DRESSINGS) ×4 IMPLANT
DRSG TELFA 3X8 NADH (GAUZE/BANDAGES/DRESSINGS) ×4 IMPLANT
GAUZE SPONGE 4X4 12PLY STRL (GAUZE/BANDAGES/DRESSINGS) IMPLANT
GLOVE BIO SURGEON STRL SZ7.5 (GLOVE) ×12 IMPLANT
GLOVE INDICATOR 8.0 STRL GRN (GLOVE) ×12 IMPLANT
GOWN STRL REUS W/ TWL LRG LVL3 (GOWN DISPOSABLE) ×6 IMPLANT
GOWN STRL REUS W/TWL LRG LVL3 (GOWN DISPOSABLE) ×6
KIT RM TURNOVER STRD PROC AR (KITS) ×4 IMPLANT
LABEL OR SOLS (LABEL) ×4 IMPLANT
NDL SAFETY 22GX1.5 (NEEDLE) ×4 IMPLANT
NDL SAFETY 25GX1.5 (NEEDLE) ×4 IMPLANT
NS IRRIG 500ML POUR BTL (IV SOLUTION) ×4 IMPLANT
PACK BASIN MINOR ARMC (MISCELLANEOUS) ×4 IMPLANT
PAD GROUND ADULT SPLIT (MISCELLANEOUS) ×4 IMPLANT
SPONGE LAP 18X18 5 PK (GAUZE/BANDAGES/DRESSINGS) IMPLANT
STAPLER SKIN PROX 35W (STAPLE) IMPLANT
STRIP CLOSURE SKIN 1/2X4 (GAUZE/BANDAGES/DRESSINGS) ×3 IMPLANT
SUT SURGILON 0 BLK (SUTURE) ×4 IMPLANT
SUT VIC AB 2-0 BRD 54 (SUTURE) IMPLANT
SUT VIC AB 2-0 CT2 27 (SUTURE) ×4 IMPLANT
SUT VIC AB 2-0 SH 27 (SUTURE) ×2
SUT VIC AB 2-0 SH 27XBRD (SUTURE) ×2 IMPLANT
SUT VIC AB 3-0 54X BRD REEL (SUTURE) ×2 IMPLANT
SUT VIC AB 3-0 BRD 54 (SUTURE) ×2
SUT VIC AB 3-0 SH 27 (SUTURE) ×2
SUT VIC AB 3-0 SH 27X BRD (SUTURE) ×2 IMPLANT
SUT VIC AB 4-0 FS2 27 (SUTURE) ×8 IMPLANT
SUT VICRYL+ 3-0 144IN (SUTURE) ×4 IMPLANT
SWABSTK COMLB BENZOIN TINCTURE (MISCELLANEOUS) ×4 IMPLANT
SYR 3ML LL SCALE MARK (SYRINGE) ×4 IMPLANT
SYR CONTROL 10ML (SYRINGE) ×4 IMPLANT

## 2015-07-13 NOTE — Anesthesia Preprocedure Evaluation (Signed)
Anesthesia Evaluation  Patient identified by MRN, date of birth, ID band Patient awake    Reviewed: Allergy & Precautions, NPO status , Patient's Chart, lab work & pertinent test results  History of Anesthesia Complications (+) PONV  Airway Mallampati: II  TM Distance: >3 FB Neck ROM: Full    Dental  (+) Poor Dentition, Missing, Chipped   Pulmonary COPDCurrent Smoker,    + decreased breath sounds      Cardiovascular Exercise Tolerance: Poor hypertension, Pt. on medications and Pt. on home beta blockers +CHF Rhythm:Regular Rate:Normal     Neuro/Psych    GI/Hepatic   Endo/Other  diabetes  Renal/GU ESRF and DialysisRenal disease     Musculoskeletal   Abdominal (+)  Abdomen: soft.    Peds  Hematology   Anesthesia Other Findings   Reproductive/Obstetrics                             Anesthesia Physical Anesthesia Plan  ASA: IV  Anesthesia Plan: General   Post-op Pain Management:    Induction: Intravenous  Airway Management Planned: Oral ETT  Additional Equipment:   Intra-op Plan:   Post-operative Plan: Extubation in OR  Informed Consent: I have reviewed the patients History and Physical, chart, labs and discussed the procedure including the risks, benefits and alternatives for the proposed anesthesia with the patient or authorized representative who has indicated his/her understanding and acceptance.   Dental advisory given  Plan Discussed with: CRNA  Anesthesia Plan Comments:         Anesthesia Quick Evaluation

## 2015-07-13 NOTE — Op Note (Signed)
Preoperative diagnosis: Symptomatic ventral and left inguinal hernia.  Postoperative diagnosis: Same.  Operative procedure: Repair of left indirect inguinal hernia, repair of ventral hernia.  Operating surgeon: Donnalee Curry, M.D.  Anesthesia: Gen. endotracheal, Marcaine 0.5% with 1-200,000 epinephrine, 12 mL local infiltration. Toward all, 30 mg.  Estimated blood loss: Less than 5 mL.  Clinical note: This 54 year old woman is on peritoneal dialysis and developed a bulge in the left groin that was symptomatic. She is felt to have an indirect hernia. She also has a fascial defect at the area just above the umbilicus at the site of an old incision. Plans were for elective repair.  Operative note: With the patient under adequate general endotracheal anesthesia the abdomen was prepped with chlor prep, a gauze placed over the peritoneal dialysis catheter and left upper quadrant and an Ioban drape applied. Attention was turned to the left groin. A 5 cm skin line incision along the anticipated course of the inguinal canal was carried up the skin and subcutaneous venous tissue. It was found that the patient's peritoneal dialysis catheter was entering the fascia just at this point. The simultaneous tissue was elevated and the multilobulated cystic structure that had been palpable prior to surgery was identified. The external oblique was opened in the direction of its fibers. The round ligament was divided and hemostasis achieved with 3-0 Vicryls tie. The sac of the hernia was dissected back to the internal ring. Here was opened. No contents were noted and it was twisted upon itself, suture ligated with an 0 Surgilon suture followed by 0 Surgilon tie. The sac was excised and discarded. Toradol was placed in the wound after closure of the external oblique fascia was closed with interrupted 2-0 Vicryls sutures. Scarpa's fascia was closed with a running 3-0 Vicryls suture. The skin was closed with a running 4-0  Vicryls suture.  Attention was turned to the defect in the midline of the abdomen. A vertical incision was made in the skin incised sharply. A pad of preperitoneal fat was found to be protruding through a less than 1 cm fascial defect. The preperitoneal fat was returned to its normal location and the defect closed with interrupted oh Surgilon sutures placed under direct vision in a transverse direction. The thin layer of adipose tissue was closed with a running 3-0 Vicryls suture. The skin was closed with a running 4-0 Vicryls suture. Benzoin, Steri-Strips, Telfa and Tegaderm dressings were applied to both wounds.  The patient tolerated the procedure well and was taken to recovery room stable condition.

## 2015-07-13 NOTE — H&P (Signed)
No change in clinical exam or history. MRSA screen negative. Symptomatic discomfort at umbilical area and left groin. Plan: Repair ventral and left groin defects.

## 2015-07-13 NOTE — Transfer of Care (Signed)
Immediate Anesthesia Transfer of Care Note  Patient: Dorothy Long  Procedure(s) Performed: Procedure(s): HERNIA REPAIR INGUINAL ADULT (Left) HERNIA REPAIR VENTRAL ADULT (N/A)  Patient Location: PACU  Anesthesia Type:General  Level of Consciousness: Alert, Awake, Oriented  Airway & Oxygen Therapy: Patient Spontanous Breathing  Post-op Assessment: Report given to RN  Post vital signs: Reviewed and stable  Last Vitals:  Filed Vitals:   07/13/15 1117  BP: 135/104  Pulse: 69  Temp: 36.2 C  Resp: 25    Complications: No apparent anesthesia complications

## 2015-07-13 NOTE — Anesthesia Procedure Notes (Signed)
Procedure Name: Intubation Date/Time: 07/13/2015 10:12 AM Performed by: Sherron Flemings Pre-anesthesia Checklist: Patient identified, Patient being monitored, Timeout performed, Emergency Drugs available and Suction available Patient Re-evaluated:Patient Re-evaluated prior to inductionOxygen Delivery Method: Circle system utilized Preoxygenation: Pre-oxygenation with 100% oxygen Intubation Type: IV induction Ventilation: Mask ventilation without difficulty Laryngoscope Size: Mac and 3 Grade View: Grade I Tube type: Oral Tube size: 7.0 mm Number of attempts: 1 Airway Equipment and Method: Stylet Placement Confirmation: ETT inserted through vocal cords under direct vision,  positive ETCO2 and breath sounds checked- equal and bilateral Secured at: 21 cm Tube secured with: Tape Dental Injury: Teeth and Oropharynx as per pre-operative assessment  Difficulty Due To: Difficult Airway- due to anterior larynx

## 2015-07-13 NOTE — Discharge Instructions (Signed)

## 2015-07-13 NOTE — Anesthesia Postprocedure Evaluation (Signed)
  Anesthesia Post-op Note  Patient: Dorothy Long  Procedure(s) Performed: Procedure(s): HERNIA REPAIR INGUINAL ADULT (Left) HERNIA REPAIR VENTRAL ADULT (N/A)  Anesthesia type:General  Patient location: PACU  Post pain: Pain level controlled  Post assessment: Post-op Vital signs reviewed, Patient's Cardiovascular Status Stable, Respiratory Function Stable, Patent Airway and No signs of Nausea or vomiting  Post vital signs: Reviewed and stable  Last Vitals:  Filed Vitals:   07/13/15 1211  BP: 119/86  Pulse:   Temp: 36.1 C  Resp: 18    Level of consciousness: awake, alert  and patient cooperative  Complications: No apparent anesthesia complications

## 2015-07-16 ENCOUNTER — Telehealth: Payer: Self-pay

## 2015-07-16 NOTE — Telephone Encounter (Signed)
Patient states that she has been having some mild itching since yesterday and now she has small bumps forming on her arms. She is concerned that she may be allergic to the Hydrocodone she was prescribed for pain. Patient advised to stop the Hydrocodone and take Benadryl 50 mg now. She states that she already took Benadryl 50 mg about 30 minutes ago just as a precaution.  Dr Lemar Livings notified and prescribed Tramadol 50 mg 1-2 by mouth every 6 hours as needed. This has been called into the patient's pharmacy. Patent is aware and will call back for any more problems.

## 2015-07-18 ENCOUNTER — Ambulatory Visit (INDEPENDENT_AMBULATORY_CARE_PROVIDER_SITE_OTHER): Payer: BC Managed Care – PPO | Admitting: Nurse Practitioner

## 2015-07-18 VITALS — BP 122/70 | HR 96 | Temp 97.9°F | Resp 16 | Ht 70.0 in | Wt 122.8 lb

## 2015-07-18 DIAGNOSIS — L299 Pruritus, unspecified: Secondary | ICD-10-CM | POA: Diagnosis not present

## 2015-07-18 MED ORDER — PREDNISONE 20 MG PO TABS: 20.0000 mg | ORAL_TABLET | Freq: Every day | ORAL | Status: DC

## 2015-07-18 NOTE — Patient Instructions (Signed)
Try 1 tablet of prednisone daily for 5 days.   Call us if not helpful in 2 days.   Take with a meal and if it keeps you awake take in the morning.

## 2015-07-18 NOTE — Progress Notes (Signed)
   Subjective:    Patient ID: Dorothy Long, female    DOB: [birth year], 54 y.o.   MRN: [id number]  HPI  Dorothy Long is a 54 yo female with a CC of rash x 4 days.   1) Pt thinks she had a reaction to the pain medication  Fistula left arm  Dr. Lemar Livings switched Norco to Tramadol thinking this may be helpful, pt reports no difference  Pt has reported since Sunday pruritis all over, eyes, ears, and throat the worst. She reports "bumps" over eyes on top lid.  Benadryl- Not helpful last night Visine Allergy- Not helpful           Calcium Acetate- taking 3 x daily   Pt reports they check phos levels at dialysis  Review of Systems  Constitutional: Negative for fever, chills, diaphoresis and fatigue.  Respiratory: Negative for chest tightness, shortness of breath and wheezing.   Cardiovascular: Negative for chest pain, palpitations and leg swelling.  Gastrointestinal: Negative for nausea, vomiting and diarrhea.  Skin: Negative for rash.       Generalized pruritis  Neurological: Negative for dizziness, weakness, numbness and headaches.  Psychiatric/Behavioral: The patient is not nervous/anxious.       Objective:   Physical Exam  Constitutional: She is oriented to person, place, and time. She appears well-developed and well-nourished. No distress.  BP 122/70 mmHg  Pulse 96  Temp(Src) 97.9 F (36.6 C)  Resp 16  Ht  (1.778 m)  Wt 122 lb 12.8 oz (55.702 kg)  BMI 17.62 kg/m2  SpO2 97%   HENT:  Head: Normocephalic and atraumatic.  Right Ear: External ear normal.  Left Ear: External ear normal.  Eyes: Right eye exhibits no discharge. Left eye exhibits no discharge. No scleral icterus.  Neurological: She is alert and oriented to person, place, and time.  Skin: Skin is warm and dry. No rash noted. She is not diaphoretic. No erythema. No pallor.  No "rash" visualized  Psychiatric: She has a normal mood and affect. Her behavior is normal. JudgmeRomilda Garretcontent normal.           Assessment & Plan:

## 2015-07-18 NOTE — Progress Notes (Signed)
Pre visit review using our clinic review tool, if applicable. No additional management support is needed unless otherwise documented below in the visit note. 

## 2015-07-22 ENCOUNTER — Encounter: Payer: Self-pay | Admitting: Nurse Practitioner

## 2015-07-22 DIAGNOSIS — L299 Pruritus, unspecified: Secondary | ICD-10-CM | POA: Insufficient documentation

## 2015-07-22 NOTE — Assessment & Plan Note (Signed)
Pt on dialysis with recent hernia repair. Will try prednisone 20 mg x 5 days per Dr. Darrick Huntsman recommendation. FU prn worsening/failure to improve.

## 2015-07-24 ENCOUNTER — Encounter: Payer: Self-pay | Admitting: General Surgery

## 2015-07-24 ENCOUNTER — Ambulatory Visit (INDEPENDENT_AMBULATORY_CARE_PROVIDER_SITE_OTHER): Payer: Medicare Other | Admitting: General Surgery

## 2015-07-24 VITALS — BP 120/70 | HR 66 | Resp 12 | Ht 70.0 in | Wt 126.0 lb

## 2015-07-24 DIAGNOSIS — K4090 Unilateral inguinal hernia, without obstruction or gangrene, not specified as recurrent: Secondary | ICD-10-CM

## 2015-07-24 NOTE — Patient Instructions (Addendum)
The patient is aware to call back for any questions or concerns. May resume PD start at 1000 ml and gradually increase amounts to max 1600 ml.

## 2015-07-24 NOTE — Progress Notes (Addendum)
Patient ID: Dorothy Long, female   DOB: 04-Jun-1961, 54 y.o.   MRN: [id number]  Chief Complaint  Patient presents with  . Routine Post Op    hernia    HPI Dorothy Long is a 54 y.o. female.  Here today for her postoperative visit, left inguinal and ventral hernia on 07-13-15. She states she is doing well. She only has a little tenderness in the left groin. She had a reaction to the hydrocodone, itching and was given prednisone from Dr. Darrick Huntsman which cleared it up.  She is currently using HD on T Th and Sat and wishes to return to PD. She is manually draining fluid from her peritoneal catheter 2 pounds on 86, 1/2 pound yesterday.     HPI  Past Medical History  Diagnosis Date  . Hypertension   . Hyperlipidemia   . COPD (chronic obstructive pulmonary disease)     a. not on home O2  . Anemia   . Valvular disease     a. 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 echo 2015: EF 45-50%, borderline LVH, mod dilated LA, mildly dilated RA, small pericardial effusion, mod MR (rheumatic deformity), MS mild-mod, no gradient, PASP ele  . Chronic combined systolic and diastolic CHF (congestive heart failure)     a. echo reports above  . PAF (paroxysmal atrial fibrillation)     a. in setting of diarrhea 09/21/2013; b. not on long term full dose anticoagulation; c. CHADSVASC at least 4 (CHF, HTN, DM, female)  . Diabetes mellitus   . Peritoneal dialysis status 4- 15-15  . Ventral hernia   . Inguinal hernia   . History of stress test     a. 2014: no convincing evidence of pharmacologically induced ischemia,  apparent ant apical defect present only on attenuation corrected images favored to reflect artifact due to overcorrection & may be worse on stress imaging secondary to greater patient motion. A small area of ischemia is  felt less likely but is difficult to entirely exclude, EF 55%  . ESRD (end stage renal disease) on dialysis     a. HD on T,T,S; b. 2/2 focal segmental glomerulosclerosis   . Dialysis patient     Past Surgical History  Procedure Laterality Date  . Abdominal hysterectomy    . Cholecystectomy    . Knee surgery Right   . Foot surgery Bilateral   . Av fistula placement Left 10-26-13    Dr Gilda Crease  . Peritoneal catheter insertion  03-29-14    Dr Gilda Crease  . Inguinal hernia repair Left 07/13/2015    Procedure: HERNIA REPAIR INGUINAL ADULT;  Surgeon: Earline Mayotte, MD;  Location: [facility] ORS;  Service: General;  Laterality: Left;  Marland Kitchen Ventral hernia repair N/A 07/13/2015    Procedure: HERNIA REPAIR VENTRAL ADULT;  Surgeon: Earline Mayotte, MD;  Location: [facility] ORS;  Service: General;  Laterality: N/A;    Family History  Problem Relation Age of Onset  . Cancer Mother     colon  . Heart disease Father   . Hypertension Father     Social History History  Substance Use Topics  . Smoking status: Current Some Day Smoker -- 0.50 packs/day for 25 years    Types: Cigarettes  . Smokeless tobacco: Never Used  . Alcohol Use: No  Comment: occasional    Allergies  Allergen Reactions  . Ciprocinonide [Fluocinolone] Hives  . Demerol [Meperidine] Nausea And Vomiting  . Diflucan [Fluconazole] Nausea Only  . Flagyl [Metronidazole] Hives and Itching  . Hydrocodone Itching  . Levaquin [Levofloxacin In D5w] Other (See Comments)    Numbness in legs  . Morphine And Related Nausea And Vomiting  . Tetracyclines & Related Itching and Swelling  . Valium [Diazepam] Nausea Only    hallucinations    Current Outpatient Prescriptions  Medication Sig Dispense Refill  . acetaminophen (TYLENOL) 325 MG tablet Take 325 mg by mouth every 4 (four) hours as needed for mild pain.     Marland Kitchen albuterol (VENTOLIN HFA) 108 (90 BASE) MCG/ACT inhaler Inhale 1-2 puffs into the lungs every 6 (six) hours as needed for  wheezing or shortness of breath.     . ALPRAZolam (XANAX) 0.25 MG tablet Take 1 tablet (0.25 mg total) by mouth 3 (three) times daily as needed for sleep. 30 tablet 0  . AURYXIA 210 MG TABS Take 3 tablets by mouth 3 (three) times daily.    . Calcium Acetate 667 MG TABS Take 3 capsules by mouth 3 (three) times daily.    . cinacalcet (SENSIPAR) 90 MG tablet Take 180 mg by mouth at bedtime. With the largest meal of the day    . cyclobenzaprine (FLEXERIL) 10 MG tablet Take 1 tablet (10 mg total) by mouth 3 (three) times daily as needed for muscle spasms. 30 tablet 0  . diphenhydrAMINE (BENADRYL) 25 MG tablet Take 25 mg by mouth every other day. MON, WED, FRI BEFORE DIALYSIS    . fluticasone (FLONASE) 50 MCG/ACT nasal spray Place 2 sprays into both nostrils daily.    . furosemide (LASIX) 80 MG tablet Take 40 mg by mouth daily.     . Mag Aspart-Potassium Aspart (POTASSIUM & MAGNESIUM ASPARTAT) 250-250 MG CAPS Take 2 tablets by mouth daily.    . metolazone (ZAROXOLYN) 5 MG tablet Take 5 mg by mouth daily as needed.    . metoprolol tartrate (LOPRESSOR) 25 MG tablet Take 25 mg by mouth 2 (two) times daily.    . ondansetron (ZOFRAN) 4 MG tablet Take 1 tablet (4 mg total) by mouth every 6 (six) hours as needed for nausea. 20 tablet 0  . potassium chloride (K-DUR,KLOR-CON) 10 MEQ tablet Take 2 tablets by mouth daily.    Marland Kitchen RENVELA 800 MG tablet Take 1,600 mg by mouth 3 (three) times daily with meals. And one tablet with snacks    . tiotropium (SPIRIVA) 18 MCG inhalation capsule Place 18 mcg into inhaler and inhale daily.     No current facility-administered medications for this visit.    Review of Systems Review of Systems  Constitutional: Negative.   Respiratory: Negative.   Cardiovascular: Negative.   Gastrointestinal: Negative for nausea, vomiting, diarrhea and constipation.    Blood pressure 120/70, pulse 66, resp. rate 12, height 5\' 10"  (1.778 m), weight 126 lb (57.153 kg).  The patient's weight  is up 4 pounds over pre-op.  Physical Exam Physical Exam  Constitutional: She is oriented to person, place, and time. She appears well-developed and well-nourished.  HENT:  Mouth/Throat: Oropharynx is clear and moist.  Eyes: Conjunctivae are normal. No scleral icterus.  Cardiovascular: Normal rate, regular rhythm and normal heart sounds.   Pulmonary/Chest: Effort normal and breath sounds normal.  Abdominal: Soft. Normal appearance and bowel sounds are normal. There is no tenderness.    Both incisions healing well.  Neurological: She is alert and oriented to person, place, and time.  Skin: Skin is warm and dry.  Psychiatric: Her behavior is normal.       Assessment    Doing well status post repair of left indirect inguinal hernia with hydrocele, ventral hernia repair.    Plan    No mesh was utilized during repair, and the internal ring was closed.    May resume PD tomorrow, start at 1000 ml and increase by 100 mL per day to baseline volume of 1600 mL.   Follow up in 2 weeks, earlier if symptoms arise.   PCP:  Staci Acosta 07/24/2015, 4:16 PM

## 2015-08-08 ENCOUNTER — Ambulatory Visit (INDEPENDENT_AMBULATORY_CARE_PROVIDER_SITE_OTHER): Payer: Medicare Other | Admitting: General Surgery

## 2015-08-08 ENCOUNTER — Encounter: Payer: Self-pay | Admitting: General Surgery

## 2015-08-08 VITALS — BP 120/64 | HR 72 | Resp 12 | Ht 70.0 in | Wt 121.0 lb

## 2015-08-08 DIAGNOSIS — R21 Rash and other nonspecific skin eruption: Secondary | ICD-10-CM | POA: Insufficient documentation

## 2015-08-08 DIAGNOSIS — K439 Ventral hernia without obstruction or gangrene: Secondary | ICD-10-CM

## 2015-08-08 DIAGNOSIS — K4090 Unilateral inguinal hernia, without obstruction or gangrene, not specified as recurrent: Secondary | ICD-10-CM

## 2015-08-08 MED ORDER — HYDROXYZINE HCL 10 MG PO TABS: 10.0000 mg | ORAL_TABLET | Freq: Four times a day (QID) | ORAL | Status: DC | PRN

## 2015-08-08 NOTE — Progress Notes (Signed)
Patient ID: Dorothy Long, female   DOB: [birth year], 54 y.o.   MRN: [id number]  Chief Complaint  Patient presents with  . Routine Post Op    left inguinal and ventral hernia repair    HPI Dorothy Long is a 54 y.o. female here for a follow up from left inguinal and ventral hernia repair done on 07/13/15. She had a recent reaction to an unknown substance while in dialysis 1 week ago. She had a rash that started on her arms that she was told to use Calamine lotion on. She states that has cleared but she now has some itching on her neck and her face. She reports that she is doing well with the hernia repairs and is no longer using pain medication for this. She is doing Peritoneal dialysis at home nightly now.  HPI  Past Medical History  Diagnosis Date  . Hypertension   . Hyperlipidemia   . COPD (chronic obstructive pulmonary disease)     a. not on home O2  . Anemia   . Valvular disease     a. 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 echo 2015: EF 45-50%, borderline LVH, mod dilated LA, mildly dilated RA, small pericardial effusion, mod MR (rheumatic deformity), MS mild-mod, no gradient, PASP ele  . Chronic combined systolic and diastolic CHF (congestive heart failure)     a. echo reports above  . PAF (paroxysmal atrial fibrillation)     a. in setting of diarrhea 09/21/2013; b. not on long term full dose anticoagulation; c. CHADSVASC at least 4 (CHF, HTN, DM, female)  . Diabetes mellitus   . Peritoneal dialysis status 4- 15-15  . Ventral hernia   . Inguinal hernia   . History of stress test     a. 2014: no convincing evidence of pharmacologically induced ischemia,  apparent ant apical defect present only on attenuation corrected images favored to reflect artifact due to overcorrection & may be worse on stress  imaging secondary to greater patient motion. A small area of ischemia is felt less likely but is difficult to entirely exclude, EF 55%  . ESRD (end stage renal disease) on dialysis     a. HD on T,T,S; b. 2/2 focal segmental glomerulosclerosis   . Dialysis patient     Past Surgical History  Procedure Laterality Date  . Abdominal hysterectomy    . Cholecystectomy    . Knee surgery Right   . Foot surgery Bilateral   . Av fistula placement Left 10-26-13    Dr Gilda Crease  . Peritoneal catheter insertion  03-29-14    Dr Gilda Crease  . Inguinal hernia repair Left 07/13/2015    Procedure: HERNIA REPAIR INGUINAL ADULT;  Surgeon: Earline Mayotte, MD;  Location: [facility] ORS;  Service: General;  Laterality: Left;  Marland Kitchen Ventral hernia repair N/A 07/13/2015    Procedure: HERNIA REPAIR VENTRAL ADULT;  Surgeon: Earline Mayotte, MD;  Location: [facility] ORS;  Service: General;  Laterality: N/A;    Family History  Problem Relation Age of Onset  . Cancer Mother     colon  . Heart disease Father   . Hypertension Father     Social History Social History  Substance Use Topics  . Smoking status: Current Some Day Smoker -- 0.50 packs/day for 25 years    Types: Cigarettes  .  Smokeless tobacco: Never Used  . Alcohol Use: No     Comment: occasional    Allergies  Allergen Reactions  . Ciprocinonide [Fluocinolone] Hives  . Demerol [Meperidine] Nausea And Vomiting  . Diflucan [Fluconazole] Nausea Only  . Flagyl [Metronidazole] Hives and Itching  . Hydrocodone Itching  . Levaquin [Levofloxacin In D5w] Other (See Comments)    Numbness in legs  . Morphine And Related Nausea And Vomiting  . Tetracyclines & Related Itching and Swelling  . Valium [Diazepam] Nausea Only    hallucinations    Current Outpatient Prescriptions  Medication Sig Dispense Refill  . acetaminophen (TYLENOL) 325 MG tablet Take 325 mg by mouth every 4 (four) hours as needed for mild pain.     Marland Kitchen albuterol (VENTOLIN HFA) 108 (90 BASE) MCG/ACT  inhaler Inhale 1-2 puffs into the lungs every 6 (six) hours as needed for wheezing or shortness of breath.     . ALPRAZolam (XANAX) 0.25 MG tablet Take 1 tablet (0.25 mg total) by mouth 3 (three) times daily as needed for sleep. 30 tablet 0  . AURYXIA 210 MG TABS Take 3 tablets by mouth 3 (three) times daily.    Marland Kitchen azelastine (OPTIVAR) 0.05 % ophthalmic solution 1 drop at bedtime.    . Calcium Acetate 667 MG TABS Take 3 capsules by mouth 3 (three) times daily.    . cinacalcet (SENSIPAR) 90 MG tablet Take 180 mg by mouth at bedtime. With the largest meal of the day    . cyclobenzaprine (FLEXERIL) 10 MG tablet Take 1 tablet (10 mg total) by mouth 3 (three) times daily as needed for muscle spasms. 30 tablet 0  . diphenhydrAMINE (BENADRYL) 25 MG tablet Take 25 mg by mouth every other day. MON, WED, FRI BEFORE DIALYSIS    . erythromycin ophthalmic ointment 1 application at bedtime as needed.    . fluorometholone (FML) 0.1 % ophthalmic suspension 1 drop at bedtime as needed.    . fluticasone (FLONASE) 50 MCG/ACT nasal spray Place 2 sprays into both nostrils daily.    . furosemide (LASIX) 80 MG tablet Take 40 mg by mouth daily.     . Mag Aspart-Potassium Aspart (POTASSIUM & MAGNESIUM ASPARTAT) 250-250 MG CAPS Take 2 tablets by mouth daily.    . metolazone (ZAROXOLYN) 5 MG tablet Take 5 mg by mouth daily as needed.    . metoprolol tartrate (LOPRESSOR) 25 MG tablet Take 25 mg by mouth 2 (two) times daily.    . ondansetron (ZOFRAN) 4 MG tablet Take 1 tablet (4 mg total) by mouth every 6 (six) hours as needed for nausea. 20 tablet 0  . potassium chloride (K-DUR,KLOR-CON) 10 MEQ tablet Take 2 tablets by mouth daily.    Marland Kitchen RENVELA 800 MG tablet Take 1,600 mg by mouth 3 (three) times daily with meals. And one tablet with snacks    . Skin Protectants, Misc. (EUCERIN) cream Apply topically as needed for dry skin.    Marland Kitchen tiotropium (SPIRIVA) 18 MCG inhalation capsule Place 18 mcg into inhaler and inhale daily.    .  hydrOXYzine (ATARAX/VISTARIL) 10 MG tablet Take 1 tablet (10 mg total) by mouth every 6 (six) hours as needed for itching. 30 tablet 0   No current facility-administered medications for this visit.    Review of Systems Review of Systems  Constitutional: Negative.   Respiratory: Negative.   Cardiovascular: Negative.   Skin: Positive for rash (neck). Negative for color change, pallor and wound.    Blood pressure 120/64,  pulse 72, resp. rate 12, height  (1.778 m), weight 121 lb (54.885 kg).  Physical Exam Physical Exam  Constitutional: She is oriented to person, place, and time. She appears well-developed and well-nourished.  Neck:    Abdominal: Normal appearance.    Left inguinal and ventral surgical sites well healed.   Musculoskeletal:       Arms: Neurological: She is alert and oriented to person, place, and time.  Skin: Skin is warm and dry. Rash noted.  Psychiatric: She has a normal mood and affect.    Data Reviewed PDR.  Assessment    Doing well post hernia repair.      Plan    Dialysis volumes as per renal staff. RX for Vistaril: 10 mg tabs for itching.  The patient was informed that she should not use the Atarax and Benadryl at the same time.     PCP: Staci Acosta 08/08/2015, 8:58 AM

## 2015-08-08 NOTE — Patient Instructions (Addendum)
Follow up as needed. Do not take this new medication and Benadryl together.

## 2015-08-21 ENCOUNTER — Ambulatory Visit
Admission: RE | Admit: 2015-08-21 | Discharge: 2015-08-21 | Disposition: A | Discharge status: Home / Self Care | Payer: BC Managed Care – PPO | Source: Ambulatory Visit | Attending: Internal Medicine | Admitting: Internal Medicine

## 2015-08-21 ENCOUNTER — Telehealth: Payer: Self-pay | Admitting: *Deleted

## 2015-08-21 ENCOUNTER — Other Ambulatory Visit: Payer: Self-pay | Admitting: Internal Medicine

## 2015-08-21 DIAGNOSIS — R059 Cough, unspecified: Secondary | ICD-10-CM

## 2015-08-21 DIAGNOSIS — R05 Cough: Secondary | ICD-10-CM | POA: Diagnosis present

## 2015-08-21 DIAGNOSIS — R918 Other nonspecific abnormal finding of lung field: Secondary | ICD-10-CM | POA: Insufficient documentation

## 2015-08-21 DIAGNOSIS — J449 Chronic obstructive pulmonary disease, unspecified: Secondary | ICD-10-CM | POA: Diagnosis not present

## 2015-08-21 DIAGNOSIS — J90 Pleural effusion, not elsewhere classified: Secondary | ICD-10-CM | POA: Insufficient documentation

## 2015-08-21 DIAGNOSIS — R509 Fever, unspecified: Secondary | ICD-10-CM | POA: Diagnosis present

## 2015-08-21 DIAGNOSIS — R091 Pleurisy: Secondary | ICD-10-CM

## 2015-08-21 NOTE — Telephone Encounter (Signed)
Spoke with pt, she is going to [facility] to have Xray now,  Pt wants to know if she may be seen in the morning by Dr Darrick Huntsman.  pts call back number [phone].  Please advise

## 2015-08-21 NOTE — Telephone Encounter (Signed)
Patient stated that she needs an appointment this afternoon, or tomorrow morning before 10:30am. I just called patient to ask what appointment was needed for. She states that Dr. Cherylann Ratel recommends that she be seen for possibility of pneumonia. Please advise.

## 2015-08-21 NOTE — Telephone Encounter (Signed)
Please tell patient to go to [facility] now for a chest  X ray,  Or to [facility],  And put her down for 4:30 today with me

## 2015-08-21 NOTE — Telephone Encounter (Signed)
Patient has requested to be seen by Dr. Darrick Huntsman  This afternoon or tomorrow morning no later than 10:30am. Please advise a place on the schedule .

## 2015-08-22 ENCOUNTER — Encounter: Payer: Self-pay | Admitting: Nurse Practitioner

## 2015-08-22 ENCOUNTER — Ambulatory Visit (INDEPENDENT_AMBULATORY_CARE_PROVIDER_SITE_OTHER): Payer: BC Managed Care – PPO | Admitting: Nurse Practitioner

## 2015-08-22 VITALS — BP 118/80 | HR 72 | Temp 97.9°F | Resp 16 | Ht 70.0 in | Wt 118.6 lb

## 2015-08-22 DIAGNOSIS — I5042 Chronic combined systolic (congestive) and diastolic (congestive) heart failure: Secondary | ICD-10-CM

## 2015-08-22 DIAGNOSIS — J309 Allergic rhinitis, unspecified: Secondary | ICD-10-CM

## 2015-08-22 MED ORDER — TIOTROPIUM BROMIDE MONOHYDRATE 18 MCG IN CAPS: 18.0000 ug | ORAL_CAPSULE | Freq: Every day | RESPIRATORY_TRACT | Status: AC

## 2015-08-22 MED ORDER — FLUTICASONE PROPIONATE 50 MCG/ACT NA SUSP: 2.0000 | Freq: Every day | NASAL | Status: DC

## 2015-08-22 NOTE — Patient Instructions (Signed)
Continue your flonase and spiriva  Cool wash cloth over eyes for itching.   Continue to get your labs drawn tomorrow and finish the Augmentin.

## 2015-08-22 NOTE — Progress Notes (Signed)
Pre visit review using our clinic review tool, if applicable. No additional management support is needed unless otherwise documented below in the visit note. 

## 2015-08-22 NOTE — Assessment & Plan Note (Signed)
Symptoms of allergic rhinitis appear. Will continue Flonase and Spiriva as needed. Patient is currently taking Augmentin asked her to continue this. Use cool wet cloth for itching eyes. Will follow

## 2015-08-22 NOTE — Progress Notes (Signed)
Patient ID: Dorothy Long, female    DOB: 03/27/1961  Age: 54 y.o. MRN: [id number]  CC: URI   HPI Dorothy Long presents for upper respiratory infection 7 days.  1) Patient had chest x-ray done yesterday. Chest x-ray results: Interval mild increase in the small right pleural effusion. Stable central pulmonary vascular prominence with cardiomegaly consistent with low-grade CHF. There is underlying COPD. No pneumonia is evident.  Dr. Cherylann Ratel- called in Augmentin and she has started on this  Going for labs tomorrow with [facility] dialysis  Going to [facility] until Monday she reports  Takes Lasix daily   Trouble lying flat, nasal congestion, itchy eyes  History Dorothy Long has a past medical history of Hypertension; Hyperlipidemia; COPD (chronic obstructive pulmonary disease); Anemia; Valvular disease; Chronic combined systolic and diastolic CHF (congestive heart failure); PAF (paroxysmal atrial fibrillation); Diabetes mellitus; Peritoneal dialysis status (4- 15-15); Ventral hernia; Inguinal hernia; History of stress test; ESRD (end stage renal disease) on dialysis; and Dialysis patient.   She has past surgical history that includes Abdominal hysterectomy; Cholecystectomy; Knee surgery (Right); Foot surgery (Bilateral); AV fistula placement (Left, 10-26-13); Peritoneal catheter insertion (03-29-14); Inguinal hernia repair (Left, 07/13/2015); and Ventral hernia repair (N/A, 07/13/2015).   Her family history includes Cancer in her mother; Heart disease in her father; Hypertension in her father.She reports that she has been smoking Cigarettes.  She has a 12.5 pack-year smoking history. She has never used smokeless tobacco. She reports that she does not drink alcohol or use illicit drugs.  Outpatient Prescriptions Prior to Visit  Medication Sig Dispense Refill  . acetaminophen (TYLENOL) 325 MG tablet Take 325 mg by mouth every 4 (four) hours as needed for mild pain.     Marland Kitchen albuterol  (VENTOLIN HFA) 108 (90 BASE) MCG/ACT inhaler Inhale 1-2 puffs into the lungs every 6 (six) hours as needed for wheezing or shortness of breath.     . ALPRAZolam (XANAX) 0.25 MG tablet Take 1 tablet (0.25 mg total) by mouth 3 (three) times daily as needed for sleep. 30 tablet 0  . AURYXIA 210 MG TABS Take 3 tablets by mouth 3 (three) times daily.    Marland Kitchen azelastine (OPTIVAR) 0.05 % ophthalmic solution 1 drop at bedtime.    . Calcium Acetate 667 MG TABS Take 3 capsules by mouth 3 (three) times daily.    . cinacalcet (SENSIPAR) 90 MG tablet Take 180 mg by mouth at bedtime. With the largest meal of the day    . cyclobenzaprine (FLEXERIL) 10 MG tablet Take 1 tablet (10 mg total) by mouth 3 (three) times daily as needed for muscle spasms. 30 tablet 0  . diphenhydrAMINE (BENADRYL) 25 MG tablet Take 25 mg by mouth every other day. MON, WED, FRI BEFORE DIALYSIS    . erythromycin ophthalmic ointment 1 application at bedtime as needed.    . fluorometholone (FML) 0.1 % ophthalmic suspension 1 drop at bedtime as needed.    . furosemide (LASIX) 80 MG tablet Take 40 mg by mouth daily.     . hydrOXYzine (ATARAX/VISTARIL) 10 MG tablet Take 1 tablet (10 mg total) by mouth every 6 (six) hours as needed for itching. 30 tablet 0  . Mag Aspart-Potassium Aspart (POTASSIUM & MAGNESIUM ASPARTAT) 250-250 MG CAPS Take 2 tablets by mouth daily.    . metolazone (ZAROXOLYN) 5 MG tablet Take 5 mg by mouth daily as needed.    . metoprolol tartrate (LOPRESSOR) 25 MG tablet Take 25 mg by mouth 2 (two) times daily.    Marland Kitchen  ondansetron (ZOFRAN) 4 MG tablet Take 1 tablet (4 mg total) by mouth every 6 (six) hours as needed for nausea. 20 tablet 0  . potassium chloride (K-DUR,KLOR-CON) 10 MEQ tablet Take 2 tablets by mouth daily.    Marland Kitchen RENVELA 800 MG tablet Take 1,600 mg by mouth 3 (three) times daily with meals. And one tablet with snacks    . Skin Protectants, Misc. (EUCERIN) cream Apply topically as needed for dry skin.    . fluticasone  (FLONASE) 50 MCG/ACT nasal spray Place 2 sprays into both nostrils daily.    Marland Kitchen tiotropium (SPIRIVA) 18 MCG inhalation capsule Place 18 mcg into inhaler and inhale daily.     No facility-administered medications prior to visit.    ROS Review of Systems  Constitutional: Negative for fever, chills, diaphoresis and fatigue.  HENT: Positive for congestion.   Eyes: Positive for itching. Negative for pain, redness and visual disturbance.  Respiratory: Positive for cough. Negative for chest tightness, shortness of breath and wheezing.        Orthopnea  Cardiovascular: Negative for chest pain, palpitations and leg swelling.  Gastrointestinal: Negative for nausea, vomiting and diarrhea.  Genitourinary: Negative for difficulty urinating.  Neurological: Negative for dizziness and headaches.    Objective:  BP 118/80 mmHg  Pulse 72  Temp(Src) 97.9 F (36.6 C)  Resp 16  Ht 5\' 10"  (1.778 m)  Wt 118 lb 9.6 oz (53.797 kg)  BMI 17.02 kg/m2  SpO2 98%  Physical Exam  Constitutional: She is oriented to person, place, and time. She appears well-developed and well-nourished. No distress.  HENT:  Head: Normocephalic and atraumatic.  Right Ear: External ear normal.  Left Ear: External ear normal.  Mouth/Throat: No oropharyngeal exudate.  Cardiovascular: Normal rate, regular rhythm and normal heart sounds.   Pulmonary/Chest: Effort normal and breath sounds normal. No respiratory distress. She has no wheezes. She has no rales. She exhibits no tenderness.  Right lower lobe wheezing and rales  Neurological: She is alert and oriented to person, place, and time. No cranial nerve deficit. She exhibits normal muscle tone. Coordination normal.  Skin: Skin is warm and dry. No rash noted. She is not diaphoretic.  Psychiatric: She has a normal mood and affect. Her behavior is normal. Judgment and thought content normal.   Assessment & Plan:   Dorothy Long was seen today for uri.  Diagnoses and all orders for  this visit:  Chronic combined systolic and diastolic CHF (congestive heart failure)  Allergic rhinitis, unspecified allergic rhinitis type  Other orders -     tiotropium (SPIRIVA) 18 MCG inhalation capsule; Place 1 capsule (18 mcg total) into inhaler and inhale daily. -     fluticasone (FLONASE) 50 MCG/ACT nasal spray; Place 2 sprays into both nostrils daily.   I have changed Dorothy Long's tiotropium. I am also having her maintain her acetaminophen, diphenhydrAMINE, Calcium Acetate, albuterol, cyclobenzaprine, cinacalcet, ALPRAZolam, RENVELA, furosemide, ondansetron, AURYXIA, potassium chloride, metoprolol tartrate, metolazone, Potassium & Magnesium Aspartat, fluorometholone, erythromycin, azelastine, eucerin, hydrOXYzine, amoxicillin-clavulanate, and fluticasone.  Meds ordered this encounter  Medications  . amoxicillin-clavulanate (AUGMENTIN) 500-125 MG per tablet    Sig: Take 1 tablet by mouth every 12 (twelve) hours.  Marland Kitchen tiotropium (SPIRIVA) 18 MCG inhalation capsule    Sig: Place 1 capsule (18 mcg total) into inhaler and inhale daily.    Dispense:  30 capsule    Refill:  0    Order Specific Question:  Supervising Provider    Answer:  Duncan Dull L [[id number]]  .  fluticasone (FLONASE) 50 MCG/ACT nasal spray    Sig: Place 2 sprays into both nostrils daily.    Dispense:  16 g    Refill:  2    Order Specific Question:  Supervising Provider    Answer:  Sherlene Shams [[id number]]     Follow-up: Return if symptoms worsen or fail to improve.

## 2015-08-22 NOTE — Assessment & Plan Note (Signed)
Low-grade CHF noted on chest x-ray. Patient is currently on Lasix. Patient followed by Dr. Mariah Milling. Patient describes stable orthopnea.

## 2015-10-11 ENCOUNTER — Encounter: Payer: Self-pay | Admitting: Medical Oncology

## 2015-10-11 ENCOUNTER — Observation Stay
Admission: EM | Admit: 2015-10-11 | Discharge: 2015-10-12 | Disposition: A | Discharge status: Home / Self Care | Payer: BC Managed Care – PPO | Attending: Internal Medicine | Admitting: Internal Medicine

## 2015-10-11 DIAGNOSIS — Z881 Allergy status to other antibiotic agents status: Secondary | ICD-10-CM | POA: Insufficient documentation

## 2015-10-11 DIAGNOSIS — Z885 Allergy status to narcotic agent status: Secondary | ICD-10-CM | POA: Diagnosis not present

## 2015-10-11 DIAGNOSIS — R109 Unspecified abdominal pain: Secondary | ICD-10-CM | POA: Diagnosis present

## 2015-10-11 DIAGNOSIS — K529 Noninfective gastroenteritis and colitis, unspecified: Secondary | ICD-10-CM | POA: Diagnosis present

## 2015-10-11 DIAGNOSIS — R079 Chest pain, unspecified: Secondary | ICD-10-CM | POA: Insufficient documentation

## 2015-10-11 DIAGNOSIS — E559 Vitamin D deficiency, unspecified: Secondary | ICD-10-CM | POA: Insufficient documentation

## 2015-10-11 DIAGNOSIS — I480 Paroxysmal atrial fibrillation: Secondary | ICD-10-CM | POA: Diagnosis present

## 2015-10-11 DIAGNOSIS — Z23 Encounter for immunization: Secondary | ICD-10-CM | POA: Insufficient documentation

## 2015-10-11 DIAGNOSIS — Z79899 Other long term (current) drug therapy: Secondary | ICD-10-CM | POA: Insufficient documentation

## 2015-10-11 DIAGNOSIS — N186 End stage renal disease: Secondary | ICD-10-CM | POA: Diagnosis present

## 2015-10-11 DIAGNOSIS — Z90710 Acquired absence of both cervix and uterus: Secondary | ICD-10-CM | POA: Diagnosis not present

## 2015-10-11 DIAGNOSIS — K760 Fatty (change of) liver, not elsewhere classified: Secondary | ICD-10-CM | POA: Insufficient documentation

## 2015-10-11 DIAGNOSIS — N952 Postmenopausal atrophic vaginitis: Secondary | ICD-10-CM | POA: Diagnosis not present

## 2015-10-11 DIAGNOSIS — I132 Hypertensive heart and chronic kidney disease with heart failure and with stage 5 chronic kidney disease, or end stage renal disease: Secondary | ICD-10-CM | POA: Diagnosis not present

## 2015-10-11 DIAGNOSIS — E1122 Type 2 diabetes mellitus with diabetic chronic kidney disease: Secondary | ICD-10-CM | POA: Diagnosis not present

## 2015-10-11 DIAGNOSIS — K219 Gastro-esophageal reflux disease without esophagitis: Secondary | ICD-10-CM | POA: Diagnosis not present

## 2015-10-11 DIAGNOSIS — R112 Nausea with vomiting, unspecified: Secondary | ICD-10-CM | POA: Diagnosis present

## 2015-10-11 DIAGNOSIS — E785 Hyperlipidemia, unspecified: Secondary | ICD-10-CM | POA: Insufficient documentation

## 2015-10-11 DIAGNOSIS — D631 Anemia in chronic kidney disease: Secondary | ICD-10-CM | POA: Insufficient documentation

## 2015-10-11 DIAGNOSIS — Z9049 Acquired absence of other specified parts of digestive tract: Secondary | ICD-10-CM | POA: Diagnosis not present

## 2015-10-11 DIAGNOSIS — I4581 Long QT syndrome: Secondary | ICD-10-CM | POA: Insufficient documentation

## 2015-10-11 DIAGNOSIS — Z800 Family history of malignant neoplasm of digestive organs: Secondary | ICD-10-CM | POA: Diagnosis not present

## 2015-10-11 DIAGNOSIS — I5042 Chronic combined systolic (congestive) and diastolic (congestive) heart failure: Secondary | ICD-10-CM | POA: Insufficient documentation

## 2015-10-11 DIAGNOSIS — I4510 Unspecified right bundle-branch block: Secondary | ICD-10-CM | POA: Diagnosis not present

## 2015-10-11 DIAGNOSIS — I052 Rheumatic mitral stenosis with insufficiency: Secondary | ICD-10-CM | POA: Diagnosis not present

## 2015-10-11 DIAGNOSIS — Z888 Allergy status to other drugs, medicaments and biological substances status: Secondary | ICD-10-CM | POA: Diagnosis not present

## 2015-10-11 DIAGNOSIS — K652 Spontaneous bacterial peritonitis: Secondary | ICD-10-CM | POA: Diagnosis not present

## 2015-10-11 DIAGNOSIS — J309 Allergic rhinitis, unspecified: Secondary | ICD-10-CM | POA: Diagnosis not present

## 2015-10-11 DIAGNOSIS — E860 Dehydration: Secondary | ICD-10-CM | POA: Diagnosis present

## 2015-10-11 DIAGNOSIS — J449 Chronic obstructive pulmonary disease, unspecified: Secondary | ICD-10-CM | POA: Insufficient documentation

## 2015-10-11 DIAGNOSIS — Z7951 Long term (current) use of inhaled steroids: Secondary | ICD-10-CM | POA: Insufficient documentation

## 2015-10-11 DIAGNOSIS — Z992 Dependence on renal dialysis: Secondary | ICD-10-CM | POA: Diagnosis not present

## 2015-10-11 DIAGNOSIS — I509 Heart failure, unspecified: Secondary | ICD-10-CM | POA: Diagnosis present

## 2015-10-11 DIAGNOSIS — Z8249 Family history of ischemic heart disease and other diseases of the circulatory system: Secondary | ICD-10-CM | POA: Insufficient documentation

## 2015-10-11 DIAGNOSIS — R21 Rash and other nonspecific skin eruption: Secondary | ICD-10-CM | POA: Diagnosis not present

## 2015-10-11 DIAGNOSIS — I10 Essential (primary) hypertension: Secondary | ICD-10-CM | POA: Diagnosis present

## 2015-10-11 DIAGNOSIS — L299 Pruritus, unspecified: Secondary | ICD-10-CM | POA: Diagnosis not present

## 2015-10-11 LAB — CBC
HEMATOCRIT: 29.5 % — AB (ref 35.0–47.0)
Hemoglobin: 9.8 g/dL — ABNORMAL LOW (ref 12.0–16.0)
MCH: 27.9 pg (ref 26.0–34.0)
MCHC: 33.2 g/dL (ref 32.0–36.0)
MCV: 84.2 fL (ref 80.0–100.0)
Platelets: 145 10*3/uL — ABNORMAL LOW (ref 150–440)
RBC: 3.51 MIL/uL — AB (ref 3.80–5.20)
RDW: 17.3 % — ABNORMAL HIGH (ref 11.5–14.5)
WBC: 5.5 10*3/uL (ref 3.6–11.0)

## 2015-10-11 LAB — COMPREHENSIVE METABOLIC PANEL
ALT: 15 U/L (ref 14–54)
AST: 21 U/L (ref 15–41)
Albumin: 2.8 g/dL — ABNORMAL LOW (ref 3.5–5.0)
Alkaline Phosphatase: 106 U/L (ref 38–126)
Anion gap: 17 — ABNORMAL HIGH (ref 5–15)
BUN: 67 mg/dL — AB (ref 6–20)
CHLORIDE: 96 mmol/L — AB (ref 101–111)
CO2: 23 mmol/L (ref 22–32)
Calcium: 7.4 mg/dL — ABNORMAL LOW (ref 8.9–10.3)
Creatinine, Ser: 9.82 mg/dL — ABNORMAL HIGH (ref 0.44–1.00)
GFR calc non Af Amer: 4 mL/min — ABNORMAL LOW (ref >60)
GFR, EST AFRICAN AMERICAN: 5 mL/min — AB (ref >60)
Glucose, Bld: 91 mg/dL (ref 65–99)
Potassium: 3.6 mmol/L (ref 3.5–5.1)
SODIUM: 136 mmol/L (ref 135–145)
Total Bilirubin: 0.5 mg/dL (ref 0.3–1.2)
Total Protein: 6.2 g/dL — ABNORMAL LOW (ref 6.5–8.1)

## 2015-10-11 LAB — PHOSPHORUS: PHOSPHORUS: 10.3 mg/dL — AB (ref 2.5–4.6)

## 2015-10-11 LAB — MAGNESIUM: Magnesium: 1.9 mg/dL (ref 1.7–2.4)

## 2015-10-11 LAB — LIPASE, BLOOD: LIPASE: 53 U/L — AB (ref 11–51)

## 2015-10-11 MED ORDER — ALPRAZOLAM 0.25 MG PO TABS: 0.2500 mg | ORAL_TABLET | Freq: Three times a day (TID) | ORAL | Status: DC | PRN

## 2015-10-11 MED ORDER — PANTOPRAZOLE SODIUM 40 MG PO TBEC
40.0000 mg | DELAYED_RELEASE_TABLET | Freq: Every day | ORAL | Status: DC
  Administered 2015-10-12: 40 mg via ORAL
  Filled 2015-10-11: qty 1

## 2015-10-11 MED ORDER — CALCIUM ACETATE (PHOS BINDER) 667 MG PO CAPS: 667.0000 mg | ORAL_CAPSULE | Freq: Every day | ORAL | Status: DC

## 2015-10-11 MED ORDER — ALBUTEROL SULFATE (2.5 MG/3ML) 0.083% IN NEBU: 2.5000 mg | INHALATION_SOLUTION | Freq: Four times a day (QID) | RESPIRATORY_TRACT | Status: DC | PRN

## 2015-10-11 MED ORDER — CALCIUM ACETATE (PHOS BINDER) 667 MG PO CAPS
2001.0000 mg | ORAL_CAPSULE | Freq: Three times a day (TID) | ORAL | Status: DC
  Administered 2015-10-12 (×2): 2001 mg via ORAL
  Filled 2015-10-11 (×2): qty 3

## 2015-10-11 MED ORDER — ONDANSETRON HCL 4 MG/2ML IJ SOLN
INTRAMUSCULAR | Status: AC
  Filled 2015-10-11: qty 2

## 2015-10-11 MED ORDER — ACETAMINOPHEN 500 MG PO TABS
1000.0000 mg | ORAL_TABLET | ORAL | Status: AC
  Administered 2015-10-11: 1000 mg via ORAL

## 2015-10-11 MED ORDER — HEPARIN SODIUM (PORCINE) 5000 UNIT/ML IJ SOLN
5000.0000 [IU] | Freq: Three times a day (TID) | INTRAMUSCULAR | Status: DC
  Administered 2015-10-12: 5000 [IU] via SUBCUTANEOUS
  Filled 2015-10-11: qty 1

## 2015-10-11 MED ORDER — ACETAMINOPHEN 325 MG PO TABS
650.0000 mg | ORAL_TABLET | Freq: Four times a day (QID) | ORAL | Status: DC | PRN
  Filled 2015-10-11: qty 2

## 2015-10-11 MED ORDER — PNEUMOCOCCAL VAC POLYVALENT 25 MCG/0.5ML IJ INJ
0.5000 mL | INJECTION | INTRAMUSCULAR | Status: AC
  Administered 2015-10-12: 0.5 mL via INTRAMUSCULAR
  Filled 2015-10-11: qty 0.5

## 2015-10-11 MED ORDER — ACETAMINOPHEN 500 MG PO TABS
ORAL_TABLET | ORAL | Status: AC
  Filled 2015-10-11: qty 2

## 2015-10-11 MED ORDER — METOPROLOL SUCCINATE ER 25 MG PO TB24
25.0000 mg | ORAL_TABLET | Freq: Every day | ORAL | Status: DC
  Administered 2015-10-12: 25 mg via ORAL
  Filled 2015-10-11: qty 1

## 2015-10-11 MED ORDER — SODIUM CHLORIDE 0.9 % IV SOLN
INTRAVENOUS | Status: DC
  Administered 2015-10-11: 22:00:00 via INTRAVENOUS

## 2015-10-11 MED ORDER — SEVELAMER CARBONATE 800 MG PO TABS
1600.0000 mg | ORAL_TABLET | Freq: Three times a day (TID) | ORAL | Status: DC
  Administered 2015-10-12 (×2): 1600 mg via ORAL
  Filled 2015-10-11 (×2): qty 2

## 2015-10-11 MED ORDER — CALCIUM ACETATE 667 MG PO TABS: 3.0000 | ORAL_TABLET | Freq: Three times a day (TID) | ORAL | Status: DC

## 2015-10-11 MED ORDER — ACETAMINOPHEN 650 MG RE SUPP: 650.0000 mg | Freq: Four times a day (QID) | RECTAL | Status: DC | PRN

## 2015-10-11 MED ORDER — TIOTROPIUM BROMIDE MONOHYDRATE 18 MCG IN CAPS
18.0000 ug | ORAL_CAPSULE | Freq: Every day | RESPIRATORY_TRACT | Status: DC
  Administered 2015-10-12: 18 ug via RESPIRATORY_TRACT
  Filled 2015-10-11: qty 5

## 2015-10-11 MED ORDER — ONDANSETRON HCL 4 MG/2ML IJ SOLN: 4.0000 mg | Freq: Four times a day (QID) | INTRAMUSCULAR | Status: DC | PRN

## 2015-10-11 MED ORDER — HYDROXYZINE HCL 10 MG PO TABS
10.0000 mg | ORAL_TABLET | Freq: Four times a day (QID) | ORAL | Status: DC | PRN
  Filled 2015-10-11: qty 1

## 2015-10-11 MED ORDER — MORPHINE SULFATE (PF) 2 MG/ML IV SOLN
1.0000 mg | INTRAVENOUS | Status: DC | PRN
  Administered 2015-10-11 – 2015-10-12 (×2): 1 mg via INTRAVENOUS
  Filled 2015-10-11 (×2): qty 1

## 2015-10-11 MED ORDER — ONDANSETRON HCL 4 MG/2ML IJ SOLN
4.0000 mg | Freq: Once | INTRAMUSCULAR | Status: AC
  Administered 2015-10-11: 4 mg via INTRAVENOUS

## 2015-10-11 MED ORDER — SODIUM CHLORIDE 0.9 % IV BOLUS (SEPSIS)
1000.0000 mL | Freq: Once | INTRAVENOUS | Status: AC
  Administered 2015-10-11: 1000 mL via INTRAVENOUS

## 2015-10-11 NOTE — ED Notes (Signed)
Pt reports diarrhea x 2 days, nausea, and lower abd, and mid back pain.  Pt reports PD for renal failure.  Pt reports loss of appetite as well.  Pt reports feeling dehydrated.  Pt NAD at this time, respirations equal and unlabored, skin warm and dry

## 2015-10-11 NOTE — H&P (Signed)
[facility] - [place] at [facility]   PATIENT NAME: Dorothy Long    MR#:  [id number]  DATE OF BIRTH:  05/03/1961  DATE OF ADMISSION:  10/11/2015  PRIMARY CARE PHYSICIAN: Sherlene Shams, MD   REQUESTING/REFERRING PHYSICIAN: Fanny Bien, M.D.  CHIEF COMPLAINT:   Chief Complaint  Patient presents with  . Diarrhea  . Abdominal Pain    HISTORY OF PRESENT ILLNESS:  Dorothy Long  is a 54 y.o. female who presents with 1 day of abdominal pain with nausea, vomiting, and diarrhea. Patient states she's begun to feel significantly dehydrated. She is an end-stage renal disease patient on peritoneal dialysis. She sees Dr. Cherylann Ratel with nephrology. She called him prior to coming to the ED, and he recommended that she come in to be evaluated. Lab workup in the ED is relatively stable, but the patient is clinically dehydrated. She also still suffering from significant nausea. ED physician communicated with Dr. Cherylann Ratel, who recommended that she be admitted for observation and to consult him so that he can see her in the morning. Hospitalists were called for admission.  PAST MEDICAL HISTORY:   Past Medical History  Diagnosis Date  . Hypertension   . Hyperlipidemia   . COPD (chronic obstructive pulmonary disease) (HCC)     a. not on home O2  . Anemia   . Valvular disease     a. 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 echo 2015: EF 45-50%, borderline LVH, mod dilated LA, mildly dilated RA, small pericardial effusion, mod MR (rheumatic deformity), MS mild-mod, no gradient, PASP ele  . Chronic combined systolic and diastolic CHF (congestive heart failure) (HCC)     a. echo reports above  . PAF (paroxysmal atrial fibrillation) (HCC)     a. in setting of diarrhea 09/21/2013; b. not on long term full dose  anticoagulation; c. CHADSVASC at least 4 (CHF, HTN, DM, female)  . Diabetes mellitus (HCC)   . Peritoneal dialysis status (HCC) 4- 15-15  . Ventral hernia   . Inguinal hernia   . History of stress test     a. 2014: no convincing evidence of pharmacologically induced ischemia,  apparent ant apical defect present only on attenuation corrected images favored to reflect artifact due to overcorrection & may be worse on stress imaging secondary to greater patient motion. A small area of ischemia is felt less likely but is difficult to entirely exclude, EF 55%  . ESRD (end stage renal disease) on dialysis (HCC)     a. HD on T,T,S; b. 2/2 focal segmental glomerulosclerosis   . Dialysis patient [facility])     PAST SURGICAL HISTORY:   Past Surgical History  Procedure Laterality Date  . Abdominal hysterectomy    . Cholecystectomy    . Knee surgery Right   . Foot surgery Bilateral   . Av fistula placement Left 10-26-13    Dr Gilda Crease  . Peritoneal catheter insertion  03-29-14    Dr Gilda Crease  . Inguinal hernia repair Left 07/13/2015    Procedure: HERNIA REPAIR INGUINAL ADULT;  Surgeon: Earline Mayotte, MD;  Location: [facility] ORS;  Service: General;  Laterality: Left;  Marland Kitchen Ventral hernia repair N/A 07/13/2015    Procedure: HERNIA REPAIR VENTRAL ADULT;  Surgeon: Earline Mayotte, MD;  Location: [facility] ORS;  Service: General;  Laterality:  N/A;    SOCIAL HISTORY:   Social History  Substance Use Topics  . Smoking status: Current Some Day Smoker -- 0.50 packs/day for 25 years    Types: Cigarettes  . Smokeless tobacco: Never Used  . Alcohol Use: No     Comment: occasional    FAMILY HISTORY:   Family History  Problem Relation Age of Onset  . Cancer Mother     colon  . Heart disease Father   . Hypertension Father     DRUG ALLERGIES:   Allergies  Allergen Reactions  . Ciprocinonide [Fluocinolone] Hives  . Demerol [Meperidine] Nausea And Vomiting  . Diflucan [Fluconazole] Nausea Only  . Flagyl  [Metronidazole] Hives and Itching  . Hydrocodone Itching  . Levaquin [Levofloxacin In D5w] Other (See Comments)    Numbness in legs  . Morphine And Related Nausea And Vomiting  . Tetracyclines & Related Itching and Swelling  . Valium [Diazepam] Nausea Only    hallucinations    MEDICATIONS AT HOME:   Prior to Admission medications   Medication Sig Start Date End Date Taking? Authorizing Provider  acetaminophen (TYLENOL) 325 MG tablet Take 325 mg by mouth every 4 (four) hours as needed for mild pain.    Yes Historical Provider, MD  albuterol (VENTOLIN HFA) 108 (90 BASE) MCG/ACT inhaler Inhale 1-2 puffs into the lungs every 6 (six) hours as needed for wheezing or shortness of breath.    Yes Historical Provider, MD  ALPRAZolam (XANAX) 0.25 MG tablet Take 1 tablet (0.25 mg total) by mouth 3 (three) times daily as needed for sleep. 06/16/15  Yes Houston Siren, MD  AURYXIA 210 MG TABS Take 3 tablets by mouth 3 (three) times daily.   Yes Historical Provider, MD  Calcium Acetate 667 MG TABS Take 3 capsules by mouth 3 (three) times daily.   Yes Historical Provider, MD  cinacalcet (SENSIPAR) 90 MG tablet Take 90 mg by mouth at bedtime.    Yes Historical Provider, MD  cyclobenzaprine (FLEXERIL) 10 MG tablet Take 1 tablet (10 mg total) by mouth 3 (three) times daily as needed for muscle spasms. 05/21/15  Yes Sherlene Shams, MD  diphenhydrAMINE (BENADRYL) 25 MG tablet Take 25 mg by mouth every other day. MON, WED, FRI BEFORE DIALYSIS   Yes Historical Provider, MD  fluticasone (FLONASE) 50 MCG/ACT nasal spray Place 2 sprays into both nostrils daily. 08/22/15  Yes Carollee Leitz, NP  furosemide (LASIX) 40 MG tablet Take 40-80 mg by mouth daily.   Yes Historical Provider, MD  hydrOXYzine (ATARAX/VISTARIL) 10 MG tablet Take 1 tablet (10 mg total) by mouth every 6 (six) hours as needed for itching. 08/08/15  Yes Earline Mayotte, MD  metoprolol succinate (TOPROL-XL) 25 MG 24 hr tablet Take 25 mg by mouth daily.    Yes Historical Provider, MD  ondansetron (ZOFRAN) 4 MG tablet Take 1 tablet (4 mg total) by mouth every 6 (six) hours as needed for nausea. 06/24/15  Yes Enedina Finner, MD  potassium chloride (K-DUR,KLOR-CON) 10 MEQ tablet Take 2 tablets by mouth daily.   Yes Historical Provider, MD  RENVELA 800 MG tablet Take 1,600 mg by mouth 3 (three) times daily with meals. And one tablet with snacks 06/17/15  Yes Historical Provider, MD  Skin Protectants, Misc. (EUCERIN) cream Apply topically as needed for dry skin.   Yes Historical Provider, MD  tiotropium (SPIRIVA) 18 MCG inhalation capsule Place 1 capsule (18 mcg total) into inhaler and inhale daily. 08/22/15  Yes  Carollee Leitzarrie M Doss, NP    REVIEW OF SYSTEMS:  Review of Systems  Constitutional: Positive for chills. Negative for fever, weight loss and malaise/fatigue.  HENT: Negative for ear pain, hearing loss and tinnitus.   Eyes: Negative for blurred vision, double vision, pain and redness.  Respiratory: Positive for cough. Negative for hemoptysis and shortness of breath.   Cardiovascular: Negative for chest pain, palpitations, orthopnea and leg swelling.  Gastrointestinal: Positive for nausea, vomiting and diarrhea. Negative for abdominal pain and constipation.  Genitourinary: Negative for dysuria, frequency and hematuria.  Musculoskeletal: Negative for back pain, joint pain and neck pain.  Skin:       No acne, rash, or lesions  Neurological: Negative for dizziness, tremors, focal weakness and weakness.  Endo/Heme/Allergies: Negative for polydipsia. Does not bruise/bleed easily.  Psychiatric/Behavioral: Negative for depression. The patient is not nervous/anxious and does not have insomnia.      VITAL SIGNS:   Filed Vitals:   10/11/15 1856  BP: 121/83  Pulse: 75  Temp: 98.1 F (36.7 C)  TempSrc: Oral  Resp: 20  Height: 5\' 10"  (1.778 m)  Weight: 52.617 kg (116 lb)  SpO2: 96%   Wt Readings from Last 3 Encounters:  10/11/15 52.617 kg (116 lb)   08/22/15 53.797 kg (118 lb 9.6 oz)  08/08/15 54.885 kg (121 lb)    PHYSICAL EXAMINATION:  Physical Exam  Vitals reviewed. Constitutional: She is oriented to person, place, and time. She appears well-developed and well-nourished. No distress.  HENT:  Head: Normocephalic and atraumatic.  dry mucous membranes  Eyes: Conjunctivae and EOM are normal. Pupils are equal, round, and reactive to light. No scleral icterus.  Neck: Normal range of motion. Neck supple. No JVD present. No thyromegaly present.  Cardiovascular: Normal rate, regular rhythm and intact distal pulses.  Exam reveals no gallop and no friction rub.   No murmur heard. Respiratory: Effort normal and breath sounds normal. No respiratory distress. She has no wheezes. She has no rales.  GI: Soft. Bowel sounds are normal. She exhibits no distension. There is no tenderness.  Musculoskeletal: Normal range of motion. She exhibits no edema.  No arthritis, no gout  Lymphadenopathy:    She has no cervical adenopathy.  Neurological: She is alert and oriented to person, place, and time. No cranial nerve deficit.  No dysarthria, no aphasia  Skin: Skin is warm and dry. No rash noted. No erythema.  Psychiatric: She has a normal mood and affect. Her behavior is normal. Judgment and thought content normal.    LABORATORY PANEL:   CBC  Recent Labs Lab 10/11/15 1858  WBC 5.5  HGB 9.8*  HCT 29.5*  PLT 145*   ------------------------------------------------------------------------------------------------------------------  Chemistries   Recent Labs Lab 10/11/15 1858  NA 136  K 3.6  CL 96*  CO2 23  GLUCOSE 91  BUN 67*  CREATININE 9.82*  CALCIUM 7.4*  AST 21  ALT 15  ALKPHOS 106  BILITOT 0.5   ------------------------------------------------------------------------------------------------------------------  Cardiac Enzymes No results for input(s): TROPONINI in the last 168  hours. ------------------------------------------------------------------------------------------------------------------  RADIOLOGY:  No results found.  EKG:   Orders placed or performed during the hospital encounter of 10/11/15  . ED EKG  . ED EKG  . EKG 12-Lead  . EKG 12-Lead    IMPRESSION AND PLAN:  Principal Problem:   Abdominal pain - associated nausea vomiting diarrhea, see below. Unclear etiology at this time. White count not elevated. No blood noted in stool per patient report. Unclear if related  to her peritoneal dialysis or not. Patient is not toxic, but is dehydrated. See below. Admit with consultation to nephrology. Hold peritoneal dialysis tonight per Dr. Garnett Farm recommendation. When necessary pain control. Active Problems:   Nausea & vomiting - when necessary antiemetics. Diet ordered, the patient can take by mouth as tolerated.   ESRD on peritoneal dialysis [facility]) - hold peritoneal dialysis tonight. Other labs look relatively stable this time. Probably consultation as above.   Dehydration - gentle IV fluids throughout the night tonight to rehydrate.   Hypertension - cycle of this time, continue home antihypertensives.   GERD (gastroesophageal reflux disease) - equivalent home dose PPI   Paroxysmal atrial fibrillation (HCC) - continue home rate controlling medication  All the records are reviewed and case discussed with ED provider. Management plans discussed with the patient and/or family.  DVT PROPHYLAXIS: Subcutaneous heparin  ADMISSION STATUS: Observation  CODE STATUS: Full  TOTAL TIME TAKING CARE OF THIS PATIENT: 45 minutes.    Dorothy Long FIELDING 10/11/2015, 8:52 PM  [facility]  Office  [phone]  CC: Primary care physician; Sherlene Shams, MD

## 2015-10-11 NOTE — ED Notes (Signed)
Pt reports that she has been having diarrhea since last night with nausea and lower abd pain.

## 2015-10-11 NOTE — ED Notes (Signed)
hospitalist at bedside

## 2015-10-11 NOTE — ED Provider Notes (Signed)
[facility] Emergency Department Provider Note REMINDER - THIS NOTE IS NOT A FINAL MEDICAL RECORD UNTIL IT IS SIGNED. UNTIL THEN, THE CONTENT BELOW MAY REFLECT INFORMATION FROM A DOCUMENTATION TEMPLATE, NOT THE ACTUAL PATIENT VISIT. ____________________________________________  Time seen: Approximately 8:17 PM  I have reviewed the triage vital signs and the nursing notes.   HISTORY  Chief Complaint Diarrhea and Abdominal Pain    HPI Dorothy Long is a 54 y.o. female history of end-stage renal disease on her nail dialysis.  Patient reports she started having nausea yesterday and then this morning started having crampy lower abdominal pain with associated watery loose stools times about 8. She's been very dehydrated, lightheaded and feeling weak and generally dizzy. She denies any chest pain though she does occasionally get pain in her back, but this is a chronic recurring problem. No shortness of breath. No fevers or chills. No blood in her stool.  Describes a achy crampy lower abdominal pain with associated loose stools. Denies any recent antibiotic treatment.  She spoke with her nephrologist Dr. Cherylann Ratel who advised her to come to the hospital and recommended admission overnight.   Past Medical History  Diagnosis Date  . Hypertension   . Hyperlipidemia   . COPD (chronic obstructive pulmonary disease) (HCC)     a. not on home O2  . Anemia   . Valvular disease     a. 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 echo 2015: EF 45-50%, borderline LVH, mod dilated LA, mildly dilated RA, small pericardial effusion, mod MR (rheumatic deformity), MS mild-mod, no gradient, PASP ele  . Chronic combined systolic and diastolic CHF (congestive heart failure) (HCC)     a. echo reports above  .  PAF (paroxysmal atrial fibrillation) (HCC)     a. in setting of diarrhea 09/21/2013; b. not on long term full dose anticoagulation; c. CHADSVASC at least 4 (CHF, HTN, DM, female)  . Diabetes mellitus (HCC)   . Peritoneal dialysis status (HCC) 4- 15-15  . Ventral hernia   . Inguinal hernia   . History of stress test     a. 2014: no convincing evidence of pharmacologically induced ischemia,  apparent ant apical defect present only on attenuation corrected images favored to reflect artifact due to overcorrection & may be worse on stress imaging secondary to greater patient motion. A small area of ischemia is felt less likely but is difficult to entirely exclude, EF 55%  . ESRD (end stage renal disease) on dialysis (HCC)     a. HD on T,T,S; b. 2/2 focal segmental glomerulosclerosis   . Dialysis patient [facility])     Patient Active Problem List   Diagnosis Date Noted  . Abdominal pain 10/11/2015  . ESRD on peritoneal dialysis (HCC) 10/11/2015  . Rash and nonspecific skin eruption 08/08/2015  . Pruritus 07/22/2015  . Chronic combined systolic and diastolic CHF (congestive heart failure) (HCC)   . Hospital discharge follow-up 06/28/2015  . Diarrhea 06/22/2015  . Fatty liver 06/22/2015  . Nausea & vomiting 06/22/2015  . SBP (spontaneous bacterial peritonitis) (HCC) 06/22/2015  . Chest pain 06/15/2015  . TMJ (sprain of temporomandibular joint) 05/21/2015  . Postmenopausal atrophic vaginitis 06/19/2014  . Allergic rhinitis 05/29/2014  . Bilateral leg edema 05/15/2014  . Awaiting organ transplant 12/23/2013  . Hyperparathyroidism due to  renal insufficiency (HCC) 12/23/2013  . Excess fluid volume 09/27/2013  . Paroxysmal atrial fibrillation (HCC) 09/25/2013  . GERD (gastroesophageal reflux disease) 08/12/2013  . Focal and segmental hyalinosis 08/08/2013  . Mitral stenosis with incompetence or regurgitation 07/19/2013  . Anemia in chronic kidney disease 07/19/2013  . End stage kidney disease  (HCC) 07/14/2013  . Tobacco abuse counseling 12/20/2012  . Vitamin D deficiency 07/22/2012  . Hyperlipidemia   . Hypertension 07/21/2012  . Family history of colon cancer 07/21/2012  . History of tobacco abuse 07/21/2012    Past Surgical History  Procedure Laterality Date  . Abdominal hysterectomy    . Cholecystectomy    . Knee surgery Right   . Foot surgery Bilateral   . Av fistula placement Left 10-26-13    Dr Gilda Crease  . Peritoneal catheter insertion  03-29-14    Dr Gilda Crease  . Inguinal hernia repair Left 07/13/2015    Procedure: HERNIA REPAIR INGUINAL ADULT;  Surgeon: Earline Mayotte, MD;  Location: [facility] ORS;  Service: General;  Laterality: Left;  Marland Kitchen Ventral hernia repair N/A 07/13/2015    Procedure: HERNIA REPAIR VENTRAL ADULT;  Surgeon: Earline Mayotte, MD;  Location: [facility] ORS;  Service: General;  Laterality: N/A;    Current Outpatient Rx  Name  Route  Sig  Dispense  Refill  . acetaminophen (TYLENOL) 325 MG tablet   Oral   Take 325 mg by mouth every 4 (four) hours as needed for mild pain.          Marland Kitchen albuterol (VENTOLIN HFA) 108 (90 BASE) MCG/ACT inhaler   Inhalation   Inhale 1-2 puffs into the lungs every 6 (six) hours as needed for wheezing or shortness of breath.          . ALPRAZolam (XANAX) 0.25 MG tablet   Oral   Take 1 tablet (0.25 mg total) by mouth 3 (three) times daily as needed for sleep.   30 tablet   0   . amoxicillin-clavulanate (AUGMENTIN) 500-125 MG per tablet   Oral   Take 1 tablet by mouth every 12 (twelve) hours.         Gean Quint 210 MG TABS   Oral   Take 3 tablets by mouth 3 (three) times daily.           Dispense as written.   Marland Kitchen azelastine (OPTIVAR) 0.05 % ophthalmic solution      1 drop at bedtime.         . Calcium Acetate 667 MG TABS   Oral   Take 3 capsules by mouth 3 (three) times daily.         . cinacalcet (SENSIPAR) 90 MG tablet   Oral   Take 180 mg by mouth at bedtime. With the largest meal of the day          . cyclobenzaprine (FLEXERIL) 10 MG tablet   Oral   Take 1 tablet (10 mg total) by mouth 3 (three) times daily as needed for muscle spasms.   30 tablet   0   . diphenhydrAMINE (BENADRYL) 25 MG tablet   Oral   Take 25 mg by mouth every other day. MON, WED, FRI BEFORE DIALYSIS         . erythromycin ophthalmic ointment      1 application at bedtime as needed.         . fluorometholone (FML) 0.1 % ophthalmic suspension      1 drop at bedtime as needed.         Marland Kitchen  fluticasone (FLONASE) 50 MCG/ACT nasal spray   Each Nare   Place 2 sprays into both nostrils daily.   16 g   2   . furosemide (LASIX) 80 MG tablet   Oral   Take 40 mg by mouth daily.          . hydrOXYzine (ATARAX/VISTARIL) 10 MG tablet   Oral   Take 1 tablet (10 mg total) by mouth every 6 (six) hours as needed for itching.   30 tablet   0   . Mag Aspart-Potassium Aspart (POTASSIUM & MAGNESIUM ASPARTAT) 250-250 MG CAPS   Oral   Take 2 tablets by mouth daily.         . metolazone (ZAROXOLYN) 5 MG tablet   Oral   Take 5 mg by mouth daily as needed.         . metoprolol tartrate (LOPRESSOR) 25 MG tablet   Oral   Take 25 mg by mouth 2 (two) times daily.         . ondansetron (ZOFRAN) 4 MG tablet   Oral   Take 1 tablet (4 mg total) by mouth every 6 (six) hours as needed for nausea.   20 tablet   0   . potassium chloride (K-DUR,KLOR-CON) 10 MEQ tablet   Oral   Take 2 tablets by mouth daily.         Marland Kitchen. RENVELA 800 MG tablet   Oral   Take 1,600 mg by mouth 3 (three) times daily with meals. And one tablet with snacks           Dispense as written.   . Skin Protectants, Misc. (EUCERIN) cream   Topical   Apply topically as needed for dry skin.         Marland Kitchen. tiotropium (SPIRIVA) 18 MCG inhalation capsule   Inhalation   Place 1 capsule (18 mcg total) into inhaler and inhale daily.   30 capsule   0     Allergies Ciprocinonide; Demerol; Diflucan; Flagyl; Hydrocodone; Levaquin; Morphine  and related; Tetracyclines & related; and Valium  Family History  Problem Relation Age of Onset  . Cancer Mother     colon  . Heart disease Father   . Hypertension Father     Social History Social History  Substance Use Topics  . Smoking status: Current Some Day Smoker -- 0.50 packs/day for 25 years    Types: Cigarettes  . Smokeless tobacco: Never Used  . Alcohol Use: No     Comment: occasional    Review of Systems Constitutional: No fever/chills Eyes: No visual changes. ENT: No sore throat. Cardiovascular: Denies chest pain. Respiratory: Denies shortness of breath. Gastrointestinal:  No constipation. Genitourinary: Negative for dysuria. She does occasionally make very small amounts of urine, but is not made any today. Musculoskeletal: Negative for back pain. Skin: Negative for rash. Neurological: Negative for headaches, focal weakness or numbness.  10-point ROS otherwise negative.  ____________________________________________   PHYSICAL EXAM:  VITAL SIGNS: ED Triage Vitals  Enc Vitals Group     BP 10/11/15 1856 121/83 mmHg     Pulse Rate 10/11/15 1856 75     Resp 10/11/15 1856 20     Temp 10/11/15 1856 98.1 F (36.7 C)     Temp Source 10/11/15 1856 Oral     SpO2 10/11/15 1856 96 %     Weight 10/11/15 1856 116 lb (52.617 kg)     Height 10/11/15 1856 5\' 10"  (1.778 m)     Head  Cir --      Peak Flow --      Pain Score 10/11/15 1856 7     Pain Loc --      Pain Edu? --      Excl. in GC? --    Constitutional: Alert and oriented. Well appearing and in no acute distress. Eyes: Conjunctivae are normal. PERRL. EOMI. Head: Atraumatic. Nose: No congestion/rhinnorhea. Mouth/Throat: Mucous membranes are dry.  Oropharynx non-erythematous. Neck: No stridor.   Cardiovascular: Normal rate, regular rhythm. Grossly normal heart sounds.  Good peripheral circulation. Respiratory: Normal respiratory effort.  No retractions. Lungs CTAB. Gastrointestinal: Soft and nontender  for minimal discomfort over the suprapubic region without rebound or guarding. No distention. No abdominal bruits. No CVA tenderness. PT catheter site is clean dry and intact. No evidence of ascites. Musculoskeletal: No lower extremity tenderness nor edema.  No joint effusions. Neurologic:  Normal speech and language. No gross focal neurologic deficits are appreciated. Skin:  Skin is warm, dry and intact. No rash noted. Psychiatric: Mood and affect are normal. Speech and behavior are normal.  ____________________________________________   LABS (all labs ordered are listed, but only abnormal results are displayed)  Labs Reviewed  LIPASE, BLOOD - Abnormal; Notable for the following:    Lipase 53 (*)    All other components within normal limits  COMPREHENSIVE METABOLIC PANEL - Abnormal; Notable for the following:    Chloride 96 (*)    BUN 67 (*)    Creatinine, Ser 9.82 (*)    Calcium 7.4 (*)    Total Protein 6.2 (*)    Albumin 2.8 (*)    GFR calc non Af Amer 4 (*)    GFR calc Af Amer 5 (*)    Anion gap 17 (*)    All other components within normal limits  CBC - Abnormal; Notable for the following:    RBC 3.51 (*)    Hemoglobin 9.8 (*)    HCT 29.5 (*)    RDW 17.3 (*)    Platelets 145 (*)    All other components within normal limits  URINALYSIS COMPLETEWITH MICROSCOPIC ([facility] ONLY)   ____________________________________________  EKG  Reviewed and interpreted by me Ventricular rate 75 PR interval 180 QRS 100 QTc 510 Incomplete right bundle-branch block with expected minimal T-wave inversion in V1 and V2 No acute ST elevation, no hyperacute T waves Reviewed and interpreted as normal sinus rhythm with incomplete right bundle-branch block, minimal prolonged QT ____________________________________________  RADIOLOGY   ____________________________________________   PROCEDURES  Procedure(s) performed: None  Critical Care performed:  No  ____________________________________________   INITIAL IMPRESSION / ASSESSMENT AND PLAN / ED COURSE  Pertinent labs & imaging results that were available during my care of the patient were reviewed by me and considered in my medical decision making (see chart for details).  Patient presents with loose watery stool for approximate 1 day with associated nausea and crampy pain. She is afebrile. Exam is reassuring, no evidence of acute intra-abdominal infection based on clinical examination or history. I do suspect the patient likely suffering from mild dehydration and likely some colitis type symptoms. Called and discussed with her nephrologist Dr. Cherylann Ratel who recommended that the patient be admitted for overnight observation, the patient also wishes to be admitted and observed overnight for hydration. Dr. Cherylann Ratel advised to hold peritoneal dialysis and hydrate. I discussed with Dr. Cherylann Ratel that I am not sure the patient lives really make inpatient criteria, and I also discussed with the patient both still  advise admission and I will defer to the hospitalist for further inpatient treatment. ----------------------------------------- 8:37 PM on 10/11/2015 -----------------------------------------   Patient does report some improvement with Zofran and fluids. ____________________________________________   FINAL CLINICAL IMPRESSION(S) / ED DIAGNOSES  Final diagnoses:  Colitis  Dehydration  ESRD on peritoneal dialysis [facility])      Sharyn Creamer, MD 10/11/15 2037

## 2015-10-12 LAB — CBC
HCT: 28.1 % — ABNORMAL LOW (ref 35.0–47.0)
Hemoglobin: 9.3 g/dL — ABNORMAL LOW (ref 12.0–16.0)
MCH: 28.3 pg (ref 26.0–34.0)
MCHC: 33.2 g/dL (ref 32.0–36.0)
MCV: 85.2 fL (ref 80.0–100.0)
PLATELETS: 127 10*3/uL — AB (ref 150–440)
RBC: 3.3 MIL/uL — ABNORMAL LOW (ref 3.80–5.20)
RDW: 17.4 % — AB (ref 11.5–14.5)
WBC: 4.4 10*3/uL (ref 3.6–11.0)

## 2015-10-12 LAB — BASIC METABOLIC PANEL
Anion gap: 14 (ref 5–15)
BUN: 67 mg/dL — ABNORMAL HIGH (ref 6–20)
CALCIUM: 7.5 mg/dL — AB (ref 8.9–10.3)
CO2: 19 mmol/L — AB (ref 22–32)
CREATININE: 9.62 mg/dL — AB (ref 0.44–1.00)
Chloride: 104 mmol/L (ref 101–111)
GFR calc Af Amer: 5 mL/min — ABNORMAL LOW (ref >60)
GFR calc non Af Amer: 4 mL/min — ABNORMAL LOW (ref >60)
GLUCOSE: 83 mg/dL (ref 65–99)
Potassium: 4.1 mmol/L (ref 3.5–5.1)
Sodium: 137 mmol/L (ref 135–145)

## 2015-10-12 LAB — SURGICAL PCR SCREEN
MRSA, PCR: NEGATIVE
Staphylococcus aureus: NEGATIVE

## 2015-10-12 MED ORDER — ONDANSETRON 4 MG PO TBDP: 4.0000 mg | ORAL_TABLET | Freq: Three times a day (TID) | ORAL | Status: DC | PRN

## 2015-10-12 NOTE — Discharge Instructions (Signed)
°  DIET:  °Renal diet ° °DISCHARGE CONDITION:  °Stable ° °ACTIVITY:  °Activity as tolerated ° °OXYGEN:  °Home Oxygen: No. °  °Oxygen Delivery: room air ° °DISCHARGE LOCATION:  °home  ° °If you experience worsening of your admission symptoms, develop shortness of breath, life threatening emergency, suicidal or homicidal thoughts you must seek medical attention immediately by calling 911 or calling your MD immediately  if symptoms less severe. ° °You Must read complete instructions/literature along with all the possible adverse reactions/side effects for all the Medicines you take and that have been prescribed to you. Take any new Medicines after you have completely understood and accpet all the possible adverse reactions/side effects.  ° °Please note ° °You were cared for by a hospitalist during your hospital stay. If you have any questions about your discharge medications or the care you received while you were in the hospital after you are discharged, you can call the unit and asked to speak with the hospitalist on call if the hospitalist that took care of you is not available. Once you are discharged, your primary care physician will handle any further medical issues. Please note that NO REFILLS for any discharge medications will be authorized once you are discharged, as it is imperative that you return to your primary care physician (or establish a relationship with a primary care physician if you do not have one) for your aftercare needs so that they can reassess your need for medications and monitor your lab values. ° ° ° °

## 2015-10-12 NOTE — Progress Notes (Signed)
   10/12/15 1300  Clinical Encounter Type  Visited With Patient not available;Health care provider  Visit Type Spiritual support  Referral From Nurse  Consult/Referral To Nurse  Chaplain went to visit patient to see if she wanted to complete a HCPOA. Upon arrival to the unit was informed that she may have been discharged. Checked in patient's room and she was not present on the way out stff informed me she left. Was unable to give HCPOA information. Chaplain Vickie Melnik A. Candice Tobey Ext. [phone]

## 2015-10-21 NOTE — Discharge Summary (Signed)
[facility] [place] [facility]   PATIENT NAME: Dorothy Long    MR#:  [id number]  DATE OF BIRTH:  09/15/1961  DATE OF ADMISSION:  10/11/2015 ADMITTING PHYSICIAN: Oralia Manisavid Willis, MD  DATE OF DISCHARGE: 10/12/2015  2:10 PM  PRIMARY CARE PHYSICIAN: Sherlene ShamsULLO, TERESA L, MD    ADMISSION DIAGNOSIS:  Dehydration [E86.0] Colitis [K52.9] ESRD on peritoneal dialysis (HCC) [N18.6, Z99.2]  DISCHARGE DIAGNOSIS:  Principal Problem:   Abdominal pain Active Problems:   Hypertension   GERD (gastroesophageal reflux disease)   Paroxysmal atrial fibrillation (HCC)   Nausea & vomiting   Chronic combined systolic and diastolic CHF (congestive heart failure) (HCC)   ESRD on peritoneal dialysis (HCC)   Dehydration   SECONDARY DIAGNOSIS:   Past Medical History  Diagnosis Date  . Hypertension   . Hyperlipidemia   . COPD (chronic obstructive pulmonary disease) (HCC)     a. not on home O2  . Anemia   . Valvular disease     a. 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 echo 2015: EF 45-50%, borderline LVH, mod dilated LA, mildly dilated RA, small pericardial effusion, mod MR (rheumatic deformity), MS mild-mod, no gradient, PASP ele  . Chronic combined systolic and diastolic CHF (congestive heart failure) (HCC)     a. echo reports above  . PAF (paroxysmal atrial fibrillation) (HCC)     a. in setting of diarrhea 09/21/2013; b. not on long term full dose anticoagulation; c. CHADSVASC at least 4 (CHF, HTN, DM, female)  . Diabetes mellitus (HCC)   . Peritoneal dialysis status (HCC) 4- 15-15  . Ventral hernia   . Inguinal hernia   . History of stress test     a. 2014: no convincing evidence of pharmacologically induced ischemia,  apparent ant apical defect present only on attenuation corrected images  favored to reflect artifact due to overcorrection & may be worse on stress imaging secondary to greater patient motion. A small area of ischemia is felt less likely but is difficult to entirely exclude, EF 55%  . ESRD (end stage renal disease) on dialysis (HCC)     a. HD on T,T,S; b. 2/2 focal segmental glomerulosclerosis   . Dialysis patient [facility](HCC)      ADMITTING HISTORY  Dorothy Long is a 54 y.o. female who presents with 1 day of abdominal pain with nausea, vomiting, and diarrhea. Patient states she's begun to feel significantly dehydrated. She is an end-stage renal disease patient on peritoneal dialysis. She sees Dr. Cherylann RatelLateef with nephrology. She called him prior to coming to the ED, and he recommended that she come in to be evaluated. Lab workup in the ED is relatively stable, but the patient is clinically dehydrated. She also still suffering from significant nausea. ED physician communicated with Dr. Cherylann RatelLateef, who recommended that she be admitted for observation and to consult him so that he can see her in the morning. Hospitalists were called for admission.   HOSPITAL COURSE:   * Admitted to medical floor. She likely had viral gastroenteritis which improved with symptomatic management. Afebrile. Normal WBC. By day of discharge patient has tolerated food without vomiting/diarrhea/abd pain. Discharge home.    CONSULTS OBTAINED:     DRUG ALLERGIES:   Allergies  Allergen Reactions  . Ciprocinonide [Fluocinolone] Hives  . Demerol [Meperidine] Nausea And  Vomiting  . Diflucan [Fluconazole] Nausea Only  . Flagyl [Metronidazole] Hives and Itching  . Hydrocodone Itching  . Levaquin [Levofloxacin In D5w] Other (See Comments)    Numbness in legs  . Morphine And Related Nausea And Vomiting  . Tetracyclines & Related Itching and Swelling  . Valium [Diazepam] Nausea Only    hallucinations    DISCHARGE MEDICATIONS:   Discharge Medication List as of 10/12/2015 12:10 PM    START  taking these medications   Details  ondansetron (ZOFRAN ODT) 4 MG disintegrating tablet Take 1 tablet (4 mg total) by mouth every 8 (eight) hours as needed for nausea or vomiting., Starting 10/12/2015, Until Discontinued, Normal      CONTINUE these medications which have NOT CHANGED   Details  acetaminophen (TYLENOL) 325 MG tablet Take 325 mg by mouth every 4 (four) hours as needed for mild pain. , Until Discontinued, Historical Med    albuterol (VENTOLIN HFA) 108 (90 BASE) MCG/ACT inhaler Inhale 1-2 puffs into the lungs every 6 (six) hours as needed for wheezing or shortness of breath. , Until Discontinued, Historical Med    ALPRAZolam (XANAX) 0.25 MG tablet Take 1 tablet (0.25 mg total) by mouth 3 (three) times daily as needed for sleep., Starting 06/16/2015, Until Discontinued, Print    AURYXIA 210 MG TABS Take 3 tablets by mouth 3 (three) times daily., Until Discontinued, Historical Med    Calcium Acetate 667 MG TABS Take 3 capsules by mouth 3 (three) times daily., Until Discontinued, Historical Med    cinacalcet (SENSIPAR) 90 MG tablet Take 90 mg by mouth at bedtime. , Until Discontinued, Historical Med    cyclobenzaprine (FLEXERIL) 10 MG tablet Take 1 tablet (10 mg total) by mouth 3 (three) times daily as needed for muscle spasms., Starting 05/21/2015, Until Discontinued, Normal    diphenhydrAMINE (BENADRYL) 25 MG tablet Take 25 mg by mouth every other day. MON, WED, FRI BEFORE DIALYSIS, Until Discontinued, Historical Med    fluticasone (FLONASE) 50 MCG/ACT nasal spray Place 2 sprays into both nostrils daily., Starting 08/22/2015, Until Discontinued, Normal    furosemide (LASIX) 40 MG tablet Take 40-80 mg by mouth daily., Until Discontinued, Historical Med    hydrOXYzine (ATARAX/VISTARIL) 10 MG tablet Take 1 tablet (10 mg total) by mouth every 6 (six) hours as needed for itching., Starting 08/08/2015, Until Discontinued, Normal    metoprolol succinate (TOPROL-XL) 25 MG 24 hr tablet Take  25 mg by mouth daily., Until Discontinued, Historical Med    ondansetron (ZOFRAN) 4 MG tablet Take 1 tablet (4 mg total) by mouth every 6 (six) hours as needed for nausea., Starting 06/24/2015, Until Discontinued, Normal    potassium chloride (K-DUR,KLOR-CON) 10 MEQ tablet Take 2 tablets by mouth daily., Until Discontinued, Historical Med    RENVELA 800 MG tablet Take 1,600 mg by mouth 3 (three) times daily with meals. And one tablet with snacks, Starting 06/17/2015, Until Discontinued, Historical Med    Skin Protectants, Misc. (EUCERIN) cream Apply topically as needed for dry skin., Until Discontinued, Historical Med    tiotropium (SPIRIVA) 18 MCG inhalation capsule Place 1 capsule (18 mcg total) into inhaler and inhale daily., Starting 08/22/2015, Until Discontinued, Normal         Today    VITAL SIGNS:  Blood pressure 115/70, pulse 70, temperature 97.5 F (36.4 C), temperature source Oral, resp. rate 16, height  (1.778 m), weight 53.343 kg (117 lb 9.6 oz), SpO2 100 %.  I/O:  No intake or output data  in the 24 hours ending 10/21/15 1258  PHYSICAL EXAMINATION:  Physical Exam  GENERAL:  54 y.o.-year-old patient lying in the bed with no acute distress.  LUNGS: Normal breath sounds bilaterally, no wheezing, rales,rhonchi or crepitation. No use of accessory muscles of respiration.  CARDIOVASCULAR: S1, S2 normal. No murmurs, rubs, or gallops.  ABDOMEN: Soft, non-tender, non-distended. Bowel sounds present. No organomegaly or mass.  NEUROLOGIC: Moves all 4 extremities. PSYCHIATRIC: The patient is alert and oriented x 3.  SKIN: No obvious rash, lesion, or ulcer.   DATA REVIEW:   CBC No results for input(s): WBC, HGB, HCT, PLT in the last 168 hours.  Chemistries  No results for input(s): NA, K, CL, CO2, GLUCOSE, BUN, CREATININE, CALCIUM, MG, AST, ALT, ALKPHOS, BILITOT in the last 168 hours.  Invalid input(s): GFRCGP  Cardiac Enzymes No results for input(s): TROPONINI in the  last 168 hours.  Microbiology Results  Results for orders placed or performed during the hospital encounter of 10/11/15  Surgical pcr screen     Status: None   Collection Time: 10/11/15  7:33 PM  Result Value Ref Range Status   MRSA, PCR NEGATIVE NEGATIVE Final   Staphylococcus aureus NEGATIVE NEGATIVE Final    Comment:        The Xpert SA Assay (FDA approved for NASAL specimens in patients over 7 years of age), is one component of a comprehensive surveillance program.  Test performance has been validated by [facility] for patients greater than or equal to 36 year old. It is not intended to diagnose infection nor to guide or monitor treatment.     RADIOLOGY:  No results found.    Follow up with PCP in 1 week.  Management plans discussed with the patient, family and they are in agreement.  CODE STATUS:   TOTAL TIME TAKING CARE OF THIS PATIENT ON DAY OF DISCHARGE: more than 30 minutes.    Milagros Loll R M.D on 10/21/2015 at 12:58 PM  Between 7am to 6pm - Pager - [phone]  After 6pm go to [email or web address] - password [email or web address]  [facility]  Office  [phone]  CC: Primary care physician; Sherlene Shams, MD     Note: This dictation was prepared with Dragon dictation along with smaller phrase technology. Any transcriptional errors that result from this process are unintentional.

## 2015-11-12 ENCOUNTER — Encounter: Payer: Self-pay | Admitting: Internal Medicine

## 2015-11-12 ENCOUNTER — Ambulatory Visit (INDEPENDENT_AMBULATORY_CARE_PROVIDER_SITE_OTHER): Payer: BC Managed Care – PPO | Admitting: Internal Medicine

## 2015-11-12 ENCOUNTER — Telehealth: Payer: Self-pay | Admitting: Internal Medicine

## 2015-11-12 VITALS — BP 106/66 | HR 123 | Temp 98.4°F | Resp 12 | Ht 70.0 in | Wt 111.8 lb

## 2015-11-12 DIAGNOSIS — N185 Chronic kidney disease, stage 5: Secondary | ICD-10-CM

## 2015-11-12 DIAGNOSIS — N952 Postmenopausal atrophic vaginitis: Secondary | ICD-10-CM

## 2015-11-12 DIAGNOSIS — Z992 Dependence on renal dialysis: Secondary | ICD-10-CM

## 2015-11-12 DIAGNOSIS — R002 Palpitations: Secondary | ICD-10-CM | POA: Diagnosis not present

## 2015-11-12 DIAGNOSIS — R040 Epistaxis: Secondary | ICD-10-CM | POA: Diagnosis not present

## 2015-11-12 MED ORDER — MUPIROCIN 2 % EX OINT: 1.0000 "application " | TOPICAL_OINTMENT | Freq: Two times a day (BID) | CUTANEOUS | Status: DC

## 2015-11-12 MED ORDER — ESTROGENS, CONJUGATED 0.625 MG/GM VA CREA: 1.0000 | TOPICAL_CREAM | Freq: Every day | VAGINAL | Status: DC

## 2015-11-12 NOTE — Patient Instructions (Signed)
I am treating you for atrophic vaginitis with a vaginal estrogen cream.  Use it every night x 2 week,s, then reduce use to two times per week  If no better in 2 weeks,  Call for GYN referral to make sure it is not due to "lichen sclerosis" or Paget's disease   Atrophic Vaginitis Atrophic vaginitis is a condition in which the tissues that line the vagina become dry and thin. This condition is most common in women who have stopped having regular menstrual periods (menopause). This usually starts when a woman is 19-82 years old. Estrogen helps to keep the vagina moist. It stimulates the vagina to produce a clear fluid that lubricates the vagina for sexual intercourse. This fluid also protects the vagina from infection. Lack of estrogen can cause the lining of the vagina to get thinner and dryer. The vagina may also shrink in size. It may become less elastic. Atrophic vaginitis tends to get worse over time as a woman's estrogen level drops. CAUSES This condition is caused by the normal drop in estrogen that happens around the time of menopause. RISK FACTORS Certain conditions or situations may lower a woman's estrogen level, which increases her risk of atrophic vaginitis. These include:  Taking medicine that blocks estrogen.  Having ovaries removed surgically.  Being treated for cancer with X-ray treatment (radiation) or medicines (chemotherapy).  Exercising very hard and often.  Having an eating disorder (anorexia).  Giving birth or breastfeeding.  Being over the age of 53.  Smoking. SYMPTOMS Symptoms of this condition include:  Pain, soreness, or bleeding during sexual intercourse (dyspareunia).  Vaginal burning, irritation, or itching.  Pain or bleeding during a vaginal examination using a speculum (pelvic exam).  Loss of interest in sexual activity.  Having burning pain when passing urine.  Vaginal discharge that is brown or yellow. In some cases, there are no  symptoms. DIAGNOSIS This condition is diagnosed with a medical history and physical exam. This will include a pelvic exam that checks whether the inside of your vagina appears pale, thin, or dry. Rarely, you may also have other tests, including:  A urine test.  A test that checks the acid balance in your vaginal fluid (acid balance test). TREATMENT Treatment for this condition may depend on the severity of your symptoms. Treatment may include:  Using an over-the-counter vaginal lubricant before you have sexual intercourse.  Using a long-acting vaginal moisturizer.  Using low-dose vaginal estrogen for moderate to severe symptoms that do not respond to other treatments. Options include creams, tablets, and inserts (vaginal rings). Before using vaginal estrogen, tell your health care provider if you have a history of:  Breast cancer.  Endometrial cancer.  Blood clots.  Taking medicines. You may be able to take a daily pill for dyspareunia. Discuss all of the risks of this medicine with your health care provider. It is usually not recommended for women who have a family history or personal history of breast cancer. If your symptoms are very mild and you are not sexually active, you may not need treatment. HOME CARE INSTRUCTIONS  Take medicines only as directed by your health care provider. Do not use herbal or alternative medicines unless your health care provider says that you can.  Use over-the-counter creams, lubricants, or moisturizers for dryness only as directed by your health care provider.  If your atrophic vaginitis is caused by menopause, discuss all of your menopausal symptoms and treatment options with your health care provider.  Do not douche.  Do not use products that can make your vagina dry. These include:  Scented feminine sprays.  Scented tampons.  Scented soaps.  If it hurts to have sex, talk with your sexual partner. SEEK MEDICAL CARE IF:  Your discharge  looks different than normal.  Your vagina has an unusual smell.  You have new symptoms.  Your symptoms do not improve with treatment.  Your symptoms get worse.   This information is not intended to replace advice given to you by your health care provider. Make sure you discuss any questions you have with your health care provider.   Document Released: 04/17/2015 Document Reviewed: 04/17/2015 Elsevier Interactive Patient Education [employer]2016 Elsevier Inc.

## 2015-11-12 NOTE — Progress Notes (Signed)
Subjective:  Patient ID: Dorothy Long, female    DOB: 03-May-1961  Age: 54 y.o. MRN: [id number]  CC: The primary encounter diagnosis was Palpitations. Diagnoses of Postmenopausal atrophic vaginitis, Chronic kidney disease with peritoneal dialysis as preferred modality, stage 5 (HCC), and Nosebleed were also pertinent to this visit.  HPI Dorothy Long presents for vaginal itching and  Burning.  Symptoms have been present for several weeks,  And during the past week has noticed scant amoutns of blood when wiping after urinating.  She denies flank pain and abdominal pain.  She is post menopausal ,s/p hysterectomy in 1997, and has not been sexually active in 2 years..  Does not use vaginal estrogen currently but was prescribed vagifem in the distant past.   2) Dryness and  scaling of nasal passages,  Some bleeding . Using flonase daily.  Started with the onset of cold weather and use of dry heat in the house. No nosebleeds,  Just streaks when she blows nose,   Outpatient Prescriptions Prior to Visit  Medication Sig Dispense Refill  . acetaminophen (TYLENOL) 325 MG tablet Take 325 mg by mouth every 4 (four) hours as needed for mild pain.     Marland Kitchen albuterol (VENTOLIN HFA) 108 (90 BASE) MCG/ACT inhaler Inhale 1-2 puffs into the lungs every 6 (six) hours as needed for wheezing or shortness of breath.     . ALPRAZolam (XANAX) 0.25 MG tablet Take 1 tablet (0.25 mg total) by mouth 3 (three) times daily as needed for sleep. 30 tablet 0  . AURYXIA 210 MG TABS Take 3 tablets by mouth 3 (three) times daily.    . Calcium Acetate 667 MG TABS Take 3 capsules by mouth 3 (three) times daily.    . cinacalcet (SENSIPAR) 90 MG tablet Take 90 mg by mouth at bedtime.     . cyclobenzaprine (FLEXERIL) 10 MG tablet Take 1 tablet (10 mg total) by mouth 3 (three) times daily as needed for muscle spasms. 30 tablet 0  . diphenhydrAMINE (BENADRYL) 25 MG tablet Take 25 mg by mouth every other day. MON, WED, FRI BEFORE  DIALYSIS    . fluticasone (FLONASE) 50 MCG/ACT nasal spray Place 2 sprays into both nostrils daily. 16 g 2  . furosemide (LASIX) 40 MG tablet Take 40-80 mg by mouth daily.    . hydrOXYzine (ATARAX/VISTARIL) 10 MG tablet Take 1 tablet (10 mg total) by mouth every 6 (six) hours as needed for itching. 30 tablet 0  . metoprolol succinate (TOPROL-XL) 25 MG 24 hr tablet Take 25 mg by mouth daily.    . ondansetron (ZOFRAN ODT) 4 MG disintegrating tablet Take 1 tablet (4 mg total) by mouth every 8 (eight) hours as needed for nausea or vomiting. 20 tablet 0  . ondansetron (ZOFRAN) 4 MG tablet Take 1 tablet (4 mg total) by mouth every 6 (six) hours as needed for nausea. 20 tablet 0  . potassium chloride (K-DUR,KLOR-CON) 10 MEQ tablet Take 2 tablets by mouth daily.    Marland Kitchen RENVELA 800 MG tablet Take 1,600 mg by mouth 3 (three) times daily with meals. And one tablet with snacks    . Skin Protectants, Misc. (EUCERIN) cream Apply topically as needed for dry skin.    Marland Kitchen tiotropium (SPIRIVA) 18 MCG inhalation capsule Place 1 capsule (18 mcg total) into inhaler and inhale daily. 30 capsule 0   No facility-administered medications prior to visit.    Review of Systems;  Patient denies headache, fevers, malaise, unintentional weight  loss, skin rash, eye pain, sinus congestion and sinus pain, sore throat, dysphagia,  hemoptysis , cough, dyspnea, wheezing, chest pain, palpitations, orthopnea, edema, abdominal pain, nausea, melena, diarrhea, constipation, flank pain, dysuria, hematuria, urinary  Frequency, nocturia, numbness, tingling, seizures,  Focal weakness, Loss of consciousness,  Tremor, insomnia, depression, anxiety, and suicidal ideation.      Objective:  BP 106/66 mmHg  Pulse 123  Temp(Src) 98.4 F (36.9 C) (Oral)  Resp 12  Ht 5\' 10"  (1.778 m)  Wt 111 lb 12 oz (50.689 kg)  BMI 16.03 kg/m2  SpO2 98%  BP Readings from Last 3 Encounters:  11/12/15 106/66  10/12/15 115/70  08/22/15 118/80    Wt  Readings from Last 3 Encounters:  11/12/15 111 lb 12 oz (50.689 kg)  10/12/15 117 lb 9.6 oz (53.343 kg)  08/22/15 118 lb 9.6 oz (53.797 kg)    General Appearance:    Alert, cooperative, no distress, appears stated age  Head:    Normocephalic, without obvious abnormality, atraumatic  Eyes:    PERRL, conjunctiva/corneas clear, EOM's intact, fundi    benign, both eyes  Ears:    Normal TM's and external ear canals, both ears  Nose:   Nares normal, septum midline, mucosa red,  Some open sores,  Scant bleeding no  sinus tenderness  Back:     Symmetric, no curvature, ROM normal, no CVA tenderness  Lungs:     Clear to auscultation bilaterally, respirations unlabored  Chest Wall:    No tenderness or deformity   Heart:    Regular rate and rhythm, S1 and S2 normal, no murmur, rub   or gallop  Genitalia:   cervix surgically absent, external genitalia normal, no adnexal masses or tenderness,  rectovaginal septum normal, uterus surgically  absent and vaginal walls pale and thin,  Excoriation noted at introitus  Near urethra, no  discharge   No results found for: HGBA1C  Lab Results  Component Value Date   CREATININE 9.62* 10/12/2015   CREATININE 9.82* 10/11/2015   CREATININE 6.38* 07/02/2015    Lab Results  Component Value Date   WBC 4.4 10/12/2015   HGB 9.3* 10/12/2015   HCT 28.1* 10/12/2015   PLT 127* 10/12/2015   GLUCOSE 83 10/12/2015   CHOL 120 05/03/2014   TRIG 120 05/03/2014   HDL 44 05/03/2014   LDLDIRECT 164.6 07/21/2012   LDLCALC 52 05/03/2014   ALT 15 10/11/2015   AST 21 10/11/2015   NA 137 10/12/2015   K 4.1 10/12/2015   CL 104 10/12/2015   CREATININE 9.62* 10/12/2015   BUN 67* 10/12/2015   CO2 19* 10/12/2015   TSH 0.83 07/10/2013   INR 1.10 06/14/2015   MICROALBUR 63.1* 06/19/2014    No results found.  Assessment & Plan:   Problem List Items Addressed This Visit    Chronic kidney disease with peritoneal dialysis as preferred modality, stage 5 (HCC)    Managed  with peritoneal dialysis       Postmenopausal atrophic vaginitis    Pelvic exam normal,  Except for atrophic changes.  Premarin cream prescribed,  If no improvemement wil refer to GYN for biopsy to rue out Paget's and LS        Nosebleed    Stop flonase,  Start mupirocin bid.        Other Visit Diagnoses    Palpitations    -  Primary    Relevant Orders    EKG 12-Lead (Completed)  I am having Ms. Elting start on conjugated estrogens and mupirocin ointment. I am also having her maintain her acetaminophen, diphenhydrAMINE, Calcium Acetate, albuterol, cyclobenzaprine, cinacalcet, ALPRAZolam, RENVELA, ondansetron, AURYXIA, potassium chloride, eucerin, hydrOXYzine, tiotropium, fluticasone, furosemide, metoprolol succinate, and ondansetron.  Meds ordered this encounter  Medications  . conjugated estrogens (PREMARIN) vaginal cream    Sig: Place 1 Applicatorful vaginally daily. For two weeks, then reduce use to wtice weekly    Dispense:  42.5 g    Refill:  12  . mupirocin ointment (BACTROBAN) 2 %    Sig: Place 1 application into the nose 2 (two) times daily.    Dispense:  22 g    Refill:  0    There are no discontinued medications.  Follow-up: No Follow-up on file.   Sherlene Shams, MD

## 2015-11-12 NOTE — Telephone Encounter (Signed)
Regarding her bleeding nose,  stop the Flonase, start using mupirocin antbacterial ointment .  Apply with q tip to the inside of nose twice daily

## 2015-11-12 NOTE — Progress Notes (Signed)
Pre-visit discussion using our clinic review tool. No additional management support is needed unless otherwise documented below in the visit note.  

## 2015-11-12 NOTE — Telephone Encounter (Signed)
Patient notified and voiced understanding.

## 2015-11-13 DIAGNOSIS — R040 Epistaxis: Secondary | ICD-10-CM | POA: Insufficient documentation

## 2015-11-13 NOTE — Assessment & Plan Note (Signed)
Pelvic exam normal,  Except for atrophic changes.  Premarin cream prescribed,  If no improvemement wil refer to GYN for biopsy to rue out Paget's and LS

## 2015-11-13 NOTE — Assessment & Plan Note (Signed)
Managed with peritoneal dialysis

## 2015-11-13 NOTE — Assessment & Plan Note (Signed)
Stop flonase,  Start mupirocin bid.

## 2015-11-15 DIAGNOSIS — I639 Cerebral infarction, unspecified: Secondary | ICD-10-CM

## 2015-11-15 HISTORY — DX: Cerebral infarction, unspecified: I63.9

## 2015-11-17 ENCOUNTER — Inpatient Hospital Stay: Payer: BC Managed Care – PPO

## 2015-11-17 ENCOUNTER — Emergency Department: Payer: BC Managed Care – PPO

## 2015-11-17 ENCOUNTER — Inpatient Hospital Stay
Admission: EM | Admit: 2015-11-17 | Discharge: 2015-11-20 | DRG: 306 | Disposition: A | Discharge status: Home / Self Care | Payer: BC Managed Care – PPO | Attending: Internal Medicine | Admitting: Internal Medicine

## 2015-11-17 ENCOUNTER — Encounter: Payer: Self-pay | Admitting: Emergency Medicine

## 2015-11-17 DIAGNOSIS — Z9889 Other specified postprocedural states: Secondary | ICD-10-CM | POA: Diagnosis not present

## 2015-11-17 DIAGNOSIS — I120 Hypertensive chronic kidney disease with stage 5 chronic kidney disease or end stage renal disease: Secondary | ICD-10-CM | POA: Diagnosis present

## 2015-11-17 DIAGNOSIS — Z8249 Family history of ischemic heart disease and other diseases of the circulatory system: Secondary | ICD-10-CM

## 2015-11-17 DIAGNOSIS — R55 Syncope and collapse: Secondary | ICD-10-CM | POA: Diagnosis present

## 2015-11-17 DIAGNOSIS — Z992 Dependence on renal dialysis: Secondary | ICD-10-CM

## 2015-11-17 DIAGNOSIS — I6522 Occlusion and stenosis of left carotid artery: Secondary | ICD-10-CM

## 2015-11-17 DIAGNOSIS — I4510 Unspecified right bundle-branch block: Secondary | ICD-10-CM | POA: Diagnosis present

## 2015-11-17 DIAGNOSIS — Z888 Allergy status to other drugs, medicaments and biological substances status: Secondary | ICD-10-CM

## 2015-11-17 DIAGNOSIS — I639 Cerebral infarction, unspecified: Secondary | ICD-10-CM | POA: Diagnosis present

## 2015-11-17 DIAGNOSIS — J449 Chronic obstructive pulmonary disease, unspecified: Secondary | ICD-10-CM | POA: Diagnosis present

## 2015-11-17 DIAGNOSIS — Z9049 Acquired absence of other specified parts of digestive tract: Secondary | ICD-10-CM

## 2015-11-17 DIAGNOSIS — I480 Paroxysmal atrial fibrillation: Secondary | ICD-10-CM | POA: Diagnosis present

## 2015-11-17 DIAGNOSIS — Z800 Family history of malignant neoplasm of digestive organs: Secondary | ICD-10-CM | POA: Diagnosis not present

## 2015-11-17 DIAGNOSIS — N189 Chronic kidney disease, unspecified: Secondary | ICD-10-CM | POA: Diagnosis not present

## 2015-11-17 DIAGNOSIS — I6350 Cerebral infarction due to unspecified occlusion or stenosis of unspecified cerebral artery: Secondary | ICD-10-CM | POA: Diagnosis not present

## 2015-11-17 DIAGNOSIS — I083 Combined rheumatic disorders of mitral, aortic and tricuspid valves: Secondary | ICD-10-CM | POA: Diagnosis present

## 2015-11-17 DIAGNOSIS — R4781 Slurred speech: Secondary | ICD-10-CM | POA: Diagnosis present

## 2015-11-17 DIAGNOSIS — D631 Anemia in chronic kidney disease: Secondary | ICD-10-CM | POA: Diagnosis present

## 2015-11-17 DIAGNOSIS — K7460 Unspecified cirrhosis of liver: Secondary | ICD-10-CM | POA: Diagnosis present

## 2015-11-17 DIAGNOSIS — I5042 Chronic combined systolic (congestive) and diastolic (congestive) heart failure: Secondary | ICD-10-CM | POA: Diagnosis present

## 2015-11-17 DIAGNOSIS — E1122 Type 2 diabetes mellitus with diabetic chronic kidney disease: Secondary | ICD-10-CM | POA: Diagnosis present

## 2015-11-17 DIAGNOSIS — I272 Other secondary pulmonary hypertension: Secondary | ICD-10-CM | POA: Diagnosis present

## 2015-11-17 DIAGNOSIS — I052 Rheumatic mitral stenosis with insufficiency: Secondary | ICD-10-CM | POA: Diagnosis present

## 2015-11-17 DIAGNOSIS — D696 Thrombocytopenia, unspecified: Secondary | ICD-10-CM | POA: Diagnosis present

## 2015-11-17 DIAGNOSIS — Z8673 Personal history of transient ischemic attack (TIA), and cerebral infarction without residual deficits: Secondary | ICD-10-CM | POA: Insufficient documentation

## 2015-11-17 DIAGNOSIS — E876 Hypokalemia: Secondary | ICD-10-CM | POA: Diagnosis present

## 2015-11-17 DIAGNOSIS — F419 Anxiety disorder, unspecified: Secondary | ICD-10-CM | POA: Diagnosis present

## 2015-11-17 DIAGNOSIS — Z90710 Acquired absence of both cervix and uterus: Secondary | ICD-10-CM

## 2015-11-17 DIAGNOSIS — N186 End stage renal disease: Secondary | ICD-10-CM | POA: Diagnosis present

## 2015-11-17 DIAGNOSIS — Z7682 Awaiting organ transplant status: Secondary | ICD-10-CM

## 2015-11-17 DIAGNOSIS — G8194 Hemiplegia, unspecified affecting left nondominant side: Secondary | ICD-10-CM | POA: Diagnosis present

## 2015-11-17 DIAGNOSIS — E785 Hyperlipidemia, unspecified: Secondary | ICD-10-CM | POA: Diagnosis present

## 2015-11-17 DIAGNOSIS — F17210 Nicotine dependence, cigarettes, uncomplicated: Secondary | ICD-10-CM | POA: Diagnosis present

## 2015-11-17 DIAGNOSIS — I6529 Occlusion and stenosis of unspecified carotid artery: Secondary | ICD-10-CM | POA: Insufficient documentation

## 2015-11-17 DIAGNOSIS — Z79899 Other long term (current) drug therapy: Secondary | ICD-10-CM

## 2015-11-17 DIAGNOSIS — I059 Rheumatic mitral valve disease, unspecified: Secondary | ICD-10-CM | POA: Diagnosis not present

## 2015-11-17 DIAGNOSIS — Z885 Allergy status to narcotic agent status: Secondary | ICD-10-CM

## 2015-11-17 DIAGNOSIS — N2581 Secondary hyperparathyroidism of renal origin: Secondary | ICD-10-CM | POA: Diagnosis present

## 2015-11-17 DIAGNOSIS — M6281 Muscle weakness (generalized): Secondary | ICD-10-CM

## 2015-11-17 DIAGNOSIS — I6310 Cerebral infarction due to embolism of unspecified precerebral artery: Secondary | ICD-10-CM | POA: Diagnosis not present

## 2015-11-17 LAB — CBC
HCT: 30.5 % — ABNORMAL LOW (ref 35.0–47.0)
HEMATOCRIT: 32.3 % — AB (ref 35.0–47.0)
HEMOGLOBIN: 10.5 g/dL — AB (ref 12.0–16.0)
Hemoglobin: 9.7 g/dL — ABNORMAL LOW (ref 12.0–16.0)
MCH: 27.9 pg (ref 26.0–34.0)
MCH: 27.3 pg (ref 26.0–34.0)
MCHC: 32.4 g/dL (ref 32.0–36.0)
MCHC: 31.9 g/dL — ABNORMAL LOW (ref 32.0–36.0)
MCV: 86 fL (ref 80.0–100.0)
MCV: 85.7 fL (ref 80.0–100.0)
PLATELETS: 76 10*3/uL — AB (ref 150–440)
Platelets: 79 10*3/uL — ABNORMAL LOW (ref 150–440)
RBC: 3.76 MIL/uL — ABNORMAL LOW (ref 3.80–5.20)
RBC: 3.56 MIL/uL — AB (ref 3.80–5.20)
RDW: 18.8 % — ABNORMAL HIGH (ref 11.5–14.5)
RDW: 19.1 % — AB (ref 11.5–14.5)
WBC: 5.7 10*3/uL (ref 3.6–11.0)
WBC: 6.6 10*3/uL (ref 3.6–11.0)

## 2015-11-17 LAB — PROTIME-INR
INR: 1.13
Prothrombin Time: 14.7 seconds (ref 11.4–15.0)

## 2015-11-17 LAB — BASIC METABOLIC PANEL
Anion gap: 16 — ABNORMAL HIGH (ref 5–15)
BUN: 73 mg/dL — AB (ref 6–20)
CHLORIDE: 98 mmol/L — AB (ref 101–111)
CO2: 24 mmol/L (ref 22–32)
Calcium: 8.4 mg/dL — ABNORMAL LOW (ref 8.9–10.3)
Creatinine, Ser: 9.48 mg/dL — ABNORMAL HIGH (ref 0.44–1.00)
GFR calc Af Amer: 5 mL/min — ABNORMAL LOW (ref >60)
GFR calc non Af Amer: 4 mL/min — ABNORMAL LOW (ref >60)
GLUCOSE: 109 mg/dL — AB (ref 65–99)
POTASSIUM: 3.6 mmol/L (ref 3.5–5.1)
Sodium: 138 mmol/L (ref 135–145)

## 2015-11-17 LAB — APTT: APTT: 29 s (ref 24–36)

## 2015-11-17 LAB — GLUCOSE, CAPILLARY: Glucose-Capillary: 89 mg/dL (ref 65–99)

## 2015-11-17 MED ORDER — SEVELAMER CARBONATE 800 MG PO TABS
1600.0000 mg | ORAL_TABLET | Freq: Three times a day (TID) | ORAL | Status: DC
  Administered 2015-11-18 – 2015-11-19 (×6): 1600 mg via ORAL
  Filled 2015-11-17 (×8): qty 2

## 2015-11-17 MED ORDER — ASPIRIN 81 MG PO CHEW
324.0000 mg | CHEWABLE_TABLET | Freq: Once | ORAL | Status: AC
  Administered 2015-11-17: 324 mg via ORAL
  Filled 2015-11-17: qty 4

## 2015-11-17 MED ORDER — FERRIC CITRATE 1 GM 210 MG(FE) PO TABS: 3.0000 | ORAL_TABLET | Freq: Three times a day (TID) | ORAL | Status: DC

## 2015-11-17 MED ORDER — FUROSEMIDE 40 MG PO TABS
40.0000 mg | ORAL_TABLET | Freq: Every day | ORAL | Status: DC
  Administered 2015-11-19 – 2015-11-20 (×2): 40 mg via ORAL
  Filled 2015-11-17 (×2): qty 1

## 2015-11-17 MED ORDER — SEVELAMER CARBONATE 800 MG PO TABS
800.0000 mg | ORAL_TABLET | ORAL | Status: DC
  Administered 2015-11-18 – 2015-11-20 (×3): 800 mg via ORAL
  Filled 2015-11-17 (×3): qty 1

## 2015-11-17 MED ORDER — DIPHENHYDRAMINE HCL 25 MG PO CAPS
25.0000 mg | ORAL_CAPSULE | ORAL | Status: DC
  Administered 2015-11-17: 25 mg via ORAL
  Filled 2015-11-17 (×4): qty 1

## 2015-11-17 MED ORDER — ALBUTEROL SULFATE (2.5 MG/3ML) 0.083% IN NEBU: 2.5000 mg | INHALATION_SOLUTION | Freq: Four times a day (QID) | RESPIRATORY_TRACT | Status: DC | PRN

## 2015-11-17 MED ORDER — ALBUTEROL SULFATE HFA 108 (90 BASE) MCG/ACT IN AERS: 1.0000 | INHALATION_SPRAY | Freq: Four times a day (QID) | RESPIRATORY_TRACT | Status: DC | PRN

## 2015-11-17 MED ORDER — POTASSIUM CHLORIDE CRYS ER 20 MEQ PO TBCR
20.0000 meq | EXTENDED_RELEASE_TABLET | Freq: Every day | ORAL | Status: DC
  Administered 2015-11-18 – 2015-11-20 (×3): 20 meq via ORAL
  Filled 2015-11-17 (×6): qty 1

## 2015-11-17 MED ORDER — METOPROLOL SUCCINATE ER 25 MG PO TB24
25.0000 mg | ORAL_TABLET | Freq: Every day | ORAL | Status: DC
  Administered 2015-11-17 – 2015-11-19 (×2): 25 mg via ORAL
  Filled 2015-11-17 (×2): qty 1

## 2015-11-17 MED ORDER — CYCLOBENZAPRINE HCL 10 MG PO TABS: 10.0000 mg | ORAL_TABLET | Freq: Three times a day (TID) | ORAL | Status: DC | PRN

## 2015-11-17 MED ORDER — ONDANSETRON HCL 4 MG/2ML IJ SOLN: 4.0000 mg | Freq: Four times a day (QID) | INTRAMUSCULAR | Status: DC | PRN

## 2015-11-17 MED ORDER — TIOTROPIUM BROMIDE MONOHYDRATE 18 MCG IN CAPS
18.0000 ug | ORAL_CAPSULE | Freq: Every day | RESPIRATORY_TRACT | Status: DC
  Administered 2015-11-18 – 2015-11-20 (×3): 18 ug via RESPIRATORY_TRACT
  Filled 2015-11-17: qty 5

## 2015-11-17 MED ORDER — LORAZEPAM 2 MG PO TABS: 2.0000 mg | ORAL_TABLET | Freq: Once | ORAL | Status: DC

## 2015-11-17 MED ORDER — HYDROCERIN EX CREA
TOPICAL_CREAM | CUTANEOUS | Status: DC | PRN
  Filled 2015-11-17: qty 113

## 2015-11-17 MED ORDER — ESTROGENS, CONJUGATED 0.625 MG/GM VA CREA
1.0000 | TOPICAL_CREAM | Freq: Every day | VAGINAL | Status: DC
  Filled 2015-11-17: qty 30

## 2015-11-17 MED ORDER — ALPRAZOLAM 0.25 MG PO TABS
0.2500 mg | ORAL_TABLET | Freq: Three times a day (TID) | ORAL | Status: DC | PRN
  Administered 2015-11-18 – 2015-11-19 (×2): 0.25 mg via ORAL
  Filled 2015-11-17 (×2): qty 1

## 2015-11-17 MED ORDER — CALCIUM ACETATE (PHOS BINDER) 667 MG PO CAPS
2001.0000 mg | ORAL_CAPSULE | Freq: Three times a day (TID) | ORAL | Status: DC
  Administered 2015-11-18 – 2015-11-20 (×7): 2001 mg via ORAL
  Filled 2015-11-17 (×8): qty 3

## 2015-11-17 MED ORDER — CINACALCET HCL 30 MG PO TABS
90.0000 mg | ORAL_TABLET | Freq: Every day | ORAL | Status: DC
  Administered 2015-11-17 – 2015-11-19 (×3): 90 mg via ORAL
  Filled 2015-11-17 (×3): qty 3

## 2015-11-17 MED ORDER — SODIUM CHLORIDE 0.9 % IJ SOLN
3.0000 mL | Freq: Two times a day (BID) | INTRAMUSCULAR | Status: DC
  Administered 2015-11-17 – 2015-11-20 (×5): 3 mL via INTRAVENOUS

## 2015-11-17 MED ORDER — LORAZEPAM 2 MG/ML IJ SOLN
1.0000 mg | Freq: Once | INTRAMUSCULAR | Status: AC
  Administered 2015-11-17: 2 mg via INTRAVENOUS

## 2015-11-17 MED ORDER — LORAZEPAM 2 MG/ML IJ SOLN
INTRAMUSCULAR | Status: AC
  Administered 2015-11-17: 18:00:00
  Filled 2015-11-17: qty 1

## 2015-11-17 MED ORDER — ONDANSETRON HCL 4 MG PO TABS: 4.0000 mg | ORAL_TABLET | Freq: Four times a day (QID) | ORAL | Status: DC | PRN

## 2015-11-17 MED ORDER — FUROSEMIDE 40 MG PO TABS: 40.0000 mg | ORAL_TABLET | Freq: Every day | ORAL | Status: DC

## 2015-11-17 MED ORDER — ONDANSETRON 4 MG PO TBDP
4.0000 mg | ORAL_TABLET | Freq: Three times a day (TID) | ORAL | Status: DC | PRN
  Filled 2015-11-17: qty 1

## 2015-11-17 MED ORDER — MUPIROCIN 2 % EX OINT: 1.0000 "application " | TOPICAL_OINTMENT | Freq: Two times a day (BID) | CUTANEOUS | Status: DC

## 2015-11-17 MED ORDER — FLUTICASONE PROPIONATE 50 MCG/ACT NA SUSP
2.0000 | Freq: Every day | NASAL | Status: DC
  Administered 2015-11-17 – 2015-11-20 (×4): 2 via NASAL
  Filled 2015-11-17: qty 16

## 2015-11-17 MED ORDER — HYDROXYZINE HCL 10 MG PO TABS: 10.0000 mg | ORAL_TABLET | Freq: Four times a day (QID) | ORAL | Status: DC | PRN

## 2015-11-17 MED ORDER — ASPIRIN EC 325 MG PO TBEC
325.0000 mg | DELAYED_RELEASE_TABLET | Freq: Every day | ORAL | Status: DC
Start: 2015-11-18 — End: 2015-11-20
  Administered 2015-11-18 – 2015-11-20 (×3): 325 mg via ORAL
  Filled 2015-11-17 (×3): qty 1

## 2015-11-17 MED ORDER — ACETAMINOPHEN 650 MG RE SUPP: 650.0000 mg | Freq: Four times a day (QID) | RECTAL | Status: DC | PRN

## 2015-11-17 MED ORDER — CALCIUM ACETATE 667 MG PO TABS: 3.0000 | ORAL_TABLET | Freq: Three times a day (TID) | ORAL | Status: DC

## 2015-11-17 MED ORDER — ACETAMINOPHEN 325 MG PO TABS
650.0000 mg | ORAL_TABLET | Freq: Four times a day (QID) | ORAL | Status: DC | PRN
  Administered 2015-11-18 – 2015-11-19 (×2): 650 mg via ORAL
  Filled 2015-11-17 (×2): qty 2

## 2015-11-17 NOTE — ED Notes (Signed)
Pt has periods of intermittent disorientation. 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 Pt's husband at bedside at this time.

## 2015-11-17 NOTE — ED Notes (Signed)
Per EMS pt was cooking at home in the kitchen when she had a syncopal episode at home. Per EMS pt was disoriented on scene but became more alert and oriented en route. EMS states patient was NSR with occasional PVC. Per patient she began having chest pain prior to her syncopal episode.

## 2015-11-17 NOTE — H&P (Signed)
[facility] - [place] at [facility])   PATIENT NAME: Dorothy Long    MR#:  [id number]  DATE OF BIRTH:  1961-10-02  DATE OF ADMISSION:  11/17/2015  PRIMARY CARE PHYSICIAN: Sherlene Shams, MD   REQUESTING/REFERRING PHYSICIAN: Dr. Jene Every  CHIEF COMPLAINT:   Chief Complaint  Patient presents with  . Loss of Consciousness  . Chest Pain    HISTORY OF PRESENT ILLNESS:  Dorothy Long  is a 54 y.o. female with a known history of end-stage renal disease on peritoneal dialysis, hypertension, hyperlipidemia, COPD with ongoing tobacco abuse, history of CHF, paroxysmal atrial fibrillation, diabetes who presents to the hospital due to slurred speech/confusion. Patient herself is a poor historian but the history obtained from the husband at bedside. As per the husband patient was doing some cleaning around the house in the kitchen and then shortly thereafter became lethargic and dizzy and then became somewhat confused. She was too weak and therefore crawled from the kitchen to her recliner to get some rest. She will call her husband on the phone and husband could not understand her due to some slurred speech. He called EMS and therefore she was brought to the ER for further evaluation. In the ER patient was noted to have a left-sided nasolabial fold droop.  She is suspected to have a possible CVA and therefore hospitalist services were contacted further treatment and evaluation. Patient did admit to a headache, nausea but no vomiting. She denies any numbness tingling, seizures.    PAST MEDICAL HISTORY:   Past Medical History  Diagnosis Date  . Hypertension   . Hyperlipidemia   . COPD (chronic obstructive pulmonary disease) (HCC)     a. not on home O2  . Anemia   . Valvular disease     a. echo 2014: EF 45-50%, mildly increased LV internal cavitiy, rheumatic mitral valve, severe MR/MS, mean grad 14 mmHg, sev thickening post leaflet cannot exc veg, mild Ao scl w/o  sten, mild TR, PASP likely under rated; b. echo 2015: EF 45-50%, borderline LVH, mod dilated LA, mildly dilated RA, small pericardial effusion, mod MR (rheumatic deformity), MS mild-mod, no gradient, PASP ele  . Chronic combined systolic and diastolic CHF (congestive heart failure) (HCC)     a. echo reports above  . PAF (paroxysmal atrial fibrillation) (HCC)     a. in setting of diarrhea 09/21/2013; b. not on long term full dose anticoagulation; c. CHADSVASC at least 4 (CHF, HTN, DM, female)  . Diabetes mellitus (HCC)   . Peritoneal dialysis status (HCC) 4- 15-15  . Ventral hernia   . Inguinal hernia   . History of stress test     a. 2014: no convincing evidence of pharmacologically induced ischemia,  apparent ant apical defect present only on attenuation corrected images favored to reflect artifact due to overcorrection & may be worse on stress imaging secondary to greater patient motion. A small area of ischemia is felt less likely but is difficult to entirely exclude, EF 55%  . ESRD (end stage renal disease) on dialysis (HCC)     a. HD on T,T,S; b. 2/2 focal segmental glomerulosclerosis   . Dialysis patient [facility])     PAST SURGICAL HISTORY:   Past Surgical History  Procedure Laterality Date  . Abdominal hysterectomy    . Cholecystectomy    . Knee surgery Right   . Foot surgery Bilateral   . Av fistula placement Left 10-26-13    Dr Gilda Crease  .  Peritoneal catheter insertion  03-29-14    Dr Gilda Crease  . Inguinal hernia repair Left 07/13/2015    Procedure: HERNIA REPAIR INGUINAL ADULT;  Surgeon: Earline Mayotte, MD;  Location: [facility] ORS;  Service: General;  Laterality: Left;  Marland Kitchen Ventral hernia repair N/A 07/13/2015    Procedure: HERNIA REPAIR VENTRAL ADULT;  Surgeon: Earline Mayotte, MD;  Location: [facility] ORS;  Service: General;  Laterality: N/A;    SOCIAL HISTORY:   Social History  Substance Use Topics  . Smoking status: Current Some Day Smoker -- 0.50 packs/day for 25 years    Types:  Cigarettes  . Smokeless tobacco: Never Used  . Alcohol Use: No     Comment: occasional    FAMILY HISTORY:   Family History  Problem Relation Age of Onset  . Cancer Mother     colon  . Heart disease Father   . Hypertension Father     DRUG ALLERGIES:   Allergies  Allergen Reactions  . Ciprocinonide [Fluocinolone] Hives  . Demerol [Meperidine] Nausea And Vomiting  . Diflucan [Fluconazole] Nausea Only  . Flagyl [Metronidazole] Hives and Itching  . Hydrocodone Itching  . Levaquin [Levofloxacin In D5w] Other (See Comments)    Numbness in legs  . Morphine And Related Nausea And Vomiting  . Tetracyclines & Related Itching and Swelling  . Valium [Diazepam] Nausea Only    hallucinations    REVIEW OF SYSTEMS:   Review of Systems  Constitutional: Negative for fever and weight loss.  HENT: Negative for congestion, nosebleeds and tinnitus.   Eyes: Negative for blurred vision, double vision and redness.  Respiratory: Negative for cough, hemoptysis and shortness of breath.   Cardiovascular: Negative for chest pain, orthopnea, leg swelling and PND.  Gastrointestinal: Negative for nausea, vomiting, abdominal pain, diarrhea and melena.  Genitourinary: Negative for dysuria, urgency and hematuria.  Musculoskeletal: Negative for joint pain and falls.  Neurological: Positive for dizziness, speech change and weakness (generalized). Negative for tingling, sensory change, focal weakness, seizures and headaches.  Endo/Heme/Allergies: Negative for polydipsia. Does not bruise/bleed easily.  Psychiatric/Behavioral: Negative for depression and memory loss. The patient is not nervous/anxious.     MEDICATIONS AT HOME:   Prior to Admission medications   Medication Sig Start Date End Date Taking? Authorizing Provider  acetaminophen (TYLENOL) 325 MG tablet Take 325 mg by mouth every 4 (four) hours as needed for mild pain.     Historical Provider, MD  albuterol (VENTOLIN HFA) 108 (90 BASE) MCG/ACT  inhaler Inhale 1-2 puffs into the lungs every 6 (six) hours as needed for wheezing or shortness of breath.     Historical Provider, MD  ALPRAZolam Prudy Feeler) 0.25 MG tablet Take 1 tablet (0.25 mg total) by mouth 3 (three) times daily as needed for sleep. 06/16/15   Houston Siren, MD  AURYXIA 210 MG TABS Take 3 tablets by mouth 3 (three) times daily.    Historical Provider, MD  Calcium Acetate 667 MG TABS Take 3 capsules by mouth 3 (three) times daily.    Historical Provider, MD  cinacalcet (SENSIPAR) 90 MG tablet Take 90 mg by mouth at bedtime.     Historical Provider, MD  diphenhydrAMINE (BENADRYL) 25 MG tablet Take 25 mg by mouth every other day. MON, WED, FRI BEFORE DIALYSIS    Historical Provider, MD  furosemide (LASIX) 40 MG tablet Take 40-80 mg by mouth daily.    Historical Provider, MD  metoprolol succinate (TOPROL-XL) 25 MG 24 hr tablet Take 25 mg by mouth  daily.    Historical Provider, MD  ondansetron (ZOFRAN ODT) 4 MG disintegrating tablet Take 1 tablet (4 mg total) by mouth every 8 (eight) hours as needed for nausea or vomiting. 10/12/15   Srikar Sudini, MD  RENVELA 800 MG tablet Take 1,600 mg by mouth 3 (three) times daily with meals. And one tablet with snacks 06/17/15   Historical Provider, MD  Skin Protectants, Misc. (EUCERIN) cream Apply topically as needed for dry skin.    Historical Provider, MD  tiotropium (SPIRIVA) 18 MCG inhalation capsule Place 1 capsule (18 mcg total) into inhaler and inhale daily. 08/22/15   Carollee Leitzarrie M Doss, NP      VITAL SIGNS:  Blood pressure 141/95, pulse 91, temperature 98.2 F (36.8 C), resp. rate 21, height 5\' 9"  (1.753 m), weight 49.896 kg (110 lb), SpO2 100 %.  PHYSICAL EXAMINATION:  Physical Exam  GENERAL:  54 y.o.-year-old patient lying in the bed with no acute distress.  EYES: Pupils equal, round, reactive to light and accommodation. No scleral icterus. Extraocular muscles intact.  HEENT: Head atraumatic, normocephalic. Oropharynx and nasopharynx  clear. No oropharyngeal erythema, moist oral mucosa  NECK:  Supple, no jugular venous distention. No thyroid enlargement, no tenderness.  LUNGS: Normal breath sounds bilaterally, no wheezing, rales, rhonchi. No use of accessory muscles of respiration.  CARDIOVASCULAR: S1, S2 RRR. No murmurs, rubs, gallops, clicks.  ABDOMEN: Soft, nontender, nondistended. Bowel sounds present. No organomegaly or mass. + PD catheter in place with no acute drainage.   EXTREMITIES: No pedal edema, cyanosis, or clubbing. + 2 pedal & radial pulses b/l.   NEUROLOGIC: Cranial nerves II through XII are intact. Left sided nasolabial droop.  Left sided weakness 3-4/5 strength.   PSYCHIATRIC: The patient is alert and oriented x 3. Good affect.  SKIN: No obvious rash, lesion, or ulcer.   LABORATORY PANEL:   CBC  Recent Labs Lab 11/17/15 1319  WBC 6.6  HGB 9.7*  HCT 30.5*  PLT 76*   ------------------------------------------------------------------------------------------------------------------  Chemistries   Recent Labs Lab 11/17/15 1110  NA 138  K 3.6  CL 98*  CO2 24  GLUCOSE 109*  BUN 73*  CREATININE 9.48*  CALCIUM 8.4*   ------------------------------------------------------------------------------------------------------------------  Cardiac Enzymes  Recent Labs Lab 11/17/15 1110  TROPONINI 0.14*   ------------------------------------------------------------------------------------------------------------------  RADIOLOGY:  Dg Chest 2 View  11/17/2015  CLINICAL DATA:  Chest pain EXAM: CHEST  2 VIEW COMPARISON:  08/21/2015 FINDINGS: Chronic cardiopericardial enlargement. There is no edema, consolidation, effusion, or pneumothorax. Small pneumoperitoneum, presumably from patient's peritoneal dialysis. There is no indication of abdominal pain. IMPRESSION: 1. No evidence of acute cardiopulmonary disease. 2. Pneumoperitoneum, presumably from patient's peritoneal dialysis access given no history  of abdominal pain. 3. Chronic cardiomegaly. Electronically Signed   By: Marnee SpringJonathon  Watts M.D.   On: 11/17/2015 12:11   Ct Head Wo Contrast  11/17/2015  CLINICAL DATA:  Syncopal episode cooking in the kitchen. EXAM: CT HEAD WITHOUT CONTRAST TECHNIQUE: Contiguous axial images were obtained from the base of the skull through the vertex without intravenous contrast. COMPARISON:  None available for review. FINDINGS: Skull and Sinuses:Negative for fracture or destructive process. The visualized mastoids, middle ears, and imaged paranasal sinuses are clear. Few gas bubbles along the right vertebral artery and in the bilateral cavernous sinus region consistent with intravenous gas from IV access. Orbits: No acute abnormality. Brain: No evidence of acute infarction, hemorrhage, hydrocephalus, or mass lesion/mass effect. 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 There are bilateral sulcal calcifications which are likely sequela of infection or inflammation. Atherosclerotic calcifications is much less likely given size and location. These results were called by telephone at the time of interpretation on 11/17/2015 at 12:32 pm to Dr. Jene Every , who verbally acknowledged these results. IMPRESSION: No acute finding. Electronically Signed   By: Marnee Spring M.D.   On: 11/17/2015 12:35     IMPRESSION AND PLAN:   54 year old female with past medical history of end-stage renal disease on peritoneal dialysis, hypertension, secondary hyperparathyroidism, COPD with ongoing tobacco abuse, anxiety who presents to the hospital due to confusion, slurred speech.    #1 acute CVA-this is suspected diagnosis given patient's slurred speech and left sided nasolabial fold droop. -The neurologist on call was consulted by the ER and she was in the window of getting thrombolytics but it is contraindicated given her  thrombocytopenia. -CT head on admission is negative, I will get MRI of the brain, carotid duplex, echo. -Continue aspirin, follow every 4 hours neuro checks. -I will get a physical therapy evaluation.  #2 end-stage renal disease on peritoneal dialysis-continue care as per nephrology.  #3 secondary hyperparathyroidism-continue Renvela, Sensipar.  #4 anxiety-continue Xanax.  #5 COPD-no acute exacerbation. Continue Spiriva.   All the records are reviewed and case discussed with ED provider. Management plans discussed with the patient, family and they are in agreement.  CODE STATUS: Full  TOTAL TIME TAKING CARE OF THIS PATIENT: 45 minutes.    Houston Siren M.D on 11/17/2015 at 3:14 PM  Between 7am to 6pm - Pager - [phone]  After 6pm go to [email or web address] - password [email or web address] [facility]  [facility] [place] [facility]  Office  [phone]  CC: Primary care physician; Sherlene Shams, MD

## 2015-11-17 NOTE — ED Notes (Signed)
Admitting physician at bedside

## 2015-11-17 NOTE — ED Provider Notes (Signed)
[facility] Emergency Department Provider Note  ____________________________________________  Time seen: 12 PM  I have reviewed the triage vital signs and the nursing notes.   HISTORY  Chief Complaint Dizziness   HPI Dorothy Long is a 54 y.o. female who reports that she was sweeping the floor this morning and felt "weird" like she was unable to maintain her balance and she had to get on the floor. This was at approximately 10:30 AM. She felt like she was unable to move at first but eventually was able to get into a chair. She reportedly called her husband and he states that she sounded jumbled and was not making sense. She is a history of peritoneal dialysis and hypertension and COPD. She is able to move all her extremities but reports she feels weak all over. She also reports that she did have chest tightness but it was brief and she no longer has this. She denies losing consciousness to me in contradiction to nursing note     Past Medical History  Diagnosis Date  . Hypertension   . Hyperlipidemia   . COPD (chronic obstructive pulmonary disease) (HCC)     a. not on home O2  . Anemia   . Valvular disease     a. 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 echo 2015: EF 45-50%, borderline LVH, mod dilated LA, mildly dilated RA, small pericardial effusion, mod MR (rheumatic deformity), MS mild-mod, no gradient, PASP ele  . Chronic combined systolic and diastolic CHF (congestive heart failure) (HCC)     a. echo reports above  . PAF (paroxysmal atrial fibrillation) (HCC)     a. in setting of diarrhea 09/21/2013; b. not on long term full dose anticoagulation; c. CHADSVASC at least 4 (CHF, HTN, DM, female)  . Diabetes mellitus (HCC)   . Peritoneal dialysis status (HCC) 4- 15-15  . Ventral  hernia   . Inguinal hernia   . History of stress test     a. 2014: no convincing evidence of pharmacologically induced ischemia,  apparent ant apical defect present only on attenuation corrected images favored to reflect artifact due to overcorrection & may be worse on stress imaging secondary to greater patient motion. A small area of ischemia is felt less likely but is difficult to entirely exclude, EF 55%  . ESRD (end stage renal disease) on dialysis (HCC)     a. HD on T,T,S; b. 2/2 focal segmental glomerulosclerosis   . Dialysis patient [facility])     Patient Active Problem List   Diagnosis Date Noted  . Nosebleed 11/13/2015  . Chronic combined systolic and diastolic CHF (congestive heart failure) (HCC)   . Hospital discharge follow-up 06/28/2015  . Diarrhea 06/22/2015  . Fatty liver 06/22/2015  . Nausea & vomiting 06/22/2015  . SBP (spontaneous bacterial peritonitis) (HCC) 06/22/2015  . Chest pain 06/15/2015  . TMJ (sprain of temporomandibular joint) 05/21/2015  . Postmenopausal atrophic vaginitis 06/19/2014  . Allergic rhinitis 05/29/2014  . Bilateral leg edema 05/15/2014  . Awaiting organ transplant 12/23/2013  . Hyperparathyroidism due to renal insufficiency (HCC) 12/23/2013  . Excess fluid volume 09/27/2013  . Paroxysmal atrial fibrillation (HCC) 09/25/2013  . GERD (gastroesophageal reflux disease) 08/12/2013  . Focal and segmental hyalinosis 08/08/2013  . Mitral stenosis with incompetence or regurgitation 07/19/2013  . Anemia in  chronic kidney disease 07/19/2013  . Chronic kidney disease with peritoneal dialysis as preferred modality, stage 5 (HCC) 07/14/2013  . Tobacco abuse counseling 12/20/2012  . Vitamin D deficiency 07/22/2012  . Hyperlipidemia   . Hypertension 07/21/2012  . Family history of colon cancer 07/21/2012  . History of tobacco abuse 07/21/2012    Past Surgical History  Procedure Laterality Date  . Abdominal hysterectomy    . Cholecystectomy    . Knee  surgery Right   . Foot surgery Bilateral   . Av fistula placement Left 10-26-13    Dr Gilda Crease  . Peritoneal catheter insertion  03-29-14    Dr Gilda Crease  . Inguinal hernia repair Left 07/13/2015    Procedure: HERNIA REPAIR INGUINAL ADULT;  Surgeon: Earline Mayotte, MD;  Location: [facility] ORS;  Service: General;  Laterality: Left;  Marland Kitchen Ventral hernia repair N/A 07/13/2015    Procedure: HERNIA REPAIR VENTRAL ADULT;  Surgeon: Earline Mayotte, MD;  Location: [facility] ORS;  Service: General;  Laterality: N/A;    Current Outpatient Rx  Name  Route  Sig  Dispense  Refill  . acetaminophen (TYLENOL) 325 MG tablet   Oral   Take 325 mg by mouth every 4 (four) hours as needed for mild pain.          Marland Kitchen albuterol (VENTOLIN HFA) 108 (90 BASE) MCG/ACT inhaler   Inhalation   Inhale 1-2 puffs into the lungs every 6 (six) hours as needed for wheezing or shortness of breath.          . ALPRAZolam (XANAX) 0.25 MG tablet   Oral   Take 1 tablet (0.25 mg total) by mouth 3 (three) times daily as needed for sleep.   30 tablet   0   . AURYXIA 210 MG TABS   Oral   Take 3 tablets by mouth 3 (three) times daily.           Dispense as written.   . Calcium Acetate 667 MG TABS   Oral   Take 3 capsules by mouth 3 (three) times daily.         . cinacalcet (SENSIPAR) 90 MG tablet   Oral   Take 90 mg by mouth at bedtime.          . conjugated estrogens (PREMARIN) vaginal cream   Vaginal   Place 1 Applicatorful vaginally daily. For two weeks, then reduce use to wtice weekly   42.5 g   12   . cyclobenzaprine (FLEXERIL) 10 MG tablet   Oral   Take 1 tablet (10 mg total) by mouth 3 (three) times daily as needed for muscle spasms.   30 tablet   0   . diphenhydrAMINE (BENADRYL) 25 MG tablet   Oral   Take 25 mg by mouth every other day. MON, WED, FRI BEFORE DIALYSIS         . fluticasone (FLONASE) 50 MCG/ACT nasal spray   Each Nare   Place 2 sprays into both nostrils daily.   16 g   2   .  furosemide (LASIX) 40 MG tablet   Oral   Take 40-80 mg by mouth daily.         . hydrOXYzine (ATARAX/VISTARIL) 10 MG tablet   Oral   Take 1 tablet (10 mg total) by mouth every 6 (six) hours as needed for itching.   30 tablet   0   . metoprolol succinate (TOPROL-XL) 25 MG 24 hr tablet   Oral   Take  25 mg by mouth daily.         . mupirocin ointment (BACTROBAN) 2 %   Nasal   Place 1 application into the nose 2 (two) times daily.   22 g   0   . ondansetron (ZOFRAN ODT) 4 MG disintegrating tablet   Oral   Take 1 tablet (4 mg total) by mouth every 8 (eight) hours as needed for nausea or vomiting.   20 tablet   0   . ondansetron (ZOFRAN) 4 MG tablet   Oral   Take 1 tablet (4 mg total) by mouth every 6 (six) hours as needed for nausea.   20 tablet   0   . potassium chloride (K-DUR,KLOR-CON) 10 MEQ tablet   Oral   Take 2 tablets by mouth daily.         Marland Kitchen RENVELA 800 MG tablet   Oral   Take 1,600 mg by mouth 3 (three) times daily with meals. And one tablet with snacks           Dispense as written.   . Skin Protectants, Misc. (EUCERIN) cream   Topical   Apply topically as needed for dry skin.         Marland Kitchen tiotropium (SPIRIVA) 18 MCG inhalation capsule   Inhalation   Place 1 capsule (18 mcg total) into inhaler and inhale daily.   30 capsule   0     Allergies Ciprocinonide; Demerol; Diflucan; Flagyl; Hydrocodone; Levaquin; Morphine and related; Tetracyclines & related; and Valium  Family History  Problem Relation Age of Onset  . Cancer Mother     colon  . Heart disease Father   . Hypertension Father     Social History Social History  Substance Use Topics  . Smoking status: Current Some Day Smoker -- 0.50 packs/day for 25 years    Types: Cigarettes  . Smokeless tobacco: Never Used  . Alcohol Use: No     Comment: occasional    Review of Systems  Constitutional: Negative for fever. Eyes: Negative for visual changes. ENT: Negative for sore  throat Cardiovascular: Negative for chest pain. Respiratory: Negative for shortness of breath. Gastrointestinal: Negative for abdominal pain, vomiting and diarrhea. Genitourinary: Negative for dysuria. Musculoskeletal: Negative for back pain. Skin: Negative for rash. Neurologic: Negative for headache Psychiatric: Positive for anxiety    ____________________________________________   PHYSICAL EXAM:  VITAL SIGNS: ED Triage Vitals  Enc Vitals Group     BP 11/17/15 1125 136/98 mmHg     Pulse Rate 11/17/15 1125 87     Resp 11/17/15 1125 18     Temp --      Temp src --      SpO2 11/17/15 1120 98 %     Weight 11/17/15 1125 110 lb (49.896 kg)     Height 11/17/15 1125  (1.753 m)     Head Cir --      Peak Flow --      Pain Score 11/17/15 1126 7     Pain Loc --      Pain Edu? --      Excl. in GC? --      Constitutional: Alert and oriented. No acute distress Eyes: Conjunctivae are normal. PERRLA, EOMI ENT   Head: Normocephalic and atraumatic.   Mouth/Throat: Mucous membranes are moist. Cardiovascular: Normal rate, regular rhythm. Normal and symmetric distal pulses are present in all extremities. No murmurs, rubs, or gallops. Respiratory: Normal respiratory effort without tachypnea nor retractions. Breath sounds  are clear and equal bilaterally.  Gastrointestinal: Soft and non-tender in all quadrants peritoneal dialysis access noted. No distention. There is no CVA tenderness. Genitourinary: deferred Musculoskeletal: Nontender with normal range of motion in all extremities. No lower extremity tenderness nor edema. Neurologic:  Normal speech and language. Questionable loss of left nasolabial fold. Extremity strength appears normal, sensation grossly intact. Skin:  Skin is warm, dry and intact. No rash noted. Psychiatric: Mood and affect are normal. Patient exhibits appropriate insight and judgment.  ____________________________________________    LABS (pertinent  positives/negatives)  Labs Reviewed  CBC - Abnormal; Notable for the following:    RBC 3.76 (*)    Hemoglobin 10.5 (*)    HCT 32.3 (*)    RDW 18.8 (*)    Platelets 79 (*)    All other components within normal limits  TROPONIN I  BASIC METABOLIC PANEL  URINALYSIS COMPLETEWITH MICROSCOPIC ([facility] ONLY)  APTT  PROTIME-INR  URINE DRUG SCREEN, QUALITATIVE ([facility] ONLY)    ____________________________________________   EKG  ED ECG REPORT I, Jene EveryKINNER, Lynnet Hefley, the attending physician, personally viewed and interpreted this ECG.   Date: 11/17/2015  EKG Time: 11:16 AM  Rate: 87  Rhythm: normal sinus rhythm, RBBB  Axis: Normal axis  Intervals:Prolonged QTC  ST&T Change: Nonspecific  ____________________________________________    RADIOLOGY I have personally reviewed any xrays that were ordered on this patient: CT head unremarkable  ____________________________________________   PROCEDURES  Procedure(s) performed: none  Critical Care performed: yes  CRITICAL CARE Performed by: Jene EveryKINNER, Jacky Hartung   Total critical care time:35 minutes  Critical care time was exclusive of separately billable procedures and treating other patients.  Critical care was necessary to treat or prevent imminent or life-threatening deterioration.  Critical care was time spent personally by me on the following activities: development of treatment plan with patient and/or surrogate as well as nursing, discussions with consultants, evaluation of patient's response to treatment, examination of patient, obtaining history from patient or surrogate, ordering and performing treatments and interventions, ordering and review of laboratory studies, ordering and review of radiographic studies, pulse oximetry and re-evaluation of patient's condition.   ____________________________________________   INITIAL IMPRESSION / ASSESSMENT AND PLAN / ED COURSE  Pertinent labs & imaging results that were available during  my care of the patient were reviewed by me and considered in my medical decision making (see chart for details).  After my exam, code stroke called. Onset of symptoms approximately 10:30 AM, slight delay in calling code stroke secondary to confusing story given to triage nurse did not initially seem consistent with stroke. NIH SS scale on my exam= 1   CT head is unremarkable per radiology  ----------------------------------------- 1:09 PM on 11/17/2015 -----------------------------------------  [facility] not recommending TPA given low platelets. He recommends aspirin and admission    Troponin is also elevated, the patient denies chest pain at this time. She did report some earlier today. In reviewing her prior labs her troponin is always elevated, I will not give heparin at this time  ----------------------------------------- 1:24 PM on 11/17/2015 -----------------------------------------  [facility] requesting a repeat CBC to see if initial platelet value was a lab abnormality  ----------------------------------------- 2:01 PM on 11/17/2015 -----------------------------------------  Platelets are 76,000  ____________________________________________   FINAL CLINICAL IMPRESSION(S) / ED DIAGNOSES  Final diagnoses:  Cerebral infarction due to unspecified mechanism     Jene Everyobert Kashus Karlen, MD 11/17/15 1402

## 2015-11-17 NOTE — ED Notes (Signed)
[facility]

## 2015-11-18 ENCOUNTER — Inpatient Hospital Stay (HOSPITAL_COMMUNITY)
Admit: 2015-11-18 | Discharge: 2015-11-18 | Disposition: A | Discharge status: Home / Self Care | Payer: BC Managed Care – PPO | Attending: Specialist | Admitting: Specialist

## 2015-11-18 ENCOUNTER — Inpatient Hospital Stay: Payer: BC Managed Care – PPO

## 2015-11-18 DIAGNOSIS — I6350 Cerebral infarction due to unspecified occlusion or stenosis of unspecified cerebral artery: Secondary | ICD-10-CM

## 2015-11-18 LAB — URINALYSIS COMPLETE WITH MICROSCOPIC (ARMC ONLY)
Bilirubin Urine: NEGATIVE
GLUCOSE, UA: NEGATIVE mg/dL
Ketones, ur: NEGATIVE mg/dL
NITRITE: NEGATIVE
Protein, ur: 100 mg/dL — AB
SPECIFIC GRAVITY, URINE: 1.015 (ref 1.005–1.030)
pH: 5 (ref 5.0–8.0)

## 2015-11-18 LAB — BASIC METABOLIC PANEL
Anion gap: 15 (ref 5–15)
BUN: 83 mg/dL — AB (ref 6–20)
CALCIUM: 7.6 mg/dL — AB (ref 8.9–10.3)
CHLORIDE: 104 mmol/L (ref 101–111)
CO2: 21 mmol/L — ABNORMAL LOW (ref 22–32)
CREATININE: 9.92 mg/dL — AB (ref 0.44–1.00)
GFR calc non Af Amer: 4 mL/min — ABNORMAL LOW (ref >60)
GFR, EST AFRICAN AMERICAN: 5 mL/min — AB (ref >60)
Glucose, Bld: 91 mg/dL (ref 65–99)
Potassium: 4.1 mmol/L (ref 3.5–5.1)
SODIUM: 140 mmol/L (ref 135–145)

## 2015-11-18 LAB — URINE DRUG SCREEN, QUALITATIVE (ARMC ONLY)
AMPHETAMINES, UR SCREEN: NOT DETECTED
Barbiturates, Ur Screen: NOT DETECTED
Benzodiazepine, Ur Scrn: NOT DETECTED
CANNABINOID 50 NG, UR ~~LOC~~: NOT DETECTED
COCAINE METABOLITE, UR ~~LOC~~: NOT DETECTED
MDMA (ECSTASY) UR SCREEN: NOT DETECTED
Methadone Scn, Ur: NOT DETECTED
Opiate, Ur Screen: NOT DETECTED
PHENCYCLIDINE (PCP) UR S: NOT DETECTED
Tricyclic, Ur Screen: NOT DETECTED

## 2015-11-18 LAB — GLUCOSE, CAPILLARY
GLUCOSE-CAPILLARY: 93 mg/dL (ref 65–99)
Glucose-Capillary: 138 mg/dL — ABNORMAL HIGH (ref 65–99)

## 2015-11-18 LAB — CBC
HCT: 29.2 % — ABNORMAL LOW (ref 35.0–47.0)
Hemoglobin: 9.3 g/dL — ABNORMAL LOW (ref 12.0–16.0)
MCH: 27.5 pg (ref 26.0–34.0)
MCHC: 31.8 g/dL — ABNORMAL LOW (ref 32.0–36.0)
MCV: 86.3 fL (ref 80.0–100.0)
Platelets: 82 10*3/uL — ABNORMAL LOW (ref 150–440)
RBC: 3.38 MIL/uL — AB (ref 3.80–5.20)
RDW: 18.8 % — AB (ref 11.5–14.5)
WBC: 4.6 10*3/uL (ref 3.6–11.0)

## 2015-11-18 LAB — TROPONIN I: TROPONIN I: 0.14 ng/mL — AB (ref <0.031)

## 2015-11-18 MED ORDER — SODIUM CHLORIDE 0.9 % IV SOLN
INTRAVENOUS | Status: DC
  Administered 2015-11-18: 11:00:00 via INTRAVENOUS

## 2015-11-18 MED ORDER — SODIUM CHLORIDE 0.9 % IV BOLUS (SEPSIS)
500.0000 mL | Freq: Once | INTRAVENOUS | Status: AC
  Administered 2015-11-18: 500 mL via INTRAVENOUS

## 2015-11-18 NOTE — Progress Notes (Signed)
[facility]  ROUNDING NOTE   Subjective:  Patient well-known to us as we follow her for peritoneal dialysis as an outpatient. Presented withlightheadedness and dizziness which apparently led toa syncopal episode. She then reported when she came to she developed left-sided weakness. She had an MRI of the brain which was negative for an acute stroke. She still appears to be a bit weak on the left side as compared to the right.   Objective:  Vital signs in last 24 hours:  Temp:  [97.4 F (36.3 C)-97.8 F (36.6 C)] 97.4 F (36.3 C) (12/04 0800) Pulse Rate:  [71-88] 71 (12/04 1100) Resp:  [14-16] 14 (12/04 1100) BP: (103-129)/(75-91) 111/75 mmHg (12/04 1100) SpO2:  [96 %-100 %] 100 % (12/04 1100)  Weight change:  Filed Weights   11/17/15 1125  Weight: 49.896 kg (110 lb)    Intake/Output: I/O last 3 completed shifts: In: -  Out: 50 [Urine:50]   Intake/Output this shift:     Physical Exam: General: NAD, sitting up in bed  Head: Normocephalic, atraumatic. Moist oral mucosal membranes  Eyes: Anicteric  Neck: Supple, trachea midline  Lungs:  Clear to auscultation normal effort  Heart: Regular rate and rhythm no rubs  Abdomen:  Soft, nontender, BS present  Extremities: no peripheral edema.  Neurologic: Awake, alert, follows commands.  Left sided weakness noted 4/5.  Skin: No lesions  Access: PD catheter in place    Basic Metabolic Panel:  Recent Labs Lab 11/17/15 1110 11/18/15 0422  NA 138 140  K 3.6 4.1  CL 98* 104  CO2 24 21*  GLUCOSE 109* 91  BUN 73* 83*  CREATININE 9.48* 9.92*  CALCIUM 8.4* 7.6*    Liver Function Tests: No results for input(s): AST, ALT, ALKPHOS, BILITOT, PROT, ALBUMIN in the last 168 hours. No results for input(s): LIPASE, AMYLASE in the last 168 hours. No results for input(s): AMMONIA in the last 168 hours.  CBC:  Recent Labs Lab 11/17/15 1110 11/17/15 1319 11/18/15 0422  WBC 5.7 6.6 4.6  HGB 10.5* 9.7* 9.3*   HCT 32.3* 30.5* 29.2*  MCV 86.0 85.7 86.3  PLT 79* 76* 82*    Cardiac Enzymes:  Recent Labs Lab 11/17/15 1110  TROPONINI 0.14*    BNP: Invalid input(s): POCBNP  CBG:  Recent Labs Lab 11/17/15 1259 11/18/15 0858 11/18/15 1138  GLUCAP 89 138* 93    Microbiology: Results for orders placed or performed during the hospital encounter of 10/11/15  Surgical pcr screen     Status: None   Collection Time: 10/11/15  7:33 PM  Result Value Ref Range Status   MRSA, PCR NEGATIVE NEGATIVE Final   Staphylococcus aureus NEGATIVE NEGATIVE Final    Comment:        The Xpert SA Assay (FDA approved for NASAL specimens in patients over 52 years of age), is one component of a comprehensive surveillance program.  Test performance has been validated by [facility] for patients greater than or equal to 52 year old. It is not intended to diagnose infection nor to guide or monitor treatment.     Coagulation Studies:  Recent Labs  11/17/15 1110  LABPROT 14.7  INR 1.13    Urinalysis:  Recent Labs  11/18/15 1122  COLORURINE YELLOW*  LABSPEC 1.015  PHURINE 5.0  GLUCOSEU NEGATIVE  HGBUR 1+*  BILIRUBINUR NEGATIVE  KETONESUR NEGATIVE  PROTEINUR 100*  NITRITE NEGATIVE  LEUKOCYTESUR 2+*      Imaging: Dg Chest 2 View  11/17/2015  CLINICAL DATA:  Chest pain EXAM: CHEST  2 VIEW COMPARISON:  08/21/2015 FINDINGS: Chronic cardiopericardial enlargement. There is no edema, consolidation, effusion, or pneumothorax. Small pneumoperitoneum, presumably from patient's peritoneal dialysis. There is no indication of abdominal pain. IMPRESSION: 1. No evidence of acute cardiopulmonary disease. 2. Pneumoperitoneum, presumably from patient's peritoneal dialysis access given no history of abdominal pain. 3. Chronic cardiomegaly. Electronically Signed   By: Marnee Spring M.D.   On: 11/17/2015 12:11   Ct Head Wo Contrast  11/17/2015  CLINICAL DATA:  Syncopal episode cooking in the kitchen.  EXAM: CT HEAD WITHOUT CONTRAST TECHNIQUE: Contiguous axial images were obtained from the base of the skull through the vertex without intravenous contrast. COMPARISON:  None available for review. FINDINGS: Skull and Sinuses:Negative for fracture or destructive process. The visualized mastoids, middle ears, and imaged paranasal sinuses are clear. Few gas bubbles along the right vertebral artery and in the bilateral cavernous sinus region consistent with intravenous gas from IV access. Orbits: No acute abnormality. Brain: No evidence of acute infarction, hemorrhage, hydrocephalus, or mass lesion/mass effect. Volume averaging versus small cortical and subcortical gliosis in the high right frontal lobe, lateral compatible with a remote small vessel infarct. There are bilateral sulcal calcifications which are likely sequela of infection or inflammation. Atherosclerotic calcifications is much less likely given size and location. These results were called by telephone at the time of interpretation on 11/17/2015 at 12:32 pm to Dr. Jene Every , who verbally acknowledged these results. IMPRESSION: No acute finding. Electronically Signed   By: Marnee Spring M.D.   On: 11/17/2015 12:35   Mr Brain Wo Contrast  11/17/2015  CLINICAL DATA:  Patient became lethargic after house chores. Lethargy and confusion and dizziness. Syncopal episode. End-stage renal disease. EXAM: MRI HEAD WITHOUT CONTRAST TECHNIQUE: Multiplanar, multiecho pulse sequences of the brain and surrounding structures were obtained without intravenous contrast. COMPARISON:  CT head earlier today. FINDINGS: No evidence for acute infarction, hemorrhage, mass lesion, hydrocephalus, or extra-axial fluid. Mild cerebral and cerebellar atrophy, premature for age. Slight prominence of perivascular spaces, nonspecific, can be seen in chronic hypertension. 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 Paucity of white matter disease elsewhere.  Diminished caliber of the LEFT internal carotid artery compared to the RIGHT in its petrous, cavernous, and supraclinoid segments, possibly also in the high cervical region. Extracranial stenosis or dissection could have this appearance. Symmetric and equal vertebral flow voids contribute to normal-appearing basilar artery. Normal midline structures.  No tonsillar herniation. Negative orbits, sinuses, and mastoids. RIGHT middle turbinate concha bullosa. No definite osseous findings. IMPRESSION: No acute intracranial findings.  Specifically no acute stroke. 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 Apparent diminished caliber of the LEFT internal carotid artery through much of its visualized course. A proximal extracranial stenosis or dissection is not excluded. Recommend CT angiography of the head and neck for further evaluation to evaluate the LEFT carotid system. Electronically Signed   By: Elsie Stain M.D.   On: 11/17/2015 21:02     Medications:   . sodium chloride 75 mL/hr at 11/18/15 1113   . aspirin EC  325 mg Oral Daily  . calcium acetate  2,001 mg Oral TID WC  . cinacalcet  90 mg Oral QHS  . conjugated estrogens  1 Applicatorful Vaginal Daily  . diphenhydrAMINE  25 mg Oral QODAY  . Ferric Citrate  3 tablet Oral TID  . fluticasone  2 spray Each Nare Daily  .  furosemide  40 mg Oral Daily  . metoprolol succinate  25 mg Oral Daily  . potassium chloride  20 mEq Oral Daily  . sevelamer carbonate  1,600 mg Oral TID WC  . sevelamer carbonate  800 mg Oral With snacks  . sodium chloride  3 mL Intravenous Q12H  . tiotropium  18 mcg Inhalation Daily   acetaminophen **OR** acetaminophen, albuterol, ALPRAZolam, cyclobenzaprine, hydrocerin, hydrOXYzine, [DISCONTINUED] ondansetron **OR** ondansetron (ZOFRAN) IV, ondansetron  Assessment/ Plan:  54 y.o. female with PMHx of of hypertension, hyperlipidemia, vitamin D deficiency, tobacco  abuse, edema, cholecystectomy,hysterectomy, ESRD secondary to FSGS, SHPTH, severe mitral stenosis,  Liver cirrhosis, inguinal hernia repair.  1.  ESRD on PD:  Patient is well-known to [place].  We follow her for peritoneal dialysis.  She was lightheaded and dizzy after her peritoneal dialysis yesterday.  This may have contributed to her presentation.  We will proceed with peritoneal dialysis tonight and use only 1.5% dextrose solution.  She was using 2.5% dextrose solution at home and does not appear to have any significant volume on her at this time.  2.  Anemia chronic kidney disease.  Hemoglobin currently 9.3.  Patient is on Epogen as an outpatient.  We will likely continue this as an outpatient.  3.  Secondary hyperparathyroidism.  The patient has had rather difficult to control secondary hyperparathyroidism as an outpatient.  For now continue Sensipar as well as of Renvela.  4.  Dizziness/lightheadedness/syncopal episode/left-sided weakness.  The symptoms were concerning for an underlying CVA.  MRI of the brain was negative or acute CVA.  However she was found to have diminished caliber of the left internal carotid artery.  We will obtain ultrasound f both carotid arteries.   LOS: 1 Anjolie Majer 12/4/20163:07 PM

## 2015-11-18 NOTE — Care Management Important Message (Signed)
Important Message  Patient Details  Name: Dorothy Long MRN: [id number] Date of Birth: Oct 21, 1961   Medicare Important Message Given:  Yes    Isiac Breighner A, RN 11/18/2015, 5:40 PM

## 2015-11-18 NOTE — Progress Notes (Signed)
Patient very lethargic. Patient stated, "I don't feel right", Notified Dr. Clent RidgesWalsh of current Vitals. Per MD she will order bolus fluids.   Filed Vitals:   11/18/15 0429 11/18/15 0800  BP: 124/88 103/79  Pulse: 88 84  Temp: 97.8 F (36.6 C) 97.4 F (36.3 C)  Resp: 16 16

## 2015-11-18 NOTE — Evaluation (Signed)
Physical Therapy Evaluation Patient Details Name: Dorothy Long MRN: [id number] DOB: 06/30/1961 Today's Date: 11/18/2015   History of Present Illness  Pt is here with L sided weakness and altered mental status  Clinical Impression  Pt with L sided weakness and coordination issues though she is able to use a walker effectively and actually is fairly safe ambulating with it.  She has some confusion/AMS and needs cuing and guidance to remain on task.  Pt has supportive family and should be able to return home safely with HHPT.    Follow Up Recommendations Home health PT    Equipment Recommendations  Rolling walker with 5" wheels    Recommendations for Other Services       Precautions / Restrictions Precautions Precautions: Fall Restrictions Weight Bearing Restrictions: No      Mobility  Bed Mobility Overal bed mobility: Modified Independent             General bed mobility comments: Pt needing extra time and cuing, but does not need direct assist  Transfers Overall transfer level: Modified independent Equipment used: Rolling walker (2 wheeled)             General transfer comment: Pt needing cuing to insure she has good hold on the walker with L hand and generally to insure safe decision making   Ambulation/Gait Ambulation/Gait assistance: Supervision;Min guard Ambulation Distance (Feet): 60 Feet Assistive device: Rolling walker (2 wheeled)       General Gait Details: Pt with slow deliberate cadence, but appears safe and has no LOBs or mis-steps during ambulation into hallway.  Pt shows reasonable safety does not have buckling in the L LE or loss of grip with L hand  Stairs            Wheelchair Mobility    Modified Rankin (Stroke Patients Only)       Balance                                             Pertinent Vitals/Pain Pain Assessment: No/denies pain    Home Living Family/patient expects to be discharged to::  Private residence Living Arrangements: Spouse/significant other     Home Access: Stairs to enter Entrance Stairs-Rails: None Entrance Stairs-Number of Steps: 4          Prior Function Level of Independence: Independent               Hand Dominance        Extremity/Trunk Assessment   Upper Extremity Assessment: LUE deficits/detail       LUE Deficits / Details: grossly 3/5 in availabe range, displays decreased coordination and quality of motion   Lower Extremity Assessment: LLE deficits/detail   LLE Deficits / Details: grossly 3/5 strength with decreased sensation and quality of motion     Communication   Communication: Receptive difficulties (unsure how this compares to baseline)  Cognition Arousal/Alertness: Awake/alert Behavior During Therapy: Impulsive;Restless Overall Cognitive Status: Impaired/Different from baseline (family reports she is not her self, seems confused) Area of Impairment: Safety/judgement;Awareness                    General Comments      Exercises        Assessment/Plan    PT Assessment Patient needs continued PT services  PT Diagnosis Difficulty walking;Generalized weakness;Hemiplegia non-dominant side   PT Problem  List Decreased strength;Decreased range of motion;Decreased activity tolerance;Decreased balance;Decreased mobility;Decreased coordination;Decreased cognition;Decreased knowledge of use of DME;Decreased safety awareness  PT Treatment Interventions Gait training;DME instruction;Stair training;Functional mobility training;Therapeutic activities;Therapeutic exercise;Balance training;Neuromuscular re-education;Cognitive remediation;Patient/family education   PT Goals (Current goals can be found in the Care Plan section) Acute Rehab PT Goals Patient Stated Goal: "I want to go home" PT Goal Formulation: With patient/family Time For Goal Achievement: 12/02/15 Potential to Achieve Goals: Fair    Frequency 7X/week    Barriers to discharge        Co-evaluation               End of Session Equipment Utilized During Treatment: Gait belt Activity Tolerance: Patient tolerated treatment well Patient left: with bed alarm set           Time: 1443-1500 PT Time Calculation (min) (ACUTE ONLY): 17 min   Charges:   PT Evaluation $Initial PT Evaluation Tier I: 1 Procedure     PT G Codes:       Loran SentersGalen Luster Hechler, PT, DPT [phone]  Malachi ProGalen R Kewanna Kasprzak 11/18/2015, 5:55 PM

## 2015-11-18 NOTE — Plan of Care (Signed)
Problem: Safety: Goal: Ability to remain free from injury will improve Outcome: Progressing Patient is refusing bed alarm   

## 2015-11-18 NOTE — Progress Notes (Signed)
Patient refused bed alarm. Patient has been educated on safety prevention.

## 2015-11-18 NOTE — Progress Notes (Signed)
*  PRELIMINARY RESULTS* Echocardiogram 2D Echocardiogram has been performed.  Dorothy Long 11/18/2015, 10:42 AM

## 2015-11-18 NOTE — Progress Notes (Signed)
[facility] [place] [place] [facility]   PATIENT NAME: Dorothy Long    MR#:  [id number]  DATE OF BIRTH:  05-Oct-1961  SUBJECTIVE:  CHIEF COMPLAINT:   Chief Complaint  Patient presents with  . Loss of Consciousness  . Chest Pain   Groggy since receiving Ativan for MRI. Continued left-sided weakness.  REVIEW OF SYSTEMS:   Review of Systems  Constitutional: Positive for malaise/fatigue. Negative for fever.  Respiratory: Negative for shortness of breath.   Cardiovascular: Negative for chest pain and palpitations.  Gastrointestinal: Negative for nausea, vomiting and abdominal pain.  Genitourinary: Negative for dysuria.  Neurological: Positive for focal weakness.    DRUG ALLERGIES:   Allergies  Allergen Reactions  . Ciprocinonide [Fluocinolone] Hives  . Demerol [Meperidine] Nausea And Vomiting  . Diflucan [Fluconazole] Nausea Only  . Flagyl [Metronidazole] Hives and Itching  . Hydrocodone Itching  . Levaquin [Levofloxacin In D5w] Other (See Comments)    Numbness in legs  . Morphine And Related Nausea And Vomiting  . Tetracyclines & Related Itching and Swelling  . Valium [Diazepam] Nausea Only    hallucinations    VITALS:  Blood pressure 111/75, pulse 71, temperature 97.4 F (36.3 C), temperature source Oral, resp. rate 14, height  (1.753 m), weight 49.896 kg (110 lb), SpO2 100 %.  PHYSICAL EXAMINATION:  GENERAL:  54 y.o.-year-old patient standing beside the bed, no distress LUNGS: Normal breath sounds bilaterally, no wheezing, rales,rhonchi or crepitation. No use of accessory muscles of respiration.  CARDIOVASCULAR: S1, S2 normal. No murmurs, rubs, or gallops.  ABDOMEN: Soft, nontender, nondistended. Bowel sounds present. No organomegaly or mass. No guarding no rebound. Peritoneal dialysis port is in place no surrounding erythema or exudate EXTREMITIES: No pedal edema, cyanosis, or clubbing. Pulses 2+ NEUROLOGIC: Cranial nerves II through  XII are intact. Muscle strength 5/5 in all extremities, slightly diminished on the left. Sensation intact. Gait normal PSYCHIATRIC: The patient is alert and oriented x 3.  SKIN: No obvious rash, lesion, or ulcer.    LABORATORY PANEL:   CBC  Recent Labs Lab 11/18/15 0422  WBC 4.6  HGB 9.3*  HCT 29.2*  PLT 82*   ------------------------------------------------------------------------------------------------------------------  Chemistries   Recent Labs Lab 11/18/15 0422  NA 140  K 4.1  CL 104  CO2 21*  GLUCOSE 91  BUN 83*  CREATININE 9.92*  CALCIUM 7.6*   ------------------------------------------------------------------------------------------------------------------  Cardiac Enzymes  Recent Labs Lab 11/17/15 1110  TROPONINI 0.14*   ------------------------------------------------------------------------------------------------------------------  RADIOLOGY:  Dg Chest 2 View  11/17/2015  CLINICAL DATA:  Chest pain EXAM: CHEST  2 VIEW COMPARISON:  08/21/2015 FINDINGS: Chronic cardiopericardial enlargement. There is no edema, consolidation, effusion, or pneumothorax. Small pneumoperitoneum, presumably from patient's peritoneal dialysis. There is no indication of abdominal pain. IMPRESSION: 1. No evidence of acute cardiopulmonary disease. 2. Pneumoperitoneum, presumably from patient's peritoneal dialysis access given no history of abdominal pain. 3. Chronic cardiomegaly. Electronically Signed   By: Marnee Spring M.D.   On: 11/17/2015 12:11   Ct Head Wo Contrast  11/17/2015  CLINICAL DATA:  Syncopal episode cooking in the kitchen. EXAM: CT HEAD WITHOUT CONTRAST TECHNIQUE: Contiguous axial images were obtained from the base of the skull through the vertex without intravenous contrast. COMPARISON:  None available for review. FINDINGS: Skull and Sinuses:Negative for fracture or destructive process. The visualized mastoids, middle ears, and imaged paranasal sinuses are clear.  Few gas bubbles along the right vertebral artery and in the bilateral cavernous sinus region consistent with  intravenous gas from IV access. Orbits: No acute abnormality. Brain: No evidence of acute infarction, hemorrhage, hydrocephalus, or mass lesion/mass effect. Volume averaging versus small cortical and subcortical gliosis in the high right frontal lobe, lateral compatible with a remote small vessel infarct. There are bilateral sulcal calcifications which are likely sequela of infection or inflammation. Atherosclerotic calcifications is much less likely given size and location. These results were called by telephone at the time of interpretation on 11/17/2015 at 12:32 pm to Dr. Jene EveryOBERT KINNER , who verbally acknowledged these results. IMPRESSION: No acute finding. Electronically Signed   By: Marnee SpringJonathon  Watts M.D.   On: 11/17/2015 12:35   Mr Brain Wo Contrast  11/17/2015  CLINICAL DATA:  Patient became lethargic after house chores. Lethargy and confusion and dizziness. Syncopal episode. End-stage renal disease. EXAM: MRI HEAD WITHOUT CONTRAST TECHNIQUE: Multiplanar, multiecho pulse sequences of the brain and surrounding structures were obtained without intravenous contrast. COMPARISON:  CT head earlier today. FINDINGS: No evidence for acute infarction, hemorrhage, mass lesion, hydrocephalus, or extra-axial fluid. Mild cerebral and cerebellar atrophy, premature for age. Slight prominence of perivascular spaces, nonspecific, can be seen in chronic hypertension. 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 Paucity of white matter disease elsewhere. Diminished caliber of the LEFT internal carotid artery compared to the RIGHT in its petrous, cavernous, and supraclinoid segments, possibly also in the high cervical region. Extracranial stenosis or dissection could have this appearance. Symmetric and equal vertebral flow voids contribute to normal-appearing basilar artery. Normal midline  structures.  No tonsillar herniation. Negative orbits, sinuses, and mastoids. RIGHT middle turbinate concha bullosa. No definite osseous findings. IMPRESSION: No acute intracranial findings.  Specifically no acute stroke. 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 Apparent diminished caliber of the LEFT internal carotid artery through much of its visualized course. A proximal extracranial stenosis or dissection is not excluded. Recommend CT angiography of the head and neck for further evaluation to evaluate the LEFT carotid system. Electronically Signed   By: Elsie StainJohn T Curnes M.D.   On: 11/17/2015 21:02   [place] Carotid Bilateral  11/18/2015  CLINICAL DATA:  Cerebral infarction. EXAM: BILATERAL CAROTID DUPLEX ULTRASOUND TECHNIQUE: Wallace CullensGray scale imaging, color Doppler and duplex ultrasound were performed of bilateral carotid and vertebral arteries in the neck. COMPARISON:  None. FINDINGS: Criteria: Quantification of carotid stenosis is based on velocity parameters that correlate the residual internal carotid diameter with NASCET-based stenosis levels, using the diameter of the distal internal carotid lumen as the denominator for stenosis measurement. The following velocity measurements were obtained: RIGHT ICA:  54/19 cm/sec CCA:  91/28 cm/sec SYSTOLIC ICA/CCA RATIO:  0.6 DIASTOLIC ICA/CCA RATIO:  0.7 ECA:  45 cm/sec LEFT ICA:  25/11 cm/sec CCA:  65/13 cm/sec SYSTOLIC ICA/CCA RATIO:  0.4 DIASTOLIC ICA/CCA RATIO:  0.8 ECA:  49 cm/sec RIGHT CAROTID ARTERY: There is a mild amount of calcified plaque at the level of the distal common carotid artery, carotid bulb and proximal ICA. Based on velocities estimated right ICA stenosis is less than 50%. RIGHT VERTEBRAL ARTERY: Antegrade flow with normal waveform and velocity. LEFT CAROTID ARTERY: Moderate focal calcified plaque present at the level of the distal bulb and proximal ICA. Based on turbulent flow focally present in  the plaque, there may be a component of plaque ulceration. Based on normal velocities, estimated left ICA stenosis is less than 50%. LEFT VERTEBRAL ARTERY: Antegrade flow with normal waveform and velocity. IMPRESSION: No significant carotid stenosis identified. Bilateral plaque  noted at the level of the bulbs and proximal internal carotid arteries, left greater than right. The proximal ICA plaque on the left may be partially ulcerated. Estimated bilateral ICA stenoses are less than 50%. Electronically Signed   By: Irish Lack M.D.   On: 11/18/2015 15:37    EKG:   Orders placed or performed during the hospital encounter of 11/17/15  . EKG 12-Lead  . EKG 12-Lead  . ED EKG within 10 minutes  . ED EKG within 10 minutes  . ED EKG  . ED EKG    ASSESSMENT AND PLAN:   1. Syncopal event with residual left-sided weakness - Neuro imaging is negative for acute CVA, does show remote right posterior frontal and subcortical infarct. Also showing diminished caliber of left internal carotid artery. - Echo report is pending - Carotid Doppler is pending - Physical therapy consultation pending  2. End-stage renal disease on peritoneal dialysis - Appreciate nephrology following, she will have PD tonight  #3 secondary hyperparathyroidism-continue Renvela, Sensipar.  #4 anxiety-continue Xanax.  #5 COPD-no acute exacerbation. Continue Spiriva.  All the records are reviewed and case discussed with Care Management/Social Workerr. Management plans discussed with the patient, family and they are in agreement.  CODE STATUS: full  TOTAL TIME TAKING CARE OF THIS PATIENT: 35 minutes.  Greater than 50% of time spent in care coordination and counseling. POSSIBLE D/C IN 1 DAYS, DEPENDING ON CLINICAL CONDITION.   Elby Showers M.D on 11/18/2015 at 3:51 PM  Between 7am to 6pm - Pager - [phone]  After 6pm go to [email or web address] - password [email or web address] [facility]  [place] [facility]  Office   [phone]  CC: Primary care physician; Sherlene Shams, MD

## 2015-11-19 DIAGNOSIS — I059 Rheumatic mitral valve disease, unspecified: Secondary | ICD-10-CM

## 2015-11-19 DIAGNOSIS — R55 Syncope and collapse: Secondary | ICD-10-CM

## 2015-11-19 LAB — BASIC METABOLIC PANEL
Anion gap: 13 (ref 5–15)
BUN: 68 mg/dL — AB (ref 6–20)
CALCIUM: 6.9 mg/dL — AB (ref 8.9–10.3)
CHLORIDE: 106 mmol/L (ref 101–111)
CO2: 19 mmol/L — AB (ref 22–32)
CREATININE: 8.66 mg/dL — AB (ref 0.44–1.00)
GFR calc non Af Amer: 5 mL/min — ABNORMAL LOW (ref >60)
GFR, EST AFRICAN AMERICAN: 5 mL/min — AB (ref >60)
GLUCOSE: 80 mg/dL (ref 65–99)
Potassium: 3.8 mmol/L (ref 3.5–5.1)
Sodium: 138 mmol/L (ref 135–145)

## 2015-11-19 LAB — CBC
HEMATOCRIT: 28.9 % — AB (ref 35.0–47.0)
HEMOGLOBIN: 9.3 g/dL — AB (ref 12.0–16.0)
MCH: 28.2 pg (ref 26.0–34.0)
MCHC: 32.3 g/dL (ref 32.0–36.0)
MCV: 87.2 fL (ref 80.0–100.0)
Platelets: 99 10*3/uL — ABNORMAL LOW (ref 150–440)
RBC: 3.31 MIL/uL — ABNORMAL LOW (ref 3.80–5.20)
RDW: 19.9 % — AB (ref 11.5–14.5)
WBC: 5 10*3/uL (ref 3.6–11.0)

## 2015-11-19 MED ORDER — FERROUS SULFATE 325 (65 FE) MG PO TABS
325.0000 mg | ORAL_TABLET | Freq: Two times a day (BID) | ORAL | Status: DC
  Administered 2015-11-19 – 2015-11-20 (×2): 325 mg via ORAL
  Filled 2015-11-19 (×3): qty 1

## 2015-11-19 NOTE — Progress Notes (Signed)
Physical Therapy Treatment Patient Details Name: Dorothy Long MRN: [id number] DOB: 1961-01-07 Today's Date: 11/19/2015    History of Present Illness Pt is here with L sided weakness and altered mental status    PT Comments    Pt demonstrates progression with ambulation and tolerating stair climbing. Does not demonstrate impulsivity or decreased safety today. Pt does fatigue more easily in left lower extremity requiring standing rest break at rail and notes fatigue/tightness with stair climbing. Pt also requires rest breaks with exercises. Pt provided written instructions for bed and seated lower extremity exercises. Continue PT for progression of strength, endurance, ambulation and stair climbing quality to allow for a safe return home.   Follow Up Recommendations  Home health PT     Equipment Recommendations  Rolling walker with 5" wheels (to tolerate longer distances and optimal safety)    Recommendations for Other Services       Precautions / Restrictions Precautions Precautions: Fall Restrictions Weight Bearing Restrictions: No    Mobility  Bed Mobility               General bed mobility comments: Not tested; standing at counter/mirror initially  Transfers Overall transfer level: Modified independent Equipment used: None             General transfer comment: STS with use of UEs to/from chair   Ambulation/Gait Ambulation/Gait assistance: Min guard;Supervision Ambulation Distance (Feet): 180 Feet (with brief standing rest at railing mid way) Assistive device: None Gait Pattern/deviations: Step-through pattern;Decreased dorsiflexion - left;Staggering left;Staggering right (decreased foot clearance B; left subjectively more fatigued) Gait velocity: decreased Gait velocity interpretation: Below normal speed for age/gender General Gait Details: slow with decreased foot clearance.; mild staggering.   Stairs Stairs: Yes Stairs assistance: Min  guard Stair Management: One rail Left (ascending; rail on R descending) Number of Stairs: 10 General stair comments: cues for sequencing   Wheelchair Mobility    Modified Rankin (Stroke Patients Only)       Balance                                    Cognition Arousal/Alertness: Awake/alert Behavior During Therapy: WFL for tasks assessed/performed Overall Cognitive Status: Within Functional Limits for tasks assessed                      Exercises General Exercises - Lower Extremity Quad Sets: Other (comment) (Reviewed  in packet) Gluteal Sets: Other (comment) (reviewed in packet) Short Arc Quad: Other (comment) (reviewed in packet) Long Arc Quad: AROM;Strengthening;Both;20 reps;Seated (performed in 2 sets with 5 second hold) Hip ABduction/ADduction: AROM;Strengthening;Both;20 reps;Seated (performed in 2 sets) Straight Leg Raises: Other (comment) (reviewed in packet) Hip Flexion/Marching: AROM;Strengthening;Both;20 reps;Seated Toe Raises: AROM;Both;20 reps;Seated Heel Raises: AROM;Both;20 reps;Seated Other Exercises Other Exercises: Ham curl with mild resist B 2 sets of 10    General Comments        Pertinent Vitals/Pain Pain Assessment: No/denies pain    Home Living                      Prior Function            PT Goals (current goals can now be found in the care plan section) Progress towards PT goals: Progressing toward goals    Frequency  7X/week    PT Plan Current plan remains appropriate    Co-evaluation  End of Session Equipment Utilized During Treatment: Gait belt Activity Tolerance: Patient limited by fatigue;Patient tolerated treatment well Patient left: with family/visitor present (wished to sit in visitors chair; no alarm)     Time: 4540-98111502-1530 PT Time Calculation (min) (ACUTE ONLY): 28 min  Charges:  $Gait Training: 8-22 mins $Therapeutic Exercise: 8-22 mins                    G Codes:       Kristeen MissHeidi Elizabeth Bishop 11/19/2015, 4:09 PM

## 2015-11-19 NOTE — Progress Notes (Signed)
[facility] -  at [facility]   PATIENT NAME: Dorothy Long    MR#:  [id number]  DATE OF BIRTH:  [birth year]  SUBJECTIVE:  CHIEF COMPLAINT:   Chief Complaint  Patient presents with  . Loss of Consciousness  . Chest Pain   Much more alert this morning. Making phone calls. Up and walking around.  REVIEW OF SYSTEMS:   Review of Systems  Constitutional: Positive for malaise/fatigue. Negative for fever.  Respiratory: Negative for shortness of breath.   Cardiovascular: Negative for chest pain and palpitations.  Gastrointestinal: Negative for nausea, vomiting and abdominal pain.  Genitourinary: Negative for dysuria.  Neurological: Positive for focal weakness.    DRUG ALLERGIES:   Allergies  Allergen Reactions  . Ciprocinonide [Fluocinolone] Hives  . Demerol [Meperidine] Nausea And Vomiting  . Diflucan [Fluconazole] Nausea Only  . Flagyl [Metronidazole] Hives and Itching  . Hydrocodone Itching  . Levaquin [Levofloxacin In D5w] Other (See Comments)    Numbness in legs  . Morphine And Related Nausea And Vomiting  . Tetracyclines & Related Itching and Swelling  . Valium [Diazepam] Nausea Only    hallucinations    VITALS:  Blood pressure 110/85, pulse 79, temperature 98.3 F (36.8 C), temperature source Oral, resp. rate 16, height  (1.753 m), weight 52.118 kg (114 lb 14.4 oz), SpO2 98 %.  PHYSICAL EXAMINATION:  GENERAL:  54 y.o.-year-old patient sitting beside the bed, no distress LUNGS: Normal breath sounds bilaterally, no wheezing, rales,rhonchi or crepitation. No use of accessory muscles of respiration.  CARDIOVASCULAR: S1, S2 normal. No murmurs, rubs, or gallops.  ABDOMEN: Soft, nontender, nondistended. Bowel sounds present. No organomegaly or mass. No guarding no rebound. Peritoneal dialysis port is in place no surrounding erythema or exudate EXTREMITIES: No pedal edema, cyanosis, or clubbing. Pulses 2+ NEUROLOGIC: Cranial nerves  II through XII are intact. Muscle strength 5/5 in all extremities, slightly diminished on the left. Sensation intact. Gait normal PSYCHIATRIC: The patient is alert and oriented x 3.  SKIN: No obvious rash, lesion, or ulcer.    LABORATORY PANEL:   CBC  Recent Labs Lab 11/19/15 0333  WBC 5.0  HGB 9.3*  HCT 28.9*  PLT 99*   ------------------------------------------------------------------------------------------------------------------  Chemistries   Recent Labs Lab 11/19/15 0333  NA 138  K 3.8  CL 106  CO2 19*  GLUCOSE 80  BUN 68*  CREATININE 8.66*  CALCIUM 6.9*   ------------------------------------------------------------------------------------------------------------------  Cardiac Enzymes  Recent Labs Lab 11/17/15 1110  TROPONINI 0.14*   ------------------------------------------------------------------------------------------------------------------  RADIOLOGY:  Mr Brain Wo Contrast  11/17/2015  CLINICAL DATA:  Patient became lethargic after house chores. Lethargy and confusion and dizziness. Syncopal episode. End-stage renal disease. EXAM: MRI HEAD WITHOUT CONTRAST TECHNIQUE: Multiplanar, multiecho pulse sequences of the brain and surrounding structures were obtained without intravenous contrast. COMPARISON:  CT head earlier today. FINDINGS: No evidence for acute infarction, hemorrhage, mass lesion, hydrocephalus, or extra-axial fluid. Mild cerebral and cerebellar atrophy, premature for age. Slight prominence of perivascular spaces, nonspecific, can be seen in chronic hypertension. 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 Paucity of white matter disease elsewhere. Diminished caliber of the LEFT internal carotid artery compared to the RIGHT in its petrous, cavernous, and supraclinoid segments, possibly also in the high cervical region. Extracranial stenosis or dissection could have this appearance. Symmetric and equal vertebral flow voids  contribute to normal-appearing basilar artery. Normal midline structures.  No tonsillar herniation. Negative orbits, sinuses, and mastoids. RIGHT middle turbinate concha bullosa. No definite  osseous findings. IMPRESSION: No acute intracranial findings.  Specifically no acute stroke. 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 Apparent diminished caliber of the LEFT internal carotid artery through much of its visualized course. A proximal extracranial stenosis or dissection is not excluded. Recommend CT angiography of the head and neck for further evaluation to evaluate the LEFT carotid system. Electronically Signed   By: Elsie Stain M.D.   On: 11/17/2015 21:02   US Carotid Bilateral  11/18/2015  CLINICAL DATA:  Cerebral infarction. EXAM: BILATERAL CAROTID DUPLEX ULTRASOUND TECHNIQUE: Wallace Cullens scale imaging, color Doppler and duplex ultrasound were performed of bilateral carotid and vertebral arteries in the neck. COMPARISON:  None. FINDINGS: Criteria: Quantification of carotid stenosis is based on velocity parameters that correlate the residual internal carotid diameter with NASCET-based stenosis levels, using the diameter of the distal internal carotid lumen as the denominator for stenosis measurement. The following velocity measurements were obtained: RIGHT ICA:  54/19 cm/sec CCA:  91/28 cm/sec SYSTOLIC ICA/CCA RATIO:  0.6 DIASTOLIC ICA/CCA RATIO:  0.7 ECA:  45 cm/sec LEFT ICA:  25/11 cm/sec CCA:  65/13 cm/sec SYSTOLIC ICA/CCA RATIO:  0.4 DIASTOLIC ICA/CCA RATIO:  0.8 ECA:  49 cm/sec RIGHT CAROTID ARTERY: There is a mild amount of calcified plaque at the level of the distal common carotid artery, carotid bulb and proximal ICA. Based on velocities estimated right ICA stenosis is less than 50%. RIGHT VERTEBRAL ARTERY: Antegrade flow with normal waveform and velocity. LEFT CAROTID ARTERY: Moderate focal calcified plaque present at the level of the distal bulb  and proximal ICA. Based on turbulent flow focally present in the plaque, there may be a component of plaque ulceration. Based on normal velocities, estimated left ICA stenosis is less than 50%. LEFT VERTEBRAL ARTERY: Antegrade flow with normal waveform and velocity. IMPRESSION: No significant carotid stenosis identified. Bilateral plaque noted at the level of the bulbs and proximal internal carotid arteries, left greater than right. The proximal ICA plaque on the left may be partially ulcerated. Estimated bilateral ICA stenoses are less than 50%. Electronically Signed   By: Irish Lack M.D.   On: 11/18/2015 15:37    EKG:   Orders placed or performed during the hospital encounter of 11/17/15  . EKG 12-Lead  . EKG 12-Lead  . ED EKG within 10 minutes  . ED EKG within 10 minutes  . ED EKG  . ED EKG    ASSESSMENT AND PLAN:   1. Syncopal event with residual left-sided weakness - Neuro imaging is negative for acute CVA, does show remote right posterior frontal and subcortical infarct. Also showing diminished caliber of left internal carotid artery. - Echo report shows significant mitral valve stenosis and calcification, TEE is recommended - Carotid Doppler shows no significant stenosis - Physical therapy demonstrates decreased strength in the left upper and lower extremity. Recommendation is for home health physical therapy with rolling walker  2. End-stage renal disease on peritoneal dialysis - Appreciate nephrology following, continue with PD at night  #3 secondary hyperparathyroidism-continue Renvela, Sensipar.  #4 anxiety-continue Xanax when necessary  #5 COPD-no acute exacerbation. Continue Spiriva.  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 Management plans discussed with the patient, family and they are in agreement.  CODE STATUS: full  TOTAL TIME TAKING CARE OF THIS PATIENT: 35 minutes.  Greater than 50% of time spent in care coordination and counseling. POSSIBLE D/C IN 1 DAYS, DEPENDING ON CLINICAL CONDITION.   Elby ShowersWALSH, CATHERINE M.D on 11/19/2015 at 4:00 PM  Between 7am to 6pm - Pager - [phone]  After 6pm go to [email or web address] - password [email or web address] [facility]  [facility]  Office  [phone]  CC: Primary care physician; Sherlene ShamsULLO, TERESA L, MD

## 2015-11-19 NOTE — Progress Notes (Signed)
Notified Dr. Anne HahnWillis that patient complaining of pain in head and left arm and that she felt very weak. She stated she just didn't feel right. No new orders. Will continue to monitor.

## 2015-11-19 NOTE — Consult Note (Signed)
Cardiology Consultation Note  Patient ID: Dorothy Long, MRN: [id number], DOB/AGE: Mar 15, 1961 54 y.o. Admit date: 11/17/2015   Date of Consult: 11/19/2015 Primary Physician: Sherlene Shams, MD Primary Cardiologist: Dr. 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   Her first and only occurence of Afib occured while in the hospital on 09/21/2013 in the setting of acute diarrhea illness. CHADVASc does calulate out to be at least 4 (CHF, HTN, DM, female). Given this was a one-time event she has not been placed on long term, full dose anticoagulation. At that time in hospital echo showed EF 45-50%, diastolic dysfunction, biatrial enalargement, mild aortic regurgitation. Mitral leaflets were thickened and appeared to be rheumatically deformed. There was heavy mitral annular calcification. Mild mitral stenosis with a calcificed valve area of 1.6 cm^2 by pressure half-time equation and moderate, eccentric mitral regurgitation. Mild to moderate pulmonary HTN at 35 mm Hg. She underwent pharmacological stress test during that admission that showed no convincing evidence of pharmacologically induced myocardial ischemia. Apparent anterior apical defect that was present only on attenuation corrected images was favored to  reflect artifact due to overcorrection and may have been worse on stress imaging secondary to greater patient motion. A small area of ischemia was felt less likely but was difficult to entirely exclude. EF 55%.   Echo performed 05/04/2014 to evaluate her mitral valve showed EF 45-50%, borderline concentric LVH, moderately dilated left atrium, mildly dilated right atrium, small pericardial effusion, moderate mitral regurgitation. There was rheumatic deformity. Moderate thickeing and calficication of the anterior and posterior mitral valves. No gradient was recorded. The mitral stenosis appeared mild to moderate visually. Mild aortic sclerosis without stenosis. Mildly elevated PASP. Mild to moderate tricuspid regurgitation.   She has previously undergone renal biopsy that demonstrated focal segmental glomerulosclerosis at [facility] in the setting of her SCr going from 1.8 to 5 to 6. Ultrasound showed atrophic right kidney. Now ESRD on HD with concomitant PD. She has started classes for kidney transplant through [facility] and has started the transplant process at [facility].    She was admitted to [facility] from 6/30 to 7/2 for abdominal/chest pain. CT abdomen pelvis on 7/1 at that time showed small left inguinal hernia. Scan on 6/30 showed enlarged liver and possible cirrhosis, advised to follow up with GI. Troponin was mildly elevated and flat trending at 0.10-->0.11 x 2 in the setting of her ESRD. Lipase was elevated at 79 on 6/30. WBC unremarkable. Case was dicussed with vascular who felt her PD catheter was ok and she was discharged home. It was felt her elevated troponin was in the setting of her ESRD. In hospital follow up patient was having considerable N/V/D with associated early satiety.   She was admitted to [facility] for a second time from 7/8 to 7/10 for abdominal pain secondary left  inguinal hernia and nausea. Initially suspected as spontaneous bacterial peritonitis and treated with IV Rocephin. Fluid cultures were negative.  She responded well to Zofran.   She presented to the ED on 7/18, after last HD session being performed on 7/16, with complaints of lower extremity edema, SOB with exertion, and orthopnea. CXR consistent with fluid overload/mild CHF. Venous HTN and small effusions. Weight 119 pound (53.9 kg) goal dry weight 54.5 kg. Case was discussed with nephrology and she was secured an outpatient dialysis session for later in the day.   She followed up with Dr. Lemar Livings, MD on 7/21 for evaluation of her left inguinal hernia and ventral hernia. At that time she had continued SOB and abdominal pain. She underwent successful repair of the left indirect inguinal and ventral hernias on 07/13/2015. She was admitted to [facility] in late October for colitis.   Patient was cleaning her kitchen on 12/3 prior to leaving for church and suddenly felt very weak along the left side with associated slurred speech. She was able to get herself to the floor and crawl to her recliner and call her husband for assistance. She is uncertain if she suffered LOC or not. Her left-sided weakness and slurred speech lasted past her arrival to [facility], then began to resolve on their own. There was never any associated chest pain, tachy-palpitations, nausea, vomiting, or diaphoresis. There was some associated SOB. Her weight has been decreasing by about 3 pounds each PD session. She has not been experiencing LEE, PND, or orthopnea. She has never experienced symptoms similar.   Upon the patient's arrival to [facility] they were found to have troponin 0.14, K+ 3.6-->3.8, hgb 9.7-->9.3, UDS negative. ECG showed NSR, 87 bpm, incomplete RBBB, PVCs, poor R wave progression. CXR showed without acute cardiopulmonary disease. CT head with no acute findings. MRI without acute stroke. Carotid doppler without significant carotid stenosis bilaterally. Echo showed EF 40-45%, diffuse HK, GR2DD, high ventricular filling pressures, calcified aortic annulus, mild AI, severely restricted  mitral valve c/w moderate stenosis though by visual appearance the stenosis appeared to be severe, severely dilated LA, severely dilated RA, mild TR and PR, inferior vena cava was dilated.     Past Medical History  Diagnosis Date  . Hypertension   . Hyperlipidemia   . COPD (chronic obstructive pulmonary disease) (HCC)     a. not on home O2  . Anemia   . Valvular disease     a. 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 echo 2015: EF 45-50%, borderline LVH, mod dilated LA, mildly dilated RA, small pericardial effusion, mod MR (rheumatic deformity), MS mild-mod, no gradient, PASP ele  . Chronic combined systolic and diastolic CHF (congestive heart failure) (HCC)     a. echo reports above  . PAF (paroxysmal atrial fibrillation) (HCC)     a. in setting of diarrhea 09/21/2013; b. not on long term full dose anticoagulation; c. CHADSVASC at least 4 (CHF, HTN, DM, female)  . Diabetes mellitus (HCC)   . Peritoneal dialysis status (HCC) 4- 15-15  . Ventral hernia   . Inguinal hernia   . History of stress test     a. 2014: no convincing evidence of pharmacologically induced ischemia,  apparent ant apical defect present only on attenuation corrected images favored to reflect artifact due to overcorrection & may be worse  on stress imaging secondary to greater patient motion. A small area of ischemia is felt less likely but is difficult to entirely exclude, EF 55%  . ESRD (end stage renal disease) on dialysis (HCC)     a. HD on T,T,S; b. 2/2 focal segmental glomerulosclerosis   . Dialysis patient [facility])       Most Recent Cardiac Studies: Echo 11/18/2015: Study Conclusions  - Left ventricle: The cavity size was normal. There was mild hypertrophy of the septal wall. Systolic function was mildly to moderately reduced. The  estimated ejection fraction was in the range of 40% to 45%. Diffuse hypokinesis. Features are consistent with a pseudonormal left ventricular filling pattern, with concomitant abnormal relaxation and increased filling pressure (grade 2 diastolic dysfunction). Doppler parameters are consistent with high ventricular filling pressure. - Aortic valve: Moderately calcified annulus. Trileaflet; normal thickness leaflets. There was mild regurgitation. Valve area (Vmax): 1.62 cm^2. Regurgitation pressure half-time: 735 ms. - Mitral valve: Mobility of the anterior and posterior leaflet was severely restricted. The findings are consistent with moderate stenosis by calculations. Visually, stenosis appears severe. There was moderate regurgitation. Pressure half-time: 142 ms. Mean gradient (D): 13 mm Hg. Valve area by pressure half-time: 1.55 cm^2. - Left atrium: The atrium was severely dilated. - Right ventricle: The cavity size was normal. Wall thickness was normal. Systolic function was normal. - Right atrium: The atrium was severely dilated. - Tricuspid valve: There was mild regurgitation. - Pulmonic valve: There was mild regurgitation. - Inferior vena cava: The vessel was dilated. The respirophasic diameter changes were blunted (< 50%), consistent with elevated central venous pressure. Impressions: - Severely thickened, severely calcified leaflets, especially prominent in the anterior leaflet. Likely consistent with Rheumatic heart disease. Mitral valve flow only through what is likely the A1/P1 segment of the valve. Stenosis appears severe visually but calculations suggest moderate stenosis. Suggest TEE for further evaluation.  Echo 2015: EF 45-50%, mildly decreased global LV systolic function, borderline concentric LVH, moderately dilated LA, mildly dilated RA, small pericardial effusion, moderate MR with rheumatic deformity, moderate thickening and  calcification of the anterior and posterior mitral valve leaflets, no gradient was recorded across the mitral valve.   Echo 2014: EF 45-50%, mildly decreased global LV systolic function, mild to moderate increased LV internal cavity size, rheumatic mitral valve, severe mitral stenosis, severe thickening and calcification of the posterior mitral valve leaflet. Cannot exclude a vegetation. Mild AI. Mild aortic valve sclerosis without stenosis. Mild TR. Pulmonary pressure is likely under estimated.    Surgical History:  Past Surgical History  Procedure Laterality Date  . Abdominal hysterectomy    . Cholecystectomy    . Knee surgery Right   . Foot surgery Bilateral   . Av fistula placement Left 10-26-13    Dr Gilda Crease  . Peritoneal catheter insertion  03-29-14    Dr Gilda Crease  . Inguinal hernia repair Left 07/13/2015    Procedure: HERNIA REPAIR INGUINAL ADULT;  Surgeon: Earline Mayotte, MD;  Location: [facility] ORS;  Service: General;  Laterality: Left;  Marland Kitchen Ventral hernia repair N/A 07/13/2015    Procedure: HERNIA REPAIR VENTRAL ADULT;  Surgeon: Earline Mayotte, MD;  Location: [facility] ORS;  Service: General;  Laterality: N/A;     Home Meds: Prior to Admission medications   Medication Sig Start Date End Date Taking? Authorizing Provider  acetaminophen (TYLENOL) 325 MG tablet Take 325 mg by mouth every 4 (four) hours as needed for mild pain.    Yes Historical Provider, MD  AURYXIA 210 MG TABS Take 3 tablets by mouth 3 (three) times daily.   Yes Historical Provider, MD  Calcium Acetate 667 MG TABS Take 3 capsules by mouth 3 (three) times daily.   Yes Historical Provider, MD  cinacalcet (SENSIPAR) 90 MG tablet Take 90 mg by mouth at bedtime.    Yes Historical Provider, MD  furosemide (LASIX) 40 MG tablet Take 40-80 mg by mouth daily.   Yes Historical Provider, MD  metoprolol succinate (TOPROL-XL) 25 MG 24 hr tablet Take 25 mg by mouth daily.   Yes Historical Provider, MD  RENVELA 800 MG tablet Take 1,600  mg by mouth 3 (three) times daily with meals. And one tablet with snacks 06/17/15  Yes Historical Provider, MD  tiotropium (SPIRIVA) 18 MCG inhalation capsule Place 1 capsule (18 mcg total) into inhaler and inhale daily. 08/22/15  Yes Carollee Leitzarrie M Doss, NP  albuterol (VENTOLIN HFA) 108 (90 BASE) MCG/ACT inhaler Inhale 1-2 puffs into the lungs every 6 (six) hours as needed for wheezing or shortness of breath.     Historical Provider, MD  ALPRAZolam Prudy Feeler(XANAX) 0.25 MG tablet Take 1 tablet (0.25 mg total) by mouth 3 (three) times daily as needed for sleep. 06/16/15   Houston SirenVivek J Sainani, MD  diphenhydrAMINE (BENADRYL) 25 MG tablet Take 25 mg by mouth every other day. MON, WED, FRI BEFORE DIALYSIS    Historical Provider, MD  ondansetron (ZOFRAN ODT) 4 MG disintegrating tablet Take 1 tablet (4 mg total) by mouth every 8 (eight) hours as needed for nausea or vomiting. 10/12/15   Milagros LollSrikar Sudini, MD    Inpatient Medications:  . aspirin EC  325 mg Oral Daily  . calcium acetate  2,001 mg Oral TID WC  . cinacalcet  90 mg Oral QHS  . conjugated estrogens  1 Applicatorful Vaginal Daily  . diphenhydrAMINE  25 mg Oral QODAY  . Ferric Citrate  3 tablet Oral TID  . fluticasone  2 spray Each Nare Daily  . furosemide  40 mg Oral Daily  . metoprolol succinate  25 mg Oral Daily  . potassium chloride  20 mEq Oral Daily  . sevelamer carbonate  1,600 mg Oral TID WC  . sevelamer carbonate  800 mg Oral With snacks  . sodium chloride  3 mL Intravenous Q12H  . tiotropium  18 mcg Inhalation Daily      Allergies:  Allergies  Allergen Reactions  . Ciprocinonide [Fluocinolone] Hives  . Demerol [Meperidine] Nausea And Vomiting  . Diflucan [Fluconazole] Nausea Only  . Flagyl [Metronidazole] Hives and Itching  . Hydrocodone Itching  . Levaquin [Levofloxacin In D5w] Other (See Comments)    Numbness in legs  . Morphine And Related Nausea And Vomiting  . Tetracyclines & Related Itching and Swelling  . Valium [Diazepam] Nausea Only     hallucinations    Social History   Social History  . Marital Status: Married    Spouse Name: N/A  . Number of Children: N/A  . Years of Education: N/A   Occupational History  . Not on file.   Social History Main Topics  . Smoking status: Current Some Day Smoker -- 0.50 packs/day for 25 years    Types: Cigarettes  . Smokeless tobacco: Never Used  . Alcohol Use: No     Comment: occasional  . Drug Use: No  . Sexual Activity: No   Other Topics Concern  . Not on file   Social History Narrative     Family History  Problem  Relation Age of Onset  . Cancer Mother     colon  . Heart disease Father   . Hypertension Father      Review of Systems: Review of Systems  Constitutional: Positive for weight loss and malaise/fatigue. Negative for fever, chills and diaphoresis.  HENT: Negative for congestion.   Eyes: Negative for discharge and redness.  Respiratory: Negative for cough, hemoptysis, sputum production, shortness of breath and wheezing.   Cardiovascular: Negative for chest pain, palpitations, orthopnea, claudication, leg swelling and PND.  Gastrointestinal: Negative for heartburn, nausea, vomiting and abdominal pain.  Musculoskeletal: Negative for myalgias, falls and neck pain.  Skin: Negative for rash.  Neurological: Positive for sensory change, speech change, focal weakness and weakness. Negative for dizziness, tingling, tremors, seizures and headaches.       Patient is unsure if she suffered LOC  Endo/Heme/Allergies: Does not bruise/bleed easily.  Psychiatric/Behavioral: Positive for substance abuse. The patient is not nervous/anxious.        Ongoing tobacco abuse  All other systems reviewed and are negative.   Labs:  Recent Labs  11/17/15 1110  TROPONINI 0.14*   Lab Results  Component Value Date   WBC 5.0 11/19/2015   HGB 9.3* 11/19/2015   HCT 28.9* 11/19/2015   MCV 87.2 11/19/2015   PLT 99* 11/19/2015    Recent Labs Lab 11/19/15 0333  NA 138  K  3.8  CL 106  CO2 19*  BUN 68*  CREATININE 8.66*  CALCIUM 6.9*  GLUCOSE 80   Lab Results  Component Value Date   CHOL 120 05/03/2014   HDL 44 05/03/2014   LDLCALC 52 05/03/2014   TRIG 120 05/03/2014   No results found for: DDIMER  Radiology/Studies:  Dg Chest 2 View  11/17/2015  CLINICAL DATA:  Chest pain EXAM: CHEST  2 VIEW COMPARISON:  08/21/2015 FINDINGS: Chronic cardiopericardial enlargement. There is no edema, consolidation, effusion, or pneumothorax. Small pneumoperitoneum, presumably from patient's peritoneal dialysis. There is no indication of abdominal pain. IMPRESSION: 1. No evidence of acute cardiopulmonary disease. 2. Pneumoperitoneum, presumably from patient's peritoneal dialysis access given no history of abdominal pain. 3. Chronic cardiomegaly. Electronically Signed   By: Marnee Spring M.D.   On: 11/17/2015 12:11   Ct Head Wo Contrast  11/17/2015  CLINICAL DATA:  Syncopal episode cooking in the kitchen. EXAM: CT HEAD WITHOUT CONTRAST TECHNIQUE: Contiguous axial images were obtained from the base of the skull through the vertex without intravenous contrast. COMPARISON:  None available for review. FINDINGS: Skull and Sinuses:Negative for fracture or destructive process. The visualized mastoids, middle ears, and imaged paranasal sinuses are clear. Few gas bubbles along the right vertebral artery and in the bilateral cavernous sinus region consistent with intravenous gas from IV access. Orbits: No acute abnormality. Brain: No evidence of acute infarction, hemorrhage, hydrocephalus, or mass lesion/mass effect. Volume averaging versus small cortical and subcortical gliosis in the high right frontal lobe, lateral compatible with a remote small vessel infarct. There are bilateral sulcal calcifications which are likely sequela of infection or inflammation. Atherosclerotic calcifications is much less likely given size and location. These results were called by telephone at the time of  interpretation on 11/17/2015 at 12:32 pm to Dr. Jene Every , who verbally acknowledged these results. IMPRESSION: No acute finding. Electronically Signed   By: Marnee Spring M.D.   On: 11/17/2015 12:35   Mr Brain Wo Contrast  11/17/2015  CLINICAL DATA:  Patient became lethargic after house chores. Lethargy and confusion and dizziness. Syncopal  episode. End-stage renal disease. EXAM: MRI HEAD WITHOUT CONTRAST TECHNIQUE: Multiplanar, multiecho pulse sequences of the brain and surrounding structures were obtained without intravenous contrast. COMPARISON:  CT head earlier today. FINDINGS: No evidence for acute infarction, hemorrhage, mass lesion, hydrocephalus, or extra-axial fluid. Mild cerebral and cerebellar atrophy, premature for age. Slight prominence of perivascular spaces, nonspecific, can be seen in chronic hypertension. 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 Paucity of white matter disease elsewhere. Diminished caliber of the LEFT internal carotid artery compared to the RIGHT in its petrous, cavernous, and supraclinoid segments, possibly also in the high cervical region. Extracranial stenosis or dissection could have this appearance. Symmetric and equal vertebral flow voids contribute to normal-appearing basilar artery. Normal midline structures.  No tonsillar herniation. Negative orbits, sinuses, and mastoids. RIGHT middle turbinate concha bullosa. No definite osseous findings. IMPRESSION: No acute intracranial findings.  Specifically no acute stroke. 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 Apparent diminished caliber of the LEFT internal carotid artery through much of its visualized course. A proximal extracranial stenosis or dissection is not excluded. Recommend CT angiography of the head and neck for further evaluation to evaluate the LEFT carotid system. Electronically Signed   By: Elsie Stain M.D.   On:  11/17/2015 21:02   US Carotid Bilateral  11/18/2015  CLINICAL DATA:  Cerebral infarction. EXAM: BILATERAL CAROTID DUPLEX ULTRASOUND TECHNIQUE: Wallace Cullens scale imaging, color Doppler and duplex ultrasound were performed of bilateral carotid and vertebral arteries in the neck. COMPARISON:  None. FINDINGS: Criteria: Quantification of carotid stenosis is based on velocity parameters that correlate the residual internal carotid diameter with NASCET-based stenosis levels, using the diameter of the distal internal carotid lumen as the denominator for stenosis measurement. The following velocity measurements were obtained: RIGHT ICA:  54/19 cm/sec CCA:  91/28 cm/sec SYSTOLIC ICA/CCA RATIO:  0.6 DIASTOLIC ICA/CCA RATIO:  0.7 ECA:  45 cm/sec LEFT ICA:  25/11 cm/sec CCA:  65/13 cm/sec SYSTOLIC ICA/CCA RATIO:  0.4 DIASTOLIC ICA/CCA RATIO:  0.8 ECA:  49 cm/sec RIGHT CAROTID ARTERY: There is a mild amount of calcified plaque at the level of the distal common carotid artery, carotid bulb and proximal ICA. Based on velocities estimated right ICA stenosis is less than 50%. RIGHT VERTEBRAL ARTERY: Antegrade flow with normal waveform and velocity. LEFT CAROTID ARTERY: Moderate focal calcified plaque present at the level of the distal bulb and proximal ICA. Based on turbulent flow focally present in the plaque, there may be a component of plaque ulceration. Based on normal velocities, estimated left ICA stenosis is less than 50%. LEFT VERTEBRAL ARTERY: Antegrade flow with normal waveform and velocity. IMPRESSION: No significant carotid stenosis identified. Bilateral plaque noted at the level of the bulbs and proximal internal carotid arteries, left greater than right. The proximal ICA plaque on the left may be partially ulcerated. Estimated bilateral ICA stenoses are less than 50%. Electronically Signed   By: Irish Lack M.D.   On: 11/18/2015 15:37    EKG: NSR, 87 bpm, incomplete RBBB, PVCs, poor R wave  progression  Weights: Filed Weights   11/17/15 1125 11/19/15 0452  Weight: 110 lb (49.896 kg) 114 lb 14.4 oz (52.118 kg)     Physical Exam: Blood pressure 104/67, pulse 65, temperature 98.2 F (36.8 C), temperature source Oral, resp. rate 16, height 5\' 9"  (1.753 m), weight 114 lb 14.4 oz (52.118 kg), SpO2 98 %. Body mass index is 16.96 kg/(m^2). General: Well developed, well nourished, in  no acute distress. Head: Normocephalic, atraumatic, sclera non-icteric, no xanthomas, nares are without discharge.  Neck: Negative for carotid bruits. JVD not elevated. Lungs: Clear bilaterally to auscultation without wheezes, rales, or rhonchi. Breathing is unlabored. Heart: RRR with S1 S2. IV/VI diastolic murmur along apex. No rubs, or gallops appreciated. Abdomen: Soft, non-tender, non-distended with normoactive bowel sounds. No hepatomegaly. No rebound/guarding. No obvious abdominal masses. Msk:  Strength and tone appear normal for age. Extremities: No clubbing or cyanosis. No edema.  Distal pedal pulses are 2+ and equal bilaterally. Neuro: Alert and oriented X 3. No facial asymmetry. No focal deficit. Moves all extremities spontaneously. Psych:  Responds to questions appropriately with a normal affect.    Assessment and Plan:   1. 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 Presyncope with associated paresthesias: -Possible TIA -TEE as above -She has history of PAF not on long term full dose anticoagulation given it was an isolated incident no signs of Afib currently. Cannot rule subsequent episodes of Afib. Continue to monitor on telemetry.  -May need 30 day event monitor  3. PAF: -Currently in sinus rhythm  -CHADSVASc at least 4 (CHF, HTN, DM, female) -Continue Toprol XL 25 mg  4. Chronic combined systolic and diastolic CHF: -Does not appear to currently  be volume overloaded -Volume managed by dialysis -Toprol as above  5. ESRD: -Per renal  6. HTN: -Controlled -Continue current medications  7. Ongoing tobacco abuse: -Cessation advised    Elinor Dodge, PA-C Pager: [phone] 11/19/2015, 9:27 AM

## 2015-11-19 NOTE — Care Management Note (Signed)
Case Management Note  Patient Details  Name: Romilda GarretDonnella R Hesler MRN: [id number] Date of Birth: 1960/12/28  Subjective/Objective:  Ms [name] chose [facility] from a list of local home health providers to provide home health PT. Voice message left for Feliberto GottronJason Hinton for home health PT with [facility], and message left on phone of Will Anderson requesting delivery of a rolling walker today to room 234.                   Action/Plan:   Expected Discharge Date:                  Expected Discharge Plan:     In-House Referral:     Discharge planning Services     Post Acute Care Choice:    Choice offered to:     DME Arranged:    DME Agency:     HH Arranged:    HH Agency:     Status of Service:     Medicare Important Message Given:  Yes Date Medicare IM Given:    Medicare IM give by:    Date Additional Medicare IM Given:    Additional Medicare Important Message give by:     If discussed at Long Length of Stay Meetings, dates discussed:    Additional Comments:  Lilybeth Vien A, RN 11/19/2015, 8:31 AM

## 2015-11-19 NOTE — Progress Notes (Signed)
   11/19/15 1030  Clinical Encounter Type  Visited With Patient  Visit Type Initial  Consult/Referral To Chaplain  Spiritual Encounters  Spiritual Needs Emotional;Prayer  Stress Factors  Patient Stress Factors Health changes  Met w/patient during routine visitation. Patient appeared energetic and was preparing to walk for strength. Provided pastoral support & prayer. Chap. Mercer Peifer G. Dighton

## 2015-11-19 NOTE — Progress Notes (Signed)
[facility]  ROUNDING NOTE   Subjective:  Peritoneal dialysis overnight. UF of 586mL  Patient reports lightheadedness often including when driving.   Plan for cardiology to complete syncope work up before discharge.    Objective:  Vital signs in last 24 hours:  Temp:  [97.7 F (36.5 C)-98.2 F (36.8 C)] 98.2 F (36.8 C) (12/05 0452) Pulse Rate:  [65-71] 65 (12/05 0452) Resp:  [14-16] 16 (12/05 0452) BP: (104-117)/(67-89) 104/67 mmHg (12/05 0452) SpO2:  [98 %-100 %] 98 % (12/05 0452) Weight:  [52.118 kg (114 lb 14.4 oz)] 52.118 kg (114 lb 14.4 oz) (12/05 0452)  Weight change: 2.223 kg (4 lb 14.4 oz) Filed Weights   11/17/15 1125 11/19/15 0452  Weight: 49.896 kg (110 lb) 52.118 kg (114 lb 14.4 oz)    Intake/Output: I/O last 3 completed shifts: In: 508.8 [I.V.:508.8] Out: 50 [Urine:50]   Intake/Output this shift:  Total I/O In: 9000 [Other:9000] Out: [id number] [Other:10266]  Physical Exam: General: NAD, sitting up in bed  Head: Normocephalic, atraumatic. Moist oral mucosal membranes  Eyes: Anicteric  Neck: Supple, trachea midline  Lungs:  Clear to auscultation normal effort  Heart: Regular rate and rhythm no rubs  Abdomen:  Soft, nontender, BS present  Extremities: no peripheral edema.  Neurologic: Awake, alert, follows commands.  Left sided weakness noted 4/5.  Skin: No lesions  Access: PD catheter in place, dressings clean    Basic Metabolic Panel:  Recent Labs Lab 11/17/15 1110 11/18/15 0422 11/19/15 0333  NA 138 140 138  K 3.6 4.1 3.8  CL 98* 104 106  CO2 24 21* 19*  GLUCOSE 109* 91 80  BUN 73* 83* 68*  CREATININE 9.48* 9.92* 8.66*  CALCIUM 8.4* 7.6* 6.9*    Liver Function Tests: No results for input(s): AST, ALT, ALKPHOS, BILITOT, PROT, ALBUMIN in the last 168 hours. No results for input(s): LIPASE, AMYLASE in the last 168 hours. No results for input(s): AMMONIA in the last 168 hours.  CBC:  Recent Labs Lab 11/17/15 1110  11/17/15 1319 11/18/15 0422 11/19/15 0333  WBC 5.7 6.6 4.6 5.0  HGB 10.5* 9.7* 9.3* 9.3*  HCT 32.3* 30.5* 29.2* 28.9*  MCV 86.0 85.7 86.3 87.2  PLT 79* 76* 82* 99*    Cardiac Enzymes:  Recent Labs Lab 11/17/15 1110  TROPONINI 0.14*    BNP: Invalid input(s): POCBNP  CBG:  Recent Labs Lab 11/17/15 1259 11/18/15 0858 11/18/15 1138  GLUCAP 89 138* 93    Microbiology: Results for orders placed or performed during the hospital encounter of 10/11/15  Surgical pcr screen     Status: None   Collection Time: 10/11/15  7:33 PM  Result Value Ref Range Status   MRSA, PCR NEGATIVE NEGATIVE Final   Staphylococcus aureus NEGATIVE NEGATIVE Final    Comment:        The Xpert SA Assay (FDA approved for NASAL specimens in patients over 421 years of age), is one component of a comprehensive surveillance program.  Test performance has been validated by [facility] for patients greater than or equal to 52 year old. It is not intended to diagnose infection nor to guide or monitor treatment.     Coagulation Studies:  Recent Labs  11/17/15 1110  LABPROT 14.7  INR 1.13    Urinalysis:  Recent Labs  11/18/15 1122  COLORURINE YELLOW*  LABSPEC 1.015  PHURINE 5.0  GLUCOSEU NEGATIVE  HGBUR 1+*  BILIRUBINUR NEGATIVE  KETONESUR NEGATIVE  PROTEINUR 100*  NITRITE NEGATIVE  LEUKOCYTESUR 2+*      Imaging: Dg Chest 2 View  11/17/2015  CLINICAL DATA:  Chest pain EXAM: CHEST  2 VIEW COMPARISON:  08/21/2015 FINDINGS: Chronic cardiopericardial enlargement. There is no edema, consolidation, effusion, or pneumothorax. Small pneumoperitoneum, presumably from patient's peritoneal dialysis. There is no indication of abdominal pain. IMPRESSION: 1. No evidence of acute cardiopulmonary disease. 2. Pneumoperitoneum, presumably from patient's peritoneal dialysis access given no history of abdominal pain. 3. Chronic cardiomegaly. Electronically Signed   By: Marnee Spring M.D.   On:  11/17/2015 12:11   Ct Head Wo Contrast  11/17/2015  CLINICAL DATA:  Syncopal episode cooking in the kitchen. EXAM: CT HEAD WITHOUT CONTRAST TECHNIQUE: Contiguous axial images were obtained from the base of the skull through the vertex without intravenous contrast. COMPARISON:  None available for review. FINDINGS: Skull and Sinuses:Negative for fracture or destructive process. The visualized mastoids, middle ears, and imaged paranasal sinuses are clear. Few gas bubbles along the right vertebral artery and in the bilateral cavernous sinus region consistent with intravenous gas from IV access. Orbits: No acute abnormality. Brain: No evidence of acute infarction, hemorrhage, hydrocephalus, or mass lesion/mass effect. Volume averaging versus small cortical and subcortical gliosis in the high right frontal lobe, lateral compatible with a remote small vessel infarct. There are bilateral sulcal calcifications which are likely sequela of infection or inflammation. Atherosclerotic calcifications is much less likely given size and location. These results were called by telephone at the time of interpretation on 11/17/2015 at 12:32 pm to Dr. Jene Every , who verbally acknowledged these results. IMPRESSION: No acute finding. Electronically Signed   By: Marnee Spring M.D.   On: 11/17/2015 12:35   Mr Brain Wo Contrast  11/17/2015  CLINICAL DATA:  Patient became lethargic after house chores. Lethargy and confusion and dizziness. Syncopal episode. End-stage renal disease. EXAM: MRI HEAD WITHOUT CONTRAST TECHNIQUE: Multiplanar, multiecho pulse sequences of the brain and surrounding structures were obtained without intravenous contrast. COMPARISON:  CT head earlier today. FINDINGS: No evidence for acute infarction, hemorrhage, mass lesion, hydrocephalus, or extra-axial fluid. Mild cerebral and cerebellar atrophy, premature for age. Slight prominence of perivascular spaces, nonspecific, can be seen in chronic hypertension.  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 Paucity of white matter disease elsewhere. Diminished caliber of the LEFT internal carotid artery compared to the RIGHT in its petrous, cavernous, and supraclinoid segments, possibly also in the high cervical region. Extracranial stenosis or dissection could have this appearance. Symmetric and equal vertebral flow voids contribute to normal-appearing basilar artery. Normal midline structures.  No tonsillar herniation. Negative orbits, sinuses, and mastoids. RIGHT middle turbinate concha bullosa. No definite osseous findings. IMPRESSION: No acute intracranial findings.  Specifically no acute stroke. 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 Apparent diminished caliber of the LEFT internal carotid artery through much of its visualized course. A proximal extracranial stenosis or dissection is not excluded. Recommend CT angiography of the head and neck for further evaluation to evaluate the LEFT carotid system. Electronically Signed   By: Elsie Stain M.D.   On: 11/17/2015 21:02   US Carotid Bilateral  11/18/2015  CLINICAL DATA:  Cerebral infarction. EXAM: BILATERAL CAROTID DUPLEX ULTRASOUND TECHNIQUE: Wallace Cullens scale imaging, color Doppler and duplex ultrasound were performed of bilateral carotid and vertebral arteries in the neck. COMPARISON:  None. FINDINGS: Criteria: Quantification of carotid stenosis is based on velocity parameters that correlate the residual internal carotid diameter with NASCET-based stenosis levels, using  the diameter of the distal internal carotid lumen as the denominator for stenosis measurement. The following velocity measurements were obtained: RIGHT ICA:  54/19 cm/sec CCA:  91/28 cm/sec SYSTOLIC ICA/CCA RATIO:  0.6 DIASTOLIC ICA/CCA RATIO:  0.7 ECA:  45 cm/sec LEFT ICA:  25/11 cm/sec CCA:  65/13 cm/sec SYSTOLIC ICA/CCA RATIO:  0.4 DIASTOLIC ICA/CCA RATIO:  0.8 ECA:   49 cm/sec RIGHT CAROTID ARTERY: There is a mild amount of calcified plaque at the level of the distal common carotid artery, carotid bulb and proximal ICA. Based on velocities estimated right ICA stenosis is less than 50%. RIGHT VERTEBRAL ARTERY: Antegrade flow with normal waveform and velocity. LEFT CAROTID ARTERY: Moderate focal calcified plaque present at the level of the distal bulb and proximal ICA. Based on turbulent flow focally present in the plaque, there may be a component of plaque ulceration. Based on normal velocities, estimated left ICA stenosis is less than 50%. LEFT VERTEBRAL ARTERY: Antegrade flow with normal waveform and velocity. IMPRESSION: No significant carotid stenosis identified. Bilateral plaque noted at the level of the bulbs and proximal internal carotid arteries, left greater than right. The proximal ICA plaque on the left may be partially ulcerated. Estimated bilateral ICA stenoses are less than 50%. Electronically Signed   By: Irish Lack M.D.   On: 11/18/2015 15:37     Medications:     . aspirin EC  325 mg Oral Daily  . calcium acetate  2,001 mg Oral TID WC  . cinacalcet  90 mg Oral QHS  . conjugated estrogens  1 Applicatorful Vaginal Daily  . diphenhydrAMINE  25 mg Oral QODAY  . Ferric Citrate  3 tablet Oral TID  . fluticasone  2 spray Each Nare Daily  . furosemide  40 mg Oral Daily  . metoprolol succinate  25 mg Oral Daily  . potassium chloride  20 mEq Oral Daily  . sevelamer carbonate  1,600 mg Oral TID WC  . sevelamer carbonate  800 mg Oral With snacks  . sodium chloride  3 mL Intravenous Q12H  . tiotropium  18 mcg Inhalation Daily   acetaminophen **OR** acetaminophen, albuterol, ALPRAZolam, cyclobenzaprine, hydrocerin, hydrOXYzine, [DISCONTINUED] ondansetron **OR** ondansetron (ZOFRAN) IV, ondansetron  Assessment/ Plan:  54 y.o. female with PMHx of of hypertension, hyperlipidemia, vitamin D deficiency, tobacco abuse, edema,  cholecystectomy,hysterectomy, ESRD secondary to FSGS, SHPTH, severe mitral stenosis,  Liver cirrhosis, inguinal hernia repair.  [facility] PD [address]   1.  ESRD on PD: Tolerated peritoneal dialysis last night. UF of . She gets nightly dialysis.  - Continue dextrose 1.5%.  - Dialysis again for tonight.   - Continue potassium for hypokalemia due to dialysis.   2.  Anemia chronic kidney disease.  Hemoglobin currently 9.3.  Patient is on Epogen as an outpatient.  No indication for inpatient administration.   3.  Secondary hyperparathyroidism.  The patient has had rather difficult to control secondary hyperparathyroidism as an outpatient. - calcium acetate, Sensipar and Renvela.  4.  Hypertension: with syncopal episode and left sided weakness.   The symptoms were concerning for an underlying CVA.  MRI of the brain was negative or acute CVA.   - Appreciate cardiology input.  - blood pressure on the low side.  - furosemide and metoprolol.   LOS: 2 Tityana Pagan 12/5/201610:20 AM

## 2015-11-20 ENCOUNTER — Encounter
Admission: EM | Disposition: A | Discharge status: Home / Self Care | Payer: Self-pay | Source: Home / Self Care | Attending: Internal Medicine

## 2015-11-20 ENCOUNTER — Other Ambulatory Visit: Payer: Self-pay

## 2015-11-20 ENCOUNTER — Telehealth: Payer: Self-pay | Admitting: Internal Medicine

## 2015-11-20 DIAGNOSIS — N189 Chronic kidney disease, unspecified: Secondary | ICD-10-CM

## 2015-11-20 DIAGNOSIS — R55 Syncope and collapse: Secondary | ICD-10-CM | POA: Insufficient documentation

## 2015-11-20 DIAGNOSIS — I6522 Occlusion and stenosis of left carotid artery: Secondary | ICD-10-CM

## 2015-11-20 DIAGNOSIS — Z8673 Personal history of transient ischemic attack (TIA), and cerebral infarction without residual deficits: Secondary | ICD-10-CM | POA: Insufficient documentation

## 2015-11-20 DIAGNOSIS — I639 Cerebral infarction, unspecified: Secondary | ICD-10-CM

## 2015-11-20 DIAGNOSIS — I6529 Occlusion and stenosis of unspecified carotid artery: Secondary | ICD-10-CM | POA: Insufficient documentation

## 2015-11-20 DIAGNOSIS — D631 Anemia in chronic kidney disease: Secondary | ICD-10-CM

## 2015-11-20 DIAGNOSIS — I6310 Cerebral infarction due to embolism of unspecified precerebral artery: Secondary | ICD-10-CM

## 2015-11-20 DIAGNOSIS — I052 Rheumatic mitral stenosis with insufficiency: Secondary | ICD-10-CM

## 2015-11-20 SURGERY — ECHOCARDIOGRAM, TRANSESOPHAGEAL
Anesthesia: Moderate Sedation

## 2015-11-20 MED ORDER — ASPIRIN EC 81 MG PO TBEC: 81.0000 mg | DELAYED_RELEASE_TABLET | Freq: Every day | ORAL | Status: DC

## 2015-11-20 MED ORDER — POTASSIUM CHLORIDE CRYS ER 10 MEQ PO TBCR: 20.0000 meq | EXTENDED_RELEASE_TABLET | Freq: Every day | ORAL | Status: DC

## 2015-11-20 NOTE — Progress Notes (Signed)
Physical Therapy Treatment Patient Details Name: Dorothy Long MRN: [id number] DOB: 04-May-1961 Today's Date: 11/20/2015    History of Present Illness Pt is here with L sided weakness and altered mental status    PT Comments    Pt found ambulating in the halls with single point cane and visitor. Pt continues to walk with PT and able to carry a conversation with head turns without staggering or LOB. Pace continues slow with decreased left foot clearance, but no toe catching/tripping. Pt has questions regarding home health PT; all questions answered. Pt states she is to be discharged today to home. Pt did not wish to practice steps again today due to tightness/fatigue in left leg, but feels comfortable performing to get in the home. Pt did perform steps safely yesterday. Continue PT for improved ambulation quality and speed, stair climbing and strength especially of left lower extremity if not discharged home today for improved functional mobility for optimal safe return home.  Follow Up Recommendations  Home health PT     Equipment Recommendations  Cane    Recommendations for Other Services       Precautions / Restrictions Precautions Precautions: Fall Restrictions Weight Bearing Restrictions: No    Mobility  Bed Mobility               General bed mobility comments: Not tested; pt found walking in hallway  Transfers                 General transfer comment: Not tested; pt found walking in hallway  Ambulation/Gait Ambulation/Gait assistance: Modified independent (Device/Increase time) Ambulation Distance (Feet): 300 Feet (ambulates this far with PT; found ambulating and continuing) Assistive device: Straight cane Gait Pattern/deviations: Step-through pattern (decreased clearance L foot) Gait velocity: decreased Gait velocity interpretation: Below normal speed for age/gender General Gait Details: slow, decreased L foot clearance, but no LOB. Tolerates  carrying on a conversation with head turns without staggering or LOB   Stairs            Wheelchair Mobility    Modified Rankin (Stroke Patients Only)       Balance                                    Cognition Arousal/Alertness: Awake/alert Behavior During Therapy: WFL for tasks assessed/performed Overall Cognitive Status: Within Functional Limits for tasks assessed                      Exercises      General Comments        Pertinent Vitals/Pain Pain Assessment: No/denies pain    Home Living                      Prior Function            PT Goals (current goals can now be found in the care plan section) Progress towards PT goals: Progressing toward goals    Frequency  7X/week    PT Plan Current plan remains appropriate    Co-evaluation             End of Session   Activity Tolerance: Patient tolerated treatment well Patient left: Other (comment) (ambulating with visitor in the hall)     Time: [id number] PT Time Calculation (min) (ACUTE ONLY): 10 min  Charges:  $Gait Training: 8-22 mins  G Codes:      Kristeen Miss 11/20/2015, 10:59 AM

## 2015-11-20 NOTE — Progress Notes (Signed)
Spoke with christopher berge pa cardiologist regarding tee. Per cardiologist PA they will not do TEE today with do as outpatient

## 2015-11-20 NOTE — Discharge Instructions (Signed)
°  DIET:  Renal diet  DISCHARGE CONDITION:  Stable  ACTIVITY:  Activity as tolerated  OXYGEN:  Home Oxygen: No.   Oxygen Delivery: room air  DISCHARGE LOCATION:  Home with home health   If you experience worsening of your admission symptoms, develop shortness of breath, life threatening emergency, suicidal or homicidal thoughts you must seek medical attention immediately by calling 911 or calling your MD immediately  if symptoms less severe.  You Must read complete instructions/literature along with all the possible adverse reactions/side effects for all the Medicines you take and that have been prescribed to you. Take any new Medicines after you have completely understood and accpet all the possible adverse reactions/side effects.   Please note  You were cared for by a hospitalist during your hospital stay. If you have any questions about your discharge medications or the care you received while you were in the hospital after you are discharged, you can call the unit and asked to speak with the hospitalist on call if the hospitalist that took care of you is not available. Once you are discharged, your primary care physician will handle any further medical issues. Please note that NO REFILLS for any discharge medications will be authorized once you are discharged, as it is imperative that you return to your primary care physician (or establish a relationship with a primary care physician if you do not have one) for your aftercare needs so that they can reassess your need for medications and monitor your lab values.  [facility] WILL PROVIDED NURSE AND PHYSICAL THERAPY [phone]

## 2015-11-20 NOTE — Progress Notes (Signed)
Patient: Dorothy Long / Admit Date: 11/17/2015 / Date of Encounter: 11/20/2015, 1:46 PM   Subjective: Feels somewhat better today, amylase 80 with a cane, left leg weakness, left hand Reports this came on acutely in the setting of weakness Denies any tachycardia concerning for arrhythmia at the time  Review of Systems: ROS Constitutional: Positive for weight loss and malaise/fatigue. Negative for fever, chills and diaphoresis.  HENT: Negative for congestion.  Eyes: Negative for discharge and redness.  Respiratory: Negative for cough, hemoptysis, sputum production, shortness of breath and wheezing.  Cardiovascular: Negative for chest pain, palpitations, orthopnea, claudication, leg swelling and PND.  Gastrointestinal: Negative for heartburn, nausea, vomiting and abdominal pain.  Musculoskeletal: Negative for myalgias, falls and neck pain.  Skin: Negative for rash.  Neurological: Positive for sensory change, speech change, focal weakness and weakness. Left leg, left hand weakness. Negative for dizziness, tingling, tremors, seizures and headaches.  Endo/Heme/Allergies: Does not bruise/bleed easily.  Psychiatric/Behavioral: Positive for substance abuse. The patient is not nervous/anxious. tobacco abuse  All other systems reviewed and are negative.  Objective: Telemetry: normal sinus rhythm Physical Exam: Blood pressure 104/70, pulse 69, temperature 98.5 F (36.9 C), temperature source Oral, resp. rate 22, height  (1.753 m), weight 111 lb 11.2 oz (50.667 kg), SpO2 96 %. Body mass index is 16.49 kg/(m^2). General: Well developed, well nourished, in no acute distress. Head: Normocephalic, atraumatic, sclera non-icteric, no xanthomas, nares are without discharge. Neck: Negative for carotid bruits. JVP not elevated. Lungs: Clear bilaterally to auscultation without wheezes, rales, or rhonchi. Breathing is unlabored. Heart: RRR S1 S2 without murmurs, rubs, or gallops.    Abdomen: Soft, non-tender, non-distended with normoactive bowel sounds. No rebound/guarding. Extremities: No clubbing or cyanosis. No edema. Distal pedal pulses are 2+ and equal bilaterally. Neuro: Alert and oriented X 3. Moves all extremities spontaneously. Psych:  Responds to questions appropriately with a normal affect.   Intake/Output Summary (Last 24 hours) at 11/20/15 1346 Last data filed at 11/20/15 0852  Gross per 24 hour  Intake    120 ml  Output    648 ml  Net   -528 ml    Inpatient Medications:  . aspirin EC  325 mg Oral Daily  . calcium acetate  2,001 mg Oral TID WC  . cinacalcet  90 mg Oral QHS  . conjugated estrogens  1 Applicatorful Vaginal Daily  . diphenhydrAMINE  25 mg Oral QODAY  . ferrous sulfate  325 mg Oral BID WC  . fluticasone  2 spray Each Nare Daily  . furosemide  40 mg Oral Daily  . metoprolol succinate  25 mg Oral Daily  . potassium chloride  20 mEq Oral Daily  . sevelamer carbonate  1,600 mg Oral TID WC  . sevelamer carbonate  800 mg Oral With snacks  . sodium chloride  3 mL Intravenous Q12H  . tiotropium  18 mcg Inhalation Daily   Infusions:    Labs:  Recent Labs  11/18/15 0422 11/19/15 0333  NA 140 138  K 4.1 3.8  CL 104 106  CO2 21* 19*  GLUCOSE 91 80  BUN 83* 68*  CREATININE 9.92* 8.66*  CALCIUM 7.6* 6.9*   No results for input(s): AST, ALT, ALKPHOS, BILITOT, PROT, ALBUMIN in the last 72 hours.  Recent Labs  11/18/15 0422 11/19/15 0333  WBC 4.6 5.0  HGB 9.3* 9.3*  HCT 29.2* 28.9*  MCV 86.3 87.2  PLT 82* 99*   No results for input(s): CKTOTAL, CKMB,  TROPONINI in the last 72 hours. Invalid input(s): POCBNP No results for input(s): HGBA1C in the last 72 hours.   Weights: Filed Weights   11/17/15 1125 11/19/15 0452 11/20/15 0629  Weight: 110 lb (49.896 kg) 114 lb 14.4 oz (52.118 kg) 111 lb 11.2 oz (50.667 kg)     Radiology/Studies:  Dg Chest 2 View  11/17/2015  CLINICAL DATA:  Chest pain EXAM: CHEST  2 VIEW  COMPARISON:  08/21/2015 FINDINGS: Chronic cardiopericardial enlargement. There is no edema, consolidation, effusion, or pneumothorax. Small pneumoperitoneum, presumably from patient's peritoneal dialysis. There is no indication of abdominal pain. IMPRESSION: 1. No evidence of acute cardiopulmonary disease. 2. Pneumoperitoneum, presumably from patient's peritoneal dialysis access given no history of abdominal pain. 3. Chronic cardiomegaly. Electronically Signed   By: Marnee SpringJonathon  Watts M.D.   On: 11/17/2015 12:11   Ct Head Wo Contrast  11/17/2015  CLINICAL DATA:  Syncopal episode cooking in the kitchen. EXAM: CT HEAD WITHOUT CONTRAST TECHNIQUE: Contiguous axial images were obtained from the base of the skull through the vertex without intravenous contrast. COMPARISON:  None available for review. FINDINGS: Skull and Sinuses:Negative for fracture or destructive process. The visualized mastoids, middle ears, and imaged paranasal sinuses are clear. Few gas bubbles along the right vertebral artery and in the bilateral cavernous sinus region consistent with intravenous gas from IV access. Orbits: No acute abnormality. Brain: No evidence of acute infarction, hemorrhage, hydrocephalus, or mass lesion/mass effect. Volume averaging versus small cortical and subcortical gliosis in the high right frontal lobe, lateral compatible with a remote small vessel infarct. There are bilateral sulcal calcifications which are likely sequela of infection or inflammation. Atherosclerotic calcifications is much less likely given size and location. These results were called by telephone at the time of interpretation on 11/17/2015 at 12:32 pm to Dr. Jene EveryOBERT KINNER , who verbally acknowledged these results. IMPRESSION: No acute finding. Electronically Signed   By: Marnee SpringJonathon  Watts M.D.   On: 11/17/2015 12:35   Mr Brain Wo Contrast  11/17/2015  CLINICAL DATA:  Patient became lethargic after house chores. Lethargy and confusion and dizziness.  Syncopal episode. End-stage renal disease. EXAM: MRI HEAD WITHOUT CONTRAST TECHNIQUE: Multiplanar, multiecho pulse sequences of the brain and surrounding structures were obtained without intravenous contrast. COMPARISON:  CT head earlier today. FINDINGS: No evidence for acute infarction, hemorrhage, mass lesion, hydrocephalus, or extra-axial fluid. Mild cerebral and cerebellar atrophy, premature for age. Slight prominence of perivascular spaces, nonspecific, can be seen in chronic hypertension. 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 Paucity of white matter disease elsewhere. Diminished caliber of the LEFT internal carotid artery compared to the RIGHT in its petrous, cavernous, and supraclinoid segments, possibly also in the high cervical region. Extracranial stenosis or dissection could have this appearance. Symmetric and equal vertebral flow voids contribute to normal-appearing basilar artery. Normal midline structures.  No tonsillar herniation. Negative orbits, sinuses, and mastoids. RIGHT middle turbinate concha bullosa. No definite osseous findings. IMPRESSION: No acute intracranial findings.  Specifically no acute stroke. 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 Apparent diminished caliber of the LEFT internal carotid artery through much of its visualized course. A proximal extracranial stenosis or dissection is not excluded. Recommend CT angiography of the head and neck for further evaluation to evaluate the LEFT carotid system. Electronically Signed   By: Elsie StainJohn T Curnes M.D.   On: 11/17/2015 21:02   [place] Carotid Bilateral  11/18/2015  CLINICAL DATA:  Cerebral infarction. EXAM: BILATERAL CAROTID  DUPLEX ULTRASOUND TECHNIQUE: Wallace Cullens scale imaging, color Doppler and duplex ultrasound were performed of bilateral carotid and vertebral arteries in the neck. COMPARISON:  None. FINDINGS: Criteria: Quantification of carotid stenosis  is based on velocity parameters that correlate the residual internal carotid diameter with NASCET-based stenosis levels, using the diameter of the distal internal carotid lumen as the denominator for stenosis measurement. The following velocity measurements were obtained: RIGHT ICA:  54/19 cm/sec CCA:  91/28 cm/sec SYSTOLIC ICA/CCA RATIO:  0.6 DIASTOLIC ICA/CCA RATIO:  0.7 ECA:  45 cm/sec LEFT ICA:  25/11 cm/sec CCA:  65/13 cm/sec SYSTOLIC ICA/CCA RATIO:  0.4 DIASTOLIC ICA/CCA RATIO:  0.8 ECA:  49 cm/sec RIGHT CAROTID ARTERY: There is a mild amount of calcified plaque at the level of the distal common carotid artery, carotid bulb and proximal ICA. Based on velocities estimated right ICA stenosis is less than 50%. RIGHT VERTEBRAL ARTERY: Antegrade flow with normal waveform and velocity. LEFT CAROTID ARTERY: Moderate focal calcified plaque present at the level of the distal bulb and proximal ICA. Based on turbulent flow focally present in the plaque, there may be a component of plaque ulceration. Based on normal velocities, estimated left ICA stenosis is less than 50%. LEFT VERTEBRAL ARTERY: Antegrade flow with normal waveform and velocity. IMPRESSION: No significant carotid stenosis identified. Bilateral plaque noted at the level of the bulbs and proximal internal carotid arteries, left greater than right. The proximal ICA plaque on the left may be partially ulcerated. Estimated bilateral ICA stenoses are less than 50%. Electronically Signed   By: Irish Lack M.D.   On: 11/18/2015 15:37     Assessment and Plan   1. 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 Presyncope with associated paresthesias: -Possible TIA, small stroke Left leg weakness, as well as left hand/arm Still with residual symptoms, walking with a cane We will recommend 30 day monitor in effort to identify arrhythmia/possible atrial  fibrillation Recommended she start low-dose aspirin  3. Chronic combined systolic and diastolic CHF: On dialysis, appears euvolemic  4. ESRD: -Per renal Currently on the renal transplant list Case discussed with Dr. Ronn Melena  5. HTN: -Controlled, Continue current medications  6. Ongoing tobacco abuse: -Cessation advised   Signed, Dossie Arbour, MD [facility] 11/20/2015, 1:46 PM

## 2015-11-20 NOTE — Telephone Encounter (Signed)
Where would you like to put this patient

## 2015-11-20 NOTE — Progress Notes (Signed)
A&O. Independent. Peritoneal dialysis given last night. Medicated once for headache and anxiety. Slept well. Possible TEE today.

## 2015-11-20 NOTE — Telephone Encounter (Signed)
Send this to denisa for hospital follow up protocol . Thursday 11:30

## 2015-11-20 NOTE — Telephone Encounter (Signed)
Dorothy Long [phone] [facility] from hospital [facility]. Pt is being discharged today. Diagnosis is Afib. No appt to sch. Let me know where to sch. Thank You!

## 2015-11-20 NOTE — Care Management (Signed)
has ruled out for acute CVA.  Will add SN to home health order for skilled observations/assessment

## 2015-11-20 NOTE — Progress Notes (Signed)
[facility]  ROUNDING NOTE   Subjective:  Peritoneal dialysis overnight. UF of 600mL 1.5% dextrose  Father at bedside.    Objective:  Vital signs in last 24 hours:  Temp:  [98.3 F (36.8 C)-98.5 F (36.9 C)] 98.5 F (36.9 C) (12/06 0629) Pulse Rate:  [61-79] 69 (12/06 0755) Resp:  [16-22] 22 (12/06 0629) BP: (104-113)/(70-85) 104/70 mmHg (12/06 0755) SpO2:  [96 %-100 %] 96 % (12/06 0629) Weight:  [50.667 kg (111 lb 11.2 oz)] 50.667 kg (111 lb 11.2 oz) (12/06 0629)  Weight change: -1.452 kg (-3 lb 3.2 oz) Filed Weights   11/17/15 1125 11/19/15 0452 11/20/15 0629  Weight: 49.896 kg (110 lb) 52.118 kg (114 lb 14.4 oz) 50.667 kg (111 lb 11.2 oz)    Intake/Output: I/O last 3 completed shifts: In: 9120 [P.O.:120; Other:9000] Out: [id number] [Other:10266]   Intake/Output this shift:  Total I/O In: -  Out: 648 [Other:648]  Physical Exam: General: NAD, sitting up in bed  Head: Normocephalic, atraumatic. Moist oral mucosal membranes  Eyes: Anicteric  Neck: Supple, trachea midline  Lungs:  Clear to auscultation normal effort  Heart: Regular rate and rhythm no rubs  Abdomen:  Soft, nontender, BS present  Extremities: no peripheral edema.  Neurologic: Awake, alert, follows commands.  Left sided weakness noted 4/5.  Skin: No lesions  Access: PD catheter in place, dressings clean    Basic Metabolic Panel:  Recent Labs Lab 11/17/15 1110 11/18/15 0422 11/19/15 0333  NA 138 140 138  K 3.6 4.1 3.8  CL 98* 104 106  CO2 24 21* 19*  GLUCOSE 109* 91 80  BUN 73* 83* 68*  CREATININE 9.48* 9.92* 8.66*  CALCIUM 8.4* 7.6* 6.9*    Liver Function Tests: No results for input(s): AST, ALT, ALKPHOS, BILITOT, PROT, ALBUMIN in the last 168 hours. No results for input(s): LIPASE, AMYLASE in the last 168 hours. No results for input(s): AMMONIA in the last 168 hours.  CBC:  Recent Labs Lab 11/17/15 1110 11/17/15 1319 11/18/15 0422 11/19/15 0333  WBC 5.7 6.6 4.6 5.0   HGB 10.5* 9.7* 9.3* 9.3*  HCT 32.3* 30.5* 29.2* 28.9*  MCV 86.0 85.7 86.3 87.2  PLT 79* 76* 82* 99*    Cardiac Enzymes:  Recent Labs Lab 11/17/15 1110  TROPONINI 0.14*    BNP: Invalid input(s): POCBNP  CBG:  Recent Labs Lab 11/17/15 1259 11/18/15 0858 11/18/15 1138  GLUCAP 89 138* 93    Microbiology: Results for orders placed or performed during the hospital encounter of 10/11/15  Surgical pcr screen     Status: None   Collection Time: 10/11/15  7:33 PM  Result Value Ref Range Status   MRSA, PCR NEGATIVE NEGATIVE Final   Staphylococcus aureus NEGATIVE NEGATIVE Final    Comment:        The Xpert SA Assay (FDA approved for NASAL specimens in patients over 52 years of age), is one component of a comprehensive surveillance program.  Test performance has been validated by [facility] for patients greater than or equal to 52 year old. It is not intended to diagnose infection nor to guide or monitor treatment.     Coagulation Studies:  Recent Labs  11/17/15 1110  LABPROT 14.7  INR 1.13    Urinalysis:  Recent Labs  11/18/15 1122  COLORURINE YELLOW*  LABSPEC 1.015  PHURINE 5.0  GLUCOSEU NEGATIVE  HGBUR 1+*  BILIRUBINUR NEGATIVE  KETONESUR NEGATIVE  PROTEINUR 100*  NITRITE NEGATIVE  LEUKOCYTESUR 2+*  Imaging: US Carotid Bilateral  11/18/2015  CLINICAL DATA:  Cerebral infarction. EXAM: BILATERAL CAROTID DUPLEX ULTRASOUND TECHNIQUE: Wallace Cullens scale imaging, color Doppler and duplex ultrasound were performed of bilateral carotid and vertebral arteries in the neck. COMPARISON:  None. FINDINGS: Criteria: Quantification of carotid stenosis is based on velocity parameters that correlate the residual internal carotid diameter with NASCET-based stenosis levels, using the diameter of the distal internal carotid lumen as the denominator for stenosis measurement. The following velocity measurements were obtained: RIGHT ICA:  54/19 cm/sec CCA:  91/28 cm/sec  SYSTOLIC ICA/CCA RATIO:  0.6 DIASTOLIC ICA/CCA RATIO:  0.7 ECA:  45 cm/sec LEFT ICA:  25/11 cm/sec CCA:  65/13 cm/sec SYSTOLIC ICA/CCA RATIO:  0.4 DIASTOLIC ICA/CCA RATIO:  0.8 ECA:  49 cm/sec RIGHT CAROTID ARTERY: There is a mild amount of calcified plaque at the level of the distal common carotid artery, carotid bulb and proximal ICA. Based on velocities estimated right ICA stenosis is less than 50%. RIGHT VERTEBRAL ARTERY: Antegrade flow with normal waveform and velocity. LEFT CAROTID ARTERY: Moderate focal calcified plaque present at the level of the distal bulb and proximal ICA. Based on turbulent flow focally present in the plaque, there may be a component of plaque ulceration. Based on normal velocities, estimated left ICA stenosis is less than 50%. LEFT VERTEBRAL ARTERY: Antegrade flow with normal waveform and velocity. IMPRESSION: No significant carotid stenosis identified. Bilateral plaque noted at the level of the bulbs and proximal internal carotid arteries, left greater than right. The proximal ICA plaque on the left may be partially ulcerated. Estimated bilateral ICA stenoses are less than 50%. Electronically Signed   By: Irish Lack M.D.   On: 11/18/2015 15:37     Medications:     . aspirin EC  325 mg Oral Daily  . calcium acetate  2,001 mg Oral TID WC  . cinacalcet  90 mg Oral QHS  . conjugated estrogens  1 Applicatorful Vaginal Daily  . diphenhydrAMINE  25 mg Oral QODAY  . ferrous sulfate  325 mg Oral BID WC  . fluticasone  2 spray Each Nare Daily  . furosemide  40 mg Oral Daily  . metoprolol succinate  25 mg Oral Daily  . potassium chloride  20 mEq Oral Daily  . sevelamer carbonate  1,600 mg Oral TID WC  . sevelamer carbonate  800 mg Oral With snacks  . sodium chloride  3 mL Intravenous Q12H  . tiotropium  18 mcg Inhalation Daily   acetaminophen **OR** acetaminophen, albuterol, ALPRAZolam, cyclobenzaprine, hydrocerin, hydrOXYzine, [DISCONTINUED] ondansetron **OR**  ondansetron (ZOFRAN) IV, ondansetron  Assessment/ Plan:  54 y.o. female with PMHx of of hypertension, hyperlipidemia, vitamin D deficiency, tobacco abuse, edema, cholecystectomy,hysterectomy, ESRD secondary to FSGS, SHPTH, severe mitral stenosis,  Liver cirrhosis, inguinal hernia repair.  [facility] PD [address]   1.  ESRD on PD: Tolerated peritoneal dialysis last night. UF at goal.. She gets nightly dialysis.  - Continue dextrose 1.5%.   - Continue potassium for hypokalemia due to dialysis.   2.  Anemia chronic kidney disease.  Hemoglobin currently 9.3.  Patient is on Epogen as an outpatient.  No indication for inpatient administration.   3.  Secondary hyperparathyroidism.  The patient has had rather difficult to control secondary hyperparathyroidism as an outpatient. PTH >3000, phos >9 - calcium acetate, Sensipar and Renvela.  4.  Hypertension: with syncopal episode and left sided weakness.   The symptoms were concerning for an underlying CVA.  MRI of the brain was negative  or acute CVA.   - Appreciate cardiology input. Appreciate neurology input.  - blood pressure on the low side.  - furosemide and metoprolol.   LOS: 3 Roderick Sweezy 12/6/201610:48 AM

## 2015-11-20 NOTE — Progress Notes (Signed)
Discharge instructions along with home medication list and follow up gone over with patient. Patient verbalized that she understood instruction. Printed rx x2 given to patient. Personal walker at bedside. Iv and telemetry removed. No c/o pain no distress noted. Patient discharged home with home health.

## 2015-11-21 ENCOUNTER — Telehealth: Payer: Self-pay

## 2015-11-21 NOTE — Telephone Encounter (Signed)
TCM call and appointment confirmed for 11/22/15 at 1130.

## 2015-11-21 NOTE — Telephone Encounter (Signed)
Transition Care Management Follow-up Telephone Call   Date discharged? 11/20/15   How have you been since you were released from the hospital? Appetite is increasing.  I still have left sided weakness.  Husband is helping me with everything needed.  Home Health has not been in touch yet.     Do you understand why you were in the hospital? Yes, TIA   Do you understand the discharge instructions? Yes, and I am monitoring my blood pressure regularly.  It has been fluctuating a little but I am managing with the medication ok.   Where were you discharged to? Home   Items Reviewed:  Medications reviewed: Yes, taking potassium and aspirin with no problems.  Continuing all other medications as prescribed.  Allergies reviewed: Yes, no changes  Dietary changes reviewed: Yes, renal diet, no problems  Referrals reviewed: Yes, home health and physical therapy   Functional Questionnaire:   Activities of Daily Living (ADLs):   She states they are independent in the following: Toileting, self feeding, grooming States they require assistance with the following: Ambulating (uses cane/walker), bathing, dressing, meal prep (husband helps).   Any transportation issues/concerns?: None, husband to bring.   Any patient concerns? Discuss blood pressure medication administration.   Confirmed importance and date/time of follow-up visits scheduled Yes, appointment scheduled on 11/22/15 at 1130.  Provider Appointment booked with Dr. Darrick Huntsmanullo (PCP).  Confirmed with patient if condition begins to worsen call PCP or go to the ER.  Patient was given the office number and encouraged to call back with question or concerns.  : Yes, patient verbalized understanding.

## 2015-11-21 NOTE — Telephone Encounter (Signed)
Will follow up 

## 2015-11-22 ENCOUNTER — Encounter (INDEPENDENT_AMBULATORY_CARE_PROVIDER_SITE_OTHER): Payer: BC Managed Care – PPO

## 2015-11-22 ENCOUNTER — Ambulatory Visit (INDEPENDENT_AMBULATORY_CARE_PROVIDER_SITE_OTHER): Payer: BC Managed Care – PPO | Admitting: Internal Medicine

## 2015-11-22 ENCOUNTER — Encounter: Payer: Self-pay | Admitting: Internal Medicine

## 2015-11-22 VITALS — BP 110/78 | HR 78 | Temp 98.1°F | Resp 14 | Ht 70.0 in | Wt 117.0 lb

## 2015-11-22 DIAGNOSIS — I6309 Cerebral infarction due to thrombosis of other precerebral artery: Secondary | ICD-10-CM | POA: Diagnosis not present

## 2015-11-22 DIAGNOSIS — Z09 Encounter for follow-up examination after completed treatment for conditions other than malignant neoplasm: Secondary | ICD-10-CM

## 2015-11-22 DIAGNOSIS — J322 Chronic ethmoidal sinusitis: Secondary | ICD-10-CM | POA: Insufficient documentation

## 2015-11-22 DIAGNOSIS — J0120 Acute ethmoidal sinusitis, unspecified: Secondary | ICD-10-CM

## 2015-11-22 DIAGNOSIS — R55 Syncope and collapse: Secondary | ICD-10-CM | POA: Diagnosis not present

## 2015-11-22 MED ORDER — AMOXICILLIN-POT CLAVULANATE 875-125 MG PO TABS: 1.0000 | ORAL_TABLET | Freq: Two times a day (BID) | ORAL | Status: DC

## 2015-11-22 MED ORDER — ATORVASTATIN CALCIUM 20 MG PO TABS: 20.0000 mg | ORAL_TABLET | Freq: Every day | ORAL | Status: DC

## 2015-11-22 NOTE — Patient Instructions (Addendum)
i  am treating you for bacterial sinusitis    I am prescribing an antibiotic AUGMENTIN   To manage the infection and the inflammation in your ear/sinuses.   I also advise use of the following OTC meds to help with your other symptoms.   Take generic OTC benadryl 25 mg at bedtime  for the drainage,  Afrin nasal spray instead of flonase for 4-5 days  for congestion,   flush your sinuses twice daily with NeilMed sinus rinse  (do over the sink because if you do it right you will spit out globs of mucus)    Please take a probiotic ( Align, Flora que or Culturelle) OR A GENERIC EQUIVALENT for three weeks since you are taking an  antibiotic to prevent a very serious antibiotic associated infection  Called clostridium dificile colitis that can cause diarrhea, multi organ failure, sepsis and death if not managed.   Do not take the metoprolol  Unless your BP is OVER 130/80 OR YOUR HEART RATE IS > 110  I am adding atorvastatin for cholesterol ,  For stroke prevention

## 2015-11-22 NOTE — Discharge Summary (Signed)
[facility] - [place] at [facility]  DISCHARGE SUMMARY   PATIENT NAME: Dorothy Long    MR#:  [id number]  DATE OF BIRTH:  12/08/1961  DATE OF ADMISSION:  11/17/2015 ADMITTING PHYSICIAN: Houston Siren, MD  DATE OF DISCHARGE: 11/22/2015  PRIMARY CARE PHYSICIAN: Sherlene Shams, MD    ADMISSION DIAGNOSIS:  Cerebral infarction due to unspecified mechanism [I63.9]  DISCHARGE DIAGNOSIS:  Active Problems:   Mitral stenosis with incompetence or regurgitation   Anemia in chronic kidney disease   Paroxysmal atrial fibrillation (HCC)   Carotid artery narrowing   Cerebral infarction due to unspecified mechanism   SECONDARY DIAGNOSIS:   Past Medical History  Diagnosis Date  . Hypertension   . Hyperlipidemia   . COPD (chronic obstructive pulmonary disease) (HCC)     a. not on home O2  . Anemia   . Valvular disease     a. 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 echo 2015: EF 45-50%, borderline LVH, mod dilated LA, mildly dilated RA, small pericardial effusion, mod MR (rheumatic deformity), MS mild-mod, no gradient, PASP ele  . Chronic combined systolic and diastolic CHF (congestive heart failure) (HCC)     a. echo reports above  . PAF (paroxysmal atrial fibrillation) (HCC)     a. in setting of diarrhea 09/21/2013; b. not on long term full dose anticoagulation; c. CHADSVASC at least 4 (CHF, HTN, DM, female)  . Diabetes mellitus (HCC)   . Peritoneal dialysis status (HCC) 4- 15-15  . Ventral hernia   . Inguinal hernia   . History of stress test     a. 2014: no convincing evidence of pharmacologically induced ischemia,  apparent ant apical defect present only on attenuation corrected images favored to reflect artifact due to overcorrection & may be worse on stress imaging secondary  to greater patient motion. A small area of ischemia is felt less likely but is difficult to entirely exclude, EF 55%  . ESRD (end stage renal disease) on dialysis (HCC)     a. HD on T,T,S; b. 2/2 focal segmental glomerulosclerosis   . Dialysis patient [facility])     HOSPITAL COURSE:    1. Syncopal event with residual left-sided weakness - Neuro imaging is negative for acute CVA, does show remote right posterior frontal and subcortical infarct. Also showing diminished caliber of left internal carotid artery. - Echo report shows significant mitral valve stenosis and calcification, TEE is recommended - Carotid Doppler shows no significant stenosis - Physical therapy demonstrates decreased strength in the left lower extremity. Recommendation is for home health physical therapy with rolling walker.  2. End-stage renal disease on peritoneal dialysis - Appreciate nephrology following, continue with PD at night  #3 secondary hyperparathyroidism-continue Renvela, Sensipar.  #4 anxiety-continue Xanax when necessary  #5 COPD-no acute exacerbation. Continue Spiriva.  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  Possible changes on ECHO with worsening mitral valve disease.  Plan is for TEE as outpatient.   #7 paroxysmal atrial fibrillation: Currently in sinus rhythm.  #8 combined systolic and diastolic heart failure: - No exacerbation during hospitalization.  DISCHARGE CONDITIONS:   stable  CONSULTS OBTAINED:  Treatment Team:  Iran Ouch, MD  DRUG ALLERGIES:   Allergies  Allergen Reactions  .  Ciprocinonide [Fluocinolone] Hives  . Demerol [Meperidine] Nausea And Vomiting  . Diflucan [Fluconazole] Nausea Only  . Flagyl [Metronidazole] Hives and Itching  . Hydrocodone Itching  . Levaquin [Levofloxacin In D5w] Other (See Comments)    Numbness in legs  . Morphine And Related Nausea And Vomiting  .  Tetracyclines & Related Itching and Swelling  . Valium [Diazepam] Nausea Only    hallucinations    DISCHARGE MEDICATIONS:   Discharge Medication List as of 11/20/2015 11:58 AM    START taking these medications   Details  aspirin EC 81 MG tablet Take 1 tablet (81 mg total) by mouth daily., Starting 11/20/2015, Until Discontinued, Print      CONTINUE these medications which have CHANGED   Details  potassium chloride (K-DUR,KLOR-CON) 10 MEQ tablet Take 2 tablets (20 mEq total) by mouth daily., Starting 11/20/2015, Until Discontinued, Print      CONTINUE these medications which have NOT CHANGED   Details  acetaminophen (TYLENOL) 325 MG tablet Take 325 mg by mouth every 4 (four) hours as needed for mild pain. , Until Discontinued, Historical Med    AURYXIA 210 MG TABS Take 3 tablets by mouth 3 (three) times daily., Until Discontinued, Historical Med    Calcium Acetate 667 MG TABS Take 3 capsules by mouth 3 (three) times daily., Until Discontinued, Historical Med    cinacalcet (SENSIPAR) 90 MG tablet Take 90 mg by mouth at bedtime. , Until Discontinued, Historical Med    furosemide (LASIX) 40 MG tablet Take 40-80 mg by mouth daily., Until Discontinued, Historical Med    metoprolol succinate (TOPROL-XL) 25 MG 24 hr tablet Take 25 mg by mouth daily., Until Discontinued, Historical Med    RENVELA 800 MG tablet Take 1,600 mg by mouth 3 (three) times daily with meals. And one tablet with snacks, Starting 06/17/2015, Until Discontinued, Historical Med    tiotropium (SPIRIVA) 18 MCG inhalation capsule Place 1 capsule (18 mcg total) into inhaler and inhale daily., Starting 08/22/2015, Until Discontinued, Normal    albuterol (VENTOLIN HFA) 108 (90 BASE) MCG/ACT inhaler Inhale 1-2 puffs into the lungs every 6 (six) hours as needed for wheezing or shortness of breath. , Until Discontinued, Historical Med    ALPRAZolam (XANAX) 0.25 MG tablet Take 1 tablet (0.25 mg total) by mouth 3 (three) times daily  as needed for sleep., Starting 06/16/2015, Until Discontinued, Print    diphenhydrAMINE (BENADRYL) 25 MG tablet Take 25 mg by mouth every other day. MON, WED, FRI BEFORE DIALYSIS, Until Discontinued, Historical Med    ondansetron (ZOFRAN ODT) 4 MG disintegrating tablet Take 1 tablet (4 mg total) by mouth every 8 (eight) hours as needed for nausea or vomiting., Starting 10/12/2015, Until Discontinued, Normal      STOP taking these medications     conjugated estrogens (PREMARIN) vaginal cream      cyclobenzaprine (FLEXERIL) 10 MG tablet      fluticasone (FLONASE) 50 MCG/ACT nasal spray      hydrOXYzine (ATARAX/VISTARIL) 10 MG tablet      Skin Protectants, Misc. (EUCERIN) cream          DISCHARGE INSTRUCTIONS:    Renal, heart healthy diet.  Home health PT with rolling walker. Stable condition.  Follow up with PCP.  If you experience worsening of your admission symptoms, develop shortness of breath, life threatening emergency, suicidal or homicidal thoughts you must seek medical attention immediately by calling 911 or calling your MD immediately  if symptoms less severe.  You Must read complete  instructions/literature along with all the possible adverse reactions/side effects for all the Medicines you take and that have been prescribed to you. Take any new Medicines after you have completely understood and accept all the possible adverse reactions/side effects.   Please note  You were cared for by a hospitalist during your hospital stay. If you have any questions about your discharge medications or the care you received while you were in the hospital after you are discharged, you can call the unit and asked to speak with the hospitalist on call if the hospitalist that took care of you is not available. Once you are discharged, your primary care physician will handle any further medical issues. Please note that NO REFILLS for any discharge medications will be authorized once you are  discharged, as it is imperative that you return to your primary care physician (or establish a relationship with a primary care physician if you do not have one) for your aftercare needs so that they can reassess your need for medications and monitor your lab values.    Today   CHIEF COMPLAINT:   Chief Complaint  Patient presents with  . Loss of Consciousness  . Chest Pain    HISTORY OF PRESENT ILLNESS:  Dorothy Long is a 54 y.o. female with a known history of end-stage renal disease on peritoneal dialysis, hypertension, hyperlipidemia, COPD with ongoing tobacco abuse, history of CHF, paroxysmal atrial fibrillation, diabetes who presents to the hospital due to slurred speech/confusion. Patient herself is a poor historian but the history obtained from the husband at bedside. As per the husband patient was doing some cleaning around the house in the kitchen and then shortly thereafter became lethargic and dizzy and then became somewhat confused. She was too weak and therefore crawled from the kitchen to her recliner to get some rest. She will call her husband on the phone and husband could not understand her due to some slurred speech. He called EMS and therefore she was brought to the ER for further evaluation. In the ER patient was noted to have a left-sided nasolabial fold droop. She is suspected to have a possible CVA and therefore hospitalist services were contacted further treatment and evaluation. Patient did admit to a headache, nausea but no vomiting. She denies any numbness tingling, seizures.   VITAL SIGNS:  Blood pressure 104/70, pulse 69, temperature 98.5 F (36.9 C), temperature source Oral, resp. rate 22, height  (1.753 m), weight 50.667 kg (111 lb 11.2 oz), SpO2 96 %.  I/O:  No intake or output data in the 24 hours ending 11/22/15 1259  PHYSICAL EXAMINATION:  GENERAL: 54 y.o.-year-old patient sitting beside the bed, no distress LUNGS: Normal breath sounds  bilaterally, no wheezing, rales,rhonchi or crepitation. No use of accessory muscles of respiration.  CARDIOVASCULAR: S1, S2 normal. No murmurs, rubs, or gallops.  ABDOMEN: Soft, nontender, nondistended. Bowel sounds present. No organomegaly or mass. No guarding no rebound. Peritoneal dialysis port is in place no surrounding erythema or exudate EXTREMITIES: No pedal edema, cyanosis, or clubbing. Pulses 2+ NEUROLOGIC: Cranial nerves II through XII are intact. Muscle strength 5/5 in all extremities, slightly diminished on the left. Sensation intact. Gait normal PSYCHIATRIC: The patient is alert and oriented x 3.  SKIN: No obvious rash, lesion, or ulcer.   DATA REVIEW:   CBC  Recent Labs Lab 11/19/15 0333  WBC 5.0  HGB 9.3*  HCT 28.9*  PLT 99*    Chemistries   Recent Labs Lab 11/19/15 [phone]  NA 138  K 3.8  CL 106  CO2 19*  GLUCOSE 80  BUN 68*  CREATININE 8.66*  CALCIUM 6.9*    Cardiac Enzymes  Recent Labs Lab 11/17/15 1110  TROPONINI 0.14*    Microbiology Results  Results for orders placed or performed during the hospital encounter of 10/11/15  Surgical pcr screen     Status: None   Collection Time: 10/11/15  7:33 PM  Result Value Ref Range Status   MRSA, PCR NEGATIVE NEGATIVE Final   Staphylococcus aureus NEGATIVE NEGATIVE Final    Comment:        The Xpert SA Assay (FDA approved for NASAL specimens in patients over 35 years of age), is one component of a comprehensive surveillance program.  Test performance has been validated by [facility] for patients greater than or equal to 23 year old. It is not intended to diagnose infection nor to guide or monitor treatment.     RADIOLOGY:  No results found.  EKG:   Orders placed or performed during the hospital encounter of 11/17/15  . EKG 12-Lead  . EKG 12-Lead  . ED EKG within 10 minutes  . ED EKG within 10 minutes  . ED EKG  . ED EKG    Management plans discussed with the patient, family and  they are in agreement.  CODE STATUS: full  TOTAL TIME TAKING CARE OF THIS PATIENT: 35 minutes.  Greater than 50% of time spent in care coordination and counseling.  Elby Showers M.D on 11/22/2015 at 12:59 PM  Between 7am to 6pm - Pager - [phone]  After 6pm go to [email or web address] - password [email or web address] [facility]  [place] [facility]  Office  [phone]  CC: Primary care physician; Sherlene Shams, MD

## 2015-11-22 NOTE — Progress Notes (Signed)
Subjective:  Patient ID: Dorothy Long, female    DOB: 10-14-1961  Age: 54 y.o. MRN: [id number]  CC: The primary encounter diagnosis was Acute ethmoidal sinusitis, recurrence not specified. Diagnoses of Cerebral infarction due to thrombosis of other precerebral artery [facility]) and Hospital discharge follow-up were also pertinent to this visit.  HPI Dorothy Long presents for hospital folow up.  She was admitted to [facility] on  December withlmental status changes and left sided weakness concerning for TIA/CVa and discharged  on Dec 6 .  MRI of the brain was negative for acute CVA but  Showed on  Remote right posterior and subcortical infarct,and Left ICA with diminished caliber but no stenosis by doppler.  she continues to have weakness of the left leg that has improved but not resolved. ECHO noted severe mitral valve stenosis and regurgitation, . EF 45-50% , impaired relaxation.    She was discharged home with a 30 day monitor to assess for arrhythmias.     She cannot rise from a seated position without assistance/use of arms.  Home PT  Was been ordered but not in place yet.    Cc today is Sinus pain and purulent discharge for the last week.   With altered sense of taste.  No treatment yet.   Outpatient Prescriptions Prior to Visit  Medication Sig Dispense Refill  . acetaminophen (TYLENOL) 325 MG tablet Take 325 mg by mouth every 4 (four) hours as needed for mild pain.     Marland Kitchen albuterol (VENTOLIN HFA) 108 (90 BASE) MCG/ACT inhaler Inhale 1-2 puffs into the lungs every 6 (six) hours as needed for wheezing or shortness of breath.     . ALPRAZolam (XANAX) 0.25 MG tablet Take 1 tablet (0.25 mg total) by mouth 3 (three) times daily as needed for sleep. 30 tablet 0  . aspirin EC 81 MG tablet Take 1 tablet (81 mg total) by mouth daily. 30 tablet 0  . AURYXIA 210 MG TABS Take 3 tablets by mouth 3 (three) times daily.    . Calcium Acetate 667 MG TABS Take 3 capsules by mouth 3 (three) times daily.      . cinacalcet (SENSIPAR) 90 MG tablet Take 90 mg by mouth at bedtime.     . diphenhydrAMINE (BENADRYL) 25 MG tablet Take 25 mg by mouth every other day. MON, WED, FRI BEFORE DIALYSIS    . furosemide (LASIX) 40 MG tablet Take 40-80 mg by mouth daily.    . metoprolol succinate (TOPROL-XL) 25 MG 24 hr tablet Take 25 mg by mouth daily.    . ondansetron (ZOFRAN ODT) 4 MG disintegrating tablet Take 1 tablet (4 mg total) by mouth every 8 (eight) hours as needed for nausea or vomiting. 20 tablet 0  . potassium chloride (K-DUR,KLOR-CON) 10 MEQ tablet Take 2 tablets (20 mEq total) by mouth daily. 30 tablet 0  . RENVELA 800 MG tablet Take 1,600 mg by mouth 3 (three) times daily with meals. And one tablet with snacks    . tiotropium (SPIRIVA) 18 MCG inhalation capsule Place 1 capsule (18 mcg total) into inhaler and inhale daily. 30 capsule 0   No facility-administered medications prior to visit.    Review of Systems;  Patient denies headache, fevers, malaise, unintentional weight loss, skin rash, eye pain, sinus congestion and sinus pain, sore throat, dysphagia,  hemoptysis , cough, dyspnea, wheezing, chest pain, palpitations, orthopnea, edema, abdominal pain, nausea, melena, diarrhea, constipation, flank pain, dysuria, hematuria, urinary  Frequency, nocturia, numbness, tingling,  seizures,  Focal weakness, Loss of consciousness,  Tremor, insomnia, depression, anxiety, and suicidal ideation.      Objective:  BP 110/78 mmHg  Pulse 78  Temp(Src) 98.1 F (36.7 C) (Oral)  Resp 14  Ht 5\' 10"  (1.778 m)  Wt 117 lb (53.071 kg)  BMI 16.79 kg/m2  SpO2 96%  BP Readings from Last 3 Encounters:  11/22/15 110/78  11/20/15 104/70  11/12/15 106/66    Wt Readings from Last 3 Encounters:  11/22/15 117 lb (53.071 kg)  11/20/15 111 lb 11.2 oz (50.667 kg)  11/12/15 111 lb 12 oz (50.689 kg)    General appearance: alert, cooperative and appears stated age Ears: normal TM's and external ear canals both  ears Throat: lips, mucosa, and tongue normal; teeth and gums normal Neck: no adenopathy, no carotid bruit, supple, symmetrical, trachea midline and thyroid not enlarged, symmetric, no tenderness/mass/nodules Back: symmetric, no curvature. ROM normal. No CVA tenderness. Lungs: clear to auscultation bilaterally Heart: regular rate and rhythm, S1, S2 normal, no murmur, click, rub or gallop Abdomen: soft, non-tender; bowel sounds normal; no masses,  no organomegaly Pulses: 2+ and symmetric Skin: Skin color, texture, turgor normal. No rashes or lesions Lymph nodes: Cervical, supraclavicular, and axillary nodes normal.  No results found for: HGBA1C  Lab Results  Component Value Date   CREATININE 8.66* 11/19/2015   CREATININE 9.92* 11/18/2015   CREATININE 9.48* 11/17/2015    Lab Results  Component Value Date   WBC 5.0 11/19/2015   HGB 9.3* 11/19/2015   HCT 28.9* 11/19/2015   PLT 99* 11/19/2015   GLUCOSE 80 11/19/2015   CHOL 120 05/03/2014   TRIG 120 05/03/2014   HDL 44 05/03/2014   LDLDIRECT 164.6 07/21/2012   LDLCALC 52 05/03/2014   ALT 15 10/11/2015   AST 21 10/11/2015   NA 138 11/19/2015   K 3.8 11/19/2015   CL 106 11/19/2015   CREATININE 8.66* 11/19/2015   BUN 68* 11/19/2015   CO2 19* 11/19/2015   TSH 0.83 07/10/2013   INR 1.13 11/17/2015   MICROALBUR 63.1* 06/19/2014    [place] Carotid Bilateral  11/18/2015  CLINICAL DATA:  Cerebral infarction. EXAM: BILATERAL CAROTID DUPLEX ULTRASOUND TECHNIQUE: Wallace CullensGray scale imaging, color Doppler and duplex ultrasound were performed of bilateral carotid and vertebral arteries in the neck. COMPARISON:  None. FINDINGS: Criteria: Quantification of carotid stenosis is based on velocity parameters that correlate the residual internal carotid diameter with NASCET-based stenosis levels, using the diameter of the distal internal carotid lumen as the denominator for stenosis measurement. The following velocity measurements were obtained: RIGHT ICA:   54/19 cm/sec CCA:  91/28 cm/sec SYSTOLIC ICA/CCA RATIO:  0.6 DIASTOLIC ICA/CCA RATIO:  0.7 ECA:  45 cm/sec LEFT ICA:  25/11 cm/sec CCA:  65/13 cm/sec SYSTOLIC ICA/CCA RATIO:  0.4 DIASTOLIC ICA/CCA RATIO:  0.8 ECA:  49 cm/sec RIGHT CAROTID ARTERY: There is a mild amount of calcified plaque at the level of the distal common carotid artery, carotid bulb and proximal ICA. Based on velocities estimated right ICA stenosis is less than 50%. RIGHT VERTEBRAL ARTERY: Antegrade flow with normal waveform and velocity. LEFT CAROTID ARTERY: Moderate focal calcified plaque present at the level of the distal bulb and proximal ICA. Based on turbulent flow focally present in the plaque, there may be a component of plaque ulceration. Based on normal velocities, estimated left ICA stenosis is less than 50%. LEFT VERTEBRAL ARTERY: Antegrade flow with normal waveform and velocity. IMPRESSION: No significant carotid stenosis identified. Bilateral plaque noted at  the level of the bulbs and proximal internal carotid arteries, left greater than right. The proximal ICA plaque on the left may be partially ulcerated. Estimated bilateral ICA stenoses are less than 50%. Electronically Signed   By: Irish Lack M.D.   On: 11/18/2015 15:37    Assessment & Plan:   Problem List Items Addressed This Visit    Hospital discharge follow-up    All labs , imaging studies and progress notes from admission were reviewed with patient today        CVA (cerebral infarction)    Left sided weakness,  No cute changes on MRI.  Prior infarcts noted.  Starting Recommending adding statin for stroke prevention .  Already taking aspirin       Ethmoid sinusitis - Primary    Given chronicity of symptoms, development of facial pain and exam consistent with bacterial URI,  Will treat with empiric antibiotics, decongestants, and saline lavage.  Probiotics advised       Relevant Medications   amoxicillin-clavulanate (AUGMENTIN) 875-125 MG tablet       I am having Ms. Nickolson start on amoxicillin-clavulanate and atorvastatin. I am also having her maintain her acetaminophen, diphenhydrAMINE, Calcium Acetate, albuterol, cinacalcet, ALPRAZolam, RENVELA, AURYXIA, tiotropium, furosemide, metoprolol succinate, ondansetron, aspirin EC, and potassium chloride.  Meds ordered this encounter  Medications  . amoxicillin-clavulanate (AUGMENTIN) 875-125 MG tablet    Sig: Take 1 tablet by mouth 2 (two) times daily.    Dispense:  14 tablet    Refill:  0  . atorvastatin (LIPITOR) 20 MG tablet    Sig: Take 1 tablet (20 mg total) by mouth daily.    Dispense:  90 tablet    Refill:  3    There are no discontinued medications.  Follow-up: Return in about 4 weeks (around 12/20/2015).   Sherlene Shams, MD

## 2015-11-22 NOTE — Progress Notes (Signed)
Pre-visit discussion using our clinic review tool. No additional management support is needed unless otherwise documented below in the visit note.  

## 2015-11-25 ENCOUNTER — Encounter: Payer: Self-pay | Admitting: Internal Medicine

## 2015-11-25 NOTE — Assessment & Plan Note (Signed)
Left sided weakness,  No cute changes on MRI.  Prior infarcts noted.  Starting Recommending adding statin for stroke prevention .  Already taking aspirin

## 2015-11-25 NOTE — Assessment & Plan Note (Signed)
Given chronicity of symptoms, development of facial pain and exam consistent with bacterial URI,  Will treat with empiric antibiotics, decongestants, and saline lavage.  Probiotics advised

## 2015-11-25 NOTE — Assessment & Plan Note (Signed)
All labs , imaging studies and progress notes from admission were reviewed with patient today  

## 2015-11-27 ENCOUNTER — Telehealth: Payer: Self-pay

## 2015-11-27 NOTE — Telephone Encounter (Signed)
02 sats @ 96 and patient stated that [facility] nurse heard crackles in lower left lobe, patient stated she pulled off 4 lbs of fluid with dialysis last night but has gained weight the last several days. Advised patient as stated by MD to call her nephrologist and report her symptoms.

## 2015-11-27 NOTE — Telephone Encounter (Signed)
Spoke with [facility] nurse regarding patient.  Nurse was at the house, patient concerned that she isn't feeling better today.  Was seen on last week, given antibiotics, she has 3 more days of them to take.  Feels SOB today, RN states she has crackles in her lungs.  Has over the counter mucinex she can take?  She was told by the front staff to go to the ER, but the patient is refusing.  Please advise? Office visit?

## 2015-11-27 NOTE — Telephone Encounter (Signed)
thre antibiotics were for sinusitis,  Not pneumonia ,  The crackesl are due to volume overload as she is ESKD. And is on dialysis.  Before I can respond I need hard data.  Pulse ox,  Wrights for the last several days,  Etc

## 2015-12-10 ENCOUNTER — Inpatient Hospital Stay
Admission: EM | Admit: 2015-12-10 | Discharge: 2015-12-19 | DRG: 064 | Payer: BC Managed Care – PPO | Attending: Internal Medicine | Admitting: Internal Medicine

## 2015-12-10 ENCOUNTER — Emergency Department: Payer: BC Managed Care – PPO

## 2015-12-10 ENCOUNTER — Encounter: Payer: Self-pay | Admitting: Emergency Medicine

## 2015-12-10 DIAGNOSIS — Z90710 Acquired absence of both cervix and uterus: Secondary | ICD-10-CM

## 2015-12-10 DIAGNOSIS — Z992 Dependence on renal dialysis: Secondary | ICD-10-CM

## 2015-12-10 DIAGNOSIS — F17210 Nicotine dependence, cigarettes, uncomplicated: Secondary | ICD-10-CM | POA: Diagnosis present

## 2015-12-10 DIAGNOSIS — Z9889 Other specified postprocedural states: Secondary | ICD-10-CM

## 2015-12-10 DIAGNOSIS — I248 Other forms of acute ischemic heart disease: Secondary | ICD-10-CM | POA: Diagnosis present

## 2015-12-10 DIAGNOSIS — Z8249 Family history of ischemic heart disease and other diseases of the circulatory system: Secondary | ICD-10-CM

## 2015-12-10 DIAGNOSIS — Z800 Family history of malignant neoplasm of digestive organs: Secondary | ICD-10-CM

## 2015-12-10 DIAGNOSIS — R4701 Aphasia: Secondary | ICD-10-CM | POA: Insufficient documentation

## 2015-12-10 DIAGNOSIS — I083 Combined rheumatic disorders of mitral, aortic and tricuspid valves: Secondary | ICD-10-CM | POA: Diagnosis present

## 2015-12-10 DIAGNOSIS — R778 Other specified abnormalities of plasma proteins: Secondary | ICD-10-CM | POA: Insufficient documentation

## 2015-12-10 DIAGNOSIS — G8191 Hemiplegia, unspecified affecting right dominant side: Secondary | ICD-10-CM | POA: Diagnosis present

## 2015-12-10 DIAGNOSIS — Z7982 Long term (current) use of aspirin: Secondary | ICD-10-CM

## 2015-12-10 DIAGNOSIS — I214 Non-ST elevation (NSTEMI) myocardial infarction: Secondary | ICD-10-CM | POA: Insufficient documentation

## 2015-12-10 DIAGNOSIS — Z8673 Personal history of transient ischemic attack (TIA), and cerebral infarction without residual deficits: Secondary | ICD-10-CM

## 2015-12-10 DIAGNOSIS — N2581 Secondary hyperparathyroidism of renal origin: Secondary | ICD-10-CM | POA: Diagnosis present

## 2015-12-10 DIAGNOSIS — D638 Anemia in other chronic diseases classified elsewhere: Secondary | ICD-10-CM | POA: Diagnosis present

## 2015-12-10 DIAGNOSIS — E876 Hypokalemia: Secondary | ICD-10-CM | POA: Diagnosis present

## 2015-12-10 DIAGNOSIS — E785 Hyperlipidemia, unspecified: Secondary | ICD-10-CM | POA: Diagnosis present

## 2015-12-10 DIAGNOSIS — Z79899 Other long term (current) drug therapy: Secondary | ICD-10-CM

## 2015-12-10 DIAGNOSIS — F419 Anxiety disorder, unspecified: Secondary | ICD-10-CM | POA: Diagnosis present

## 2015-12-10 DIAGNOSIS — R7989 Other specified abnormal findings of blood chemistry: Secondary | ICD-10-CM | POA: Insufficient documentation

## 2015-12-10 DIAGNOSIS — J449 Chronic obstructive pulmonary disease, unspecified: Secondary | ICD-10-CM | POA: Diagnosis present

## 2015-12-10 DIAGNOSIS — Z885 Allergy status to narcotic agent status: Secondary | ICD-10-CM

## 2015-12-10 DIAGNOSIS — I4510 Unspecified right bundle-branch block: Secondary | ICD-10-CM | POA: Diagnosis present

## 2015-12-10 DIAGNOSIS — Z9049 Acquired absence of other specified parts of digestive tract: Secondary | ICD-10-CM

## 2015-12-10 DIAGNOSIS — G459 Transient cerebral ischemic attack, unspecified: Secondary | ICD-10-CM | POA: Diagnosis present

## 2015-12-10 DIAGNOSIS — N269 Renal sclerosis, unspecified: Secondary | ICD-10-CM | POA: Diagnosis present

## 2015-12-10 DIAGNOSIS — Z9119 Patient's noncompliance with other medical treatment and regimen: Secondary | ICD-10-CM

## 2015-12-10 DIAGNOSIS — R410 Disorientation, unspecified: Secondary | ICD-10-CM

## 2015-12-10 DIAGNOSIS — I6529 Occlusion and stenosis of unspecified carotid artery: Secondary | ICD-10-CM | POA: Insufficient documentation

## 2015-12-10 DIAGNOSIS — I639 Cerebral infarction, unspecified: Principal | ICD-10-CM | POA: Diagnosis present

## 2015-12-10 DIAGNOSIS — J690 Pneumonitis due to inhalation of food and vomit: Secondary | ICD-10-CM | POA: Diagnosis present

## 2015-12-10 DIAGNOSIS — N186 End stage renal disease: Secondary | ICD-10-CM

## 2015-12-10 DIAGNOSIS — R509 Fever, unspecified: Secondary | ICD-10-CM

## 2015-12-10 DIAGNOSIS — I480 Paroxysmal atrial fibrillation: Secondary | ICD-10-CM | POA: Diagnosis present

## 2015-12-10 DIAGNOSIS — I472 Ventricular tachycardia: Secondary | ICD-10-CM | POA: Diagnosis present

## 2015-12-10 DIAGNOSIS — I132 Hypertensive heart and chronic kidney disease with heart failure and with stage 5 chronic kidney disease, or end stage renal disease: Secondary | ICD-10-CM | POA: Diagnosis present

## 2015-12-10 DIAGNOSIS — I5042 Chronic combined systolic (congestive) and diastolic (congestive) heart failure: Secondary | ICD-10-CM | POA: Diagnosis present

## 2015-12-10 DIAGNOSIS — E1122 Type 2 diabetes mellitus with diabetic chronic kidney disease: Secondary | ICD-10-CM | POA: Diagnosis present

## 2015-12-10 DIAGNOSIS — I420 Dilated cardiomyopathy: Secondary | ICD-10-CM | POA: Insufficient documentation

## 2015-12-10 DIAGNOSIS — Z888 Allergy status to other drugs, medicaments and biological substances status: Secondary | ICD-10-CM

## 2015-12-10 DIAGNOSIS — R471 Dysarthria and anarthria: Secondary | ICD-10-CM | POA: Diagnosis present

## 2015-12-10 DIAGNOSIS — I69322 Dysarthria following cerebral infarction: Secondary | ICD-10-CM | POA: Diagnosis present

## 2015-12-10 DIAGNOSIS — I050 Rheumatic mitral stenosis: Secondary | ICD-10-CM | POA: Insufficient documentation

## 2015-12-10 HISTORY — DX: Cerebral infarction, unspecified: I63.9

## 2015-12-10 HISTORY — DX: Disorder of kidney and ureter, unspecified: N28.9

## 2015-12-10 NOTE — ED Notes (Signed)
Patient transported to CT 

## 2015-12-10 NOTE — ED Notes (Signed)
Per EMS, patient comes from home. Patient was in the bathroom when she fell. EMS states patient was not trying to use the bathroom when she fell but was in the bathroom. EMS denies patient hitting head. Patient is A&O x4 and verbal at baseline. Patient is a dialysis patient and is in renal failure. Patient is currently alert but non verbal. Patient has purposeful movement. MD at bedside.

## 2015-12-11 ENCOUNTER — Encounter: Payer: Self-pay | Admitting: Internal Medicine

## 2015-12-11 ENCOUNTER — Observation Stay: Payer: BC Managed Care – PPO

## 2015-12-11 ENCOUNTER — Inpatient Hospital Stay: Payer: BC Managed Care – PPO

## 2015-12-11 DIAGNOSIS — I420 Dilated cardiomyopathy: Secondary | ICD-10-CM | POA: Diagnosis present

## 2015-12-11 DIAGNOSIS — Z888 Allergy status to other drugs, medicaments and biological substances status: Secondary | ICD-10-CM | POA: Diagnosis not present

## 2015-12-11 DIAGNOSIS — R4701 Aphasia: Secondary | ICD-10-CM | POA: Diagnosis present

## 2015-12-11 DIAGNOSIS — D638 Anemia in other chronic diseases classified elsewhere: Secondary | ICD-10-CM | POA: Diagnosis present

## 2015-12-11 DIAGNOSIS — I4510 Unspecified right bundle-branch block: Secondary | ICD-10-CM | POA: Diagnosis present

## 2015-12-11 DIAGNOSIS — R410 Disorientation, unspecified: Secondary | ICD-10-CM | POA: Diagnosis not present

## 2015-12-11 DIAGNOSIS — I5042 Chronic combined systolic (congestive) and diastolic (congestive) heart failure: Secondary | ICD-10-CM | POA: Diagnosis present

## 2015-12-11 DIAGNOSIS — I132 Hypertensive heart and chronic kidney disease with heart failure and with stage 5 chronic kidney disease, or end stage renal disease: Secondary | ICD-10-CM | POA: Diagnosis present

## 2015-12-11 DIAGNOSIS — I214 Non-ST elevation (NSTEMI) myocardial infarction: Secondary | ICD-10-CM | POA: Diagnosis not present

## 2015-12-11 DIAGNOSIS — Z9049 Acquired absence of other specified parts of digestive tract: Secondary | ICD-10-CM | POA: Diagnosis not present

## 2015-12-11 DIAGNOSIS — Z9889 Other specified postprocedural states: Secondary | ICD-10-CM | POA: Diagnosis not present

## 2015-12-11 DIAGNOSIS — I639 Cerebral infarction, unspecified: Secondary | ICD-10-CM | POA: Diagnosis present

## 2015-12-11 DIAGNOSIS — Z885 Allergy status to narcotic agent status: Secondary | ICD-10-CM | POA: Diagnosis not present

## 2015-12-11 DIAGNOSIS — Z8673 Personal history of transient ischemic attack (TIA), and cerebral infarction without residual deficits: Secondary | ICD-10-CM | POA: Diagnosis not present

## 2015-12-11 DIAGNOSIS — R471 Dysarthria and anarthria: Secondary | ICD-10-CM | POA: Diagnosis present

## 2015-12-11 DIAGNOSIS — N186 End stage renal disease: Secondary | ICD-10-CM | POA: Diagnosis present

## 2015-12-11 DIAGNOSIS — E1122 Type 2 diabetes mellitus with diabetic chronic kidney disease: Secondary | ICD-10-CM | POA: Diagnosis present

## 2015-12-11 DIAGNOSIS — I63412 Cerebral infarction due to embolism of left middle cerebral artery: Secondary | ICD-10-CM | POA: Diagnosis not present

## 2015-12-11 DIAGNOSIS — I248 Other forms of acute ischemic heart disease: Secondary | ICD-10-CM | POA: Diagnosis present

## 2015-12-11 DIAGNOSIS — E876 Hypokalemia: Secondary | ICD-10-CM | POA: Diagnosis present

## 2015-12-11 DIAGNOSIS — Z79899 Other long term (current) drug therapy: Secondary | ICD-10-CM | POA: Diagnosis not present

## 2015-12-11 DIAGNOSIS — Z992 Dependence on renal dialysis: Secondary | ICD-10-CM | POA: Diagnosis not present

## 2015-12-11 DIAGNOSIS — I69322 Dysarthria following cerebral infarction: Secondary | ICD-10-CM | POA: Diagnosis present

## 2015-12-11 DIAGNOSIS — G8191 Hemiplegia, unspecified affecting right dominant side: Secondary | ICD-10-CM | POA: Diagnosis present

## 2015-12-11 DIAGNOSIS — N2581 Secondary hyperparathyroidism of renal origin: Secondary | ICD-10-CM | POA: Diagnosis present

## 2015-12-11 DIAGNOSIS — Z9119 Patient's noncompliance with other medical treatment and regimen: Secondary | ICD-10-CM | POA: Diagnosis not present

## 2015-12-11 DIAGNOSIS — Z90710 Acquired absence of both cervix and uterus: Secondary | ICD-10-CM | POA: Diagnosis not present

## 2015-12-11 DIAGNOSIS — Z8249 Family history of ischemic heart disease and other diseases of the circulatory system: Secondary | ICD-10-CM | POA: Diagnosis not present

## 2015-12-11 DIAGNOSIS — Z800 Family history of malignant neoplasm of digestive organs: Secondary | ICD-10-CM | POA: Diagnosis not present

## 2015-12-11 DIAGNOSIS — I480 Paroxysmal atrial fibrillation: Secondary | ICD-10-CM | POA: Diagnosis present

## 2015-12-11 DIAGNOSIS — I472 Ventricular tachycardia: Secondary | ICD-10-CM | POA: Diagnosis present

## 2015-12-11 DIAGNOSIS — E785 Hyperlipidemia, unspecified: Secondary | ICD-10-CM | POA: Diagnosis present

## 2015-12-11 DIAGNOSIS — I083 Combined rheumatic disorders of mitral, aortic and tricuspid valves: Secondary | ICD-10-CM | POA: Diagnosis present

## 2015-12-11 DIAGNOSIS — J690 Pneumonitis due to inhalation of food and vomit: Secondary | ICD-10-CM | POA: Diagnosis present

## 2015-12-11 DIAGNOSIS — Z7982 Long term (current) use of aspirin: Secondary | ICD-10-CM | POA: Diagnosis not present

## 2015-12-11 DIAGNOSIS — G459 Transient cerebral ischemic attack, unspecified: Secondary | ICD-10-CM

## 2015-12-11 DIAGNOSIS — F17210 Nicotine dependence, cigarettes, uncomplicated: Secondary | ICD-10-CM | POA: Diagnosis present

## 2015-12-11 DIAGNOSIS — F419 Anxiety disorder, unspecified: Secondary | ICD-10-CM | POA: Diagnosis present

## 2015-12-11 DIAGNOSIS — N269 Renal sclerosis, unspecified: Secondary | ICD-10-CM | POA: Diagnosis present

## 2015-12-11 DIAGNOSIS — J449 Chronic obstructive pulmonary disease, unspecified: Secondary | ICD-10-CM | POA: Diagnosis present

## 2015-12-11 HISTORY — DX: Transient cerebral ischemic attack, unspecified: G45.9

## 2015-12-11 HISTORY — DX: Dysarthria following cerebral infarction: I69.322

## 2015-12-11 LAB — CBC WITH DIFFERENTIAL/PLATELET
Basophils Absolute: 0 10*3/uL (ref 0–0.1)
Basophils Relative: 1 %
EOS PCT: 0 %
Eosinophils Absolute: 0 10*3/uL (ref 0–0.7)
HCT: 25.8 % — ABNORMAL LOW (ref 35.0–47.0)
Hemoglobin: 8.4 g/dL — ABNORMAL LOW (ref 12.0–16.0)
LYMPHS ABS: 0.6 10*3/uL — AB (ref 1.0–3.6)
LYMPHS PCT: 10 %
MCH: 28.2 pg (ref 26.0–34.0)
MCHC: 32.7 g/dL (ref 32.0–36.0)
MCV: 86.2 fL (ref 80.0–100.0)
MONO ABS: 0.5 10*3/uL (ref 0.2–0.9)
MONOS PCT: 8 %
NEUTROS ABS: 4.9 10*3/uL (ref 1.4–6.5)
Neutrophils Relative %: 81 %
PLATELETS: 147 10*3/uL — AB (ref 150–440)
RBC: 2.99 MIL/uL — ABNORMAL LOW (ref 3.80–5.20)
RDW: 20.1 % — AB (ref 11.5–14.5)
WBC: 6 10*3/uL (ref 3.6–11.0)

## 2015-12-11 LAB — CBC
HCT: 29.1 % — ABNORMAL LOW (ref 35.0–47.0)
Hemoglobin: 9.2 g/dL — ABNORMAL LOW (ref 12.0–16.0)
MCH: 28 pg (ref 26.0–34.0)
MCHC: 31.7 g/dL — ABNORMAL LOW (ref 32.0–36.0)
MCV: 88.2 fL (ref 80.0–100.0)
PLATELETS: 150 10*3/uL (ref 150–440)
RBC: 3.3 MIL/uL — AB (ref 3.80–5.20)
RDW: 20.2 % — ABNORMAL HIGH (ref 11.5–14.5)
WBC: 6.3 10*3/uL (ref 3.6–11.0)

## 2015-12-11 LAB — COMPREHENSIVE METABOLIC PANEL
ALBUMIN: 3 g/dL — AB (ref 3.5–5.0)
ALT: 14 U/L (ref 14–54)
AST: 23 U/L (ref 15–41)
Alkaline Phosphatase: 104 U/L (ref 38–126)
Anion gap: 17 — ABNORMAL HIGH (ref 5–15)
BUN: 63 mg/dL — AB (ref 6–20)
CHLORIDE: 97 mmol/L — AB (ref 101–111)
CO2: 22 mmol/L (ref 22–32)
CREATININE: 9.58 mg/dL — AB (ref 0.44–1.00)
Calcium: 7.7 mg/dL — ABNORMAL LOW (ref 8.9–10.3)
GFR calc Af Amer: 5 mL/min — ABNORMAL LOW (ref >60)
GFR calc non Af Amer: 4 mL/min — ABNORMAL LOW (ref >60)
Glucose, Bld: 128 mg/dL — ABNORMAL HIGH (ref 65–99)
Potassium: 3.2 mmol/L — ABNORMAL LOW (ref 3.5–5.1)
SODIUM: 136 mmol/L (ref 135–145)
Total Bilirubin: 0.9 mg/dL (ref 0.3–1.2)
Total Protein: 6.2 g/dL — ABNORMAL LOW (ref 6.5–8.1)

## 2015-12-11 LAB — BASIC METABOLIC PANEL
ANION GAP: 14 (ref 5–15)
BUN: 65 mg/dL — AB (ref 6–20)
CALCIUM: 7.8 mg/dL — AB (ref 8.9–10.3)
CO2: 22 mmol/L (ref 22–32)
CREATININE: 9.71 mg/dL — AB (ref 0.44–1.00)
Chloride: 100 mmol/L — ABNORMAL LOW (ref 101–111)
GFR calc Af Amer: 5 mL/min — ABNORMAL LOW (ref >60)
GFR, EST NON AFRICAN AMERICAN: 4 mL/min — AB (ref >60)
GLUCOSE: 107 mg/dL — AB (ref 65–99)
Potassium: 3.5 mmol/L (ref 3.5–5.1)
Sodium: 136 mmol/L (ref 135–145)

## 2015-12-11 LAB — TROPONIN I
TROPONIN I: 4.79 ng/mL — AB (ref <0.031)
Troponin I: 0.25 ng/mL — ABNORMAL HIGH (ref <0.031)
Troponin I: 0.64 ng/mL — ABNORMAL HIGH (ref <0.031)
Troponin I: 3.18 ng/mL — ABNORMAL HIGH (ref <0.031)

## 2015-12-11 LAB — PROTIME-INR
INR: 1.05
Prothrombin Time: 13.9 seconds (ref 11.4–15.0)

## 2015-12-11 LAB — LACTIC ACID, PLASMA: Lactic Acid, Venous: 1.3 mmol/L (ref 0.5–2.0)

## 2015-12-11 LAB — LIPID PANEL
CHOL/HDL RATIO: 3.3 ratio
Cholesterol: 137 mg/dL (ref 0–200)
HDL: 42 mg/dL (ref >40)
LDL Cholesterol: 81 mg/dL (ref 0–99)
Triglycerides: 71 mg/dL (ref <150)
VLDL: 14 mg/dL (ref 0–40)

## 2015-12-11 LAB — MRSA PCR SCREENING: MRSA BY PCR: NEGATIVE

## 2015-12-11 MED ORDER — CLOPIDOGREL BISULFATE 75 MG PO TABS
75.0000 mg | ORAL_TABLET | Freq: Every day | ORAL | Status: DC
  Administered 2015-12-12 – 2015-12-19 (×8): 75 mg via ORAL
  Filled 2015-12-11 (×8): qty 1

## 2015-12-11 MED ORDER — POTASSIUM CHLORIDE CRYS ER 10 MEQ PO TBCR
10.0000 meq | EXTENDED_RELEASE_TABLET | Freq: Once | ORAL | Status: AC
  Administered 2015-12-11: 10 meq via ORAL
  Filled 2015-12-11: qty 1

## 2015-12-11 MED ORDER — ALBUTEROL SULFATE HFA 108 (90 BASE) MCG/ACT IN AERS: 1.0000 | INHALATION_SPRAY | Freq: Four times a day (QID) | RESPIRATORY_TRACT | Status: DC | PRN

## 2015-12-11 MED ORDER — ALPRAZOLAM 0.25 MG PO TABS
0.2500 mg | ORAL_TABLET | Freq: Three times a day (TID) | ORAL | Status: DC | PRN
  Administered 2015-12-11 – 2015-12-14 (×3): 0.25 mg via ORAL
  Filled 2015-12-11 (×3): qty 1

## 2015-12-11 MED ORDER — HEPARIN SODIUM (PORCINE) 5000 UNIT/ML IJ SOLN
5000.0000 [IU] | Freq: Three times a day (TID) | INTRAMUSCULAR | Status: DC
  Administered 2015-12-11 – 2015-12-19 (×24): 5000 [IU] via SUBCUTANEOUS
  Filled 2015-12-11 (×24): qty 1

## 2015-12-11 MED ORDER — SEVELAMER CARBONATE 800 MG PO TABS
1600.0000 mg | ORAL_TABLET | Freq: Three times a day (TID) | ORAL | Status: DC
  Administered 2015-12-12 – 2015-12-19 (×22): 1600 mg via ORAL
  Filled 2015-12-11 (×23): qty 2

## 2015-12-11 MED ORDER — SENNOSIDES-DOCUSATE SODIUM 8.6-50 MG PO TABS: 1.0000 | ORAL_TABLET | Freq: Every evening | ORAL | Status: DC | PRN

## 2015-12-11 MED ORDER — CALCIUM ACETATE (PHOS BINDER) 667 MG PO CAPS
667.0000 mg | ORAL_CAPSULE | Freq: Three times a day (TID) | ORAL | Status: DC
  Administered 2015-12-12 – 2015-12-19 (×21): 667 mg via ORAL
  Filled 2015-12-11 (×22): qty 1

## 2015-12-11 MED ORDER — ATORVASTATIN CALCIUM 20 MG PO TABS
20.0000 mg | ORAL_TABLET | Freq: Every day | ORAL | Status: DC
  Administered 2015-12-11 – 2015-12-19 (×9): 20 mg via ORAL
  Filled 2015-12-11 (×9): qty 1

## 2015-12-11 MED ORDER — GENTAMICIN SULFATE 0.1 % EX CREA
1.0000 "application " | TOPICAL_CREAM | Freq: Every day | CUTANEOUS | Status: DC
  Administered 2015-12-12 – 2015-12-18 (×4): 1 via TOPICAL
  Filled 2015-12-11 (×5): qty 15

## 2015-12-11 MED ORDER — TIOTROPIUM BROMIDE MONOHYDRATE 18 MCG IN CAPS
18.0000 ug | ORAL_CAPSULE | Freq: Every day | RESPIRATORY_TRACT | Status: DC
  Administered 2015-12-11 – 2015-12-19 (×8): 18 ug via RESPIRATORY_TRACT
  Filled 2015-12-11 (×3): qty 5

## 2015-12-11 MED ORDER — ACETAMINOPHEN 650 MG RE SUPP: 650.0000 mg | RECTAL | Status: DC | PRN

## 2015-12-11 MED ORDER — ACETAMINOPHEN 325 MG PO TABS
650.0000 mg | ORAL_TABLET | ORAL | Status: DC | PRN
  Administered 2015-12-11 – 2015-12-12 (×2): 650 mg via ORAL
  Filled 2015-12-11 (×2): qty 2

## 2015-12-11 MED ORDER — STROKE: EARLY STAGES OF RECOVERY BOOK
Freq: Once | Status: AC
  Administered 2015-12-11: 04:00:00

## 2015-12-11 MED ORDER — ALBUTEROL SULFATE (2.5 MG/3ML) 0.083% IN NEBU: 2.5000 mg | INHALATION_SOLUTION | Freq: Four times a day (QID) | RESPIRATORY_TRACT | Status: DC | PRN

## 2015-12-11 MED ORDER — METOPROLOL TARTRATE 25 MG PO TABS
25.0000 mg | ORAL_TABLET | Freq: Every day | ORAL | Status: DC
  Administered 2015-12-11: 25 mg via ORAL
  Filled 2015-12-11: qty 1

## 2015-12-11 MED ORDER — ASPIRIN EC 81 MG PO TBEC
81.0000 mg | DELAYED_RELEASE_TABLET | Freq: Every day | ORAL | Status: DC
  Administered 2015-12-11 – 2015-12-19 (×9): 81 mg via ORAL
  Filled 2015-12-11 (×9): qty 1

## 2015-12-11 MED ORDER — IOHEXOL 350 MG/ML SOLN
100.0000 mL | Freq: Once | INTRAVENOUS | Status: AC | PRN
  Administered 2015-12-11: 100 mL via INTRAVENOUS

## 2015-12-11 MED ORDER — CALCIUM ACETATE 667 MG PO TABS: 3.0000 | ORAL_TABLET | Freq: Three times a day (TID) | ORAL | Status: DC

## 2015-12-11 MED ORDER — ONDANSETRON HCL 4 MG/2ML IJ SOLN: 4.0000 mg | Freq: Four times a day (QID) | INTRAMUSCULAR | Status: DC | PRN

## 2015-12-11 MED ORDER — DELFLEX-LC/1.5% DEXTROSE 346 MOSM/L IP SOLN
INTRAPERITONEAL | Status: DC
  Administered 2015-12-12 – 2015-12-13 (×2): via INTRAPERITONEAL
  Administered 2015-12-14: 19:00:00 6500 mL via INTRAPERITONEAL
  Administered 2015-12-15: 6 L via INTRAPERITONEAL
  Filled 2015-12-11 (×5): qty 3000

## 2015-12-11 NOTE — Procedures (Signed)
Arterial Catheter Insertion Procedure Note Dorothy Long [id number] 1961/02/03  Procedure: Insertion of Arterial Catheter  Indications: Blood pressure monitoring  Procedure Details Consent: Risks of procedure as well as the alternatives and risks of each were explained to the (patient/caregiver).  Consent for procedure obtained. Time Out: Verified patient identification, verified procedure, site/side was marked, verified correct patient position, special equipment/implants available, medications/allergies/relevent history reviewed, required imaging and test results available.  Performed  Maximum sterile technique was used including antiseptics, gloves, hand hygiene, mask and sheet. Skin prep: Chlorhexidine; local anesthetic administered 20 gauge catheter was inserted into right radial artery using the Seldinger technique.  Evaluation Blood flow good; BP tracing good. Complications: No apparent complications.   Dorothy FischerDavid Falesha Long 12/11/2015

## 2015-12-11 NOTE — Progress Notes (Signed)
PT Cancellation Note  Patient Details Name: Dorothy GarretDonnella R Binz MRN: [id number] DOB: 20-Nov-1961   Cancelled Treatment:    Reason Eval/Treat Not Completed: Medical issues which prohibited therapy (Consult received and chart reviewed.  Patient noted with uptrending troponin levels (recent draw at 3.18); additional activity contraindicated at this time.  Will uphold original order with start date of 12/28 unless otherwise directed.)   Cristie Mckinney H. Manson PasseyBrown, PT, DPT, NCS 12/11/2015, 10:20 AM [phone]

## 2015-12-11 NOTE — ED Notes (Signed)
Admitting MD at bedside.

## 2015-12-11 NOTE — Progress Notes (Signed)
Pre-PD tx

## 2015-12-11 NOTE — ED Notes (Signed)
TeleNeurology call ended at this time.

## 2015-12-11 NOTE — Plan of Care (Signed)
Problem: Education: Goal: Knowledge of secondary prevention will improve Pt and family taught importance of compliance with current orders for stroke.

## 2015-12-11 NOTE — Progress Notes (Signed)
Report called to Kennyth ArnoldStacy, RN in CCU

## 2015-12-11 NOTE — Consult Note (Signed)
[facility] Vascular Consult Note  MRN : [id number]  Dorothy Long is a 54 y.o. (Dec 13, 1961) female who presents with chief complaint of  Chief Complaint  Patient presents with  . Altered Mental Status  .  History of Present Illness: The patient is a 54 y.o. female with a known history of hypertension, hyperlipidemia, COPD, end-stage renal disease on peritoneal dialysis, congestive heart failure, anemia presented to the emergency room because of difficulty in speech and syncope. Patient usually does peritoneal dialysis around 9 PM in the night every day. Yesterday she also hooked up for peritoneal dialysis, but could not complete it. Patient's husband found her on the floor with loss of consciousness. After the patient woke up, she was not able to speak and she was brought to the emergency room. The above event happened around 9 PM last night. Tele neurology consult was done in the emergency room, they recommended baby aspirin and said patient is out of the window for any thrombolytic therapy. Upon evaluation in the ER, patient is able to move all extremities. She was able to express words and talk but had slurred speech and dysarthria. No tingling or numbness in any part of the body. No history of any head injury. Patient was recently worked up for stroke the first week of December at our hospital.  Current Facility-Administered Medications  Medication Dose Route Frequency Provider Last Rate Last Dose  . acetaminophen (TYLENOL) tablet 650 mg  650 mg Oral Q4H PRN Ihor Austin, MD       Or  . acetaminophen (TYLENOL) suppository 650 mg  650 mg Rectal Q4H PRN Pavan Pyreddy, MD      . albuterol (PROVENTIL) (2.5 MG/3ML) 0.083% nebulizer solution 2.5 mg  2.5 mg Nebulization Q6H PRN Ihor Austin, MD      . ALPRAZolam Prudy Feeler) tablet 0.25 mg  0.25 mg Oral TID PRN Ihor Austin, MD   0.25 mg at 12/11/15 1148  . aspirin EC tablet 81 mg  81 mg Oral Daily Pavan Pyreddy, MD   81  mg at 12/11/15 1017  . atorvastatin (LIPITOR) tablet 20 mg  20 mg Oral Daily Pavan Pyreddy, MD   20 mg at 12/11/15 1017  . calcium acetate (PHOSLO) capsule 667 mg  667 mg Oral TID Ihor Austin, MD   667 mg at 12/11/15 1000  . [START ON 12/12/2015] clopidogrel (PLAVIX) tablet 75 mg  75 mg Oral Daily Enid Baas, MD      . dialysis solution 1.5% low-MG/low-CA dianeal solution   Intraperitoneal Q24H Munsoor Lateef, MD      . gentamicin cream (GARAMYCIN) 0.1 % 1 application  1 application Topical Daily Munsoor Lateef, MD      . heparin injection 5,000 Units  5,000 Units Subcutaneous 3 times per day Ihor Austin, MD   5,000 Units at 12/11/15 1411  . ondansetron (ZOFRAN) injection 4 mg  4 mg Intravenous Q6H PRN Pavan Pyreddy, MD      . senna-docusate (Senokot-S) tablet 1 tablet  1 tablet Oral QHS PRN Ihor Austin, MD      . sevelamer carbonate (RENVELA) tablet 1,600 mg  1,600 mg Oral TID WC Pavan Pyreddy, MD   1,600 mg at 12/11/15 0800  . tiotropium (SPIRIVA) inhalation capsule 18 mcg  18 mcg Inhalation Daily Ihor Austin, MD   18 mcg at 12/11/15 1016    Past Medical History  Diagnosis Date  . Hypertension   . Hyperlipidemia   . COPD (chronic obstructive pulmonary disease) (  HCC)     a. not on home O2  . Anemia   . Valvular disease     a. 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 echo 2015: EF 45-50%, borderline LVH, mod dilated LA, mildly dilated RA, small pericardial effusion, mod MR (rheumatic deformity), MS mild-mod, no gradient, PASP ele  . Chronic combined systolic and diastolic CHF (congestive heart failure) (HCC)     a. echo reports above  . PAF (paroxysmal atrial fibrillation) (HCC)     a. in setting of diarrhea 09/21/2013; b. not on long term full dose anticoagulation; c. CHADSVASC at least 4 (CHF, HTN,  DM, female)  . Diabetes mellitus (HCC)   . Peritoneal dialysis status (HCC) 4- 15-15  . Ventral hernia   . Inguinal hernia   . History of stress test     a. 2014: no convincing evidence of pharmacologically induced ischemia,  apparent ant apical defect present only on attenuation corrected images favored to reflect artifact due to overcorrection & may be worse on stress imaging secondary to greater patient motion. A small area of ischemia is felt less likely but is difficult to entirely exclude, EF 55%  . ESRD (end stage renal disease) on dialysis (HCC)     a. HD on T,T,S; b. 2/2 focal segmental glomerulosclerosis   . Dialysis patient (HCC)   . Renal insufficiency     Past Surgical History  Procedure Laterality Date  . Abdominal hysterectomy    . Cholecystectomy    . Knee surgery Right   . Foot surgery Bilateral   . Av fistula placement Left 10-26-13    Dr Gilda Crease  . Peritoneal catheter insertion  03-29-14    Dr Gilda Crease  . Inguinal hernia repair Left 07/13/2015    Procedure: HERNIA REPAIR INGUINAL ADULT;  Surgeon: Earline Mayotte, MD;  Location: [facility] ORS;  Service: General;  Laterality: Left;  Marland Kitchen Ventral hernia repair N/A 07/13/2015    Procedure: HERNIA REPAIR VENTRAL ADULT;  Surgeon: Earline Mayotte, MD;  Location: [facility] ORS;  Service: General;  Laterality: N/A;    Social History Social History  Substance Use Topics  . Smoking status: Current Some Day Smoker -- 0.50 packs/day for 25 years    Types: Cigarettes  . Smokeless tobacco: Never Used  . Alcohol Use: No     Comment: occasional    Family History Family History  Problem Relation Age of Onset  . Cancer Mother     colon  . Heart disease Father   . Hypertension Father    no family history of porphyria or autoimmune disease  Allergies  Allergen Reactions  . Ciprocinonide [Fluocinolone] Hives  . Demerol [Meperidine] Nausea And Vomiting  . Diflucan [Fluconazole] Nausea Only  . Flagyl [Metronidazole] Hives and  Itching  . Hydrocodone Itching  . Levaquin [Levofloxacin In D5w] Other (See Comments)    Numbness in legs  . Morphine And Related Nausea And Vomiting  . Tetracyclines & Related Itching and Swelling  . Valium [Diazepam] Nausea Only    hallucinations     REVIEW OF SYSTEMS (Negative unless checked)  Constitutional: Weight loss  Fever  Chills Cardiac: Chest pain   Chest pressure   Palpitations   Shortness of breath when laying flat   Shortness of breath at rest   Shortness of breath with exertion.  Vascular:  Pain in legs with walking   Pain in legs at rest   Pain in legs when laying flat   Claudication   Pain in feet when walking  Pain in feet at rest  Pain in feet when laying flat   History of DVT   Phlebitis   Swelling in legs   Varicose veins   Non-healing ulcers Pulmonary:   Uses home oxygen   Productive cough   Hemoptysis   Wheeze  COPD   Asthma Neurologic:  Dizziness  Blackouts   Seizures   History of stroke   History of TIA  Aphasia   Temporary blindness   Dysphagia   Weakness or numbness in arms   Weakness or numbness in legs Musculoskeletal:  Arthritis   Joint swelling   Joint pain   Low back pain Hematologic:  Easy bruising  Easy bleeding   Hypercoagulable state   Anemic  Hepatitis Gastrointestinal:  Blood in stool   Vomiting blood  Gastroesophageal reflux/heartburn   Difficulty swallowing. Genitourinary:  Chronic kidney disease   Difficult urination  Frequent urination  Burning with urination   Blood in urine Skin:  Rashes   Ulcers   Wounds Psychological:  History of anxiety    History of major depression.    Physical Examination  Filed Vitals:   12/11/15 1615 12/11/15 1700 12/11/15 1730 12/11/15 1800  BP: 134/84 122/73 126/76 136/74  Pulse:  87  81  Temp:      TempSrc:      Resp:  22  25  Height:      Weight:      SpO2:  95%  94%   Body mass  index is 19.35 kg/(m^2).  Head: [place]/AT, No temporalis wasting. Prominent temp pulse not noted. Ear/Nose/Throat: Nares w/o erythema or drainage, oropharynx w/o obsrtuction, Mallampati score: Class I.  Dentition good.  Eyes: PERRLA, Sclera nonicteric.  Neck: Supple, no nuchal rigidity.  No bruit or JVD.  Pulmonary:  Breath sounds equal bilaterally, no use of accessory muscles.  Cardiac: RRR, normal S1, S2, no Murmurs, rubs or gallops. Vascular: No carotid bruits auscultated Gastrointestinal: soft, non-tender, non-distended.  Musculoskeletal: Moves only left extremities.  No deformity or atrophy. No edema. Neurologic: Asymmetrical right side hemiparesis.  Speech is a phasic.  Psychiatric:  Mood & affect appropriate for pt's clinical situation. Dermatologic: No rashes or ulcers noted.  No cellulitis or open wounds. Lymph : No Cervical,  or Inguinal lymphadenopathy.      CBC Lab Results  Component Value Date   WBC 6.0 12/11/2015   HGB 8.4* 12/11/2015   HCT 25.8* 12/11/2015   MCV 86.2 12/11/2015   PLT 147* 12/11/2015    BMET    Component Value Date/Time   NA 136 12/11/2015 0452   NA 135* 11/15/2014 1439   NA 135* 11/15/2014 0239   K 3.5 12/11/2015 0452   K 3.7 11/15/2014 1439   CL 100* 12/11/2015 0452   CL 102 11/15/2014 1439   CO2 22 12/11/2015 0452   CO2 16* 11/15/2014 1439   GLUCOSE 107* 12/11/2015 0452   GLUCOSE 138* 11/15/2014 1439   BUN 65* 12/11/2015 0452   BUN 75* 11/15/2014 1439   BUN 75* 11/15/2014 0239   CREATININE 9.71* 12/11/2015 0452   CREATININE 8.87* 11/15/2014 1439   CREATININE 8.9* 11/15/2014 0239   CALCIUM 7.8* 12/11/2015 0452   CALCIUM 6.1* 11/15/2014 1439   GFRNONAA 4* 12/11/2015 0452   GFRNONAA 5* 11/15/2014 1439   GFRNONAA 8* 07/09/2014  0159   GFRAA 5* 12/11/2015 0452   GFRAA 6* 11/15/2014 1439   GFRAA 9* 07/09/2014 0159   Estimated Creatinine Clearance: 5.5 mL/min (by C-G formula based on Cr of 9.71).  COAG Lab Results  Component Value  Date   INR 1.05 12/10/2015   INR 1.13 11/17/2015   INR 1.10 06/14/2015    Assessment/Plan 1. acute CVA-this is suspected diagnosis given patient's right hemiparesis and aphasia. I personally reviewed the CT angiogram from this evening. There is minimal atherosclerotic occlusion at the bifurcations of the carotid arteries bilaterally. 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 This extends essentially from the origin of the internal carotid artery to the circle of Willis. It does not appear in my estimation to be atherosclerotic in nature. Given this finding maximal medical therapy is warranted surgical intervention is not indicated  2. end-stage renal disease on peritoneal dialysis-continue care as per nephrology.  3. secondary hyperparathyroidism-continue Renvela, Sensipar.  4. anxiety-continue Xanax.  5. COPD-no acute exacerbation. Continue Spiriva.   Alexius Hangartner, Latina CraverGregory G, MD  12/11/2015 6:53 PM

## 2015-12-11 NOTE — Progress Notes (Signed)
   12/11/15 0056  Clinical Encounter Type  Visited With Patient and family together  Visit Type Initial  Referral From Nurse  Consult/Referral To Chaplain  Spiritual Encounters  Spiritual Needs Prayer  Stress Factors  Patient Stress Factors Health changes  Chaplain responded to code and offered prayer for family.  Fisher ScientificChaplain Raylyn Speckman [phone]

## 2015-12-11 NOTE — Consult Note (Addendum)
CC: aphasia   HPI: TERICA YOGI is an 54 y.o. female a known history of hypertension, hyperlipidemia, COPD, end-stage renal disease on peritoneal dialysis, congestive heart failure, anemia presented to the emergency room because of difficulty in speech and syncope.  As per family they heart a thud after which pt was dysarthric and had R sided weakness.  Past Medical History  Diagnosis Date  . Hypertension   . Hyperlipidemia   . COPD (chronic obstructive pulmonary disease) (HCC)     a. not on home O2  . Anemia   . Valvular disease     a. 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 echo 2015: EF 45-50%, borderline LVH, mod dilated LA, mildly dilated RA, small pericardial effusion, mod MR (rheumatic deformity), MS mild-mod, no gradient, PASP ele  . Chronic combined systolic and diastolic CHF (congestive heart failure) (HCC)     a. echo reports above  . PAF (paroxysmal atrial fibrillation) (HCC)     a. in setting of diarrhea 09/21/2013; b. not on long term full dose anticoagulation; c. CHADSVASC at least 4 (CHF, HTN, DM, female)  . Diabetes mellitus (HCC)   . Peritoneal dialysis status (HCC) 4- 15-15  . Ventral hernia   . Inguinal hernia   . History of stress test     a. 2014: no convincing evidence of pharmacologically induced ischemia,  apparent ant apical defect present only on attenuation corrected images favored to reflect artifact due to overcorrection & may be worse on stress imaging secondary to greater patient motion. A small area of ischemia is felt less likely but is difficult to entirely exclude, EF 55%  . ESRD (end stage renal disease) on dialysis (HCC)     a. HD on T,T,S; b. 2/2 focal segmental glomerulosclerosis   . Dialysis patient [facility])     Past Surgical History  Procedure Laterality Date  .  Abdominal hysterectomy    . Cholecystectomy    . Knee surgery Right   . Foot surgery Bilateral   . Av fistula placement Left 10-26-13    Dr Gilda Crease  . Peritoneal catheter insertion  03-29-14    Dr Gilda Crease  . Inguinal hernia repair Left 07/13/2015    Procedure: HERNIA REPAIR INGUINAL ADULT;  Surgeon: Earline Mayotte, MD;  Location: [facility] ORS;  Service: General;  Laterality: Left;  Marland Kitchen Ventral hernia repair N/A 07/13/2015    Procedure: HERNIA REPAIR VENTRAL ADULT;  Surgeon: Earline Mayotte, MD;  Location: [facility] ORS;  Service: General;  Laterality: N/A;    Family History  Problem Relation Age of Onset  . Cancer Mother     colon  . Heart disease Father   . Hypertension Father     Social History:  reports that she has been smoking Cigarettes.  She has a 12.5 pack-year smoking history. She has never used smokeless tobacco. She reports that she does not drink alcohol or use illicit drugs.  Allergies  Allergen Reactions  . Ciprocinonide [Fluocinolone] Hives  . Demerol [Meperidine] Nausea And Vomiting  . Diflucan [Fluconazole] Nausea Only  . Flagyl [Metronidazole] Hives and Itching  . Hydrocodone Itching  . Levaquin [Levofloxacin In D5w] Other (See Comments)    Numbness in legs  . Morphine And Related Nausea And Vomiting  . Tetracyclines & Related Itching and Swelling  . Valium [Diazepam]  Nausea Only    hallucinations    Medications: I have reviewed the patient's current medications.  ROS: Unable to obtain due to dysarthria  Physical Examination: Blood pressure 118/87, pulse 81, temperature 97.8 F (36.6 C), temperature source Oral, resp. rate 18, height 5\' 4"  (1.626 m), weight 112 lb 12.8 oz (51.166 kg), SpO2 97 %.   Neurological Examination Mental Status: Aphasic  Cranial Nerves: II: Discs flat bilaterally; Visual fields grossly normal, pupils equal, round, reactive to light and accommodation III,IV, VI: ptosis not present, extra-ocular motions intact bilaterally V,VII:  smile symmetric, facial light touch sensation normal bilaterally VIII: hearing normal bilaterally IX,X: gag reflex present XI: bilateral shoulder shrug XII: midline tongue extension Motor: Right : Upper extremity   4/5    Left:     Upper extremity   4/5  Lower extremity   3/5     Lower extremity   3/5 Tone and bulk:normal tone throughout; no atrophy noted Sensory: Pinprick and light touch intact throughout, bilaterally Deep Tendon Reflexes: 2+ and symmetric throughout Plantars: Right: downgoing   Left: downgoing Cerebellar: Not tested  Gait: not tested       Laboratory Studies:   Basic Metabolic Panel:  Recent Labs Lab 12/10/15 2343 12/11/15 0452  NA 136 136  K 3.2* 3.5  CL 97* 100*  CO2 22 22  GLUCOSE 128* 107*  BUN 63* 65*  CREATININE 9.58* 9.71*  CALCIUM 7.7* 7.8*    Liver Function Tests:  Recent Labs Lab 12/10/15 2343  AST 23  ALT 14  ALKPHOS 104  BILITOT 0.9  PROT 6.2*  ALBUMIN 3.0*   No results for input(s): LIPASE, AMYLASE in the last 168 hours. No results for input(s): AMMONIA in the last 168 hours.  CBC:  Recent Labs Lab 12/10/15 2343 12/11/15 0452  WBC 6.3 6.0  NEUTROABS  --  4.9  HGB 9.2* 8.4*  HCT 29.1* 25.8*  MCV 88.2 86.2  PLT 150 147*    Cardiac Enzymes:  Recent Labs Lab 12/10/15 2343 12/11/15 0045 12/11/15 0854  TROPONINI 0.25* 0.64* 3.18*    BNP: Invalid input(s): POCBNP  CBG: No results for input(s): GLUCAP in the last 168 hours.  Microbiology: Results for orders placed or performed during the hospital encounter of 10/11/15  Surgical pcr screen     Status: None   Collection Time: 10/11/15  7:33 PM  Result Value Ref Range Status   MRSA, PCR NEGATIVE NEGATIVE Final   Staphylococcus aureus NEGATIVE NEGATIVE Final    Comment:        The Xpert SA Assay (FDA approved for NASAL specimens in patients over 76 years of age), is one component of a comprehensive surveillance program.  Test performance has been  validated by [facility] for patients greater than or equal to 76 year old. It is not intended to diagnose infection nor to guide or monitor treatment.     Coagulation Studies:  Recent Labs  12/10/15 2344  LABPROT 13.9  INR 1.05    Urinalysis: No results for input(s): COLORURINE, LABSPEC, PHURINE, GLUCOSEU, HGBUR, BILIRUBINUR, KETONESUR, PROTEINUR, UROBILINOGEN, NITRITE, LEUKOCYTESUR in the last 168 hours.  Invalid input(s): APPERANCEUR  Lipid Panel:     Component Value Date/Time   CHOL 137 12/11/2015 0452   CHOL 120 05/03/2014 1816   TRIG 71 12/11/2015 0452   TRIG 120 05/03/2014 1816   HDL 42 12/11/2015 0452   HDL 44 05/03/2014 1816   CHOLHDL 3.3 12/11/2015 0452   VLDL 14 12/11/2015 0452  VLDL 24 05/03/2014 1816   LDLCALC 81 12/11/2015 0452   LDLCALC 52 05/03/2014 1816    HgbA1C: No results found for: HGBA1C  Urine Drug Screen:     Component Value Date/Time   LABOPIA NONE DETECTED 11/18/2015 1122   LABBENZ NONE DETECTED 11/18/2015 1122   AMPHETMU NONE DETECTED 11/18/2015 1122   THCU NONE DETECTED 11/18/2015 1122   LABBARB NONE DETECTED 11/18/2015 1122    Alcohol Level: No results for input(s): ETH in the last 168 hours.  Other results: EKG: normal EKG, normal sinus rhythm, unchanged from previous tracings.  Imaging: Ct Head Wo Contrast  12/11/2015  CLINICAL DATA:  Larey Seat in bathroom. Slurred speech and altered mental status. History of hypertension, hyperlipidemia, and diabetes, end-stage renal disease on dialysis. EXAM: CT HEAD WITHOUT CONTRAST TECHNIQUE: Contiguous axial images were obtained from the base of the skull through the vertex without intravenous contrast. COMPARISON:  MRI of the brain November 17, 2015 FINDINGS: The ventricles and sulci are normal. No intraparenchymal hemorrhage, mass effect nor midline shift. No acute large vascular territory infarcts. Small area RIGHT frontal encephalomalacia. No abnormal extra-axial fluid collections. Basal  cisterns are patent. A few tiny extra-axial punctate calcifications. No skull fracture. The included ocular globes and orbital contents are non-suspicious. The mastoid aircells and included paranasal sinuses are well-aerated. IMPRESSION: No acute intracranial process. Focal RIGHT frontal encephalomalacia may be posttraumatic or ischemic. Punctate extra-axial calcifications could be vascular. Electronically Signed   By: Awilda Metro M.D.   On: 12/11/2015 00:09   Mr Brain Wo Contrast  12/11/2015  CLINICAL DATA:  Patient with atrial fibrillation not on long-term anticoagulation, also end-stage renal disease, hypertension, and diabetes, presents with disorder of speech 12/10/2015. Also Syncopal episode. EXAM: MRI HEAD WITHOUT CONTRAST MRA HEAD WITHOUT CONTRAST TECHNIQUE: Multiplanar, multiecho pulse sequences of the brain and surrounding structures were obtained without intravenous contrast. Angiographic images of the head were obtained using MRA technique without contrast. COMPARISON:  MR head 11/17/2015.  CT head 12/10/2015. FINDINGS: MRI HEAD FINDINGS Multifocal areas of acute infarction affect the LEFT hemisphere, including LEFT frontal, temporal, parietal, and occipital lobes, LEFT caudate nucleus and lentiform nucleus, and regional subcortical greater than periventricular white matter. No posterior fossa involvement. No RIGHT hemisphere involvement. These could be related to multiple emboli from the diseased LEFT ICA described below, or watershed type phenomenon from hypoperfusion. Normal for age cerebral volume. Paucity of white matter disease given patient's comorbidities. Remote RIGHT posterior frontal and subcortical infarction. No significant chronic hemorrhage. No midline abnormality. Extracranial soft tissues unremarkable. No osseous findings MRA HEAD FINDINGS The RIGHT internal carotid artery is widely patent. The basilar artery is widely patent with RIGHT vertebral dominant. There is a 50%  stenosis of the non dominant LEFT vertebral at its junction with basilar. The RIGHT M1 MCA is widely patent. Minor non stenotic narrowing of the proximal RIGHT A1 ACA. The anterior communicating artery poorly seen. There is apparent cross fill right-to-left supplying the anterior circulation. There is some flow related enhancement of the LEFT ICA in its cavernous and supraclinoid segment, but there is diminished to absent petrous LEFT ICA flow. Tapering flow related enhancement is observed in a small but patent LEFT cervical ICA. LEFT M1 MCA small but patent. Severe stenosis proximal A2 LEFT ACA. Severely diminished flow related enhancement in the LEFT M2 and M3 vessels. No PCA stenosis or occlusion. No cerebellar branch occlusion. No intracranial aneurysm. Compared with the prior MRI from 11/17/2015, the infarctions are new, and the LEFT  ICA appears to be further diminished caliber. IMPRESSION: Multifocal areas of acute infarction affecting the LEFT hemisphere without visible hemorrhage. It is unclear if these represent multiple emboli or watershed type phenomenon. No posterior fossa or RIGHT hemisphere involvement. Segmental areas of diminished or absent flow related enhancement in the LEFT ICA. String sign is suspected. Formal catheter angiogram recommended for further evaluation to potentially re-establish flow in a diseased vessel. Critical Value/emergent results were called by telephone at the time of interpretation on 12/11/2015 at 1:49 pm to Dr. Nemiah Commander , who verbally acknowledged these results. Electronically Signed   By: Elsie Stain M.D.   On: 12/11/2015 13:51   Mr Maxine Glenn Head/brain Wo Cm  12/11/2015  CLINICAL DATA:  Patient with atrial fibrillation not on long-term anticoagulation, also end-stage renal disease, hypertension, and diabetes, presents with disorder of speech 12/10/2015. Also Syncopal episode. EXAM: MRI HEAD WITHOUT CONTRAST MRA HEAD WITHOUT CONTRAST TECHNIQUE: Multiplanar, multiecho  pulse sequences of the brain and surrounding structures were obtained without intravenous contrast. Angiographic images of the head were obtained using MRA technique without contrast. COMPARISON:  MR head 11/17/2015.  CT head 12/10/2015. FINDINGS: MRI HEAD FINDINGS Multifocal areas of acute infarction affect the LEFT hemisphere, including LEFT frontal, temporal, parietal, and occipital lobes, LEFT caudate nucleus and lentiform nucleus, and regional subcortical greater than periventricular white matter. No posterior fossa involvement. No RIGHT hemisphere involvement. These could be related to multiple emboli from the diseased LEFT ICA described below, or watershed type phenomenon from hypoperfusion. Normal for age cerebral volume. Paucity of white matter disease given patient's comorbidities. Remote RIGHT posterior frontal and subcortical infarction. No significant chronic hemorrhage. No midline abnormality. Extracranial soft tissues unremarkable. No osseous findings MRA HEAD FINDINGS The RIGHT internal carotid artery is widely patent. The basilar artery is widely patent with RIGHT vertebral dominant. There is a 50% stenosis of the non dominant LEFT vertebral at its junction with basilar. The RIGHT M1 MCA is widely patent. Minor non stenotic narrowing of the proximal RIGHT A1 ACA. The anterior communicating artery poorly seen. There is apparent cross fill right-to-left supplying the anterior circulation. There is some flow related enhancement of the LEFT ICA in its cavernous and supraclinoid segment, but there is diminished to absent petrous LEFT ICA flow. Tapering flow related enhancement is observed in a small but patent LEFT cervical ICA. LEFT M1 MCA small but patent. Severe stenosis proximal A2 LEFT ACA. Severely diminished flow related enhancement in the LEFT M2 and M3 vessels. No PCA stenosis or occlusion. No cerebellar branch occlusion. No intracranial aneurysm. Compared with the prior MRI from 11/17/2015, the  infarctions are new, and the LEFT ICA appears to be further diminished caliber. IMPRESSION: Multifocal areas of acute infarction affecting the LEFT hemisphere without visible hemorrhage. It is unclear if these represent multiple emboli or watershed type phenomenon. No posterior fossa or RIGHT hemisphere involvement. Segmental areas of diminished or absent flow related enhancement in the LEFT ICA. String sign is suspected. Formal catheter angiogram recommended for further evaluation to potentially re-establish flow in a diseased vessel. Critical Value/emergent results were called by telephone at the time of interpretation on 12/11/2015 at 1:49 pm to Dr. Nemiah Commander , who verbally acknowledged these results. Electronically Signed   By: Elsie Stain M.D.   On: 12/11/2015 13:51     Assessment/Plan:   54 y.o. female a known history of hypertension, hyperlipidemia, COPD, end-stage renal disease on peritoneal dialysis, congestive heart failure, anemia presented to the emergency room because of difficulty in  speech and syncope.  As per family they heart a thud after which pt was dysarthric and had R sided weakness.   There is a possible history of A-fib  MRI L cereblal infarctions in the MCA/PCA, ACA/MCA territory. On MRA don't see flow through L ICA  - needs CTA neck to see if there is any flow - vascular evalution - con't anti platelet therapy will likely need dual anti platelet therapy after CTA and vascular evaluation  - SBP no less then 160, I suspect pt was in bathroom, did bear down and decreased afterload dropping pressure. There is likely string sign L ICA.   - US 3 weeks ago on ICA showed 50% stenosis.   Will d/w family.  Pauletta BrownsZEYLIKMAN, Floyd Lusignan    12/11/2015, 2:18 PM

## 2015-12-11 NOTE — Evaluation (Signed)
Clinical/Bedside Swallow Evaluation Patient Details  Name: Dorothy Long MRN: [id number] Date of Birth: 1961/04/10  Today's Date: 12/11/2015 Time: SLP Start Time (ACUTE ONLY): 0900 SLP Stop Time (ACUTE ONLY): 0945 SLP Time Calculation (min) (ACUTE ONLY): 45 min  Past Medical History:  Past Medical History  Diagnosis Date  . Hypertension   . Hyperlipidemia   . COPD (chronic obstructive pulmonary disease) (HCC)     a. not on home O2  . Anemia   . Valvular disease     a. 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 echo 2015: EF 45-50%, borderline LVH, mod dilated LA, mildly dilated RA, small pericardial effusion, mod MR (rheumatic deformity), MS mild-mod, no gradient, PASP ele  . Chronic combined systolic and diastolic CHF (congestive heart failure) (HCC)     a. echo reports above  . PAF (paroxysmal atrial fibrillation) (HCC)     a. in setting of diarrhea 09/21/2013; b. not on long term full dose anticoagulation; c. CHADSVASC at least 4 (CHF, HTN, DM, female)  . Diabetes mellitus (HCC)   . Peritoneal dialysis status (HCC) 4- 15-15  . Ventral hernia   . Inguinal hernia   . History of stress test     a. 2014: no convincing evidence of pharmacologically induced ischemia,  apparent ant apical defect present only on attenuation corrected images favored to reflect artifact due to overcorrection & may be worse on stress imaging secondary to greater patient motion. A small area of ischemia is felt less likely but is difficult to entirely exclude, EF 55%  . ESRD (end stage renal disease) on dialysis (HCC)     a. HD on T,T,S; b. 2/2 focal segmental glomerulosclerosis   . Dialysis patient [facility])    Past Surgical History:  Past Surgical History  Procedure Laterality Date  . Abdominal hysterectomy    . Cholecystectomy     . Knee surgery Right   . Foot surgery Bilateral   . Av fistula placement Left 10-26-13    Dr Gilda Crease  . Peritoneal catheter insertion  03-29-14    Dr Gilda Crease  . Inguinal hernia repair Left 07/13/2015    Procedure: HERNIA REPAIR INGUINAL ADULT;  Surgeon: Earline Mayotte, MD;  Location: [facility] ORS;  Service: General;  Laterality: Left;  Marland Kitchen Ventral hernia repair N/A 07/13/2015    Procedure: HERNIA REPAIR VENTRAL ADULT;  Surgeon: Earline Mayotte, MD;  Location: [facility] ORS;  Service: General;  Laterality: N/A;   HPI:      Assessment / Plan / Recommendation Clinical Impression  pt presents with a mild oral dysphagia charactorized by lengthy mastication and slowed oral transit time. pt generally confused and appears to not be at baseline cognitivly according to md and family. pt recommended to start a dysphagia 2 with thin liquid diet.  pt also noted to have poor dentition with multiple missing teeth. The teeth present do not appeara dequate for mastication of hard solids.     Aspiration Risk  Mild aspiration risk    Diet Recommendation Dysphagia 2 (Fine chop);Thin liquid   Liquid Administration via: Straw;Cup Medication Administration: Whole meds with puree Supervision: Patient able to self feed Postural Changes: Seated upright at 90 degrees    Other  Recommendations Oral Care Recommendations: Oral care BID   Follow up Recommendations  Frequency and Duration min 3x week  1 week       Prognosis Prognosis for Safe Diet Advancement: Good Barriers to Reach Goals: Cognitive deficits      Swallow Study   General Date of Onset: 12/11/15 Type of Study: Bedside Swallow Evaluation Diet Prior to this Study: NPO Temperature Spikes Noted: N/A Respiratory Status: Room air History of Recent Intubation: No Behavior/Cognition: Confused;Agitated Oral Cavity Assessment: Within Functional Limits Oral Care Completed by SLP: No Oral Cavity - Dentition: Poor condition Vision: Functional  for self-feeding Self-Feeding Abilities: Able to feed self Patient Positioning: Upright in bed Baseline Vocal Quality: Normal Volitional Cough: Strong Volitional Swallow: Able to elicit    Oral/Motor/Sensory Function Overall Oral Motor/Sensory Function: Within functional limits   Ice Chips Ice chips: Within functional limits   Thin Liquid Thin Liquid: Within functional limits Presentation: Cup;Spoon;Straw;Self Fed    Nectar Thick Nectar Thick Liquid: Not tested   Honey Thick Honey Thick Liquid: Not tested   Puree Puree: Within functional limits Presentation: Self Fed;Spoon   Solid Solid: Within functional limits Presentation: Self Fed;Spoon Other Comments: Due to reduced cognitive status and dentition soft trials only given       Terese Heier Harris Sauber 12/11/2015,11:44 AM

## 2015-12-11 NOTE — Progress Notes (Signed)
Dunn PA and Dr Kirke CorinArida notified of pt's troponin levels increased to 4.79. Pt now has new arterial line placed by Dr Sung AmabileSimonds. No ss of distress noted post procedure, VS as charted.

## 2015-12-11 NOTE — Progress Notes (Signed)
Ryan ,PA made aware of elevatead troponin of 3.18

## 2015-12-11 NOTE — Progress Notes (Signed)
PT Cancellation Note  Patient Details Name: Dorothy Long MRN: [id number] DOB: Jul 17, 1961   Cancelled Treatment:    Reason Eval/Treat Not Completed: Medical issues which prohibited therapy (Patient now noted with transfer to CCU for arterial line placement; MRI positive for CVA (with possible string sign) and suspected stensosis of L ICA; troponin continues to trend upwards.  Will continue hold at this time until patient medically cleared for participation with therapy. Of note, due to transfer to CCU, patient will require new orders to initiate PT; please place new order as medically appropriate. )  [name], PT, DPT, NCS 12/11/2015, 4:48 PM [phone]

## 2015-12-11 NOTE — Evaluation (Signed)
Clinical/Bedside Swallow Evaluation Patient Details  Name: Dorothy Long MRN: [id number] Date of Birth: 13-Feb-1961  Today's Date: 12/11/2015 Time: SLP Start Time (ACUTE ONLY): 0900 SLP Stop Time (ACUTE ONLY): 0945 SLP Time Calculation (min) (ACUTE ONLY): 45 min  Past Medical History:  Past Medical History  Diagnosis Date  . Hypertension   . Hyperlipidemia   . COPD (chronic obstructive pulmonary disease) (HCC)     a. not on home O2  . Anemia   . Valvular disease     a. 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 echo 2015: EF 45-50%, borderline LVH, mod dilated LA, mildly dilated RA, small pericardial effusion, mod MR (rheumatic deformity), MS mild-mod, no gradient, PASP ele  . Chronic combined systolic and diastolic CHF (congestive heart failure) (HCC)     a. echo reports above  . PAF (paroxysmal atrial fibrillation) (HCC)     a. in setting of diarrhea 09/21/2013; b. not on long term full dose anticoagulation; c. CHADSVASC at least 4 (CHF, HTN, DM, female)  . Diabetes mellitus (HCC)   . Peritoneal dialysis status (HCC) 4- 15-15  . Ventral hernia   . Inguinal hernia   . History of stress test     a. 2014: no convincing evidence of pharmacologically induced ischemia,  apparent ant apical defect present only on attenuation corrected images favored to reflect artifact due to overcorrection & may be worse on stress imaging secondary to greater patient motion. A small area of ischemia is felt less likely but is difficult to entirely exclude, EF 55%  . ESRD (end stage renal disease) on dialysis (HCC)     a. HD on T,T,S; b. 2/2 focal segmental glomerulosclerosis   . Dialysis patient [facility])    Past Surgical History:  Past Surgical History  Procedure Laterality Date  . Abdominal hysterectomy    . Cholecystectomy     . Knee surgery Right   . Foot surgery Bilateral   . Av fistula placement Left 10-26-13    Dr Gilda Crease  . Peritoneal catheter insertion  03-29-14    Dr Gilda Crease  . Inguinal hernia repair Left 07/13/2015    Procedure: HERNIA REPAIR INGUINAL ADULT;  Surgeon: Earline Mayotte, MD;  Location: [facility] ORS;  Service: General;  Laterality: Left;  Marland Kitchen Ventral hernia repair N/A 07/13/2015    Procedure: HERNIA REPAIR VENTRAL ADULT;  Surgeon: Earline Mayotte, MD;  Location: [facility] ORS;  Service: General;  Laterality: N/A;   HPI:      Assessment / Plan / Recommendation Clinical Impression  pt presents with a mild oral dysphagia charactorized by lengthy mastication and slowed oral transit time. pt generally confused and appears to not be at baseline cognitivly according to md and family. pt recommended to start a dysphagia 2 with thin liquid diet.  pt also noted to have poor dentition with multiple missing teeth. The teeth present do not appeara dequate for mastication of hard solids.     Aspiration Risk  Mild aspiration risk    Diet Recommendation Dysphagia 2 (Fine chop);Thin liquid   Liquid Administration via: Straw;Cup Medication Administration: Whole meds with puree Supervision: Patient able to self feed Postural Changes: Seated upright at 90 degrees    Other  Recommendations Oral Care Recommendations: Oral care BID   Follow up Recommendations  Frequency and Duration min 3x week  1 week       Prognosis Prognosis for Safe Diet Advancement: Good Barriers to Reach Goals: Cognitive deficits      Swallow Study   General Date of Onset: 12/11/15 Type of Study: Bedside Swallow Evaluation Diet Prior to this Study: NPO Temperature Spikes Noted: N/A Respiratory Status: Room air History of Recent Intubation: No Behavior/Cognition: Confused;Agitated Oral Cavity Assessment: Within Functional Limits Oral Care Completed by SLP: No Oral Cavity - Dentition: Poor condition Vision: Functional  for self-feeding Self-Feeding Abilities: Able to feed self Patient Positioning: Upright in bed Baseline Vocal Quality: Normal Volitional Cough: Strong Volitional Swallow: Able to elicit    Oral/Motor/Sensory Function Overall Oral Motor/Sensory Function: Within functional limits   Ice Chips Ice chips: Within functional limits   Thin Liquid Thin Liquid: Within functional limits Presentation: Cup;Spoon;Straw;Self Fed    Nectar Thick Nectar Thick Liquid: Not tested   Honey Thick Honey Thick Liquid: Not tested   Puree Puree: Within functional limits Presentation: Self Fed;Spoon   Solid Solid: Within functional limits Presentation: Self Fed;Spoon Other Comments: Due to reduced cognitive status and dentition soft trials only given       Meredith PelStacie Harris Sauber 12/11/2015,11:33 AM

## 2015-12-11 NOTE — Progress Notes (Signed)
[facility] [place] [facility]   PATIENT NAME: Dorothy Long    MR#:  [id number]  DATE OF BIRTH:  [birth year]  SUBJECTIVE:  CHIEF COMPLAINT:   Chief Complaint  Patient presents with  . Altered Mental Status   - Patient admitted secondary to altered mental status. Aphasic now, also has right-sided weakness. -At baseline, patient is very communicative, alert and ambulates with a walker  REVIEW OF SYSTEMS:  Review of Systems  Unable to perform ROS: patient nonverbal  Dysarthria  DRUG ALLERGIES:   Allergies  Allergen Reactions  . Ciprocinonide [Fluocinolone] Hives  . Demerol [Meperidine] Nausea And Vomiting  . Diflucan [Fluconazole] Nausea Only  . Flagyl [Metronidazole] Hives and Itching  . Hydrocodone Itching  . Levaquin [Levofloxacin In D5w] Other (See Comments)    Numbness in legs  . Morphine And Related Nausea And Vomiting  . Tetracyclines & Related Itching and Swelling  . Valium [Diazepam] Nausea Only    hallucinations    VITALS:  Blood pressure 118/87, pulse 81, temperature 97.8 F (36.6 C), temperature source Oral, resp. rate 18, height  (1.626 m), weight 51.166 kg (112 lb 12.8 oz), SpO2 97 %.  PHYSICAL EXAMINATION:  Physical Exam  GENERAL:  54 y.o.-year-old patient lying in the bed with no acute distress.  EYES: Pupils equal, round, reactive to light and accommodation. No scleral icterus. Extraocular muscles intact.  HEENT: Head atraumatic, normocephalic. Oropharynx and nasopharynx clear.  NECK:  Supple, no jugular venous distention. No thyroid enlargement, no tenderness.  LUNGS: Normal breath sounds bilaterally, no wheezing, rales,rhonchi or crepitation. No use of accessory muscles of respiration.  CARDIOVASCULAR: S1, S2 normal. No  rubs, or gallops. 3/6 systolic murmur is present ABDOMEN: Soft, nontender, nondistended. Bowel sounds present. No organomegaly or mass.  EXTREMITIES: No pedal edema, cyanosis, or clubbing.   NEUROLOGIC: FAC and noted, speech is not understandable. Right upper extremity strength is 4 x 5, right lower extremity and left upper and lower extremity strength is 5/5.  2+ deep tendon reflexes. Sensation intact. Gait not checked.  PSYCHIATRIC: The patient is alert. Able to test orientation due to her aphasia.  SKIN: No obvious rash, lesion, or ulcer.    LABORATORY PANEL:   CBC  Recent Labs Lab 12/11/15 0452  WBC 6.0  HGB 8.4*  HCT 25.8*  PLT 147*   ------------------------------------------------------------------------------------------------------------------  Chemistries   Recent Labs Lab 12/10/15 2343 12/11/15 0452  NA 136 136  K 3.2* 3.5  CL 97* 100*  CO2 22 22  GLUCOSE 128* 107*  BUN 63* 65*  CREATININE 9.58* 9.71*  CALCIUM 7.7* 7.8*  AST 23  --   ALT 14  --   ALKPHOS 104  --   BILITOT 0.9  --    ------------------------------------------------------------------------------------------------------------------  Cardiac Enzymes  Recent Labs Lab 12/11/15 0854  TROPONINI 3.18*   ------------------------------------------------------------------------------------------------------------------  RADIOLOGY:  Ct Head Wo Contrast  12/11/2015  CLINICAL DATA:  Larey Seat in bathroom. Slurred speech and altered mental status. History of hypertension, hyperlipidemia, and diabetes, end-stage renal disease on dialysis. EXAM: CT HEAD WITHOUT CONTRAST TECHNIQUE: Contiguous axial images were obtained from the base of the skull through the vertex without intravenous contrast. COMPARISON:  MRI of the brain November 17, 2015 FINDINGS: The ventricles and sulci are normal. No intraparenchymal hemorrhage, mass effect nor midline shift. No acute large vascular territory infarcts. Small area RIGHT frontal encephalomalacia. No abnormal extra-axial fluid collections. Basal cisterns are patent. A few tiny extra-axial punctate calcifications. No skull fracture.  The included ocular globes  and orbital contents are non-suspicious. The mastoid aircells and included paranasal sinuses are well-aerated. IMPRESSION: No acute intracranial process. Focal RIGHT frontal encephalomalacia may be posttraumatic or ischemic. Punctate extra-axial calcifications could be vascular. Electronically Signed   By: Awilda Metro M.D.   On: 12/11/2015 00:09   Mr Brain Wo Contrast  12/11/2015  CLINICAL DATA:  Patient with atrial fibrillation not on long-term anticoagulation, also end-stage renal disease, hypertension, and diabetes, presents with disorder of speech 12/10/2015. Also Syncopal episode. EXAM: MRI HEAD WITHOUT CONTRAST MRA HEAD WITHOUT CONTRAST TECHNIQUE: Multiplanar, multiecho pulse sequences of the brain and surrounding structures were obtained without intravenous contrast. Angiographic images of the head were obtained using MRA technique without contrast. COMPARISON:  MR head 11/17/2015.  CT head 12/10/2015. FINDINGS: MRI HEAD FINDINGS Multifocal areas of acute infarction affect the LEFT hemisphere, including LEFT frontal, temporal, parietal, and occipital lobes, LEFT caudate nucleus and lentiform nucleus, and regional subcortical greater than periventricular white matter. No posterior fossa involvement. No RIGHT hemisphere involvement. These could be related to multiple emboli from the diseased LEFT ICA described below, or watershed type phenomenon from hypoperfusion. Normal for age cerebral volume. Paucity of white matter disease given patient's comorbidities. Remote RIGHT posterior frontal and subcortical infarction. No significant chronic hemorrhage. No midline abnormality. Extracranial soft tissues unremarkable. No osseous findings MRA HEAD FINDINGS The RIGHT internal carotid artery is widely patent. The basilar artery is widely patent with RIGHT vertebral dominant. There is a 50% stenosis of the non dominant LEFT vertebral at its junction with basilar. The RIGHT M1 MCA is widely patent. Minor non  stenotic narrowing of the proximal RIGHT A1 ACA. The anterior communicating artery poorly seen. There is apparent cross fill right-to-left supplying the anterior circulation. There is some flow related enhancement of the LEFT ICA in its cavernous and supraclinoid segment, but there is diminished to absent petrous LEFT ICA flow. Tapering flow related enhancement is observed in a small but patent LEFT cervical ICA. LEFT M1 MCA small but patent. Severe stenosis proximal A2 LEFT ACA. Severely diminished flow related enhancement in the LEFT M2 and M3 vessels. No PCA stenosis or occlusion. No cerebellar branch occlusion. No intracranial aneurysm. Compared with the prior MRI from 11/17/2015, the infarctions are new, and the LEFT ICA appears to be further diminished caliber. IMPRESSION: Multifocal areas of acute infarction affecting the LEFT hemisphere without visible hemorrhage. It is unclear if these represent multiple emboli or watershed type phenomenon. No posterior fossa or RIGHT hemisphere involvement. Segmental areas of diminished or absent flow related enhancement in the LEFT ICA. String sign is suspected. Formal catheter angiogram recommended for further evaluation to potentially re-establish flow in a diseased vessel. Critical Value/emergent results were called by telephone at the time of interpretation on 12/11/2015 at 1:49 pm to Dr. Nemiah Commander , who verbally acknowledged these results. Electronically Signed   By: Elsie Stain M.D.   On: 12/11/2015 13:51   Mr Maxine Glenn Head/brain Wo Cm  12/11/2015  CLINICAL DATA:  Patient with atrial fibrillation not on long-term anticoagulation, also end-stage renal disease, hypertension, and diabetes, presents with disorder of speech 12/10/2015. Also Syncopal episode. EXAM: MRI HEAD WITHOUT CONTRAST MRA HEAD WITHOUT CONTRAST TECHNIQUE: Multiplanar, multiecho pulse sequences of the brain and surrounding structures were obtained without intravenous contrast. Angiographic images of  the head were obtained using MRA technique without contrast. COMPARISON:  MR head 11/17/2015.  CT head 12/10/2015. FINDINGS: MRI HEAD FINDINGS Multifocal areas of acute infarction affect the LEFT  hemisphere, including LEFT frontal, temporal, parietal, and occipital lobes, LEFT caudate nucleus and lentiform nucleus, and regional subcortical greater than periventricular white matter. No posterior fossa involvement. No RIGHT hemisphere involvement. These could be related to multiple emboli from the diseased LEFT ICA described below, or watershed type phenomenon from hypoperfusion. Normal for age cerebral volume. Paucity of white matter disease given patient's comorbidities. Remote RIGHT posterior frontal and subcortical infarction. No significant chronic hemorrhage. No midline abnormality. Extracranial soft tissues unremarkable. No osseous findings MRA HEAD FINDINGS The RIGHT internal carotid artery is widely patent. The basilar artery is widely patent with RIGHT vertebral dominant. There is a 50% stenosis of the non dominant LEFT vertebral at its junction with basilar. The RIGHT M1 MCA is widely patent. Minor non stenotic narrowing of the proximal RIGHT A1 ACA. The anterior communicating artery poorly seen. There is apparent cross fill right-to-left supplying the anterior circulation. There is some flow related enhancement of the LEFT ICA in its cavernous and supraclinoid segment, but there is diminished to absent petrous LEFT ICA flow. Tapering flow related enhancement is observed in a small but patent LEFT cervical ICA. LEFT M1 MCA small but patent. Severe stenosis proximal A2 LEFT ACA. Severely diminished flow related enhancement in the LEFT M2 and M3 vessels. No PCA stenosis or occlusion. No cerebellar branch occlusion. No intracranial aneurysm. Compared with the prior MRI from 11/17/2015, the infarctions are new, and the LEFT ICA appears to be further diminished caliber. IMPRESSION: Multifocal areas of acute  infarction affecting the LEFT hemisphere without visible hemorrhage. It is unclear if these represent multiple emboli or watershed type phenomenon. No posterior fossa or RIGHT hemisphere involvement. Segmental areas of diminished or absent flow related enhancement in the LEFT ICA. String sign is suspected. Formal catheter angiogram recommended for further evaluation to potentially re-establish flow in a diseased vessel. Critical Value/emergent results were called by telephone at the time of interpretation on 12/11/2015 at 1:49 pm to Dr. Nemiah Commander , who verbally acknowledged these results. Electronically Signed   By: Elsie Stain M.D.   On: 12/11/2015 13:51    EKG:   Orders placed or performed during the hospital encounter of 12/10/15  . ED EKG  . ED EKG  . EKG 12-Lead  . EKG 12-Lead  . EKG 12-Lead  . EKG 12-Lead    ASSESSMENT AND PLAN:   54 year old female with multiple medical problems including COPD, end-stage renal disease on peritoneal dialysis, hypertension, hyperlipidemia, congestive heart failure with systolic dysfunction, chronic anemia, history of atrial fibrillation, diabetes mellitus presents to the hospital secondary to change in mental status, speech and RIGHT-sided weakness.  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 -MRA with minimal flow through left ICA. Appreciate neurology consult. -We will place on dual antiplatelet agent treatment. Add aspirin and Plavix. -Because of minimal flow through the left-sided ICA, blood pressure needs to be maintained now less than 160 systolic. Discontinue the metoprolol for now. -Per neurology recommendations, patient will be transferred to stepdown and request for a arterial line for close blood pressure monitoring. -Ultrasound Dopplers of the neck was showed less than 50% stenosis about 3 weeks ago. Could be an embolic clot thrown up too. -CT angiogram of the neck ordered, vascular surgery consult for  minimal flow through left ICA. -Most recent echo 3 weeks ago with no clot. -Patient has new onset the fascia likely secondary to stroke. Speech therapy consult. -OT and physical therapy consult requested as well  #2 elevated  troponin-possibly demand ischemia. Patient denies any chest pain. -Has stroke and also an end-stage renal dialysis patient. -Continue to monitor troponin.  #3 history of paroxysmal atrial fibrillation with 17 beats of nonsustained V. tach on telemetry. -Appreciate cardiology consult. -Metoprolol discontinued for now due to acute stroke and permissive hypertension requested  #4  end-stage renal disease on peritoneal dialysis-also has hemodialysis catheter. Prior history of peritonitis and abdominal hernias. -Nephrology consulted.  #5 anemia of chronic disease-stable. -With acute stroke, likely not a candidate for Procrit  #6 hypokalemia-per nephrology.  #7 tobacco use disorder-smoking cessation advised  # 8 DVT prophylaxis-on subcutaneous heparin   All the records are reviewed and case discussed with Care Management/Social Workerr. Management plans discussed with the patient, family and they are in agreement.  CODE STATUS: Full Code  TOTAL CRITICAL CARE TIME SPENT IN TAKING CARE OF THIS PATIENT: 45 minutes.   POSSIBLE D/C IN 2-3 DAYS, DEPENDING ON CLINICAL CONDITION.   Enid BaasKALISETTI,Chaske Paskett M.D on 12/11/2015 at 2:45 PM  Between 7am to 6pm - Pager - [phone]  After 6pm go to [email or web address] - password [email or web address]  [facility]  Office  [phone]  CC: Primary care physician; Sherlene ShamsULLO, TERESA L, MD

## 2015-12-11 NOTE — H&P (Addendum)
[facility] [place] [facility]   PATIENT NAME: Dorothy Long    MR#:  [id number]  DATE OF BIRTH:  December 26, 1960  DATE OF ADMISSION:  12/10/2015  PRIMARY CARE PHYSICIAN: Sherlene Shams, MD   REQUESTING/REFERRING PHYSICIAN:   CHIEF COMPLAINT:   Chief Complaint  Patient presents with  . Altered Mental Status    HISTORY OF PRESENT ILLNESS: Christabel Camire  is a 54 y.o. female with a known history of hypertension, hyperlipidemia, COPD, end-stage renal disease on peritoneal dialysis, congestive heart failure, anemia presented to the emergency room because of difficulty in speech and syncope. Patient usually does peritoneal dialysis around 9 PM in the night every day. Yesterday she also hooked up for peritoneal dialysis, but could not complete it. Patient's husband found her on the floor with loss of consciousness. Patient's husband is at bedside and says it was no evidence of any seizure. After the patient woke up, she was not able to speak and she was brought to the emergency room. The above event happened around 9 PM last night. Tele neurology consult was done in the emergency room, they recommended baby aspirin and said patient is out of the window for any thrombolytic therapy. Upon evaluation in the ER, patient is able to move all extremities. She was able to express words and talk but had slurred speech and dysarthria. No tingling or numbness in any part of the body. No history of any head injury. Patient was recently worked up for stroke the first week of December at our hospital.  PAST MEDICAL HISTORY:   Past Medical History  Diagnosis Date  . Hypertension   . Hyperlipidemia   . COPD (chronic obstructive pulmonary disease) (HCC)     a. not on home O2  . Anemia   . Valvular disease     a. 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 echo 2015: EF 45-50%, borderline LVH, mod dilated LA, mildly dilated RA, small pericardial effusion, mod MR (rheumatic deformity), MS mild-mod, no gradient, PASP ele  . Chronic combined systolic and diastolic CHF (congestive heart failure) (HCC)     a. echo reports above  . PAF (paroxysmal atrial fibrillation) (HCC)     a. in setting of diarrhea 09/21/2013; b. not on long term full dose anticoagulation; c. CHADSVASC at least 4 (CHF, HTN, DM, female)  . Diabetes mellitus (HCC)   . Peritoneal dialysis status (HCC) 4- 15-15  . Ventral hernia   . Inguinal hernia   . History of stress test     a. 2014: no convincing evidence of pharmacologically induced ischemia,  apparent ant apical defect present only on attenuation corrected images favored to reflect artifact due to overcorrection & may be worse on stress imaging secondary to greater patient motion. A small area of ischemia is felt less likely but is difficult to entirely exclude, EF 55%  . ESRD (end stage renal disease) on dialysis (HCC)     a. HD on T,T,S; b. 2/2 focal segmental glomerulosclerosis   . Dialysis patient [facility])     PAST SURGICAL HISTORY:  Past Surgical History  Procedure Laterality Date  . Abdominal hysterectomy    . Cholecystectomy    . Knee surgery Right   . Foot surgery Bilateral   . Av fistula placement Left 10-26-13  Dr Gilda CreaseSchnier  . Peritoneal catheter insertion  03-29-14    Dr Gilda CreaseSchnier  . Inguinal hernia repair Left 07/13/2015    Procedure: HERNIA REPAIR INGUINAL ADULT;  Surgeon: Earline MayotteJeffrey W Byrnett, MD;  Location: [facility] ORS;  Service: General;  Laterality: Left;  Marland Kitchen. Ventral hernia repair N/A 07/13/2015    Procedure: HERNIA REPAIR VENTRAL ADULT;  Surgeon: Earline MayotteJeffrey W Byrnett, MD;  Location: [facility] ORS;  Service: General;  Laterality: N/A;    SOCIAL HISTORY:  Social History  Substance Use Topics  . Smoking status: Current Some Day Smoker -- 0.50 packs/day for 25 years     Types: Cigarettes  . Smokeless tobacco: Never Used  . Alcohol Use: No     Comment: occasional    FAMILY HISTORY:  Family History  Problem Relation Age of Onset  . Cancer Mother     colon  . Heart disease Father   . Hypertension Father     DRUG ALLERGIES:  Allergies  Allergen Reactions  . Ciprocinonide [Fluocinolone] Hives  . Demerol [Meperidine] Nausea And Vomiting  . Diflucan [Fluconazole] Nausea Only  . Flagyl [Metronidazole] Hives and Itching  . Hydrocodone Itching  . Levaquin [Levofloxacin In D5w] Other (See Comments)    Numbness in legs  . Morphine And Related Nausea And Vomiting  . Tetracyclines & Related Itching and Swelling  . Valium [Diazepam] Nausea Only    hallucinations    REVIEW OF SYSTEMS:  Could not be obtained secondary to aphasia.  MEDICATIONS AT HOME:  Prior to Admission medications   Medication Sig Start Date End Date Taking? Authorizing Provider  acetaminophen (TYLENOL) 325 MG tablet Take 325 mg by mouth every 4 (four) hours as needed for mild pain.    Yes Historical Provider, MD  albuterol (VENTOLIN HFA) 108 (90 BASE) MCG/ACT inhaler Inhale 1-2 puffs into the lungs every 6 (six) hours as needed for wheezing or shortness of breath.    Yes Historical Provider, MD  atorvastatin (LIPITOR) 20 MG tablet Take 1 tablet (20 mg total) by mouth daily. 11/22/15  Yes Sherlene Shamseresa L Tullo, MD  Calcium Acetate 667 MG TABS Take 3 capsules by mouth 3 (three) times daily.   Yes Historical Provider, MD  furosemide (LASIX) 40 MG tablet Take 40-80 mg by mouth daily.    Yes Historical Provider, MD  metoprolol succinate (TOPROL-XL) 25 MG 24 hr tablet Take 25 mg by mouth daily.   Yes Historical Provider, MD  potassium chloride (K-DUR,KLOR-CON) 10 MEQ tablet Take 2 tablets (20 mEq total) by mouth daily. 11/20/15  Yes Gale Journeyatherine P Walsh, MD  RENVELA 800 MG tablet Take 1,600 mg by mouth 3 (three) times daily with meals. And one tablet with snacks 06/17/15  Yes Historical Provider, MD   tiotropium (SPIRIVA) 18 MCG inhalation capsule Place 1 capsule (18 mcg total) into inhaler and inhale daily. 08/22/15  Yes Carollee Leitzarrie M Doss, NP  ALPRAZolam Prudy Feeler(XANAX) 0.25 MG tablet Take 1 tablet (0.25 mg total) by mouth 3 (three) times daily as needed for sleep. Patient not taking: Reported on 12/10/2015 06/16/15   Houston SirenVivek J Sainani, MD  amoxicillin-clavulanate (AUGMENTIN) 875-125 MG tablet Take 1 tablet by mouth 2 (two) times daily. Patient not taking: Reported on 12/10/2015 11/22/15   Sherlene Shamseresa L Tullo, MD  aspirin EC 81 MG tablet Take 1 tablet (81 mg total) by mouth daily. Patient not taking: Reported on 12/10/2015 11/20/15   Gale Journeyatherine P Walsh, MD  ondansetron (ZOFRAN ODT) 4 MG disintegrating tablet Take 1 tablet (4 mg total) by mouth  every 8 (eight) hours as needed for nausea or vomiting. Patient not taking: Reported on 12/10/2015 10/12/15   Milagros Loll, MD      PHYSICAL EXAMINATION:   VITAL SIGNS: Blood pressure 115/95, pulse 90, temperature 97.8 F (36.6 C), temperature source Oral, resp. rate 17, height  (1.626 m), weight 54.84 kg (120 lb 14.4 oz), SpO2 99 %.  GENERAL:  54 y.o.-year-old patient lying in the bed with no acute distress.  EYES: Pupils equal, round, reactive to light and accommodation. No scleral icterus. Extraocular muscles intact.  HEENT: Head atraumatic, normocephalic. Oropharynx and nasopharynx clear.  NECK:  Supple, no jugular venous distention. No thyroid enlargement, no tenderness.  LUNGS: Normal breath sounds bilaterally, no wheezing, rales,rhonchi or crepitation. No use of accessory muscles of respiration.  CARDIOVASCULAR: S1, S2 normal. No murmurs, rubs, or gallops.  ABDOMEN: Soft, nontender, nondistended. Bowel sounds present. No organomegaly or mass.  EXTREMITIES: No pedal edema, cyanosis, or clubbing.  NEUROLOGIC: Slurred speech,dysarthria noted. Muscle strength 5/5 in all extremities. Sensation intact. Gait not checked.  PSYCHIATRIC: mood stable SKIN: No obvious  rash, lesion, or ulcer.   LABORATORY PANEL:   CBC  Recent Labs Lab 12/10/15 2343  WBC 6.3  HGB 9.2*  HCT 29.1*  PLT 150  MCV 88.2  MCH 28.0  MCHC 31.7*  RDW 20.2*   ------------------------------------------------------------------------------------------------------------------  Chemistries   Recent Labs Lab 12/10/15 2343  NA 136  K 3.2*  CL 97*  CO2 22  GLUCOSE 128*  BUN 63*  CREATININE 9.58*  CALCIUM 7.7*  AST 23  ALT 14  ALKPHOS 104  BILITOT 0.9   ------------------------------------------------------------------------------------------------------------------ estimated creatinine clearance is 5.9 mL/min (by C-G formula based on Cr of 9.58). ------------------------------------------------------------------------------------------------------------------ No results for input(s): TSH, T4TOTAL, T3FREE, THYROIDAB in the last 72 hours.  Invalid input(s): FREET3   Coagulation profile  Recent Labs Lab 12/10/15 2344  INR 1.05   ------------------------------------------------------------------------------------------------------------------- No results for input(s): DDIMER in the last 72 hours. -------------------------------------------------------------------------------------------------------------------  Cardiac Enzymes  Recent Labs Lab 12/10/15 2343  TROPONINI 0.25*   ------------------------------------------------------------------------------------------------------------------ Invalid input(s): POCBNP  ---------------------------------------------------------------------------------------------------------------  Urinalysis    Component Value Date/Time   COLORURINE YELLOW* 11/18/2015 1122   COLORURINE Yellow 05/04/2014 0910   APPEARANCEUR HAZY* 11/18/2015 1122   APPEARANCEUR Clear 05/04/2014 0910   LABSPEC 1.015 11/18/2015 1122   LABSPEC 1.010 05/04/2014 0910   PHURINE 5.0 11/18/2015 1122   PHURINE 9.0 05/04/2014 0910   GLUCOSEU  NEGATIVE 11/18/2015 1122   GLUCOSEU NEGATIVE 11/28/2014 0839   GLUCOSEU 50 mg/dL 16/09/9603 5409   HGBUR 1+* 11/18/2015 1122   HGBUR Negative 05/04/2014 0910   BILIRUBINUR NEGATIVE 11/18/2015 1122   BILIRUBINUR neg 06/19/2014 1131   BILIRUBINUR Negative 05/04/2014 0910   KETONESUR NEGATIVE 11/18/2015 1122   KETONESUR Negative 05/04/2014 0910   PROTEINUR 100* 11/18/2015 1122   PROTEINUR >=300 06/19/2014 1131   PROTEINUR 100 mg/dL 81/19/1478 2956   UROBILINOGEN 0.2 11/28/2014 0839   UROBILINOGEN 0.2 06/19/2014 1131   NITRITE NEGATIVE 11/18/2015 1122   NITRITE neg 06/19/2014 1131   NITRITE Negative 05/04/2014 0910   LEUKOCYTESUR 2+* 11/18/2015 1122   LEUKOCYTESUR Negative 05/04/2014 0910     RADIOLOGY: Ct Head Wo Contrast  12/11/2015  CLINICAL DATA:  Larey Seat in bathroom. Slurred speech and altered mental status. History of hypertension, hyperlipidemia, and diabetes, end-stage renal disease on dialysis. EXAM: CT HEAD WITHOUT CONTRAST TECHNIQUE: Contiguous axial images were obtained from the base of the skull through the vertex without intravenous contrast. COMPARISON:  MRI of  the brain November 17, 2015 FINDINGS: The ventricles and sulci are normal. No intraparenchymal hemorrhage, mass effect nor midline shift. No acute large vascular territory infarcts. Small area RIGHT frontal encephalomalacia. No abnormal extra-axial fluid collections. Basal cisterns are patent. A few tiny extra-axial punctate calcifications. No skull fracture. The included ocular globes and orbital contents are non-suspicious. The mastoid aircells and included paranasal sinuses are well-aerated. IMPRESSION: No acute intracranial process. Focal RIGHT frontal encephalomalacia may be posttraumatic or ischemic. Punctate extra-axial calcifications could be vascular. Electronically Signed   By: Awilda Metro M.D.   On: 12/11/2015 00:09    EKG: Orders placed or performed during the hospital encounter of 12/10/15  . ED EKG  .  ED EKG  . EKG 12-Lead  . EKG 12-Lead    IMPRESSION AND PLAN: 54 year old female patient with history of hypertension, COPD, end-stage renal disease on dialysis, congestive heart failure, secondary hyperparathyroidism, anemia presented to the emergency room with difficulty in speech, aphasia and syncope. Admitting diagnosis 1. Aphasia Initially 2. Dysarthria 3. End-stage renal disease on peritoneal dialysis 4. COPD 5. Hypertension 6. Secondary hyperparathyroidism 7. Anemia 8.Syncope rule out cardiac etiology Treatment plan 1. Admit patient to telemetry 2. MRI brain and MRA brain to assess for any acute stroke 3. Initial CT head in the emergency room did not show any acute stroke 4. Aspirin 81 mg orally daily 5. DVT prophylaxis subcutaneous heparin 6. Cycle troponins to rule out ischemia, first set is elevated which could be secondary to renal disease 7. Neurology and nephrology consult. 8. Speech therapy and physical therapy consult 9. Bedside swallow study.  All the records are reviewed and case discussed with ED provider. Management plans discussed with the patient, family and they are in agreement.  CODE STATUS:FULL    TOTAL TIME TAKING CARE OF THIS PATIENT: 42 minutes.    Ihor Austin M.D on 12/11/2015 at 2:23 AM  Between 7am to 6pm - Pager - [phone]  After 6pm go to [email or web address] - password [email or web address] [facility]  [facility]  Office  [phone]  CC: Primary care physician; Sherlene Shams, MD

## 2015-12-11 NOTE — ED Provider Notes (Signed)
[facility] Emergency Department Provider Note  ____________________________________________  Time seen: Approximately 2324 AM  I have reviewed the triage vital signs and the nursing notes.   HISTORY  Chief Complaint Altered Mental Status  History is obtained mainly from EMS and from the family. The patient is unable to speak to tell me a history.  HPI Dorothy Long is a 54 y.o. female who comes into the hospital today with altered mental status.Per EMS the patient has a history of renal failure and went to the bathroom and had a syncopal episode today. They report that since then she will not talk or follow commands. EMS reports that the patient denies hitting her head and was still having purposeful movement in an effort to start her peritoneal dialysis prior to coming into the hospital. The family reports that the fall occurred around 9:30. The patient was last normal around 9 PM. The family member reports that the patient got up to get herself set up for peritoneal dialysis. She went to the bathroom and he reports that he heard a noise. When he went in he saw the patient on the floor reports that she was not talking at that point. He reports that he put her on the bed and she went to set up for peritoneal dialysis. The family reports that she continued to not see anything and actually drained so they called EMS to bring her into the hospital. Prior to the event the patient had been doing well with no complaints during the day.   Past Medical History  Diagnosis Date  . Hypertension   . Hyperlipidemia   . COPD (chronic obstructive pulmonary disease) (HCC)     a. not on home O2  . Anemia   . Valvular disease     a. 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 echo 2015: EF 45-50%,  borderline LVH, mod dilated LA, mildly dilated RA, small pericardial effusion, mod MR (rheumatic deformity), MS mild-mod, no gradient, PASP ele  . Chronic combined systolic and diastolic CHF (congestive heart failure) (HCC)     a. echo reports above  . PAF (paroxysmal atrial fibrillation) (HCC)     a. in setting of diarrhea 09/21/2013; b. not on long term full dose anticoagulation; c. CHADSVASC at least 4 (CHF, HTN, DM, female)  . Diabetes mellitus (HCC)   . Peritoneal dialysis status (HCC) 4- 15-15  . Ventral hernia   . Inguinal hernia   . History of stress test     a. 2014: no convincing evidence of pharmacologically induced ischemia,  apparent ant apical defect present only on attenuation corrected images favored to reflect artifact due to overcorrection & may be worse on stress imaging secondary to greater patient motion. A small area of ischemia is felt less likely but is difficult to entirely exclude, EF 55%  . ESRD (end stage renal disease) on dialysis (HCC)     a. HD on T,T,S; b. 2/2 focal segmental glomerulosclerosis   . Dialysis patient [facility](HCC)     Patient Active Problem List   Diagnosis Date Noted  . Dysarthria 12/11/2015  . ESRD (end stage renal disease) on dialysis (HCC) 12/11/2015  . TIA (transient ischemic attack) 12/11/2015  . Ethmoid sinusitis 11/22/2015  . Syncope 11/20/2015  . Carotid artery narrowing   . Cerebral infarction  due to unspecified mechanism   . CVA (cerebral infarction) 11/17/2015  . Nosebleed 11/13/2015  . Chronic combined systolic and diastolic CHF (congestive heart failure) (HCC)   . Hospital discharge follow-up 06/28/2015  . Diarrhea 06/22/2015  . Fatty liver 06/22/2015  . Nausea & vomiting 06/22/2015  . SBP (spontaneous bacterial peritonitis) (HCC) 06/22/2015  . Chest pain 06/15/2015  . TMJ (sprain of temporomandibular joint) 05/21/2015  . Postmenopausal atrophic vaginitis 06/19/2014  . Allergic rhinitis 05/29/2014  . Bilateral leg edema  05/15/2014  . Awaiting organ transplant 12/23/2013  . Hyperparathyroidism due to renal insufficiency (HCC) 12/23/2013  . Excess fluid volume 09/27/2013  . Paroxysmal atrial fibrillation (HCC) 09/25/2013  . GERD (gastroesophageal reflux disease) 08/12/2013  . Focal and segmental hyalinosis 08/08/2013  . Mitral stenosis with incompetence or regurgitation 07/19/2013  . Anemia in chronic kidney disease 07/19/2013  . Chronic kidney disease with peritoneal dialysis as preferred modality, stage 5 (HCC) 07/14/2013  . Tobacco abuse counseling 12/20/2012  . Vitamin D deficiency 07/22/2012  . Hyperlipidemia   . Hypertension 07/21/2012  . Family history of colon cancer 07/21/2012  . History of tobacco abuse 07/21/2012    Past Surgical History  Procedure Laterality Date  . Abdominal hysterectomy    . Cholecystectomy    . Knee surgery Right   . Foot surgery Bilateral   . Av fistula placement Left 10-26-13    Dr Gilda Crease  . Peritoneal catheter insertion  03-29-14    Dr Gilda Crease  . Inguinal hernia repair Left 07/13/2015    Procedure: HERNIA REPAIR INGUINAL ADULT;  Surgeon: Earline Mayotte, MD;  Location: [facility] ORS;  Service: General;  Laterality: Left;  Marland Kitchen Ventral hernia repair N/A 07/13/2015    Procedure: HERNIA REPAIR VENTRAL ADULT;  Surgeon: Earline Mayotte, MD;  Location: [facility] ORS;  Service: General;  Laterality: N/A;    No current outpatient prescriptions on file.  Allergies Ciprocinonide; Demerol; Diflucan; Flagyl; Hydrocodone; Levaquin; Morphine and related; Tetracyclines & related; and Valium  Family History  Problem Relation Age of Onset  . Cancer Mother     colon  . Heart disease Father   . Hypertension Father     Social History Social History  Substance Use Topics  . Smoking status: Current Some Day Smoker -- 0.50 packs/day for 25 years    Types: Cigarettes  . Smokeless tobacco: Never Used  . Alcohol Use: No     Comment: occasional    Review of Systems  Unable to  assess due to patient's inability to answer questions.  ____________________________________________   PHYSICAL EXAM:  VITAL SIGNS: ED Triage Vitals  Enc Vitals Group     BP 12/10/15 2327 134/92 mmHg     Pulse Rate 12/10/15 2327 103     Resp --      Temp 12/10/15 2327 97.8 F (36.6 C)     Temp Source 12/10/15 2327 Oral     SpO2 12/10/15 2327 96 %     Weight 12/10/15 2327 120 lb 14.4 oz (54.84 kg)     Height 12/10/15 2327  (1.626 m)     Head Cir --      Peak Flow --      Pain Score --      Pain Loc --      Pain Edu? --      Excl. in GC? --     Constitutional: Alert but not verbally responsive. No acute distress at this time. Eyes: Conjunctivae are normal. PERRL. EOMI. Head:  Atraumatic. Nose: No congestion/rhinnorhea. Mouth/Throat: Mucous membranes are moist.  Oropharynx non-erythematous. Cardiovascular: Normal rate, regular rhythm. Grossly normal heart sounds.  Good peripheral circulation. Respiratory: Normal respiratory effort.  No retractions. Lungs CTAB. Gastrointestinal: Soft and nontender. distended. Positive bowel sounds Musculoskeletal: No lower extremity tenderness nor edema.   Neurologic:  Patient not responding verbally. The patient appears to attempt but it comes out as a mumble. The patient does move bilateral upper extremities and attempts to pull but her grip is equal bilaterally. She does have some right-sided pronator drift. The patient does have some facial droop with smile on the left.  Skin:  Skin is warm, dry and intact.  Psychiatric: Mood and affect are normal. .  ____________________________________________   LABS (all labs ordered are listed, but only abnormal results are displayed)  Labs Reviewed  CBC - Abnormal; Notable for the following:    RBC 3.30 (*)    Hemoglobin 9.2 (*)    HCT 29.1 (*)    MCHC 31.7 (*)    RDW 20.2 (*)    All other components within normal limits  COMPREHENSIVE METABOLIC PANEL - Abnormal; Notable for the  following:    Potassium 3.2 (*)    Chloride 97 (*)    Glucose, Bld 128 (*)    BUN 63 (*)    Creatinine, Ser 9.58 (*)    Calcium 7.7 (*)    Total Protein 6.2 (*)    Albumin 3.0 (*)    GFR calc non Af Amer 4 (*)    GFR calc Af Amer 5 (*)    Anion gap 17 (*)    All other components within normal limits  TROPONIN I - Abnormal; Notable for the following:    Troponin I 0.25 (*)    All other components within normal limits  CBC WITH DIFFERENTIAL/PLATELET - Abnormal; Notable for the following:    RBC 2.99 (*)    Hemoglobin 8.4 (*)    HCT 25.8 (*)    RDW 20.1 (*)    Platelets 147 (*)    Lymphs Abs 0.6 (*)    All other components within normal limits  BASIC METABOLIC PANEL - Abnormal; Notable for the following:    Chloride 100 (*)    Glucose, Bld 107 (*)    BUN 65 (*)    Creatinine, Ser 9.71 (*)    Calcium 7.8 (*)    GFR calc non Af Amer 4 (*)    GFR calc Af Amer 5 (*)    All other components within normal limits  TROPONIN I - Abnormal; Notable for the following:    Troponin I 0.64 (*)    All other components within normal limits  LACTIC ACID, PLASMA  PROTIME-INR  LIPID PANEL  URINALYSIS COMPLETEWITH MICROSCOPIC ([facility] ONLY)  TROPONIN I  TROPONIN I  CBC   ____________________________________________  EKG  ED ECG REPORT I, Rebecka Apley, the attending physician, personally viewed and interpreted this ECG.   Date: 12/10/2015  EKG Time: 2334  Rate: 92  Rhythm: normal sinus rhythm  Axis: Normal  Intervals:Prolonged QTC  ST&T Change: None  ____________________________________________  RADIOLOGY  CT head: No acute intracranial process, focal right frontal encephalomalacia may be posttraumatic or ischemic, punctate extra-axial calcifications could be vascular. ____________________________________________   PROCEDURES  Procedure(s) performed: None  Critical Care performed: No  ____________________________________________   INITIAL IMPRESSION /  ASSESSMENT AND PLAN / ED COURSE  Pertinent labs & imaging results that were available during my care of the  patient were reviewed by me and considered in my medical decision making (see chart for details).  This is a 54 year old female with a history of end-stage renal disease on peritoneal dialysis who comes into the hospital today with a syncopal event and not speaking. The patient is unable to say if she is having pain or any other complaints at this time. Given the patient's speech and her facial droop and pronator drift concern for possible stroke. I will call a code stroke and have the patient evaluated by neurology. The patient is awake and alert but I will continue to assess her.  I had the patient evaluated by tele neurology. He feels that the patient is outside of the window of TPA for stroke and that she has functional a seizure where she is able to perform tasks but simply not able to speak. He feels that the patient needs an MRI/MRA for further evaluation at this time. The patient also has some elevation of her troponin which is concerning. Although the patient has not completed her peritoneal dialysis today she has never had a troponin as high. The patient will be admitted to the hospital for further evaluation of her symptoms. ____________________________________________   FINAL CLINICAL IMPRESSION(S) / ED DIAGNOSES  Final diagnoses:  Aphasia  Confusion  Elevated troponin      Rebecka Apley, MD 12/11/15 [phone]

## 2015-12-11 NOTE — Progress Notes (Signed)
[facility]  ROUNDING NOTE   Subjective:  Patient seen at bedside. Presented with fall from home. Patient was apparently found on the floor at home by her husband. She apparently had loss of conciousness, unclear how long.  Now having slurred speech and confusion. Didn't recognize me today which she usually does. Father currently at bedside as well.  Troponin up to 3.18 as well. MRI shows multiple areas of acute infarction involving the left hemisphere. Swallowing observed and it appears this function was intact.    Objective:  Vital signs in last 24 hours:  Temp:  [97.8 F (36.6 C)-98.5 F (36.9 C)] 97.8 F (36.6 C) (12/27 1201) Pulse Rate:  [81-109] 81 (12/27 1201) Resp:  [12-24] 18 (12/27 0800) BP: (115-145)/(75-115) 118/87 mmHg (12/27 1201) SpO2:  [91 %-100 %] 97 % (12/27 1201) Weight:  [51.166 kg (112 lb 12.8 oz)-54.84 kg (120 lb 14.4 oz)] 51.166 kg (112 lb 12.8 oz) (12/27 0327)  Weight change:  Filed Weights   12/10/15 2327 12/11/15 0327  Weight: 54.84 kg (120 lb 14.4 oz) 51.166 kg (112 lb 12.8 oz)    Intake/Output:     Intake/Output this shift:     Physical Exam: General: NAD, resting in bed, confused.  Head: Normocephalic, atraumatic. Moist oral mucosal membranes  Eyes: Anicteric  Neck: Supple, trachea midline  Lungs:  Clear to auscultation normal effort  Heart: S1S2 no rubs  Abdomen:  Soft, nontender, BS present   Extremities: no peripheral edema.  Neurologic: Awake, confused, will intermittently follow commands with all 4 extremeties  Skin: No lesions  Access: PD catheter in place    Basic Metabolic Panel:  Recent Labs Lab 12/10/15 2343 12/11/15 0452  NA 136 136  K 3.2* 3.5  CL 97* 100*  CO2 22 22  GLUCOSE 128* 107*  BUN 63* 65*  CREATININE 9.58* 9.71*  CALCIUM 7.7* 7.8*    Liver Function Tests:  Recent Labs Lab 12/10/15 2343  AST 23  ALT 14  ALKPHOS 104  BILITOT 0.9  PROT 6.2*  ALBUMIN 3.0*   No results for  input(s): LIPASE, AMYLASE in the last 168 hours. No results for input(s): AMMONIA in the last 168 hours.  CBC:  Recent Labs Lab 12/10/15 2343 12/11/15 0452  WBC 6.3 6.0  NEUTROABS  --  4.9  HGB 9.2* 8.4*  HCT 29.1* 25.8*  MCV 88.2 86.2  PLT 150 147*    Cardiac Enzymes:  Recent Labs Lab 12/10/15 2343 12/11/15 0045 12/11/15 0854  TROPONINI 0.25* 0.64* 3.18*    BNP: Invalid input(s): POCBNP  CBG: No results for input(s): GLUCAP in the last 168 hours.  Microbiology: Results for orders placed or performed during the hospital encounter of 10/11/15  Surgical pcr screen     Status: None   Collection Time: 10/11/15  7:33 PM  Result Value Ref Range Status   MRSA, PCR NEGATIVE NEGATIVE Final   Staphylococcus aureus NEGATIVE NEGATIVE Final    Comment:        The Xpert SA Assay (FDA approved for NASAL specimens in patients over 57 years of age), is one component of a comprehensive surveillance program.  Test performance has been validated by [facility] for patients greater than or equal to 17 year old. It is not intended to diagnose infection nor to guide or monitor treatment.     Coagulation Studies:  Recent Labs  12/10/15 2344  LABPROT 13.9  INR 1.05    Urinalysis: No results for input(s): COLORURINE, LABSPEC, PHURINE,  GLUCOSEU, HGBUR, BILIRUBINUR, KETONESUR, PROTEINUR, UROBILINOGEN, NITRITE, LEUKOCYTESUR in the last 72 hours.  Invalid input(s): APPERANCEUR    Imaging: Ct Head Wo Contrast  12/11/2015  CLINICAL DATA:  Larey SeatFell in bathroom. Slurred speech and altered mental status. History of hypertension, hyperlipidemia, and diabetes, end-stage renal disease on dialysis. EXAM: CT HEAD WITHOUT CONTRAST TECHNIQUE: Contiguous axial images were obtained from the base of the skull through the vertex without intravenous contrast. COMPARISON:  MRI of the brain November 17, 2015 FINDINGS: The ventricles and sulci are normal. No intraparenchymal hemorrhage, mass effect  nor midline shift. No acute large vascular territory infarcts. Small area RIGHT frontal encephalomalacia. No abnormal extra-axial fluid collections. Basal cisterns are patent. A few tiny extra-axial punctate calcifications. No skull fracture. The included ocular globes and orbital contents are non-suspicious. The mastoid aircells and included paranasal sinuses are well-aerated. IMPRESSION: No acute intracranial process. Focal RIGHT frontal encephalomalacia may be posttraumatic or ischemic. Punctate extra-axial calcifications could be vascular. Electronically Signed   By: Awilda Metroourtnay  Bloomer M.D.   On: 12/11/2015 00:09   Mr Brain Wo Contrast  12/11/2015  CLINICAL DATA:  Patient with atrial fibrillation not on long-term anticoagulation, also end-stage renal disease, hypertension, and diabetes, presents with disorder of speech 12/10/2015. Also Syncopal episode. EXAM: MRI HEAD WITHOUT CONTRAST MRA HEAD WITHOUT CONTRAST TECHNIQUE: Multiplanar, multiecho pulse sequences of the brain and surrounding structures were obtained without intravenous contrast. Angiographic images of the head were obtained using MRA technique without contrast. COMPARISON:  MR head 11/17/2015.  CT head 12/10/2015. FINDINGS: MRI HEAD FINDINGS Multifocal areas of acute infarction affect the LEFT hemisphere, including LEFT frontal, temporal, parietal, and occipital lobes, LEFT caudate nucleus and lentiform nucleus, and regional subcortical greater than periventricular white matter. No posterior fossa involvement. No RIGHT hemisphere involvement. These could be related to multiple emboli from the diseased LEFT ICA described below, or watershed type phenomenon from hypoperfusion. Normal for age cerebral volume. Paucity of white matter disease given patient's comorbidities. Remote RIGHT posterior frontal and subcortical infarction. No significant chronic hemorrhage. No midline abnormality. Extracranial soft tissues unremarkable. No osseous findings MRA  HEAD FINDINGS The RIGHT internal carotid artery is widely patent. The basilar artery is widely patent with RIGHT vertebral dominant. There is a 50% stenosis of the non dominant LEFT vertebral at its junction with basilar. The RIGHT M1 MCA is widely patent. Minor non stenotic narrowing of the proximal RIGHT A1 ACA. The anterior communicating artery poorly seen. There is apparent cross fill right-to-left supplying the anterior circulation. There is some flow related enhancement of the LEFT ICA in its cavernous and supraclinoid segment, but there is diminished to absent petrous LEFT ICA flow. Tapering flow related enhancement is observed in a small but patent LEFT cervical ICA. LEFT M1 MCA small but patent. Severe stenosis proximal A2 LEFT ACA. Severely diminished flow related enhancement in the LEFT M2 and M3 vessels. No PCA stenosis or occlusion. No cerebellar branch occlusion. No intracranial aneurysm. Compared with the prior MRI from 11/17/2015, the infarctions are new, and the LEFT ICA appears to be further diminished caliber. IMPRESSION: Multifocal areas of acute infarction affecting the LEFT hemisphere without visible hemorrhage. It is unclear if these represent multiple emboli or watershed type phenomenon. No posterior fossa or RIGHT hemisphere involvement. Segmental areas of diminished or absent flow related enhancement in the LEFT ICA. String sign is suspected. Formal catheter angiogram recommended for further evaluation to potentially re-establish flow in a diseased vessel. Critical Value/emergent results were called by telephone at the  time of interpretation on 12/11/2015 at 1:49 pm to Dr. Nemiah Commander , who verbally acknowledged these results. Electronically Signed   By: Elsie Stain M.D.   On: 12/11/2015 13:51   Mr Maxine Glenn Head/brain Wo Cm  12/11/2015  CLINICAL DATA:  Patient with atrial fibrillation not on long-term anticoagulation, also end-stage renal disease, hypertension, and diabetes, presents with  disorder of speech 12/10/2015. Also Syncopal episode. EXAM: MRI HEAD WITHOUT CONTRAST MRA HEAD WITHOUT CONTRAST TECHNIQUE: Multiplanar, multiecho pulse sequences of the brain and surrounding structures were obtained without intravenous contrast. Angiographic images of the head were obtained using MRA technique without contrast. COMPARISON:  MR head 11/17/2015.  CT head 12/10/2015. FINDINGS: MRI HEAD FINDINGS Multifocal areas of acute infarction affect the LEFT hemisphere, including LEFT frontal, temporal, parietal, and occipital lobes, LEFT caudate nucleus and lentiform nucleus, and regional subcortical greater than periventricular white matter. No posterior fossa involvement. No RIGHT hemisphere involvement. These could be related to multiple emboli from the diseased LEFT ICA described below, or watershed type phenomenon from hypoperfusion. Normal for age cerebral volume. Paucity of white matter disease given patient's comorbidities. Remote RIGHT posterior frontal and subcortical infarction. No significant chronic hemorrhage. No midline abnormality. Extracranial soft tissues unremarkable. No osseous findings MRA HEAD FINDINGS The RIGHT internal carotid artery is widely patent. The basilar artery is widely patent with RIGHT vertebral dominant. There is a 50% stenosis of the non dominant LEFT vertebral at its junction with basilar. The RIGHT M1 MCA is widely patent. Minor non stenotic narrowing of the proximal RIGHT A1 ACA. The anterior communicating artery poorly seen. There is apparent cross fill right-to-left supplying the anterior circulation. There is some flow related enhancement of the LEFT ICA in its cavernous and supraclinoid segment, but there is diminished to absent petrous LEFT ICA flow. Tapering flow related enhancement is observed in a small but patent LEFT cervical ICA. LEFT M1 MCA small but patent. Severe stenosis proximal A2 LEFT ACA. Severely diminished flow related enhancement in the LEFT M2 and M3  vessels. No PCA stenosis or occlusion. No cerebellar branch occlusion. No intracranial aneurysm. Compared with the prior MRI from 11/17/2015, the infarctions are new, and the LEFT ICA appears to be further diminished caliber. IMPRESSION: Multifocal areas of acute infarction affecting the LEFT hemisphere without visible hemorrhage. It is unclear if these represent multiple emboli or watershed type phenomenon. No posterior fossa or RIGHT hemisphere involvement. Segmental areas of diminished or absent flow related enhancement in the LEFT ICA. String sign is suspected. Formal catheter angiogram recommended for further evaluation to potentially re-establish flow in a diseased vessel. Critical Value/emergent results were called by telephone at the time of interpretation on 12/11/2015 at 1:49 pm to Dr. Nemiah Commander , who verbally acknowledged these results. Electronically Signed   By: Elsie Stain M.D.   On: 12/11/2015 13:51     Medications:     . aspirin EC  81 mg Oral Daily  . atorvastatin  20 mg Oral Daily  . calcium acetate  667 mg Oral TID  . [START ON 12/12/2015] clopidogrel  75 mg Oral Daily  . heparin  5,000 Units Subcutaneous 3 times per day  . sevelamer carbonate  1,600 mg Oral TID WC  . tiotropium  18 mcg Inhalation Daily   acetaminophen **OR** acetaminophen, albuterol, ALPRAZolam, ondansetron (ZOFRAN) IV, senna-docusate  Assessment/ Plan:  54 y.o. female with PMHx of of hypertension, hyperlipidemia, vitamin D deficiency, tobacco abuse, edema, cholecystectomy,hysterectomy, ESRD secondary to FSGS, SHPTH, severe mitral stenosis, Liver cirrhosis, inguinal  hernia repair.  ADmitted 12/10/15 with fall from home and found to have multiple acute areas of infarction in the left cerebral hemisphere.  1.  ESRD on PD:  We will start the patient back on peritoneal dialysis tonight.  We will use 1.5% dextrose solution as there does not appear to be significant lower extremity edema.  Hopefully she will be  able to recover enough that she can continue peritoneal dialysis at home.  If not we may need to transition her to hemodialysis.  2.  Anemia chronic kidney disease.  Hemoglobin currently 9.2.  Hold any further Epogen.  3.  Secondary hyperparathyroidism.  It appears that her swallowing function is intact.  Therefore continue PhosLo and Renvela for now.  4.  Acute CVA.  Multiple areas of acute infarction involving the left hemisphere.  We would recommend evaluation by vascular surgery given findings on MRI.   LOS:  Dorothy Long 12/27/20163:17 PM

## 2015-12-11 NOTE — Progress Notes (Signed)
OT Cancellation Note  Patient Details Name: Dorothy Long MRN: [id number] DOB: 22-Sep-1961   Cancelled Treatment:    Reason Eval/Treat Not Completed: Medical issues which prohibited therapy . Patient having uptrending troponin.  Dorothy CornfieldHuff, Dorothy Mago M Dorothy CornfieldJohn M Deshunda Thackston, MS/OTR/L  12/11/2015, 4:37 PM

## 2015-12-11 NOTE — ED Notes (Signed)
Family states patient had TIA December 1st of this year.

## 2015-12-11 NOTE — Progress Notes (Signed)
Received pt from the floor for close monitoring post CVA noted.Pt being placed on Peritoneal D by dialysis RN at this time, family have been at bedside and updated. Family and pt educated on importance of staying with current stroke orders and following instructions.

## 2015-12-11 NOTE — ED Notes (Signed)
Dr. Noel Geroldohen on [facility] assessing patient at this time.

## 2015-12-11 NOTE — Consult Note (Signed)
Cardiology Consultation Note  Patient ID: Dorothy GarretDonnella R Long, MRN: [id number], DOB/AGE: [birth year] 54 y.o. Admit date: 12/10/2015   Date of Consult: 12/11/2015 Primary Physician: Sherlene ShamsULLO, TERESA L, MD Primary Cardiologist: Dr. 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 [facility] with AMS.    Her first and only occurence of Afib occured while in the hospital on 09/21/2013 in the setting of acute diarrhea illness. CHADVASc does calulate out to be at least 4 (CHF, HTN, DM, female). Given this was a one-time event she has not been placed on long term, full dose anticoagulation. At that time in hospital echo showed EF 45-50%, diastolic dysfunction, biatrial enalargement, mild aortic regurgitation. Mitral leaflets were thickened and appeared to be rheumatically deformed. There was heavy mitral annular calcification. Mild mitral stenosis with a calcificed valve area of 1.6 cm^2 by pressure half-time equation and moderate, eccentric mitral regurgitation. Mild to moderate pulmonary HTN at 35 mm Hg. She underwent pharmacological stress test during that admission that showed no convincing evidence of pharmacologically induced  myocardial ischemia. Apparent anterior apical defect that was present only on attenuation corrected images was favored to reflect artifact due to overcorrection and may have been worse on stress imaging secondary to greater patient motion. A small area of ischemia was felt less likely but was difficult to entirely exclude. EF 55%.   Echo performed 05/04/2014 to evaluate her mitral valve showed EF 45-50%, borderline concentric LVH, moderately dilated left atrium, mildly dilated right atrium, small pericardial effusion, moderate mitral regurgitation. There was rheumatic deformity. Moderate thickeing and calficication of the anterior and posterior mitral valves. No gradient was recorded. The mitral stenosis appeared mild to moderate visually. Mild aortic sclerosis without stenosis. Mildly elevated PASP. Mild to moderate tricuspid regurgitation.   She has previously undergone renal biopsy that demonstrated focal [facility] in the setting of her SCr going from 1.8 to 5 to 6. Ultrasound showed atrophic right kidney. Now ESRD on HD with concomitant PD. She has [facility] and [facility]   [facility] was [facility] from 6/30 to 7/2 for abdominal/chest pain. CT abdomen pelvis on 7/1 at that time showed small left inguinal hernia. Scan on 6/30 showed enlarged liver and possible cirrhosis, advised to follow up with GI. Troponin was mildly elevated and flat trending at 0.10-->0.11 x 2 in the setting of her ESRD. Lipase was elevated at 79 on 6/30. WBC unremarkable. Case was dicussed with vascular who felt her PD catheter was ok and she was discharged home. It was felt her elevated troponin was in the setting of her ESRD. In hospital follow up patient was having considerable N/V/D with associated early satiety.   She was admitted  to [facility] for a second time from 7/8 to 7/10 for abdominal pain secondary left inguinal hernia and  nausea. Initially suspected as spontaneous bacterial peritonitis and treated with IV Rocephin. Fluid cultures were negative. She responded well to Zofran.   She presented to the ED on 7/18, after last HD session being performed on 7/16, with complaints of lower extremity edema, SOB with exertion, and orthopnea. CXR consistent with fluid overload/mild CHF. Venous HTN and small effusions. Weight 119 pound (53.9 kg) goal dry weight 54.5 kg. Case was discussed with nephrology and she was secured an outpatient dialysis session for later in the day.   She followed up with Dr. Lemar Livings, MD on 7/21 for evaluation of her left inguinal hernia and ventral hernia. At that time she had continued SOB and abdominal pain. She underwent successful repair of the left indirect inguinal and ventral hernias on 07/13/2015. She was admitted to [facility] in late October for colitis.   She was admitted to [facility] on 12/3 with presyncope after cleaning her kitchen prior to leaving for church and suddenly felt very weak along the left side with associated slurred speech. She was able to get herself to the floor and crawl to her recliner and call her husband for assistance. She is uncertain if she suffered LOC or not. Her left-sided weakness and slurred speech lasted past her arrival to [facility], then began to resolve on their own. There was never any associated chest pain, tachy-palpitations, nausea, vomiting, or diaphoresis. There was some associated SOB. Her weight has been decreasing by about 3 pounds each PD session. She has not been experiencing LEE, PND, or orthopnea. She has never experienced symptoms similar. Upon the patient's arrival to [facility] on 12/3 she was found to have a troponin of 0.14, K+ 3.6-->3.8, hgb 9.7-->9.3, UDS negative. ECG showed NSR, 87 bpm, incomplete RBBB, PVCs, poor R wave progression. CXR showed without acute cardiopulmonary disease. CT head with no acute findings. MRI without acute stroke. Carotid doppler without  significant carotid stenosis bilaterally. Echo showed EF 40-45%, diffuse HK, GR2DD, high ventricular filling pressures, calcified aortic annulus, mild AI, severely restricted mitral valve c/w moderate stenosis though by visual appearance the stenosis appeared to be severe, severely dilated LA, severely dilated RA, mild TR and PR, inferior vena cava was dilated. Her syncopal episode was of unclear etiology. It was recommended she undergo TEE in the outpatient setting (as this is generally not associated with syncope) and a 30 day event monitor.   She presented again to [facility] ED on 12/27 after developing syncope and slurred speech.   Upon the patient's arrival to [facility] they were found to have 0.25 in the setting of missed PD session with a SCr of 9.58 on PD. K+ 3.2, Ca 7.7, hgb 9.2, hct 29.1, lactic acid 1.3. ECG as below, CXR was not completed. CT head showed no acute intracranial process with a focal right frontal encephalomalacia possibly posttraumatic or ischemic. Tele neuro consult was done in the ED who recommended aspirin as the patient was outside the window for any thrombolytics and the patient was improving. She was scheduled for MRI and MRA of the brain and consults were placed. Currently, unable to assess her symptoms based on her acuity as she only responds "yes" or by counting to questions.     Past Medical History  Diagnosis Date  . Hypertension   . Hyperlipidemia   . COPD (chronic obstructive pulmonary disease) (HCC)     a. not on home O2  .  Anemia   . Valvular disease     a. 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 echo 2015: EF 45-50%, borderline LVH, mod dilated LA, mildly dilated RA, small pericardial effusion, mod MR (rheumatic deformity), MS mild-mod, no gradient, PASP ele  . Chronic combined systolic and  diastolic CHF (congestive heart failure) (HCC)     a. echo reports above  . PAF (paroxysmal atrial fibrillation) (HCC)     a. in setting of diarrhea 09/21/2013; b. not on long term full dose anticoagulation; c. CHADSVASC at least 4 (CHF, HTN, DM, female)  . Diabetes mellitus (HCC)   . Peritoneal dialysis status (HCC) 4- 15-15  . Ventral hernia   . Inguinal hernia   . History of stress test     a. 2014: no convincing evidence of pharmacologically induced ischemia,  apparent ant apical defect present only on attenuation corrected images favored to reflect artifact due to overcorrection & may be worse on stress imaging secondary to greater patient motion. A small area of ischemia is felt less likely but is difficult to entirely exclude, EF 55%  . ESRD (end stage renal disease) on dialysis (HCC)     a. HD on T,T,S; b. 2/2 focal segmental glomerulosclerosis   . Dialysis patient [facility])       Most Recent Cardiac Studies: Echo 11/18/2015: Study Conclusions  - Left ventricle: The cavity size was normal. There was mild hypertrophy of the septal wall. Systolic function was mildly to moderately reduced. The estimated ejection fraction was in the range of 40% to 45%. Diffuse hypokinesis. Features are consistent with a pseudonormal left ventricular filling pattern, with concomitant abnormal relaxation and increased filling pressure (grade 2 diastolic dysfunction). Doppler parameters are consistent with high ventricular filling pressure. - Aortic valve: Moderately calcified annulus. Trileaflet; normal thickness leaflets. There was mild regurgitation. Valve area (Vmax): 1.62 cm^2. Regurgitation pressure half-time: 735 ms. - Mitral valve: Mobility of the anterior and posterior leaflet was severely restricted. The findings are consistent with moderate stenosis by calculations. Visually, stenosis appears severe. There was moderate regurgitation. Pressure half-time: 142 ms. Mean  gradient (D): 13 mm Hg. Valve area by pressure half-time: 1.55 cm^2. - Left atrium: The atrium was severely dilated. - Right ventricle: The cavity size was normal. Wall thickness was normal. Systolic function was normal. - Right atrium: The atrium was severely dilated. - Tricuspid valve: There was mild regurgitation. - Pulmonic valve: There was mild regurgitation. - Inferior vena cava: The vessel was dilated. The respirophasic diameter changes were blunted (< 50%), consistent with elevated central venous pressure.  Impressions:  - Severely thickened, severely calcified leaflets, especially prominent in the anterior leaflet. Likely consistent with Rheumatic heart disease. Mitral valve flow only through what is likely the A1/P1 segment of the valve. Stenosis appears severe visually but calculations suggest moderate stenosis. Suggest TEE for further evaluation.   Surgical History:  Past Surgical History  Procedure Laterality Date  . Abdominal hysterectomy    . Cholecystectomy    . Knee surgery Right   . Foot surgery Bilateral   . Av fistula placement Left 10-26-13    Dr Gilda Crease  . Peritoneal catheter insertion  03-29-14    Dr Gilda Crease  . Inguinal hernia repair Left 07/13/2015    Procedure: HERNIA REPAIR INGUINAL ADULT;  Surgeon: Earline Mayotte, MD;  Location: [facility] ORS;  Service: General;  Laterality: Left;  Marland Kitchen Ventral hernia repair N/A 07/13/2015    Procedure: HERNIA REPAIR VENTRAL ADULT;  Surgeon: Earline Mayotte, MD;  Location: [facility] ORS;  Service: General;  Laterality: N/A;     Home Meds: Prior to Admission medications   Medication Sig Start Date End Date Taking? Authorizing Provider  acetaminophen (TYLENOL) 325 MG tablet Take 325 mg by mouth every 4 (four) hours as needed for mild pain.    Yes Historical Provider, MD  albuterol (VENTOLIN HFA) 108 (90 BASE) MCG/ACT inhaler Inhale 1-2 puffs into the lungs every 6 (six) hours as needed for wheezing or  shortness of breath.    Yes Historical Provider, MD  atorvastatin (LIPITOR) 20 MG tablet Take 1 tablet (20 mg total) by mouth daily. 11/22/15  Yes Sherlene Shams, MD  Calcium Acetate 667 MG TABS Take 3 capsules by mouth 3 (three) times daily.   Yes Historical Provider, MD  furosemide (LASIX) 40 MG tablet Take 40-80 mg by mouth daily.    Yes Historical Provider, MD  metoprolol succinate (TOPROL-XL) 25 MG 24 hr tablet Take 25 mg by mouth daily.   Yes Historical Provider, MD  potassium chloride (K-DUR,KLOR-CON) 10 MEQ tablet Take 2 tablets (20 mEq total) by mouth daily. 11/20/15  Yes Gale Journey, MD  RENVELA 800 MG tablet Take 1,600 mg by mouth 3 (three) times daily with meals. And one tablet with snacks 06/17/15  Yes Historical Provider, MD  tiotropium (SPIRIVA) 18 MCG inhalation capsule Place 1 capsule (18 mcg total) into inhaler and inhale daily. 08/22/15  Yes Carollee Leitz, NP  ALPRAZolam Prudy Feeler) 0.25 MG tablet Take 1 tablet (0.25 mg total) by mouth 3 (three) times daily as needed for sleep. Patient not taking: Reported on 12/10/2015 06/16/15   Houston Siren, MD  amoxicillin-clavulanate (AUGMENTIN) 875-125 MG tablet Take 1 tablet by mouth 2 (two) times daily. Patient not taking: Reported on 12/10/2015 11/22/15   Sherlene Shams, MD  aspirin EC 81 MG tablet Take 1 tablet (81 mg total) by mouth daily. Patient not taking: Reported on 12/10/2015 11/20/15   Gale Journey, MD  ondansetron (ZOFRAN ODT) 4 MG disintegrating tablet Take 1 tablet (4 mg total) by mouth every 8 (eight) hours as needed for nausea or vomiting. Patient not taking: Reported on 12/10/2015 10/12/15   Milagros Loll, MD    Inpatient Medications:  . aspirin EC  81 mg Oral Daily  . atorvastatin  20 mg Oral Daily  . calcium acetate  667 mg Oral TID  . heparin  5,000 Units Subcutaneous 3 times per day  . metoprolol tartrate  25 mg Oral Daily  . sevelamer carbonate  1,600 mg Oral TID WC  . tiotropium  18 mcg Inhalation Daily        Allergies:  Allergies  Allergen Reactions  . Ciprocinonide [Fluocinolone] Hives  . Demerol [Meperidine] Nausea And Vomiting  . Diflucan [Fluconazole] Nausea Only  . Flagyl [Metronidazole] Hives and Itching  . Hydrocodone Itching  . Levaquin [Levofloxacin In D5w] Other (See Comments)    Numbness in legs  . Morphine And Related Nausea And Vomiting  . Tetracyclines & Related Itching and Swelling  . Valium [Diazepam] Nausea Only    hallucinations    Social History   Social History  . Marital Status: Married    Spouse Name: N/A  . Number of Children: N/A  . Years of Education: N/A   Occupational History  .  disabled    Social History Main Topics  . Smoking status: Current Some Day Smoker -- 0.50 packs/day for 25 years    Types: Cigarettes  . Smokeless tobacco: Never Used  . Alcohol Use: No     Comment: occasional  . Drug Use: No  . Sexual Activity: No   Other Topics Concern  . Not on file   Social History Narrative     Family History  Problem Relation Age of Onset  . Cancer Mother     colon  . Heart disease Father   . Hypertension Father      Review of Systems: Review of Systems  Unable to perform ROS: acuity of condition     Labs:  Recent Labs  12/10/15 2343 12/11/15 0045  TROPONINI 0.25* 0.64*   Lab Results  Component Value Date   WBC 6.0 12/11/2015   HGB 8.4* 12/11/2015   HCT 25.8* 12/11/2015   MCV 86.2 12/11/2015   PLT 147* 12/11/2015    Recent Labs Lab 12/10/15 2343 12/11/15 0452  NA 136 136  K 3.2* 3.5  CL 97* 100*  CO2 22 22  BUN 63* 65*  CREATININE 9.58* 9.71*  CALCIUM 7.7* 7.8*  PROT 6.2*  --   BILITOT 0.9  --   ALKPHOS 104  --   ALT 14  --   AST 23  --   GLUCOSE 128* 107*   Lab Results  Component Value Date   CHOL 137 12/11/2015   HDL 42 12/11/2015   LDLCALC 81 12/11/2015   TRIG 71 12/11/2015   No results found for: DDIMER  Radiology/Studies:  Dg Chest 2 View  11/17/2015  CLINICAL DATA:  Chest pain EXAM: CHEST   2 VIEW COMPARISON:  08/21/2015 FINDINGS: Chronic cardiopericardial enlargement. There is no edema, consolidation, effusion, or pneumothorax. Small pneumoperitoneum, presumably from patient's peritoneal dialysis. There is no indication of abdominal pain. IMPRESSION: 1. No evidence of acute cardiopulmonary disease. 2. Pneumoperitoneum, presumably from patient's peritoneal dialysis access given no history of abdominal pain. 3. Chronic cardiomegaly. Electronically Signed   By: Marnee Spring M.D.   On: 11/17/2015 12:11   Ct Head Wo Contrast  12/11/2015  CLINICAL DATA:  Larey Seat in bathroom. Slurred speech and altered mental status. History of hypertension, hyperlipidemia, and diabetes, end-stage renal disease on dialysis. EXAM: CT HEAD WITHOUT CONTRAST TECHNIQUE: Contiguous axial images were obtained from the base of the skull through the vertex without intravenous contrast. COMPARISON:  MRI of the brain November 17, 2015 FINDINGS: The ventricles and sulci are normal. No intraparenchymal hemorrhage, mass effect nor midline shift. No acute large vascular territory infarcts. Small area RIGHT frontal encephalomalacia. No abnormal extra-axial fluid collections. Basal cisterns are patent. A few tiny extra-axial punctate calcifications. No skull fracture. The included ocular globes and orbital contents are non-suspicious. The mastoid aircells and included paranasal sinuses are well-aerated. IMPRESSION: No acute intracranial process. Focal RIGHT frontal encephalomalacia may be posttraumatic or ischemic. Punctate extra-axial calcifications could be vascular. Electronically Signed   By: Awilda Metro M.D.   On: 12/11/2015 00:09   Ct Head Wo Contrast  11/17/2015  CLINICAL DATA:  Syncopal episode cooking in the kitchen. EXAM: CT HEAD WITHOUT CONTRAST TECHNIQUE: Contiguous axial images were obtained from the base of the skull through the vertex without intravenous contrast. COMPARISON:  None available for review. FINDINGS:  Skull and Sinuses:Negative for fracture or destructive process. The visualized mastoids, middle ears, and imaged paranasal sinuses are clear. Few gas bubbles along the right vertebral  artery and in the bilateral cavernous sinus region consistent with intravenous gas from IV access. Orbits: No acute abnormality. Brain: No evidence of acute infarction, hemorrhage, hydrocephalus, or mass lesion/mass effect. Volume averaging versus small cortical and subcortical gliosis in the high right frontal lobe, lateral compatible with a remote small vessel infarct. There are bilateral sulcal calcifications which are likely sequela of infection or inflammation. Atherosclerotic calcifications is much less likely given size and location. These results were called by telephone at the time of interpretation on 11/17/2015 at 12:32 pm to Dr. Jene Every , who verbally acknowledged these results. IMPRESSION: No acute finding. Electronically Signed   By: Marnee Spring M.D.   On: 11/17/2015 12:35   Mr Brain Wo Contrast  11/17/2015  CLINICAL DATA:  Patient became lethargic after house chores. Lethargy and confusion and dizziness. Syncopal episode. End-stage renal disease. EXAM: MRI HEAD WITHOUT CONTRAST TECHNIQUE: Multiplanar, multiecho pulse sequences of the brain and surrounding structures were obtained without intravenous contrast. COMPARISON:  CT head earlier today. FINDINGS: No evidence for acute infarction, hemorrhage, mass lesion, hydrocephalus, or extra-axial fluid. Mild cerebral and cerebellar atrophy, premature for age. Slight prominence of perivascular spaces, nonspecific, can be seen in chronic hypertension. 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 Paucity of white matter disease elsewhere. Diminished caliber of the LEFT internal carotid artery compared to the RIGHT in its petrous, cavernous, and supraclinoid segments, possibly also in the high cervical region. Extracranial stenosis or dissection  could have this appearance. Symmetric and equal vertebral flow voids contribute to normal-appearing basilar artery. Normal midline structures.  No tonsillar herniation. Negative orbits, sinuses, and mastoids. RIGHT middle turbinate concha bullosa. No definite osseous findings. IMPRESSION: No acute intracranial findings.  Specifically no acute stroke. 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 Apparent diminished caliber of the LEFT internal carotid artery through much of its visualized course. A proximal extracranial stenosis or dissection is not excluded. Recommend CT angiography of the head and neck for further evaluation to evaluate the LEFT carotid system. Electronically Signed   By: Elsie Stain M.D.   On: 11/17/2015 21:02   US Carotid Bilateral  11/18/2015  CLINICAL DATA:  Cerebral infarction. EXAM: BILATERAL CAROTID DUPLEX ULTRASOUND TECHNIQUE: Wallace Cullens scale imaging, color Doppler and duplex ultrasound were performed of bilateral carotid and vertebral arteries in the neck. COMPARISON:  None. FINDINGS: Criteria: Quantification of carotid stenosis is based on velocity parameters that correlate the residual internal carotid diameter with NASCET-based stenosis levels, using the diameter of the distal internal carotid lumen as the denominator for stenosis measurement. The following velocity measurements were obtained: RIGHT ICA:  54/19 cm/sec CCA:  91/28 cm/sec SYSTOLIC ICA/CCA RATIO:  0.6 DIASTOLIC ICA/CCA RATIO:  0.7 ECA:  45 cm/sec LEFT ICA:  25/11 cm/sec CCA:  65/13 cm/sec SYSTOLIC ICA/CCA RATIO:  0.4 DIASTOLIC ICA/CCA RATIO:  0.8 ECA:  49 cm/sec RIGHT CAROTID ARTERY: There is a mild amount of calcified plaque at the level of the distal common carotid artery, carotid bulb and proximal ICA. Based on velocities estimated right ICA stenosis is less than 50%. RIGHT VERTEBRAL ARTERY: Antegrade flow with normal waveform and velocity. LEFT CAROTID ARTERY:  Moderate focal calcified plaque present at the level of the distal bulb and proximal ICA. Based on turbulent flow focally present in the plaque, there may be a component of plaque ulceration. Based on normal velocities, estimated left ICA stenosis is less than 50%. LEFT VERTEBRAL ARTERY: Antegrade flow with normal waveform  and velocity. IMPRESSION: No significant carotid stenosis identified. Bilateral plaque noted at the level of the bulbs and proximal internal carotid arteries, left greater than right. The proximal ICA plaque on the left may be partially ulcerated. Estimated bilateral ICA stenoses are less than 50%. Electronically Signed   By: Irish Lack M.D.   On: 11/18/2015 15:37    EKG: NSR with fusion complexes, 100 bpm, prolonged QT, poor R wave progression, rare PVC, no significant st/t changes  Weights: Filed Weights   12/10/15 2327 12/11/15 0327  Weight: 120 lb 14.4 oz (54.84 kg) 112 lb 12.8 oz (51.166 kg)     Physical Exam: Blood pressure 139/75, pulse 102, temperature 98.5 F (36.9 C), temperature source Oral, resp. rate 24, height 5\' 4"  (1.626 m), weight 112 lb 12.8 oz (51.166 kg), SpO2 100 %. Body mass index is 19.35 kg/(m^2). General: Well developed, well nourished, in no acute distress. Head: Normocephalic, atraumatic, sclera non-icteric, no xanthomas, nares are without discharge.  Neck: Negative for carotid bruits. JVD not elevated. Lungs: Clear bilaterally to auscultation without wheezes, rales, or rhonchi. Breathing is unlabored. Heart: RRR with S1 S2. II/VI systolic murmur LUSB. No rubs, or gallops appreciated. Abdomen: Soft, non-tender, non-distended with normoactive bowel sounds. No hepatomegaly. No rebound/guarding. No obvious abdominal masses. Msk:  Strength and tone appear normal for age. Extremities: No clubbing or cyanosis. No edema.  Distal pedal pulses are 2+ and equal bilaterally. Neuro: Alert, not oriented. She knows who she is, but does not know time or  place. No facial asymmetry. No focal deficit. Moves all extremities spontaneously. Strength is 4/5 on left upper extremity, otherwise 5/5. She will not stick her tongue out.  Psych:  Responds to questions appropriately with a normal affect.    Assessment and Plan:   1. Elevated troponin: -Mildly elevated likely in the setting of the patient's ESRD on PD including not being able to complete her session on the night of 12/26 and possible ischemic vs posttraumatic event along the right frontal cortex with development of encephalomalacia  -Echo pending to trend EF and wall motion -Expect further troponin levels to be elevated given the above, would not act on them unless they are significantly elevated   2. AMS/likely stroke: -Patient with disorder of speech -Patient only responds "yes" to questions or with counting from 1-6 followed by 8-13 with subsequent question  -MRI and MRA brain is pending to further evaluate for ischemic event -Patient has remote history of PAF in 09/2013 in the setting of acute diarrhea illness, not on long term anticoagulation given this was a one time event -Suspect possible cardioembolic event given recent symptoms and multiple admissions from #3, though this must be confirmed  -Recommend neurology consultation   3. PAF: -Maintaining sinus rhythm with 17 beats of NSVT on telemetry  -Wearing 30 day event monitor as an outpatient  -CHADSVASc at least 4 (CHF, HTN, DM, female) -Consider long term full dose anticoagulation after clearance by neurology, if indicated with Eliquis or warfarin  -Toprol XL 25 mg daily  4. 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 Chronic combined systolic and diastolic CHF: -She does not appear to be currently volume overloaded -Volume managed by dialysis -Toprol as above  6. ESRD: -Per  Renal  7. HTN: -Controlled -Continue current medications  8. Ongoing tobacco abuse: -Cessation advised  9. Anemia of chronic disease: -Maintain  hgb >8.5  10. Hypokalemia: -Per Renal   Signed, Eula Listen, PA-C Pager: [phone] 12/11/2015, 7:49 AM

## 2015-12-12 LAB — BASIC METABOLIC PANEL
ANION GAP: 15 (ref 5–15)
BUN: 62 mg/dL — AB (ref 6–20)
CALCIUM: 8.2 mg/dL — AB (ref 8.9–10.3)
CO2: 21 mmol/L — AB (ref 22–32)
Chloride: 99 mmol/L — ABNORMAL LOW (ref 101–111)
Creatinine, Ser: 9.59 mg/dL — ABNORMAL HIGH (ref 0.44–1.00)
GFR calc Af Amer: 5 mL/min — ABNORMAL LOW (ref >60)
GFR, EST NON AFRICAN AMERICAN: 4 mL/min — AB (ref >60)
GLUCOSE: 93 mg/dL (ref 65–99)
Potassium: 3.5 mmol/L (ref 3.5–5.1)
Sodium: 135 mmol/L (ref 135–145)

## 2015-12-12 NOTE — Progress Notes (Signed)
Patient: Dorothy Long / Admit Date: 12/10/2015 / Date of Encounter: 12/12/2015, 1:05 PM   Subjective: More alert today. Less dysarthric.   Review of Systems: Review of Systems  Unable to perform ROS: acuity of condition     Objective: Telemetry: NSR, 90's, 21 beats of NSVT Physical Exam: Blood pressure 126/91, pulse 80, temperature 97.7 F (36.5 C), temperature source Oral, resp. rate 20, height 5\' 4"  (1.626 m), weight 108 lb 0.4 oz (49 kg), SpO2 100 %. Body mass index is 18.53 kg/(m^2). General: Well developed, well nourished, in no acute distress. Head: Normocephalic, atraumatic, sclera non-icteric, no xanthomas, nares are without discharge. Neck: Negative for carotid bruits. JVP not elevated. Lungs: Clear bilaterally to auscultation without wheezes, rales, or rhonchi. Breathing is unlabored. Heart: RRR S1 S2 without murmurs, rubs, or gallops.  Abdomen: Soft, non-tender, non-distended with normoactive bowel sounds. No rebound/guarding. Extremities: No clubbing or cyanosis. No edema. Distal pedal pulses are 2+ and equal bilaterally. Neuro: Alert. Moves all extremities spontaneously. 4/5 strength left upper extremity.  Psych:  Responds to questions appropriately with a normal affect.    Intake/Output Summary (Last 24 hours) at 12/12/15 1305 Last data filed at 12/12/15 0815  Gross per 24 hour  Intake      0 ml  Output    846 ml  Net   -846 ml    Inpatient Medications:  . aspirin EC  81 mg Oral Daily  . atorvastatin  20 mg Oral Daily  . calcium acetate  667 mg Oral TID  . clopidogrel  75 mg Oral Daily  . dialysis solution 1.5% low-MG/low-CA   Intraperitoneal Q24H  . gentamicin cream  1 application Topical Daily  . heparin  5,000 Units Subcutaneous 3 times per day  . sevelamer carbonate  1,600 mg Oral TID WC  . tiotropium  18 mcg Inhalation Daily   Infusions:    Labs:  Recent Labs  12/11/15 0452 12/12/15 0340  NA 136 135  K 3.5 3.5  CL 100* 99*  CO2 22  21*  GLUCOSE 107* 93  BUN 65* 62*  CREATININE 9.71* 9.59*  CALCIUM 7.8* 8.2*    Recent Labs  12/10/15 2343  AST 23  ALT 14  ALKPHOS 104  BILITOT 0.9  PROT 6.2*  ALBUMIN 3.0*    Recent Labs  12/10/15 2343 12/11/15 0452  WBC 6.3 6.0  NEUTROABS  --  4.9  HGB 9.2* 8.4*  HCT 29.1* 25.8*  MCV 88.2 86.2  PLT 150 147*    Recent Labs  12/10/15 2343 12/11/15 0045 12/11/15 0854 12/11/15 1502  TROPONINI 0.25* 0.64* 3.18* 4.79*   Invalid input(s): POCBNP No results for input(s): HGBA1C in the last 72 hours.   Weights: Filed Weights   12/10/15 2327 12/11/15 0327 12/12/15 0400  Weight: 120 lb 14.4 oz (54.84 kg) 112 lb 12.8 oz (51.166 kg) 108 lb 0.4 oz (49 kg)     Radiology/Studies:  Dg Chest 2 View  11/17/2015  CLINICAL DATA:  Chest pain EXAM: CHEST  2 VIEW COMPARISON:  08/21/2015 FINDINGS: Chronic cardiopericardial enlargement. There is no edema, consolidation, effusion, or pneumothorax. Small pneumoperitoneum, presumably from patient's peritoneal dialysis. There is no indication of abdominal pain. IMPRESSION: 1. No evidence of acute cardiopulmonary disease. 2. Pneumoperitoneum, presumably from patient's peritoneal dialysis access given no history of abdominal pain. 3. Chronic cardiomegaly. Electronically Signed   By: Marnee SpringJonathon  Watts M.D.   On: 11/17/2015 12:11   Ct Head Wo Contrast  12/11/2015  CLINICAL  DATA:  Fell in bathroom. Slurred speech and altered mental status. History of hypertension, hyperlipidemia, and diabetes, end-stage renal disease on dialysis. EXAM: CT HEAD WITHOUT CONTRAST TECHNIQUE: Contiguous axial images were obtained from the base of the skull through the vertex without intravenous contrast. COMPARISON:  MRI of the brain November 17, 2015 FINDINGS: The ventricles and sulci are normal. No intraparenchymal hemorrhage, mass effect nor midline shift. No acute large vascular territory infarcts. Small area RIGHT frontal encephalomalacia. No abnormal extra-axial  fluid collections. Basal cisterns are patent. A few tiny extra-axial punctate calcifications. No skull fracture. The included ocular globes and orbital contents are non-suspicious. The mastoid aircells and included paranasal sinuses are well-aerated. IMPRESSION: No acute intracranial process. Focal RIGHT frontal encephalomalacia may be posttraumatic or ischemic. Punctate extra-axial calcifications could be vascular. Electronically Signed   By: Awilda Metro M.D.   On: 12/11/2015 00:09   Ct Head Wo Contrast  11/17/2015  CLINICAL DATA:  Syncopal episode cooking in the kitchen. EXAM: CT HEAD WITHOUT CONTRAST TECHNIQUE: Contiguous axial images were obtained from the base of the skull through the vertex without intravenous contrast. COMPARISON:  None available for review. FINDINGS: Skull and Sinuses:Negative for fracture or destructive process. The visualized mastoids, middle ears, and imaged paranasal sinuses are clear. Few gas bubbles along the right vertebral artery and in the bilateral cavernous sinus region consistent with intravenous gas from IV access. Orbits: No acute abnormality. Brain: No evidence of acute infarction, hemorrhage, hydrocephalus, or mass lesion/mass effect. Volume averaging versus small cortical and subcortical gliosis in the high right frontal lobe, lateral compatible with a remote small vessel infarct. There are bilateral sulcal calcifications which are likely sequela of infection or inflammation. Atherosclerotic calcifications is much less likely given size and location. These results were called by telephone at the time of interpretation on 11/17/2015 at 12:32 pm to Dr. Jene Every , who verbally acknowledged these results. IMPRESSION: No acute finding. Electronically Signed   By: Marnee Spring M.D.   On: 11/17/2015 12:35   Ct Angio Neck W/cm &/or Wo/cm  12/11/2015  CLINICAL DATA:  Multiple acute LEFT hemisphere infarcts. Abnormal appearance of the LEFT internal carotid artery  on MRI and MRA. Right-sided weakness with aphasia beginning yesterday. EXAM: CT ANGIOGRAPHY NECK TECHNIQUE: Multidetector CT imaging of the neck was performed using the standard protocol during bolus administration of intravenous contrast. Multiplanar CT image reconstructions and MIPs were obtained to evaluate the vascular anatomy. Carotid stenosis measurements (when applicable) are obtained utilizing NASCET criteria, using the distal internal carotid diameter as the denominator. CONTRAST:  OMNIPAQUE IOHEXOL 350 MG/ML SOLN COMPARISON:  MRI and MRA earlier today.  CT head 12/10/2015. FINDINGS: Aortic arch: Standard branching. Imaged portion shows no evidence of aneurysm or dissection. No significant stenosis of the major arch vessel origins. Right carotid system: Minor calcific disease in the common carotid artery and bifurcation. Widely patent RIGHT ICA. No evidence of dissection, stenosis (50% or greater) or occlusion. Left carotid system: Moderate calcific and soft plaque is present in the bulb. In the cervical ICA at approximately the C2 level, the vessel caliber becomes diffusely small throughout the cervical segment. No skull base pseudoaneurysm. No intraluminal flap. The horizontal and vertical petrous segments of the LEFT ICA are patent but small but patent. No evidence of dissection, stenosis (50% or greater) or occlusion. Vertebral arteries: Codominant. No evidence of dissection, stenosis (50% or greater) or occlusion. Skeleton: Negative Other neck: No masses. No lung apex lesion. Mild apical pleural thickening. IMPRESSION:  Normal RIGHT anterior circulation. Non stenotic calcific and soft plaque at the LEFT ICA origin, but no flow-limiting stenosis at that level nor is there strong evidence for LEFT ICA dissection. The cervical LEFT ICA becomes diffusely small but is patent at the C2 level extending Cephalad to the skullbase. This is favored to represent luminal narrowing due to severe intracranial  stenosis, involving the LICA cavernous or supraclinoid level, or both. Electronically Signed   By: Elsie Stain M.D.   On: 12/11/2015 17:28   Mr Brain Wo Contrast  12/11/2015  CLINICAL DATA:  Patient with atrial fibrillation not on long-term anticoagulation, also end-stage renal disease, hypertension, and diabetes, presents with disorder of speech 12/10/2015. Also Syncopal episode. EXAM: MRI HEAD WITHOUT CONTRAST MRA HEAD WITHOUT CONTRAST TECHNIQUE: Multiplanar, multiecho pulse sequences of the brain and surrounding structures were obtained without intravenous contrast. Angiographic images of the head were obtained using MRA technique without contrast. COMPARISON:  MR head 11/17/2015.  CT head 12/10/2015. FINDINGS: MRI HEAD FINDINGS Multifocal areas of acute infarction affect the LEFT hemisphere, including LEFT frontal, temporal, parietal, and occipital lobes, LEFT caudate nucleus and lentiform nucleus, and regional subcortical greater than periventricular white matter. No posterior fossa involvement. No RIGHT hemisphere involvement. These could be related to multiple emboli from the diseased LEFT ICA described below, or watershed type phenomenon from hypoperfusion. Normal for age cerebral volume. Paucity of white matter disease given patient's comorbidities. Remote RIGHT posterior frontal and subcortical infarction. No significant chronic hemorrhage. No midline abnormality. Extracranial soft tissues unremarkable. No osseous findings MRA HEAD FINDINGS The RIGHT internal carotid artery is widely patent. The basilar artery is widely patent with RIGHT vertebral dominant. There is a 50% stenosis of the non dominant LEFT vertebral at its junction with basilar. The RIGHT M1 MCA is widely patent. Minor non stenotic narrowing of the proximal RIGHT A1 ACA. The anterior communicating artery poorly seen. There is apparent cross fill right-to-left supplying the anterior circulation. There is some flow related enhancement  of the LEFT ICA in its cavernous and supraclinoid segment, but there is diminished to absent petrous LEFT ICA flow. Tapering flow related enhancement is observed in a small but patent LEFT cervical ICA. LEFT M1 MCA small but patent. Severe stenosis proximal A2 LEFT ACA. Severely diminished flow related enhancement in the LEFT M2 and M3 vessels. No PCA stenosis or occlusion. No cerebellar branch occlusion. No intracranial aneurysm. Compared with the prior MRI from 11/17/2015, the infarctions are new, and the LEFT ICA appears to be further diminished caliber. IMPRESSION: Multifocal areas of acute infarction affecting the LEFT hemisphere without visible hemorrhage. It is unclear if these represent multiple emboli or watershed type phenomenon. No posterior fossa or RIGHT hemisphere involvement. Segmental areas of diminished or absent flow related enhancement in the LEFT ICA. String sign is suspected. Formal catheter angiogram recommended for further evaluation to potentially re-establish flow in a diseased vessel. Critical Value/emergent results were called by telephone at the time of interpretation on 12/11/2015 at 1:49 pm to Dr. Nemiah Commander , who verbally acknowledged these results. Electronically Signed   By: Elsie Stain M.D.   On: 12/11/2015 13:51   Mr Brain Wo Contrast  11/17/2015  CLINICAL DATA:  Patient became lethargic after house chores. Lethargy and confusion and dizziness. Syncopal episode. End-stage renal disease. EXAM: MRI HEAD WITHOUT CONTRAST TECHNIQUE: Multiplanar, multiecho pulse sequences of the brain and surrounding structures were obtained without intravenous contrast. COMPARISON:  CT head earlier today. FINDINGS: No evidence for acute infarction, hemorrhage, mass  lesion, hydrocephalus, or extra-axial fluid. Mild cerebral and cerebellar atrophy, premature for age. Slight prominence of perivascular spaces, nonspecific, can be seen in chronic hypertension. Remote RIGHT posterior frontal cortical and  subcortical infarction. Paucity of white matter disease elsewhere. Diminished caliber of the LEFT internal carotid artery compared to the RIGHT in its petrous, cavernous, and supraclinoid segments, possibly also in the high cervical region. Extracranial stenosis or dissection could have this appearance. Symmetric and equal vertebral flow voids contribute to normal-appearing basilar artery. Normal midline structures.  No tonsillar herniation. Negative orbits, sinuses, and mastoids. RIGHT middle turbinate concha bullosa. No definite osseous findings. IMPRESSION: No acute intracranial findings.  Specifically no acute stroke. 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 Apparent diminished caliber of the LEFT internal carotid artery through much of its visualized course. A proximal extracranial stenosis or dissection is not excluded. Recommend CT angiography of the head and neck for further evaluation to evaluate the LEFT carotid system. Electronically Signed   By: Elsie Stain M.D.   On: 11/17/2015 21:02   US Carotid Bilateral  11/18/2015  CLINICAL DATA:  Cerebral infarction. EXAM: BILATERAL CAROTID DUPLEX ULTRASOUND TECHNIQUE: Wallace Cullens scale imaging, color Doppler and duplex ultrasound were performed of bilateral carotid and vertebral arteries in the neck. COMPARISON:  None. FINDINGS: Criteria: Quantification of carotid stenosis is based on velocity parameters that correlate the residual internal carotid diameter with NASCET-based stenosis levels, using the diameter of the distal internal carotid lumen as the denominator for stenosis measurement. The following velocity measurements were obtained: RIGHT ICA:  54/19 cm/sec CCA:  91/28 cm/sec SYSTOLIC ICA/CCA RATIO:  0.6 DIASTOLIC ICA/CCA RATIO:  0.7 ECA:  45 cm/sec LEFT ICA:  25/11 cm/sec CCA:  65/13 cm/sec SYSTOLIC ICA/CCA RATIO:  0.4 DIASTOLIC ICA/CCA RATIO:  0.8 ECA:  49 cm/sec RIGHT CAROTID ARTERY: There is a  mild amount of calcified plaque at the level of the distal common carotid artery, carotid bulb and proximal ICA. Based on velocities estimated right ICA stenosis is less than 50%. RIGHT VERTEBRAL ARTERY: Antegrade flow with normal waveform and velocity. LEFT CAROTID ARTERY: Moderate focal calcified plaque present at the level of the distal bulb and proximal ICA. Based on turbulent flow focally present in the plaque, there may be a component of plaque ulceration. Based on normal velocities, estimated left ICA stenosis is less than 50%. LEFT VERTEBRAL ARTERY: Antegrade flow with normal waveform and velocity. IMPRESSION: No significant carotid stenosis identified. Bilateral plaque noted at the level of the bulbs and proximal internal carotid arteries, left greater than right. The proximal ICA plaque on the left may be partially ulcerated. Estimated bilateral ICA stenoses are less than 50%. Electronically Signed   By: Irish Lack M.D.   On: 11/18/2015 15:37   Mr Maxine Glenn Head/brain Wo Cm  12/11/2015  CLINICAL DATA:  Patient with atrial fibrillation not on long-term anticoagulation, also end-stage renal disease, hypertension, and diabetes, presents with disorder of speech 12/10/2015. Also Syncopal episode. EXAM: MRI HEAD WITHOUT CONTRAST MRA HEAD WITHOUT CONTRAST TECHNIQUE: Multiplanar, multiecho pulse sequences of the brain and surrounding structures were obtained without intravenous contrast. Angiographic images of the head were obtained using MRA technique without contrast. COMPARISON:  MR head 11/17/2015.  CT head 12/10/2015. FINDINGS: MRI HEAD FINDINGS Multifocal areas of acute infarction affect the LEFT hemisphere, including LEFT frontal, temporal, parietal, and occipital lobes, LEFT caudate nucleus and lentiform nucleus, and regional subcortical greater than periventricular white matter. No posterior fossa involvement. No RIGHT  hemisphere involvement. These could be related to multiple emboli from the diseased  LEFT ICA described below, or watershed type phenomenon from hypoperfusion. Normal for age cerebral volume. Paucity of white matter disease given patient's comorbidities. Remote RIGHT posterior frontal and subcortical infarction. No significant chronic hemorrhage. No midline abnormality. Extracranial soft tissues unremarkable. No osseous findings MRA HEAD FINDINGS The RIGHT internal carotid artery is widely patent. The basilar artery is widely patent with RIGHT vertebral dominant. There is a 50% stenosis of the non dominant LEFT vertebral at its junction with basilar. The RIGHT M1 MCA is widely patent. Minor non stenotic narrowing of the proximal RIGHT A1 ACA. The anterior communicating artery poorly seen. There is apparent cross fill right-to-left supplying the anterior circulation. There is some flow related enhancement of the LEFT ICA in its cavernous and supraclinoid segment, but there is diminished to absent petrous LEFT ICA flow. Tapering flow related enhancement is observed in a small but patent LEFT cervical ICA. LEFT M1 MCA small but patent. Severe stenosis proximal A2 LEFT ACA. Severely diminished flow related enhancement in the LEFT M2 and M3 vessels. No PCA stenosis or occlusion. No cerebellar branch occlusion. No intracranial aneurysm. Compared with the prior MRI from 11/17/2015, the infarctions are new, and the LEFT ICA appears to be further diminished caliber. IMPRESSION: Multifocal areas of acute infarction affecting the LEFT hemisphere without visible hemorrhage. It is unclear if these represent multiple emboli or watershed type phenomenon. No posterior fossa or RIGHT hemisphere involvement. Segmental areas of diminished or absent flow related enhancement in the LEFT ICA. String sign is suspected. Formal catheter angiogram recommended for further evaluation to potentially re-establish flow in a diseased vessel. Critical Value/emergent results were called by telephone at the time of interpretation on  12/11/2015 at 1:49 pm to Dr. Nemiah Commander , who verbally acknowledged these results. Electronically Signed   By: Elsie Stain M.D.   On: 12/11/2015 13:51     Assessment and Plan   1. Elevated troponin: -Likely in the setting of the patient's acute stroke -However, she does have multiple risk factors for coronary artery disease and we are unable to rule this out at this time  -Echo pending to trend EF and wall motion and compare to echo done in the beginning of December -If EF is newly depressed/new WMA would need to discuss ischemic evaluation   2. Acute left ICA stroke: -Patient with disorder of speech/dysarthria  -Patient has remote history of PAF in 09/2013 in the setting of acute diarrhea illness, not on long term anticoagulation given this was a one time event -Imaging indicates severe intracranial stenosis involving the LICA cavernous/supraclinoid level -Her symptoms appear to be less likely 2/2 Afib -Vascular has been consulted and felt intervention is not indicated at this time, continue optimal medical therapy   3. PAF: -Maintaining sinus rhythm (see comments for #11)  -CHADSVASc at least 4 (CHF, HTN, DM, female) -Toprol XL 25 mg held to allow for permissive BP per neuro requests   4. 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 ESRD: -Per Renal  7. HTN: -Permissive BP per neuro  8. Ongoing tobacco abuse: -Cessation advised  9. Anemia of chronic disease: -Maintain hgb >8.5  10.  Hypokalemia: -Per Renal  11. NSVT: -Rare episodes since her admission -Beta blocker has been held to allow for more permissive BP -Should this ventricular ectopy become more  frequent may need to consider an antiarrhythmic    Signed, Eula Listen, PA-C Pager: [phone] 12/12/2015, 1:05 PM

## 2015-12-12 NOTE — Progress Notes (Signed)
PT Cancellation Note  Patient Details Name: Dorothy Long R Sigman MRN: [id number] DOB: February 05, 1961   Cancelled Treatment:    Reason Eval/Treat Not Completed: Medical issues which prohibited therapy (Patient remains in CCU; R UE arterial line in place.  Vascular consult noted, recommending maximal medical therapy at this time.  New orders for PT not received at this time.  Will continue hold; please re-consult as medically appropriate.)   Giovanne Nickolson H. Manson PasseyBrown, PT, DPT, NCS 12/12/2015, 11:24 AM [phone]

## 2015-12-12 NOTE — Progress Notes (Signed)
PD tx started as prescribed. Pt vitals stable, denies pain or any other discomforts.  Access site clean/dry no drainage. Husband as bedside, educated on how our machine works and ensured him that it was working properly, although different from his home machine.

## 2015-12-12 NOTE — Progress Notes (Signed)
OT Cancellation Note  Patient Details Name: Dorothy Long MRN: [id number] DOB: 1961-01-17   Cancelled Treatment:    Reason Eval/Treat Not Completed: Medical issues which prohibited therapy. Troponin levels were trending up. No levels taken today. Continue to hold Occupational Therapy.  Ocie CornfieldHuff, Gustin Zobrist M 12/12/2015, 11:07 AM

## 2015-12-12 NOTE — Plan of Care (Signed)
Problem: Safety: Goal: Ability to remain free from injury will improve Outcome: Progressing Pt remains free of injury during the shift.  Problem: Pain Managment: Goal: General experience of comfort will improve Outcome: Progressing Pt denies pain.  Problem: Physical Regulation: Goal: Ability to maintain clinical measurements within normal limits will improve Outcome: Progressing Pt transferred from ICU. VSS. NIH score 7. Neuro checks qx4hrs continues.  Peritoneal dialysis will be done tonight per dialysis RN.  Problem: Tissue Perfusion: Goal: Risk factors for ineffective tissue perfusion will decrease Outcome: Progressing Able to turn self. Refuses SCD's. Heparin continues.  Problem: Nutrition: Goal: Adequate nutrition will be maintained Outcome: Progressing Poor appetite. Encouraged pt to eat and drink. Fluids restrictions continues.  Problem: Education: Goal: Knowledge of disease or condition will improve Outcome: Progressing Educated pt about stroke symptoms risk factors for stroke and prevention. Pt needs reinforcement. Pt has some confusion.

## 2015-12-12 NOTE — Progress Notes (Signed)
Patient tx to floor- tele

## 2015-12-12 NOTE — Progress Notes (Signed)
Speech Language Pathology Treatment: Dysphagia  Patient Details Name: Dorothy Long MRN: [id number] DOB: Dec 12, 1961 Today's Date: 12/12/2015 Time: 0935-1000 SLP Time Calculation (min) (ACUTE ONLY): 25 min  Assessment / Plan / Recommendation Clinical Impression  Pt tolerated several boluses of puree, solid and thin liquids w/out overt s/s of aspiration. No overt cough or throat clear observed. Vocal quality remained clear throughout trials. Pt demonstrated good mastication and clearing of solid consistency. A-P transit appeared timely. No pocketing or holding was observed. Recommend upgrading diet to Dysphagia III w/thin liquids (per nursing pt should be on fluid restriction 1200 ml and renal diet). Will f/u re: toleration of diet in 1-2 days.   HPI HPI: Dorothy Long is a 54 y.o. female with a known history of hypertension, hyperlipidemia, COPD, end-stage renal disease on peritoneal dialysis, congestive heart failure, anemia presented to the emergency room because of difficulty in speech and syncope. Patient usually does peritoneal dialysis around 9 PM in the night every day. Yesterday she also hooked up for peritoneal dialysis, but could not complete it. Patient's husband found her on the floor with loss of consciousness. Patient's husband is at bedside and says it was no evidence of any seizure. After the patient woke up, she was not able to speak and she was brought to the emergency room. The above event happened around 9 PM last night. Tele neurology consult was done in the emergency room, they recommended baby aspirin and said patient is out of the window for any thrombolytic therapy. Upon evaluation in the ER, patient is able to move all extremities. She was able to express words and talk but had slurred speech and dysarthria. No tingling or numbness in any part of the body. No history of any head injury. Patient was recently worked up for stroke the first week of December at our hospital.  Pt evaluated yesterday in clinical bedside eval and placed on Dyspahgia II diet w/thin liquids. Pt now in CCU for closer observation.      SLP Plan  Continue with current plan of care     Recommendations  Diet recommendations: Dysphagia 3 (mechanical soft);Thin liquid Liquids provided via: Straw Medication Administration: Whole meds with puree Supervision: Patient able to self feed Compensations: Minimize environmental distractions;Slow rate;Small sips/bites Postural Changes and/or Swallow Maneuvers: Seated upright 90 degrees              Oral Care Recommendations: Oral care BID Follow up Recommendations: Skilled Nursing facility Plan: Continue with current plan of care   Ranier,Maaran 12/12/2015, 10:22 AM

## 2015-12-12 NOTE — Progress Notes (Addendum)
[facility] [place] at [facility]   PATIENT NAME: Dorothy Long    MR#:  [id number]  DATE OF BIRTH:  [birth year]  SUBJECTIVE: She is admitted for altered mental status. Right-sided weakness found to have multiple infarcts on the left side. Patient seen today morning and she is alert but having trouble getting words out. And does have right-sided weakness.   CHIEF COMPLAINT:   Chief Complaint  Patient presents with  . Altered Mental Status   - Patient admitted secondary to altered mental status. Aphasic now, also has right-sided weakness. -At baseline, patient is very communicative, alert and ambulates with a walker  REVIEW OF SYSTEMS:  Review of Systems  Constitutional: Negative for fever and chills.  HENT: Negative for hearing loss.   Eyes: Negative for blurred vision, double vision and photophobia.  Respiratory: Negative for cough, hemoptysis and shortness of breath.   Cardiovascular: Negative for palpitations, orthopnea and leg swelling.  Gastrointestinal: Negative for vomiting, abdominal pain and diarrhea.  Genitourinary: Negative for dysuria and urgency.  Musculoskeletal: Negative for myalgias and neck pain.  Skin: Negative for rash.  Neurological: Positive for speech change and focal weakness. Negative for dizziness, seizures, weakness and headaches.       Right-sided hemiplegia, slurred speech noted.  Psychiatric/Behavioral: Negative for memory loss. The patient does not have insomnia.   Dysarthria  DRUG ALLERGIES:   Allergies  Allergen Reactions  . Ciprocinonide [Fluocinolone] Hives  . Demerol [Meperidine] Nausea And Vomiting  . Diflucan [Fluconazole] Nausea Only  . Flagyl [Metronidazole] Hives and Itching  . Hydrocodone Itching  . Levaquin [Levofloxacin In D5w] Other (See Comments)    Numbness in legs  . Morphine And Related Nausea And Vomiting  . Tetracyclines & Related Itching and Swelling  . Valium [Diazepam] Nausea Only     hallucinations    VITALS:  Blood pressure 126/91, pulse 80, temperature 97.7 F (36.5 C), temperature source Oral, resp. rate 20, height 5\' 4"  (1.626 m), weight 49.76 kg (109 lb 11.2 oz), SpO2 100 %.  PHYSICAL EXAMINATION:  Physical Exam  Constitutional: She is oriented to person, place, and time and well-developed, well-nourished, and in no distress.  HENT:  Head: Normocephalic.  Right Ear: External ear normal.  Mouth/Throat: Oropharynx is clear and moist.  Eyes: Conjunctivae and EOM are normal. Pupils are equal, round, and reactive to light.  Neck: Neck supple. No tracheal deviation present. No thyromegaly present.  Cardiovascular: Normal rate, regular rhythm and normal heart sounds.  Exam reveals no gallop.   No murmur heard. Pulmonary/Chest: She has no wheezes. She has no rales.  Abdominal: She exhibits no distension. There is no tenderness. There is no rebound.  Musculoskeletal: Normal range of motion.  Lymphadenopathy:    She has no cervical adenopathy.  Neurological: She is alert and oriented to person, place, and time. Coordination normal.  Overall the right-sided weakness on the upper and lower extremities. It is 2/5.  Skin: Skin is dry.  Psychiatric: Memory and affect normal.      LABORATORY PANEL:   CBC  Recent Labs Lab 12/11/15 0452  WBC 6.0  HGB 8.4*  HCT 25.8*  PLT 147*   ------------------------------------------------------------------------------------------------------------------  Chemistries   Recent Labs Lab 12/10/15 2343  12/12/15 0340  NA 136  < > 135  K 3.2*  < > 3.5  CL 97*  < > 99*  CO2 22  < > 21*  GLUCOSE 128*  < > 93  BUN 63*  < > 62*  CREATININE 9.58*  < > 9.59*  CALCIUM 7.7*  < > 8.2*  AST 23  --   --   ALT 14  --   --   ALKPHOS 104  --   --   BILITOT 0.9  --   --   < > = values in this interval not  displayed. ------------------------------------------------------------------------------------------------------------------  Cardiac Enzymes  Recent Labs Lab 12/11/15 1502  TROPONINI 4.79*   ------------------------------------------------------------------------------------------------------------------  RADIOLOGY:  Ct Head Wo Contrast  12/11/2015  CLINICAL DATA:  Larey Seat in bathroom. Slurred speech and altered mental status. History of hypertension, hyperlipidemia, and diabetes, end-stage renal disease on dialysis. EXAM: CT HEAD WITHOUT CONTRAST TECHNIQUE: Contiguous axial images were obtained from the base of the skull through the vertex without intravenous contrast. COMPARISON:  MRI of the brain November 17, 2015 FINDINGS: The ventricles and sulci are normal. No intraparenchymal hemorrhage, mass effect nor midline shift. No acute large vascular territory infarcts. Small area RIGHT frontal encephalomalacia. No abnormal extra-axial fluid collections. Basal cisterns are patent. A few tiny extra-axial punctate calcifications. No skull fracture. The included ocular globes and orbital contents are non-suspicious. The mastoid aircells and included paranasal sinuses are well-aerated. IMPRESSION: No acute intracranial process. Focal RIGHT frontal encephalomalacia may be posttraumatic or ischemic. Punctate extra-axial calcifications could be vascular. Electronically Signed   By: Awilda Metro M.D.   On: 12/11/2015 00:09   Ct Angio Neck W/cm &/or Wo/cm  12/11/2015  CLINICAL DATA:  Multiple acute LEFT hemisphere infarcts. Abnormal appearance of the LEFT internal carotid artery on MRI and MRA. Right-sided weakness with aphasia beginning yesterday. EXAM: CT ANGIOGRAPHY NECK TECHNIQUE: Multidetector CT imaging of the neck was performed using the standard protocol during bolus administration of intravenous contrast. Multiplanar CT image reconstructions and MIPs were obtained to evaluate the vascular  anatomy. Carotid stenosis measurements (when applicable) are obtained utilizing NASCET criteria, using the distal internal carotid diameter as the denominator. CONTRAST:  OMNIPAQUE IOHEXOL 350 MG/ML SOLN COMPARISON:  MRI and MRA earlier today.  CT head 12/10/2015. FINDINGS: Aortic arch: Standard branching. Imaged portion shows no evidence of aneurysm or dissection. No significant stenosis of the major arch vessel origins. Right carotid system: Minor calcific disease in the common carotid artery and bifurcation. Widely patent RIGHT ICA. No evidence of dissection, stenosis (50% or greater) or occlusion. Left carotid system: Moderate calcific and soft plaque is present in the bulb. In the cervical ICA at approximately the C2 level, the vessel caliber becomes diffusely small throughout the cervical segment. No skull base pseudoaneurysm. No intraluminal flap. The horizontal and vertical petrous segments of the LEFT ICA are patent but small but patent. No evidence of dissection, stenosis (50% or greater) or occlusion. Vertebral arteries: Codominant. No evidence of dissection, stenosis (50% or greater) or occlusion. Skeleton: Negative Other neck: No masses. No lung apex lesion. Mild apical pleural thickening. IMPRESSION: Normal RIGHT anterior circulation. Non stenotic calcific and soft plaque at the LEFT ICA origin, but no flow-limiting stenosis at that level nor is there strong evidence for LEFT ICA dissection. The cervical LEFT ICA becomes diffusely small but is patent at the C2 level extending Cephalad to the skullbase. This is favored to represent luminal narrowing due to severe intracranial stenosis, involving the LICA cavernous or supraclinoid level, or both. Electronically Signed   By: Elsie Stain M.D.   On: 12/11/2015 17:28   Mr Brain Wo Contrast  12/11/2015  CLINICAL DATA:  Patient with atrial fibrillation not on long-term anticoagulation, also end-stage renal  disease, hypertension, and diabetes,  presents with disorder of speech 12/10/2015. Also Syncopal episode. EXAM: MRI HEAD WITHOUT CONTRAST MRA HEAD WITHOUT CONTRAST TECHNIQUE: Multiplanar, multiecho pulse sequences of the brain and surrounding structures were obtained without intravenous contrast. Angiographic images of the head were obtained using MRA technique without contrast. COMPARISON:  MR head 11/17/2015.  CT head 12/10/2015. FINDINGS: MRI HEAD FINDINGS Multifocal areas of acute infarction affect the LEFT hemisphere, including LEFT frontal, temporal, parietal, and occipital lobes, LEFT caudate nucleus and lentiform nucleus, and regional subcortical greater than periventricular white matter. No posterior fossa involvement. No RIGHT hemisphere involvement. These could be related to multiple emboli from the diseased LEFT ICA described below, or watershed type phenomenon from hypoperfusion. Normal for age cerebral volume. Paucity of white matter disease given patient's comorbidities. Remote RIGHT posterior frontal and subcortical infarction. No significant chronic hemorrhage. No midline abnormality. Extracranial soft tissues unremarkable. No osseous findings MRA HEAD FINDINGS The RIGHT internal carotid artery is widely patent. The basilar artery is widely patent with RIGHT vertebral dominant. There is a 50% stenosis of the non dominant LEFT vertebral at its junction with basilar. The RIGHT M1 MCA is widely patent. Minor non stenotic narrowing of the proximal RIGHT A1 ACA. The anterior communicating artery poorly seen. There is apparent cross fill right-to-left supplying the anterior circulation. There is some flow related enhancement of the LEFT ICA in its cavernous and supraclinoid segment, but there is diminished to absent petrous LEFT ICA flow. Tapering flow related enhancement is observed in a small but patent LEFT cervical ICA. LEFT M1 MCA small but patent. Severe stenosis proximal A2 LEFT ACA. Severely diminished flow related enhancement in the  LEFT M2 and M3 vessels. No PCA stenosis or occlusion. No cerebellar branch occlusion. No intracranial aneurysm. Compared with the prior MRI from 11/17/2015, the infarctions are new, and the LEFT ICA appears to be further diminished caliber. IMPRESSION: Multifocal areas of acute infarction affecting the LEFT hemisphere without visible hemorrhage. It is unclear if these represent multiple emboli or watershed type phenomenon. No posterior fossa or RIGHT hemisphere involvement. Segmental areas of diminished or absent flow related enhancement in the LEFT ICA. String sign is suspected. Formal catheter angiogram recommended for further evaluation to potentially re-establish flow in a diseased vessel. Critical Value/emergent results were called by telephone at the time of interpretation on 12/11/2015 at 1:49 pm to Dr. Nemiah Commander , who verbally acknowledged these results. Electronically Signed   By: Elsie Stain M.D.   On: 12/11/2015 13:51   Mr Maxine Glenn Head/brain Wo Cm  12/11/2015  CLINICAL DATA:  Patient with atrial fibrillation not on long-term anticoagulation, also end-stage renal disease, hypertension, and diabetes, presents with disorder of speech 12/10/2015. Also Syncopal episode. EXAM: MRI HEAD WITHOUT CONTRAST MRA HEAD WITHOUT CONTRAST TECHNIQUE: Multiplanar, multiecho pulse sequences of the brain and surrounding structures were obtained without intravenous contrast. Angiographic images of the head were obtained using MRA technique without contrast. COMPARISON:  MR head 11/17/2015.  CT head 12/10/2015. FINDINGS: MRI HEAD FINDINGS Multifocal areas of acute infarction affect the LEFT hemisphere, including LEFT frontal, temporal, parietal, and occipital lobes, LEFT caudate nucleus and lentiform nucleus, and regional subcortical greater than periventricular white matter. No posterior fossa involvement. No RIGHT hemisphere involvement. These could be related to multiple emboli from the diseased LEFT ICA described below,  or watershed type phenomenon from hypoperfusion. Normal for age cerebral volume. Paucity of white matter disease given patient's comorbidities. Remote RIGHT posterior frontal and subcortical infarction. No significant chronic hemorrhage.  No midline abnormality. Extracranial soft tissues unremarkable. No osseous findings MRA HEAD FINDINGS The RIGHT internal carotid artery is widely patent. The basilar artery is widely patent with RIGHT vertebral dominant. There is a 50% stenosis of the non dominant LEFT vertebral at its junction with basilar. The RIGHT M1 MCA is widely patent. Minor non stenotic narrowing of the proximal RIGHT A1 ACA. The anterior communicating artery poorly seen. There is apparent cross fill right-to-left supplying the anterior circulation. There is some flow related enhancement of the LEFT ICA in its cavernous and supraclinoid segment, but there is diminished to absent petrous LEFT ICA flow. Tapering flow related enhancement is observed in a small but patent LEFT cervical ICA. LEFT M1 MCA small but patent. Severe stenosis proximal A2 LEFT ACA. Severely diminished flow related enhancement in the LEFT M2 and M3 vessels. No PCA stenosis or occlusion. No cerebellar branch occlusion. No intracranial aneurysm. Compared with the prior MRI from 11/17/2015, the infarctions are new, and the LEFT ICA appears to be further diminished caliber. IMPRESSION: Multifocal areas of acute infarction affecting the LEFT hemisphere without visible hemorrhage. It is unclear if these represent multiple emboli or watershed type phenomenon. No posterior fossa or RIGHT hemisphere involvement. Segmental areas of diminished or absent flow related enhancement in the LEFT ICA. String sign is suspected. Formal catheter angiogram recommended for further evaluation to potentially re-establish flow in a diseased vessel. Critical Value/emergent results were called by telephone at the time of interpretation on 12/11/2015 at 1:49 pm to  Dr. Nemiah CommanderKalisetti , who verbally acknowledged these results. Electronically Signed   By: Elsie StainJohn T Curnes M.D.   On: 12/11/2015 13:51    EKG:   Orders placed or performed during the hospital encounter of 12/10/15  . ED EKG  . ED EKG  . EKG 12-Lead  . EKG 12-Lead  . EKG 12-Lead  . EKG 12-Lead    ASSESSMENT AND PLAN:   54 year old female with multiple medical problems including COPD, end-stage renal disease on peritoneal dialysis, hypertension, hyperlipidemia, congestive heart failure with systolic dysfunction, chronic anemia, history of atrial fibrillation, diabetes mellitus presents to the hospital secondary to change in mental status, speech and RIGHT-sided weakness.  #1 multifocal stroke: MRI of the brain showed a left MCA, PCA, MCA territories. Patient had a CT angiogram of the neck did not show any significant stenosis that needs to be operated. Continue aspirin, Plavix. Physical therapy consult speech therapy consult. Goal for BP check. No lymphadenopathy. Patient needs outpatient 30 day loop moniotr evaluation of atrial fibrillation.   appreciate Vascular, and neurology evaluations.   -Most recent echo 3 weeks ago with no clot. - #2 elevated troponin-possibly demand ischemia. Patient denies any chest pain. -Has stroke and also an end-stage renal dialysis patient. -Continue to monitor troponin.  #3 history of paroxysmal atrial fibrillation with 17 beats of nonsustained V. tach on telemetry. -Appreciate cardiology consult. -Metoprolol discontinued for now due to acute stroke and permissive hypertension requested Patient needs outpatient TEE. #4  end-stage renal disease on peritoneal dialysis-also has hemodialysis catheter. Prior history of peritonitis and abdominal hernias. -Nephrology consulted.  #5 anemia of chronic disease-stable. -With acute stroke, likely not a candidate for Procrit  #6 hypokalemia-per nephrology.  #7 tobacco use disorder-smoking cessation advised  # 8  DVT prophylaxis-on subcutaneous heparin Downgraded to regular stroke unit.  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  All the records are reviewed and case discussed  with Care Management/Social Workerr. Management plans discussed with the patient, family and they are in agreement.  CODE STATUS: Full Code  TOTAL TIME SPENT IN TAKING CARE OF THIS PATIENT: 45 minutes.   POSSIBLE D/C IN 2-3 DAYS, DEPENDING ON CLINICAL CONDITION.   Katha Hamming M.D on 12/12/2015 at 2:50 PM  Between 7am to 6pm - Pager - [phone]  After 6pm go to [email or web address] - password [email or web address] [facility]  [place]  [facility]  Office  [phone]  CC: Primary care physician; Sherlene Shams, MD

## 2015-12-12 NOTE — Consult Note (Signed)
CC: aphasia   HPI: Dorothy Long is an 54 y.o. female a known history of hypertension, hyperlipidemia, COPD, end-stage renal disease on peritoneal dialysis, congestive heart failure, anemia presented to the emergency room because of difficulty in speech and syncope.  As per family they heart a thud after which pt was dysarthric and had R sided weakness.    Exam much improved today still has R sided weakness. Appreciate vascular consultation.   Past Medical History  Diagnosis Date  . Hypertension   . Hyperlipidemia   . COPD (chronic obstructive pulmonary disease) (HCC)     a. not on home O2  . Anemia   . Valvular disease     a. 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 echo 2015: EF 45-50%, borderline LVH, mod dilated LA, mildly dilated RA, small pericardial effusion, mod MR (rheumatic deformity), MS mild-mod, no gradient, PASP ele  . Chronic combined systolic and diastolic CHF (congestive heart failure) (HCC)     a. echo reports above  . PAF (paroxysmal atrial fibrillation) (HCC)     a. in setting of diarrhea 09/21/2013; b. not on long term full dose anticoagulation; c. CHADSVASC at least 4 (CHF, HTN, DM, female)  . Diabetes mellitus (HCC)   . Peritoneal dialysis status (HCC) 4- 15-15  . Ventral hernia   . Inguinal hernia   . History of stress test     a. 2014: no convincing evidence of pharmacologically induced ischemia,  apparent ant apical defect present only on attenuation corrected images favored to reflect artifact due to overcorrection & may be worse on stress imaging secondary to greater patient motion. A small area of ischemia is felt less likely but is difficult to entirely exclude, EF 55%  . ESRD (end stage renal disease) on dialysis (HCC)     a. HD on T,T,S; b. 2/2 focal segmental  glomerulosclerosis   . Dialysis patient (HCC)   . Renal insufficiency     Past Surgical History  Procedure Laterality Date  . Abdominal hysterectomy    . Cholecystectomy    . Knee surgery Right   . Foot surgery Bilateral   . Av fistula placement Left 10-26-13    Dr Gilda CreaseSchnier  . Peritoneal catheter insertion  03-29-14    Dr Gilda CreaseSchnier  . Inguinal hernia repair Left 07/13/2015    Procedure: HERNIA REPAIR INGUINAL ADULT;  Surgeon: Earline MayotteJeffrey W Byrnett, MD;  Location: [facility];  Service: General;  Laterality: Left;  Marland Kitchen. Ventral hernia repair N/A 07/13/2015    Procedure: HERNIA REPAIR VENTRAL ADULT;  Surgeon: Earline MayotteJeffrey W Byrnett, MD;  Location: [facility];  Service: General;  Laterality: N/A;    Family History  Problem Relation Age of Onset  . Cancer Mother     colon  . Heart disease Father   . Hypertension Father     Social History:  reports that she has been smoking Cigarettes.  She has a 12.5 pack-year smoking history. She has never used smokeless tobacco. She reports that she does not drink alcohol or use illicit drugs.  Allergies  Allergen Reactions  . Ciprocinonide [Fluocinolone] Hives  . Demerol [Meperidine] Nausea And Vomiting  . Diflucan [Fluconazole] Nausea Only  . Flagyl [Metronidazole] Hives and Itching  . Hydrocodone Itching  . Levaquin [Levofloxacin In D5w] Other (See Comments)    Numbness in  legs  . Morphine And Related Nausea And Vomiting  . Tetracyclines & Related Itching and Swelling  . Valium [Diazepam] Nausea Only    hallucinations    Medications: I have reviewed the patient's current medications.  ROS: Unable to obtain due to dysarthria  Physical Examination: Blood pressure 126/91, pulse 80, temperature 97.7 F (36.5 C), temperature source Oral, resp. rate 20, height 5\' 4"  (1.626 m), weight 109 lb 11.2 oz (49.76 kg), SpO2 100 %.   Neurological Examination Mental Status: Speech much improved. Pt is following commands and able to articulate name and place.    Cranial Nerves: II: Discs flat bilaterally; Visual fields grossly normal, pupils equal, round, reactive to light and accommodation III,IV, VI: ptosis not present, extra-ocular motions intact bilaterally V,VII: smile symmetric, facial light touch sensation normal bilaterally VIII: hearing normal bilaterally IX,X: gag reflex present XI: bilateral shoulder shrug XII: midline tongue extension Motor: Right : Upper extremity   3/5    Left:     Upper extremity   4/5  Lower extremity   4/5     Lower extremity   4/5 Tone and bulk:normal tone throughout; no atrophy noted Sensory: Pinprick and light touch intact throughout, bilaterally Deep Tendon Reflexes: 2+ and symmetric throughout Plantars: Right: downgoing   Left: downgoing Cerebellar: Not tested  Gait: not tested       Laboratory Studies:   Basic Metabolic Panel:  Recent Labs Lab 12/10/15 2343 12/11/15 0452 12/12/15 0340  NA 136 136 135  K 3.2* 3.5 3.5  CL 97* 100* 99*  CO2 22 22 21*  GLUCOSE 128* 107* 93  BUN 63* 65* 62*  CREATININE 9.58* 9.71* 9.59*  CALCIUM 7.7* 7.8* 8.2*    Liver Function Tests:  Recent Labs Lab 12/10/15 2343  AST 23  ALT 14  ALKPHOS 104  BILITOT 0.9  PROT 6.2*  ALBUMIN 3.0*   No results for input(s): LIPASE, AMYLASE in the last 168 hours. No results for input(s): AMMONIA in the last 168 hours.  CBC:  Recent Labs Lab 12/10/15 2343 12/11/15 0452  WBC 6.3 6.0  NEUTROABS  --  4.9  HGB 9.2* 8.4*  HCT 29.1* 25.8*  MCV 88.2 86.2  PLT 150 147*    Cardiac Enzymes:  Recent Labs Lab 12/10/15 2343 12/11/15 0045 12/11/15 0854 12/11/15 1502  TROPONINI 0.25* 0.64* 3.18* 4.79*    BNP: Invalid input(s): POCBNP  CBG: No results for input(s): GLUCAP in the last 168 hours.  Microbiology: Results for orders placed or performed during the hospital encounter of 12/10/15  MRSA PCR Screening     Status: None   Collection Time: 12/11/15  8:19 PM  Result Value Ref Range Status   MRSA  by PCR NEGATIVE NEGATIVE Final    Comment:        The GeneXpert MRSA Assay (FDA approved for NASAL specimens only), is one component of a comprehensive MRSA colonization surveillance program. It is not intended to diagnose MRSA infection nor to guide or monitor treatment for MRSA infections.     Coagulation Studies:  Recent Labs  12/10/15 2344  LABPROT 13.9  INR 1.05    Urinalysis: No results for input(s): COLORURINE, LABSPEC, PHURINE, GLUCOSEU, HGBUR, BILIRUBINUR, KETONESUR, PROTEINUR, UROBILINOGEN, NITRITE, LEUKOCYTESUR in the last 168 hours.  Invalid input(s): APPERANCEUR  Lipid Panel:     Component Value Date/Time   CHOL 137 12/11/2015 0452   CHOL 120 05/03/2014 1816   TRIG 71 12/11/2015 0452   TRIG 120 05/03/2014 1816   HDL  42 12/11/2015 0452   HDL 44 05/03/2014 1816   CHOLHDL 3.3 12/11/2015 0452   VLDL 14 12/11/2015 0452   VLDL 24 05/03/2014 1816   LDLCALC 81 12/11/2015 0452   LDLCALC 52 05/03/2014 1816    HgbA1C: No results found for: HGBA1C  Urine Drug Screen:      Component Value Date/Time   LABOPIA NONE DETECTED 11/18/2015 1122   LABBENZ NONE DETECTED 11/18/2015 1122   AMPHETMU NONE DETECTED 11/18/2015 1122   THCU NONE DETECTED 11/18/2015 1122   LABBARB NONE DETECTED 11/18/2015 1122    Alcohol Level: No results for input(s): ETH in the last 168 hours.  Other results: EKG: normal EKG, normal sinus rhythm, unchanged from previous tracings.  Imaging: Ct Head Wo Contrast  12/11/2015  CLINICAL DATA:  Larey Seat in bathroom. Slurred speech and altered mental status. History of hypertension, hyperlipidemia, and diabetes, end-stage renal disease on dialysis. EXAM: CT HEAD WITHOUT CONTRAST TECHNIQUE: Contiguous axial images were obtained from the base of the skull through the vertex without intravenous contrast. COMPARISON:  MRI of the brain November 17, 2015 FINDINGS: The ventricles and sulci are normal. No intraparenchymal hemorrhage, mass effect nor midline  shift. No acute large vascular territory infarcts. Small area RIGHT frontal encephalomalacia. No abnormal extra-axial fluid collections. Basal cisterns are patent. A few tiny extra-axial punctate calcifications. No skull fracture. The included ocular globes and orbital contents are non-suspicious. The mastoid aircells and included paranasal sinuses are well-aerated. IMPRESSION: No acute intracranial process. Focal RIGHT frontal encephalomalacia may be posttraumatic or ischemic. Punctate extra-axial calcifications could be vascular. Electronically Signed   By: Awilda Metro M.D.   On: 12/11/2015 00:09   Ct Angio Neck W/cm &/or Wo/cm  12/11/2015  CLINICAL DATA:  Multiple acute LEFT hemisphere infarcts. Abnormal appearance of the LEFT internal carotid artery on MRI and MRA. Right-sided weakness with aphasia beginning yesterday. EXAM: CT ANGIOGRAPHY NECK TECHNIQUE: Multidetector CT imaging of the neck was performed using the standard protocol during bolus administration of intravenous contrast. Multiplanar CT image reconstructions and MIPs were obtained to evaluate the vascular anatomy. Carotid stenosis measurements (when applicable) are obtained utilizing NASCET criteria, using the distal internal carotid diameter as the denominator. CONTRAST:  OMNIPAQUE IOHEXOL 350 MG/ML SOLN COMPARISON:  MRI and MRA earlier today.  CT head 12/10/2015. FINDINGS: Aortic arch: Standard branching. Imaged portion shows no evidence of aneurysm or dissection. No significant stenosis of the major arch vessel origins. Right carotid system: Minor calcific disease in the common carotid artery and bifurcation. Widely patent RIGHT ICA. No evidence of dissection, stenosis (50% or greater) or occlusion. Left carotid system: Moderate calcific and soft plaque is present in the bulb. In the cervical ICA at approximately the C2 level, the vessel caliber becomes diffusely small throughout the cervical segment. No skull base pseudoaneurysm.  No intraluminal flap. The horizontal and vertical petrous segments of the LEFT ICA are patent but small but patent. No evidence of dissection, stenosis (50% or greater) or occlusion. Vertebral arteries: Codominant. No evidence of dissection, stenosis (50% or greater) or occlusion. Skeleton: Negative Other neck: No masses. No lung apex lesion. Mild apical pleural thickening. IMPRESSION: Normal RIGHT anterior circulation. Non stenotic calcific and soft plaque at the LEFT ICA origin, but no flow-limiting stenosis at that level nor is there strong evidence for LEFT ICA dissection. The cervical LEFT ICA becomes diffusely small but is patent at the C2 level extending Cephalad to the skullbase. This is favored to represent luminal narrowing due to severe intracranial  stenosis, involving the LICA cavernous or supraclinoid level, or both. Electronically Signed   By: Elsie Stain M.D.   On: 12/11/2015 17:28   Mr Brain Wo Contrast  12/11/2015  CLINICAL DATA:  Patient with atrial fibrillation not on long-term anticoagulation, also end-stage renal disease, hypertension, and diabetes, presents with disorder of speech 12/10/2015. Also Syncopal episode. EXAM: MRI HEAD WITHOUT CONTRAST MRA HEAD WITHOUT CONTRAST TECHNIQUE: Multiplanar, multiecho pulse sequences of the brain and surrounding structures were obtained without intravenous contrast. Angiographic images of the head were obtained using MRA technique without contrast. COMPARISON:  MR head 11/17/2015.  CT head 12/10/2015. FINDINGS: MRI HEAD FINDINGS Multifocal areas of acute infarction affect the LEFT hemisphere, including LEFT frontal, temporal, parietal, and occipital lobes, LEFT caudate nucleus and lentiform nucleus, and regional subcortical greater than periventricular white matter. No posterior fossa involvement. No RIGHT hemisphere involvement. These could be related to multiple emboli from the diseased LEFT ICA described below, or watershed type phenomenon from  hypoperfusion. Normal for age cerebral volume. Paucity of white matter disease given patient's comorbidities. Remote RIGHT posterior frontal and subcortical infarction. No significant chronic hemorrhage. No midline abnormality. Extracranial soft tissues unremarkable. No osseous findings MRA HEAD FINDINGS The RIGHT internal carotid artery is widely patent. The basilar artery is widely patent with RIGHT vertebral dominant. There is a 50% stenosis of the non dominant LEFT vertebral at its junction with basilar. The RIGHT M1 MCA is widely patent. Minor non stenotic narrowing of the proximal RIGHT A1 ACA. The anterior communicating artery poorly seen. There is apparent cross fill right-to-left supplying the anterior circulation. There is some flow related enhancement of the LEFT ICA in its cavernous and supraclinoid segment, but there is diminished to absent petrous LEFT ICA flow. Tapering flow related enhancement is observed in a small but patent LEFT cervical ICA. LEFT M1 MCA small but patent. Severe stenosis proximal A2 LEFT ACA. Severely diminished flow related enhancement in the LEFT M2 and M3 vessels. No PCA stenosis or occlusion. No cerebellar branch occlusion. No intracranial aneurysm. Compared with the prior MRI from 11/17/2015, the infarctions are new, and the LEFT ICA appears to be further diminished caliber. IMPRESSION: Multifocal areas of acute infarction affecting the LEFT hemisphere without visible hemorrhage. It is unclear if these represent multiple emboli or watershed type phenomenon. No posterior fossa or RIGHT hemisphere involvement. Segmental areas of diminished or absent flow related enhancement in the LEFT ICA. String sign is suspected. Formal catheter angiogram recommended for further evaluation to potentially re-establish flow in a diseased vessel. Critical Value/emergent results were called by telephone at the time of interpretation on 12/11/2015 at 1:49 pm to Dr. Nemiah Commander , who verbally  acknowledged these results. Electronically Signed   By: Elsie Stain M.D.   On: 12/11/2015 13:51   Mr Maxine Glenn Head/brain Wo Cm  12/11/2015  CLINICAL DATA:  Patient with atrial fibrillation not on long-term anticoagulation, also end-stage renal disease, hypertension, and diabetes, presents with disorder of speech 12/10/2015. Also Syncopal episode. EXAM: MRI HEAD WITHOUT CONTRAST MRA HEAD WITHOUT CONTRAST TECHNIQUE: Multiplanar, multiecho pulse sequences of the brain and surrounding structures were obtained without intravenous contrast. Angiographic images of the head were obtained using MRA technique without contrast. COMPARISON:  MR head 11/17/2015.  CT head 12/10/2015. FINDINGS: MRI HEAD FINDINGS Multifocal areas of acute infarction affect the LEFT hemisphere, including LEFT frontal, temporal, parietal, and occipital lobes, LEFT caudate nucleus and lentiform nucleus, and regional subcortical greater than periventricular white matter. No posterior fossa involvement. No RIGHT  hemisphere involvement. These could be related to multiple emboli from the diseased LEFT ICA described below, or watershed type phenomenon from hypoperfusion. Normal for age cerebral volume. Paucity of white matter disease given patient's comorbidities. Remote RIGHT posterior frontal and subcortical infarction. No significant chronic hemorrhage. No midline abnormality. Extracranial soft tissues unremarkable. No osseous findings MRA HEAD FINDINGS The RIGHT internal carotid artery is widely patent. The basilar artery is widely patent with RIGHT vertebral dominant. There is a 50% stenosis of the non dominant LEFT vertebral at its junction with basilar. The RIGHT M1 MCA is widely patent. Minor non stenotic narrowing of the proximal RIGHT A1 ACA. The anterior communicating artery poorly seen. There is apparent cross fill right-to-left supplying the anterior circulation. There is some flow related enhancement of the LEFT ICA in its cavernous and  supraclinoid segment, but there is diminished to absent petrous LEFT ICA flow. Tapering flow related enhancement is observed in a small but patent LEFT cervical ICA. LEFT M1 MCA small but patent. Severe stenosis proximal A2 LEFT ACA. Severely diminished flow related enhancement in the LEFT M2 and M3 vessels. No PCA stenosis or occlusion. No cerebellar branch occlusion. No intracranial aneurysm. Compared with the prior MRI from 11/17/2015, the infarctions are new, and the LEFT ICA appears to be further diminished caliber. IMPRESSION: Multifocal areas of acute infarction affecting the LEFT hemisphere without visible hemorrhage. It is unclear if these represent multiple emboli or watershed type phenomenon. No posterior fossa or RIGHT hemisphere involvement. Segmental areas of diminished or absent flow related enhancement in the LEFT ICA. String sign is suspected. Formal catheter angiogram recommended for further evaluation to potentially re-establish flow in a diseased vessel. Critical Value/emergent results were called by telephone at the time of interpretation on 12/11/2015 at 1:49 pm to Dr. Nemiah Commander , who verbally acknowledged these results. Electronically Signed   By: Elsie Stain M.D.   On: 12/11/2015 13:51     Assessment/Plan:   54 y.o. female a known history of hypertension, hyperlipidemia, COPD, end-stage renal disease on peritoneal dialysis, congestive heart failure, anemia presented to the emergency room because of difficulty in speech and syncope.  As per family they heart a thud after which pt was dysarthric and had L sided weakness.   Exam much improved today still has R sided weakness. Appreciate vascular consultation.   MRI L cereblal infarctions in the MCA/PCA, ACA/MCA territory.   CTA reviewed.   Dual anti platelet therapy with ASA and plavix.   Would consider monitor as out pt monitoring for hx of A-fib but this is likely carotid related  Speech much improved.      Pauletta Browns    12/12/2015, 2:42 PM

## 2015-12-12 NOTE — Progress Notes (Signed)
[facility]  ROUNDING NOTE   Subjective:  Patient moved over to stepdown for close observation. She is arousable this a.m. and will follow simple commands with all 4 extremities. She has tolerated peritoneal dialysis well overnight.   Objective:  Vital signs in last 24 hours:  Temp:  [97.5 F (36.4 C)-98.5 F (36.9 C)] 97.5 F (36.4 C) (12/28 0700) Pulse Rate:  [76-100] 84 (12/28 0700) Resp:  [13-36] 15 (12/28 0700) BP: (101-136)/(73-91) 120/83 mmHg (12/28 0400) SpO2:  [94 %-100 %] 95 % (12/28 0700) Arterial Line BP: (103-139)/(55-75) 123/68 mmHg (12/28 0700) Weight:  [49 kg (108 lb 0.4 oz)] 49 kg (108 lb 0.4 oz) (12/28 0400)  Weight change: -5.84 kg (-12 lb 14 oz) Filed Weights   12/10/15 2327 12/11/15 0327 12/12/15 0400  Weight: 54.84 kg (120 lb 14.4 oz) 51.166 kg (112 lb 12.8 oz) 49 kg (108 lb 0.4 oz)    Intake/Output:     Intake/Output this shift:     Physical Exam: General: NAD, resting in bed, confused.  Head: Normocephalic, atraumatic. Moist oral mucosal membranes  Eyes: Anicteric  Neck: Supple, trachea midline  Lungs:  Clear to auscultation normal effort  Heart: S1S2 no rubs  Abdomen:  Soft, nontender, BS present   Extremities: no peripheral edema.  Neurologic: Awake, confused, follows commands with all 4 extremities   Skin: No lesions  Access: PD catheter in place    Basic Metabolic Panel:  Recent Labs Lab 12/10/15 2343 12/11/15 0452 12/12/15 0340  NA 136 136 135  K 3.2* 3.5 3.5  CL 97* 100* 99*  CO2 22 22 21*  GLUCOSE 128* 107* 93  BUN 63* 65* 62*  CREATININE 9.58* 9.71* 9.59*  CALCIUM 7.7* 7.8* 8.2*    Liver Function Tests:  Recent Labs Lab 12/10/15 2343  AST 23  ALT 14  ALKPHOS 104  BILITOT 0.9  PROT 6.2*  ALBUMIN 3.0*   No results for input(s): LIPASE, AMYLASE in the last 168 hours. No results for input(s): AMMONIA in the last 168 hours.  CBC:  Recent Labs Lab 12/10/15 2343 12/11/15 0452  WBC 6.3 6.0   NEUTROABS  --  4.9  HGB 9.2* 8.4*  HCT 29.1* 25.8*  MCV 88.2 86.2  PLT 150 147*    Cardiac Enzymes:  Recent Labs Lab 12/10/15 2343 12/11/15 0045 12/11/15 0854 12/11/15 1502  TROPONINI 0.25* 0.64* 3.18* 4.79*    BNP: Invalid input(s): POCBNP  CBG: No results for input(s): GLUCAP in the last 168 hours.  Microbiology: Results for orders placed or performed during the hospital encounter of 12/10/15  MRSA PCR Screening     Status: None   Collection Time: 12/11/15  8:19 PM  Result Value Ref Range Status   MRSA by PCR NEGATIVE NEGATIVE Final    Comment:        The GeneXpert MRSA Assay (FDA approved for NASAL specimens only), is one component of a comprehensive MRSA colonization surveillance program. It is not intended to diagnose MRSA infection nor to guide or monitor treatment for MRSA infections.     Coagulation Studies:  Recent Labs  12/10/15 2344  LABPROT 13.9  INR 1.05    Urinalysis: No results for input(s): COLORURINE, LABSPEC, PHURINE, GLUCOSEU, HGBUR, BILIRUBINUR, KETONESUR, PROTEINUR, UROBILINOGEN, NITRITE, LEUKOCYTESUR in the last 72 hours.  Invalid input(s): APPERANCEUR    Imaging: Ct Head Wo Contrast  12/11/2015  CLINICAL DATA:  Larey Seat in bathroom. Slurred speech and altered mental status. History of hypertension, hyperlipidemia, and diabetes, end-stage  renal disease on dialysis. EXAM: CT HEAD WITHOUT CONTRAST TECHNIQUE: Contiguous axial images were obtained from the base of the skull through the vertex without intravenous contrast. COMPARISON:  MRI of the brain November 17, 2015 FINDINGS: The ventricles and sulci are normal. No intraparenchymal hemorrhage, mass effect nor midline shift. No acute large vascular territory infarcts. Small area RIGHT frontal encephalomalacia. No abnormal extra-axial fluid collections. Basal cisterns are patent. A few tiny extra-axial punctate calcifications. No skull fracture. The included ocular globes and orbital  contents are non-suspicious. The mastoid aircells and included paranasal sinuses are well-aerated. IMPRESSION: No acute intracranial process. Focal RIGHT frontal encephalomalacia may be posttraumatic or ischemic. Punctate extra-axial calcifications could be vascular. Electronically Signed   By: Awilda Metro M.D.   On: 12/11/2015 00:09   Ct Angio Neck W/cm &/or Wo/cm  12/11/2015  CLINICAL DATA:  Multiple acute LEFT hemisphere infarcts. Abnormal appearance of the LEFT internal carotid artery on MRI and MRA. Right-sided weakness with aphasia beginning yesterday. EXAM: CT ANGIOGRAPHY NECK TECHNIQUE: Multidetector CT imaging of the neck was performed using the standard protocol during bolus administration of intravenous contrast. Multiplanar CT image reconstructions and MIPs were obtained to evaluate the vascular anatomy. Carotid stenosis measurements (when applicable) are obtained utilizing NASCET criteria, using the distal internal carotid diameter as the denominator. CONTRAST:  OMNIPAQUE IOHEXOL 350 MG/ML SOLN COMPARISON:  MRI and MRA earlier today.  CT head 12/10/2015. FINDINGS: Aortic arch: Standard branching. Imaged portion shows no evidence of aneurysm or dissection. No significant stenosis of the major arch vessel origins. Right carotid system: Minor calcific disease in the common carotid artery and bifurcation. Widely patent RIGHT ICA. No evidence of dissection, stenosis (50% or greater) or occlusion. Left carotid system: Moderate calcific and soft plaque is present in the bulb. In the cervical ICA at approximately the C2 level, the vessel caliber becomes diffusely small throughout the cervical segment. No skull base pseudoaneurysm. No intraluminal flap. The horizontal and vertical petrous segments of the LEFT ICA are patent but small but patent. No evidence of dissection, stenosis (50% or greater) or occlusion. Vertebral arteries: Codominant. No evidence of dissection, stenosis (50% or greater)  or occlusion. Skeleton: Negative Other neck: No masses. No lung apex lesion. Mild apical pleural thickening. IMPRESSION: Normal RIGHT anterior circulation. Non stenotic calcific and soft plaque at the LEFT ICA origin, but no flow-limiting stenosis at that level nor is there strong evidence for LEFT ICA dissection. The cervical LEFT ICA becomes diffusely small but is patent at the C2 level extending Cephalad to the skullbase. This is favored to represent luminal narrowing due to severe intracranial stenosis, involving the LICA cavernous or supraclinoid level, or both. Electronically Signed   By: Elsie Stain M.D.   On: 12/11/2015 17:28   Mr Brain Wo Contrast  12/11/2015  CLINICAL DATA:  Patient with atrial fibrillation not on long-term anticoagulation, also end-stage renal disease, hypertension, and diabetes, presents with disorder of speech 12/10/2015. Also Syncopal episode. EXAM: MRI HEAD WITHOUT CONTRAST MRA HEAD WITHOUT CONTRAST TECHNIQUE: Multiplanar, multiecho pulse sequences of the brain and surrounding structures were obtained without intravenous contrast. Angiographic images of the head were obtained using MRA technique without contrast. COMPARISON:  MR head 11/17/2015.  CT head 12/10/2015. FINDINGS: MRI HEAD FINDINGS Multifocal areas of acute infarction affect the LEFT hemisphere, including LEFT frontal, temporal, parietal, and occipital lobes, LEFT caudate nucleus and lentiform nucleus, and regional subcortical greater than periventricular white matter. No posterior fossa involvement. No RIGHT hemisphere involvement. These could  be related to multiple emboli from the diseased LEFT ICA described below, or watershed type phenomenon from hypoperfusion. Normal for age cerebral volume. Paucity of white matter disease given patient's comorbidities. Remote RIGHT posterior frontal and subcortical infarction. No significant chronic hemorrhage. No midline abnormality. Extracranial soft tissues unremarkable. No  osseous findings MRA HEAD FINDINGS The RIGHT internal carotid artery is widely patent. The basilar artery is widely patent with RIGHT vertebral dominant. There is a 50% stenosis of the non dominant LEFT vertebral at its junction with basilar. The RIGHT M1 MCA is widely patent. Minor non stenotic narrowing of the proximal RIGHT A1 ACA. The anterior communicating artery poorly seen. There is apparent cross fill right-to-left supplying the anterior circulation. There is some flow related enhancement of the LEFT ICA in its cavernous and supraclinoid segment, but there is diminished to absent petrous LEFT ICA flow. Tapering flow related enhancement is observed in a small but patent LEFT cervical ICA. LEFT M1 MCA small but patent. Severe stenosis proximal A2 LEFT ACA. Severely diminished flow related enhancement in the LEFT M2 and M3 vessels. No PCA stenosis or occlusion. No cerebellar branch occlusion. No intracranial aneurysm. Compared with the prior MRI from 11/17/2015, the infarctions are new, and the LEFT ICA appears to be further diminished caliber. IMPRESSION: Multifocal areas of acute infarction affecting the LEFT hemisphere without visible hemorrhage. It is unclear if these represent multiple emboli or watershed type phenomenon. No posterior fossa or RIGHT hemisphere involvement. Segmental areas of diminished or absent flow related enhancement in the LEFT ICA. String sign is suspected. Formal catheter angiogram recommended for further evaluation to potentially re-establish flow in a diseased vessel. Critical Value/emergent results were called by telephone at the time of interpretation on 12/11/2015 at 1:49 pm to Dr. Nemiah Commander , who verbally acknowledged these results. Electronically Signed   By: Elsie Stain M.D.   On: 12/11/2015 13:51   Mr Maxine Glenn Head/brain Wo Cm  12/11/2015  CLINICAL DATA:  Patient with atrial fibrillation not on long-term anticoagulation, also end-stage renal disease, hypertension, and  diabetes, presents with disorder of speech 12/10/2015. Also Syncopal episode. EXAM: MRI HEAD WITHOUT CONTRAST MRA HEAD WITHOUT CONTRAST TECHNIQUE: Multiplanar, multiecho pulse sequences of the brain and surrounding structures were obtained without intravenous contrast. Angiographic images of the head were obtained using MRA technique without contrast. COMPARISON:  MR head 11/17/2015.  CT head 12/10/2015. FINDINGS: MRI HEAD FINDINGS Multifocal areas of acute infarction affect the LEFT hemisphere, including LEFT frontal, temporal, parietal, and occipital lobes, LEFT caudate nucleus and lentiform nucleus, and regional subcortical greater than periventricular white matter. No posterior fossa involvement. No RIGHT hemisphere involvement. These could be related to multiple emboli from the diseased LEFT ICA described below, or watershed type phenomenon from hypoperfusion. Normal for age cerebral volume. Paucity of white matter disease given patient's comorbidities. Remote RIGHT posterior frontal and subcortical infarction. No significant chronic hemorrhage. No midline abnormality. Extracranial soft tissues unremarkable. No osseous findings MRA HEAD FINDINGS The RIGHT internal carotid artery is widely patent. The basilar artery is widely patent with RIGHT vertebral dominant. There is a 50% stenosis of the non dominant LEFT vertebral at its junction with basilar. The RIGHT M1 MCA is widely patent. Minor non stenotic narrowing of the proximal RIGHT A1 ACA. The anterior communicating artery poorly seen. There is apparent cross fill right-to-left supplying the anterior circulation. There is some flow related enhancement of the LEFT ICA in its cavernous and supraclinoid segment, but there is diminished to absent petrous LEFT ICA  flow. Tapering flow related enhancement is observed in a small but patent LEFT cervical ICA. LEFT M1 MCA small but patent. Severe stenosis proximal A2 LEFT ACA. Severely diminished flow related  enhancement in the LEFT M2 and M3 vessels. No PCA stenosis or occlusion. No cerebellar branch occlusion. No intracranial aneurysm. Compared with the prior MRI from 11/17/2015, the infarctions are new, and the LEFT ICA appears to be further diminished caliber. IMPRESSION: Multifocal areas of acute infarction affecting the LEFT hemisphere without visible hemorrhage. It is unclear if these represent multiple emboli or watershed type phenomenon. No posterior fossa or RIGHT hemisphere involvement. Segmental areas of diminished or absent flow related enhancement in the LEFT ICA. String sign is suspected. Formal catheter angiogram recommended for further evaluation to potentially re-establish flow in a diseased vessel. Critical Value/emergent results were called by telephone at the time of interpretation on 12/11/2015 at 1:49 pm to Dr. Nemiah CommanderKalisetti , who verbally acknowledged these results. Electronically Signed   By: Elsie StainJohn T Curnes M.D.   On: 12/11/2015 13:51     Medications:     . aspirin EC  81 mg Oral Daily  . atorvastatin  20 mg Oral Daily  . calcium acetate  667 mg Oral TID  . clopidogrel  75 mg Oral Daily  . dialysis solution 1.5% low-MG/low-CA   Intraperitoneal Q24H  . gentamicin cream  1 application Topical Daily  . heparin  5,000 Units Subcutaneous 3 times per day  . sevelamer carbonate  1,600 mg Oral TID WC  . tiotropium  18 mcg Inhalation Daily   acetaminophen **OR** acetaminophen, albuterol, ALPRAZolam, ondansetron (ZOFRAN) IV, senna-docusate  Assessment/ Plan:  54 y.o. female with PMHx of of hypertension, hyperlipidemia, vitamin D deficiency, tobacco abuse, edema, cholecystectomy,hysterectomy, ESRD secondary to FSGS, SHPTH, severe mitral stenosis, Liver cirrhosis, inguinal hernia repair.  ADmitted 12/10/15 with fall from home and found to have multiple acute areas of infarction in the left cerebral hemisphere.  1.  ESRD on PD: Patient tolerating peritoneal dialysis well. Continue peritoneal  dialysis using 1.5% dextrose solution as she does not require significant ultrafiltration at this time.  2.  Anemia chronic kidney disease.  Given multiple areas of cerebral infarction we will avoid Epogen at this time.  3.  Secondary hyperparathyroidism.  Follow-up phosphorus today. Continue Renvela and PhosLo for now.  4.  Acute CVA.  Multiple areas of acute infarction involving the left hemisphere.  Appreciate vascular surgery consultation. They have recommended maximal medical care.   LOS: 1 Hatcher Froning 12/28/20167:40 AM

## 2015-12-13 ENCOUNTER — Inpatient Hospital Stay (HOSPITAL_COMMUNITY)
Admit: 2015-12-13 | Discharge: 2015-12-13 | Disposition: A | Discharge status: Home / Self Care | Payer: BC Managed Care – PPO | Attending: Physician Assistant | Admitting: Physician Assistant

## 2015-12-13 ENCOUNTER — Encounter: Payer: Self-pay | Admitting: Physician Assistant

## 2015-12-13 DIAGNOSIS — R778 Other specified abnormalities of plasma proteins: Secondary | ICD-10-CM | POA: Insufficient documentation

## 2015-12-13 DIAGNOSIS — I420 Dilated cardiomyopathy: Secondary | ICD-10-CM | POA: Insufficient documentation

## 2015-12-13 DIAGNOSIS — R471 Dysarthria and anarthria: Secondary | ICD-10-CM

## 2015-12-13 DIAGNOSIS — I6522 Occlusion and stenosis of left carotid artery: Secondary | ICD-10-CM

## 2015-12-13 DIAGNOSIS — I050 Rheumatic mitral stenosis: Secondary | ICD-10-CM

## 2015-12-13 DIAGNOSIS — R7989 Other specified abnormal findings of blood chemistry: Secondary | ICD-10-CM | POA: Insufficient documentation

## 2015-12-13 DIAGNOSIS — I6529 Occlusion and stenosis of unspecified carotid artery: Secondary | ICD-10-CM | POA: Insufficient documentation

## 2015-12-13 DIAGNOSIS — R410 Disorientation, unspecified: Secondary | ICD-10-CM | POA: Insufficient documentation

## 2015-12-13 DIAGNOSIS — R4701 Aphasia: Secondary | ICD-10-CM

## 2015-12-13 DIAGNOSIS — N186 End stage renal disease: Secondary | ICD-10-CM

## 2015-12-13 DIAGNOSIS — I639 Cerebral infarction, unspecified: Secondary | ICD-10-CM

## 2015-12-13 DIAGNOSIS — Z992 Dependence on renal dialysis: Secondary | ICD-10-CM

## 2015-12-13 LAB — URINALYSIS COMPLETE WITH MICROSCOPIC (ARMC ONLY)
Bilirubin Urine: NEGATIVE
Glucose, UA: NEGATIVE mg/dL
Ketones, ur: NEGATIVE mg/dL
LEUKOCYTES UA: NEGATIVE
Nitrite: NEGATIVE
PH: 5 (ref 5.0–8.0)
PROTEIN: 100 mg/dL — AB
SPECIFIC GRAVITY, URINE: 1.024 (ref 1.005–1.030)

## 2015-12-13 LAB — PHOSPHORUS: PHOSPHORUS: 9.3 mg/dL — AB (ref 2.5–4.6)

## 2015-12-13 MED ORDER — MORPHINE SULFATE (PF) 2 MG/ML IV SOLN
2.0000 mg | INTRAVENOUS | Status: DC | PRN
  Administered 2015-12-13 – 2015-12-18 (×7): 2 mg via INTRAVENOUS
  Filled 2015-12-13 (×7): qty 1

## 2015-12-13 MED ORDER — ENSURE ENLIVE PO LIQD
237.0000 mL | ORAL | Status: DC
  Administered 2015-12-14 – 2015-12-17 (×3): 237 mL via ORAL

## 2015-12-13 MED ORDER — SODIUM CHLORIDE 0.9 % IJ SOLN
3.0000 mL | Freq: Two times a day (BID) | INTRAMUSCULAR | Status: DC
  Administered 2015-12-13 – 2015-12-19 (×12): 3 mL via INTRAVENOUS

## 2015-12-13 NOTE — Evaluation (Signed)
Occupational Therapy Evaluation Patient Details Name: Dorothy Long MRN: [id number] DOB: 1961-04-30 Today's Date: 12/13/2015    History of Present Illness presented to ER after being found on floor by husband, + LOC, noted with impaired speech and R-sided weakness upon awakening; admitted for acute CVA.  MRI significant for multifocal infarcts throughout L hemisphere.  Of note, patient with elevated troponin (peak 4.79), likely demand ischemia due to acute CVA per cardiology; no invasive work-up recommneded at this time.   Clinical Impression   Pt is 54 year old woman who presents with acute CVA with MIR indicating multifocal infarcts throughout L hemisphere and troponin elevated (peak 4.79), likely demand ischemia due to CVA per cardiology. Pt is also on dialysis.  She was agreeable to therapy and was able to tolerate sitting EOB for 15 minutes to assess ADLs and BUE function.  She is right hand dominant but since CVA, she is now been using her L hand for self feeding which requires mod to max assist depending on her fatigue level.  She requires mod assist for grooming skills and max assist for LB dressing skills due to decreased balance, decreased sequencing, decreased problem solving and impaired gross and fine motor skills of RUE and hand.  No edema or pain in RUE or hand and family education on not to pull on RUE or hand when assisting her out of bed and to monitor signs of edema and pain.  Rec continued OT skills to increase functional use of RUE and hand for ADLs, family ed and training and CIR for continued intensive rehab.  Discussed this rec with her husband and several other family members in room with her.       Follow Up Recommendations  CIR    Equipment Recommendations   (to be further assessed)    Recommendations for Other Services       Precautions / Restrictions Precautions Precautions: Fall Precaution Comments: Seizure, L AVF (No BP L UE), Tenkoff catheter  (PD) Restrictions Weight Bearing Restrictions: No      Mobility Bed Mobility Overal bed mobility: Needs Assistance Bed Mobility: Supine to Sit     Supine to sit: Mod assist     General bed mobility comments: for truncal elevation with transition towards R  Transfers Overall transfer level: Needs assistance Equipment used: 1 person hand held assist Transfers: Sit to/from Stand Sit to Stand: Mod assist         General transfer comment: verbal cuing for R TKE in closed-chain position (requires cuing to initiate)    Balance Overall balance assessment: Needs assistance Sitting-balance support: No upper extremity supported;Feet supported Sitting balance-Leahy Scale: Fair     Standing balance support: Single extremity supported Standing balance-Leahy Scale: Poor                              ADL Overall ADL's : Needs assistance/impaired                                       General ADL Comments: Pt requires set and mod assist to max assist for self feeding using LUE and hand which is not her dominant hand, mod assist for groooming skills and max assist for LB bathing and dressing skills due to decreased sitting balance, decreased use of RUE and hand and LUE and hand weakness, decreased problem solving  and sequencing.  Functional endurance limited and was able to tolerate sitting EOB for 15 minutes and then needed a rest break to lay back down in bed.        Vision     Perception     Praxis      Pertinent Vitals/Pain Pain Assessment: No/denies pain     Hand Dominance Left (was R hand dominant but primarily uses L hand now due to CVA affecting RUE and hand)   Extremity/Trunk Assessment Upper Extremity Assessment Upper Extremity Assessment: RUE deficits/detail RUE Deficits / Details: Decreased AROM RUE and hand with shoulder flexion to 60 degrees, no IR or ER, minimal active wrist flexion and extension past 10 degrees, weak grasp, elbow  flexion uncoordinated 0-80 degrees and minimal active extension RUE:  (unable to assess sensation due to expressive aphasia and inconsistency of responses to questions) RUE Coordination: decreased fine motor;decreased gross motor LUE Deficits / Details:  (R LE grossly 3-/5 throughout with decreased coordination, speed of activationl; negative clonus, babinski; sensation grossly intact.  L LE grossly at least 4/5)   Lower Extremity Assessment Lower Extremity Assessment:  (Comment: R LE grossly 3-/5 throughout with decreased coordination, speed of activationl; negative clonus, babinski; sensation grossly intact.  L LE grossly at least 4/5)   Cervical / Trunk Assessment Cervical / Trunk Assessment:  (mild elongation of R lateral trunk noted)   Communication Communication Communication: Expressive difficulties   Cognition Arousal/Alertness: Awake/alert Behavior During Therapy: Flat affect;WFL for tasks assessed/performed Overall Cognitive Status: Within Functional Limits for tasks assessed Area of Impairment: Safety/judgement;Awareness;Problem solving         Safety/Judgement: Decreased awareness of safety   Problem Solving: Requires verbal cues;Requires tactile cues;Difficulty sequencing;Slow processing     General Comments       Exercises   Other Exercises Other Exercises: Unsupported sitting balance, close sup--verbal cuing for midline orientation Other Exercises: Sit/stand x3 with HHA, mod assist +1--emphasis on postural extension and R hip/knee extension in closed/chain position; progressed to include L LE forward/backward stepping with mod assist to control R hip/knee position (prevent recurvatum)   Shoulder Instructions      Home Living Family/patient expects to be discharged to:: Private residence Living Arrangements: Spouse/significant other Available Help at Discharge: Family Type of Home: House Home Access: Stairs to enter Secretary/administrator of Steps: 4 Entrance  Stairs-Rails: None Home Layout: One level     Bathroom Shower/Tub: Tub/shower unit Shower/tub characteristics: Engineer, building services: Standard     Home Equipment: Cane - single point          Prior Functioning/Environment Level of Independence: Independent        Comments: Indep with baseline mobility; has been utilizing [facility] since recent TIA (early Dec 2016) affecting L hemi-body    OT Diagnosis: Generalized weakness;Cognitive deficits;Hemiplegia dominant side   OT Problem List: Decreased strength;Decreased range of motion;Decreased activity tolerance;Decreased coordination;Impaired balance (sitting and/or standing);Decreased cognition;Impaired tone;Impaired UE functional use   OT Treatment/Interventions: Self-care/ADL training;Neuromuscular education;Therapeutic activities;Patient/family education    OT Goals(Current goals can be found in the care plan section) Acute Rehab OT Goals Patient Stated Goal: "I want to be better" OT Goal Formulation: With patient/family Time For Goal Achievement: 12/27/15 Potential to Achieve Goals: Good  OT Frequency: Other (comment) (rec OT 3-5 times a week but will see as frequently as possible per schedule)   Barriers to D/C:            Co-evaluation  End of Session    Activity Tolerance: Patient limited by fatigue Patient left: in bed;with call bell/phone within reach;with bed alarm set;with nursing/sitter in room (dialysis tech arrived to do dialysis in room)   Time: 1530-1600 OT Time Calculation (min): 30 min Charges:  OT General Charges $OT Visit: 1 Procedure OT Evaluation $Initial OT Evaluation Tier I: 1 Procedure OT Treatments $Self Care/Home Management : 8-22 mins G-Codes:    Wofford,Susan 2016/01/03, 5:23 PM   Susanne Borders, OTR/L ascom [phone]

## 2015-12-13 NOTE — Progress Notes (Signed)
Speech Language Pathology Treatment: Dysphagia  Patient Details Name: Dorothy Long MRN: [id number] DOB: [birth year] Today's Date: 12/13/2015 Time: 1435-1500 SLP Time Calculation (min) (ACUTE ONLY): 25 min  Assessment / Plan / Recommendation Clinical Impression  Pt given trials of solid, thin and mech soft. No overt s/s of aspiration were observed w/any tested consistency. Vocal quality remained clear throughout trials and no overt cough/throat clear noted. Pt able to masticate and clear solid and mech soft consistency well. No pocketing or holding observed. Noted word finding deficits as well as possible comprehension deficits in conversation with pt. Pt will likely benefit from Language eval in 1-2 days.    HPI HPI: Dorothy Long is a 54 y.o. female with a known history of hypertension, hyperlipidemia, COPD, end-stage renal disease on peritoneal dialysis, congestive heart failure, anemia presented to the emergency room because of difficulty in speech and syncope. Patient usually does peritoneal dialysis around 9 PM in the night every day. Yesterday she also hooked up for peritoneal dialysis, but could not complete it. Patient's husband found her on the floor with loss of consciousness. Patient's husband is at bedside and says it was no evidence of any seizure. After the patient woke up, she was not able to speak and she was brought to the emergency room. The above event happened around 9 PM last night. Tele neurology consult was done in the emergency room, they recommended baby aspirin and said patient is out of the window for any thrombolytic therapy. Upon evaluation in the ER, patient is able to move all extremities. She was able to express words and talk but had slurred speech and dysarthria. No tingling or numbness in any part of the body. No history of any head injury. Patient was recently worked up for stroke the first week of December at our hospital. Per nsg pt has not been eating very  much on Dysphagia III diet w/thin.      SLP Plan  Continue with current plan of care     Recommendations  Diet recommendations: Regular;Thin liquid Liquids provided via: Straw Medication Administration: Whole meds with puree Supervision: Patient able to self feed Compensations: Minimize environmental distractions;Slow rate;Small sips/bites Postural Changes and/or Swallow Maneuvers: Seated upright 90 degrees              Oral Care Recommendations: Oral care BID Follow up Recommendations: Skilled Nursing facility Plan: Continue with current plan of care   [place] 12/13/2015, 3:04 PM

## 2015-12-13 NOTE — Progress Notes (Signed)
[facility]  ROUNDING NOTE   Subjective:  Patient moves from stepdown unit. She is a bit more arousable today but still very confused. She is moving all 4 extremities upon command. She continues to undergo peritoneal dialysis.   Objective:  Vital signs in last 24 hours:  Temp:  [97.4 F (36.3 C)-98.3 F (36.8 C)] 97.6 F (36.4 C) (12/29 0752) Pulse Rate:  [66-90] 66 (12/29 0752) Resp:  [18-20] 18 (12/29 0752) BP: (121-130)/(84-95) 121/88 mmHg (12/29 0752) SpO2:  [100 %] 100 % (12/29 0752) Weight:  [49.76 kg (109 lb 11.2 oz)] 49.76 kg (109 lb 11.2 oz) (12/28 1317)  Weight change: 0.759 kg (1 lb 10.8 oz) Filed Weights   12/11/15 0327 12/12/15 0400 12/12/15 1317  Weight: 51.166 kg (112 lb 12.8 oz) 49 kg (108 lb 0.4 oz) 49.76 kg (109 lb 11.2 oz)    Intake/Output: I/O last 3 completed shifts: In: -  Out: 906 [Urine:60; Other:846]   Intake/Output this shift:  Total I/O In: 0  Out: 791 [Other:791]  Physical Exam: General: NAD, resting in bed, confused.  Head: Normocephalic, atraumatic. Moist oral mucosal membranes  Eyes: Anicteric  Neck: Supple, trachea midline  Lungs:  Clear to auscultation normal effort  Heart: S1S2 no rubs  Abdomen:  Soft, nontender, BS present   Extremities: no peripheral edema.  Neurologic: Awake, confused, follows commands with all 4 extremities   Skin: No lesions  Access: PD catheter in place    Basic Metabolic Panel:  Recent Labs Lab 12/10/15 2343 12/11/15 0452 12/12/15 0340  NA 136 136 135  K 3.2* 3.5 3.5  CL 97* 100* 99*  CO2 22 22 21*  GLUCOSE 128* 107* 93  BUN 63* 65* 62*  CREATININE 9.58* 9.71* 9.59*  CALCIUM 7.7* 7.8* 8.2*    Liver Function Tests:  Recent Labs Lab 12/10/15 2343  AST 23  ALT 14  ALKPHOS 104  BILITOT 0.9  PROT 6.2*  ALBUMIN 3.0*   No results for input(s): LIPASE, AMYLASE in the last 168 hours. No results for input(s): AMMONIA in the last 168 hours.  CBC:  Recent Labs Lab  12/10/15 2343 12/11/15 0452  WBC 6.3 6.0  NEUTROABS  --  4.9  HGB 9.2* 8.4*  HCT 29.1* 25.8*  MCV 88.2 86.2  PLT 150 147*    Cardiac Enzymes:  Recent Labs Lab 12/10/15 2343 12/11/15 0045 12/11/15 0854 12/11/15 1502  TROPONINI 0.25* 0.64* 3.18* 4.79*    BNP: Invalid input(s): POCBNP  CBG: No results for input(s): GLUCAP in the last 168 hours.  Microbiology: Results for orders placed or performed during the hospital encounter of 12/10/15  MRSA PCR Screening     Status: None   Collection Time: 12/11/15  8:19 PM  Result Value Ref Range Status   MRSA by PCR NEGATIVE NEGATIVE Final    Comment:        The GeneXpert MRSA Assay (FDA approved for NASAL specimens only), is one component of a comprehensive MRSA colonization surveillance program. It is not intended to diagnose MRSA infection nor to guide or monitor treatment for MRSA infections.     Coagulation Studies:  Recent Labs  12/10/15 2344  LABPROT 13.9  INR 1.05    Urinalysis:  Recent Labs  12/12/15 1814  COLORURINE YELLOW*  LABSPEC 1.024  PHURINE 5.0  GLUCOSEU NEGATIVE  HGBUR 1+*  BILIRUBINUR NEGATIVE  KETONESUR NEGATIVE  PROTEINUR 100*  NITRITE NEGATIVE  LEUKOCYTESUR NEGATIVE      Imaging: Ct Angio Neck W/cm &/  or Wo/cm  12/11/2015  CLINICAL DATA:  Multiple acute LEFT hemisphere infarcts. Abnormal appearance of the LEFT internal carotid artery on MRI and MRA. Right-sided weakness with aphasia beginning yesterday. EXAM: CT ANGIOGRAPHY NECK TECHNIQUE: Multidetector CT imaging of the neck was performed using the standard protocol during bolus administration of intravenous contrast. Multiplanar CT image reconstructions and MIPs were obtained to evaluate the vascular anatomy. Carotid stenosis measurements (when applicable) are obtained utilizing NASCET criteria, using the distal internal carotid diameter as the denominator. CONTRAST:  OMNIPAQUE IOHEXOL 350 MG/ML SOLN COMPARISON:  MRI and MRA  earlier today.  CT head 12/10/2015. FINDINGS: Aortic arch: Standard branching. Imaged portion shows no evidence of aneurysm or dissection. No significant stenosis of the major arch vessel origins. Right carotid system: Minor calcific disease in the common carotid artery and bifurcation. Widely patent RIGHT ICA. No evidence of dissection, stenosis (50% or greater) or occlusion. Left carotid system: Moderate calcific and soft plaque is present in the bulb. In the cervical ICA at approximately the C2 level, the vessel caliber becomes diffusely small throughout the cervical segment. No skull base pseudoaneurysm. No intraluminal flap. The horizontal and vertical petrous segments of the LEFT ICA are patent but small but patent. No evidence of dissection, stenosis (50% or greater) or occlusion. Vertebral arteries: Codominant. No evidence of dissection, stenosis (50% or greater) or occlusion. Skeleton: Negative Other neck: No masses. No lung apex lesion. Mild apical pleural thickening. IMPRESSION: Normal RIGHT anterior circulation. Non stenotic calcific and soft plaque at the LEFT ICA origin, but no flow-limiting stenosis at that level nor is there strong evidence for LEFT ICA dissection. The cervical LEFT ICA becomes diffusely small but is patent at the C2 level extending Cephalad to the skullbase. This is favored to represent luminal narrowing due to severe intracranial stenosis, involving the LICA cavernous or supraclinoid level, or both. Electronically Signed   By: Elsie Stain M.D.   On: 12/11/2015 17:28   Mr Brain Wo Contrast  12/11/2015  CLINICAL DATA:  Patient with atrial fibrillation not on long-term anticoagulation, also end-stage renal disease, hypertension, and diabetes, presents with disorder of speech 12/10/2015. Also Syncopal episode. EXAM: MRI HEAD WITHOUT CONTRAST MRA HEAD WITHOUT CONTRAST TECHNIQUE: Multiplanar, multiecho pulse sequences of the brain and surrounding structures were obtained without  intravenous contrast. Angiographic images of the head were obtained using MRA technique without contrast. COMPARISON:  MR head 11/17/2015.  CT head 12/10/2015. FINDINGS: MRI HEAD FINDINGS Multifocal areas of acute infarction affect the LEFT hemisphere, including LEFT frontal, temporal, parietal, and occipital lobes, LEFT caudate nucleus and lentiform nucleus, and regional subcortical greater than periventricular white matter. No posterior fossa involvement. No RIGHT hemisphere involvement. These could be related to multiple emboli from the diseased LEFT ICA described below, or watershed type phenomenon from hypoperfusion. Normal for age cerebral volume. Paucity of white matter disease given patient's comorbidities. Remote RIGHT posterior frontal and subcortical infarction. No significant chronic hemorrhage. No midline abnormality. Extracranial soft tissues unremarkable. No osseous findings MRA HEAD FINDINGS The RIGHT internal carotid artery is widely patent. The basilar artery is widely patent with RIGHT vertebral dominant. There is a 50% stenosis of the non dominant LEFT vertebral at its junction with basilar. The RIGHT M1 MCA is widely patent. Minor non stenotic narrowing of the proximal RIGHT A1 ACA. The anterior communicating artery poorly seen. There is apparent cross fill right-to-left supplying the anterior circulation. There is some flow related enhancement of the LEFT ICA in its cavernous and supraclinoid segment,  but there is diminished to absent petrous LEFT ICA flow. Tapering flow related enhancement is observed in a small but patent LEFT cervical ICA. LEFT M1 MCA small but patent. Severe stenosis proximal A2 LEFT ACA. Severely diminished flow related enhancement in the LEFT M2 and M3 vessels. No PCA stenosis or occlusion. No cerebellar branch occlusion. No intracranial aneurysm. Compared with the prior MRI from 11/17/2015, the infarctions are new, and the LEFT ICA appears to be further diminished  caliber. IMPRESSION: Multifocal areas of acute infarction affecting the LEFT hemisphere without visible hemorrhage. It is unclear if these represent multiple emboli or watershed type phenomenon. No posterior fossa or RIGHT hemisphere involvement. Segmental areas of diminished or absent flow related enhancement in the LEFT ICA. String sign is suspected. Formal catheter angiogram recommended for further evaluation to potentially re-establish flow in a diseased vessel. Critical Value/emergent results were called by telephone at the time of interpretation on 12/11/2015 at 1:49 pm to Dr. Nemiah CommanderKalisetti , who verbally acknowledged these results. Electronically Signed   By: Elsie StainJohn T Curnes M.D.   On: 12/11/2015 13:51   Mr Maxine GlennMra Head/brain Wo Cm  12/11/2015  CLINICAL DATA:  Patient with atrial fibrillation not on long-term anticoagulation, also end-stage renal disease, hypertension, and diabetes, presents with disorder of speech 12/10/2015. Also Syncopal episode. EXAM: MRI HEAD WITHOUT CONTRAST MRA HEAD WITHOUT CONTRAST TECHNIQUE: Multiplanar, multiecho pulse sequences of the brain and surrounding structures were obtained without intravenous contrast. Angiographic images of the head were obtained using MRA technique without contrast. COMPARISON:  MR head 11/17/2015.  CT head 12/10/2015. FINDINGS: MRI HEAD FINDINGS Multifocal areas of acute infarction affect the LEFT hemisphere, including LEFT frontal, temporal, parietal, and occipital lobes, LEFT caudate nucleus and lentiform nucleus, and regional subcortical greater than periventricular white matter. No posterior fossa involvement. No RIGHT hemisphere involvement. These could be related to multiple emboli from the diseased LEFT ICA described below, or watershed type phenomenon from hypoperfusion. Normal for age cerebral volume. Paucity of white matter disease given patient's comorbidities. Remote RIGHT posterior frontal and subcortical infarction. No significant chronic  hemorrhage. No midline abnormality. Extracranial soft tissues unremarkable. No osseous findings MRA HEAD FINDINGS The RIGHT internal carotid artery is widely patent. The basilar artery is widely patent with RIGHT vertebral dominant. There is a 50% stenosis of the non dominant LEFT vertebral at its junction with basilar. The RIGHT M1 MCA is widely patent. Minor non stenotic narrowing of the proximal RIGHT A1 ACA. The anterior communicating artery poorly seen. There is apparent cross fill right-to-left supplying the anterior circulation. There is some flow related enhancement of the LEFT ICA in its cavernous and supraclinoid segment, but there is diminished to absent petrous LEFT ICA flow. Tapering flow related enhancement is observed in a small but patent LEFT cervical ICA. LEFT M1 MCA small but patent. Severe stenosis proximal A2 LEFT ACA. Severely diminished flow related enhancement in the LEFT M2 and M3 vessels. No PCA stenosis or occlusion. No cerebellar branch occlusion. No intracranial aneurysm. Compared with the prior MRI from 11/17/2015, the infarctions are new, and the LEFT ICA appears to be further diminished caliber. IMPRESSION: Multifocal areas of acute infarction affecting the LEFT hemisphere without visible hemorrhage. It is unclear if these represent multiple emboli or watershed type phenomenon. No posterior fossa or RIGHT hemisphere involvement. Segmental areas of diminished or absent flow related enhancement in the LEFT ICA. String sign is suspected. Formal catheter angiogram recommended for further evaluation to potentially re-establish flow in a diseased vessel. Critical Value/emergent  results were called by telephone at the time of interpretation on 12/11/2015 at 1:49 pm to Dr. Nemiah Commander , who verbally acknowledged these results. Electronically Signed   By: Elsie Stain M.D.   On: 12/11/2015 13:51     Medications:     . aspirin EC  81 mg Oral Daily  . atorvastatin  20 mg Oral Daily  .  calcium acetate  667 mg Oral TID  . clopidogrel  75 mg Oral Daily  . dialysis solution 1.5% low-MG/low-CA   Intraperitoneal Q24H  . gentamicin cream  1 application Topical Daily  . heparin  5,000 Units Subcutaneous 3 times per day  . sevelamer carbonate  1,600 mg Oral TID WC  . tiotropium  18 mcg Inhalation Daily   acetaminophen **OR** acetaminophen, albuterol, ALPRAZolam, morphine injection, ondansetron (ZOFRAN) IV, senna-docusate  Assessment/ Plan:  54 y.o. female with PMHx of of hypertension, hyperlipidemia, vitamin D deficiency, tobacco abuse, edema, cholecystectomy,hysterectomy, ESRD secondary to FSGS, SHPTH, severe mitral stenosis, Liver cirrhosis, inguinal hernia repair.  ADmitted 12/10/15 with fall from home and found to have multiple acute areas of infarction in the left cerebral hemisphere.  1.  End-stage renal disease on peritoneal dialysis.  Continue current prescription of 1.5% dextrose solution.  Continue to monitor labs periodically.  2.  Anemia chronic kidney disease.  Hemoglobin currently 8.4.  We are avoiding Epogen given multiple areas of cerebral infarction.  3.  Secondary hyperparathyroidism.  Awaiting repeat phosphorus.  For now continue PhosLo and Renvela as the patient did pass her swallow study.  4.  Acute CVA.  Multiple areas of acute infarction involving the left hemisphere noted. Patient continues to have confusion and difficulty finding words.  However she is moving all 4 extremities.  She is currently on aspirin and Plavix. LOS: 2 Dorothy Long 12/29/201612:19 PM

## 2015-12-13 NOTE — Progress Notes (Signed)
*  PRELIMINARY RESULTS* Echocardiogram 2D Echocardiogram has been performed.  Dorothy HousekeeperJerry R Long 12/13/2015, 2:05 PM

## 2015-12-13 NOTE — Care Management (Signed)
Admitted to [facility] with the diagnosis of dysarthria. Discharged from this facility 11/20/15. Lives with husband, Lorenz CoasterDewight, [phone]). Followed by [facility] since last discharge. Last seen Dr. Darrick Huntsmanullo about a week ago. Uses a rolling walker to aid in ambulation. Skilled Nursing facility in the past, but doesn't remember name of facility. Peritoneal Dialysis x 2 years in the home. Back-up facility is [facility]. No falls. O.K. Appetite. Takes care of all basic activities of daily living herself. Cooks in the home, doesn't drive. Family/friends will transport Gwenette GreetBrenda S Araminta Zorn RN MSN CCM Care Management [phone]

## 2015-12-13 NOTE — Progress Notes (Signed)
Rehab Admissions Coordinator Note:  Patient was screened by Clois DupesBoyette, Leng Montesdeoca Godwin for appropriateness for an Inpatient Acute Rehab Consult per PT   Recommendation. At this time, we are recommending inpt acute rehab assessment for possible [facility].. I will contact Dr. Luberta MutterKonidena for order and dscussion.  Clois DupesBoyette, Jordon Bourquin Godwin 12/13/2015, 3:54 PM  I can be reached at [phone].

## 2015-12-13 NOTE — Progress Notes (Signed)
Anticoag Monitoring Patient is currently ordered Heparin 5000 units SQ q8h for DVT prevention.  12/27 platelet count of 147  Per policy, CBC is required at least every 3 days for patients on prophylactic dose heparin. Will order CBC for 12/30 am labs.  Clovia CuffLisa Carrick Rijos, PharmD, BCPS 12/13/2015 4:32 PM

## 2015-12-13 NOTE — Evaluation (Addendum)
Physical Therapy Evaluation Patient Details Name: Dorothy Long MRN: [id number] DOB: 12/27/1960 Today's Date: 12/13/2015   History of Present Illness  presented to ER after being found on floor by husband, + LOC, noted with impaired speech and R-sided weakness upon awakening; admitted for acute CVA.  MRI significant for multifocal infarcts throughout L hemisphere.  Of note, patient with elevated troponin (peak 4.79), likely demand ischemia due to acute CVA per cardiology; no invasive work-up recommneded at this time.  Clinical Impression  Upon evaluation, patient alert and oriented to self, location and general date (when provided choices); significant expressive > receptive aphasia noted.  Patient easily frustrated with communication deficits.  Demonstrates significant weakness of R hemi-body (strength 2+ to 3-/5) with moderate sensory/proprioceptive deficits (UE > LE); suspected inattention to R hemi-body.  Currently requiring mod assist for bed mobility; sit/stand, basic transfers and gait (5') with bilat HHA, mod/max assist +1-2.  Extensive facilitation from therapist for L anterior/lateral weight shift and R hip/knee control with all mobility efforts.  Patient very motivated and demonstrates excellent effort/participation with all therapeutic interventions. Would benefit from skilled PT to address above deficits and promote optimal return to PLOF; recommend transition to acute inpatient rehab upon discharge for high-intensity, post-acute rehab services.      Follow Up Recommendations CIR    Equipment Recommendations       Recommendations for Other Services       Precautions / Restrictions Precautions Precautions: Fall Precaution Comments: Seizure, L AVF (No BP L UE), Tenkoff catheter (PD) Restrictions Weight Bearing Restrictions: No      Mobility  Bed Mobility Overal bed mobility: Needs Assistance Bed Mobility: Supine to Sit     Supine to sit: Mod assist     General  bed mobility comments: for truncal elevation with transition towards R  Transfers Overall transfer level: Needs assistance Equipment used: 1 person hand held assist Transfers: Sit to/from Stand Sit to Stand: Mod assist         General transfer comment: verbal cuing for R TKE in closed-chain position (requires cuing to initiate)  Ambulation/Gait Ambulation/Gait assistance: Mod assist Ambulation Distance (Feet): 5 Feet Assistive device: 2 person hand held assist       General Gait Details: step to gait pattern, mod assist +1 for L anterior/lateral weight shift and R hip/knee control in loading phases of gait cycle.  Poor dynamic balance; high fall risk.  Stairs            Wheelchair Mobility    Modified Rankin (Stroke Patients Only)       Balance Overall balance assessment: Needs assistance Sitting-balance support: No upper extremity supported;Feet supported Sitting balance-Leahy Scale: Fair     Standing balance support: Single extremity supported Standing balance-Leahy Scale: Poor                               Pertinent Vitals/Pain Pain Assessment: No/denies pain    Home Living Family/patient expects to be discharged to:: Private residence Living Arrangements: Spouse/significant other Available Help at Discharge: Family Type of Home: House Home Access: Stairs to enter Entrance Stairs-Rails: None Entrance Stairs-Number of Steps: 4 Home Layout: One level        Prior Function Level of Independence: Independent         Comments: Indep with baseline mobility; has been utilizing [facility] since recent TIA (early Dec 2016) affecting L hemi-body     Hand Dominance  Extremity/Trunk Assessment   Upper Extremity Assessment:  (R LE grossly 2+/5 proximally, 3-/5 distally.  Able to detect light touch, but suspect mod propriceptive deficit)       LUE Deficits / Details:  (R LE grossly 3-/5 throughout with decreased coordination, speed of  activationl; negative clonus, babinski; sensation grossly intact.  L LE grossly at least 4/5)   Lower Extremity Assessment:  (Comment: R LE grossly 3-/5 throughout with decreased coordination, speed of activationl; negative clonus, babinski; sensation grossly intact.  L LE grossly at least 4/5)      Cervical / Trunk Assessment:  (mild elongation of R lateral trunk noted)  Communication   Communication: Expressive difficulties (intermittent expressive abilities, optimized when provided choices)  Cognition Arousal/Alertness: Awake/alert Behavior During Therapy: WFL for tasks assessed/performed Overall Cognitive Status: Within Functional Limits for tasks assessed                      General Comments      Exercises Other Exercises Other Exercises: Unsupported sitting balance, close sup--verbal cuing for midline orientation Other Exercises: Sit/stand x3 with HHA, mod assist +1--emphasis on postural extension and R hip/knee extension in closed/chain position; progressed to include L LE forward/backward stepping with mod assist to control R hip/knee position (prevent recurvatum)      Assessment/Plan    PT Assessment Patient needs continued PT services  PT Diagnosis Difficulty walking;Abnormality of gait;Generalized weakness;Hemiplegia dominant side   PT Problem List Decreased strength;Decreased range of motion;Decreased activity tolerance;Decreased balance;Decreased mobility;Decreased coordination;Decreased cognition;Decreased knowledge of use of DME;Decreased safety awareness;Decreased knowledge of precautions;Impaired sensation  PT Treatment Interventions DME instruction;Gait training;Stair training;Functional mobility training;Therapeutic activities;Therapeutic exercise;Balance training;Neuromuscular re-education;Cognitive remediation;Patient/family education   PT Goals (Current goals can be found in the Care Plan section)      Frequency 7X/week   Barriers to discharge         Co-evaluation               End of Session Equipment Utilized During Treatment: Gait belt Activity Tolerance: Patient tolerated treatment well Patient left: in chair;with call bell/phone within reach;with chair alarm set;with family/visitor present Nurse Communication: Mobility status         Time:1406  - 1440     Charges:  Eval (1 unit)                         Neuro Re-ed (8-22 min)         PT G Codes:        Dorothy Long, PT, DPT, NCS 12/13/2015, 3:12 PM [phone]  Addendum: Charges and time corrected/added to note. Dorothy Long, PT, DPT, NCS 12/14/2015, 1:12 PM [phone]

## 2015-12-13 NOTE — Care Management (Signed)
Patient ruled in for cva.  PT order had been "completed" so order was not current.  Obtained another order from attending.  If patient requires skilled nursing placement, this could have impact on peritoneal dialysis status.

## 2015-12-13 NOTE — Progress Notes (Signed)
Initial Nutrition Assessment   INTERVENTION:   Meals and Snacks: Cater to patient preferences. Pt may benefit from liberalizing diet order to dysphagia III only per previous admissions and pt not eating well on renal diet order. SLP following. RD notes order for decaf coffee on am trays. Medical Food Supplement Therapy: Will recommend Ensure Enlive po daily, each supplement provides 350 kcal and 20 grams of protein and will monitor electrolytes as pt on previous admissions reports drinking Boost at home.   NUTRITION DIAGNOSIS:   Inadequate oral intake related to acute illness as evidenced by meal completion < 25%.  GOAL:   Patient will meet greater than or equal to 90% of their needs  MONITOR:    (Energy Intake, Electrolyte and renal Profile, Anthropometrics, Digestive system)  REASON FOR ASSESSMENT:    (Renal Diet Order)    ASSESSMENT:   Pt admitted with AMS secondary to acute CVA s/p MRI per MD note. Pt with h/o ESRD on PD; Nephrology following.  Past Medical History  Diagnosis Date  . Hypertension   . Hyperlipidemia   . COPD (chronic obstructive pulmonary disease) (HCC)     a. not on home O2  . Anemia   . Valvular disease     a. 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 echo 2015: EF 45-50%, borderline LVH, mod dilated LA, mildly dilated RA, small pericardial effusion, mod MR (rheumatic deformity), MS mild-mod, no gradient, PASP ele  . Chronic combined systolic and diastolic CHF (congestive heart failure) (HCC)     a. echo reports above  . PAF (paroxysmal atrial fibrillation) (HCC)     a. in setting of diarrhea 09/21/2013; b. not on long term full dose anticoagulation; c. CHADSVASC at least 4 (CHF, HTN, DM, female)  . Diabetes mellitus (HCC)   . Peritoneal dialysis status (HCC) 4- 15-15  .  Ventral hernia   . Inguinal hernia   . History of stress test     a. 2014: no convincing evidence of pharmacologically induced ischemia,  apparent ant apical defect present only on attenuation corrected images favored to reflect artifact due to overcorrection & may be worse on stress imaging secondary to greater patient motion. A small area of ischemia is felt less likely but is difficult to entirely exclude, EF 55%  . ESRD (end stage renal disease) on dialysis (HCC)     a. HD on T,T,S; b. 2/2 focal segmental glomerulosclerosis   . Dialysis patient (HCC)   . Renal insufficiency      Diet Order:  DIET DYS 3 Room service appropriate?: Yes; Fluid consistency:: Thin; Fluid restriction:: 1200 mL Fluid    Current Nutrition: Pt sleepy this am, only nodded that she ate breakfast this am on visit. Per documentation pt ate bites of meals since admission.   Food/Nutrition-Related History: Per MST pt with decreased appetite PTA.   Scheduled Medications:  . aspirin EC  81 mg Oral Daily  . atorvastatin  20 mg Oral Daily  . calcium acetate  667 mg Oral TID  . clopidogrel  75 mg Oral Daily  . dialysis solution 1.5% low-MG/low-CA   Intraperitoneal Q24H  . gentamicin cream  1 application Topical Daily  . heparin  5,000 Units Subcutaneous 3 times per day  . sevelamer carbonate  1,600 mg Oral TID WC  . tiotropium  18 mcg Inhalation Daily     Electrolyte/Renal Profile and Glucose Profile:   Recent Labs Lab 12/10/15 2343 12/11/15 0452 12/12/15 0340  NA 136 136 135  K 3.2* 3.5 3.5  CL 97* 100* 99*  CO2 22 22 21*  BUN 63* 65* 62*  CREATININE 9.58* 9.71* 9.59*  CALCIUM 7.7* 7.8* 8.2*  GLUCOSE 128* 107* 93   Protein Profile:  Recent Labs Lab 12/10/15 2343  ALBUMIN 3.0*    Gastrointestinal Profile: Last BM:  12/12/2015   Nutrition-Focused Physical Exam Findings:  Unable to complete Nutrition-Focused physical exam at this time. RD notes in July RD documented mild-severe muscle  depletion at that time.   Weight Change: RD notes stated weight of 108lbs on admission. Pt current weight of 109lbs actual.  Per [facility] encounters pt with 11% weight loss in 4 months.    Height:   Ht Readings from Last 1 Encounters:  12/10/15 5\' 4"  (1.626 m)    Weight:   Wt Readings from Last 1 Encounters:  12/12/15 109 lb 11.2 oz (49.76 kg)    Wt Readings from Last 10 Encounters:  12/12/15 109 lb 11.2 oz (49.76 kg)  11/22/15 117 lb (53.071 kg)  11/20/15 111 lb 11.2 oz (50.667 kg)  11/12/15 111 lb 12 oz (50.689 kg)  10/12/15 117 lb 9.6 oz (53.343 kg)  08/22/15 118 lb 9.6 oz (53.797 kg)  08/08/15 121 lb (54.885 kg)  07/24/15 126 lb (57.153 kg)  07/18/15 122 lb 12.8 oz (55.702 kg)  07/13/15 118 lb (53.524 kg)    BMI:  Body mass index is 18.82 kg/(m^2).  Estimated Nutritional Needs:   Kcal:  BEE: 1093kcals, TEE: (IF 1.2-1.4)(AF 1.3) 1705-1990kcals   Protein:  60-75g protein (1.2-1.5g/kg)   Fluid:  UOP+2L  EDUCATION NEEDS:   No education needs identified at this time   HIGH Care Level  Leda QuailAllyson Junice Fei, RD, LDN Pager [phone] Weekend/On-Call Pager [phone]

## 2015-12-13 NOTE — Plan of Care (Signed)
Problem: Safety: Goal: Ability to remain free from injury will improve Outcome: Progressing Pt remains free of injury during the shift.  Problem: Pain Managment: Goal: General experience of comfort will improve Outcome: Progressing Pt denies pain.  Problem: Physical Regulation: Goal: Ability to maintain clinical measurements within normal limits will improve Outcome: Progressing VSS. NIH score 7. No neuro changes during the shift. Pt had Echo today.  Problem: Tissue Perfusion: Goal: Risk factors for ineffective tissue perfusion will decrease Outcome: Progressing Able to turn self. Pt refuses SCD's. Heparin continues.  Problem: Activity: Goal: Risk for activity intolerance will decrease Outcome: Progressing Pt was up to the chair today with 1xassit.  Problem: Fluid Volume: Goal: Ability to maintain a balanced intake and output will improve Outcome: Progressing Fluids restrictions continues. Poor PO intake fluids and food.  Problem: Nutrition: Goal: Adequate nutrition will be maintained Outcome: Progressing Poor appetite. Pt encouraged to eat. Now on regular diet.

## 2015-12-13 NOTE — Progress Notes (Signed)
[facility] - [place] [facility]   PATIENT NAME: Dorothy Long    MR#:  [id number]  DATE OF BIRTH:  06-12-1961  SUBJECTIVE: She is admitted for altered mental status. Right-sided weakness found to have multiple infarcts on the left side.    .   CHIEF COMPLAINT:   Chief Complaint  Patient presents with  . Altered Mental Status   - Patient admitted secondary to altered mental status. Aphasic now, also has right-sided weakness. -At baseline, patient is very communicative, alert and ambulates with a walker  REVIEW OF SYSTEMS:  Review of Systems  Constitutional: Negative for fever and chills.  HENT: Negative for hearing loss.   Eyes: Negative for blurred vision, double vision and photophobia.  Respiratory: Negative for cough, hemoptysis and shortness of breath.   Cardiovascular: Negative for palpitations, orthopnea and leg swelling.  Gastrointestinal: Negative for vomiting, abdominal pain and diarrhea.  Genitourinary: Negative for dysuria and urgency.  Musculoskeletal: Negative for myalgias and neck pain.  Skin: Negative for rash.  Neurological: Positive for speech change and focal weakness. Negative for dizziness, seizures, weakness and headaches.       Right-sided hemiplegia, slurred speech noted.  Psychiatric/Behavioral: Negative for memory loss. The patient does not have insomnia.   Dysarthria  DRUG ALLERGIES:   Allergies  Allergen Reactions  . Ciprocinonide [Fluocinolone] Hives  . Demerol [Meperidine] Nausea And Vomiting  . Diflucan [Fluconazole] Nausea Only  . Flagyl [Metronidazole] Hives and Itching  . Hydrocodone Itching  . Levaquin [Levofloxacin In D5w] Other (See Comments)    Numbness in legs  . Morphine And Related Nausea And Vomiting  . Tetracyclines & Related Itching and Swelling  . Valium [Diazepam] Nausea Only    hallucinations    VITALS:  Blood pressure 121/88, pulse 66, temperature 97.6 F (36.4 C), temperature source  Oral, resp. rate 18, height  (1.626 m), weight 49.76 kg (109 lb 11.2 oz), SpO2 100 %.  PHYSICAL EXAMINATION:  Physical Exam  Constitutional: She is oriented to person, place, and time and well-developed, well-nourished, and in no distress.  HENT:  Head: Normocephalic.  Right Ear: External ear normal.  Mouth/Throat: Oropharynx is clear and moist.  Eyes: Conjunctivae and EOM are normal. Pupils are equal, round, and reactive to light.  Neck: Neck supple. No tracheal deviation present. No thyromegaly present.  Cardiovascular: Normal rate, regular rhythm and normal heart sounds.  Exam reveals no gallop.   No murmur heard. Pulmonary/Chest: She has no wheezes. She has no rales.  Abdominal: She exhibits no distension. There is no tenderness. There is no rebound.  Musculoskeletal: Normal range of motion.  Lymphadenopathy:    She has no cervical adenopathy.  Neurological: She is alert and oriented to person, place, and time. Coordination normal.  Overall the right-sided weakness on the upper and lower extremities. It is 2/5.  Skin: Skin is dry.  Psychiatric: Memory and affect normal.      LABORATORY PANEL:   CBC  Recent Labs Lab 12/11/15 0452  WBC 6.0  HGB 8.4*  HCT 25.8*  PLT 147*   ------------------------------------------------------------------------------------------------------------------  Chemistries   Recent Labs Lab 12/10/15 2343  12/12/15 0340  NA 136  < > 135  K 3.2*  < > 3.5  CL 97*  < > 99*  CO2 22  < > 21*  GLUCOSE 128*  < > 93  BUN 63*  < > 62*  CREATININE 9.58*  < > 9.59*  CALCIUM 7.7*  < > 8.2*  AST 23  --   --   ALT 14  --   --   ALKPHOS 104  --   --   BILITOT 0.9  --   --   < > = values in this interval not displayed. ------------------------------------------------------------------------------------------------------------------  Cardiac Enzymes  Recent Labs Lab 12/11/15 1502  TROPONINI 4.79*    ------------------------------------------------------------------------------------------------------------------  RADIOLOGY:  Ct Angio Neck W/cm &/or Wo/cm  12/11/2015  CLINICAL DATA:  Multiple acute LEFT hemisphere infarcts. Abnormal appearance of the LEFT internal carotid artery on MRI and MRA. Right-sided weakness with aphasia beginning yesterday. EXAM: CT ANGIOGRAPHY NECK TECHNIQUE: Multidetector CT imaging of the neck was performed using the standard protocol during bolus administration of intravenous contrast. Multiplanar CT image reconstructions and MIPs were obtained to evaluate the vascular anatomy. Carotid stenosis measurements (when applicable) are obtained utilizing NASCET criteria, using the distal internal carotid diameter as the denominator. CONTRAST:  OMNIPAQUE IOHEXOL 350 MG/ML SOLN COMPARISON:  MRI and MRA earlier today.  CT head 12/10/2015. FINDINGS: Aortic arch: Standard branching. Imaged portion shows no evidence of aneurysm or dissection. No significant stenosis of the major arch vessel origins. Right carotid system: Minor calcific disease in the common carotid artery and bifurcation. Widely patent RIGHT ICA. No evidence of dissection, stenosis (50% or greater) or occlusion. Left carotid system: Moderate calcific and soft plaque is present in the bulb. In the cervical ICA at approximately the C2 level, the vessel caliber becomes diffusely small throughout the cervical segment. No skull base pseudoaneurysm. No intraluminal flap. The horizontal and vertical petrous segments of the LEFT ICA are patent but small but patent. No evidence of dissection, stenosis (50% or greater) or occlusion. Vertebral arteries: Codominant. No evidence of dissection, stenosis (50% or greater) or occlusion. Skeleton: Negative Other neck: No masses. No lung apex lesion. Mild apical pleural thickening. IMPRESSION: Normal RIGHT anterior circulation. Non stenotic calcific and soft plaque at the LEFT ICA  origin, but no flow-limiting stenosis at that level nor is there strong evidence for LEFT ICA dissection. The cervical LEFT ICA becomes diffusely small but is patent at the C2 level extending Cephalad to the skullbase. This is favored to represent luminal narrowing due to severe intracranial stenosis, involving the LICA cavernous or supraclinoid level, or both. Electronically Signed   By: Elsie Stain M.D.   On: 12/11/2015 17:28   Mr Brain Wo Contrast  12/11/2015  CLINICAL DATA:  Patient with atrial fibrillation not on long-term anticoagulation, also end-stage renal disease, hypertension, and diabetes, presents with disorder of speech 12/10/2015. Also Syncopal episode. EXAM: MRI HEAD WITHOUT CONTRAST MRA HEAD WITHOUT CONTRAST TECHNIQUE: Multiplanar, multiecho pulse sequences of the brain and surrounding structures were obtained without intravenous contrast. Angiographic images of the head were obtained using MRA technique without contrast. COMPARISON:  MR head 11/17/2015.  CT head 12/10/2015. FINDINGS: MRI HEAD FINDINGS Multifocal areas of acute infarction affect the LEFT hemisphere, including LEFT frontal, temporal, parietal, and occipital lobes, LEFT caudate nucleus and lentiform nucleus, and regional subcortical greater than periventricular white matter. No posterior fossa involvement. No RIGHT hemisphere involvement. These could be related to multiple emboli from the diseased LEFT ICA described below, or watershed type phenomenon from hypoperfusion. Normal for age cerebral volume. Paucity of white matter disease given patient's comorbidities. Remote RIGHT posterior frontal and subcortical infarction. No significant chronic hemorrhage. No midline abnormality. Extracranial soft tissues unremarkable. No osseous findings MRA HEAD FINDINGS The RIGHT internal carotid artery is widely patent. The basilar artery is widely patent with  RIGHT vertebral dominant. There is a 50% stenosis of the non dominant LEFT  vertebral at its junction with basilar. The RIGHT M1 MCA is widely patent. Minor non stenotic narrowing of the proximal RIGHT A1 ACA. The anterior communicating artery poorly seen. There is apparent cross fill right-to-left supplying the anterior circulation. There is some flow related enhancement of the LEFT ICA in its cavernous and supraclinoid segment, but there is diminished to absent petrous LEFT ICA flow. Tapering flow related enhancement is observed in a small but patent LEFT cervical ICA. LEFT M1 MCA small but patent. Severe stenosis proximal A2 LEFT ACA. Severely diminished flow related enhancement in the LEFT M2 and M3 vessels. No PCA stenosis or occlusion. No cerebellar branch occlusion. No intracranial aneurysm. Compared with the prior MRI from 11/17/2015, the infarctions are new, and the LEFT ICA appears to be further diminished caliber. IMPRESSION: Multifocal areas of acute infarction affecting the LEFT hemisphere without visible hemorrhage. It is unclear if these represent multiple emboli or watershed type phenomenon. No posterior fossa or RIGHT hemisphere involvement. Segmental areas of diminished or absent flow related enhancement in the LEFT ICA. String sign is suspected. Formal catheter angiogram recommended for further evaluation to potentially re-establish flow in a diseased vessel. Critical Value/emergent results were called by telephone at the time of interpretation on 12/11/2015 at 1:49 pm to Dr. Nemiah Commander , who verbally acknowledged these results. Electronically Signed   By: Elsie Stain M.D.   On: 12/11/2015 13:51   Mr Maxine Glenn Head/brain Wo Cm  12/11/2015  CLINICAL DATA:  Patient with atrial fibrillation not on long-term anticoagulation, also end-stage renal disease, hypertension, and diabetes, presents with disorder of speech 12/10/2015. Also Syncopal episode. EXAM: MRI HEAD WITHOUT CONTRAST MRA HEAD WITHOUT CONTRAST TECHNIQUE: Multiplanar, multiecho pulse sequences of the brain and  surrounding structures were obtained without intravenous contrast. Angiographic images of the head were obtained using MRA technique without contrast. COMPARISON:  MR head 11/17/2015.  CT head 12/10/2015. FINDINGS: MRI HEAD FINDINGS Multifocal areas of acute infarction affect the LEFT hemisphere, including LEFT frontal, temporal, parietal, and occipital lobes, LEFT caudate nucleus and lentiform nucleus, and regional subcortical greater than periventricular white matter. No posterior fossa involvement. No RIGHT hemisphere involvement. These could be related to multiple emboli from the diseased LEFT ICA described below, or watershed type phenomenon from hypoperfusion. Normal for age cerebral volume. Paucity of white matter disease given patient's comorbidities. Remote RIGHT posterior frontal and subcortical infarction. No significant chronic hemorrhage. No midline abnormality. Extracranial soft tissues unremarkable. No osseous findings MRA HEAD FINDINGS The RIGHT internal carotid artery is widely patent. The basilar artery is widely patent with RIGHT vertebral dominant. There is a 50% stenosis of the non dominant LEFT vertebral at its junction with basilar. The RIGHT M1 MCA is widely patent. Minor non stenotic narrowing of the proximal RIGHT A1 ACA. The anterior communicating artery poorly seen. There is apparent cross fill right-to-left supplying the anterior circulation. There is some flow related enhancement of the LEFT ICA in its cavernous and supraclinoid segment, but there is diminished to absent petrous LEFT ICA flow. Tapering flow related enhancement is observed in a small but patent LEFT cervical ICA. LEFT M1 MCA small but patent. Severe stenosis proximal A2 LEFT ACA. Severely diminished flow related enhancement in the LEFT M2 and M3 vessels. No PCA stenosis or occlusion. No cerebellar branch occlusion. No intracranial aneurysm. Compared with the prior MRI from 11/17/2015, the infarctions are new, and the  LEFT ICA appears to be  further diminished caliber. IMPRESSION: Multifocal areas of acute infarction affecting the LEFT hemisphere without visible hemorrhage. It is unclear if these represent multiple emboli or watershed type phenomenon. No posterior fossa or RIGHT hemisphere involvement. Segmental areas of diminished or absent flow related enhancement in the LEFT ICA. String sign is suspected. Formal catheter angiogram recommended for further evaluation to potentially re-establish flow in a diseased vessel. Critical Value/emergent results were called by telephone at the time of interpretation on 12/11/2015 at 1:49 pm to Dr. Nemiah CommanderKalisetti , who verbally acknowledged these results. Electronically Signed   By: Elsie StainJohn T Curnes M.D.   On: 12/11/2015 13:51    EKG:   Orders placed or performed during the hospital encounter of 12/10/15  . ED EKG  . ED EKG  . EKG 12-Lead  . EKG 12-Lead  . EKG 12-Lead  . EKG 12-Lead    ASSESSMENT AND PLAN:   54 year old female with multiple medical problems including COPD, end-stage renal disease on peritoneal dialysis, hypertension, hyperlipidemia, congestive heart failure with systolic dysfunction, chronic anemia, history of atrial fibrillation, diabetes mellitus presents to the hospital secondary to change in mental status, speech and RIGHT-sided weakness.  #1 multifocal stroke: MRI of the brain showed a left MCA, PCA, MCA territories. Patient had a CT angiogram of the neck did not show any significant stenosis that needs to be operated. Continue aspirin, Plavix. Statins. Physical therapy consult speech therapy consult. . No lymphadenopathy. Patient needs outpatient 30 day loop moniotr evaluation of atrial fibrillation. Continue dysphagia 3 diet.  appreciate Vascular, and neurology evaluations.   -Most recent echo 3 weeks ago with no clot. - #2 elevated troponin-possibly demand ischemia. Patient denies any chest pain. -Has stroke and also an end-stage renal dialysis  patient. -Continue to monitor troponin.  Cardiology is  following patient needs outpatient TEE. #3 history of paroxysmal atrial fibrillation with 17 beats of nonsustained V. tach on telemetry. -Appreciate cardiology consult. -Metoprolol discontinued for now due to acute stroke and permissive hypertension requested Patient needs outpatient TEE. #4  end-stage renal disease on peritoneal dialysis-also has hemodialysis catheter. Prior history of peritonitis and abdominal hernias. -Nephrology consulted.  #5 anemia of chronic disease-stable. -With acute stroke, likely not a candidate for Procrit  #6 hypokalemia-per nephrology.  #7 tobacco use disorder-smoking cessation advised  # 8 DVT prophylaxis-on subcutaneous heparin Downgraded to regular stroke unit.  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  All the records are reviewed and case discussed with Care Management/Social Workerr. Management plans discussed with the patient, family and they are in agreement.  CODE STATUS: Full Code  TOTAL TIME SPENT IN TAKING CARE OF THIS PATIENT: 45 minutes.   POSSIBLE D/C IN 2-3 DAYS, DEPENDING ON CLINICAL CONDITION.   Katha HammingKONIDENA,Claris Pech M.D on 12/13/2015 at 11:37 AM  Between 7am to 6pm - Pager - [phone]  After 6pm go to [email or web address] - password [email or web address]  [facility]  Office  [phone]  CC: Primary care physician; Sherlene ShamsULLO, TERESA L, MD

## 2015-12-13 NOTE — Progress Notes (Signed)
Patient: Dorothy Long / Admit Date: 12/10/2015 / Date of Encounter: 12/13/2015, 8:55 AM   Subjective: Continued improved speech and altertness.    Review of Systems: Review of Systems  Constitutional: Positive for weight loss and malaise/fatigue. Negative for fever, chills and diaphoresis.  HENT: Negative for congestion.   Eyes: Negative for discharge and redness.  Respiratory: Negative for cough, hemoptysis, sputum production, shortness of breath and wheezing.   Cardiovascular: Negative for chest pain, palpitations, orthopnea, claudication, leg swelling and PND.  Gastrointestinal: Negative for heartburn, nausea, vomiting and abdominal pain.  Musculoskeletal: Negative for myalgias, back pain, joint pain, falls and neck pain.  Skin: Negative for rash.  Neurological: Positive for speech change, focal weakness and weakness. Negative for dizziness, tingling, tremors, sensory change, seizures and loss of consciousness.       Right-sided  Endo/Heme/Allergies: Does not bruise/bleed easily.  Psychiatric/Behavioral: The patient is not nervous/anxious.     Objective: Telemetry: NSR, 80's, 5 beats of NSVT Physical Exam: Blood pressure 121/88, pulse 66, temperature 97.6 F (36.4 C), temperature source Oral, resp. rate 18, height  (1.626 m), weight 109 lb 11.2 oz (49.76 kg), SpO2 100 %. Body mass index is 18.82 kg/(m^2). General: Well developed, well nourished, in no acute distress. Head: Normocephalic, atraumatic, sclera non-icteric, no xanthomas, nares are without discharge. Neck: Negative for carotid bruits. JVP not elevated. Lungs: Clear bilaterally to auscultation without wheezes, rales, or rhonchi. Breathing is unlabored. Heart: RRR S1 S2 without murmurs, rubs, or gallops.  Abdomen: Soft, non-tender, non-distended with normoactive bowel sounds. No rebound/guarding. Extremities: No clubbing or cyanosis. No edema. Decreased ROM and strength right side.  Neuro: Alert and  oriented X 3.  Psych:  Responds to questions appropriately with a normal affect.   Intake/Output Summary (Last 24 hours) at 12/13/15 0855 Last data filed at 12/12/15 1818  Gross per 24 hour  Intake      0 ml  Output     60 ml  Net    -60 ml    Inpatient Medications:  . aspirin EC  81 mg Oral Daily  . atorvastatin  20 mg Oral Daily  . calcium acetate  667 mg Oral TID  . clopidogrel  75 mg Oral Daily  . dialysis solution 1.5% low-MG/low-CA   Intraperitoneal Q24H  . gentamicin cream  1 application Topical Daily  . heparin  5,000 Units Subcutaneous 3 times per day  . sevelamer carbonate  1,600 mg Oral TID WC  . tiotropium  18 mcg Inhalation Daily   Infusions:    Labs:  Recent Labs  12/11/15 0452 12/12/15 0340  NA 136 135  K 3.5 3.5  CL 100* 99*  CO2 22 21*  GLUCOSE 107* 93  BUN 65* 62*  CREATININE 9.71* 9.59*  CALCIUM 7.8* 8.2*    Recent Labs  12/10/15 2343  AST 23  ALT 14  ALKPHOS 104  BILITOT 0.9  PROT 6.2*  ALBUMIN 3.0*    Recent Labs  12/10/15 2343 12/11/15 0452  WBC 6.3 6.0  NEUTROABS  --  4.9  HGB 9.2* 8.4*  HCT 29.1* 25.8*  MCV 88.2 86.2  PLT 150 147*    Recent Labs  12/10/15 2343 12/11/15 0045 12/11/15 0854 12/11/15 1502  TROPONINI 0.25* 0.64* 3.18* 4.79*   Invalid input(s): POCBNP No results for input(s): HGBA1C in the last 72 hours.   Weights: Filed Weights   12/11/15 0327 12/12/15 0400 12/12/15 1317  Weight: 112 lb 12.8 oz (51.166 kg) 108  lb 0.4 oz (49 kg) 109 lb 11.2 oz (49.76 kg)     Radiology/Studies:  Dg Chest 2 View  11/17/2015  CLINICAL DATA:  Chest pain EXAM: CHEST  2 VIEW COMPARISON:  08/21/2015 FINDINGS: Chronic cardiopericardial enlargement. There is no edema, consolidation, effusion, or pneumothorax. Small pneumoperitoneum, presumably from patient's peritoneal dialysis. There is no indication of abdominal pain. IMPRESSION: 1. No evidence of acute cardiopulmonary disease. 2. Pneumoperitoneum, presumably from  patient's peritoneal dialysis access given no history of abdominal pain. 3. Chronic cardiomegaly. Electronically Signed   By: Marnee Spring M.D.   On: 11/17/2015 12:11   Ct Head Wo Contrast  12/11/2015  CLINICAL DATA:  Larey Seat in bathroom. Slurred speech and altered mental status. History of hypertension, hyperlipidemia, and diabetes, end-stage renal disease on dialysis. EXAM: CT HEAD WITHOUT CONTRAST TECHNIQUE: Contiguous axial images were obtained from the base of the skull through the vertex without intravenous contrast. COMPARISON:  MRI of the brain November 17, 2015 FINDINGS: The ventricles and sulci are normal. No intraparenchymal hemorrhage, mass effect nor midline shift. No acute large vascular territory infarcts. Small area RIGHT frontal encephalomalacia. No abnormal extra-axial fluid collections. Basal cisterns are patent. A few tiny extra-axial punctate calcifications. No skull fracture. The included ocular globes and orbital contents are non-suspicious. The mastoid aircells and included paranasal sinuses are well-aerated. IMPRESSION: No acute intracranial process. Focal RIGHT frontal encephalomalacia may be posttraumatic or ischemic. Punctate extra-axial calcifications could be vascular. Electronically Signed   By: Awilda Metro M.D.   On: 12/11/2015 00:09   Ct Head Wo Contrast  11/17/2015  CLINICAL DATA:  Syncopal episode cooking in the kitchen. EXAM: CT HEAD WITHOUT CONTRAST TECHNIQUE: Contiguous axial images were obtained from the base of the skull through the vertex without intravenous contrast. COMPARISON:  None available for review. FINDINGS: Skull and Sinuses:Negative for fracture or destructive process. The visualized mastoids, middle ears, and imaged paranasal sinuses are clear. Few gas bubbles along the right vertebral artery and in the bilateral cavernous sinus region consistent with intravenous gas from IV access. Orbits: No acute abnormality. Brain: No evidence of acute infarction,  hemorrhage, hydrocephalus, or mass lesion/mass effect. Volume averaging versus small cortical and subcortical gliosis in the high right frontal lobe, lateral compatible with a remote small vessel infarct. There are bilateral sulcal calcifications which are likely sequela of infection or inflammation. Atherosclerotic calcifications is much less likely given size and location. These results were called by telephone at the time of interpretation on 11/17/2015 at 12:32 pm to Dr. Jene Every , who verbally acknowledged these results. IMPRESSION: No acute finding. Electronically Signed   By: Marnee Spring M.D.   On: 11/17/2015 12:35   Ct Angio Neck W/cm &/or Wo/cm  12/11/2015  CLINICAL DATA:  Multiple acute LEFT hemisphere infarcts. Abnormal appearance of the LEFT internal carotid artery on MRI and MRA. Right-sided weakness with aphasia beginning yesterday. EXAM: CT ANGIOGRAPHY NECK TECHNIQUE: Multidetector CT imaging of the neck was performed using the standard protocol during bolus administration of intravenous contrast. Multiplanar CT image reconstructions and MIPs were obtained to evaluate the vascular anatomy. Carotid stenosis measurements (when applicable) are obtained utilizing NASCET criteria, using the distal internal carotid diameter as the denominator. CONTRAST:  OMNIPAQUE IOHEXOL 350 MG/ML SOLN COMPARISON:  MRI and MRA earlier today.  CT head 12/10/2015. FINDINGS: Aortic arch: Standard branching. Imaged portion shows no evidence of aneurysm or dissection. No significant stenosis of the major arch vessel origins. Right carotid system: Minor calcific disease in the  common carotid artery and bifurcation. Widely patent RIGHT ICA. No evidence of dissection, stenosis (50% or greater) or occlusion. Left carotid system: Moderate calcific and soft plaque is present in the bulb. In the cervical ICA at approximately the C2 level, the vessel caliber becomes diffusely small throughout the cervical segment. No  skull base pseudoaneurysm. No intraluminal flap. The horizontal and vertical petrous segments of the LEFT ICA are patent but small but patent. No evidence of dissection, stenosis (50% or greater) or occlusion. Vertebral arteries: Codominant. No evidence of dissection, stenosis (50% or greater) or occlusion. Skeleton: Negative Other neck: No masses. No lung apex lesion. Mild apical pleural thickening. IMPRESSION: Normal RIGHT anterior circulation. Non stenotic calcific and soft plaque at the LEFT ICA origin, but no flow-limiting stenosis at that level nor is there strong evidence for LEFT ICA dissection. The cervical LEFT ICA becomes diffusely small but is patent at the C2 level extending Cephalad to the skullbase. This is favored to represent luminal narrowing due to severe intracranial stenosis, involving the LICA cavernous or supraclinoid level, or both. Electronically Signed   By: Elsie Stain M.D.   On: 12/11/2015 17:28   Mr Brain Wo Contrast  12/11/2015  CLINICAL DATA:  Patient with atrial fibrillation not on long-term anticoagulation, also end-stage renal disease, hypertension, and diabetes, presents with disorder of speech 12/10/2015. Also Syncopal episode. EXAM: MRI HEAD WITHOUT CONTRAST MRA HEAD WITHOUT CONTRAST TECHNIQUE: Multiplanar, multiecho pulse sequences of the brain and surrounding structures were obtained without intravenous contrast. Angiographic images of the head were obtained using MRA technique without contrast. COMPARISON:  MR head 11/17/2015.  CT head 12/10/2015. FINDINGS: MRI HEAD FINDINGS Multifocal areas of acute infarction affect the LEFT hemisphere, including LEFT frontal, temporal, parietal, and occipital lobes, LEFT caudate nucleus and lentiform nucleus, and regional subcortical greater than periventricular white matter. No posterior fossa involvement. No RIGHT hemisphere involvement. These could be related to multiple emboli from the diseased LEFT ICA described below, or  watershed type phenomenon from hypoperfusion. Normal for age cerebral volume. Paucity of white matter disease given patient's comorbidities. Remote RIGHT posterior frontal and subcortical infarction. No significant chronic hemorrhage. No midline abnormality. Extracranial soft tissues unremarkable. No osseous findings MRA HEAD FINDINGS The RIGHT internal carotid artery is widely patent. The basilar artery is widely patent with RIGHT vertebral dominant. There is a 50% stenosis of the non dominant LEFT vertebral at its junction with basilar. The RIGHT M1 MCA is widely patent. Minor non stenotic narrowing of the proximal RIGHT A1 ACA. The anterior communicating artery poorly seen. There is apparent cross fill right-to-left supplying the anterior circulation. There is some flow related enhancement of the LEFT ICA in its cavernous and supraclinoid segment, but there is diminished to absent petrous LEFT ICA flow. Tapering flow related enhancement is observed in a small but patent LEFT cervical ICA. LEFT M1 MCA small but patent. Severe stenosis proximal A2 LEFT ACA. Severely diminished flow related enhancement in the LEFT M2 and M3 vessels. No PCA stenosis or occlusion. No cerebellar branch occlusion. No intracranial aneurysm. Compared with the prior MRI from 11/17/2015, the infarctions are new, and the LEFT ICA appears to be further diminished caliber. IMPRESSION: Multifocal areas of acute infarction affecting the LEFT hemisphere without visible hemorrhage. It is unclear if these represent multiple emboli or watershed type phenomenon. No posterior fossa or RIGHT hemisphere involvement. Segmental areas of diminished or absent flow related enhancement in the LEFT ICA. String sign is suspected. Formal catheter angiogram recommended for further  evaluation to potentially re-establish flow in a diseased vessel. Critical Value/emergent results were called by telephone at the time of interpretation on 12/11/2015 at 1:49 pm to Dr.  Nemiah Commander , who verbally acknowledged these results. Electronically Signed   By: Elsie Stain M.D.   On: 12/11/2015 13:51   Mr Brain Wo Contrast  11/17/2015  CLINICAL DATA:  Patient became lethargic after house chores. Lethargy and confusion and dizziness. Syncopal episode. End-stage renal disease. EXAM: MRI HEAD WITHOUT CONTRAST TECHNIQUE: Multiplanar, multiecho pulse sequences of the brain and surrounding structures were obtained without intravenous contrast. COMPARISON:  CT head earlier today. FINDINGS: No evidence for acute infarction, hemorrhage, mass lesion, hydrocephalus, or extra-axial fluid. Mild cerebral and cerebellar atrophy, premature for age. Slight prominence of perivascular spaces, nonspecific, can be seen in chronic hypertension. 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 Paucity of white matter disease elsewhere. Diminished caliber of the LEFT internal carotid artery compared to the RIGHT in its petrous, cavernous, and supraclinoid segments, possibly also in the high cervical region. Extracranial stenosis or dissection could have this appearance. Symmetric and equal vertebral flow voids contribute to normal-appearing basilar artery. Normal midline structures.  No tonsillar herniation. Negative orbits, sinuses, and mastoids. RIGHT middle turbinate concha bullosa. No definite osseous findings. IMPRESSION: No acute intracranial findings.  Specifically no acute stroke. 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 Apparent diminished caliber of the LEFT internal carotid artery through much of its visualized course. A proximal extracranial stenosis or dissection is not excluded. Recommend CT angiography of the head and neck for further evaluation to evaluate the LEFT carotid system. Electronically Signed   By: Elsie Stain M.D.   On: 11/17/2015 21:02   US Carotid Bilateral  11/18/2015  CLINICAL DATA:  Cerebral  infarction. EXAM: BILATERAL CAROTID DUPLEX ULTRASOUND TECHNIQUE: Wallace Cullens scale imaging, color Doppler and duplex ultrasound were performed of bilateral carotid and vertebral arteries in the neck. COMPARISON:  None. FINDINGS: Criteria: Quantification of carotid stenosis is based on velocity parameters that correlate the residual internal carotid diameter with NASCET-based stenosis levels, using the diameter of the distal internal carotid lumen as the denominator for stenosis measurement. The following velocity measurements were obtained: RIGHT ICA:  54/19 cm/sec CCA:  91/28 cm/sec SYSTOLIC ICA/CCA RATIO:  0.6 DIASTOLIC ICA/CCA RATIO:  0.7 ECA:  45 cm/sec LEFT ICA:  25/11 cm/sec CCA:  65/13 cm/sec SYSTOLIC ICA/CCA RATIO:  0.4 DIASTOLIC ICA/CCA RATIO:  0.8 ECA:  49 cm/sec RIGHT CAROTID ARTERY: There is a mild amount of calcified plaque at the level of the distal common carotid artery, carotid bulb and proximal ICA. Based on velocities estimated right ICA stenosis is less than 50%. RIGHT VERTEBRAL ARTERY: Antegrade flow with normal waveform and velocity. LEFT CAROTID ARTERY: Moderate focal calcified plaque present at the level of the distal bulb and proximal ICA. Based on turbulent flow focally present in the plaque, there may be a component of plaque ulceration. Based on normal velocities, estimated left ICA stenosis is less than 50%. LEFT VERTEBRAL ARTERY: Antegrade flow with normal waveform and velocity. IMPRESSION: No significant carotid stenosis identified. Bilateral plaque noted at the level of the bulbs and proximal internal carotid arteries, left greater than right. The proximal ICA plaque on the left may be partially ulcerated. Estimated bilateral ICA stenoses are less than 50%. Electronically Signed   By: Irish Lack M.D.   On: 11/18/2015 15:37   Mr Maxine Glenn Head/brain Wo Cm  12/11/2015  CLINICAL DATA:  Patient with atrial fibrillation not on long-term anticoagulation, also end-stage renal disease,  hypertension, and diabetes, presents with disorder of speech 12/10/2015. Also Syncopal episode. EXAM: MRI HEAD WITHOUT CONTRAST MRA HEAD WITHOUT CONTRAST TECHNIQUE: Multiplanar, multiecho pulse sequences of the brain and surrounding structures were obtained without intravenous contrast. Angiographic images of the head were obtained using MRA technique without contrast. COMPARISON:  MR head 11/17/2015.  CT head 12/10/2015. FINDINGS: MRI HEAD FINDINGS Multifocal areas of acute infarction affect the LEFT hemisphere, including LEFT frontal, temporal, parietal, and occipital lobes, LEFT caudate nucleus and lentiform nucleus, and regional subcortical greater than periventricular white matter. No posterior fossa involvement. No RIGHT hemisphere involvement. These could be related to multiple emboli from the diseased LEFT ICA described below, or watershed type phenomenon from hypoperfusion. Normal for age cerebral volume. Paucity of white matter disease given patient's comorbidities. Remote RIGHT posterior frontal and subcortical infarction. No significant chronic hemorrhage. No midline abnormality. Extracranial soft tissues unremarkable. No osseous findings MRA HEAD FINDINGS The RIGHT internal carotid artery is widely patent. The basilar artery is widely patent with RIGHT vertebral dominant. There is a 50% stenosis of the non dominant LEFT vertebral at its junction with basilar. The RIGHT M1 MCA is widely patent. Minor non stenotic narrowing of the proximal RIGHT A1 ACA. The anterior communicating artery poorly seen. There is apparent cross fill right-to-left supplying the anterior circulation. There is some flow related enhancement of the LEFT ICA in its cavernous and supraclinoid segment, but there is diminished to absent petrous LEFT ICA flow. Tapering flow related enhancement is observed in a small but patent LEFT cervical ICA. LEFT M1 MCA small but patent. Severe stenosis proximal A2 LEFT ACA. Severely diminished flow  related enhancement in the LEFT M2 and M3 vessels. No PCA stenosis or occlusion. No cerebellar branch occlusion. No intracranial aneurysm. Compared with the prior MRI from 11/17/2015, the infarctions are new, and the LEFT ICA appears to be further diminished caliber. IMPRESSION: Multifocal areas of acute infarction affecting the LEFT hemisphere without visible hemorrhage. It is unclear if these represent multiple emboli or watershed type phenomenon. No posterior fossa or RIGHT hemisphere involvement. Segmental areas of diminished or absent flow related enhancement in the LEFT ICA. String sign is suspected. Formal catheter angiogram recommended for further evaluation to potentially re-establish flow in a diseased vessel. Critical Value/emergent results were called by telephone at the time of interpretation on 12/11/2015 at 1:49 pm to Dr. Nemiah Commander , who verbally acknowledged these results. Electronically Signed   By: Elsie Stain M.D.   On: 12/11/2015 13:51     Assessment and Plan   1. Elevated troponin: -Likely in the setting of the patient's acute stroke -However, she does have multiple risk factors for coronary artery disease and we are unable to rule this out at this time  -Echo pending to trend EF and wall motion and compare to echo done in the beginning of December -If EF is newly depressed/new WMA would need to discuss ischemic evaluation   2. Acute left ICA stroke: -Patient with disorder of speech/dysarthria  -Patient has remote history of PAF in 09/2013 in the setting of acute diarrhea illness, not on long term anticoagulation given this was a one time event -Imaging indicates severe intracranial stenosis involving the LICA cavernous/supraclinoid level -Her symptoms appear to be less likely 2/2 Afib -Currently on DAPT per neuro -Given her history of Afib she would benefit from anticoagulation in the future. Would likely use warfarin given her mitral disease, ESRD,  and weight. Would like  for neuro to comment on duration of treatment with DAPT if possible -Vascular has been consulted and felt intervention is not indicated at this time, continue optimal medical therapy   3. PAF: -Maintaining sinus rhythm (see comments for #11)  -CHADSVASc at least 4 (CHF, HTN, DM, female) -Toprol XL 25 mg held to allow for permissive BP per neuro requests   4. 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 ESRD: -Per Renal  7. HTN: -Permissive BP per neuro  8. Ongoing tobacco abuse: -Cessation advised  9. Anemia of chronic disease: -Maintain hgb >8.5  10. Hypokalemia: -Per Renal  11. NSVT: -Less frequent -Rare episodes since her admission -Beta blocker has been held to allow for more permissive BP -Should this ventricular ectopy become more frequent may need to consider an antiarrhythmic    Signed, Eula ListenRyan Dunn, PA-C Pager: [phone] 12/13/2015, 8:55 AM    Attending Note Patient seen and examined, agree with detailed note above,  Patient presentation and plan discussed on rounds.   Patient sleepy but arousable, says "hello doc" Family feels she is more communicative, saying more words, Working with PT today  Limited echo with depressed EF 35 to 40%, possible inferior/posterior wall motion abnormality, Dilated RV with reduced function  On aspirin plavix ----consider change to warfarin or NOAC (eliquis) in 2 weeks time given hx of atrial fib? This would not interfere with peritoneal HD as she has a port.   Consider outpt ischemia workup,  outpt TEE to evaluate mitral valve (discrepancy in echo findings on severity)   Signed: Dossie Arbourim Gollan  M.D.,  Ph.D. [facility]

## 2015-12-13 NOTE — Progress Notes (Signed)
I have left a message for pt's spouse to contact me to discuss recommendations for rehab venue when pt medically ready to d/c. Any rehab venue would have to be approved by [employer]. I have alerted RN CM and SW. [phone]

## 2015-12-13 NOTE — Progress Notes (Signed)
Pt's spouse contacted me to discuss her rehab venue options. He is in agreement to an acute hospital rehab stay and can arrange 24/7 supervision for pt at d/c from an inpt rehab stay. [facility] [place]. His second choice would be [facility] [place]. She is [employer] primary and Medicare secondary per spouse. I have left a voicemail for RN CM, Steward DroneBrenda. I will follow from a distance. [phone]

## 2015-12-13 NOTE — Plan of Care (Signed)
Problem: Safety: Goal: Ability to remain free from injury will improve Outcome: Progressing Pt remains free from injury this shift. Bed alarm in place.   Problem: Pain Managment: Goal: General experience of comfort will improve Outcome: Progressing Pt with complaint of headache x 1 this shift. PRN tylenol given with results noted.  Complaint of generalized pain 10/10 x1 this shift. MD notified and order for PRN morphine obtained. PRN morphine administered with relief noted.   Problem: Fluid Volume: Goal: Ability to maintain a balanced intake and output will improve Outcome: Progressing Pt undergoing peritoneal dialysis tonight. Weight to be obtained after dialysis completes.  Problem: Self-Care: Goal: Ability to communicate needs accurately will improve Outcome: Progressing Pt continues to have expressive aphasia. Pt gets easily frustrated when she can not get the right words out.  Problem: Nutrition: Goal: Risk of aspiration will decrease Outcome: Progressing Pt currently on dysphagia III diet. Pills are given whole in applesauce.  Encouraged to eat.  Pt has 1200 ml fluid restriction.

## 2015-12-14 LAB — CBC
HEMATOCRIT: 27.7 % — AB (ref 35.0–47.0)
HEMOGLOBIN: 8.9 g/dL — AB (ref 12.0–16.0)
MCH: 27.8 pg (ref 26.0–34.0)
MCHC: 32.1 g/dL (ref 32.0–36.0)
MCV: 86.7 fL (ref 80.0–100.0)
Platelets: 126 10*3/uL — ABNORMAL LOW (ref 150–440)
RBC: 3.19 MIL/uL — ABNORMAL LOW (ref 3.80–5.20)
RDW: 20.7 % — ABNORMAL HIGH (ref 11.5–14.5)
WBC: 4.8 10*3/uL (ref 3.6–11.0)

## 2015-12-14 NOTE — Care Management (Signed)
Sp[facility] with Jan[facility] [place]. Would like to see how Ms. Peddie progresses  over the weekend, Will send updated process notes to facility on Monday. Could possibly accept Ms. Ciani on Tuesday, Betti CruzHusband, [place], updated [phone]).  Gwenette GreetBrenda S [place] Shilling RN MSN [facility] Care Management [phone]

## 2015-12-14 NOTE — Care Management (Signed)
Spoke with Menicia, intake coordinator for [facility]. [phone]). States she is reviewing Ms. Troost's information and will need to discuss with husband concerning discharge plans prior to accepting Ms. Erpelding into their facility.  Gwenette GreetBrenda S Shayle Donahoo RN MSN CCM Care Management [phone]

## 2015-12-14 NOTE — Progress Notes (Signed)
[facility] - [place] at [facility]   PATIENT NAME: Dorothy CaffeyDonnella Long    MR#:  [id number]  DATE OF BIRTH:  Mar 15, 1961  SUBJECTIVE: She is admitted for altered mental status. Right-sided weakness found to have multiple infarcts on the left side. Patient is seen today. Awake alert oriented. Right-sided weakness is slightly better.   .   CHIEF COMPLAINT:   Chief Complaint  Patient presents with  . Altered Mental Status   - Patient admitted secondary to altered mental status. Aphasic now, also has right-sided weakness. -At baseline, patient is very communicative, alert and ambulates with a walker  REVIEW OF SYSTEMS:  Review of Systems  Constitutional: Negative for fever and chills.  HENT: Negative for hearing loss.   Eyes: Negative for blurred vision, double vision and photophobia.  Respiratory: Negative for cough, hemoptysis and shortness of breath.   Cardiovascular: Negative for palpitations, orthopnea and leg swelling.  Gastrointestinal: Negative for vomiting, abdominal pain and diarrhea.  Genitourinary: Negative for dysuria and urgency.  Musculoskeletal: Negative for myalgias and neck pain.  Skin: Negative for rash.  Neurological: Positive for speech change and focal weakness. Negative for dizziness, seizures, weakness and headaches.       Right-sided hemiplegia, slurred speech noted.  Psychiatric/Behavioral: Negative for memory loss. The patient does not have insomnia.   Dysarthria  DRUG ALLERGIES:   Allergies  Allergen Reactions  . Ciprocinonide [Fluocinolone] Hives  . Demerol [Meperidine] Nausea And Vomiting  . Diflucan [Fluconazole] Nausea Only  . Flagyl [Metronidazole] Hives and Itching  . Hydrocodone Itching  . Levaquin [Levofloxacin In D5w] Other (See Comments)    Numbness in legs  . Morphine And Related Nausea And Vomiting  . Tetracyclines & Related Itching and Swelling  . Valium [Diazepam] Nausea Only    hallucinations    VITALS:   Blood pressure 117/82, pulse 71, temperature 98.3 F (36.8 C), temperature source Oral, resp. rate 20, height 5\' 4"  (1.626 m), weight 49.261 kg (108 lb 9.6 oz), SpO2 100 %.  PHYSICAL EXAMINATION:  Physical Exam  Constitutional: She is oriented to person, place, and time and well-developed, well-nourished, and in no distress.  HENT:  Head: Normocephalic.  Right Ear: External ear normal.  Mouth/Throat: Oropharynx is clear and moist.  Eyes: Conjunctivae and EOM are normal. Pupils are equal, round, and reactive to light.  Neck: Neck supple. No tracheal deviation present. No thyromegaly present.  Cardiovascular: Normal rate, regular rhythm and normal heart sounds.  Exam reveals no gallop.   No murmur heard. Pulmonary/Chest: She has no wheezes. She has no rales.  Abdominal: She exhibits no distension. There is no tenderness. There is no rebound.  Musculoskeletal: Normal range of motion.  Lymphadenopathy:    She has no cervical adenopathy.  Neurological: She is alert and oriented to person, place, and time. Coordination normal.  Overall the right-sided weakness on the upper and lower extremities. It is 2/5.  Skin: Skin is dry.  Psychiatric: Memory and affect normal.      LABORATORY PANEL:   CBC  Recent Labs Lab 12/14/15 0726  WBC 4.8  HGB 8.9*  HCT 27.7*  PLT 126*   ------------------------------------------------------------------------------------------------------------------  Chemistries   Recent Labs Lab 12/10/15 2343  12/12/15 0340  NA 136  < > 135  K 3.2*  < > 3.5  CL 97*  < > 99*  CO2 22  < > 21*  GLUCOSE 128*  < > 93  BUN 63*  < > 62*  CREATININE 9.58*  < >  9.59*  CALCIUM 7.7*  < > 8.2*  AST 23  --   --   ALT 14  --   --   ALKPHOS 104  --   --   BILITOT 0.9  --   --   < > = values in this interval not displayed. ------------------------------------------------------------------------------------------------------------------  Cardiac Enzymes  Recent  Labs Lab 12/11/15 1502  TROPONINI 4.79*   ------------------------------------------------------------------------------------------------------------------  RADIOLOGY:  No results found.  EKG:   Orders placed or performed during the hospital encounter of 12/10/15  . ED EKG  . ED EKG  . EKG 12-Lead  . EKG 12-Lead  . EKG 12-Lead  . EKG 12-Lead    ASSESSMENT AND PLAN:   54 year old female with multiple medical problems including COPD, end-stage renal disease on peritoneal dialysis, hypertension, hyperlipidemia, congestive heart failure with systolic dysfunction, chronic anemia, history of atrial fibrillation, diabetes mellitus presents to the hospital secondary to change in mental status, speech and RIGHT-sided weakness.  #1 multifocal stroke: MRI of the brain showed a left MCA, PCA, MCA territories. Patient had a CT angiogram of the neck did not show any significant stenosis that needs to be operated. Continue aspirin, Plavix. Statins. Physical therapy consult speech therapy consult. . No lymphadenopathy. Patient needs outpatient 30 day loop moniotr evaluation of atrial fibrillation. Continue dysphagia 3 diet.  appreciate Vascular, and neurology evaluations. Physical therapy recommends inpatient rehabilitation. Case management is working on getting of bed at [facility]. [facility] want to see how patient progresses over the weekend.   -Most recent echo 3 weeks ago with no clot. - #2 elevated troponin-possibly demand ischemia. Patient denies any chest pain. -Has stroke and also an end-stage renal dialysis patient. -Continue to monitor troponin.  Cardiology is  following patient needs outpatient TEE. #3 history of paroxysmal atrial fibrillation with 17 beats of nonsustained V. tach on telemetry. -Appreciate cardiology consult. -Metoprolol discontinued for now due to acute stroke and permissive hypertension requested Patient needs outpatient TEE. #4  end-stage renal disease on peritoneal  dialysis-also has hemodialysis catheter. Prior history of peritonitis and abdominal hernias. -Nephrology consulted.  #5 anemia of chronic disease-stable. -With acute stroke, likely not a candidate for Procrit  #6 hypokalemia-per nephrology.  #7 tobacco use disorder-smoking cessation advised  # 8 DVT prophylaxis-on subcutaneous heparin Downgraded to regular stroke unit.  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  All the records are reviewed and case discussed with Care Management/Social Workerr. Management plans discussed with the patient, family and they are in agreement.  CODE STATUS: Full Code  TOTAL TIME SPENT IN TAKING CARE OF THIS PATIENT: 45 minutes.   POSSIBLE D/C IN 2-3 DAYS, DEPENDING ON CLINICAL CONDITION.   Katha Hamming M.D on 12/14/2015 at 2:12 PM  Between 7am to 6pm - Pager - [phone]  After 6pm go to [email or web address] - password [email or web address] [facility]  [facility]  Office  [phone]  CC: Primary care physician; Sherlene Shams, MD

## 2015-12-14 NOTE — Progress Notes (Signed)
PT disconnected from the cycler. UF , Vitals stable 97.3 temp, 102 pulse, 18 resp, 127/85 B/P. Pt tolerated tx. Effluent clear with a reddish tinge.  Disconnect cap connected. Report given to floor RN.  Pt stable.

## 2015-12-14 NOTE — Care Management (Signed)
Occupational therapy, speech therapy, physical therapy, History & Physical, and face [facility], as requested per husband, Dewight. Gwenette GreetBrenda S Ruwayda Curet RN MSN CCM Care Management [phone]

## 2015-12-14 NOTE — Progress Notes (Signed)
Occupational Therapy Treatment Patient Details Name: Dorothy Long MRN: [id number] DOB: 07/25/1961 Today's Date: 12/14/2015    History of present illness This 54 year old female presented to ER after being found on floor by husband, + LOC, noted with impaired speech and R-sided weakness upon awakening; admitted for acute CVA.  MRI significant for multifocal infarcts throughout L hemisphere.     OT comments  Patient seen supine for active assistive and muscle stimulation. Patient able to flex shoulder to ~ 25 degrees with active assistive. No external/internal rotation, no elbow extension despite tapping. Weak elbow flexion, supination and pronation, wrist extension and flexion (aided by finger motions) finger flexion and extension. Upon entering room patient's arm laying at side. Repositioned in elevation. Cues needed for positioning. Instructed patient and family in purpose of elevation (to prevent edema).  Follow Up Recommendations  CIR    Equipment Recommendations       Recommendations for Other Services      Precautions / Restrictions Precautions Precautions: Fall Restrictions Weight Bearing Restrictions: No       Mobility Bed Mobility                  Transfers                      Balance                                   ADL                                                Vision                     Perception     Praxis      Cognition   Behavior During Therapy: Flat affect;WFL for tasks assessed/performed Overall Cognitive Status: Within Functional Limits for tasks assessed Area of Impairment: Safety/judgement;Awareness;Problem solving          Safety/Judgement: Decreased awareness of safety          Extremity/Trunk Assessment               Exercises     Shoulder Instructions       General Comments      Pertinent Vitals/ Pain       Pain Assessment: No/denies  pain  Home Living   Living Arrangements: Spouse/significant other Available Help at Discharge: Family Type of Home: House Home Access: Stairs to enter                                Prior Functioning/Environment              Frequency  (will need daily OT)     Progress Toward Goals  OT Goals(current goals can now be found in the care plan section)     Acute Rehab OT Goals Patient Stated Goal: OK  Plan      Co-evaluation                 End of Session     Activity Tolerance     Patient Left in bed;with call bell/phone within reach;with bed alarm set;with family/visitor present   Nurse  Communication          Time: 1348-1400 OT Time Calculation (min): 12 min  Charges: OT General Charges $OT Visit: 1 Procedure OT Treatments $Neuromuscular Re-education: 8-22 mins Ocie CornfieldJohn M Deseri Loss, MS/OTR/L  Elyn PeersHuff, Rocklyn Mayberry M 12/14/2015, 2:11 PM

## 2015-12-14 NOTE — Progress Notes (Signed)
Physical Therapy Treatment Patient Details Name: Dorothy Long MRN: [id number] DOB: [birth year] Today's Date: 12/14/2015    History of Present Illness presented to ER after being found on floor by husband, + LOC, noted with impaired speech and R-sided weakness upon awakening; admitted for acute CVA.  MRI significant for multifocal infarcts throughout L hemisphere.  Of note, patient with elevated troponin (peak 4.79), likely demand ischemia due to acute CVA per cardiology; no invasive work-up recommneded at this time.    PT Comments    Patient with progressive increase in functional performance and overall activity tolerance this date.  Initiating more active use of R UE, but has more functional ability distally > proximally.  Mild higher-level pushing behaviors noted with standing/gait activities, but with good response to weight shifting activities/faciltiation during mobility efforts.   Follow Up Recommendations  CIR     Equipment Recommendations       Recommendations for Other Services       Precautions / Restrictions Precautions Precautions: Fall Precaution Comments: Seizure, L AVF (No BP L UE), Tenkoff catheter (PD) Restrictions Weight Bearing Restrictions: No    Mobility  Bed Mobility Overal bed mobility: Needs Assistance Bed Mobility: Supine to Sit     Supine to sit: Min assist     General bed mobility comments: for position/protection of R hemi-body with transition towards L; mod assist to scoot forward to edge of bed  Transfers Overall transfer level: Needs assistance Equipment used: Rolling walker (2 wheeled);1 person hand held assist Transfers: Sit to/from Stand Sit to Stand: Min assist;Mod assist         General transfer comment: improved task initiation  Ambulation/Gait Ambulation/Gait assistance: Mod assist;+2 physical assistance Ambulation Distance (Feet): 40 Feet Assistive device: 2 person hand held assist       General Gait Details:  step to gait pattern, mod assist +1 for L anterior/lateral weight shift and R hip/knee control in loading phases of gait cycle.  Physical assist for R LE advancement.  Poor dynamic balance; high fall risk. Mild higher-level pushing behaviors noted   Stairs            Wheelchair Mobility    Modified Rankin (Stroke Patients Only)       Balance Overall balance assessment: Needs assistance Sitting-balance support: No upper extremity supported Sitting balance-Leahy Scale: Good     Standing balance support: Bilateral upper extremity supported Standing balance-Leahy Scale: Poor                      Cognition Arousal/Alertness: Awake/alert                          Exercises Other Exercises Other Exercises: Unsupported standing with bilat UEs on elevated hospital table anterior to patient, participated with alternate UE dyanaminc reaching/grasping tasks (moving objects R --> L in modified D2 extension pattern).  Able to verbally identify objects with 80-90% accuracy this date; requires hand-over-hand assist for R UE shoulder movement, but able to initiate grasp/release without physical assist.  Mod asist for standing balance; higher-level pushing behaviors noted with dynamic weight shifting    General Comments        Pertinent Vitals/Pain Pain Assessment: No/denies pain    Home Living                      Prior Function            PT Goals (current  goals can now be found in the care plan section) Acute Rehab PT Goals Patient Stated Goal: "I want to be better" PT Goal Formulation: With patient/family Time For Goal Achievement: 12/26/15 Potential to Achieve Goals: Good Progress towards PT goals: Progressing toward goals    Frequency  7X/week    PT Plan Current plan remains appropriate    Co-evaluation             End of Session Equipment Utilized During Treatment: Gait belt Activity Tolerance: Patient tolerated treatment  well Patient left: in chair;with call bell/phone within reach;with chair alarm set     Time: 1308-6578 PT Time Calculation (min) (ACUTE ONLY): 29 min  Charges:  $Gait Training: 8-22 mins $Neuromuscular Re-education: 8-22 mins                    G Codes:      Ahri Olson H. Manson Passey, PT, DPT, NCS 12/14/2015, 9:51 AM [phone]

## 2015-12-14 NOTE — Progress Notes (Signed)
[facility]  ROUNDING NOTE   Subjective:  Family at bedside.  PD last night. UF of 412 mL. Some pinkish dialysate.    Objective:  Vital signs in last 24 hours:  Temp:  [97.3 F (36.3 C)-98.7 F (37.1 C)] 98.3 F (36.8 C) (12/30 1333) Pulse Rate:  [71-87] 71 (12/30 1333) Resp:  [16-20] 20 (12/30 1333) BP: (117-134)/(82-91) 117/82 mmHg (12/30 1333) SpO2:  [98 %-100 %] 100 % (12/30 1333)  Weight change: -0.499 kg (-1 lb 1.6 oz) Filed Weights   12/12/15 0400 12/12/15 1317 12/13/15 1227  Weight: 49 kg (108 lb 0.4 oz) 49.76 kg (109 lb 11.2 oz) 49.261 kg (108 lb 9.6 oz)    Intake/Output: I/O last 3 completed shifts: In: 0  Out: 791 [Other:791]   Intake/Output this shift:  Total I/O In: -  Out: 412 [Other:412]  Physical Exam: General: NAD, resting in bed, confused.  Head: Normocephalic, atraumatic. Moist oral mucosal membranes  Eyes: Anicteric  Neck: Supple, trachea midline  Lungs:  Clear to auscultation normal effort  Heart: S1S2 no rubs  Abdomen:  Soft, nontender, BS present   Extremities: no peripheral edema.  Neurologic: Awake, follows commands with all 4 extremities   Skin: No lesions  Access: PD catheter in place    Basic Metabolic Panel:  Recent Labs Lab 12/10/15 2343 12/11/15 0452 12/12/15 0340 12/13/15 1257  NA 136 136 135  --   K 3.2* 3.5 3.5  --   CL 97* 100* 99*  --   CO2 22 22 21*  --   GLUCOSE 128* 107* 93  --   BUN 63* 65* 62*  --   CREATININE 9.58* 9.71* 9.59*  --   CALCIUM 7.7* 7.8* 8.2*  --   PHOS  --   --   --  9.3*    Liver Function Tests:  Recent Labs Lab 12/10/15 2343  AST 23  ALT 14  ALKPHOS 104  BILITOT 0.9  PROT 6.2*  ALBUMIN 3.0*   No results for input(s): LIPASE, AMYLASE in the last 168 hours. No results for input(s): AMMONIA in the last 168 hours.  CBC:  Recent Labs Lab 12/10/15 2343 12/11/15 0452 12/14/15 0726  WBC 6.3 6.0 4.8  NEUTROABS  --  4.9  --   HGB 9.2* 8.4* 8.9*  HCT 29.1* 25.8*  27.7*  MCV 88.2 86.2 86.7  PLT 150 147* 126*    Cardiac Enzymes:  Recent Labs Lab 12/10/15 2343 12/11/15 0045 12/11/15 0854 12/11/15 1502  TROPONINI 0.25* 0.64* 3.18* 4.79*    BNP: Invalid input(s): POCBNP  CBG: No results for input(s): GLUCAP in the last 168 hours.  Microbiology: Results for orders placed or performed during the hospital encounter of 12/10/15  MRSA PCR Screening     Status: None   Collection Time: 12/11/15  8:19 PM  Result Value Ref Range Status   MRSA by PCR NEGATIVE NEGATIVE Final    Comment:        The GeneXpert MRSA Assay (FDA approved for NASAL specimens only), is one component of a comprehensive MRSA colonization surveillance program. It is not intended to diagnose MRSA infection nor to guide or monitor treatment for MRSA infections.     Coagulation Studies: No results for input(s): LABPROT, INR in the last 72 hours.  Urinalysis:  Recent Labs  12/12/15 1814  COLORURINE YELLOW*  LABSPEC 1.024  PHURINE 5.0  GLUCOSEU NEGATIVE  HGBUR 1+*  BILIRUBINUR NEGATIVE  KETONESUR NEGATIVE  PROTEINUR 100*  NITRITE NEGATIVE  LEUKOCYTESUR NEGATIVE      Imaging: No results found.   Medications:     . aspirin EC  81 mg Oral Daily  . atorvastatin  20 mg Oral Daily  . calcium acetate  667 mg Oral TID  . clopidogrel  75 mg Oral Daily  . dialysis solution 1.5% low-MG/low-CA   Intraperitoneal Q24H  . feeding supplement (ENSURE ENLIVE)  237 mL Oral Q24H  . gentamicin cream  1 application Topical Daily  . heparin  5,000 Units Subcutaneous 3 times per day  . sevelamer carbonate  1,600 mg Oral TID WC  . sodium chloride  3 mL Intravenous Q12H  . tiotropium  18 mcg Inhalation Daily   acetaminophen **OR** acetaminophen, albuterol, ALPRAZolam, morphine injection, ondansetron (ZOFRAN) IV, senna-docusate  Assessment/ Plan:  54 y.o. female with PMHx of of hypertension, hyperlipidemia, vitamin D deficiency, tobacco abuse, edema,  cholecystectomy,hysterectomy, ESRD secondary to FSGS, SHPTH, severe mitral stenosis, Liver cirrhosis, inguinal hernia repair.  ADmitted 12/10/15 with fall from home and found to have multiple acute areas of infarction in the left cerebral hemisphere.  1.  End-stage renal disease on peritoneal dialysis.  Continue current prescription of 1.5% dextrose solution.   - check PD fluid due to discoloration.   2.  Anemia chronic kidney disease.  Hemoglobin 8.9.   We are avoiding Epogen given multiple areas of cerebral infarction.  3.  Secondary hyperparathyroidism.  Phos 9.3. - calcium acetate, sevelamer  4.  Acute CVA.  Multiple areas of acute infarction involving the left hemisphere noted. - appreciate neurology input.   5. Hypertension: will allow to rise due to recent ischemic event.   LOS: 3 Dorothy Long 12/30/20162:55 PM

## 2015-12-14 NOTE — Consult Note (Signed)
CC: aphasia   HPI: Dorothy Long is an 54 y.o. female a known history of hypertension, hyperlipidemia, COPD, end-stage renal disease on peritoneal dialysis, congestive heart failure, anemia presented to the emergency room because of difficulty in speech and syncope.  As per family they heart a thud after which pt was dysarthric and had R sided weakness.    Still R sided hemiparesis.    Past Medical History  Diagnosis Date  . Hypertension   . Hyperlipidemia   . COPD (chronic obstructive pulmonary disease) (HCC)     a. not on home O2  . Anemia   . Valvular disease     a. 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 echo 2015: EF 45-50%, borderline LVH, mod dilated LA, mildly dilated RA, small pericardial effusion, mod MR (rheumatic deformity), MS mild-mod, no gradient, PASP ele  . Chronic combined systolic and diastolic CHF (congestive heart failure) (HCC)     a. echo reports above  . PAF (paroxysmal atrial fibrillation) (HCC)     a. in setting of diarrhea 09/21/2013; b. not on long term full dose anticoagulation; c. CHADSVASC at least 4 (CHF, HTN, DM, female)  . Diabetes mellitus (HCC)   . Peritoneal dialysis status (HCC) 4- 15-15  . Ventral hernia   . Inguinal hernia   . History of stress test     a. 2014: no convincing evidence of pharmacologically induced ischemia,  apparent ant apical defect present only on attenuation corrected images favored to reflect artifact due to overcorrection & may be worse on stress imaging secondary to greater patient motion. A small area of ischemia is felt less likely but is difficult to entirely exclude, EF 55%  . ESRD (end stage renal disease) on dialysis (HCC)     a. HD on T,T,S; b. 2/2 focal segmental glomerulosclerosis   . Dialysis patient (HCC)   . Renal insufficiency    . Stroke [facility](HCC) 11/2015    a. MRI L cereblal infarctions in the MCA/PCA, ACA/MCA territory; b. felt to be carotid in etiology; c. medically managed on DAPT per neuro    Past Surgical History  Procedure Laterality Date  . Abdominal hysterectomy    . Cholecystectomy    . Knee surgery Right   . Foot surgery Bilateral   . Av fistula placement Left 10-26-13    Dr Gilda CreaseSchnier  . Peritoneal catheter insertion  03-29-14    Dr Gilda CreaseSchnier  . Inguinal hernia repair Left 07/13/2015    Procedure: HERNIA REPAIR INGUINAL ADULT;  Surgeon: Earline MayotteJeffrey W Byrnett, MD;  Location: [facility] ORS;  Service: General;  Laterality: Left;  Marland Kitchen. Ventral hernia repair N/A 07/13/2015    Procedure: HERNIA REPAIR VENTRAL ADULT;  Surgeon: Earline MayotteJeffrey W Byrnett, MD;  Location: [facility] ORS;  Service: General;  Laterality: N/A;    Family History  Problem Relation Age of Onset  . Cancer Mother     colon  . Heart disease Father   . Hypertension Father     Social History:  reports that she has been smoking Cigarettes.  She has a 12.5 pack-year smoking history. She has never used smokeless tobacco. She reports that she does not drink alcohol or use illicit drugs.  Allergies  Allergen Reactions  . Ciprocinonide [Fluocinolone] Hives  . Demerol [Meperidine] Nausea And Vomiting  . Diflucan [Fluconazole] Nausea Only  .  Flagyl [Metronidazole] Hives and Itching  . Hydrocodone Itching  . Levaquin [Levofloxacin In D5w] Other (See Comments)    Numbness in legs  . Morphine And Related Nausea And Vomiting  . Tetracyclines & Related Itching and Swelling  . Valium [Diazepam] Nausea Only    hallucinations    Medications: I have reviewed the patient's current medications.  ROS: Unable to obtain due to dysarthria  Physical Examination: Blood pressure 117/82, pulse 71, temperature 98.3 F (36.8 C), temperature source Oral, resp. rate 20, height  (1.626 m), weight 108 lb 9.6 oz (49.261 kg), SpO2 100 %.   Neurological Examination Mental  Status: Speech much improved. Pt is following commands and able to articulate name and place.   Cranial Nerves: II: Discs flat bilaterally; Visual fields grossly normal, pupils equal, round, reactive to light and accommodation III,IV, VI: ptosis not present, extra-ocular motions intact bilaterally V,VII: smile symmetric, facial light touch sensation normal bilaterally VIII: hearing normal bilaterally IX,X: gag reflex present XI: bilateral shoulder shrug XII: midline tongue extension Motor: Right : Upper extremity   3/5    Left:     Upper extremity   4/5  Lower extremity   4/5     Lower extremity   4/5 Tone and bulk:normal tone throughout; no atrophy noted Sensory: Pinprick and light touch intact throughout, bilaterally Deep Tendon Reflexes: 2+ and symmetric throughout Plantars: Right: downgoing   Left: downgoing Cerebellar: Not tested  Gait: not tested       Laboratory Studies:   Basic Metabolic Panel:  Recent Labs Lab 12/10/15 2343 12/11/15 0452 12/12/15 0340 12/13/15 1257  NA 136 136 135  --   K 3.2* 3.5 3.5  --   CL 97* 100* 99*  --   CO2 22 22 21*  --   GLUCOSE 128* 107* 93  --   BUN 63* 65* 62*  --   CREATININE 9.58* 9.71* 9.59*  --   CALCIUM 7.7* 7.8* 8.2*  --   PHOS  --   --   --  9.3*    Liver Function Tests:  Recent Labs Lab 12/10/15 2343  AST 23  ALT 14  ALKPHOS 104  BILITOT 0.9  PROT 6.2*  ALBUMIN 3.0*   No results for input(s): LIPASE, AMYLASE in the last 168 hours. No results for input(s): AMMONIA in the last 168 hours.  CBC:  Recent Labs Lab 12/10/15 2343 12/11/15 0452 12/14/15 0726  WBC 6.3 6.0 4.8  NEUTROABS  --  4.9  --   HGB 9.2* 8.4* 8.9*  HCT 29.1* 25.8* 27.7*  MCV 88.2 86.2 86.7  PLT 150 147* 126*    Cardiac Enzymes:  Recent Labs Lab 12/10/15 2343 12/11/15 0045 12/11/15 0854 12/11/15 1502  TROPONINI 0.25* 0.64* 3.18* 4.79*    BNP: Invalid input(s): POCBNP  CBG: No results for input(s): GLUCAP in the last  168 hours.  Microbiology: Results for orders placed or performed during the hospital encounter of 12/10/15  MRSA PCR Screening     Status: None   Collection Time: 12/11/15  8:19 PM  Result Value Ref Range Status   MRSA by PCR NEGATIVE NEGATIVE Final    Comment:        The GeneXpert MRSA Assay (FDA approved for NASAL specimens only), is one component of a comprehensive MRSA colonization surveillance program. It is not intended to diagnose MRSA infection nor to guide or monitor treatment for MRSA infections.     Coagulation Studies: No results for input(s): LABPROT, INR  in the last 72 hours.  Urinalysis:   Recent Labs Lab 12/12/15 1814  COLORURINE YELLOW*  LABSPEC 1.024  PHURINE 5.0  GLUCOSEU NEGATIVE  HGBUR 1+*  BILIRUBINUR NEGATIVE  KETONESUR NEGATIVE  PROTEINUR 100*  NITRITE NEGATIVE  LEUKOCYTESUR NEGATIVE    Lipid Panel:     Component Value Date/Time   CHOL 137 12/11/2015 0452   CHOL 120 05/03/2014 1816   TRIG 71 12/11/2015 0452   TRIG 120 05/03/2014 1816   HDL 42 12/11/2015 0452   HDL 44 05/03/2014 1816   CHOLHDL 3.3 12/11/2015 0452   VLDL 14 12/11/2015 0452   VLDL 24 05/03/2014 1816   LDLCALC 81 12/11/2015 0452   LDLCALC 52 05/03/2014 1816    HgbA1C: No results found for: HGBA1C  Urine Drug Screen:      Component Value Date/Time   LABOPIA NONE DETECTED 11/18/2015 1122   LABBENZ NONE DETECTED 11/18/2015 1122   AMPHETMU NONE DETECTED 11/18/2015 1122   THCU NONE DETECTED 11/18/2015 1122   LABBARB NONE DETECTED 11/18/2015 1122    Alcohol Level: No results for input(s): ETH in the last 168 hours.  Other results: EKG: normal EKG, normal sinus rhythm, unchanged from previous tracings.  Imaging: No results found.   Assessment/Plan:   54 y.o. female a known history of hypertension, hyperlipidemia, COPD, end-stage renal disease on peritoneal dialysis, congestive heart failure, anemia presented to the emergency room because of difficulty in  speech and syncope.  As per family they heart a thud after which pt was dysarthric and had L sided weakness.   Exam much improved today still has R sided weakness. Appreciate vascular consultation.   MRI L cereblal infarctions in the MCA/PCA, ACA/MCA territory.    No further testing from neuro stand point.   D/c planning Call with questions    Pauletta Browns    12/14/2015, 1:57 PM

## 2015-12-14 NOTE — Progress Notes (Signed)
Speech Language Pathology Dysphagia Treatment Patient Details Name: Dorothy Long MRN: [id number] DOB: 21-Aug-1961 Today's Date: 12/14/2015 Time: [id number] SLP Time Calculation (min) (ACUTE ONLY): 21 min  Assessment / Plan / Recommendation Clinical Impression   pt continues to present with a mild oral phase dysphagia characterized by lowed oral transit possibly secondary to pt overall decreased processing. Pt was assessed to be wfl with communication and improving cognitive status as compared to previous visits. Pt able to answer all questions and oriented x 3.  Husband present for treatment and stated she was more alert and closer to baseline today.  Pt with no overt ssx aspiration with all po trials and is recommended to remain on current diet.     Diet Recommendation    Reg with thin   SLP Plan Continue with current plan of care   Pertinent Vitals/Pain No pain reported.   Swallowing Goals     General Behavior/Cognition: Alert;Cooperative;Pleasant mood Patient Positioning: Upright in bed Oral care provided: N/A HPI: Dorothy CaffeyDonnella Long is a 54 y.o. female with a known history of hypertension, hyperlipidemia, COPD, end-stage renal disease on peritoneal dialysis, congestive heart failure, anemia presented to the emergency room because of difficulty in speech and syncope. Patient usually does peritoneal dialysis around 9 PM in the night every day. Yesterday she also hooked up for peritoneal dialysis, but could not complete it. Patient's husband found her on the floor with loss of consciousness. Patient's husband is at bedside and says it was no evidence of any seizure. After the patient woke up, she was not able to speak and she was brought to the emergency room. The above event happened around 9 PM last night. Tele neurology consult was done in the emergency room, they recommended baby aspirin and said patient is out of the window for any thrombolytic therapy. Upon evaluation in the ER,  patient is able to move all extremities. She was able to express words and talk but had slurred speech and dysarthria. No tingling or numbness in any part of the body. No history of any head injury. Patient was recently worked up for stroke the first week of December at our hospital. Per nsg pt has not been eating very much on Dysphagia III diet w/thin.      Dysphagia Treatment Family/Caregiver Educated: Pt's family Treatment Methods: Skilled observation;Upgraded PO texture trial;Patient/caregiver education Patient observed directly with PO's: Yes Type of PO's observed: Regular;Dysphagia 3 (soft);Thin liquids Feeding: Able to feed self Liquids provided via: Straw Amount of cueing: Independent   GO     Dorothy Long 12/14/2015, 11:47 AM

## 2015-12-14 NOTE — Plan of Care (Signed)
Problem: Safety: Goal: Ability to remain free from injury will improve Outcome: Progressing Bed and chair alarm in use.  Gait unsteady calls for assist.  Problem: Pain Managment: Goal: General experience of comfort will improve Outcome: Progressing No c/o pain today  Problem: Physical Regulation: Goal: Ability to maintain clinical measurements within normal limits will improve Outcome: Not Progressing Gait unsteady. Weaker this pm with motor skills  Problem: Skin Integrity: Goal: Risk for impaired skin integrity will decrease Outcome: Progressing No impairment of skin  Problem: Tissue Perfusion: Goal: Risk factors for ineffective tissue perfusion will decrease Outcome: Progressing Receiving heparin injection  Problem: Activity: Goal: Risk for activity intolerance will decrease Outcome: Progressing P.t. Saw today. oob to recliner with  Assist. See p.t. Note. Assisted back to bed  Problem: Nutrition: Goal: Adequate nutrition will be maintained Outcome: Progressing Pt eating food from outside. tol well  Problem: Self-Care: Goal: Ability to participate in self-care as condition permits will improve Outcome: Progressing Pt assists with care

## 2015-12-15 LAB — BODY FLUID CELL COUNT WITH DIFFERENTIAL
Eos, Fluid: 5 %
LYMPHS FL: 56 %
MONOCYTE-MACROPHAGE-SEROUS FLUID: 30 %
Neutrophil Count, Fluid: 9 %
Other Cells, Fluid: 0 %
Total Nucleated Cell Count, Fluid: 8 cu mm

## 2015-12-15 NOTE — Progress Notes (Signed)
[facility]  ROUNDING NOTE   Subjective:  Family at bedside.  PD last night. UF of 593 mL. Some pinkish dialysate. Cell count and culture sent   Objective:  Vital signs in last 24 hours:  Temp:  [98.2 F (36.8 C)-99.2 F (37.3 C)] 98.2 F (36.8 C) (12/31 0459) Pulse Rate:  [71-88] 82 (12/31 0459) Resp:  [16-20] 17 (12/31 0459) BP: (116-125)/(76-88) 120/88 mmHg (12/31 0459) SpO2:  [98 %-100 %] 98 % (12/31 0459) Weight:  [50 kg (110 lb 3.7 oz)] 50 kg (110 lb 3.7 oz) (12/31 0700)  Weight change: 0.739 kg (1 lb 10.1 oz) Filed Weights   12/12/15 1317 12/13/15 1227 12/15/15 0700  Weight: 49.76 kg (109 lb 11.2 oz) 49.261 kg (108 lb 9.6 oz) 50 kg (110 lb 3.7 oz)    Intake/Output: I/O last 3 completed shifts: In: 123 [P.O.:120; I.V.:3] Out: 412 [Other:412]   Intake/Output this shift:  Total I/O In: 6500 [Other:6500] Out: 7093 [Other:7093]  Physical Exam: General: NAD, resting in bed, confused.  Head: Normocephalic, atraumatic. Moist oral mucosal membranes  Eyes: Anicteric  Neck: Supple, trachea midline  Lungs:  Clear to auscultation normal effort  Heart: S1S2 no rubs  Abdomen:  Soft, nontender, BS present   Extremities: no peripheral edema.  Neurologic: Awake, follows commands with all 4 extremities   Skin: No lesions  Access: PD catheter in place    Basic Metabolic Panel:  Recent Labs Lab 12/10/15 2343 12/11/15 0452 12/12/15 0340 12/13/15 1257  NA 136 136 135  --   K 3.2* 3.5 3.5  --   CL 97* 100* 99*  --   CO2 22 22 21*  --   GLUCOSE 128* 107* 93  --   BUN 63* 65* 62*  --   CREATININE 9.58* 9.71* 9.59*  --   CALCIUM 7.7* 7.8* 8.2*  --   PHOS  --   --   --  9.3*    Liver Function Tests:  Recent Labs Lab 12/10/15 2343  AST 23  ALT 14  ALKPHOS 104  BILITOT 0.9  PROT 6.2*  ALBUMIN 3.0*   No results for input(s): LIPASE, AMYLASE in the last 168 hours. No results for input(s): AMMONIA in the last 168 hours.  CBC:  Recent Labs Lab  12/10/15 2343 12/11/15 0452 12/14/15 0726  WBC 6.3 6.0 4.8  NEUTROABS  --  4.9  --   HGB 9.2* 8.4* 8.9*  HCT 29.1* 25.8* 27.7*  MCV 88.2 86.2 86.7  PLT 150 147* 126*    Cardiac Enzymes:  Recent Labs Lab 12/10/15 2343 12/11/15 0045 12/11/15 0854 12/11/15 1502  TROPONINI 0.25* 0.64* 3.18* 4.79*    BNP: Invalid input(s): POCBNP  CBG: No results for input(s): GLUCAP in the last 168 hours.  Microbiology: Results for orders placed or performed during the hospital encounter of 12/10/15  MRSA PCR Screening     Status: None   Collection Time: 12/11/15  8:19 PM  Result Value Ref Range Status   MRSA by PCR NEGATIVE NEGATIVE Final    Comment:        The GeneXpert MRSA Assay (FDA approved for NASAL specimens only), is one component of a comprehensive MRSA colonization surveillance program. It is not intended to diagnose MRSA infection nor to guide or monitor treatment for MRSA infections.     Coagulation Studies: No results for input(s): LABPROT, INR in the last 72 hours.  Urinalysis:  Recent Labs  12/12/15 1814  COLORURINE YELLOW*  LABSPEC 1.024  PHURINE 5.0  GLUCOSEU NEGATIVE  HGBUR 1+*  BILIRUBINUR NEGATIVE  KETONESUR NEGATIVE  PROTEINUR 100*  NITRITE NEGATIVE  LEUKOCYTESUR NEGATIVE      Imaging: No results found.   Medications:     . aspirin EC  81 mg Oral Daily  . atorvastatin  20 mg Oral Daily  . calcium acetate  667 mg Oral TID  . clopidogrel  75 mg Oral Daily  . dialysis solution 1.5% low-MG/low-CA   Intraperitoneal Q24H  . feeding supplement (ENSURE ENLIVE)  237 mL Oral Q24H  . gentamicin cream  1 application Topical Daily  . heparin  5,000 Units Subcutaneous 3 times per day  . sevelamer carbonate  1,600 mg Oral TID WC  . sodium chloride  3 mL Intravenous Q12H  . tiotropium  18 mcg Inhalation Daily   acetaminophen **OR** acetaminophen, albuterol, ALPRAZolam, morphine injection, ondansetron (ZOFRAN) IV, senna-docusate  Assessment/  Plan:  54 y.o. female with PMHx of of hypertension, hyperlipidemia, vitamin D deficiency, tobacco abuse, edema, cholecystectomy,hysterectomy, ESRD secondary to FSGS, SHPTH, severe mitral stenosis, Liver cirrhosis, inguinal hernia repair.  ADmitted 12/10/15 with fall from home and found to have multiple acute areas of infarction in the left cerebral hemisphere.  1.  End-stage renal disease on peritoneal dialysis.  Continue current prescription of 1.5% dextrose solution.   - check PD fluid culture and cell count - gentamicin to exit site  2.  Anemia chronic kidney disease.  Hemoglobin 8.9.   - avoiding Epogen given multiple areas of cerebral infarction.  3.  Secondary hyperparathyroidism.  Phos 9.3. - calcium acetate, sevelamer with meals  4.  Acute CVA.  Multiple areas of acute infarction involving the left hemisphere noted. - appreciate neurology input.   5. Hypertension: will allow to rise due to recent ischemic event. Currently not on any agents.   LOS: 4 Casilda Pickerill 12/31/201611:43 AM

## 2015-12-15 NOTE — Progress Notes (Signed)
Physical Therapy Treatment Patient Details Name: Dorothy Long MRN: [id number] DOB: [birth year] Today's Date: 12/15/2015    History of Present Illness presented to ER after being found on floor by husband, + LOC, noted with impaired speech and R-sided weakness upon awakening; admitted for acute CVA.  MRI significant for multifocal infarcts throughout L hemisphere.  Of note, patient with elevated troponin (peak 4.79), likely demand ischemia due to acute CVA per cardiology; no invasive work-up recommneded at this time.    PT Comments    Pt appears and confirms higher level of lethargy today, reluctant to participate in gait and transfers training but agreeable to do lower level activities. RUE appears to be more impaired since PT visit yesterday. Ambulation remains unchanged in terms of quality. Expressive aphasia remains strong, pt able to respond with yes and no easily, and occasional verbal mannerisms. Pt able to read RN number aloud from board, with some error, but able to self correct when asked to retry, also demonstrating the ability to dial RN number on phone, and locate/press call bell as needed. Encouraged pt to rest when she gets tired, in spite of frequent guests.   Follow Up Recommendations  CIR     Equipment Recommendations  None recommended by PT    Recommendations for Other Services       Precautions / Restrictions Precautions Precautions: Fall Precaution Comments: Seizure, L AVF (No BP L UE), Tenkoff catheter (PD) Restrictions Weight Bearing Restrictions: No    Mobility  Bed Mobility Overal bed mobility: Needs Assistance Bed Mobility:  (long sitting --> sitting EOB )     Supine to sit: Min assist     General bed mobility comments: Physical assistanc with LLE and RUE.   Transfers Overall transfer level: Needs assistance Equipment used: Rolling walker (2 wheeled);1 person hand held assist Transfers: Sit to/from Stand Sit to Stand: Mod assist          General transfer comment: R lateral lean and RLE knee instability in the sagittal plane.   Ambulation/Gait Ambulation/Gait assistance: Mod assist;+2 physical assistance Ambulation Distance (Feet):  (bed to chair only; pt declines further attempts due to not feeling well today. ) Assistive device: 1 person hand held assist   Gait velocity: decreased   General Gait Details: physical assistance for pelvic weight shifting and RLE progression, knee block, etc.    Stairs            Wheelchair Mobility    Modified Rankin (Stroke Patients Only)       Balance Overall balance assessment: Needs assistance Sitting-balance support: Single extremity supported Sitting balance-Leahy Scale: Good       Standing balance-Leahy Scale: Poor                      Cognition Arousal/Alertness:  (mildly somnolent, frequent yawning while in room. ) Behavior During Therapy: Flat affect;WFL for tasks assessed/performed Overall Cognitive Status: Difficult to assess                      Exercises Other Exercises Other Exercises: Seated LAQ x15 on right; Seated ankle DF x15 on right;  Other Exercises: Standing lateral weight shift x15 bilat; requires knee block for stability Other Exercises: Standing A/P weight shift with assistance on pelvis, given cues ot L overhead reach and upwar gaze with pelvis forward.     General Comments        Pertinent Vitals/Pain Pain Assessment: No/denies pain  Home Living                      Prior Function            PT Goals (current goals can now be found in the care plan section) Acute Rehab PT Goals PT Goal Formulation: With patient/family Time For Goal Achievement: 12/26/15 Potential to Achieve Goals: Good Progress towards PT goals: PT to reassess next treatment    Frequency  7X/week    PT Plan Current plan remains appropriate    Co-evaluation             End of Session   Activity Tolerance: Patient  limited by lethargy;Patient tolerated treatment well Patient left: in chair;with call bell/phone within reach;with chair alarm set;with family/visitor present     Time: 1555-1611 PT Time Calculation (min) (ACUTE ONLY): 16 min  Charges:  $Therapeutic Activity: 8-22 mins                    G Codes:      Buccola,Allan C 12/15/2015, 4:27 PM  4:31 PM  Rosamaria LintsAllan C Buccola, PT, DPT [place] License # [id number]

## 2015-12-15 NOTE — Progress Notes (Signed)
Patient has been pleasant and accepting of nursing staff during the shift. Patient has been alert to care being received, verbalizes any needs to nursing staff. Left side of patient remains functional with limited use of right side. Patient has peritoneal dialysis in place during shift without any difficulties.

## 2015-12-15 NOTE — Progress Notes (Signed)
[facility] [place] [facility]   PATIENT NAME: Dorothy CaffeyDonnella Long    MR#:  [id number]  DATE OF BIRTH:  1961/03/15  SUBJECTIVE: She is admitted for altered mental status. Right-sided weakness found to have multiple infarcts on the left side. Patient is seen today. Awake alert oriented. Right-sided weakness is slightly better. Patient [facility].   Marland Kitchen.   CHIEF COMPLAINT:   Chief Complaint  Patient presents with  . Altered Mental Status   - Patient admitted secondary to altered mental status. Aphasic now, also has right-sided weakness. -At baseline, patient is very communicative, alert and ambulates with a walker  REVIEW OF SYSTEMS:  Review of Systems  Constitutional: Negative for fever and chills.  HENT: Negative for hearing loss.   Eyes: Negative for blurred vision, double vision and photophobia.  Respiratory: Negative for cough, hemoptysis and shortness of breath.   Cardiovascular: Negative for palpitations, orthopnea and leg swelling.  Gastrointestinal: Negative for vomiting, abdominal pain and diarrhea.  Genitourinary: Negative for dysuria and urgency.  Musculoskeletal: Negative for myalgias and neck pain.  Skin: Negative for rash.  Neurological: Positive for speech change and focal weakness. Negative for dizziness, seizures, weakness and headaches.       Right-sided hemiplegia, slurred speech noted.  Psychiatric/Behavioral: Negative for memory loss. The patient does not have insomnia.   Dysarthria  DRUG ALLERGIES:   Allergies  Allergen Reactions  . Ciprocinonide [Fluocinolone] Hives  . Demerol [Meperidine] Nausea And Vomiting  . Diflucan [Fluconazole] Nausea Only  . Flagyl [Metronidazole] Hives and Itching  . Hydrocodone Itching  . Levaquin [Levofloxacin In D5w] Other (See Comments)    Numbness in legs  . Morphine And Related Nausea And Vomiting  . Tetracyclines & Related Itching and Swelling  . Valium  [Diazepam] Nausea Only    hallucinations    VITALS:  Blood pressure 120/88, pulse 82, temperature 98.2 F (36.8 C), temperature source Oral, resp. rate 17, height 5\' 4"  (1.626 m), weight 50 kg (110 lb 3.7 oz), SpO2 98 %.  PHYSICAL EXAMINATION:  Physical Exam  Constitutional: She is oriented to person, place, and time and well-developed, well-nourished, and in no distress.  HENT:  Head: Normocephalic.  Right Ear: External ear normal.  Mouth/Throat: Oropharynx is clear and moist.  Eyes: Conjunctivae and EOM are normal. Pupils are equal, round, and reactive to light.  Neck: Neck supple. No tracheal deviation present. No thyromegaly present.  Cardiovascular: Normal rate, regular rhythm and normal heart sounds.  Exam reveals no gallop.   No murmur heard. Pulmonary/Chest: She has no wheezes. She has no rales.  Abdominal: She exhibits no distension. There is no tenderness. There is no rebound.  Musculoskeletal: Normal range of motion.  Lymphadenopathy:    She has no cervical adenopathy.  Neurological: She is alert and oriented to person, place, and time. Coordination normal.  Overall the right-sided weakness on the upper and lower extremities. It is 2/5.  Skin: Skin is dry.  Psychiatric: Memory and affect normal.      LABORATORY PANEL:   CBC  Recent Labs Lab 12/14/15 0726  WBC 4.8  HGB 8.9*  HCT 27.7*  PLT 126*   ------------------------------------------------------------------------------------------------------------------  Chemistries   Recent Labs Lab 12/10/15 2343  12/12/15 0340  NA 136  < > 135  K 3.2*  < > 3.5  CL 97*  < > 99*  CO2 22  < > 21*  GLUCOSE 128*  < > 93  BUN 63*  < >  62*  CREATININE 9.58*  < > 9.59*  CALCIUM 7.7*  < > 8.2*  AST 23  --   --   ALT 14  --   --   ALKPHOS 104  --   --   BILITOT 0.9  --   --   < > = values in this interval not  displayed. ------------------------------------------------------------------------------------------------------------------  Cardiac Enzymes  Recent Labs Lab 12/11/15 1502  TROPONINI 4.79*   ------------------------------------------------------------------------------------------------------------------  RADIOLOGY:  No results found.  EKG:   Orders placed or performed during the hospital encounter of 12/10/15  . ED EKG  . ED EKG  . EKG 12-Lead  . EKG 12-Lead  . EKG 12-Lead  . EKG 12-Lead    ASSESSMENT AND PLAN:   54 year old female with multiple medical problems including COPD, end-stage renal disease on peritoneal dialysis, hypertension, hyperlipidemia, congestive heart failure with systolic dysfunction, chronic anemia, history of atrial fibrillation, diabetes mellitus presents to the hospital secondary to change in mental status, speech and RIGHT-sided weakness.  #1 multifocal stroke: MRI of the brain showed a left MCA, PCA, MCA territories. Patient had a CT angiogram of the neck did not show any significant stenosis that needs to be operated. Continue aspirin, Plavix. Statins. . No lymphadenopathy. Patient needs outpatient 30 day loop moniotr evaluation of atrial fibrillation. Continue dysphagia 3 diet.  appreciate Vascular, and neurology evaluations. Physical therapy recommends inpatient rehabilitation. Case management is working on getting of bed at [facility]. [facility] want to see how patient progresses over the weekend.   -Most recent echo 3 weeks ago with no clot. - #2 elevated troponin-possibly demand ischemia. Patient denies any chest pain. -Has stroke and also an end-stage renal dialysis patient. -Continue to monitor troponin.  Cardiology is  following patient needs outpatient TEE . #3 history of paroxysmal atrial fibrillation with 17 beats of nonsustained V. tach on telemetry. -Appreciate cardiology consult. -Metoprolol discontinued for now due to acute stroke and  permissive hypertension requested Patient needs outpatient TEE.  #4  end-stage renal disease on peritoneal dialysis-also has hemodialysis catheter. Prior history of peritonitis and abdominal hernias. -Nephrology consulted..  Continue peritoneal dialysis at this time..  #5 anemia of chronic disease-stable. -With acute stroke, likely not a candidate for Procrit  #6 hypokalemia-per nephrology.  #7 tobacco use disorder-smoking cessation advised  # 8 DVT prophylaxis-on subcutaneous heparin Downgraded to regular stroke unit.  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  All the records are reviewed and case discussed with Care Management/Social Workerr. Management plans discussed with the patient, family and they are in agreement.  CODE STATUS: Full Code  TOTAL TIME SPENT IN TAKING CARE OF THIS PATIENT: 45 minutes.   POSSIBLE D/C IN 2-3 DAYS, DEPENDING ON CLINICAL CONDITION.   Katha Hamming M.D on 12/15/2015 at 10:57 AM  Between 7am to 6pm - Pager - [phone]  After 6pm go to [email or web address] - password [email or web address] [facility]  [facility]  Office  [phone]  CC: Primary care physician; Sherlene Shams, MD

## 2015-12-15 NOTE — Progress Notes (Signed)
Occupational Therapy Treatment Patient Details Name: Dorothy Long MRN: [id number] DOB: 15-Jan-1961 Today's Date: 12/15/2015    History of present illness presented to ER after being found on floor by husband, + LOC, noted with impaired speech and R-sided weakness upon awakening; admitted for acute CVA.  MRI significant for multifocal infarcts throughout L hemisphere.  Of note, patient with elevated troponin (peak 4.79), likely demand ischemia due to acute CVA per cardiology; no invasive work-up recommneded at this time.   OT comments  Noticed today that patient had very weak (2+/5) shoulder internal and external rotation which were not present yesterday. Repositioned R UE on pillow to elevate and re-instructed patient and family in edema control. (does not show signs of edema yet, this is prevention).  Follow Up Recommendations  CIR    Equipment Recommendations       Recommendations for Other Services      Precautions / Restrictions Precautions Precautions: Fall Precaution Comments: Seizure, L AVF (No BP L UE), Tenkoff catheter (PD) Restrictions Weight Bearing Restrictions: No       Mobility Bed Mobility                  Transfers                      Balance                                   ADL                                                Vision                     Perception     Praxis      Cognition   Behavior During Therapy: Flat affect;WFL for tasks assessed/performed Overall Cognitive Status: Within Functional Limits for tasks assessed Area of Impairment: Safety/judgement;Awareness;Problem solving          Safety/Judgement: Decreased awareness of safety   Problem Solving: Slow processing      Extremity/Trunk Assessment               Exercises  In supine stretched R upper extremity into shoulder flexion, external and internal rotation elbow flexion and extension, forearm  supination and pronation, wrist flexion and extension, and hand flexion and extension. Repeated these motions with active assist. Added eccentric elbow flexion. External and internal rotations now have 2+/5 strength which was not present yesterday. R upper extremity  shoulder flexion, external and internal rotation elbow flexion and extension, forearm supination and pronation, wrist flexion and extension, and hand flexion and extension now demonstrate 2+/5 strength. Cues needed for postioning and to carry out activity.   Shoulder Instructions       General Comments      Pertinent Vitals/ Pain       Pain Assessment: No/denies pain  Home Living                                          Prior Functioning/Environment              Frequency  Progress Toward Goals  OT Goals(current goals can now be found in the care plan section)        Plan      Co-evaluation                 End of Session     Activity Tolerance Patient limited by fatigue   Patient Left in bed;with call bell/phone within reach;with bed alarm set;with family/visitor present   Nurse Communication          Time: [id number] OT Time Calculation (min): 14 min  Charges: OT General Charges $OT Visit: 1 Procedure OT Treatments $Neuromuscular Re-education: 8-22 mins Ocie CornfieldJohn M Maelin Kurkowski, MS/OTR/L  Ocie CornfieldHuff, Dacian Orrico M 12/15/2015, 12:17 PM

## 2015-12-16 MED ORDER — DELFLEX-LC/1.5% DEXTROSE 346 MOSM/L IP SOLN
INTRAPERITONEAL | Status: DC
  Administered 2015-12-16: 7 L via INTRAPERITONEAL
  Administered 2015-12-17: 19:00:00 via INTRAPERITONEAL
  Filled 2015-12-16 (×4): qty 3000

## 2015-12-16 NOTE — Progress Notes (Signed)
[facility] [place] [facility]   PATIENT NAME: Dorothy CaffeyDonnella Long    MR#:  [id number]  DATE OF BIRTH:  1961-07-23  SUBJECTIVE: She is admitted for altered mental status. Right-sided weakness found to have multiple infarcts on the left side. Patient is seen today. Awake alert oriented. Right-sided weakness is slightly better. [facility]   Marland Kitchen.   CHIEF COMPLAINT:   Chief Complaint  Patient presents with  . Altered Mental Status   - Patient admitted secondary to altered mental status. Aphasic now, also has right-sided weakness. -At baseline, patient is very communicative, alert and ambulates with a walker  REVIEW OF SYSTEMS:  Review of Systems  Constitutional: Negative for fever and chills.  HENT: Negative for hearing loss.   Eyes: Negative for blurred vision, double vision and photophobia.  Respiratory: Negative for cough, hemoptysis and shortness of breath.   Cardiovascular: Negative for palpitations, orthopnea and leg swelling.  Gastrointestinal: Negative for vomiting, abdominal pain and diarrhea.  Genitourinary: Negative for dysuria and urgency.  Musculoskeletal: Negative for myalgias and neck pain.  Skin: Negative for rash.  Neurological: Positive for speech change and focal weakness. Negative for dizziness, seizures, weakness and headaches.       Right-sided hemiplegia, slurred speech noted.  Psychiatric/Behavioral: Negative for memory loss. The patient does not have insomnia.   Dysarthria  DRUG ALLERGIES:   Allergies  Allergen Reactions  . Ciprocinonide [Fluocinolone] Hives  . Demerol [Meperidine] Nausea And Vomiting  . Diflucan [Fluconazole] Nausea Only  . Flagyl [Metronidazole] Hives and Itching  . Hydrocodone Itching  . Levaquin [Levofloxacin In D5w] Other (See Comments)    Numbness in legs  . Morphine And Related Nausea And Vomiting  . Tetracyclines & Related Itching and Swelling  . Valium  [Diazepam] Nausea Only    hallucinations    VITALS:  Blood pressure 125/84, pulse 66, temperature 97.6 F (36.4 C), temperature source Oral, resp. rate 18, height 5\' 4"  (1.626 m), weight 51.755 kg (114 lb 1.6 oz), SpO2 100 %.  PHYSICAL EXAMINATION:  Physical Exam  Constitutional: She is oriented to person, place, and time and well-developed, well-nourished, and in no distress.  HENT:  Head: Normocephalic.  Right Ear: External ear normal.  Mouth/Throat: Oropharynx is clear and moist.  Eyes: Conjunctivae and EOM are normal. Pupils are equal, round, and reactive to light.  Neck: Neck supple. No tracheal deviation present. No thyromegaly present.  Cardiovascular: Normal rate, regular rhythm and normal heart sounds.  Exam reveals no gallop.   No murmur heard. Pulmonary/Chest: She has no wheezes. She has no rales.  Abdominal: She exhibits no distension. There is no tenderness. There is no rebound.  Musculoskeletal: Normal range of motion.  Lymphadenopathy:    She has no cervical adenopathy.  Neurological: She is alert and oriented to person, place, and time. Coordination normal.  Overall the right-sided weakness on the upper and lower extremities. It is 2/5.  Skin: Skin is dry.  Psychiatric: Memory and affect normal.      LABORATORY PANEL:   CBC  Recent Labs Lab 12/14/15 0726  WBC 4.8  HGB 8.9*  HCT 27.7*  PLT 126*   ------------------------------------------------------------------------------------------------------------------  Chemistries   Recent Labs Lab 12/10/15 2343  12/12/15 0340  NA 136  < > 135  K 3.2*  < > 3.5  CL 97*  < > 99*  CO2 22  < > 21*  GLUCOSE 128*  < > 93  BUN 63*  < >  62*  CREATININE 9.58*  < > 9.59*  CALCIUM 7.7*  < > 8.2*  AST 23  --   --   ALT 14  --   --   ALKPHOS 104  --   --   BILITOT 0.9  --   --   < > = values in this interval not  displayed. ------------------------------------------------------------------------------------------------------------------  Cardiac Enzymes  Recent Labs Lab 12/11/15 1502  TROPONINI 4.79*   ------------------------------------------------------------------------------------------------------------------  RADIOLOGY:  No results found.  EKG:   Orders placed or performed during the hospital encounter of 12/10/15  . ED EKG  . ED EKG  . EKG 12-Lead  . EKG 12-Lead  . EKG 12-Lead  . EKG 12-Lead    ASSESSMENT AND PLAN:   55 year old female with multiple medical problems including COPD, end-stage renal disease on peritoneal dialysis, hypertension, hyperlipidemia, congestive heart failure with systolic dysfunction, chronic anemia, history of atrial fibrillation, diabetes mellitus presents to the hospital secondary to change in mental status, speech and RIGHT-sided weakness.  #1 multifocal stroke: MRI of the brain showed a left MCA, PCA, MCA territories. Patient had a CT angiogram of the neck did not show any significant stenosis that needs to be operated. Continue aspirin, Plavix. Statins.  Patient needs outpatient 30 day loop moniotr evaluation of atrial fibrillation. Continue dysphagia 3 diet.  appreciate Vascular, and neurology evaluations. Physical therapy recommends inpatient rehabilitation. Case management is working on getting of bed at [facility]. [facility] want to see how patient progresses over the weekend.   -Most recent echo 3 weeks ago with no clot. - #2 elevated troponin-possibly demand ischemia. Patient denies any chest pain. - Cardiology is  following patient needs outpatient TEE . #3 history of paroxysmal atrial fibrillation with 17 beats of nonsustained V. tach on telemetry. -Appreciate cardiology consult. -Metoprolol discontinued for now due to acute stroke and permissive hypertension requested Patient needs outpatient TEE.  #4  end-stage renal disease on peritoneal  dialysis-also has hemodialysis catheter. Prior history of peritonitis and abdominal hernias. -Nephrology consulted..  Continue peritoneal dialysis at this time..  #5 anemia of chronic disease-stable. -With acute stroke, likely not a candidate for Procrit  #6 hypokalemia-per nephrology.  #7 tobacco use disorder-smoking cessation advised  # 8 DVT prophylaxis-on subcutaneous heparin Downgraded to regular stroke unit.  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  All the records are reviewed and case discussed with Care Management/Social Workerr. Management plans discussed with the patient, family and they are in agreement.  CODE STATUS: Full Code  TOTAL TIME SPENT IN TAKING CARE OF THIS PATIENT: 45 minutes.   POSSIBLE D/C IN 2-3 DAYS, DEPENDING ON CLINICAL CONDITION.   Katha Hamming M.D on 12/16/2015 at 11:40 AM  Between 7am to 6pm - Pager - [phone]  After 6pm go to [email or web address] - password [email or web address] [facility]  [place]  Hospitalists  Office  [phone]  CC: Primary care physician; Sherlene Shams, MD

## 2015-12-16 NOTE — Progress Notes (Signed)
[facility]  ROUNDING NOTE   Subjective:   Seen and examined on peritoneal dialysis. Tolerating treatment well. Exit site clean, some pinkish drainage of dialysate   Objective:  Vital signs in last 24 hours:  Temp:  [97.6 F (36.4 C)-98.5 F (36.9 C)] 97.6 F (36.4 C) (01/01 0450) Pulse Rate:  [66-81] 66 (01/01 0450) Resp:  [18-20] 18 (01/01 0450) BP: (116-125)/(79-87) 125/84 mmHg (01/01 0450) SpO2:  [98 %-100 %] 100 % (01/01 0450) Weight:  [51.755 kg (114 lb 1.6 oz)] 51.755 kg (114 lb 1.6 oz) (01/01 0853)  Weight change:  Filed Weights   12/13/15 1227 12/15/15 0700 12/16/15 0853  Weight: 49.261 kg (108 lb 9.6 oz) 50 kg (110 lb 3.7 oz) 51.755 kg (114 lb 1.6 oz)    Intake/Output: I/O last 3 completed shifts: In: 6503 [I.V.:3; Other:6500] Out: 7093 [Other:7093]   Intake/Output this shift:     Physical Exam: General: NAD, resting in bed,   Head: Normocephalic, atraumatic. Moist oral mucosal membranes  Eyes: Anicteric  Neck: Supple, trachea midline  Lungs:  Clear to auscultation normal effort  Heart: S1S2 no rubs  Abdomen:  Soft, nontender, BS present   Extremities: no peripheral edema.  Neurologic: Right upper extremity strength 3/5, right lower extremity 3/5  Skin: No lesions  Access: PD catheter in place    Basic Metabolic Panel:  Recent Labs Lab 12/10/15 2343 12/11/15 0452 12/12/15 0340 12/13/15 1257  NA 136 136 135  --   K 3.2* 3.5 3.5  --   CL 97* 100* 99*  --   CO2 22 22 21*  --   GLUCOSE 128* 107* 93  --   BUN 63* 65* 62*  --   CREATININE 9.58* 9.71* 9.59*  --   CALCIUM 7.7* 7.8* 8.2*  --   PHOS  --   --   --  9.3*    Liver Function Tests:  Recent Labs Lab 12/10/15 2343  AST 23  ALT 14  ALKPHOS 104  BILITOT 0.9  PROT 6.2*  ALBUMIN 3.0*   No results for input(s): LIPASE, AMYLASE in the last 168 hours. No results for input(s): AMMONIA in the last 168 hours.  CBC:  Recent Labs Lab 12/10/15 2343 12/11/15 0452  12/14/15 0726  WBC 6.3 6.0 4.8  NEUTROABS  --  4.9  --   HGB 9.2* 8.4* 8.9*  HCT 29.1* 25.8* 27.7*  MCV 88.2 86.2 86.7  PLT 150 147* 126*    Cardiac Enzymes:  Recent Labs Lab 12/10/15 2343 12/11/15 0045 12/11/15 0854 12/11/15 1502  TROPONINI 0.25* 0.64* 3.18* 4.79*    BNP: Invalid input(s): POCBNP  CBG: No results for input(s): GLUCAP in the last 168 hours.  Microbiology: Results for orders placed or performed during the hospital encounter of 12/10/15  MRSA PCR Screening     Status: None   Collection Time: 12/11/15  8:19 PM  Result Value Ref Range Status   MRSA by PCR NEGATIVE NEGATIVE Final    Comment:        The GeneXpert MRSA Assay (FDA approved for NASAL specimens only), is one component of a comprehensive MRSA colonization surveillance program. It is not intended to diagnose MRSA infection nor to guide or monitor treatment for MRSA infections.   Body fluid culture     Status: None (Preliminary result)   Collection Time: 12/15/15  9:00 AM  Result Value Ref Range Status   Specimen Description ABDOMEN  Final   Special Requests Immunocompromised  Final  Gram Stain NO WBC SEEN NO ORGANISMS SEEN   Final   Culture NO GROWTH < 24 HOURS  Final   Report Status PENDING  Incomplete    Coagulation Studies: No results for input(s): LABPROT, INR in the last 72 hours.  Urinalysis: No results for input(s): COLORURINE, LABSPEC, PHURINE, GLUCOSEU, HGBUR, BILIRUBINUR, KETONESUR, PROTEINUR, UROBILINOGEN, NITRITE, LEUKOCYTESUR in the last 72 hours.  Invalid input(s): APPERANCEUR    Imaging: No results found.   Medications:     . aspirin EC  81 mg Oral Daily  . atorvastatin  20 mg Oral Daily  . calcium acetate  667 mg Oral TID  . clopidogrel  75 mg Oral Daily  . dialysis solution 1.5% low-MG/low-CA   Intraperitoneal Q24H  . feeding supplement (ENSURE ENLIVE)  237 mL Oral Q24H  . gentamicin cream  1 application Topical Daily  . heparin  5,000 Units  Subcutaneous 3 times per day  . sevelamer carbonate  1,600 mg Oral TID WC  . sodium chloride  3 mL Intravenous Q12H  . tiotropium  18 mcg Inhalation Daily   acetaminophen **OR** acetaminophen, albuterol, ALPRAZolam, morphine injection, ondansetron (ZOFRAN) IV, senna-docusate  Assessment/ Plan:  55 y.o. female with PMHx of of hypertension, hyperlipidemia, vitamin D deficiency, tobacco abuse, edema, cholecystectomy,hysterectomy, ESRD secondary to FSGS, SHPTH, severe mitral stenosis, Liver cirrhosis, inguinal hernia repair.  ADmitted 12/10/15 with fall from home and found to have multiple acute areas of infarction in the left cerebral hemisphere.  1.  End-stage renal disease on peritoneal dialysis.  Continue current prescription of 1.5% dextrose solution.   - cell count is not indicative of infection.  - gentamicin to exit site  2.  Anemia chronic kidney disease.  Hemoglobin 8.9.   - avoiding Epogen given multiple areas of cerebral infarction.  3.  Secondary hyperparathyroidism.  Phos 9.3. - calcium acetate, sevelamer with meals  4.  Acute CVA.  Multiple areas of acute infarction involving the left hemisphere noted. - appreciate neurology input.  - Continue to work with PT/OT   5. Hypertension: will allow to rise due to recent ischemic event. Currently not on any agents. Blood pressure currently at goal.   LOS: 5 Dorlis Judice 1/1/20179:08 AM

## 2015-12-16 NOTE — Progress Notes (Signed)
Physical Therapy Treatment Patient Details Name: Romilda GarretDonnella R Geier MRN: [id number] DOB: 18-Aug-1961 Today's Date: 12/16/2015    History of Present Illness presented to ER after being found on floor by husband, + LOC, noted with impaired speech and R-sided weakness upon awakening; admitted for acute CVA.  MRI significant for multifocal infarcts throughout L hemisphere.  Of note, patient with elevated troponin (peak 4.79), likely demand ischemia due to acute CVA per cardiology; no invasive work-up recommneded at this time.    PT Comments    Pt is making improved progress towards goals this session and is motivated to participate. Improvement in R UE strength compared to R LE strength. Pt unable to pick R LE off ground from seated position. Would benefit from AFO to prevent toe drag during ambulation. Will need +2 for all mobility. Pt demonstrates improved functional sitting balance, able to participate in seated dynamic balance/ther-ex while at EOB without LOB noted. Still needs assist for maintaining balance when outside BOS with engagement in trunk musculature.  Follow Up Recommendations  CIR     Equipment Recommendations  None recommended by PT    Recommendations for Other Services       Precautions / Restrictions Precautions Precautions: Fall Precaution Comments: Seizure, L AVF (No BP L UE), Tenkoff catheter (PD) Restrictions Weight Bearing Restrictions: No    Mobility  Bed Mobility Overal bed mobility: Needs Assistance Bed Mobility: Supine to Sit;Sit to Supine     Supine to sit: Min assist Sit to supine: Min assist   General bed mobility comments: assist required for sequencing and instructions. Physical assist needed for scooting out towards EOB. Once at EOB, pt able to sit with supervision  Transfers Overall transfer level: Needs assistance Equipment used: 1 person hand held assist Transfers: Sit to/from Stand Sit to Stand: Mod assist         General transfer  comment: Assist for initiation of transfer. Once standing, therapist blocked R knee as it buckles with increased fatigue. Pt only able to stand 30 before needing to sit. Pt impulsive when sitting and just collapses. Cues given for safe transfer back to bed.  Ambulation/Gait             General Gait Details: not performed as pt sleepy and +2 assist not available this date. Not safe to ambulate   Stairs            Wheelchair Mobility    Modified Rankin (Stroke Patients Only)       Balance                                    Cognition Arousal/Alertness: Awake/alert Behavior During Therapy: Flat affect;WFL for tasks assessed/performed Overall Cognitive Status: Difficult to assess                      Exercises Other Exercises Other Exercises: Seated dynamic balance training using hospital tray including pushing with B UE in forward/backward direction x 10 reps. Pt cued to engage abdominal and lats when performing multiple directions. With increased reps, pt able to go outside BOS. Min assist required for completion of activity and L hand needed to assist R hand to stay on tray. Other Exercises: Seated ther-ex performed with R UE using tray table. Excises including performing diagnoal with reaching and grabbing remote. Mod/max assist required for sliding across tray, lifting hand, placing on remote, and sliding back.  Pt performed ther-ex in all 4 diagnoals with R hand Other Exercises: Seated/standing B LE ther-ex performed including R knee marching, standing weight shifting with R knee blocked, and attempted heel raises. Pt very hesistant to place weight on R side and lists to L side. All ther-ex performed x 10 reps.    General Comments        Pertinent Vitals/Pain Pain Assessment: No/denies pain    Home Living                      Prior Function            PT Goals (current goals can now be found in the care plan section) Acute Rehab  PT Goals Patient Stated Goal: OK PT Goal Formulation: With patient/family Time For Goal Achievement: 12/26/15 Potential to Achieve Goals: Good Progress towards PT goals: Progressing toward goals    Frequency  7X/week    PT Plan Current plan remains appropriate    Co-evaluation             End of Session Equipment Utilized During Treatment: Gait belt Activity Tolerance: Patient limited by fatigue Patient left: in bed;with bed alarm set;with family/visitor present     Time: 1431-1454 PT Time Calculation (min) (ACUTE ONLY): 23 min  Charges:  $Therapeutic Exercise: 23-37 mins                    G Codes:      Cardell Rachel 2015-12-20, 3:18 PM  Elizabeth Palau, PT, DPT [phone]

## 2015-12-16 NOTE — Plan of Care (Signed)
Problem: Safety: Goal: Ability to remain free from injury will improve Outcome: Progressing Patient remained in bed this shift.  Patient continues with right leg weakness and is unsteady.  Call bell and phone within reach and patient compliant with calling for assistance.  Bed alarm on throughout shift.  Problem: Pain Managment: Goal: General experience of comfort will improve Outcome: Progressing Patient with complaints of generalized pain throughout abdomen during peritoneal dialysis.  Morphine given x 1 this shift with relief upon reassessment.   Problem: Skin Integrity: Goal: Risk for impaired skin integrity will decrease Outcome: Progressing Skin intact.  Patient able to turn self.  Bed controls are self-regulating.  Problem: Tissue Perfusion: Goal: Risk factors for ineffective tissue perfusion will decrease Outcome: Progressing Patient received heparin injection x 2 this shift.

## 2015-12-17 MED ORDER — ENSURE ENLIVE PO LIQD
237.0000 mL | Freq: Two times a day (BID) | ORAL | Status: DC
  Administered 2015-12-17 – 2015-12-19 (×4): 237 mL via ORAL

## 2015-12-17 NOTE — Progress Notes (Signed)
Pre-peritoneal dialysis 

## 2015-12-17 NOTE — Progress Notes (Signed)
Nutrition Follow-up  INTERVENTION:   Meals and Snacks: Cater to patient preferences; RD called down for milkshake Medical Food Supplement Therapy: Continue Ensure as pt drinking some at times per report; will increase to BID   NUTRITION DIAGNOSIS:   Inadequate oral intake related to acute illness as evidenced by meal completion < 25%.  GOAL:   Patient will meet greater than or equal to 90% of their needs  MONITOR:    (Energy Intake, Electrolyte and renal Profile, Anthropometrics, Digestive system)  REASON FOR ASSESSMENT:    (Renal Diet Order)    ASSESSMENT:   Pt admitted with AMS secondary to acute CVA s/p MRI per MD note. Pt with h/o ESRD on PD; Nephrology following.  Per MD note [facility]; per Nsg hopeful transfer tomorrow.  Diet Order:  Diet regular Room service appropriate?: Yes; Fluid consistency:: Thin; Fluid restriction:: 1200 mL Fluid    Current Nutrition: Pt reports eating some waffle this am. Per RN Malka a few bites of waffle was eaten. Per RN Malka pt had sips of Ensure this am.    Gastrointestinal Profile: Last BM: 12/15/2015   Scheduled Medications:  . aspirin EC  81 mg Oral Daily  . atorvastatin  20 mg Oral Daily  . calcium acetate  667 mg Oral TID  . clopidogrel  75 mg Oral Daily  . dialysis solution 1.5% low-MG/low-CA   Intraperitoneal Q24H  . feeding supplement (ENSURE ENLIVE)  237 mL Oral Q24H  . gentamicin cream  1 application Topical Daily  . heparin  5,000 Units Subcutaneous 3 times per day  . sevelamer carbonate  1,600 mg Oral TID WC  . sodium chloride  3 mL Intravenous Q12H  . tiotropium  18 mcg Inhalation Daily     Electrolyte/Renal Profile and Glucose Profile:   Recent Labs Lab 12/10/15 2343 12/11/15 0452 12/12/15 0340 12/13/15 1257  NA 136 136 135  --   K 3.2* 3.5 3.5  --   CL 97* 100* 99*  --   CO2 22 22 21*  --   BUN 63* 65* 62*  --   CREATININE 9.58* 9.71* 9.59*  --   CALCIUM 7.7*  7.8* 8.2*  --   PHOS  --   --   --  9.3*  GLUCOSE 128* 107* 93  --    Protein Profile:  Recent Labs Lab 12/10/15 2343  ALBUMIN 3.0*     Weight Trend since Admission: Filed Weights   12/16/15 0853 12/17/15 0500 12/17/15 0930  Weight: 114 lb 1.6 oz (51.755 kg) 114 lb 1.6 oz (51.755 kg) 103 lb 4.8 oz (46.857 kg)   Noted weight decrease s/p HD this am.    BMI:  Body mass index is 17.72 kg/(m^2).  Estimated Nutritional Needs:   Kcal:  BEE: 1093kcals, TEE: (IF 1.2-1.4)(AF 1.3) 1705-1990kcals   Protein:  60-75g protein (1.2-1.5g/kg)   Fluid:  UOP+2L  EDUCATION NEEDS:   No education needs identified at this time   HIGH Care Level  Leda QuailAllyson Joelene Barriere, RD, LDN Pager [phone] Weekend/On-Call Pager [phone]

## 2015-12-17 NOTE — Plan of Care (Signed)
Problem: Safety: Goal: Ability to remain free from injury will improve Outcome: Progressing Pt remains free of injury during the shift.  Problem: Physical Regulation: Goal: Ability to maintain clinical measurements within normal limits will improve Outcome: Progressing VSS. NIH score remains 7. No neuro changes during the shift. Possible discharge tomorrow to inpatient acute rehab. Denies pain.  Problem: Nutrition: Goal: Dietary intake will improve Outcome: Progressing Poor appetite. Pt encouraged to eat. Spouse verbalized that pt ate some food from out of facility.   Problem: Tissue Perfusion: Goal: Cerebral tissue perfusion will improve Outcome: Progressing Heparin continues.

## 2015-12-17 NOTE — Progress Notes (Signed)
Speech Therapy Note: reviewed chart notes; consulted NSG and met w/ pt and family. Pt was resting w/ eyes closed and only opened them briefly to address SLP. Husband spoke for pt and stated she had been communicative even "cutting up" w/ family members as they have come to visit in the past 2 days. He stated she had trouble getting out something she wanted to say "here and there". He encourages her to take her time and slow down. Endorsed this as well as to take a deep breath to relax then start again if nec. Husband agreed. A language screening was not performed this PM d/t pt resting and not seeming to want to engage in conversation at the time of the visit. Discussed above w/ CM and rec'd f/u w/ a formal speech-language assessment at [facility]. Will f/u w/ MD tomorrow. NSG updated.

## 2015-12-17 NOTE — Progress Notes (Signed)
[facility]  ROUNDING NOTE   Subjective:   Tolerating peritoneal treatment well. UF 730 Exit site clean, some pinkish drainage of dialysate   Objective:  Vital signs in last 24 hours:  Temp:  [98 F (36.7 C)-98.8 F (37.1 C)] 98 F (36.7 C) (01/02 0821) Pulse Rate:  [65-79] 71 (01/02 0917) Resp:  [18-20] 18 (01/02 0917) BP: (114-125)/(78-90) 125/86 mmHg (01/02 0917) SpO2:  [93 %-100 %] 100 % (01/02 0917) Weight:  [51.755 kg (114 lb 1.6 oz)] 51.755 kg (114 lb 1.6 oz) (01/02 0500)  Weight change:  Filed Weights   12/15/15 0700 12/16/15 0853 12/17/15 0500  Weight: 50 kg (110 lb 3.7 oz) 51.755 kg (114 lb 1.6 oz) 51.755 kg (114 lb 1.6 oz)    Intake/Output: I/O last 3 completed shifts: In: 6974 [P.O.:474; Other:6500] Out: 7093 [Other:7093]   Intake/Output this shift:     Physical Exam: General: NAD, sitting in bed  Head: Normocephalic, atraumatic. Moist oral mucosal membranes  Eyes: Anicteric  Neck: Supple, trachea midline  Lungs:  Clear to auscultation normal effort  Heart: S1S2 no rubs  Abdomen:  Soft, nontender, BS present   Extremities: no peripheral edema.  Neurologic: Right upper extremity strength 3/5, right lower extremity 3/5  Skin: No lesions  Access: PD catheter in place    Basic Metabolic Panel:  Recent Labs Lab 12/10/15 2343 12/11/15 0452 12/12/15 0340 12/13/15 1257  NA 136 136 135  --   K 3.2* 3.5 3.5  --   CL 97* 100* 99*  --   CO2 22 22 21*  --   GLUCOSE 128* 107* 93  --   BUN 63* 65* 62*  --   CREATININE 9.58* 9.71* 9.59*  --   CALCIUM 7.7* 7.8* 8.2*  --   PHOS  --   --   --  9.3*    Liver Function Tests:  Recent Labs Lab 12/10/15 2343  AST 23  ALT 14  ALKPHOS 104  BILITOT 0.9  PROT 6.2*  ALBUMIN 3.0*   No results for input(s): LIPASE, AMYLASE in the last 168 hours. No results for input(s): AMMONIA in the last 168 hours.  CBC:  Recent Labs Lab 12/10/15 2343 12/11/15 0452 12/14/15 0726  WBC 6.3 6.0 4.8   NEUTROABS  --  4.9  --   HGB 9.2* 8.4* 8.9*  HCT 29.1* 25.8* 27.7*  MCV 88.2 86.2 86.7  PLT 150 147* 126*    Cardiac Enzymes:  Recent Labs Lab 12/10/15 2343 12/11/15 0045 12/11/15 0854 12/11/15 1502  TROPONINI 0.25* 0.64* 3.18* 4.79*    BNP: Invalid input(s): POCBNP  CBG: No results for input(s): GLUCAP in the last 168 hours.  Microbiology: Results for orders placed or performed during the hospital encounter of 12/10/15  MRSA PCR Screening     Status: None   Collection Time: 12/11/15  8:19 PM  Result Value Ref Range Status   MRSA by PCR NEGATIVE NEGATIVE Final    Comment:        The GeneXpert MRSA Assay (FDA approved for NASAL specimens only), is one component of a comprehensive MRSA colonization surveillance program. It is not intended to diagnose MRSA infection nor to guide or monitor treatment for MRSA infections.   Body fluid culture     Status: None (Preliminary result)   Collection Time: 12/15/15  9:00 AM  Result Value Ref Range Status   Specimen Description ABDOMEN  Final   Special Requests Immunocompromised  Final   Gram Stain NO  WBC SEEN NO ORGANISMS SEEN   Final   Culture NO GROWTH 2 DAYS  Final   Report Status PENDING  Incomplete    Coagulation Studies: No results for input(s): LABPROT, INR in the last 72 hours.  Urinalysis: No results for input(s): COLORURINE, LABSPEC, PHURINE, GLUCOSEU, HGBUR, BILIRUBINUR, KETONESUR, PROTEINUR, UROBILINOGEN, NITRITE, LEUKOCYTESUR in the last 72 hours.  Invalid input(s): APPERANCEUR    Imaging: No results found.   Medications:     . aspirin EC  81 mg Oral Daily  . atorvastatin  20 mg Oral Daily  . calcium acetate  667 mg Oral TID  . clopidogrel  75 mg Oral Daily  . dialysis solution 1.5% low-MG/low-CA   Intraperitoneal Q24H  . feeding supplement (ENSURE ENLIVE)  237 mL Oral Q24H  . gentamicin cream  1 application Topical Daily  . heparin  5,000 Units Subcutaneous 3 times per day  . sevelamer  carbonate  1,600 mg Oral TID WC  . sodium chloride  3 mL Intravenous Q12H  . tiotropium  18 mcg Inhalation Daily   acetaminophen **OR** acetaminophen, albuterol, ALPRAZolam, morphine injection, ondansetron (ZOFRAN) IV, senna-docusate  Assessment/ Plan:  55 y.o. female with PMHx of of hypertension, hyperlipidemia, vitamin D deficiency, tobacco abuse, edema, cholecystectomy,hysterectomy, ESRD secondary to FSGS, SHPTH, severe mitral stenosis, Liver cirrhosis, inguinal hernia repair.  ADmitted 12/10/15 with fall from home and found to have multiple acute areas of infarction in the left cerebral hemisphere.  1.  End-stage renal disease on peritoneal dialysis.  Continue current prescription of 1.5% dextrose solution.   - cell count is not indicative of infection.  - gentamicin to exit site  2.  Anemia chronic kidney disease.  Hemoglobin 8.9.   - avoiding Epogen given multiple areas of cerebral infarction.  3.  Secondary hyperparathyroidism.  Phos 9.3. - calcium acetate, sevelamer with meals  4.  Acute CVA.  Multiple areas of acute infarction involving the left hemisphere noted. - appreciate neurology input.  - Continue to work with PT/OT   5. Hypertension: will allow to rise due to recent ischemic event. Currently not on any agents. Blood pressure currently at goal.   LOS: 6 Reis [place] 1/2/20179:44 AM

## 2015-12-17 NOTE — Progress Notes (Signed)
[facility] - [place] at [facility]   PATIENT NAME: Dorothy Long    MR#:  [id number]  DATE OF BIRTH:  07-Nov-1961  SUBJECTIVE: She is admitted for altered mental status. Right-sided weakness found to have multiple infarcts on the left side. Patient is seen today. Awake alert oriented. Right-sided weakness is slightly better. Patient is waiting for inpatient rehabilitation at [facility].   Marland Kitchen   CHIEF COMPLAINT:   Chief Complaint  Patient presents with  . Altered Mental Status   - Patient admitted secondary to altered mental status. Aphasic now, also has right-sided weakness. -At baseline, patient is very communicative, alert and ambulates with a walker  REVIEW OF SYSTEMS:  Review of Systems  Constitutional: Negative for fever and chills.  HENT: Negative for hearing loss.   Eyes: Negative for blurred vision, double vision and photophobia.  Respiratory: Negative for cough, hemoptysis and shortness of breath.   Cardiovascular: Negative for palpitations, orthopnea and leg swelling.  Gastrointestinal: Negative for vomiting, abdominal pain and diarrhea.  Genitourinary: Negative for dysuria and urgency.  Musculoskeletal: Negative for myalgias and neck pain.  Skin: Negative for rash.  Neurological: Positive for speech change and focal weakness. Negative for dizziness, seizures, weakness and headaches.       Right-sided hemiplegia, slurred speech noted.  Psychiatric/Behavioral: Negative for memory loss. The patient does not have insomnia.   Dysarthria  DRUG ALLERGIES:   Allergies  Allergen Reactions  . Ciprocinonide [Fluocinolone] Hives  . Demerol [Meperidine] Nausea And Vomiting  . Diflucan [Fluconazole] Nausea Only  . Flagyl [Metronidazole] Hives and Itching  . Hydrocodone Itching  . Levaquin [Levofloxacin In D5w] Other (See Comments)    Numbness in legs  . Morphine And Related Nausea And Vomiting  . Tetracyclines & Related Itching and Swelling  . Valium  [Diazepam] Nausea Only    hallucinations    VITALS:  Blood pressure 125/86, pulse 71, temperature 98 F (36.7 C), temperature source Oral, resp. rate 18, height 5\' 4"  (1.626 m), weight 46.857 kg (103 lb 4.8 oz), SpO2 100 %.  PHYSICAL EXAMINATION:  Physical Exam  Constitutional: She is oriented to person, place, and time and well-developed, well-nourished, and in no distress.  HENT:  Head: Normocephalic.  Right Ear: External ear normal.  Mouth/Throat: Oropharynx is clear and moist.  Eyes: Conjunctivae and EOM are normal. Pupils are equal, round, and reactive to light.  Neck: Neck supple. No tracheal deviation present. No thyromegaly present.  Cardiovascular: Normal rate, regular rhythm and normal heart sounds.  Exam reveals no gallop.   No murmur heard. Pulmonary/Chest: She has no wheezes. She has no rales.  Abdominal: She exhibits no distension. There is no tenderness. There is no rebound.  Musculoskeletal: Normal range of motion.  Lymphadenopathy:    She has no cervical adenopathy.  Neurological: She is alert and oriented to person, place, and time. Coordination normal.  Overall the right-sided weakness on the upper and lower extremities. It is 2/5.  Skin: Skin is dry.  Psychiatric: Memory and affect normal.      LABORATORY PANEL:   CBC  Recent Labs Lab 12/14/15 0726  WBC 4.8  HGB 8.9*  HCT 27.7*  PLT 126*   ------------------------------------------------------------------------------------------------------------------  Chemistries   Recent Labs Lab 12/10/15 2343  12/12/15 0340  NA 136  < > 135  K 3.2*  < > 3.5  CL 97*  < > 99*  CO2 22  < > 21*  GLUCOSE 128*  < > 93  BUN 63*  < >  62*  CREATININE 9.58*  < > 9.59*  CALCIUM 7.7*  < > 8.2*  AST 23  --   --   ALT 14  --   --   ALKPHOS 104  --   --   BILITOT 0.9  --   --   < > = values in this interval not  displayed. ------------------------------------------------------------------------------------------------------------------  Cardiac Enzymes  Recent Labs Lab 12/11/15 1502  TROPONINI 4.79*   ------------------------------------------------------------------------------------------------------------------  RADIOLOGY:  No results found.  EKG:   Orders placed or performed during the hospital encounter of 12/10/15  . ED EKG  . ED EKG  . EKG 12-Lead  . EKG 12-Lead  . EKG 12-Lead  . EKG 12-Lead    ASSESSMENT AND PLAN:   55 year old female with multiple medical problems including COPD, end-stage renal disease on peritoneal dialysis, hypertension, hyperlipidemia, congestive heart failure with systolic dysfunction, chronic anemia, history of atrial fibrillation, diabetes mellitus presents to the hospital secondary to change in mental status, speech and RIGHT-sided weakness.  #1 multifocal stroke: MRI of the brain showed a left MCA, PCA, MCA territories. Patient had a CT angiogram of the neck did not show any significant stenosis that needs to be operated. Continue aspirin, Plavix. Statins.  Patient needs outpatient 30 day loop moniotr evaluation of atrial fibrillation. Continue dysphagia 3 diet.  appreciate Vascular, and neurology evaluations. Physical therapy recommends inpatient rehabilitation. Case management [facility] want to see how patient progresses over the weekend.   -Most recent echo 3 weeks ago with no clot. - #2 elevated troponin-possibly demand ischemia. Patient denies any chest pain. - Cardiology is  following patient needs outpatient TEE . #3 history of paroxysmal atrial fibrillation with 17 beats of nonsustained V. tach on telemetry. -Appreciate cardiology consult. -Metoprolol discontinued for now due to acute stroke and permissive hypertension requested Patient needs outpatient TEE.  #4  end-stage renal disease on peritoneal  dialysis-also has hemodialysis catheter. Prior history of peritonitis and abdominal hernias. -Nephrology consulted..  Continue peritoneal dialysis at this time..  #5 anemia of chronic disease-stable. -With acute stroke, likely not a candidate for Procrit  #6 hypokalemia-per nephrology.  #7 tobacco use disorder-smoking cessation advised  # 8 DVT prophylaxis-on subcutaneous heparin Downgraded to regular stroke unit.  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  All the records are reviewed and case discussed with Care Management/Social Workerr. Management plans discussed with the patient, family and they are in agreement.  CODE STATUS: Full Code  TOTAL TIME SPENT IN TAKING CARE OF THIS PATIENT: 45 minutes.   POSSIBLE D/C IN 2-3 DAYS, DEPENDING ON CLINICAL CONDITION.   Katha HammingKONIDENA,Tayloranne Lekas M.D on 12/17/2015 at 12:02 PM  Between 7am to 6pm - Pager - [phone]  After 6pm go to [email or web address] - password [email or web address] [email or web address]  [facility]  Office  [phone]  CC: Primary care physician; Sherlene ShamsULLO, TERESA L, MD

## 2015-12-17 NOTE — Care Management (Signed)
Updated occupational therapy, physical therapy, nephology, and physician's progress noted faxed to [facility] Re[facility] Gwenette GreetBrenda S Tavita Eastham RN MSN CCM Care Management [phone]

## 2015-12-17 NOTE — Progress Notes (Signed)
Physical Therapy Treatment Patient Details Name: Dorothy Long: [id number] DOB: April 07, 1961 Today's Date: 12/17/2015    History of Present Illness presented to ER after being found on floor by husband, + LOC, noted with impaired speech and R-sided weakness upon awakening; admitted for acute CVA.  MRI significant for multifocal infarcts throughout L hemisphere.  Of note, patient with elevated troponin (peak 4.79), likely demand ischemia due to acute CVA per cardiology; no invasive work-up recommneded at this time.    PT Comments    Patient requiring increased encouragement for participation with all activities, but displays good effort with tasks despite.  Continues to require mod/max assist +2 for all mobility efforts with limited active use of R hemi-body.  May be excellent candidate for constraint-induced therapy to R UE and neuromuscular stimulation to R LE to promote improved active control and overall mechanics.   Follow Up Recommendations  CIR     Equipment Recommendations       Recommendations for Other Services       Precautions / Restrictions Precautions Precautions: Fall Precaution Comments: Seizure, L AVF (No BP L UE), Tenkoff catheter (PD) Restrictions Weight Bearing Restrictions: No    Mobility  Bed Mobility Overal bed mobility: Needs Assistance Bed Mobility: Supine to Sit     Supine to sit: Min assist Sit to supine: Min assist   General bed mobility comments: assist for position, protection and movement of R hemi-body.  Once upright, maintains sitting balance with close sup, fair/good symmetry  Transfers Overall transfer level: Needs assistance Equipment used: 1 person hand held assist Transfers: Sit to/from Stand Sit to Stand: Mod assist         General transfer comment: excessive weight shift to L LE with all movement transitions; assist for R hip/knee extension and neutral pelvic rotation in stance.  Cuing for R UE position prior to sitting  (tends to leave on table surface anterior to patient)  Ambulation/Gait Ambulation/Gait assistance: Mod assist;+2 physical assistance;Max assist Ambulation Distance (Feet): 25 Feet Assistive device: 2 person hand held assist     Gait velocity interpretation: <1.8 ft/sec, indicative of risk for recurrent falls General Gait Details: reciprocal stepping pattern wtih manual assist from therapist for R LE advancement and position.  Patient able to initiate limb advancement, but with inadequate foot clearance, step height/length.  Tendancy towards R pelvic retraction, R knee recurvatum and R ankle PF in loading phases; facilitation from thearpist throughout gait cycle for neutral positioning   Stairs            Wheelchair Mobility    Modified Rankin (Stroke Patients Only)       Balance Overall balance assessment: Needs assistance Sitting-balance support: No upper extremity supported;Feet supported Sitting balance-Leahy Scale: Good     Standing balance support: Bilateral upper extremity supported Standing balance-Leahy Scale: Poor Standing balance comment: initiates some delayed righting reactions with weight shift outside immdiate BOS, but requires continuous support from therapist with all standing activities                    Cognition Arousal/Alertness: Awake/alert Behavior During Therapy: WFL for tasks assessed/performed;Flat affect                   General Comments: expressive > receptive aphasia; consistently follows simple commands with increased encouragement    Exercises Other Exercises Other Exercises: Standing balance/LE therex, 1x10, bilat UEs supported on elevated tray table anterior to patient: mini squats, L LE hip abduct/adduct and  taps onto 2" surface.  All completed with emphasis on R LE WBing, activation and stability in periods of closed-chain activity.  Total assist for R hip/knee extension in loading; decreased ability to initiate R TKE in  stance phase this date. Other Exercises: Patient with inconsistent use of R UE--constant cuing for active effort from R UE prior to act assist by L UE with all functional activities.  May be excellent candidate for constraint-induced therapy at next venue of care.    General Comments        Pertinent Vitals/Pain Pain Assessment: No/denies pain    Home Living                      Prior Function            PT Goals (current goals can now be found in the care plan section) Acute Rehab PT Goals Patient Stated Goal: OK PT Goal Formulation: With patient/family Time For Goal Achievement: 12/26/15 Potential to Achieve Goals: Good Progress towards PT goals: Progressing toward goals    Frequency  7X/week    PT Plan Current plan remains appropriate    Co-evaluation             End of Session Equipment Utilized During Treatment: Gait belt Activity Tolerance: Patient tolerated treatment well Patient left: in bed;with bed alarm set;with family/visitor present;with call bell/phone within reach     Time: 0937-1010 PT Time Calculation (min) (ACUTE ONLY): 33 min  Charges:  $Gait Training: 8-22 mins $Neuromuscular Re-education: 8-22 mins                    G Codes:      Dorothy Long H. Manson Passey, PT, DPT, NCS 12/17/2015, 10:35 AM [phone]

## 2015-12-17 NOTE — Progress Notes (Signed)
Occupational Therapy Treatment Patient Details Name: Dorothy Long MRN: [id number] DOB: [birth year] Today's Date: 12/17/2015    History of present illness presented to ER after being found on floor by husband, + LOC, noted with impaired speech and R-sided weakness upon awakening; admitted for acute CVA.  MRI significant for multifocal infarcts throughout L hemisphere.  Of note, patient with elevated troponin (peak 4.79), likely demand ischemia due to acute CVA per cardiology; no invasive work-up recommneded at this time.   OT comments  Completed R upper extremity active assistive exercises shoulder flexion, external and internal rotation elbow flexion and extension, forearm supination and pronation, wrist flexion and extension, and hand flexion and extension 5 repetitions. Movements still very weak. Shoulder external and internal rotation patient unable to participate today. Completed grasp release activities with comb. Attempted opposition activities, can only oppose index finger. Cues provided for positioning and technique.  Follow Up Recommendations  CIR    Equipment Recommendations       Recommendations for Other Services      Precautions / Restrictions Precautions Precautions: Fall Precaution Comments: Seizure, L AVF (No BP L UE), Tenkoff catheter (PD) Restrictions Weight Bearing Restrictions: No       Mobility Bed Mobility Overal bed mobility: Needs Assistance Bed Mobility: Supine to Sit     Supine to sit: Min assist Sit to supine: Min assist   General bed mobility comments: assist for position, protection and movement of R hemi-body.  Once upright, maintains sitting balance with close sup, fair/good symmetry  Transfers Overall transfer level: Needs assistance Equipment used: 1 person hand held assist Transfers: Sit to/from Stand Sit to Stand: Mod assist         General transfer comment: excessive weight shift to L LE with all movement transitions; assist for R  hip/knee extension and neutral pelvic rotation in stance.  Cuing for R UE position prior to sitting (tends to leave on table surface anterior to patient)    Balance Overall balance assessment: Needs assistance Sitting-balance support: No upper extremity supported;Feet supported Sitting balance-Leahy Scale: Good     Standing balance support: Bilateral upper extremity supported Standing balance-Leahy Scale: Poor Standing balance comment: initiates some delayed righting reactions with weight shift outside immdiate BOS, but requires continuous support from therapist with all standing activities                   ADL                                                Vision                     Perception     Praxis      Cognition   Behavior During Therapy: Flat affect   Area of Impairment: Safety/judgement;Awareness;Problem solving          Safety/Judgement: Decreased awareness of safety   Problem Solving: Slow processing General Comments: expressive > receptive aphasia; consistently follows simple commands with increased encouragement    Extremity/Trunk Assessment               Exercises Other Exercises Other Exercises: Standing balance/LE therex, 1x10, bilat UEs supported on elevated tray table anterior to patient: mini squats, L LE hip abduct/adduct and taps onto 2" surface.  All completed with emphasis on R LE WBing, activation and  stability in periods of closed-chain activity.  Total assist for R hip/knee extension in loading; decreased ability to initiate R TKE in stance phase this date. Other Exercises: Patient with inconsistent use of R UE--constant cuing for active effort from R UE prior to act assist by L UE with all functional activities.  May be excellent candidate for constraint-induced therapy at next venue of care.   Shoulder Instructions       General Comments      Pertinent Vitals/ Pain       Pain Assessment:  No/denies pain  Home Living                                          Prior Functioning/Environment              Frequency       Progress Toward Goals  OT Goals(current goals can now be found in the care plan section)     Acute Rehab OT Goals Patient Stated Goal: OK  Plan      Co-evaluation                 End of Session     Activity Tolerance Patient limited by fatigue   Patient Left in bed;with call bell/phone within reach;with bed alarm set (family member coming in at end of session)   Nurse Communication          Time: 4098-1191: 1039-1052 OT Time Calculation (min): 13 min  Charges: OT General Charges $OT Visit: 1 Procedure OT Treatments $Neuromuscular Re-education: 8-22 mins  Elyn PeersHuff, Sydna Brodowski M 12/17/2015, 10:57 AM

## 2015-12-18 ENCOUNTER — Inpatient Hospital Stay: Payer: BC Managed Care – PPO

## 2015-12-18 LAB — PATHOLOGIST SMEAR REVIEW

## 2015-12-18 MED ORDER — CLOPIDOGREL BISULFATE 75 MG PO TABS: 75.0000 mg | ORAL_TABLET | Freq: Every day | ORAL | Status: DC

## 2015-12-18 MED ORDER — ENSURE ENLIVE PO LIQD: 237.0000 mL | Freq: Two times a day (BID) | ORAL | Status: DC

## 2015-12-18 NOTE — Progress Notes (Signed)
PT Cancellation Note  Patient Details Name: Dorothy Long MRN: [id number] DOB: [birth year]   Cancelled Treatment:    Reason Eval/Treat Not Completed: Patient declined, no reason specified. Treatment attempted; pt refuses noting she is too tired today to do anything. Pt request to wait until tomorrow. Re attempt treatment tomorrow.   Elsie StainHeidi Elizabeth Bishop 12/18/2015, 4:12 PM

## 2015-12-18 NOTE — Progress Notes (Signed)
Problem: Safety: Goal: Ability to remain free from injury will improve Outcome: Progressing Pt remains free of injury during the shift.  Fall precautions in place.  Problem: Physical Regulation: Goal: Ability to maintain clinical measurements within normal limits will improve Outcome: Progressing VSS. NIH score remains 7. No neuro changes during the shift. Possible discharge tomorrow to inpatient acute rehab. Denies pain.

## 2015-12-18 NOTE — Progress Notes (Signed)
Anticoag Monitoring Patient is a 55yo female ordered Heparin 5000 units SQ q8h for DVT prevention.  12/30 platelet count of 126  Per policy, cbc monitoring required at least every 3 days while on prophylactic Heparin. Will order CBC for 1/4 am labs.  Clovia CuffLisa Kersten Salmons, PharmD, BCPS 12/18/2015 1:47 PM

## 2015-12-18 NOTE — Plan of Care (Signed)
Problem: Education: Goal: Knowledge of disease or condition will improve Outcome: Progressing Pt has stroke info at hand.  Questions answered.  Problem: Coping: Goal: Ability to verbalize positive feelings about self will improve Outcome: Progressing Pt gets frustrated at times with speech, but reminding her to slow down helps.

## 2015-12-18 NOTE — Progress Notes (Signed)
PD tx ended. Pt disconnected from cycler, and capped. Vitals stable. Temp 98.4, B/P 121/59, HR 74 Resp 18. Denies any discomforts. Effluent clear and yellow, no fibrin noted. UF .  DSG c/d/i.

## 2015-12-18 NOTE — Plan of Care (Signed)
Problem: Safety: Goal: Ability to remain free from injury will improve Outcome: Progressing Free from injury  Problem: Pain Managment: Goal: General experience of comfort will improve Outcome: Progressing abd pain this pm and pt given iv med x1 with improvement. Pt says she felt better this pm.  Problem: Tissue Perfusion: Goal: Risk factors for ineffective tissue perfusion will decrease Outcome: Progressing Pd started around 1700 this pm tol well.  Problem: Fluid Volume: Goal: Ability to maintain a balanced intake and output will improve Outcome: Progressing 1200 fld restriction cont.  Problem: Nutrition: Goal: Adequate nutrition will be maintained Outcome: Progressing Outside food brought to pt tol well.

## 2015-12-18 NOTE — Care Management (Signed)
[facility]. [phone]). States she will have their physician review updated information and give this care manager a call back with disposition. Gwenette GreetBrenda S Kennetta Pavlovic RN MSN CCM Care Management [phone]

## 2015-12-18 NOTE — Progress Notes (Signed)
ccu  Called and reported a  Run of v tach on pt.  Went to room and replaced 2  Pads on pts moniter. Called  Tele  And  Pt was showing a normal  [facility].  Dr Casper Harrisonkonedina notified of   V tach run.  Will cont to monitor.

## 2015-12-18 NOTE — Care Management (Signed)
Spoke with Inpatient Acute Rehabilitation representative. Waiting for [employer] approval prior to admission to their facility. Husband updated. Gwenette GreetBrenda S Martavion Couper RN MSN CCM Care Management [phone]

## 2015-12-18 NOTE — Discharge Summary (Signed)
Dorothy GarretDonnella R Long, is a 55 y.o. female  DOB 1961-12-05  MRN [id number].  Admission date:  12/10/2015  Admitting Physician  Ihor AustinPavan Pyreddy, MD  Discharge Date:  12/18/2015   Primary MD  Sherlene ShamsULLO, TERESA L, MD  Recommendations for primary care physician for things to follow:  Follow-up with Dr. Dossie Arbourim Gollan in 2 weeks.     Admission Diagnosis  Aphasia [R47.01] Confusion [R41.0] Elevated troponin [R79.89]   Discharge Diagnosis  Aphasia [R47.01] Confusion [R41.0] Elevated troponin [R79.89]   Principal Problem:   CVA (cerebral infarction) Active Problems:   Dysarthria   ESRD (end stage renal disease) on dialysis (HCC)   TIA (transient ischemic attack)   Acute CVA (cerebrovascular accident) (HCC)   NSTEMI (non-ST elevated myocardial infarction) (HCC)   Aphasia   Confusion   Elevated troponin   Mitral valve stenosis   Congestive dilated cardiomyopathy (HCC)   Carotid stenosis      Past Medical History  Diagnosis Date  . Hypertension   . Hyperlipidemia   . COPD (chronic obstructive pulmonary disease) (HCC)     a. not on home O2  . Anemia   . Valvular disease     a. 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 echo 2015: EF 45-50%, borderline LVH, mod dilated LA, mildly dilated RA, small pericardial effusion, mod MR (rheumatic deformity), MS mild-mod, no gradient, PASP ele  . Chronic combined systolic and diastolic CHF (congestive heart failure) (HCC)     a. echo reports above  . PAF (paroxysmal atrial fibrillation) (HCC)     a. in setting of diarrhea 09/21/2013; b. not on long term full dose anticoagulation; c. CHADSVASC at least 4 (CHF, HTN, DM,  female)  . Diabetes mellitus (HCC)   . Peritoneal dialysis status (HCC) 4- 15-15  . Ventral hernia   . Inguinal hernia   . History of stress test     a. 2014: no convincing evidence of pharmacologically induced ischemia,  apparent ant apical defect present only on attenuation corrected images favored to reflect artifact due to overcorrection & may be worse on stress imaging secondary to greater patient motion. A small area of ischemia is felt less likely but is difficult to entirely exclude, EF 55%  . ESRD (end stage renal disease) on dialysis (HCC)     a. HD on T,T,S; b. 2/2 focal segmental glomerulosclerosis   . Dialysis patient (HCC)   . Renal insufficiency   . Stroke [facility](HCC) 11/2015    a. MRI L cereblal infarctions in the MCA/PCA, ACA/MCA territory; b. felt to be carotid in etiology; c. medically managed on DAPT per neuro    Past Surgical History  Procedure Laterality Date  . Abdominal hysterectomy    . Cholecystectomy    . Knee surgery Right   . Foot surgery Bilateral   . Av fistula placement Left 10-26-13    Dr Gilda CreaseSchnier  . Peritoneal catheter insertion  03-29-14    Dr Gilda CreaseSchnier  . Inguinal hernia repair Left 07/13/2015    Procedure: HERNIA REPAIR INGUINAL ADULT;  Surgeon: Earline MayotteJeffrey W Byrnett, MD;  Location: [facility] ORS;  Service: General;  Laterality: Left;  Marland Kitchen. Ventral hernia repair N/A 07/13/2015    Procedure: HERNIA REPAIR VENTRAL ADULT;  Surgeon: Earline MayotteJeffrey W Byrnett, MD;  Location: [facility] ORS;  Service: General;  Laterality: N/A;  History of present illness and  Hospital Course:     Kindly see H&P for history of present illness and admission details, please review complete Labs, Consult reports and Test reports for all details in brief  HPI  from the history and physical done on the day of admission  55 year old female patient with hypertension, hyperlipidemia, COPD, ESRD on peritoneal dialysis, congestive heart failure comes in because of difficulty in speech with expressive  aphasia CL syncope. Patient had loss of consciousness and was found on the floor by her husband. Admitted to hospitalist service for stroke evaluation. Patient was recently discharged on December 6 for stroke.  Hospital Course  1 acute multifocal stroke with right-sided weakness secondary to cerebral infarction MCA, PCA, ACA territories. Patient was seen by vascular, neurology. Patient was admitted to ICU, she was given aspirin, Plavix. Patient MRI of the brain showed minimal flow to the left ICA, patient was seen by vascular, angiogram was done.  It showed minimal atherosclerotic lesion at the bifurcation of carotids bilaterally and normal left side long segment. Patient did not have any intervention for that. And maximal medical therapy was advised. Patient continued on aspirin, Plavix, statins. Physical therapy recommended inpatient rehabilitation. Patient also seen by cardiology because of proximal atrial fibrillation, patient advised to have outpatient TEE,. Patient not long-term anticoagulation. Her cHADS  vascular disease score is 4. (. Hypertension, diabetes CHF, and female. I)nitially Toprol-XL was held because of hypertension.    #2 echo showed moderate to severe mitral stenosis needs outpatient TEE.  3.. End-stage renal disease on peritoneal dialysis - Appreciate nephrology following, continue with PD at night  #4.secondary hyperparathyroidism-continue Renvela, Sensipar.  #4 anxiety-continue Xanax when necessary  #5 COPD-no acute exacerbation. Continue Spiriva. 6 elevated troponins: Likely due to demand ischemia. Patient did not have any chest pain. Seen by cardiology. #7 history of forearm nonsustained V. tach with 17 beats of V. tach patient did not have any symptoms.  Needs outpatient 30 day loop monitor. Atrial fibrillation. 8. Acute on chronic systolic heart failure: Stable at this time. Continue peritoneal dialysis.  Disposition patient will be going to inpatient rehabilitation  at [facility] our inpatient rehabilitation at [facility].Stable for Discharge Today.  Discharge Condition: stable   Follow UP      Discharge Instructions  and  Discharge Medications        Medication List    STOP taking these medications        amoxicillin-clavulanate 875-125 MG tablet  Commonly known as:  AUGMENTIN      TAKE these medications        acetaminophen 325 MG tablet  Commonly known as:  TYLENOL  Take 325 mg by mouth every 4 (four) hours as needed for mild pain.     ALPRAZolam 0.25 MG tablet  Commonly known as:  XANAX  Take 1 tablet (0.25 mg total) by mouth 3 (three) times daily as needed for sleep.     aspirin EC 81 MG tablet  Take 1 tablet (81 mg total) by mouth daily.     atorvastatin 20 MG tablet  Commonly known as:  LIPITOR  Take 1 tablet (20 mg total) by mouth daily.     Calcium Acetate 667 MG Tabs  Take 3 capsules by mouth 3 (three) times daily.     clopidogrel 75 MG tablet  Commonly known as:  PLAVIX  Take 1 tablet (75 mg total) by mouth daily.     feeding supplement (ENSURE ENLIVE) Liqd  Take 237  mLs by mouth 2 (two) times daily between meals.     furosemide 40 MG tablet  Commonly known as:  LASIX  Take 40-80 mg by mouth daily.     metoprolol succinate 25 MG 24 hr tablet  Commonly known as:  TOPROL-XL  Take 25 mg by mouth daily.     ondansetron 4 MG disintegrating tablet  Commonly known as:  ZOFRAN ODT  Take 1 tablet (4 mg total) by mouth every 8 (eight) hours as needed for nausea or vomiting.     potassium chloride 10 MEQ tablet  Commonly known as:  K-DUR,KLOR-CON  Take 2 tablets (20 mEq total) by mouth daily.     RENVELA 800 MG tablet  Generic drug:  sevelamer carbonate  Take 1,600 mg by mouth 3 (three) times daily with meals. And one tablet with snacks     tiotropium 18 MCG inhalation capsule  Commonly known as:  SPIRIVA  Place 1 capsule (18 mcg total) into inhaler and inhale daily.     VENTOLIN HFA 108 (90 Base) MCG/ACT  inhaler  Generic drug:  albuterol  Inhale 1-2 puffs into the lungs every 6 (six) hours as needed for wheezing or shortness of breath.          Diet and Activity recommendation: See Discharge Instructions above   Consults obtained - cardio Nephrology neurology    Major procedures and Radiology Reports - PLEASE review detailed and final reports for all details, in brief -      Dg Chest 1 View  12/18/2015  CLINICAL DATA:  Fever today. History of hypertension, hyperlipidemia, COPD, end-stage renal disease on peritoneal dialysis, congestive heart failure, anemia. EXAM: CHEST 1 VIEW COMPARISON:  11/17/2015 FINDINGS: Cardiac silhouette is mild-to-moderately enlarged. No mediastinal or hilar masses or evidence of adenopathy. Mild hazy opacity at the right lung base likely a combination of a small effusion and atelectasis. Pneumonia is possible. Remainder of the lungs is clear. No evidence of a left pleural effusion. No pneumothorax. Lungs are hyperexpanded. Bony thorax is grossly intact. IMPRESSION: 1. Right lung base opacity likely combination of a small effusion and atelectasis. Pneumonia is possible. 2. No other evidence of acute cardiopulmonary disease. Stable cardiomegaly. Electronically Signed   By: Amie Portland M.D.   On: 12/18/2015 10:53   Ct Head Wo Contrast  12/11/2015  CLINICAL DATA:  Larey Seat in bathroom. Slurred speech and altered mental status. History of hypertension, hyperlipidemia, and diabetes, end-stage renal disease on dialysis. EXAM: CT HEAD WITHOUT CONTRAST TECHNIQUE: Contiguous axial images were obtained from the base of the skull through the vertex without intravenous contrast. COMPARISON:  MRI of the brain November 17, 2015 FINDINGS: The ventricles and sulci are normal. No intraparenchymal hemorrhage, mass effect nor midline shift. No acute large vascular territory infarcts. Small area RIGHT frontal encephalomalacia. No abnormal extra-axial fluid collections. Basal cisterns  are patent. A few tiny extra-axial punctate calcifications. No skull fracture. The included ocular globes and orbital contents are non-suspicious. The mastoid aircells and included paranasal sinuses are well-aerated. IMPRESSION: No acute intracranial process. Focal RIGHT frontal encephalomalacia may be posttraumatic or ischemic. Punctate extra-axial calcifications could be vascular. Electronically Signed   By: Awilda Metro M.D.   On: 12/11/2015 00:09   Ct Angio Neck W/cm &/or Wo/cm  12/11/2015  CLINICAL DATA:  Multiple acute LEFT hemisphere infarcts. Abnormal appearance of the LEFT internal carotid artery on MRI and MRA. Right-sided weakness with aphasia beginning yesterday. EXAM: CT ANGIOGRAPHY NECK TECHNIQUE: Multidetector CT imaging of the  neck was performed using the standard protocol during bolus administration of intravenous contrast. Multiplanar CT image reconstructions and MIPs were obtained to evaluate the vascular anatomy. Carotid stenosis measurements (when applicable) are obtained utilizing NASCET criteria, using the distal internal carotid diameter as the denominator. CONTRAST:  OMNIPAQUE IOHEXOL 350 MG/ML SOLN COMPARISON:  MRI and MRA earlier today.  CT head 12/10/2015. FINDINGS: Aortic arch: Standard branching. Imaged portion shows no evidence of aneurysm or dissection. No significant stenosis of the major arch vessel origins. Right carotid system: Minor calcific disease in the common carotid artery and bifurcation. Widely patent RIGHT ICA. No evidence of dissection, stenosis (50% or greater) or occlusion. Left carotid system: Moderate calcific and soft plaque is present in the bulb. In the cervical ICA at approximately the C2 level, the vessel caliber becomes diffusely small throughout the cervical segment. No skull base pseudoaneurysm. No intraluminal flap. The horizontal and vertical petrous segments of the LEFT ICA are patent but small but patent. No evidence of dissection, stenosis  (50% or greater) or occlusion. Vertebral arteries: Codominant. No evidence of dissection, stenosis (50% or greater) or occlusion. Skeleton: Negative Other neck: No masses. No lung apex lesion. Mild apical pleural thickening. IMPRESSION: Normal RIGHT anterior circulation. Non stenotic calcific and soft plaque at the LEFT ICA origin, but no flow-limiting stenosis at that level nor is there strong evidence for LEFT ICA dissection. The cervical LEFT ICA becomes diffusely small but is patent at the C2 level extending Cephalad to the skullbase. This is favored to represent luminal narrowing due to severe intracranial stenosis, involving the LICA cavernous or supraclinoid level, or both. Electronically Signed   By: Elsie Stain M.D.   On: 12/11/2015 17:28   Mr Brain Wo Contrast  12/11/2015  CLINICAL DATA:  Patient with atrial fibrillation not on long-term anticoagulation, also end-stage renal disease, hypertension, and diabetes, presents with disorder of speech 12/10/2015. Also Syncopal episode. EXAM: MRI HEAD WITHOUT CONTRAST MRA HEAD WITHOUT CONTRAST TECHNIQUE: Multiplanar, multiecho pulse sequences of the brain and surrounding structures were obtained without intravenous contrast. Angiographic images of the head were obtained using MRA technique without contrast. COMPARISON:  MR head 11/17/2015.  CT head 12/10/2015. FINDINGS: MRI HEAD FINDINGS Multifocal areas of acute infarction affect the LEFT hemisphere, including LEFT frontal, temporal, parietal, and occipital lobes, LEFT caudate nucleus and lentiform nucleus, and regional subcortical greater than periventricular white matter. No posterior fossa involvement. No RIGHT hemisphere involvement. These could be related to multiple emboli from the diseased LEFT ICA described below, or watershed type phenomenon from hypoperfusion. Normal for age cerebral volume. Paucity of white matter disease given patient's comorbidities. Remote RIGHT posterior frontal and  subcortical infarction. No significant chronic hemorrhage. No midline abnormality. Extracranial soft tissues unremarkable. No osseous findings MRA HEAD FINDINGS The RIGHT internal carotid artery is widely patent. The basilar artery is widely patent with RIGHT vertebral dominant. There is a 50% stenosis of the non dominant LEFT vertebral at its junction with basilar. The RIGHT M1 MCA is widely patent. Minor non stenotic narrowing of the proximal RIGHT A1 ACA. The anterior communicating artery poorly seen. There is apparent cross fill right-to-left supplying the anterior circulation. There is some flow related enhancement of the LEFT ICA in its cavernous and supraclinoid segment, but there is diminished to absent petrous LEFT ICA flow. Tapering flow related enhancement is observed in a small but patent LEFT cervical ICA. LEFT M1 MCA small but patent. Severe stenosis proximal A2 LEFT ACA. Severely diminished flow related enhancement  in the LEFT M2 and M3 vessels. No PCA stenosis or occlusion. No cerebellar branch occlusion. No intracranial aneurysm. Compared with the prior MRI from 11/17/2015, the infarctions are new, and the LEFT ICA appears to be further diminished caliber. IMPRESSION: Multifocal areas of acute infarction affecting the LEFT hemisphere without visible hemorrhage. It is unclear if these represent multiple emboli or watershed type phenomenon. No posterior fossa or RIGHT hemisphere involvement. Segmental areas of diminished or absent flow related enhancement in the LEFT ICA. String sign is suspected. Formal catheter angiogram recommended for further evaluation to potentially re-establish flow in a diseased vessel. Critical Value/emergent results were called by telephone at the time of interpretation on 12/11/2015 at 1:49 pm to Dr. Nemiah Commander , who verbally acknowledged these results. Electronically Signed   By: Elsie Stain M.D.   On: 12/11/2015 13:51   US Carotid Bilateral  11/18/2015  CLINICAL DATA:   Cerebral infarction. EXAM: BILATERAL CAROTID DUPLEX ULTRASOUND TECHNIQUE: Wallace Cullens scale imaging, color Doppler and duplex ultrasound were performed of bilateral carotid and vertebral arteries in the neck. COMPARISON:  None. FINDINGS: Criteria: Quantification of carotid stenosis is based on velocity parameters that correlate the residual internal carotid diameter with NASCET-based stenosis levels, using the diameter of the distal internal carotid lumen as the denominator for stenosis measurement. The following velocity measurements were obtained: RIGHT ICA:  54/19 cm/sec CCA:  91/28 cm/sec SYSTOLIC ICA/CCA RATIO:  0.6 DIASTOLIC ICA/CCA RATIO:  0.7 ECA:  45 cm/sec LEFT ICA:  25/11 cm/sec CCA:  65/13 cm/sec SYSTOLIC ICA/CCA RATIO:  0.4 DIASTOLIC ICA/CCA RATIO:  0.8 ECA:  49 cm/sec RIGHT CAROTID ARTERY: There is a mild amount of calcified plaque at the level of the distal common carotid artery, carotid bulb and proximal ICA. Based on velocities estimated right ICA stenosis is less than 50%. RIGHT VERTEBRAL ARTERY: Antegrade flow with normal waveform and velocity. LEFT CAROTID ARTERY: Moderate focal calcified plaque present at the level of the distal bulb and proximal ICA. Based on turbulent flow focally present in the plaque, there may be a component of plaque ulceration. Based on normal velocities, estimated left ICA stenosis is less than 50%. LEFT VERTEBRAL ARTERY: Antegrade flow with normal waveform and velocity. IMPRESSION: No significant carotid stenosis identified. Bilateral plaque noted at the level of the bulbs and proximal internal carotid arteries, left greater than right. The proximal ICA plaque on the left may be partially ulcerated. Estimated bilateral ICA stenoses are less than 50%. Electronically Signed   By: Irish Lack M.D.   On: 11/18/2015 15:37   Mr Maxine Glenn Head/brain Wo Cm  12/11/2015  CLINICAL DATA:  Patient with atrial fibrillation not on long-term anticoagulation, also end-stage renal disease,  hypertension, and diabetes, presents with disorder of speech 12/10/2015. Also Syncopal episode. EXAM: MRI HEAD WITHOUT CONTRAST MRA HEAD WITHOUT CONTRAST TECHNIQUE: Multiplanar, multiecho pulse sequences of the brain and surrounding structures were obtained without intravenous contrast. Angiographic images of the head were obtained using MRA technique without contrast. COMPARISON:  MR head 11/17/2015.  CT head 12/10/2015. FINDINGS: MRI HEAD FINDINGS Multifocal areas of acute infarction affect the LEFT hemisphere, including LEFT frontal, temporal, parietal, and occipital lobes, LEFT caudate nucleus and lentiform nucleus, and regional subcortical greater than periventricular white matter. No posterior fossa involvement. No RIGHT hemisphere involvement. These could be related to multiple emboli from the diseased LEFT ICA described below, or watershed type phenomenon from hypoperfusion. Normal for age cerebral volume. Paucity of white matter disease given patient's comorbidities. Remote RIGHT posterior frontal and  subcortical infarction. No significant chronic hemorrhage. No midline abnormality. Extracranial soft tissues unremarkable. No osseous findings MRA HEAD FINDINGS The RIGHT internal carotid artery is widely patent. The basilar artery is widely patent with RIGHT vertebral dominant. There is a 50% stenosis of the non dominant LEFT vertebral at its junction with basilar. The RIGHT M1 MCA is widely patent. Minor non stenotic narrowing of the proximal RIGHT A1 ACA. The anterior communicating artery poorly seen. There is apparent cross fill right-to-left supplying the anterior circulation. There is some flow related enhancement of the LEFT ICA in its cavernous and supraclinoid segment, but there is diminished to absent petrous LEFT ICA flow. Tapering flow related enhancement is observed in a small but patent LEFT cervical ICA. LEFT M1 MCA small but patent. Severe stenosis proximal A2 LEFT ACA. Severely diminished flow  related enhancement in the LEFT M2 and M3 vessels. No PCA stenosis or occlusion. No cerebellar branch occlusion. No intracranial aneurysm. Compared with the prior MRI from 11/17/2015, the infarctions are new, and the LEFT ICA appears to be further diminished caliber. IMPRESSION: Multifocal areas of acute infarction affecting the LEFT hemisphere without visible hemorrhage. It is unclear if these represent multiple emboli or watershed type phenomenon. No posterior fossa or RIGHT hemisphere involvement. Segmental areas of diminished or absent flow related enhancement in the LEFT ICA. String sign is suspected. Formal catheter angiogram recommended for further evaluation to potentially re-establish flow in a diseased vessel. Critical Value/emergent results were called by telephone at the time of interpretation on 12/11/2015 at 1:49 pm to Dr. Nemiah Commander , who verbally acknowledged these results. Electronically Signed   By: Elsie Stain M.D.   On: 12/11/2015 13:51    Micro Results     Recent Results (from the past 240 hour(s))  MRSA PCR Screening     Status: None   Collection Time: 12/11/15  8:19 PM  Result Value Ref Range Status   MRSA by PCR NEGATIVE NEGATIVE Final    Comment:        The GeneXpert MRSA Assay (FDA approved for NASAL specimens only), is one component of a comprehensive MRSA colonization surveillance program. It is not intended to diagnose MRSA infection nor to guide or monitor treatment for MRSA infections.   Body fluid culture     Status: None (Preliminary result)   Collection Time: 12/15/15  9:00 AM  Result Value Ref Range Status   Specimen Description ABDOMEN  Final   Special Requests Immunocompromised  Final   Gram Stain NO WBC SEEN NO ORGANISMS SEEN   Final   Culture NO GROWTH 3 DAYS  Final   Report Status PENDING  Incomplete       Today   Subjective:   Dorothy Long today , has right-sided hemiplegia, expressive aphagia. Had low-grade temp. But no chest  pain, no shortness of breath, no nausea, no vomiting, no diarrhea. Stable for going to inpatient rehabilitation when bed is available.  Objective:   Blood pressure 113/78, pulse 74, temperature 99.1 F (37.3 C), temperature source Oral, resp. rate 18, height 5\' 4"  (1.626 m), weight 46.857 kg (103 lb 4.8 oz), SpO2 99 %.   Intake/Output Summary (Last 24 hours) at 12/18/15 1216 Last data filed at 12/18/15 0815  Gross per 24 hour  Intake      0 ml  Output    540 ml  Net   -540 ml    Exam Awake Alert, Oriented x 3, [place].AT,PERRAL Supple Neck,No JVD, No cervical lymphadenopathy appriciated.  Symmetrical  Chest wall movement, Good air movement bilaterally, CTAB RRR,No Gallops,Rubs or new Murmurs, No Parasternal Heave +ve B.Sounds, Abd Soft, Non tender, No organomegaly appriciated, No rebound -guarding or rigidity. No Cyanosis, Clubbing or edema, No new Rash or bruise Right sideweakness secondary to stroke, does have some expressive aphasia. Data Review   CBC w Diff: Lab Results  Component Value Date   WBC 4.8 12/14/2015   WBC 7.7 11/15/2014   WBC 7.7 11/15/2014   HGB 8.9* 12/14/2015   HGB 10.2* 11/15/2014   HCT 27.7* 12/14/2015   HCT 31.6* 11/15/2014   PLT 126* 12/14/2015   PLT 117* 11/15/2014   LYMPHOPCT 10 12/11/2015   LYMPHOPCT 6.1 11/13/2014   MONOPCT 8 12/11/2015   MONOPCT 6.1 11/13/2014   EOSPCT 0 12/11/2015   EOSPCT 0.0 11/13/2014   BASOPCT 1 12/11/2015   BASOPCT 0.2 11/13/2014    CMP: Lab Results  Component Value Date   NA 135 12/12/2015   NA 135* 11/15/2014   NA 135* 11/15/2014   K 3.5 12/12/2015   K 3.7 11/15/2014   CL 99* 12/12/2015   CL 102 11/15/2014   CO2 21* 12/12/2015   CO2 16* 11/15/2014   BUN 62* 12/12/2015   BUN 75* 11/15/2014   BUN 75* 11/15/2014   CREATININE 9.59* 12/12/2015   CREATININE 8.87* 11/15/2014   CREATININE 8.9* 11/15/2014   GLU 138 11/15/2014   PROT 6.2* 12/10/2015   PROT 7.1 11/07/2014   ALBUMIN 3.0* 12/10/2015   ALBUMIN 2.8*  11/07/2014   BILITOT 0.9 12/10/2015   BILITOT 0.5 11/07/2014   ALKPHOS 104 12/10/2015   ALKPHOS 134* 11/07/2014   AST 23 12/10/2015   AST 34 11/07/2014   ALT 14 12/10/2015   ALT 24 11/07/2014  .   Total Time in preparing paper work, data evaluation and todays exam - 35 minutes  Russell Quinney M.D on 12/18/2015 at 12:16 PM    Note: This dictation was prepared with Dragon dictation along with smaller phrase technology. Any transcriptional errors that result from this process are unintentional.

## 2015-12-18 NOTE — Progress Notes (Signed)
Tx started. Vitals stable. B/P 115/90, resp 18, pulse 96, temp 98.5. Weight 47.7kg. Dressing changed. Access c/d/i , no signs of infections.

## 2015-12-18 NOTE — Progress Notes (Signed)
[facility]  ROUNDING NOTE   Subjective:   Peritoneal dialysis last night. Tolerated treatment well. UF of  Husband and father at bedside.   Objective:  Vital signs in last 24 hours:  Temp:  [97.9 F (36.6 C)-99.1 F (37.3 C)] 99.1 F (37.3 C) (01/03 0951) Pulse Rate:  [58-81] 74 (01/03 0951) Resp:  [16-18] 18 (01/03 0951) BP: (113-132)/(59-91) 113/78 mmHg (01/03 0951) SpO2:  [99 %-100 %] 99 % (01/03 0951)  Weight change: -4.899 kg (-10 lb 12.8 oz) Filed Weights   12/16/15 0853 12/17/15 0500 12/17/15 0930  Weight: 51.755 kg (114 lb 1.6 oz) 51.755 kg (114 lb 1.6 oz) 46.857 kg (103 lb 4.8 oz)    Intake/Output: I/O last 3 completed shifts: In: 0  Out: 733 [Other:733]   Intake/Output this shift:     Physical Exam: General: NAD, sitting in bed  Head: Normocephalic, atraumatic. Moist oral mucosal membranes  Eyes: Anicteric  Neck: Supple, trachea midline  Lungs:  Clear to auscultation normal effort  Heart: S1S2 no rubs  Abdomen:  Soft, nontender, BS present   Extremities: no peripheral edema.  Neurologic: Right upper extremity strength 3/5, right lower extremity 3/5  Skin: No lesions  Access: PD catheter in place    Basic Metabolic Panel:  Recent Labs Lab 12/12/15 0340 12/13/15 1257  NA 135  --   K 3.5  --   CL 99*  --   CO2 21*  --   GLUCOSE 93  --   BUN 62*  --   CREATININE 9.59*  --   CALCIUM 8.2*  --   PHOS  --  9.3*    Liver Function Tests: No results for input(s): AST, ALT, ALKPHOS, BILITOT, PROT, ALBUMIN in the last 168 hours. No results for input(s): LIPASE, AMYLASE in the last 168 hours. No results for input(s): AMMONIA in the last 168 hours.  CBC:  Recent Labs Lab 12/14/15 0726  WBC 4.8  HGB 8.9*  HCT 27.7*  MCV 86.7  PLT 126*    Cardiac Enzymes:  Recent Labs Lab 12/11/15 1502  TROPONINI 4.79*    BNP: Invalid input(s): POCBNP  CBG: No results for input(s): GLUCAP in the last 168  hours.  Microbiology: Results for orders placed or performed during the hospital encounter of 12/10/15  MRSA PCR Screening     Status: None   Collection Time: 12/11/15  8:19 PM  Result Value Ref Range Status   MRSA by PCR NEGATIVE NEGATIVE Final    Comment:        The GeneXpert MRSA Assay (FDA approved for NASAL specimens only), is one component of a comprehensive MRSA colonization surveillance program. It is not intended to diagnose MRSA infection nor to guide or monitor treatment for MRSA infections.   Body fluid culture     Status: None (Preliminary result)   Collection Time: 12/15/15  9:00 AM  Result Value Ref Range Status   Specimen Description ABDOMEN  Final   Special Requests Immunocompromised  Final   Gram Stain NO WBC SEEN NO ORGANISMS SEEN   Final   Culture NO GROWTH 3 DAYS  Final   Report Status PENDING  Incomplete    Coagulation Studies: No results for input(s): LABPROT, INR in the last 72 hours.  Urinalysis: No results for input(s): COLORURINE, LABSPEC, PHURINE, GLUCOSEU, HGBUR, BILIRUBINUR, KETONESUR, PROTEINUR, UROBILINOGEN, NITRITE, LEUKOCYTESUR in the last 72 hours.  Invalid input(s): APPERANCEUR    Imaging: Dg Chest 1 View  12/18/2015  CLINICAL DATA:  Fever today. History of hypertension, hyperlipidemia, COPD, end-stage renal disease on peritoneal dialysis, congestive heart failure, anemia. EXAM: CHEST 1 VIEW COMPARISON:  11/17/2015 FINDINGS: Cardiac silhouette is mild-to-moderately enlarged. No mediastinal or hilar masses or evidence of adenopathy. Mild hazy opacity at the right lung base likely a combination of a small effusion and atelectasis. Pneumonia is possible. Remainder of the lungs is clear. No evidence of a left pleural effusion. No pneumothorax. Lungs are hyperexpanded. Bony thorax is grossly intact. IMPRESSION: 1. Right lung base opacity likely combination of a small effusion and atelectasis. Pneumonia is possible. 2. No other evidence of acute  cardiopulmonary disease. Stable cardiomegaly. Electronically Signed   By: Amie Portlandavid  Ormond M.D.   On: 12/18/2015 10:53     Medications:     . aspirin EC  81 mg Oral Daily  . atorvastatin  20 mg Oral Daily  . calcium acetate  667 mg Oral TID  . clopidogrel  75 mg Oral Daily  . dialysis solution 1.5% low-MG/low-CA   Intraperitoneal Q24H  . feeding supplement (ENSURE ENLIVE)  237 mL Oral BID BM  . gentamicin cream  1 application Topical Daily  . heparin  5,000 Units Subcutaneous 3 times per day  . sevelamer carbonate  1,600 mg Oral TID WC  . sodium chloride  3 mL Intravenous Q12H  . tiotropium  18 mcg Inhalation Daily   acetaminophen **OR** acetaminophen, albuterol, ALPRAZolam, morphine injection, ondansetron (ZOFRAN) IV, senna-docusate  Assessment/ Plan:  55 y.o. female with PMHx of of hypertension, hyperlipidemia, vitamin D deficiency, tobacco abuse, edema, cholecystectomy,hysterectomy, ESRD secondary to FSGS, SHPTH, severe mitral stenosis, Liver cirrhosis, inguinal hernia repair.  ADmitted 12/10/15 with fall from home and found to have multiple acute areas of infarction in the left cerebral hemisphere.  1.  End-stage renal disease on peritoneal dialysis.  Continue current prescription of 1.5% dextrose solution.   - gentamicin to exit site  2.  Anemia chronic kidney disease.  Hemoglobin 8.9.   - avoiding Epogen given multiple areas of cerebral infarction.  3.  Secondary hyperparathyroidism.  Phos 9.3. - calcium acetate, sevelamer with meals  4.  Acute CVA.  Multiple areas of acute infarction involving the left hemisphere noted. - appreciate neurology input.  - Continue to work with PT/OT   5. Hypertension: will allow to rise due to recent ischemic event. Currently not on any agents. Blood pressure currently at goal.   LOS: 7 Prem Coykendall 1/3/201711:24 AM

## 2015-12-19 ENCOUNTER — Ambulatory Visit (HOSPITAL_COMMUNITY)
Admission: AD | Admit: 2015-12-19 | Discharge: 2015-12-19 | Disposition: A | Discharge status: Home / Self Care | Payer: BC Managed Care – PPO | Source: Other Acute Inpatient Hospital | Attending: Internal Medicine | Admitting: Internal Medicine

## 2015-12-19 DIAGNOSIS — I639 Cerebral infarction, unspecified: Secondary | ICD-10-CM | POA: Insufficient documentation

## 2015-12-19 LAB — CBC
HEMATOCRIT: 32.8 % — AB (ref 35.0–47.0)
Hemoglobin: 10.7 g/dL — ABNORMAL LOW (ref 12.0–16.0)
MCH: 28 pg (ref 26.0–34.0)
MCHC: 32.7 g/dL (ref 32.0–36.0)
MCV: 85.8 fL (ref 80.0–100.0)
Platelets: 137 10*3/uL — ABNORMAL LOW (ref 150–440)
RBC: 3.82 MIL/uL (ref 3.80–5.20)
RDW: 18.1 % — ABNORMAL HIGH (ref 11.5–14.5)
WBC: 5.7 10*3/uL (ref 3.6–11.0)

## 2015-12-19 LAB — BODY FLUID CULTURE
Culture: NO GROWTH
Gram Stain: NONE SEEN

## 2015-12-19 MED ORDER — AMOXICILLIN-POT CLAVULANATE 875-125 MG PO TABS: 1.0000 | ORAL_TABLET | Freq: Two times a day (BID) | ORAL | Status: DC

## 2015-12-19 NOTE — Discharge Summary (Signed)
Dorothy Long, is a 55 y.o. female  DOB 1961-11-18  MRN [id number].  Admission date:  12/10/2015  Admitting Physician  Ihor Austin, MD  Discharge Date:  12/19/2015   Primary MD  Sherlene Shams, MD  Recommendations for primary care physician for things to follow:  Follow-up with Dr. Dossie Arbour in 2 weeks.     Admission Diagnosis  Aphasia [R47.01] Confusion [R41.0] Elevated troponin [R79.89]   Discharge Diagnosis  Aphasia [R47.01] Confusion [R41.0] Elevated troponin [R79.89]   Principal Problem:   CVA (cerebral infarction) Active Problems:   Dysarthria   ESRD (end stage renal disease) on dialysis (HCC)   TIA (transient ischemic attack)   Acute CVA (cerebrovascular accident) (HCC)   NSTEMI (non-ST elevated myocardial infarction) (HCC)   Aphasia   Confusion   Elevated troponin   Mitral valve stenosis   Congestive dilated cardiomyopathy (HCC)   Carotid stenosis      Past Medical History  Diagnosis Date  . Hypertension   . Hyperlipidemia   . COPD (chronic obstructive pulmonary disease) (HCC)     a. not on home O2  . Anemia   . Valvular disease     a. 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 echo 2015: EF 45-50%, borderline LVH, mod dilated LA, mildly dilated RA, small pericardial effusion, mod MR (rheumatic deformity), MS mild-mod, no gradient, PASP ele  . Chronic combined systolic and diastolic CHF (congestive heart failure) (HCC)     a. echo reports above  . PAF (paroxysmal atrial fibrillation) (HCC)     a. in setting of diarrhea 09/21/2013; b. not on long term full dose anticoagulation; c. CHADSVASC at least 4 (CHF, HTN, DM,  female)  . Diabetes mellitus (HCC)   . Peritoneal dialysis status (HCC) 4- 15-15  . Ventral hernia   . Inguinal hernia   . History of stress test     a. 2014: no convincing evidence of pharmacologically induced ischemia,  apparent ant apical defect present only on attenuation corrected images favored to reflect artifact due to overcorrection & may be worse on stress imaging secondary to greater patient motion. A small area of ischemia is felt less likely but is difficult to entirely exclude, EF 55%  . ESRD (end stage renal disease) on dialysis (HCC)     a. HD on T,T,S; b. 2/2 focal segmental glomerulosclerosis   . Dialysis patient (HCC)   . Renal insufficiency   . Stroke [facility]) 11/2015    a. MRI L cereblal infarctions in the MCA/PCA, ACA/MCA territory; b. felt to be carotid in etiology; c. medically managed on DAPT per neuro    Past Surgical History  Procedure Laterality Date  . Abdominal hysterectomy    . Cholecystectomy    . Knee surgery Right   . Foot surgery Bilateral   . Av fistula placement Left 10-26-13    Dr Gilda Crease  . Peritoneal catheter insertion  03-29-14    Dr Gilda Crease  . Inguinal hernia repair Left 07/13/2015    Procedure: HERNIA REPAIR INGUINAL ADULT;  Surgeon: Earline Mayotte, MD;  Location: [facility] ORS;  Service: General;  Laterality: Left;  Marland Kitchen Ventral hernia repair N/A 07/13/2015    Procedure: HERNIA REPAIR VENTRAL ADULT;  Surgeon: Earline Mayotte, MD;  Location: [facility] ORS;  Service: General;  Laterality: N/A;  History of present illness and  Hospital Course:     Kindly see H&P for history of present illness and admission details, please review complete Labs, Consult reports and Test reports for all details in brief  HPI  from the history and physical done on the day of admission  55 year old female patient with hypertension, hyperlipidemia, COPD, ESRD on peritoneal dialysis, congestive heart failure comes in because of difficulty in speech with expressive  aphasia CL syncope. Patient had loss of consciousness and was found on the floor by her husband. Admitted to hospitalist service for stroke evaluation. Patient was recently discharged on December 6 for stroke.  Hospital Course  1 acute multifocal stroke with right-sided weakness secondary to cerebral infarction MCA, PCA, ACA territories. Patient was seen by vascular, neurology. Patient was admitted to ICU, she was given aspirin, Plavix. Patient MRI of the brain showed minimal flow to the left ICA, patient was seen by vascular, angiogram was done.  It showed minimal atherosclerotic lesion at the bifurcation of carotids bilaterally and normal left side long segment. Patient did not have any intervention for that. And maximal medical therapy was advised. Patient continued on aspirin, Plavix, statins. Physical therapy recommended inpatient rehabilitation. Patient also seen by cardiology because of proximal atrial fibrillation, patient advised to have outpatient TEE,. Patient not long-term anticoagulation. Her cHADS  vascular disease score is 4. (. Hypertension, diabetes CHF, and female. I)nitially Toprol-XL was held because of hypertension.    #2 echo showed moderate to severe mitral stenosis needs outpatient TEE.  3.. End-stage renal disease on peritoneal dialysis - Appreciate nephrology following, continue with PD at night  #4.secondary hyperparathyroidism-continue Renvela, Sensipar.  #4 anxiety-continue Xanax when necessary  #5 COPD-no acute exacerbation. Continue Spiriva. 6 elevated troponins: Likely due to demand ischemia. Patient did not have any chest pain. Seen by cardiology. #7 history of forearm nonsustained V. tach with 17 beats of V. tach patient did not have any symptoms.  Needs outpatient 30 day loop monitor. Atrial fibrillation. 8. Acute on chronic systolic heart failure: Stable at this time. Continue peritoneal dialysis. 9. Cough, and also right-sided pneumonia: Likely secondary to  aspiration: Advised to outpatient to make sure she swallows completely before she takes next bite. And the patient can be started on  Augmentin. Disposition patient will be going to inpatient rehabilitation at [facility] our inpatient rehabilitatio Today.  Discharge Condition: stable   Follow UP      Discharge Instructions  and  Discharge Medications        Medication List    STOP taking these medications        amoxicillin-clavulanate 875-125 MG tablet  Commonly known as:  AUGMENTIN      TAKE these medications        acetaminophen 325 MG tablet  Commonly known as:  TYLENOL  Take 325 mg by mouth every 4 (four) hours as needed for mild pain.     ALPRAZolam 0.25 MG tablet  Commonly known as:  XANAX  Take 1 tablet (0.25 mg total) by mouth 3 (three) times daily as needed for sleep.     aspirin EC 81 MG tablet  Take 1 tablet (81 mg total) by mouth daily.     atorvastatin 20 MG tablet  Commonly known as:  LIPITOR  Take 1 tablet (20 mg total) by mouth daily.     Calcium Acetate 667 MG Tabs  Take 3 capsules by mouth 3 (three) times daily.     clopidogrel 75 MG tablet  Commonly known as:  PLAVIX  Take 1 tablet (75 mg total) by mouth daily.     feeding supplement (ENSURE ENLIVE) Liqd  Take 237 mLs by mouth 2 (two) times daily between meals.     furosemide 40 MG tablet  Commonly known as:  LASIX  Take 40-80 mg by mouth daily.     metoprolol succinate 25 MG 24 hr tablet  Commonly known as:  TOPROL-XL  Take 25 mg by mouth daily.     ondansetron 4 MG disintegrating tablet  Commonly known as:  ZOFRAN ODT  Take 1 tablet (4 mg total) by mouth every 8 (eight) hours as needed for nausea or vomiting.     potassium chloride 10 MEQ tablet  Commonly known as:  K-DUR,KLOR-CON  Take 2 tablets (20 mEq total) by mouth daily.     RENVELA 800 MG tablet  Generic drug:  sevelamer carbonate  Take 1,600 mg by mouth 3 (three) times daily with meals. And one tablet with snacks      tiotropium 18 MCG inhalation capsule  Commonly known as:  SPIRIVA  Place 1 capsule (18 mcg total) into inhaler and inhale daily.     VENTOLIN HFA 108 (90 Base) MCG/ACT inhaler  Generic drug:  albuterol  Inhale 1-2 puffs into the lungs every 6 (six) hours as needed for wheezing or shortness of breath.          Diet and Activity recommendation: See Discharge Instructions above   Consults obtained - cardio Nephrology neurology    Major procedures and Radiology Reports - PLEASE review detailed and final reports for all details, in brief -      Dg Chest 1 View  12/18/2015  CLINICAL DATA:  Fever today. History of hypertension, hyperlipidemia, COPD, end-stage renal disease on peritoneal dialysis, congestive heart failure, anemia. EXAM: CHEST 1 VIEW COMPARISON:  11/17/2015 FINDINGS: Cardiac silhouette is mild-to-moderately enlarged. No mediastinal or hilar masses or evidence of adenopathy. Mild hazy opacity at the right lung base likely a combination of a small effusion and atelectasis. Pneumonia is possible. Remainder of the lungs is clear. No evidence of a left pleural effusion. No pneumothorax. Lungs are hyperexpanded. Bony thorax is grossly intact. IMPRESSION: 1. Right lung base opacity likely combination of a small effusion and atelectasis. Pneumonia is possible. 2. No other evidence of acute cardiopulmonary disease. Stable cardiomegaly. Electronically Signed   By: Amie Portland M.D.   On: 12/18/2015 10:53   Ct Head Wo Contrast  12/11/2015  CLINICAL DATA:  Larey Seat in bathroom. Slurred speech and altered mental status. History of hypertension, hyperlipidemia, and diabetes, end-stage renal disease on dialysis. EXAM: CT HEAD WITHOUT CONTRAST TECHNIQUE: Contiguous axial images were obtained from the base of the skull through the vertex without intravenous contrast. COMPARISON:  MRI of the brain November 17, 2015 FINDINGS: The ventricles and sulci are normal. No intraparenchymal hemorrhage, mass  effect nor midline shift. No acute large vascular territory infarcts. Small area RIGHT frontal encephalomalacia. No abnormal extra-axial fluid collections. Basal cisterns are patent. A few tiny extra-axial punctate calcifications. No skull fracture. The included ocular globes and orbital contents are non-suspicious. The mastoid aircells and included paranasal sinuses are well-aerated. IMPRESSION: No acute intracranial process. Focal RIGHT frontal encephalomalacia may be posttraumatic or ischemic. Punctate extra-axial calcifications could be vascular. Electronically Signed   By: Awilda Metro M.D.   On: 12/11/2015 00:09   Ct Angio Neck W/cm &/or Wo/cm  12/11/2015  CLINICAL DATA:  Multiple acute LEFT hemisphere infarcts. Abnormal  appearance of the LEFT internal carotid artery on MRI and MRA. Right-sided weakness with aphasia beginning yesterday. EXAM: CT ANGIOGRAPHY NECK TECHNIQUE: Multidetector CT imaging of the neck was performed using the standard protocol during bolus administration of intravenous contrast. Multiplanar CT image reconstructions and MIPs were obtained to evaluate the vascular anatomy. Carotid stenosis measurements (when applicable) are obtained utilizing NASCET criteria, using the distal internal carotid diameter as the denominator. CONTRAST:  100mL OMNIPAQUE IOHEXOL 350 MG/ML SOLN COMPARISON:  MRI and MRA earlier today.  CT head 12/10/2015. FINDINGS: Aortic arch: Standard branching. Imaged portion shows no evidence of aneurysm or dissection. No significant stenosis of the major arch vessel origins. Right carotid system: Minor calcific disease in the common carotid artery and bifurcation. Widely patent RIGHT ICA. No evidence of dissection, stenosis (50% or greater) or occlusion. Left carotid system: Moderate calcific and soft plaque is present in the bulb. In the cervical ICA at approximately the C2 level, the vessel caliber becomes diffusely small throughout the cervical segment. No skull  base pseudoaneurysm. No intraluminal flap. The horizontal and vertical petrous segments of the LEFT ICA are patent but small but patent. No evidence of dissection, stenosis (50% or greater) or occlusion. Vertebral arteries: Codominant. No evidence of dissection, stenosis (50% or greater) or occlusion. Skeleton: Negative Other neck: No masses. No lung apex lesion. Mild apical pleural thickening. IMPRESSION: Normal RIGHT anterior circulation. Non stenotic calcific and soft plaque at the LEFT ICA origin, but no flow-limiting stenosis at that level nor is there strong evidence for LEFT ICA dissection. The cervical LEFT ICA becomes diffusely small but is patent at the C2 level extending Cephalad to the skullbase. This is favored to represent luminal narrowing due to severe intracranial stenosis, involving the LICA cavernous or supraclinoid level, or both. Electronically Signed   By: Elsie StainJohn T Curnes M.D.   On: 12/11/2015 17:28   Mr Brain Wo Contrast  12/11/2015  CLINICAL DATA:  Patient with atrial fibrillation not on long-term anticoagulation, also end-stage renal disease, hypertension, and diabetes, presents with disorder of speech 12/10/2015. Also Syncopal episode. EXAM: MRI HEAD WITHOUT CONTRAST MRA HEAD WITHOUT CONTRAST TECHNIQUE: Multiplanar, multiecho pulse sequences of the brain and surrounding structures were obtained without intravenous contrast. Angiographic images of the head were obtained using MRA technique without contrast. COMPARISON:  MR head 11/17/2015.  CT head 12/10/2015. FINDINGS: MRI HEAD FINDINGS Multifocal areas of acute infarction affect the LEFT hemisphere, including LEFT frontal, temporal, parietal, and occipital lobes, LEFT caudate nucleus and lentiform nucleus, and regional subcortical greater than periventricular white matter. No posterior fossa involvement. No RIGHT hemisphere involvement. These could be related to multiple emboli from the diseased LEFT ICA described below, or watershed  type phenomenon from hypoperfusion. Normal for age cerebral volume. Paucity of white matter disease given patient's comorbidities. Remote RIGHT posterior frontal and subcortical infarction. No significant chronic hemorrhage. No midline abnormality. Extracranial soft tissues unremarkable. No osseous findings MRA HEAD FINDINGS The RIGHT internal carotid artery is widely patent. The basilar artery is widely patent with RIGHT vertebral dominant. There is a 50% stenosis of the non dominant LEFT vertebral at its junction with basilar. The RIGHT M1 MCA is widely patent. Minor non stenotic narrowing of the proximal RIGHT A1 ACA. The anterior communicating artery poorly seen. There is apparent cross fill right-to-left supplying the anterior circulation. There is some flow related enhancement of the LEFT ICA in its cavernous and supraclinoid segment, but there is diminished to absent petrous LEFT ICA flow. Tapering flow related enhancement  is observed in a small but patent LEFT cervical ICA. LEFT M1 MCA small but patent. Severe stenosis proximal A2 LEFT ACA. Severely diminished flow related enhancement in the LEFT M2 and M3 vessels. No PCA stenosis or occlusion. No cerebellar branch occlusion. No intracranial aneurysm. Compared with the prior MRI from 11/17/2015, the infarctions are new, and the LEFT ICA appears to be further diminished caliber. IMPRESSION: Multifocal areas of acute infarction affecting the LEFT hemisphere without visible hemorrhage. It is unclear if these represent multiple emboli or watershed type phenomenon. No posterior fossa or RIGHT hemisphere involvement. Segmental areas of diminished or absent flow related enhancement in the LEFT ICA. String sign is suspected. Formal catheter angiogram recommended for further evaluation to potentially re-establish flow in a diseased vessel. Critical Value/emergent results were called by telephone at the time of interpretation on 12/11/2015 at 1:49 pm to Dr. Nemiah Commander  , who verbally acknowledged these results. Electronically Signed   By: Elsie Stain M.D.   On: 12/11/2015 13:51   Mr Maxine Glenn Head/brain Wo Cm  12/11/2015  CLINICAL DATA:  Patient with atrial fibrillation not on long-term anticoagulation, also end-stage renal disease, hypertension, and diabetes, presents with disorder of speech 12/10/2015. Also Syncopal episode. EXAM: MRI HEAD WITHOUT CONTRAST MRA HEAD WITHOUT CONTRAST TECHNIQUE: Multiplanar, multiecho pulse sequences of the brain and surrounding structures were obtained without intravenous contrast. Angiographic images of the head were obtained using MRA technique without contrast. COMPARISON:  MR head 11/17/2015.  CT head 12/10/2015. FINDINGS: MRI HEAD FINDINGS Multifocal areas of acute infarction affect the LEFT hemisphere, including LEFT frontal, temporal, parietal, and occipital lobes, LEFT caudate nucleus and lentiform nucleus, and regional subcortical greater than periventricular white matter. No posterior fossa involvement. No RIGHT hemisphere involvement. These could be related to multiple emboli from the diseased LEFT ICA described below, or watershed type phenomenon from hypoperfusion. Normal for age cerebral volume. Paucity of white matter disease given patient's comorbidities. Remote RIGHT posterior frontal and subcortical infarction. No significant chronic hemorrhage. No midline abnormality. Extracranial soft tissues unremarkable. No osseous findings MRA HEAD FINDINGS The RIGHT internal carotid artery is widely patent. The basilar artery is widely patent with RIGHT vertebral dominant. There is a 50% stenosis of the non dominant LEFT vertebral at its junction with basilar. The RIGHT M1 MCA is widely patent. Minor non stenotic narrowing of the proximal RIGHT A1 ACA. The anterior communicating artery poorly seen. There is apparent cross fill right-to-left supplying the anterior circulation. There is some flow related enhancement of the LEFT ICA in its  cavernous and supraclinoid segment, but there is diminished to absent petrous LEFT ICA flow. Tapering flow related enhancement is observed in a small but patent LEFT cervical ICA. LEFT M1 MCA small but patent. Severe stenosis proximal A2 LEFT ACA. Severely diminished flow related enhancement in the LEFT M2 and M3 vessels. No PCA stenosis or occlusion. No cerebellar branch occlusion. No intracranial aneurysm. Compared with the prior MRI from 11/17/2015, the infarctions are new, and the LEFT ICA appears to be further diminished caliber. IMPRESSION: Multifocal areas of acute infarction affecting the LEFT hemisphere without visible hemorrhage. It is unclear if these represent multiple emboli or watershed type phenomenon. No posterior fossa or RIGHT hemisphere involvement. Segmental areas of diminished or absent flow related enhancement in the LEFT ICA. String sign is suspected. Formal catheter angiogram recommended for further evaluation to potentially re-establish flow in a diseased vessel. Critical Value/emergent results were called by telephone at the time of interpretation on 12/11/2015 at 1:49  pm to Dr. Nemiah Commander , who verbally acknowledged these results. Electronically Signed   By: Elsie Stain M.D.   On: 12/11/2015 13:51    Micro Results     Recent Results (from the past 240 hour(s))  MRSA PCR Screening     Status: None   Collection Time: 12/11/15  8:19 PM  Result Value Ref Range Status   MRSA by PCR NEGATIVE NEGATIVE Final    Comment:        The GeneXpert MRSA Assay (FDA approved for NASAL specimens only), is one component of a comprehensive MRSA colonization surveillance program. It is not intended to diagnose MRSA infection nor to guide or monitor treatment for MRSA infections.   Body fluid culture     Status: None (Preliminary result)   Collection Time: 12/15/15  9:00 AM  Result Value Ref Range Status   Specimen Description ABDOMEN  Final   Special Requests Immunocompromised  Final    Gram Stain NO WBC SEEN NO ORGANISMS SEEN   Final   Culture NO GROWTH 3 DAYS  Final   Report Status PENDING  Incomplete       Today   Subjective:   Kevin Cancel today , has right-sided hemiplegia, expressive aphagia. Had low-grade temp. But no chest pain, no shortness of breath, no nausea, no vomiting, no diarrhea. Stable for going to inpatient rehabilitation when bed is available.  Objective:   Blood pressure 112/84, pulse 91, temperature 97.5 F (36.4 C), temperature source Oral, resp. rate 19, height 5\' 4"  (1.626 m), weight 44.8 kg (98 lb 12.3 oz), SpO2 100 %.   Intake/Output Summary (Last 24 hours) at 12/19/15 1025 Last data filed at 12/19/15 0900  Gross per 24 hour  Intake    360 ml  Output      0 ml  Net    360 ml    Exam Awake Alert, Oriented x 3, [place].AT,PERRAL Supple Neck,No JVD, No cervical lymphadenopathy appriciated.  Symmetrical Chest wall movement, Good air movement bilaterally, CTAB RRR,No Gallops,Rubs or new Murmurs, No Parasternal Heave +ve B.Sounds, Abd Soft, Non tender, No organomegaly appriciated, No rebound -guarding or rigidity. No Cyanosis, Clubbing or edema, No new Rash or bruise Right sideweakness secondary to stroke, does have some expressive aphasia. Data Review   CBC w Diff:  Lab Results  Component Value Date   WBC 5.7 12/19/2015   WBC 7.7 11/15/2014   WBC 7.7 11/15/2014   HGB 10.7* 12/19/2015   HGB 10.2* 11/15/2014   HCT 32.8* 12/19/2015   HCT 31.6* 11/15/2014   PLT 137* 12/19/2015   PLT 117* 11/15/2014   LYMPHOPCT 10 12/11/2015   LYMPHOPCT 6.1 11/13/2014   MONOPCT 8 12/11/2015   MONOPCT 6.1 11/13/2014   EOSPCT 0 12/11/2015   EOSPCT 0.0 11/13/2014   BASOPCT 1 12/11/2015   BASOPCT 0.2 11/13/2014    CMP:  Lab Results  Component Value Date   NA 135 12/12/2015   NA 135* 11/15/2014   NA 135* 11/15/2014   K 3.5 12/12/2015   K 3.7 11/15/2014   CL 99* 12/12/2015   CL 102 11/15/2014   CO2 21* 12/12/2015   CO2 16*  11/15/2014   BUN 62* 12/12/2015   BUN 75* 11/15/2014   BUN 75* 11/15/2014   CREATININE 9.59* 12/12/2015   CREATININE 8.87* 11/15/2014   CREATININE 8.9* 11/15/2014   GLU 138 11/15/2014   PROT 6.2* 12/10/2015   PROT 7.1 11/07/2014   ALBUMIN 3.0* 12/10/2015   ALBUMIN 2.8* 11/07/2014  BILITOT 0.9 12/10/2015   BILITOT 0.5 11/07/2014   ALKPHOS 104 12/10/2015   ALKPHOS 134* 11/07/2014   AST 23 12/10/2015   AST 34 11/07/2014   ALT 14 12/10/2015   ALT 24 11/07/2014  .   Total Time in preparing paper work, data evaluation and todays exam - 35 minutes  Raquelle Pietro M.D on 12/19/2015 at 10:25 AM    Note: This dictation was prepared with Dragon dictation along with smaller phrase technology. Any transcriptional errors that result from this process are unintentional.

## 2015-12-19 NOTE — Progress Notes (Signed)
Physical Therapy Treatment Patient Details Name: Dorothy Long MRN: [id number] DOB: 03/25/1961 Today's Date: 12/19/2015    History of Present Illness presented to ER after being found on floor by husband, + LOC, noted with impaired speech and R-sided weakness upon awakening; admitted for acute CVA.  MRI significant for multifocal infarcts throughout L hemisphere.  Of note, patient with elevated troponin (peak 4.79), likely demand ischemia due to acute CVA per cardiology; no invasive work-up recommneded at this time.    PT Comments    Good response to R LE NDT theraband wrapping--improved control/mechanics of R LE noted with all gait efforts.  Fair/good use of L UE loftstrand; will continue use in subsequent sessions.   Follow Up Recommendations  CIR     Equipment Recommendations  None recommended by PT    Recommendations for Other Services       Precautions / Restrictions Precautions Precautions: Fall Precaution Comments: Seizure, L AVF (No BP L UE), Tenkoff catheter (PD) Restrictions Weight Bearing Restrictions: No    Mobility  Bed Mobility Overal bed mobility: Needs Assistance Bed Mobility: Supine to Sit     Supine to sit: Min assist     General bed mobility comments: assist for position, protection and movement of R hemi-body.  Once upright, maintains sitting balance with close sup, fair/good symmetry  Transfers Overall transfer level: Needs assistance     Sit to Stand: Min assist;Mod assist         General transfer comment: assist for R LE position and active use during movement transitions  Ambulation/Gait Ambulation/Gait assistance: Mod assist;+2 physical assistance Ambulation Distance (Feet): 30 Feet Assistive device:  (loftstrand)       General Gait Details: with application of NDT theraband to R LE, patient able to indep advance R LE 90% gait cycle.  Unable to achieve full TKE with resistance, but able to support body weight without buckling.   continues to require manual faciltiation for L ant/lateral weight shift and closure of R lateral trunk throughout gait cycle.  Fair sequencing/use of assist device; will continue efforts in subsequent sessions.   Stairs            Wheelchair Mobility    Modified Rankin (Stroke Patients Only)       Balance                                    Cognition Arousal/Alertness: Awake/alert Behavior During Therapy: WFL for tasks assessed/performed;Flat affect Overall Cognitive Status: Within Functional Limits for tasks assessed                 General Comments: expressive > receptive aphasia; consistently follows simple commands; improving spontaneous verbalization    Exercises Other Exercises Other Exercises: Unsupported sitting edge of bed, participated with upper/lower body dressing.  Mod/max assist for both upper and lower body dressing; limited awareness/integration of hemi-dressing techniques. Other Exercises: Standing balance at sink for oral hygiene and light grooming tasks, min/mod assist--encouraged to utilize R UE to grasp items, L UE to manipulate tops/containers.  Frequent cuing for active integration of R UE as able.    General Comments        Pertinent Vitals/Pain Pain Assessment: No/denies pain    Home Living                      Prior Function  PT Goals (current goals can now be found in the care plan section) Acute Rehab PT Goals Patient Stated Goal: OK PT Goal Formulation: With patient/family Time For Goal Achievement: 12/26/15 Potential to Achieve Goals: Good Progress towards PT goals: Progressing toward goals    Frequency  7X/week    PT Plan Current plan remains appropriate    Co-evaluation             End of Session Equipment Utilized During Treatment: Gait belt Activity Tolerance: Patient tolerated treatment well Patient left: in chair;with call bell/phone within reach;with chair alarm set      Time: [id number] PT Time Calculation (min) (ACUTE ONLY): 29 min  Charges:  $Gait Training: 8-22 mins $Neuromuscular Re-education: 8-22 mins                    G Codes:      Shawnte Winton H. Manson PasseyBrown, PT, DPT, NCS 12/19/2015, 11:28 AM [phone]

## 2015-12-19 NOTE — Progress Notes (Signed)
PD tx ended. Pt taken off cycler.  Vitals stable, Temp 97.5, Pulse 76, Resp 18, B/P 112/84.  Total UF removed , effluent clear with no fibrin noted. Dsg c/d/i, disconnect cap installed.  Pt stable.

## 2015-12-19 NOTE — Care Management Important Message (Signed)
Important Message  Patient Details  Name: Dorothy Long MRN: [id number] Date of Birth: 1961/04/09   Medicare Important Message Given:  Yes    Olegario MessierKathy A Lidia Clavijo 12/19/2015, 9:54 AM

## 2015-12-19 NOTE — Plan of Care (Signed)
Problem: Education: Goal: Knowledge of disease or condition will improve Outcome: Progressing Pt receiving peritoneal dialysis in the evenings.  She is accepting of her treatments and very pleasant. Pt received pain meds once for abdominal pain. vss

## 2015-12-19 NOTE — Progress Notes (Signed)
VSS. Denies pain. Pt is discharged to inpatient rehab. Discharge packet in pt's slot. Educational handouts about stroke symptoms and prevention given and explained to pt. Awaiting [facility] for transportation

## 2015-12-19 NOTE — Progress Notes (Signed)
[facility] [place] [facility]   PATIENT NAME: Dorothy CaffeyDonnella Long    MR#:  [id number]  DATE OF BIRTH:  04-Jun-1961  SUBJECTIVE: She is admitted for altered mental status. Right-sided weakness found to have multiple infarcts on the left side. Patient is seen today. Awake alert oriented. Right-sided weakness is slightly better. [facility]. Has some cough,low grade temp.check chest xray.  .   CHIEF COMPLAINT:   Chief Complaint  Patient presents with  . Altered Mental Status   - Patient admitted secondary to altered mental status. Aphasic now, also has right-sided weakness. -At baseline, patient is very communicative, alert and ambulates with a walker  REVIEW OF SYSTEMS:  Review of Systems  Constitutional: Negative for fever and chills.  HENT: Negative for hearing loss.   Eyes: Negative for blurred vision, double vision and photophobia.  Respiratory: Negative for cough, hemoptysis and shortness of breath.   Cardiovascular: Negative for palpitations, orthopnea and leg swelling.  Gastrointestinal: Negative for vomiting, abdominal pain and diarrhea.  Genitourinary: Negative for dysuria and urgency.  Musculoskeletal: Negative for myalgias and neck pain.  Skin: Negative for rash.  Neurological: Positive for speech change and focal weakness. Negative for dizziness, seizures, weakness and headaches.       Right-sided hemiplegia, slurred speech noted.  Psychiatric/Behavioral: Negative for memory loss. The patient does not have insomnia.   Dysarthria  DRUG ALLERGIES:   Allergies  Allergen Reactions  . Ciprocinonide [Fluocinolone] Hives  . Demerol [Meperidine] Nausea And Vomiting  . Diflucan [Fluconazole] Nausea Only  . Flagyl [Metronidazole] Hives and Itching  . Hydrocodone Itching  . Levaquin [Levofloxacin In D5w] Other (See Comments)    Numbness in legs  . Morphine And Related Nausea And Vomiting  .  Tetracyclines & Related Itching and Swelling  . Valium [Diazepam] Nausea Only    hallucinations    VITALS:  Blood pressure 112/84, pulse 91, temperature 97.5 F (36.4 C), temperature source Oral, resp. rate 19, height 5\' 4"  (1.626 m), weight 44.8 kg (98 lb 12.3 oz), SpO2 100 %.  PHYSICAL EXAMINATION:  Physical Exam  Constitutional: She is oriented to person, place, and time and well-developed, well-nourished, and in no distress.  HENT:  Head: Normocephalic.  Right Ear: External ear normal.  Mouth/Throat: Oropharynx is clear and moist.  Eyes: Conjunctivae and EOM are normal. Pupils are equal, round, and reactive to light.  Neck: Neck supple. No tracheal deviation present. No thyromegaly present.  Cardiovascular: Normal rate, regular rhythm and normal heart sounds.  Exam reveals no gallop.   No murmur heard. Pulmonary/Chest: She has no wheezes. She has no rales.  Abdominal: She exhibits no distension. There is no tenderness. There is no rebound.  Musculoskeletal: Normal range of motion.  Lymphadenopathy:    She has no cervical adenopathy.  Neurological: She is alert and oriented to person, place, and time. Coordination normal.  Overall the right-sided weakness on the upper and lower extremities. It is 2/5.  Skin: Skin is dry.  Psychiatric: Memory and affect normal.      LABORATORY PANEL:   CBC  Recent Labs Lab 12/19/15 0453  WBC 5.7  HGB 10.7*  HCT 32.8*  PLT 137*   ------------------------------------------------------------------------------------------------------------------  Chemistries  No results for input(s): NA, K, CL, CO2, GLUCOSE, BUN, CREATININE, CALCIUM, MG, AST, ALT, ALKPHOS, BILITOT in the last 168 hours.  Invalid input(s): GFRCGP ------------------------------------------------------------------------------------------------------------------  Cardiac Enzymes No results for input(s): TROPONINI in the last 168  hours. ------------------------------------------------------------------------------------------------------------------  RADIOLOGY:  Dg Chest 1 View  12/18/2015  CLINICAL DATA:  Fever today. History of hypertension, hyperlipidemia, COPD, end-stage renal disease on peritoneal dialysis, congestive heart failure, anemia. EXAM: CHEST 1 VIEW COMPARISON:  11/17/2015 FINDINGS: Cardiac silhouette is mild-to-moderately enlarged. No mediastinal or hilar masses or evidence of adenopathy. Mild hazy opacity at the right lung base likely a combination of a small effusion and atelectasis. Pneumonia is possible. Remainder of the lungs is clear. No evidence of a left pleural effusion. No pneumothorax. Lungs are hyperexpanded. Bony thorax is grossly intact. IMPRESSION: 1. Right lung base opacity likely combination of a small effusion and atelectasis. Pneumonia is possible. 2. No other evidence of acute cardiopulmonary disease. Stable cardiomegaly. Electronically Signed   By: Amie Portland M.D.   On: 12/18/2015 10:53    EKG:   Orders placed or performed during the hospital encounter of 12/10/15  . ED EKG  . ED EKG  . EKG 12-Lead  . EKG 12-Lead  . EKG 12-Lead  . EKG 12-Lead    ASSESSMENT AND PLAN:   55 year old female with multiple medical problems including COPD, end-stage renal disease on peritoneal dialysis, hypertension, hyperlipidemia, congestive heart failure with systolic dysfunction, chronic anemia, history of atrial fibrillation, diabetes mellitus presents to the hospital secondary to change in mental status, speech and RIGHT-sided weakness.  #1 multifocal stroke: MRI of the brain showed a left MCA, PCA, MCA territories. Patient had a CT angiogram of the neck did not show any significant stenosis that needs to be operated. Continue aspirin, Plavix. Statins.  Patient needs outpatient 30 day loop moniotr evaluation of atrial fibrillation. Continue dysphagia 3 diet.  appreciate Vascular, and neurology  evaluations. Physical therapy recommends inpatient rehabilitation. Case management is working on getting of bed at [facility]. [facility] want to see how patient progresses over the weekend.   -Most recent echo 3 weeks ago with no clot. - #2 elevated troponin-possibly demand ischemia. Patient denies any chest pain. - Cardiology is  following patient needs outpatient TEE . #3 history of paroxysmal atrial fibrillation with 17 beats of nonsustained V. tach on telemetry. -Appreciate cardiology consult. -Metoprolol discontinued for now due to acute stroke and permissive hypertension requested Patient needs outpatient TEE.  #4  end-stage renal disease on peritoneal dialysis-also has hemodialysis catheter. Prior history of peritonitis and abdominal hernias. -Nephrology consulted..  Continue peritoneal dialysis at this time..  #5 anemia of chronic disease-stable. -With acute stroke, likely not a candidate for Procrit  #6 hypokalemia-per nephrology.  #7 tobacco use disorder-smoking cessation advised  # 8 DVT prophylaxis-on subcutaneous heparin Downgraded to regular stroke unit.  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 Cough/low grade tmep;check chest xray Stable to  Go to [facility] rehab, All the records are reviewed and case discussed with Care Management/Social Workerr. Management plans discussed with the patient, family and they are in agreement.  CODE STATUS: Full Code  TOTAL TIME SPENT IN TAKING CARE OF THIS PATIENT: 45 minutes.   POSSIBLE D/C IN 2-3 DAYS, DEPENDING ON CLINICAL CONDITION.   Katha Hamming M.D on 12/19/2015 at 10:31 AM  Between 7am to 6pm - Pager - [phone]  After 6pm go to [email or web address] - password [email or web address] [facility]  [facility]  Office  [phone]  CC: Primary care physician; Sherlene Shams, MD

## 2015-12-19 NOTE — Progress Notes (Signed)
[facility]  ROUNDING NOTE   Subjective:   Peritoneal dialysis last night. Tolerated treatment well. UF of  Husband at bedside. Schedule to be transferred to inpatient rehab later today.   Objective:  Vital signs in last 24 hours:  Temp:  [97.5 F (36.4 C)-98.2 F (36.8 C)] 97.5 F (36.4 C) (01/04 0509) Pulse Rate:  [52-91] 91 (01/04 0509) Resp:  [18-22] 19 (01/04 0509) BP: (112-133)/(84-90) 112/84 mmHg (01/04 0509) SpO2:  [98 %-100 %] 100 % (01/04 0509) Weight:  [44.8 kg (98 lb 12.3 oz)] 44.8 kg (98 lb 12.3 oz) (01/04 0627)  Weight change: -2.056 kg (-4 lb 8.5 oz) Filed Weights   12/17/15 0500 12/17/15 0930 12/19/15 0627  Weight: 51.755 kg (114 lb 1.6 oz) 46.857 kg (103 lb 4.8 oz) 44.8 kg (98 lb 12.3 oz)    Intake/Output: I/O last 3 completed shifts: In: 240 [P.O.:240] Out: 540 [Other:540]   Intake/Output this shift:  Total I/O In: 120 [P.O.:120] Out: -   Physical Exam: General: NAD, sitting in bed  Head: Normocephalic, atraumatic. Moist oral mucosal membranes  Eyes: Anicteric  Neck: Supple, trachea midline  Lungs:  Clear to auscultation normal effort  Heart: S1S2 no rubs  Abdomen:  Soft, nontender, BS present   Extremities: no peripheral edema.  Neurologic: Right upper extremity strength 4/5, right lower extremity 3/5  Skin: No lesions  Access: PD catheter in place    Basic Metabolic Panel:  Recent Labs Lab 12/13/15 1257  PHOS 9.3*    Liver Function Tests: No results for input(s): AST, ALT, ALKPHOS, BILITOT, PROT, ALBUMIN in the last 168 hours. No results for input(s): LIPASE, AMYLASE in the last 168 hours. No results for input(s): AMMONIA in the last 168 hours.  CBC:  Recent Labs Lab 12/14/15 0726 12/19/15 0453  WBC 4.8 5.7  HGB 8.9* 10.7*  HCT 27.7* 32.8*  MCV 86.7 85.8  PLT 126* 137*    Cardiac Enzymes: No results for input(s): CKTOTAL, CKMB, CKMBINDEX, TROPONINI in the last 168 hours.  BNP: Invalid input(s):  POCBNP  CBG: No results for input(s): GLUCAP in the last 168 hours.  Microbiology: Results for orders placed or performed during the hospital encounter of 12/10/15  MRSA PCR Screening     Status: None   Collection Time: 12/11/15  8:19 PM  Result Value Ref Range Status   MRSA by PCR NEGATIVE NEGATIVE Final    Comment:        The GeneXpert MRSA Assay (FDA approved for NASAL specimens only), is one component of a comprehensive MRSA colonization surveillance program. It is not intended to diagnose MRSA infection nor to guide or monitor treatment for MRSA infections.   Body fluid culture     Status: None   Collection Time: 12/15/15  9:00 AM  Result Value Ref Range Status   Specimen Description ABDOMEN  Final   Special Requests Immunocompromised  Final   Gram Stain NO WBC SEEN NO ORGANISMS SEEN   Final   Culture No growth aerobically or anaerobically.  Final   Report Status 12/19/2015 FINAL  Final    Coagulation Studies: No results for input(s): LABPROT, INR in the last 72 hours.  Urinalysis: No results for input(s): COLORURINE, LABSPEC, PHURINE, GLUCOSEU, HGBUR, BILIRUBINUR, KETONESUR, PROTEINUR, UROBILINOGEN, NITRITE, LEUKOCYTESUR in the last 72 hours.  Invalid input(s): APPERANCEUR    Imaging: Dg Chest 1 View  12/18/2015  CLINICAL DATA:  Fever today. History of hypertension, hyperlipidemia, COPD, end-stage renal disease on peritoneal dialysis, congestive  heart failure, anemia. EXAM: CHEST 1 VIEW COMPARISON:  11/17/2015 FINDINGS: Cardiac silhouette is mild-to-moderately enlarged. No mediastinal or hilar masses or evidence of adenopathy. Mild hazy opacity at the right lung base likely a combination of a small effusion and atelectasis. Pneumonia is possible. Remainder of the lungs is clear. No evidence of a left pleural effusion. No pneumothorax. Lungs are hyperexpanded. Bony thorax is grossly intact. IMPRESSION: 1. Right lung base opacity likely combination of a small effusion  and atelectasis. Pneumonia is possible. 2. No other evidence of acute cardiopulmonary disease. Stable cardiomegaly. Electronically Signed   By: Amie Portlandavid  Ormond M.D.   On: 12/18/2015 10:53     Medications:     . aspirin EC  81 mg Oral Daily  . atorvastatin  20 mg Oral Daily  . calcium acetate  667 mg Oral TID  . clopidogrel  75 mg Oral Daily  . dialysis solution 1.5% low-MG/low-CA   Intraperitoneal Q24H  . feeding supplement (ENSURE ENLIVE)  237 mL Oral BID BM  . gentamicin cream  1 application Topical Daily  . heparin  5,000 Units Subcutaneous 3 times per day  . sevelamer carbonate  1,600 mg Oral TID WC  . sodium chloride  3 mL Intravenous Q12H  . tiotropium  18 mcg Inhalation Daily   acetaminophen **OR** acetaminophen, albuterol, ALPRAZolam, morphine injection, ondansetron (ZOFRAN) IV, senna-docusate  Assessment/ Plan:  55 y.o. female with PMHx of of hypertension, hyperlipidemia, vitamin D deficiency, tobacco abuse, edema, cholecystectomy,hysterectomy, ESRD secondary to FSGS, SHPTH, severe mitral stenosis, Liver cirrhosis, inguinal hernia repair.  ADmitted 12/10/15 with fall from home and found to have multiple acute areas of infarction in the left cerebral hemisphere.  1.  End-stage renal disease on peritoneal dialysis.  Continue current prescription of peritoneal dialysis - gentamicin to exit site  2.  Anemia chronic kidney disease.  Hemoglobin 10.7 - avoiding Epogen given multiple areas of cerebral infarction.  3.  Secondary hyperparathyroidism.  Phos 9.3. History of noncompliance with binders.  - calcium acetate, sevelamer with meals  4.  Acute CVA.  Multiple areas of acute infarction involving the left hemisphere . - appreciate neurology input.  - Continue to work with PT/OT   5. Hypertension: will allow to rise due to recent ischemic event. Currently not on any agents. Blood pressure currently at goal.   LOS: 8 Netanya Yazdani 1/4/201712:16 PM

## 2015-12-19 NOTE — Care Management (Signed)
Received telephone call from Josefa HalfJanelle Green, representative for Inpatient Acute Rehabilitation in [place]. Will accept Ms. Mckelvy into their facility today. States she spoke with Mr. [name] yesterday. Discussed that [employer] would only pay for  inpatient acute rehabilitation or skilled nursing, not both.  Hus[facility] Dr. Windle GuardKimberly Rauch is the accepting physician. Report should be called to [phone]. Room # 7305 Bed#1  [facility] will transport. Dr. Carrolyn LeighKondenia updated. Gwenette GreetBrenda S Oluwaseyi Tull RN MSN CCM Care Management [phone]

## 2015-12-20 ENCOUNTER — Telehealth: Payer: Self-pay

## 2015-12-20 ENCOUNTER — Ambulatory Visit: Payer: BC Managed Care – PPO | Admitting: Internal Medicine

## 2015-12-20 NOTE — Telephone Encounter (Signed)
Unable to reach patient.  Discharge reads stable for inpatient rehab.  Will continue to follow.

## 2015-12-21 ENCOUNTER — Telehealth: Payer: Self-pay

## 2015-12-21 NOTE — Telephone Encounter (Signed)
Called to follow up with transitional care.  [facility] per husband Karren Burly([place]) and will call for follow up appointment when discharged home.

## 2016-01-25 ENCOUNTER — Ambulatory Visit: Payer: BC Managed Care – PPO | Admitting: Cardiovascular Disease

## 2016-01-25 ENCOUNTER — Encounter: Payer: Self-pay | Admitting: *Deleted

## 2016-01-26 IMAGING — MR MR HEAD W/O CM
10 series · 48 of 48 positions shown · non-contrast
Comparison: CT head earlier today.

CLINICAL DATA: Patient became lethargic after house chores.
Lethargy and confusion and dizziness. Syncopal episode. End-stage
renal disease.

EXAM:
MRI HEAD WITHOUT CONTRAST
TECHNIQUE: Multiplanar, multiecho pulse sequences of the brain and surrounding
structures were obtained without intravenous contrast.

[Series 2: GRE · sagittal · 5.0mm · 0.45mm/px · 4 of 25 slices shown (1 of 2)]
[im 1/25]
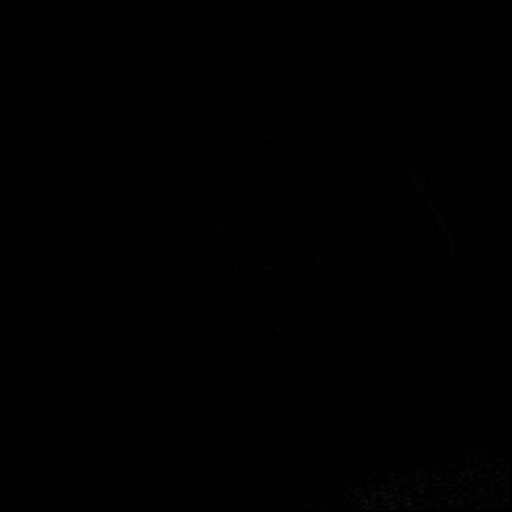
[im 9/25]
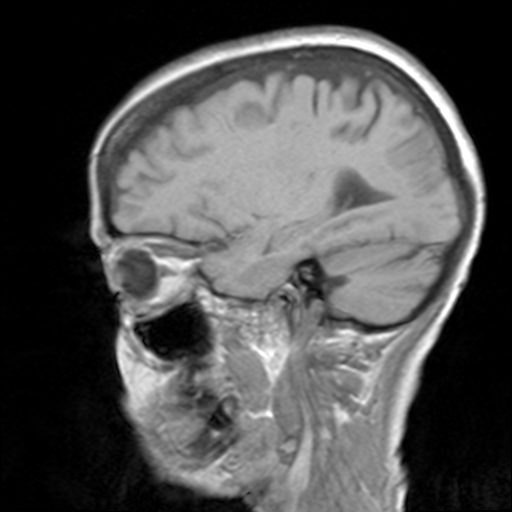
[im 17/25]
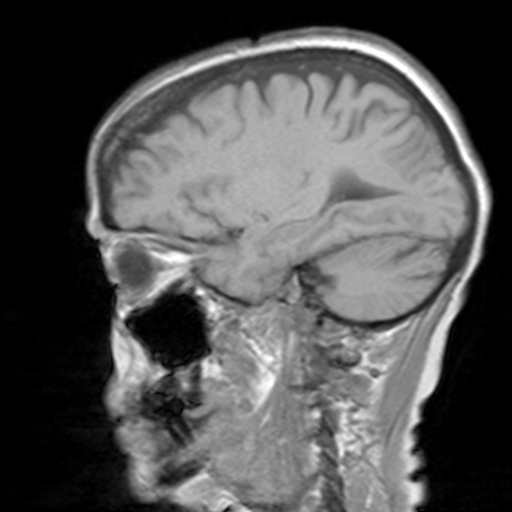
[im 25/25]
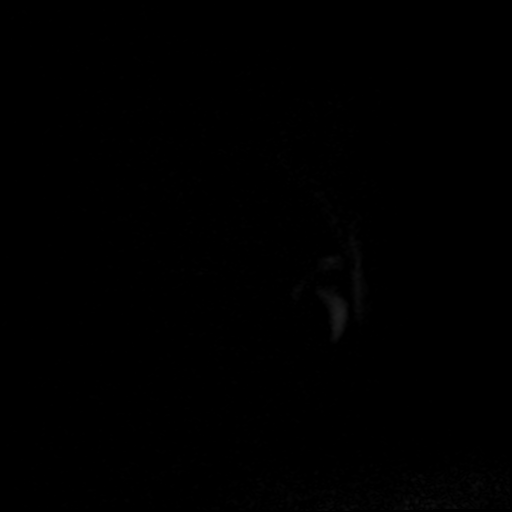

[Series 4: DWI · axial · 3.0mm · 1.80mm/px · z∈[-66,+96]mm · 7 of 47 slices shown (1 of 4)]
[im 1/47]
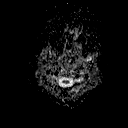
[im 8/47]
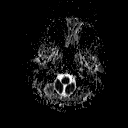
[im 16/47]
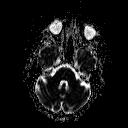
[im 24/47]
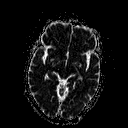
[im 31/47]
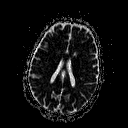
[im 39/47]
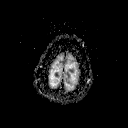
[im 47/47]
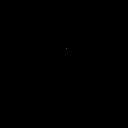

[Series 6: DWI · coronal · 3.0mm · 1.80mm/px · 7 of 47 slices shown (2 of 4)]
[im 1/47]
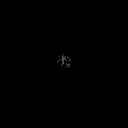
[im 8/47]
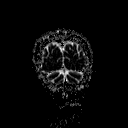
[im 16/47]
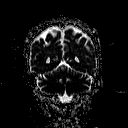
[im 24/47]
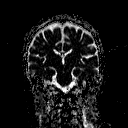
[im 31/47]
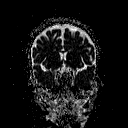
[im 39/47]
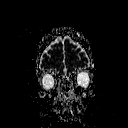
[im 47/47]
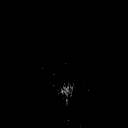

[Series 7: T2 · axial · 5.0mm · 0.45mm/px · z∈[-63,+93]mm · 3 of 23 slices shown (1 of 3)]
[im 1/23]
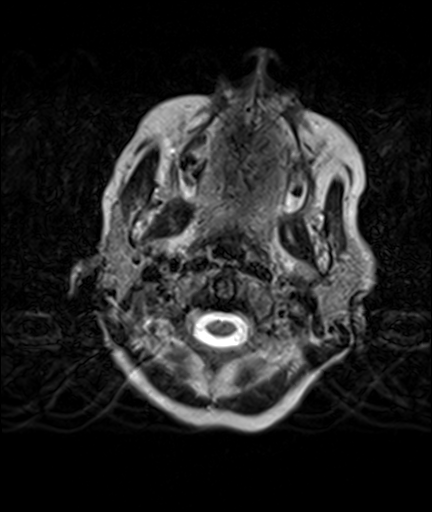
[im 12/23]
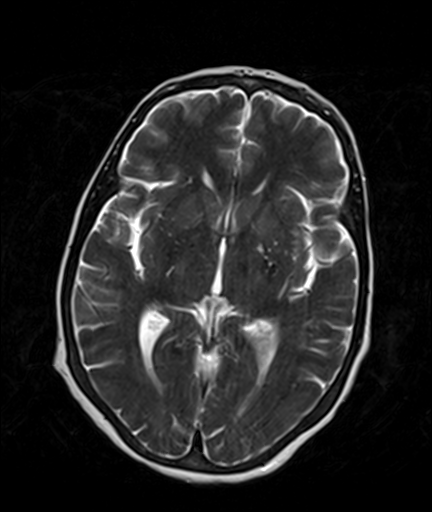
[im 23/23]
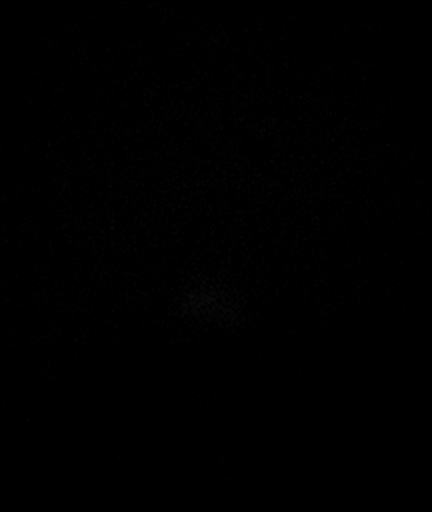

[Series 8: FLAIR · axial · 5.0mm · 0.45mm/px · z∈[-63,+93]mm · 3 of 25 slices shown]
[im 1/25]
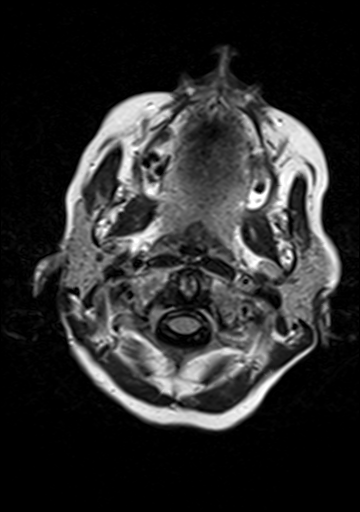
[im 13/25]
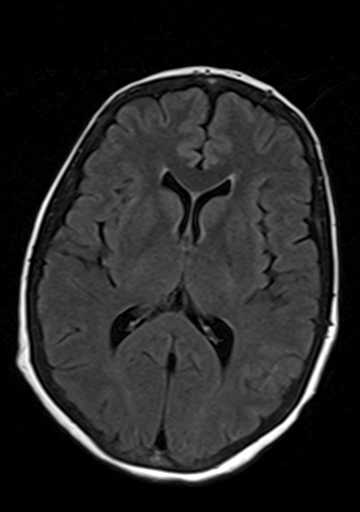
[im 25/25]
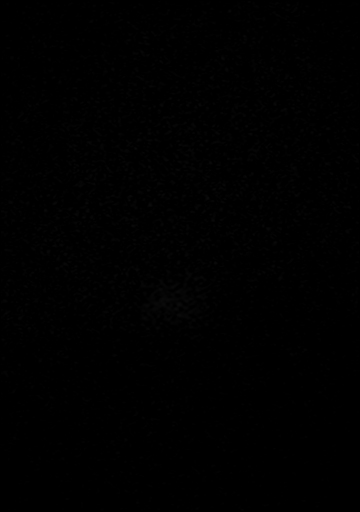

[Series 9: T2 · axial · 5.0mm · 1.20mm/px · z∈[-63,+93]mm · 3 of 25 slices shown (2 of 3)]
[im 1/25]
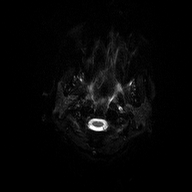
[im 13/25]
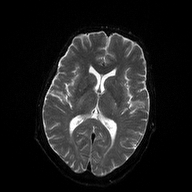
[im 25/25]
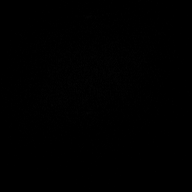

[Series 10: GRE · axial · 5.0mm · 0.45mm/px · z∈[-63,+93]mm · 3 of 25 slices shown (2 of 2)]
[im 1/25]
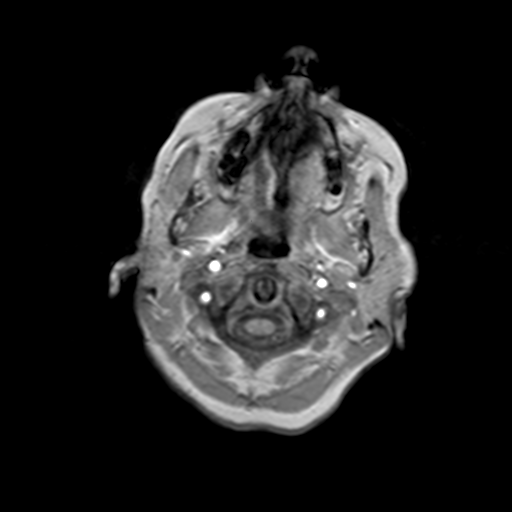
[im 13/25]
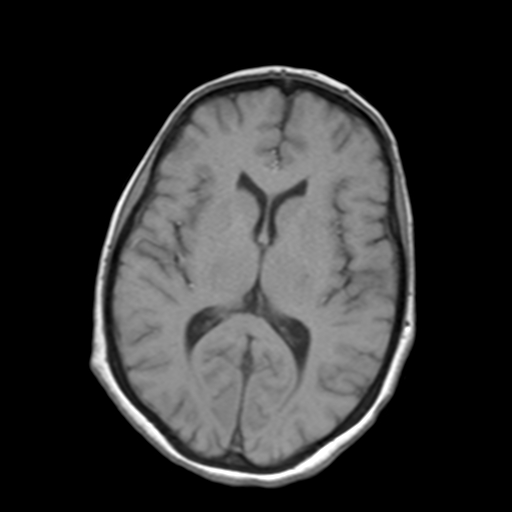
[im 25/25]
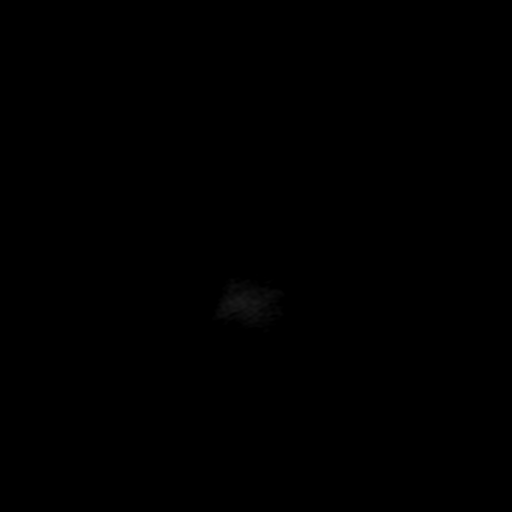

[Series 11: T2 · coronal · 5.0mm · 0.45mm/px · 4 of 31 slices shown (3 of 3)]
[im 1/31]
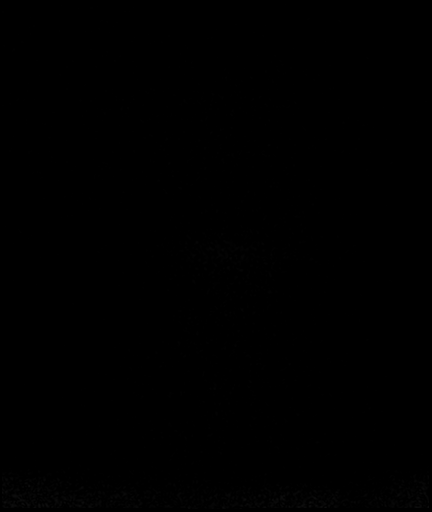
[im 11/31]
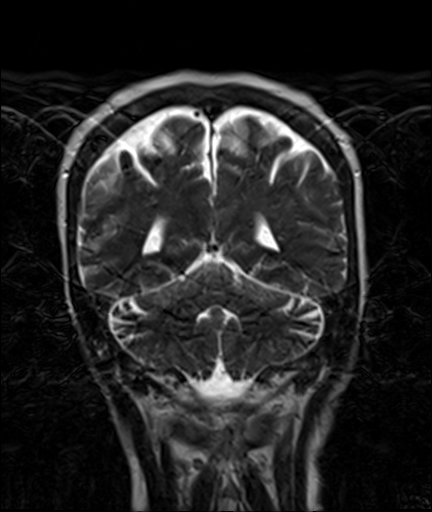
[im 21/31]
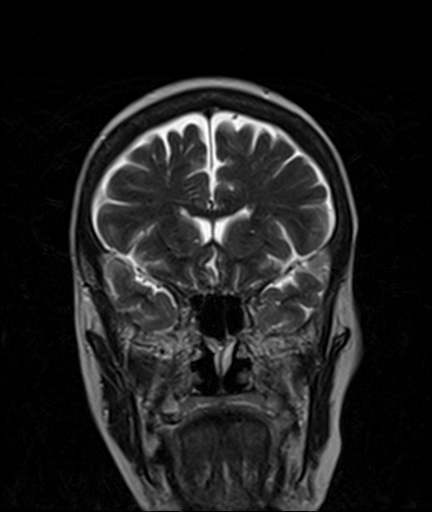
[im 31/31]
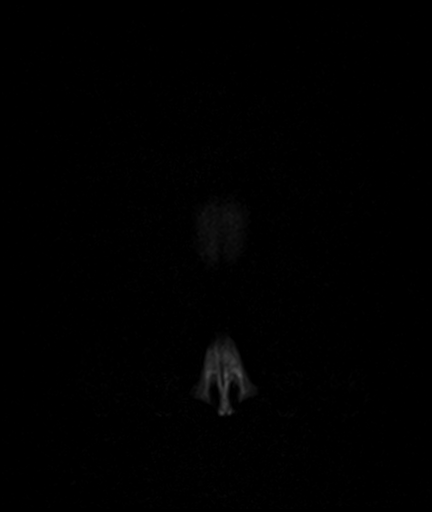

[Series 100: DWI · axial · 3.0mm · 1.80mm/px · z∈[-66,+93]mm · 7 of 54 slices shown (3 of 4)]
[im 1/54]
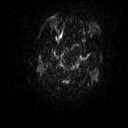
[im 9/54]
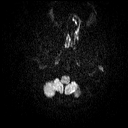
[im 18/54]
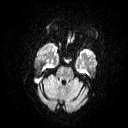
[im 27/54]
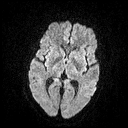
[im 36/54]
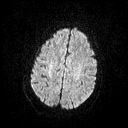
[im 45/54]
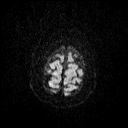
[im 54/54]
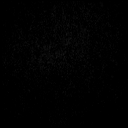

[Series 101: DWI · coronal · 3.0mm · 1.80mm/px · 7 of 51 slices shown (4 of 4)]
[im 1/51]
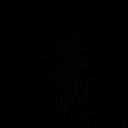
[im 9/51]
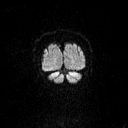
[im 17/51]
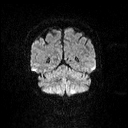
[im 26/51]
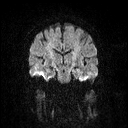
[im 34/51]
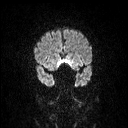
[im 42/51]
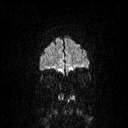
[im 51/51]
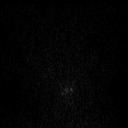

[48 of 48 positions shown; findings below may reference images not displayed]

FINDINGS: No evidence for acute infarction, hemorrhage, mass lesion,
hydrocephalus, or extra-axial fluid.

Mild cerebral and cerebellar atrophy, premature for age. Slight
prominence of perivascular spaces, nonspecific, can be seen in
chronic hypertension.

Remote RIGHT posterior frontal cortical and subcortical infarction.
Paucity of white matter disease elsewhere.

Diminished caliber of the LEFT internal carotid artery compared to
the RIGHT in its petrous, cavernous, and supraclinoid segments,
possibly also in the high cervical region. Extracranial stenosis or
dissection could have this appearance. Symmetric and equal vertebral
flow voids contribute to normal-appearing basilar artery.

Normal midline structures.  No tonsillar herniation.

Negative orbits, sinuses, and mastoids. RIGHT middle turbinate
concha bullosa. No definite osseous findings.
IMPRESSION: No acute intracranial findings.  Specifically no acute stroke.

Premature for age atrophy with a remote RIGHT posterior frontal
cortical and subcortical infarction.

Apparent diminished caliber of the LEFT internal carotid artery
through much of its visualized course. A proximal extracranial
stenosis or dissection is not excluded. Recommend CT angiography of
the head and neck for further evaluation to evaluate the LEFT
carotid system.

## 2016-01-30 ENCOUNTER — Telehealth: Payer: Self-pay

## 2016-01-30 NOTE — Telephone Encounter (Signed)
Dorothy Long, NT           Patient d/c from [facility] 01/24/16. She was admitted for a stoke, GI Bleed and blood clot in left leg. She has other appt, and could not schedule a appt with this office.

## 2016-02-01 ENCOUNTER — Ambulatory Visit: Payer: BC Managed Care – PPO | Admitting: Internal Medicine

## 2016-02-06 ENCOUNTER — Inpatient Hospital Stay
Admission: EM | Admit: 2016-02-06 | Discharge: 2016-02-12 | DRG: 377 | Disposition: A | Discharge status: Home / Self Care | Payer: BC Managed Care – PPO | Attending: Specialist | Admitting: Specialist

## 2016-02-06 DIAGNOSIS — K922 Gastrointestinal hemorrhage, unspecified: Secondary | ICD-10-CM

## 2016-02-06 DIAGNOSIS — Z888 Allergy status to other drugs, medicaments and biological substances status: Secondary | ICD-10-CM | POA: Diagnosis not present

## 2016-02-06 DIAGNOSIS — Z8249 Family history of ischemic heart disease and other diseases of the circulatory system: Secondary | ICD-10-CM

## 2016-02-06 DIAGNOSIS — K2970 Gastritis, unspecified, without bleeding: Secondary | ICD-10-CM | POA: Diagnosis present

## 2016-02-06 DIAGNOSIS — I058 Other rheumatic mitral valve diseases: Secondary | ICD-10-CM | POA: Diagnosis present

## 2016-02-06 DIAGNOSIS — Z66 Do not resuscitate: Secondary | ICD-10-CM | POA: Diagnosis present

## 2016-02-06 DIAGNOSIS — I429 Cardiomyopathy, unspecified: Secondary | ICD-10-CM | POA: Diagnosis present

## 2016-02-06 DIAGNOSIS — R609 Edema, unspecified: Secondary | ICD-10-CM

## 2016-02-06 DIAGNOSIS — I050 Rheumatic mitral stenosis: Secondary | ICD-10-CM | POA: Diagnosis present

## 2016-02-06 DIAGNOSIS — I69354 Hemiplegia and hemiparesis following cerebral infarction affecting left non-dominant side: Secondary | ICD-10-CM

## 2016-02-06 DIAGNOSIS — I2781 Cor pulmonale (chronic): Secondary | ICD-10-CM | POA: Diagnosis present

## 2016-02-06 DIAGNOSIS — I5042 Chronic combined systolic (congestive) and diastolic (congestive) heart failure: Secondary | ICD-10-CM | POA: Diagnosis present

## 2016-02-06 DIAGNOSIS — F17210 Nicotine dependence, cigarettes, uncomplicated: Secondary | ICD-10-CM | POA: Diagnosis present

## 2016-02-06 DIAGNOSIS — Z90710 Acquired absence of both cervix and uterus: Secondary | ICD-10-CM

## 2016-02-06 DIAGNOSIS — Z992 Dependence on renal dialysis: Secondary | ICD-10-CM

## 2016-02-06 DIAGNOSIS — I272 Other secondary pulmonary hypertension: Secondary | ICD-10-CM | POA: Diagnosis present

## 2016-02-06 DIAGNOSIS — N250 Renal osteodystrophy: Secondary | ICD-10-CM | POA: Diagnosis present

## 2016-02-06 DIAGNOSIS — K274 Chronic or unspecified peptic ulcer, site unspecified, with hemorrhage: Principal | ICD-10-CM | POA: Diagnosis present

## 2016-02-06 DIAGNOSIS — K3189 Other diseases of stomach and duodenum: Secondary | ICD-10-CM | POA: Diagnosis present

## 2016-02-06 DIAGNOSIS — I132 Hypertensive heart and chronic kidney disease with heart failure and with stage 5 chronic kidney disease, or end stage renal disease: Secondary | ICD-10-CM | POA: Diagnosis present

## 2016-02-06 DIAGNOSIS — Z9049 Acquired absence of other specified parts of digestive tract: Secondary | ICD-10-CM | POA: Diagnosis not present

## 2016-02-06 DIAGNOSIS — K5730 Diverticulosis of large intestine without perforation or abscess without bleeding: Secondary | ICD-10-CM | POA: Diagnosis present

## 2016-02-06 DIAGNOSIS — Z809 Family history of malignant neoplasm, unspecified: Secondary | ICD-10-CM

## 2016-02-06 DIAGNOSIS — I739 Peripheral vascular disease, unspecified: Secondary | ICD-10-CM | POA: Diagnosis present

## 2016-02-06 DIAGNOSIS — K626 Ulcer of anus and rectum: Secondary | ICD-10-CM | POA: Diagnosis present

## 2016-02-06 DIAGNOSIS — D62 Acute posthemorrhagic anemia: Secondary | ICD-10-CM | POA: Diagnosis present

## 2016-02-06 DIAGNOSIS — Z8711 Personal history of peptic ulcer disease: Secondary | ICD-10-CM | POA: Diagnosis not present

## 2016-02-06 DIAGNOSIS — I708 Atherosclerosis of other arteries: Secondary | ICD-10-CM | POA: Diagnosis present

## 2016-02-06 DIAGNOSIS — I774 Celiac artery compression syndrome: Secondary | ICD-10-CM | POA: Diagnosis present

## 2016-02-06 DIAGNOSIS — J449 Chronic obstructive pulmonary disease, unspecified: Secondary | ICD-10-CM | POA: Diagnosis present

## 2016-02-06 DIAGNOSIS — D631 Anemia in chronic kidney disease: Secondary | ICD-10-CM | POA: Diagnosis present

## 2016-02-06 DIAGNOSIS — K7460 Unspecified cirrhosis of liver: Secondary | ICD-10-CM | POA: Diagnosis present

## 2016-02-06 DIAGNOSIS — R1013 Epigastric pain: Secondary | ICD-10-CM

## 2016-02-06 DIAGNOSIS — N2581 Secondary hyperparathyroidism of renal origin: Secondary | ICD-10-CM | POA: Diagnosis present

## 2016-02-06 DIAGNOSIS — K210 Gastro-esophageal reflux disease with esophagitis: Secondary | ICD-10-CM | POA: Diagnosis present

## 2016-02-06 DIAGNOSIS — Z7982 Long term (current) use of aspirin: Secondary | ICD-10-CM

## 2016-02-06 DIAGNOSIS — I749 Embolism and thrombosis of unspecified artery: Secondary | ICD-10-CM | POA: Diagnosis present

## 2016-02-06 DIAGNOSIS — D649 Anemia, unspecified: Secondary | ICD-10-CM

## 2016-02-06 DIAGNOSIS — E1122 Type 2 diabetes mellitus with diabetic chronic kidney disease: Secondary | ICD-10-CM | POA: Diagnosis present

## 2016-02-06 DIAGNOSIS — Z79899 Other long term (current) drug therapy: Secondary | ICD-10-CM | POA: Diagnosis not present

## 2016-02-06 DIAGNOSIS — I871 Compression of vein: Secondary | ICD-10-CM

## 2016-02-06 DIAGNOSIS — N186 End stage renal disease: Secondary | ICD-10-CM | POA: Diagnosis present

## 2016-02-06 HISTORY — DX: Gastrointestinal hemorrhage, unspecified: K92.2

## 2016-02-06 LAB — COMPREHENSIVE METABOLIC PANEL
ALBUMIN: 2.2 g/dL — AB (ref 3.5–5.0)
ALK PHOS: 91 U/L (ref 38–126)
ALT: 11 U/L — ABNORMAL LOW (ref 14–54)
ANION GAP: 17 — AB (ref 5–15)
AST: 23 U/L (ref 15–41)
BUN: 92 mg/dL — ABNORMAL HIGH (ref 6–20)
CO2: 18 mmol/L — AB (ref 22–32)
Calcium: 8.2 mg/dL — ABNORMAL LOW (ref 8.9–10.3)
Chloride: 98 mmol/L — ABNORMAL LOW (ref 101–111)
Creatinine, Ser: 7.97 mg/dL — ABNORMAL HIGH (ref 0.44–1.00)
GFR calc Af Amer: 6 mL/min — ABNORMAL LOW (ref >60)
GFR calc non Af Amer: 5 mL/min — ABNORMAL LOW (ref >60)
GLUCOSE: 88 mg/dL (ref 65–99)
POTASSIUM: 4.9 mmol/L (ref 3.5–5.1)
SODIUM: 133 mmol/L — AB (ref 135–145)
Total Bilirubin: 0.8 mg/dL (ref 0.3–1.2)
Total Protein: 5.1 g/dL — ABNORMAL LOW (ref 6.5–8.1)

## 2016-02-06 LAB — CBC
HEMATOCRIT: 14.1 % — AB (ref 35.0–47.0)
HEMOGLOBIN: 4.5 g/dL — AB (ref 12.0–16.0)
MCH: 29.3 pg (ref 26.0–34.0)
MCHC: 31.9 g/dL — AB (ref 32.0–36.0)
MCV: 91.9 fL (ref 80.0–100.0)
Platelets: 194 10*3/uL (ref 150–440)
RBC: 1.54 MIL/uL — ABNORMAL LOW (ref 3.80–5.20)
RDW: 20 % — ABNORMAL HIGH (ref 11.5–14.5)
WBC: 8.1 10*3/uL (ref 3.6–11.0)

## 2016-02-06 LAB — ABO/RH: ABO/RH(D): B POS

## 2016-02-06 LAB — PROTIME-INR
INR: 1.37
Prothrombin Time: 17 seconds — ABNORMAL HIGH (ref 11.4–15.0)

## 2016-02-06 LAB — GLUCOSE, CAPILLARY: Glucose-Capillary: 93 mg/dL (ref 65–99)

## 2016-02-06 LAB — APTT: aPTT: 32 seconds (ref 24–36)

## 2016-02-06 LAB — TROPONIN I: TROPONIN I: 0.35 ng/mL — AB (ref <0.031)

## 2016-02-06 MED ORDER — SODIUM CHLORIDE 0.9% FLUSH
3.0000 mL | Freq: Two times a day (BID) | INTRAVENOUS | Status: DC
  Administered 2016-02-06 – 2016-02-11 (×10): 3 mL via INTRAVENOUS

## 2016-02-06 MED ORDER — ONDANSETRON HCL 4 MG/2ML IJ SOLN
INTRAMUSCULAR | Status: AC
Start: 2016-02-06 — End: 2016-02-07
  Administered 2016-02-07: 4 mg via INTRAVENOUS
  Filled 2016-02-06: qty 2

## 2016-02-06 MED ORDER — SODIUM CHLORIDE 0.9 % IV SOLN
80.0000 mg | Freq: Once | INTRAVENOUS | Status: AC
  Administered 2016-02-06: 80 mg via INTRAVENOUS
  Filled 2016-02-06: qty 80

## 2016-02-06 MED ORDER — IPRATROPIUM-ALBUTEROL 0.5-2.5 (3) MG/3ML IN SOLN: 3.0000 mL | Freq: Four times a day (QID) | RESPIRATORY_TRACT | Status: DC | PRN

## 2016-02-06 MED ORDER — SODIUM CHLORIDE 0.9 % IV BOLUS (SEPSIS)
1000.0000 mL | Freq: Once | INTRAVENOUS | Status: AC
  Administered 2016-02-06: 1000 mL via INTRAVENOUS

## 2016-02-06 MED ORDER — SODIUM CHLORIDE 0.9% FLUSH: 3.0000 mL | INTRAVENOUS | Status: DC | PRN

## 2016-02-06 MED ORDER — MORPHINE SULFATE (PF) 2 MG/ML IV SOLN
2.0000 mg | INTRAVENOUS | Status: DC | PRN
  Administered 2016-02-07 – 2016-02-12 (×9): 2 mg via INTRAVENOUS
  Filled 2016-02-06 (×10): qty 1

## 2016-02-06 MED ORDER — SODIUM CHLORIDE 0.9 % IV SOLN
Freq: Once | INTRAVENOUS | Status: AC
  Administered 2016-02-06: 23:00:00 via INTRAVENOUS

## 2016-02-06 MED ORDER — FLEET ENEMA 7-19 GM/118ML RE ENEM: 1.0000 | ENEMA | Freq: Once | RECTAL | Status: DC | PRN

## 2016-02-06 MED ORDER — SODIUM CHLORIDE 0.9 % IV SOLN
10.0000 mL/h | Freq: Once | INTRAVENOUS | Status: AC
  Administered 2016-02-06: 10 mL/h via INTRAVENOUS

## 2016-02-06 MED ORDER — SODIUM CHLORIDE 0.9% FLUSH
3.0000 mL | Freq: Two times a day (BID) | INTRAVENOUS | Status: DC
  Administered 2016-02-07: 3 mL via INTRAVENOUS

## 2016-02-06 MED ORDER — SODIUM CHLORIDE 0.9 % IV SOLN
8.0000 mg/h | INTRAVENOUS | Status: DC
  Administered 2016-02-07 – 2016-02-09 (×5): 8 mg/h via INTRAVENOUS
  Filled 2016-02-06 (×5): qty 80

## 2016-02-06 MED ORDER — ONDANSETRON HCL 4 MG/2ML IJ SOLN
4.0000 mg | Freq: Once | INTRAMUSCULAR | Status: AC
  Administered 2016-02-06: 4 mg via INTRAVENOUS

## 2016-02-06 MED ORDER — INSULIN ASPART 100 UNIT/ML ~~LOC~~ SOLN: 0.0000 [IU] | SUBCUTANEOUS | Status: DC

## 2016-02-06 MED ORDER — ONDANSETRON HCL 4 MG PO TABS: 4.0000 mg | ORAL_TABLET | Freq: Four times a day (QID) | ORAL | Status: DC | PRN

## 2016-02-06 MED ORDER — PANTOPRAZOLE SODIUM 40 MG IV SOLR: 40.0000 mg | Freq: Two times a day (BID) | INTRAVENOUS | Status: DC

## 2016-02-06 MED ORDER — SODIUM CHLORIDE 0.9 % IV SOLN: 250.0000 mL | INTRAVENOUS | Status: DC | PRN

## 2016-02-06 MED ORDER — ONDANSETRON HCL 4 MG/2ML IJ SOLN
4.0000 mg | Freq: Four times a day (QID) | INTRAMUSCULAR | Status: DC | PRN
  Administered 2016-02-07: 4 mg via INTRAVENOUS
  Filled 2016-02-06: qty 2

## 2016-02-06 MED ORDER — BISACODYL 10 MG RE SUPP: 10.0000 mg | Freq: Every day | RECTAL | Status: DC | PRN

## 2016-02-06 NOTE — H&P (Signed)
[facility] [place] at [facility]   PATIENT NAME: Dorothy Long    MR#:  [id number]  DATE OF BIRTH:  1961/02/16  DATE OF ADMISSION:  02/06/2016  PRIMARY CARE PHYSICIAN: Sherlene Shams, MD   REQUESTING/REFERRING PHYSICIAN: Dr. Sharma Covert  CHIEF COMPLAINT:   Chief Complaint  Patient presents with  . Altered Mental Status    HISTORY OF PRESENT ILLNESS:  Dorothy Long  is a 55 y.o. female with a known history of attention, diabetes, end-stage renal disease on peritoneal dialysis, CVA, anemia of chronic disease presents to the emergency room brought in by family after patient has had decreased appetite and weakness of 2 days. Patient has also been found to have melena for 2 days. She has continued taking the medications and compliant with dialysis. Patient had a recent stroke and presently is getting physical therapy through home health. Is mostly bed bound but gets up with help and walker. Has had on and off confusion since her stroke. Today she is unable to contribute to much history but states she has had belly pain. Patient has history of gastric ulcers with EGD and cautery in [facility] as per family. Today her stool is positive for blood. Hemoglobin is 4.5 and is being admitted to the hospitalist service being critically ill.  PAST MEDICAL HISTORY:   Past Medical History  Diagnosis Date  . Hypertension   . Hyperlipidemia   . COPD (chronic obstructive pulmonary disease) (HCC)     a. not on home O2  . Anemia   . Valvular disease     a. 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 echo 2015: EF 45-50%, borderline LVH, mod dilated LA, mildly dilated RA, small pericardial effusion, mod MR (rheumatic deformity), MS mild-mod, no gradient, PASP ele  . Chronic combined systolic and  diastolic CHF (congestive heart failure) (HCC)     a. echo reports above  . PAF (paroxysmal atrial fibrillation) (HCC)     a. in setting of diarrhea 09/21/2013; b. not on long term full dose anticoagulation; c. CHADSVASC at least 4 (CHF, HTN, DM, female)  . Diabetes mellitus (HCC)   . Peritoneal dialysis status (HCC) 4- 15-15  . Ventral hernia   . Inguinal hernia   . History of stress test     a. 2014: no convincing evidence of pharmacologically induced ischemia,  apparent ant apical defect present only on attenuation corrected images favored to reflect artifact due to overcorrection & may be worse on stress imaging secondary to greater patient motion. A small area of ischemia is felt less likely but is difficult to entirely exclude, EF 55%  . ESRD (end stage renal disease) on dialysis (HCC)     a. HD on T,T,S; b. 2/2 focal segmental glomerulosclerosis   . Dialysis patient (HCC)   . Renal insufficiency   . Stroke [facility]) 11/2015    a. MRI L cereblal infarctions in the MCA/PCA, ACA/MCA territory; b. felt to be carotid in etiology; c. medically managed on DAPT per neuro    PAST SURGICAL HISTORY:   Past Surgical History  Procedure Laterality Date  . Abdominal hysterectomy    . Cholecystectomy    . Knee surgery Right   . Foot surgery Bilateral   . Av fistula placement Left 10-26-13    Dr Gilda Crease  .  Peritoneal catheter insertion  03-29-14    Dr Gilda Crease  . Inguinal hernia repair Left 07/13/2015    Procedure: HERNIA REPAIR INGUINAL ADULT;  Surgeon: Earline Mayotte, MD;  Location: [facility] ORS;  Service: General;  Laterality: Left;  Marland Kitchen Ventral hernia repair N/A 07/13/2015    Procedure: HERNIA REPAIR VENTRAL ADULT;  Surgeon: Earline Mayotte, MD;  Location: [facility] ORS;  Service: General;  Laterality: N/A;    SOCIAL HISTORY:   Social History  Substance Use Topics  . Smoking status: Current Some Day Smoker -- 0.50 packs/day for 25 years    Types: Cigarettes  . Smokeless tobacco: Never Used  .  Alcohol Use: No     Comment: occasional    FAMILY HISTORY:   Family History  Problem Relation Age of Onset  . Cancer Mother     colon  . Heart disease Father   . Hypertension Father     DRUG ALLERGIES:   Allergies  Allergen Reactions  . Ciprocinonide [Fluocinolone] Hives  . Demerol [Meperidine] Nausea And Vomiting  . Diflucan [Fluconazole] Nausea Only  . Flagyl [Metronidazole] Hives and Itching  . Hydrocodone Itching  . Levaquin [Levofloxacin In D5w] Other (See Comments)    Numbness in legs  . Morphine And Related Nausea And Vomiting  . Tetracyclines & Related Itching and Swelling  . Valium [Diazepam] Nausea Only    hallucinations    REVIEW OF SYSTEMS:   Review of Systems  Unable to perform ROS: mental acuity    MEDICATIONS AT HOME:   Prior to Admission medications   Medication Sig Start Date End Date Taking? Authorizing Provider  acetaminophen (TYLENOL) 325 MG tablet Take 325 mg by mouth every 4 (four) hours as needed for mild pain.    Yes Historical Provider, MD  albuterol (VENTOLIN HFA) 108 (90 BASE) MCG/ACT inhaler Inhale 1 puff into the lungs every 4 (four) hours as needed for wheezing or shortness of breath.    Yes Historical Provider, MD  aspirin 81 MG chewable tablet Chew 81 mg by mouth daily.   Yes Historical Provider, MD  atorvastatin (LIPITOR) 80 MG tablet Take 1 tablet by mouth daily. 01/24/16  Yes Historical Provider, MD  calcitRIOL (ROCALTROL) 0.5 MCG capsule Take 1 capsule by mouth daily. 01/24/16  Yes Historical Provider, MD  Calcium Acetate 667 MG TABS Take 3 capsules by mouth 3 (three) times daily.   Yes Historical Provider, MD  cinacalcet (SENSIPAR) 30 MG tablet Take 30 mg by mouth daily.   Yes Historical Provider, MD  citalopram (CELEXA) 20 MG tablet Take 1 tablet by mouth daily. 01/24/16  Yes Historical Provider, MD  docusate sodium (COLACE) 100 MG capsule Take 100 mg by mouth daily.   Yes Historical Provider, MD  epoetin alfa (EPOGEN,PROCRIT) 16109  UNIT/ML injection Inject 25,000 Units into the skin once a week.   Yes Historical Provider, MD  ondansetron (ZOFRAN-ODT) 4 MG disintegrating tablet Take 4 mg by mouth every 12 (twelve) hours as needed for nausea or vomiting.   Yes Historical Provider, MD  pantoprazole (PROTONIX) 40 MG tablet Take 1 tablet by mouth 2 (two) times daily. 01/24/16  Yes Historical Provider, MD  sodium chloride 1 g tablet Take 2 g by mouth 2 (two) times daily with a meal.   Yes Historical Provider, MD  tiotropium (SPIRIVA) 18 MCG inhalation capsule Place 1 capsule (18 mcg total) into inhaler and inhale daily. 08/22/15  Yes Carollee Leitz, NP     VITAL SIGNS:  Blood pressure  110/90, pulse 95, temperature 98.3 F (36.8 C), temperature source Oral, resp. rate 24, height  (1.778 m), weight 44.453 kg (98 lb), SpO2 97 %.  PHYSICAL EXAMINATION:  Physical Exam  GENERAL:  55 y.o.-year-old patient lying in the bed with no acute distress.  EYES: Pupils equal, round, reactive to light and accommodation. No scleral icterus. Extraocular muscles intact. Pallor positive  HEENT: Head atraumatic, normocephalic. Oropharynx and nasopharynx clear. No oropharyngeal erythema, moist oral mucosa  NECK:  Supple, no jugular venous distention. No thyroid enlargement, no tenderness.  LUNGS: Normal breath sounds bilaterally, no wheezing, rales, rhonchi. No use of accessory muscles of respiration.  CARDIOVASCULAR: S1, S2 normal. No murmurs, rubs, or gallops.  ABDOMEN: Soft, nondistended. Bowel sounds present. No organomegaly or mass. Epigastric tenderness. EXTREMITIES: No pedal edema, cyanosis, or clubbing. + 2 pedal & radial pulses b/l.   NEUROLOGIC: Motor strength decreased on the right side. Normal on the left. PSYCHIATRIC: The patient is alert and awake.  LABORATORY PANEL:   CBC  Recent Labs Lab 02/06/16 1856  WBC 8.1  HGB 4.5*  HCT 14.1*  PLT 194    ------------------------------------------------------------------------------------------------------------------  Chemistries   Recent Labs Lab 02/06/16 1856  NA 133*  K 4.9  CL 98*  CO2 18*  GLUCOSE 88  BUN 92*  CREATININE 7.97*  CALCIUM 8.2*  AST 23  ALT 11*  ALKPHOS 91  BILITOT 0.8   ------------------------------------------------------------------------------------------------------------------  Cardiac Enzymes  Recent Labs Lab 02/06/16 1856  TROPONINI 0.35*   ------------------------------------------------------------------------------------------------------------------  RADIOLOGY:  No results found.   IMPRESSION AND PLAN:   * Acute upper GI bleed with melena and anemia and acute blood loss anemia History of gastric ulcers. This is the likely etiology again Nothing by mouth Transfuse 2 units packed RBC Protonix drip Consult GI for EGD Patient is critically ill. Admit to ICU.  * End-stage renal disease on peritoneal dialysis Consult nephrology for dialysis needs  * Chronic systolic CHF with ejection fraction of 45-50% Monitor for fluid overload  * Diabetes mellitus Every 4 Accu-Cheks while nothing by mouth. Sliding scale insulin  * Hypertension Hold medications as the patient's blood pressure is in the low-normal range due to GI bleed.  * History of CVA. Hold aspirin due to GI bleed  * DVT prophylaxis with SCDs. No heparin or Lovenox.  All the records are reviewed and case discussed with ED provider. Management plans discussed with the patient, family and they are in agreement.  CODE STATUS: DNR Discussed with husband at bedside who is her healthcare power of attorney.  TOTAL CC TIME TAKING CARE OF THIS PATIENT: 40 minutes.   Milagros Loll R M.D on 02/06/2016 at 10:21 PM  Between 7am to 6pm - Pager - [phone]  After 6pm go to [email or web address] - password [email or web address]  [facility]  Office   [phone]  CC: Primary care physician; Sherlene Shams, MD  Note: This dictation was prepared with Dragon dictation along with smaller phrase technology. Any transcriptional errors that result from this process are unintentional.

## 2016-02-06 NOTE — ED Provider Notes (Addendum)
[facility] Emergency Department Provider Note  ____________________________________________  Time seen: Approximately 8:23 PM  I have reviewed the triage vital signs and the nursing notes.   HISTORY  Chief Complaint Altered Mental Status    HPI Dorothy Long is a 55 y.o. female with a history ofCVA resulting in right-sided weakness 12/16, ESRD on peritoneal dialysis, paroxysmal A. fib on Plavix presenting with abdominal pain, and generalized weakness. For the past 3 days, the patient has had significant decreased energy with associated shortness of breath which is worse with exertion. She has also had black tarry-colored looking stools. She has diffuse abdominal pain from the rib cage down to the pelvis. She has not had any fever, or diarrhea. She has had nausea without vomiting.  In December, when the patient was treated for her stroke at [facility], she was diagnosed with a bleeding peptic ulcer by endoscopy. The family reports that it was intervened on (?banding).   Past Medical History  Diagnosis Date  . Hypertension   . Hyperlipidemia   . COPD (chronic obstructive pulmonary disease) (HCC)     a. not on home O2  . Anemia   . Valvular disease     a. 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 echo 2015: EF 45-50%, borderline LVH, mod dilated LA, mildly dilated RA, small pericardial effusion, mod MR (rheumatic deformity), MS mild-mod, no gradient, PASP ele  . Chronic combined systolic and diastolic CHF (congestive heart failure) (HCC)     a. echo reports above  . PAF (paroxysmal atrial fibrillation) (HCC)     a. in setting of diarrhea 09/21/2013; b. not on long term full dose anticoagulation; c. CHADSVASC at least 4 (CHF, HTN, DM, female)  . Diabetes mellitus (HCC)   . Peritoneal  dialysis status (HCC) 4- 15-15  . Ventral hernia   . Inguinal hernia   . History of stress test     a. 2014: no convincing evidence of pharmacologically induced ischemia,  apparent ant apical defect present only on attenuation corrected images favored to reflect artifact due to overcorrection & may be worse on stress imaging secondary to greater patient motion. A small area of ischemia is felt less likely but is difficult to entirely exclude, EF 55%  . ESRD (end stage renal disease) on dialysis (HCC)     a. HD on T,T,S; b. 2/2 focal segmental glomerulosclerosis   . Dialysis patient (HCC)   . Renal insufficiency   . Stroke [facility]) 11/2015    a. MRI L cereblal infarctions in the MCA/PCA, ACA/MCA territory; b. felt to be carotid in etiology; c. medically managed on DAPT per neuro    Patient Active Problem List   Diagnosis Date Noted  . Aphasia   . Confusion   . Elevated troponin   . Mitral valve stenosis   . Congestive dilated cardiomyopathy (HCC)   . Carotid stenosis   . Dysarthria 12/11/2015  . ESRD (end stage renal disease) on dialysis (HCC) 12/11/2015  . TIA (transient ischemic attack) 12/11/2015  . Acute CVA (cerebrovascular accident) (HCC)   . NSTEMI (non-ST elevated myocardial infarction) (HCC)   . Ethmoid sinusitis 11/22/2015  . Syncope 11/20/2015  . Carotid artery narrowing   . Cerebral infarction due to unspecified mechanism   . CVA (cerebral infarction) 11/17/2015  . Nosebleed 11/13/2015  .  Chronic combined systolic and diastolic CHF (congestive heart failure) (HCC)   . Hospital discharge follow-up 06/28/2015  . Diarrhea 06/22/2015  . Fatty liver 06/22/2015  . Nausea & vomiting 06/22/2015  . SBP (spontaneous bacterial peritonitis) (HCC) 06/22/2015  . Chest pain 06/15/2015  . TMJ (sprain of temporomandibular joint) 05/21/2015  . Postmenopausal atrophic vaginitis 06/19/2014  . Allergic rhinitis 05/29/2014  . Bilateral leg edema 05/15/2014  . Awaiting organ transplant  12/23/2013  . Hyperparathyroidism due to renal insufficiency (HCC) 12/23/2013  . Excess fluid volume 09/27/2013  . Paroxysmal atrial fibrillation (HCC) 09/25/2013  . GERD (gastroesophageal reflux disease) 08/12/2013  . Focal and segmental hyalinosis 08/08/2013  . Mitral stenosis with incompetence or regurgitation 07/19/2013  . Anemia in chronic kidney disease 07/19/2013  . Chronic kidney disease with peritoneal dialysis as preferred modality, stage 5 (HCC) 07/14/2013  . Tobacco abuse counseling 12/20/2012  . Vitamin D deficiency 07/22/2012  . Hyperlipidemia   . Hypertension 07/21/2012  . Family history of colon cancer 07/21/2012  . History of tobacco abuse 07/21/2012    Past Surgical History  Procedure Laterality Date  . Abdominal hysterectomy    . Cholecystectomy    . Knee surgery Right   . Foot surgery Bilateral   . Av fistula placement Left 10-26-13    Dr Gilda Crease  . Peritoneal catheter insertion  03-29-14    Dr Gilda Crease  . Inguinal hernia repair Left 07/13/2015    Procedure: HERNIA REPAIR INGUINAL ADULT;  Surgeon: Earline Mayotte, MD;  Location: [facility] ORS;  Service: General;  Laterality: Left;  Marland Kitchen Ventral hernia repair N/A 07/13/2015    Procedure: HERNIA REPAIR VENTRAL ADULT;  Surgeon: Earline Mayotte, MD;  Location: [facility] ORS;  Service: General;  Laterality: N/A;    Current Outpatient Rx  Name  Route  Sig  Dispense  Refill  . acetaminophen (TYLENOL) 325 MG tablet   Oral   Take 325 mg by mouth every 4 (four) hours as needed for mild pain.          Marland Kitchen albuterol (VENTOLIN HFA) 108 (90 BASE) MCG/ACT inhaler   Inhalation   Inhale 1-2 puffs into the lungs every 6 (six) hours as needed for wheezing or shortness of breath.          . ALPRAZolam (XANAX) 0.25 MG tablet   Oral   Take 1 tablet (0.25 mg total) by mouth 3 (three) times daily as needed for sleep. Patient not taking: Reported on 12/10/2015   30 tablet   0   . amoxicillin-clavulanate (AUGMENTIN) 875-125 MG  tablet   Oral   Take 1 tablet by mouth 2 (two) times daily.   14 tablet   0   . aspirin EC 81 MG tablet   Oral   Take 1 tablet (81 mg total) by mouth daily. Patient not taking: Reported on 12/10/2015   30 tablet   0   . atorvastatin (LIPITOR) 20 MG tablet   Oral   Take 1 tablet (20 mg total) by mouth daily.   90 tablet   3   . Calcium Acetate 667 MG TABS   Oral   Take 3 capsules by mouth 3 (three) times daily.         . clopidogrel (PLAVIX) 75 MG tablet   Oral   Take 1 tablet (75 mg total) by mouth daily.   30 tablet   0   . feeding supplement, ENSURE ENLIVE, (ENSURE ENLIVE) LIQD   Oral   Take 237 mLs  by mouth 2 (two) times daily between meals.   237 mL   12   . furosemide (LASIX) 40 MG tablet   Oral   Take 40-80 mg by mouth daily.          . metoprolol succinate (TOPROL-XL) 25 MG 24 hr tablet   Oral   Take 25 mg by mouth daily.         . ondansetron (ZOFRAN ODT) 4 MG disintegrating tablet   Oral   Take 1 tablet (4 mg total) by mouth every 8 (eight) hours as needed for nausea or vomiting. Patient not taking: Reported on 12/10/2015   20 tablet   0   . potassium chloride (K-DUR,KLOR-CON) 10 MEQ tablet   Oral   Take 2 tablets (20 mEq total) by mouth daily.   30 tablet   0   . RENVELA 800 MG tablet   Oral   Take 1,600 mg by mouth 3 (three) times daily with meals. And one tablet with snacks           Dispense as written.   . tiotropium (SPIRIVA) 18 MCG inhalation capsule   Inhalation   Place 1 capsule (18 mcg total) into inhaler and inhale daily.   30 capsule   0     Allergies Ciprocinonide; Demerol; Diflucan; Flagyl; Hydrocodone; Levaquin; Morphine and related; Tetracyclines & related; and Valium  Family History  Problem Relation Age of Onset  . Cancer Mother     colon  . Heart disease Father   . Hypertension Father     Social History Social History  Substance Use Topics  . Smoking status: Current Some Day Smoker -- 0.50  packs/day for 25 years    Types: Cigarettes  . Smokeless tobacco: Never Used  . Alcohol Use: No     Comment: occasional    Review of Systems Constitutional: No fever/chills. Positive lightheadedness. Negative syncope. Eyes: No visual changes. ENT: No sore throat. No rhinorrhea or congestion. Cardiovascular: Denies chest pain, palpitations. Respiratory: Positive exertional shortness of breath.  No cough. Gastrointestinal: Positive diffuse abdominal pain.  Positive nausea, no vomiting.  No diarrhea.  No constipation. Positive melena Genitourinary: Negative for dysuria. Musculoskeletal: Negative for back pain. Skin: Negative for rash. Neurological: Negative for headache. Positive right upper extremity and right lower extremity weakness that is chronic and unchanged.  10-point ROS otherwise negative.  ____________________________________________   PHYSICAL EXAM:  VITAL SIGNS: ED Triage Vitals  Enc Vitals Group     BP 02/06/16 1853 113/75 mmHg     Pulse Rate 02/06/16 1853 103     Resp 02/06/16 1853 22     Temp 02/06/16 1853 98.3 F (36.8 C)     Temp Source 02/06/16 1853 Oral     SpO2 02/06/16 1853 100 %     Weight 02/06/16 1853 98 lb (44.453 kg)     Height 02/06/16 1853 5\' 10"  (1.778 m)     Head Cir --      Peak Flow --      Pain Score 02/06/16 1854 10     Pain Loc --      Pain Edu? --      Excl. in GC? --     Constitutional: Patient is chronically ill-appearing. She is alert and able to answer questions appropriately.  Eyes: Conjunctivae are normal.  EOMI. positive conjunctival pallor. Head: Atraumatic. Nose: No congestion/rhinnorhea. Mouth/Throat: Mucous membranes are dry.  Neck: No stridor.  Supple.  JVD. Cardiovascular: Fast rate, regular rhythm.  No murmurs, rubs or gallops.  Respiratory: Normal respiratory effort.  No retractions. Lungs CTAB.  No wheezes, rales or ronchi. Gastrointestinal: Abdomen is soft and nondistended. She has a peritoneal catheter in the  left lower quadrant that does not have any surrounding swelling, erythema or discharge, nor any tenderness to palpation. The patient has diffuse mild tenderness to palpation except in the epigastrium where she has moderate tenderness to palpation. No peritoneal signs. No rebound or guarding. Negative Murphy sign. GU: No evidence of external or palpable internal hemorrhoids. Positive black stool that is guaiac positive. No pain with rectal exam. Musculoskeletal: No LE edema.  Neurologic:  Patient is alert and answering questions appropriately. Her speech is clear. Face and smile are symmetric. EOMI and PERRLA. Decreased motor tone in the left upper and left lower extremities. Moves the left side appropriately and normally.  Skin:  Skin is warm, dry and intact. No rash noted. Psychiatric: Mood and affect are normal. Speech and behavior are normal.  Normal judgement.  ____________________________________________   LABS (all labs ordered are listed, but only abnormal results are displayed)  Labs Reviewed  COMPREHENSIVE METABOLIC PANEL - Abnormal; Notable for the following:    Sodium 133 (*)    Chloride 98 (*)    CO2 18 (*)    BUN 92 (*)    Creatinine, Ser 7.97 (*)    Calcium 8.2 (*)    Total Protein 5.1 (*)    Albumin 2.2 (*)    ALT 11 (*)    GFR calc non Af Amer 5 (*)    GFR calc Af Amer 6 (*)    Anion gap 17 (*)    All other components within normal limits  CBC - Abnormal; Notable for the following:    RBC 1.54 (*)    Hemoglobin 4.5 (*)    HCT 14.1 (*)    MCHC 31.9 (*)    RDW 20.0 (*)    All other components within normal limits  APTT  PROTIME-INR  TROPONIN I  PREPARE RBC (CROSSMATCH)  TYPE AND SCREEN   ____________________________________________  EKG  ED ECG REPORT I, Rockne Menghini, the attending physician, personally viewed and interpreted this ECG.   Date: 02/06/2016  EKG Time: 1906  Rate: 100  Rhythm: sinus tachycardia  Axis: Normal  Intervals:Prolonged  QTC.  ST&T Change: No ST elevation. No ischemic changes.  ____________________________________________  RADIOLOGY  No results found.  ____________________________________________   PROCEDURES  Procedure(s) performed: None  Critical Care performed: Yes ____________________________________________   INITIAL IMPRESSION / ASSESSMENT AND PLAN / ED COURSE  Pertinent labs & imaging results that were available during my care of the patient were reviewed by me and considered in my medical decision making (see chart for details).  55 y.o. female with a history of bleeding peptic ulcer, CVA, ESRD on peritoneal dialysis presenting with abdominal pain and generalized fatigue, exertional shortness of breath. The patient has a mild tachycardia and laboratory studies from triage which show a hemoglobin of 4.5. She also has melena with rectal exam is guaiac positive. I'm concerned that the patient has a repeat bleeding peptic ulcer, and I will order blood transfusion for her. We will also give her IV fluids, and Protonix. She will require admission to the hospital.  CRITICAL CARE Performed by: Rockne Menghini   Total critical care time: 35 minutes  Critical care time was exclusive of separately billable procedures and treating other patients.  Critical care was necessary to treat or prevent imminent or  life-threatening deterioration.  Critical care was time spent personally by me on the following activities: development of treatment plan with patient and/or surrogate as well as nursing, discussions with consultants, evaluation of patient's response to treatment, examination of patient, obtaining history from patient or surrogate, ordering and performing treatments and interventions, ordering and review of laboratory studies, ordering and review of radiographic studies, pulse oximetry and re-evaluation of patient's condition.   ____________________________________________  FINAL  CLINICAL IMPRESSION(S) / ED DIAGNOSES  Final diagnoses:  Gastrointestinal hemorrhage associated with peptic ulcer  Symptomatic anemia  Epigastric abdominal pain      NEW MEDICATIONS STARTED DURING THIS VISIT:  New Prescriptions   No medications on file     Rockne Menghini, MD 02/06/16 2112  Rockne Menghini, MD 02/06/16 2131

## 2016-02-06 NOTE — ED Notes (Signed)
Pt arrives to ER via POV c/o AMS X 1 days. Pt states "something isn't right". Fatigued, weak. Pt does peritoneal at home each evening. Unsure of year, aware of person place and situation.

## 2016-02-06 NOTE — ED Notes (Signed)
Called pharmacy to ask status of protonix. Pharmacy was told they would send up

## 2016-02-06 NOTE — ED Notes (Signed)
Called floor to let them know pt on the way up 

## 2016-02-07 ENCOUNTER — Inpatient Hospital Stay: Payer: BC Managed Care – PPO

## 2016-02-07 LAB — CBC
HCT: 21.4 % — ABNORMAL LOW (ref 35.0–47.0)
HEMOGLOBIN: 7 g/dL — AB (ref 12.0–16.0)
MCH: 29 pg (ref 26.0–34.0)
MCHC: 32.7 g/dL (ref 32.0–36.0)
MCV: 88.6 fL (ref 80.0–100.0)
Platelets: 163 10*3/uL (ref 150–440)
RBC: 2.41 MIL/uL — ABNORMAL LOW (ref 3.80–5.20)
RDW: 17.1 % — ABNORMAL HIGH (ref 11.5–14.5)
WBC: 9.4 10*3/uL (ref 3.6–11.0)

## 2016-02-07 LAB — BASIC METABOLIC PANEL
Anion gap: 16 — ABNORMAL HIGH (ref 5–15)
BUN: 95 mg/dL — ABNORMAL HIGH (ref 6–20)
CALCIUM: 8 mg/dL — AB (ref 8.9–10.3)
CO2: 18 mmol/L — ABNORMAL LOW (ref 22–32)
Chloride: 100 mmol/L — ABNORMAL LOW (ref 101–111)
Creatinine, Ser: 8.09 mg/dL — ABNORMAL HIGH (ref 0.44–1.00)
GFR calc Af Amer: 6 mL/min — ABNORMAL LOW (ref >60)
GFR, EST NON AFRICAN AMERICAN: 5 mL/min — AB (ref >60)
GLUCOSE: 103 mg/dL — AB (ref 65–99)
POTASSIUM: 5.1 mmol/L (ref 3.5–5.1)
Sodium: 134 mmol/L — ABNORMAL LOW (ref 135–145)

## 2016-02-07 LAB — GLUCOSE, CAPILLARY
GLUCOSE-CAPILLARY: 106 mg/dL — AB (ref 65–99)
Glucose-Capillary: 98 mg/dL (ref 65–99)
Glucose-Capillary: 97 mg/dL (ref 65–99)
Glucose-Capillary: 92 mg/dL (ref 65–99)
Glucose-Capillary: 103 mg/dL — ABNORMAL HIGH (ref 65–99)

## 2016-02-07 LAB — HEMOGLOBIN
HEMOGLOBIN: 7.1 g/dL — AB (ref 12.0–16.0)
HEMOGLOBIN: 7.3 g/dL — AB (ref 12.0–16.0)

## 2016-02-07 LAB — MRSA PCR SCREENING: MRSA BY PCR: NEGATIVE

## 2016-02-07 LAB — PREPARE RBC (CROSSMATCH)

## 2016-02-07 MED ORDER — SODIUM CHLORIDE 0.9 % IV SOLN: Freq: Once | INTRAVENOUS | Status: DC

## 2016-02-07 MED ORDER — DELFLEX-LC/1.5% DEXTROSE 346 MOSM/L IP SOLN
INTRAPERITONEAL | Status: DC
  Administered 2016-02-07 – 2016-02-09 (×2): via INTRAPERITONEAL
  Administered 2016-02-10: 6000 mL via INTRAPERITONEAL
  Administered 2016-02-11: 20:00:00 via INTRAPERITONEAL
  Filled 2016-02-07: qty 3000

## 2016-02-07 MED ORDER — GENTAMICIN SULFATE 0.1 % EX OINT
TOPICAL_OINTMENT | Freq: Every day | CUTANEOUS | Status: DC
  Administered 2016-02-07 – 2016-02-11 (×5): via TOPICAL
  Filled 2016-02-07: qty 15

## 2016-02-07 MED ORDER — GENTAMICIN SULFATE 0.1 % EX CREA
1.0000 "application " | TOPICAL_CREAM | Freq: Every day | CUTANEOUS | Status: DC
  Filled 2016-02-07: qty 15

## 2016-02-07 MED ORDER — ATORVASTATIN CALCIUM 10 MG PO TABS
80.0000 mg | ORAL_TABLET | Freq: Every day | ORAL | Status: DC
  Administered 2016-02-07 – 2016-02-11 (×5): 80 mg via ORAL
  Filled 2016-02-07 (×3): qty 4
  Filled 2016-02-07 (×2): qty 8
  Filled 2016-02-07: qty 4

## 2016-02-07 MED ORDER — CEPASTAT 14.5 MG MT LOZG
1.0000 | LOZENGE | OROMUCOSAL | Status: DC | PRN
  Filled 2016-02-07: qty 9

## 2016-02-07 MED ORDER — HEPARIN 1000 UNIT/ML FOR PERITONEAL DIALYSIS
500.0000 [IU] | INTRAMUSCULAR | Status: DC | PRN
  Filled 2016-02-07: qty 0.5

## 2016-02-07 NOTE — Consult Note (Signed)
GI Inpatient Consult Note  Reason for Consult: GI Bleed    Attending Requesting Consult: Dr. Winona Legato  History of Present Illness: Dorothy Long is a 55 y.o. female seen for evaluation of GI bleed at the request of Dr. Winona Legato. PMhx of HTN, HLD, COPD, ESRD (on PD), CVA (11/2014), atrial fib admitted for a history of AMS.   She was admitted to [facility] in 12/2015 from [facility] where she was staying for post stroke rehab. She developed decreased responsiveness and hypotension w/ profuse rectal bleeding. EGD x2 and colonoscopy as below. She was anticoagulated w/ Plavix prior to admission. She also had DVT and had IVC filter placed given inability to anticoagulate. Also intubated due to respiratory failure during admission. Hgb was 7.7 at time of discharge. Aspirin restarted at discharge. Reccommended tagged pRBC scan if re-bleed. Discharged from Rehab on 01/24/16.   Most of history is obtained from husband-patient is fair historian. He reports patient developing lethargy about 3-4 days ago, only getting out of bed to eat which seemed increased above her baseline. He also began noticing melena about 2 days ago, having 2-3 black stools per day. No maroon or BRBRP. She was eating normally at home per husband. Denies any chronic GI symptoms of GERD, dysphagia, odynophagia, N/V. Husband believes she is taking her medications prescribed at discharge but unsure about protonix specifically. She states she has abd pain but limited description. Does not feel it specifically relates to PO intake or defecation.   On admission her Hgb was found to be 4.5 (appears baseline is 9-10 per chart review prior to acute illnesses).    Last Colonoscopy & EGD:    Colonoscopy [facility] 12/29/2015 - Impression: - The examined portion of the ileum was normal. - Diverticulosis in the sigmoid colon and in the  descending colon. - Friability with contact bleeding in the entire examined  colon. - A single (solitary) ulcer in the  rectum. Clip was placed. - The examination was otherwise normal on direct and  retroflexion views. - No specimens collected. - bleeding source likely either diverticular or ulcer  EGD [facility] 12/28/15 Impression: - LA Grade A esophagitis. Improved from prior EGD. - Gastritis. - Duodenitis. - No specimens collected.  EGD Impression: 12/26/15 - LA Grade D esophagitis. - A few non-bleeding erosions in the middle third of the  esophagus and in the lower third of the esophagus. - Congested, erythematous, friable (with contact bleeding)  and inflamed mucosa in the stomach. - Normal examined duodenum.     Past Medical History:  Past Medical History  Diagnosis Date  . Hypertension   . Hyperlipidemia   . COPD (chronic obstructive pulmonary disease) (HCC)     a. not on home O2  . Anemia   . Valvular disease     a. 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 echo 2015: EF 45-50%, borderline LVH, mod dilated LA, mildly dilated RA, small pericardial effusion, mod MR (rheumatic deformity), MS mild-mod, no gradient, PASP ele  . Chronic combined systolic and diastolic CHF (congestive heart failure) (HCC)     a. echo reports above  . PAF (paroxysmal atrial fibrillation) (HCC)     a. in setting of diarrhea 09/21/2013; b. not on long term full dose anticoagulation; c. CHADSVASC at least 4 (CHF, HTN, DM, female)  . Diabetes  mellitus (HCC)   . Peritoneal dialysis status (HCC) 4- 15-15  . Ventral hernia   . Inguinal hernia   . History of stress test     a. 2014: no convincing evidence of pharmacologically induced ischemia,  apparent ant apical defect present only on attenuation corrected images favored to reflect artifact due to overcorrection & may be worse on stress imaging secondary to greater patient motion. A small area of  ischemia is felt less likely but is difficult to entirely exclude, EF 55%  . ESRD (end stage renal disease) on dialysis (HCC)     a. HD on T,T,S; b. 2/2 focal segmental glomerulosclerosis   . Dialysis patient (HCC)   . Renal insufficiency   . Stroke [facility]) 11/2015    a. MRI L cereblal infarctions in the MCA/PCA, ACA/MCA territory; b. felt to be carotid in etiology; c. medically managed on DAPT per neuro    Problem List: Patient Active Problem List   Diagnosis Date Noted  . Upper GI bleed 02/06/2016  . Aphasia   . Confusion   . Elevated troponin   . Mitral valve stenosis   . Congestive dilated cardiomyopathy (HCC)   . Carotid stenosis   . Dysarthria 12/11/2015  . ESRD (end stage renal disease) on dialysis (HCC) 12/11/2015  . TIA (transient ischemic attack) 12/11/2015  . Acute CVA (cerebrovascular accident) (HCC)   . NSTEMI (non-ST elevated myocardial infarction) (HCC)   . Ethmoid sinusitis 11/22/2015  . Syncope 11/20/2015  . Carotid artery narrowing   . Cerebral infarction due to unspecified mechanism   . CVA (cerebral infarction) 11/17/2015  . Nosebleed 11/13/2015  . Chronic combined systolic and diastolic CHF (congestive heart failure) (HCC)   . Hospital discharge follow-up 06/28/2015  . Diarrhea 06/22/2015  . Fatty liver 06/22/2015  . Nausea & vomiting 06/22/2015  . SBP (spontaneous bacterial peritonitis) (HCC) 06/22/2015  . Chest pain 06/15/2015  . TMJ (sprain of temporomandibular joint) 05/21/2015  . Postmenopausal atrophic vaginitis 06/19/2014  . Allergic rhinitis 05/29/2014  . Bilateral leg edema 05/15/2014  . Awaiting organ transplant 12/23/2013  . Hyperparathyroidism due to renal insufficiency (HCC) 12/23/2013  . Excess fluid volume 09/27/2013  . Paroxysmal atrial fibrillation (HCC) 09/25/2013  . GERD (gastroesophageal reflux disease) 08/12/2013  . Focal and segmental hyalinosis 08/08/2013  . Mitral stenosis with incompetence or regurgitation 07/19/2013  . Anemia  in chronic kidney disease 07/19/2013  . Chronic kidney disease with peritoneal dialysis as preferred modality, stage 5 (HCC) 07/14/2013  . Tobacco abuse counseling 12/20/2012  . Vitamin D deficiency 07/22/2012  . Hyperlipidemia   . Hypertension 07/21/2012  . Family history of colon cancer 07/21/2012  . History of tobacco abuse 07/21/2012    Past Surgical History: Past Surgical History  Procedure Laterality Date  . Abdominal hysterectomy    . Cholecystectomy    . Knee surgery Right   . Foot surgery Bilateral   . Av fistula placement Left 10-26-13    Dr Gilda Crease  . Peritoneal catheter insertion  03-29-14    Dr Gilda Crease  . Inguinal hernia repair Left 07/13/2015    Procedure: HERNIA REPAIR INGUINAL ADULT;  Surgeon: Earline Mayotte, MD;  Location: [facility] ORS;  Service: General;  Laterality: Left;  Marland Kitchen Ventral hernia repair N/A 07/13/2015    Procedure: HERNIA REPAIR VENTRAL ADULT;  Surgeon: Earline Mayotte, MD;  Location: [facility] ORS;  Service: General;  Laterality: N/A;    Allergies: Allergies  Allergen Reactions  . Ciprocinonide [Fluocinolone] Hives  . Demerol [Meperidine]  Nausea And Vomiting  . Diflucan [Fluconazole] Nausea Only  . Flagyl [Metronidazole] Hives and Itching  . Hydrocodone Itching  . Levaquin [Levofloxacin In D5w] Other (See Comments)    Numbness in legs  . Morphine And Related Nausea And Vomiting  . Tetracyclines & Related Itching and Swelling  . Valium [Diazepam] Nausea Only    hallucinations    Home Medications: Prescriptions prior to admission  Medication Sig Dispense Refill Last Dose  . acetaminophen (TYLENOL) 325 MG tablet Take 325 mg by mouth every 4 (four) hours as needed for mild pain.    prn at prn  . albuterol (VENTOLIN HFA) 108 (90 BASE) MCG/ACT inhaler Inhale 1 puff into the lungs every 4 (four) hours as needed for wheezing or shortness of breath.    prn at prn  . aspirin 81 MG chewable tablet Chew 81 mg by mouth daily.   unknown at unknown  .  atorvastatin (LIPITOR) 80 MG tablet Take 1 tablet by mouth daily.   unknown at unknown  . calcitRIOL (ROCALTROL) 0.5 MCG capsule Take 1 capsule by mouth daily.   unknown at unknown  . Calcium Acetate 667 MG TABS Take 3 capsules by mouth 3 (three) times daily.   unknown at unknown  . cinacalcet (SENSIPAR) 30 MG tablet Take 30 mg by mouth daily.   unknown at unknown  . citalopram (CELEXA) 20 MG tablet Take 1 tablet by mouth daily.   unknown at unknown  . docusate sodium (COLACE) 100 MG capsule Take 100 mg by mouth daily.   unknown at unknown  . epoetin alfa (EPOGEN,PROCRIT) 16109 UNIT/ML injection Inject 25,000 Units into the skin once a week.   unknown at unknown  . ondansetron (ZOFRAN-ODT) 4 MG disintegrating tablet Take 4 mg by mouth every 12 (twelve) hours as needed for nausea or vomiting.   prn at prn  . pantoprazole (PROTONIX) 40 MG tablet Take 1 tablet by mouth 2 (two) times daily.   unknown at unknown  . sodium chloride 1 g tablet Take 2 g by mouth 2 (two) times daily with a meal.   unknown at unknown  . tiotropium (SPIRIVA) 18 MCG inhalation capsule Place 1 capsule (18 mcg total) into inhaler and inhale daily. 30 capsule 0 unknown at unknown   Home medication reconciliation was completed with the patient.   Scheduled Inpatient Medications:   . sodium chloride   Intravenous Once  . dialysis solution 1.5% low-MG/low-CA   Intraperitoneal Q24H  . gentamicin ointment   Topical Daily  . insulin aspart  0-9 Units Subcutaneous 6 times per day  . sodium chloride flush  3 mL Intravenous Q12H    Continuous Inpatient Infusions:   . pantoprozole (PROTONIX) infusion 8 mg/hr (02/07/16 0836)    PRN Inpatient Medications:  bisacodyl, heparin, ipratropium-albuterol, morphine injection, ondansetron **OR** ondansetron (ZOFRAN) IV, phenol-menthol, sodium phosphate  Family History: family history includes Cancer in her mother; Heart disease in her father; Hypertension in her father.  The patient's  family history is negative for inflammatory bowel disorders, GI malignancy, or solid organ transplantation.  Social History:   reports that she has been smoking Cigarettes.  She has a 12.5 pack-year smoking history. She has never used smokeless tobacco. She reports that she does not drink alcohol or use illicit drugs.    Review of Systems: Constitutional: Down #4lbs per patient.  Eyes: No changes in vision. ENT: No oral lesions, sore throat.  GI: see HPI.  Heme/Lymph: No easy bruising.  CV: No chest  pain.  GU: No hematuria.  Integumentary: No rashes.  Neuro: No headaches.  Psych: No depression/anxiety.  Endocrine: No heat/cold intolerance.  Allergic/Immunologic: No urticaria.  Resp: No cough, SOB.  Musculoskeletal: No joint swelling.    Physical Examination: BP 106/73 mmHg  Pulse 88  Temp(Src) 97.6 F (36.4 C) (Oral)  Resp 20  Ht 5\' 10"  (1.778 m)  Wt 120 lb 2.4 oz (54.5 kg)  BMI 17.24 kg/m2  SpO2 100% Gen: NAD, alert and oriented x 4. Flat affect. Appears older than stated age.  HEENT: PEERLA, EOMI, Neck: supple, no JVD or thyromegaly Chest: CTA bilaterally, no wheezes, crackles, or other adventitious sounds CV: RRR, no m/g/c/r Abd: soft, non distended, + BS, minimally TTP in epigastric area.  Rectal - black stool w/ tinge of dark maroon in rectal vault. Heme +.  Ext: left foot with 3+ edema, no erythema or tenderness.  Skin: no rash or lesions noted Neuro-residual weakness LLE.   Data: Lab Results  Component Value Date   WBC 9.4 02/07/2016   HGB 7.0* 02/07/2016   HCT 21.4* 02/07/2016   MCV 88.6 02/07/2016   PLT 163 02/07/2016    Recent Labs Lab 02/06/16 1856 02/07/16 0717  HGB 4.5* 7.0*   Lab Results  Component Value Date   NA 134* 02/07/2016   K 5.1 02/07/2016   CL 100* 02/07/2016   CO2 18* 02/07/2016   BUN 95* 02/07/2016   CREATININE 8.09* 02/07/2016   GLU 138 11/15/2014   Lab Results  Component Value Date   ALT 11* 02/06/2016   AST 23  02/06/2016   ALKPHOS 91 02/06/2016   BILITOT 0.8 02/06/2016    Recent Labs Lab 02/06/16 2022  APTT 32  INR 1.37   Assessment/Plan: Ms. Gravois is a 55 y.o. female admitted for altered mental status found to have severe anemia.    1. Anemia - Hgb 4.5 on admission. Recently hospitalized at [facility] for massive GI bleed, unclear definite etiology-see above. Stool in rectal vault more consistent w/ stomach, small bowel, or right colon bleed. Does not seem related to rectal ulcer on last colonoscopy 6 weeks ago. Normocytic. Elevated INR at 1.37. Has improved to 7.0 on repeat after 2 units. Has an additional unit pending. Heme + on exam. Needs repeat EGD-plan for tomorrow. If EGD is negative consider colonoscopy on Friday for further evaluation.   Recommendations: 1. Sips and chips  2. NPO at Midnight 3. EGD tomorrow 4. Monitor H/H, transfuse PRN.  5. Hold aspirin.  6. Continue PPI gtt.    Case discussed w/ Dr. Mechele Collin. Thank you for the consult. Please call with questions or concerns.  Bluford Kaufmann, PA-C [facility] GI

## 2016-02-07 NOTE — Progress Notes (Signed)
Subjective:  The patient admitted to the hospital for low hemoglobin Patient received blood transfusion This morning hemoglobin is up to 7.0 She reports swelling in her right foot She was recently admitted to [facility] for hyperkalemia hypotension, rectal bleeding. Prior to that, she was in the rehabilitation for a stroke. EGD done and [facility] shows grade D esophagitis prominent in the lower and middle 3rd of the esophagus with friable exudates and erosions. Colonoscopy on January 14 showed rectal ulcers  Objective:  Vital signs in last 24 hours:  Temp:  [97.3 F (36.3 C)-98.7 F (37.1 C)] 97.6 F (36.4 C) (02/23 1111) Pulse Rate:  [88-103] 88 (02/23 1111) Resp:  [18-29] 20 (02/23 1111) BP: (99-120)/(68-90) 106/73 mmHg (02/23 1111) SpO2:  [97 %-100 %] 100 % (02/23 1111) Weight:  [44.453 kg (98 lb)-54.5 kg (120 lb 2.4 oz)] 54.5 kg (120 lb 2.4 oz) (02/23 0500)  Weight change:  Filed Weights   02/06/16 1853 02/06/16 2312 02/07/16 0500  Weight: 44.453 kg (98 lb) 54.5 kg (120 lb 2.4 oz) 54.5 kg (120 lb 2.4 oz)    Intake/Output:    Intake/Output Summary (Last 24 hours) at 02/07/16 1118 Last data filed at 02/07/16 0700  Gross per 24 hour  Intake 1394.17 ml  Output      0 ml  Net 1394.17 ml     Physical Exam: General: Thin, frail  HEENT Pale conjunctiva, moist oral mucous membranes  Neck supple  Pulm/lungs Lungs are clear to auscultation bilaterally  CVS/Heart No rub or gallop  Abdomen:  Soft, nontender,  Extremities: Right leg edema, left leg no edema  Neurologic: Right hemiparesis, right arm weakness  Skin: No acute rashes  Access: PD catheter, site nontender       Basic Metabolic Panel:   Recent Labs Lab 02/06/16 1856 02/07/16 0717  NA 133* 134*  K 4.9 5.1  CL 98* 100*  CO2 18* 18*  GLUCOSE 88 103*  BUN 92* 95*  CREATININE 7.97* 8.09*  CALCIUM 8.2* 8.0*     CBC:  Recent Labs Lab 02/06/16 1856 02/07/16 0717  WBC 8.1 9.4  HGB 4.5* 7.0*  HCT 14.1* 21.4*   MCV 91.9 88.6  PLT 194 163      Microbiology:  Recent Results (from the past 720 hour(s))  MRSA PCR Screening     Status: None   Collection Time: 02/06/16 11:22 PM  Result Value Ref Range Status   MRSA by PCR NEGATIVE NEGATIVE Final    Comment:        The GeneXpert MRSA Assay (FDA approved for NASAL specimens only), is one component of a comprehensive MRSA colonization surveillance program. It is not intended to diagnose MRSA infection nor to guide or monitor treatment for MRSA infections.     Coagulation Studies:  Recent Labs  02/06/16 2022  LABPROT 17.0*  INR 1.37    Urinalysis: No results for input(s): COLORURINE, LABSPEC, PHURINE, GLUCOSEU, HGBUR, BILIRUBINUR, KETONESUR, PROTEINUR, UROBILINOGEN, NITRITE, LEUKOCYTESUR in the last 72 hours.  Invalid input(s): APPERANCEUR    Imaging: No results found.   Medications:   . pantoprozole (PROTONIX) infusion 8 mg/hr (02/07/16 0836)   . insulin aspart  0-9 Units Subcutaneous 6 times per day  . sodium chloride flush  3 mL Intravenous Q12H   bisacodyl, ipratropium-albuterol, morphine injection, ondansetron **OR** ondansetron (ZOFRAN) IV, sodium phosphate  Assessment/ Plan:  55 y.o. female with history of end-stage renal disease (FSGS) currently on PD, paroxysmal atrial fibrillation, COPD, cirrhosis, stroke with left-sided weakness 2017,severe  mitral stenosis,depression, thrombocytopenia, and negative anticardiolipin antibody, weakly positive ANA, acute GI bleed in January 2017 caused by severe esophagitis and bleeding rectal ulcer, IVC filter  1.  End-stage renal disease - Will continue peritoneal dialysis she is in the hospital  2. Anemia of GI bleed and chronic Kidney Disease - patient received blood transfusion - Will avoid Procrit for now because of recent DVT and stroke  3.  Secondary hyperparathyroidism -  Monitor Phos while in the hospital   ,   LOS: 1 Dorothy Long 2/23/201711:18 AM

## 2016-02-07 NOTE — Care Management Note (Signed)
Patient is an active PD patient at [facility] [address]  Clinic is aware of admission and I will update them with additional records at discharge.  [name]  Dialysis Coordinator  [phone]

## 2016-02-07 NOTE — Progress Notes (Signed)
One unit of blood commenced at 22:50. Patient tolerated first 15 minutes well. VS WNL - no complaints voiced.

## 2016-02-07 NOTE — Progress Notes (Signed)
[facility] [place] at [facility]   PATIENT NAME: Dorothy Long    MR#:  [id number]  DATE OF BIRTH:  1961/04/06  SUBJECTIVE:  CHIEF COMPLAINT:   Chief Complaint  Patient presents with  . Altered Mental Status   patient is 55 year old Afro-American female with past medical history significant for the. History of stroke who is on aspirin therapy at home , and residual disease on peritoneal dialysis, diabetes, recent treatment for gastrointestinal bleed at [facility] who presents to the hospital with complaints of altered mental status, weakness. In emergency room, hip, patient's hemoglobin level was found to be low at 4.5 and she was admitted, she was transfused packed red blood cells and her hemoglobin level improved to 7.0. Patient continues to have melanotic stool. She was noted to have right lower extremity swelling. Patient is status post IVC filter placement due to left common femoral vein DVT diagnosed 01/02/2016. Patient denies any significant discomfort. However, on palpation. She admits of upper abdominal pain  Review of Systems  Gastrointestinal: Positive for abdominal pain.    VITAL SIGNS: Blood pressure 106/73, pulse 88, temperature 97.6 F (36.4 C), temperature source Oral, resp. rate 20, height 5\' 10"  (1.778 m), weight 54.5 kg (120 lb 2.4 oz), SpO2 100 %.  PHYSICAL EXAMINATION:   GENERAL:  55 y.o.-year-old patient lying in the bed with no acute distress. Sleepy and has some expressive aphasia EYES: Pupils equal, round, reactive to light and accommodation. No scleral icterus. Extraocular muscles intact.  HEENT: Head atraumatic, normocephalic. Oropharynx and nasopharynx clear.  NECK:  Supple, no jugular venous distention. No thyroid enlargement, no tenderness.  LUNGS: Normal breath sounds bilaterally, no wheezing, rales,rhonchi or crepitation. No use of accessory muscles of respiration.  CARDIOVASCULAR: S1, S2 normal. No murmurs, rubs, or  gallops.  ABDOMEN: Soft, epigastric tenderness on palpation but no rebound , but voluntary guarding was noted, nondistended. Bowel sounds present. No organomegaly or mass.  EXTREMITIES: No pedal edema, cyanosis, or clubbing.  NEUROLOGIC: Cranial nerves II through XII are grossly intact. Muscle strength, unable to evaluate due to significant somnolence. Sensation not able to evaluate. Gait not checked.  PSYCHIATRIC: The patient is somnolent and difficult to assess her orientation  SKIN: No obvious rash, lesion, or ulcer.   ORDERS/RESULTS REVIEWED:   CBC  Recent Labs Lab 02/06/16 1856 02/07/16 0717 02/07/16 1315  WBC 8.1 9.4  --   HGB 4.5* 7.0* 7.1*  HCT 14.1* 21.4*  --   PLT 194 163  --   MCV 91.9 88.6  --   MCH 29.3 29.0  --   MCHC 31.9* 32.7  --   RDW 20.0* 17.1*  --    ------------------------------------------------------------------------------------------------------------------  Chemistries   Recent Labs Lab 02/06/16 1856 02/07/16 0717  NA 133* 134*  K 4.9 5.1  CL 98* 100*  CO2 18* 18*  GLUCOSE 88 103*  BUN 92* 95*  CREATININE 7.97* 8.09*  CALCIUM 8.2* 8.0*  AST 23  --   ALT 11*  --   ALKPHOS 91  --   BILITOT 0.8  --    ------------------------------------------------------------------------------------------------------------------ estimated creatinine clearance is 7 mL/min (by C-G formula based on Cr of 8.09). ------------------------------------------------------------------------------------------------------------------ No results for input(s): TSH, T4TOTAL, T3FREE, THYROIDAB in the last 72 hours.  Invalid input(s): FREET3  Cardiac Enzymes  Recent Labs Lab 02/06/16 1856  TROPONINI 0.35*   ------------------------------------------------------------------------------------------------------------------ Invalid input(s):  POCBNP ---------------------------------------------------------------------------------------------------------------  RADIOLOGY: US Venous Img Lower Unilateral Right  02/07/2016  CLINICAL DATA:  Swelling. EXAM: RIGHT LOWER EXTREMITY VENOUS DOPPLER ULTRASOUND TECHNIQUE: Gray-scale sonography with graded compression, as well as color Doppler and duplex ultrasound were performed to evaluate the lower extremity deep venous systems from the level of the common femoral vein and including the common femoral, femoral, profunda femoral, popliteal and calf veins including the posterior tibial, peroneal and gastrocnemius veins when visible. The superficial great saphenous vein was also interrogated. Spectral Doppler was utilized to evaluate flow at rest and with distal augmentation maneuvers in the common femoral, femoral and popliteal veins. COMPARISON:  No prior. FINDINGS: Contralateral Common Femoral Vein: Respiratory phasicity is normal and symmetric with the symptomatic side. No evidence of thrombus. Normal compressibility. Common Femoral Vein: No evidence of thrombus. Normal compressibility, respiratory phasicity and response to augmentation. Saphenofemoral Junction: No evidence of thrombus. Normal compressibility and flow on color Doppler imaging. Profunda Femoral Vein: No evidence of thrombus. Normal compressibility and flow on color Doppler imaging. Femoral Vein: No evidence of thrombus. Normal compressibility, respiratory phasicity and response to augmentation. Popliteal Vein: No evidence of thrombus. Normal compressibility, respiratory phasicity and response to augmentation. Calf Veins: No evidence of thrombus. Normal compressibility and flow on color Doppler imaging. Superficial Great Saphenous Vein: No evidence of thrombus. Normal compressibility and flow on color Doppler imaging. Venous Reflux:  None. Other Findings:  None. IMPRESSION: No evidence of deep venous thrombosis. Electronically Signed   By:  Maisie Fus  Register   On: 02/07/2016 12:50    EKG:  Orders placed or performed during the hospital encounter of 02/06/16  . ED EKG  . ED EKG    ASSESSMENT AND PLAN:  Active Problems:   Upper GI bleed  #1. Acute posthemorrhagic anemia, status post packed red blood cell transfusion, following patient's hemoglobin level closely and transfusing patient as needed, adding 1 more packed red blood cell unit, following hemoglobin level in the morning. #2. Gastrointestinal bleed, likely due to peptic ulcer disease, continue Protonix intravenously, gastroesophageal consultation is obtained, pending. Continue patient nothing by mouth #3. End-stage renal disease, continue peritoneal dialysis per nephrologist, consult is requested. #4. Hyponatremia, follow with therapy #5. Recent stroke, patient's aspirin is on hold. Continue Lipitor. #6. Diabetes mellitus, continue sliding scale insulin as patient is nothing by mouth   Management plans discussed with the patient, family and they are in agreement.   DRUG ALLERGIES:  Allergies  Allergen Reactions  . Ciprocinonide [Fluocinolone] Hives  . Demerol [Meperidine] Nausea And Vomiting  . Diflucan [Fluconazole] Nausea Only  . Flagyl [Metronidazole] Hives and Itching  . Hydrocodone Itching  . Levaquin [Levofloxacin In D5w] Other (See Comments)    Numbness in legs  . Morphine And Related Nausea And Vomiting  . Tetracyclines & Related Itching and Swelling  . Valium [Diazepam] Nausea Only    hallucinations    CODE STATUS:     Code Status Orders        Start     Ordered   02/06/16 2218  Do not attempt resuscitation (DNR)   Continuous    Question Answer Comment  In the event of cardiac or respiratory ARREST Do not call a "code blue"   In the event of cardiac or respiratory ARREST Do not perform Intubation, CPR, defibrillation or ACLS   In the event of cardiac or respiratory ARREST Use medication by any route, position, wound care, and other  measures to relive pain and suffering. May use oxygen, suction and manual treatment of airway obstruction as needed for comfort.      02/06/16  2218    Code Status History    Date Active Date Inactive Code Status Order ID Comments User Context   12/11/2015  3:28 AM 12/19/2015  4:53 PM Full Code [id number]  Ihor Austin, MD Inpatient   11/17/2015  4:50 PM 11/20/2015  3:18 PM Full Code [id number]  Houston Siren, MD Inpatient   10/11/2015  9:32 PM 10/12/2015  5:10 PM Full Code [id number]  Oralia Manis, MD Inpatient   06/23/2015 12:43 AM 06/24/2015  2:21 PM Full Code [id number]  Oralia Manis, MD Inpatient   06/15/2015  2:57 AM 06/16/2015  4:01 PM Full Code [id number]  Wyatt Haste, MD ED      TOTAL TIME TAKING CARE OF THIS PATIENT: 50 minutes.  Discussed with patient's family for about 15 minutes  Halei Hanover M.D on 02/07/2016 at 3:26 PM  Between 7am to 6pm - Pager - [phone]  After 6pm go to [email or web address] - password [email or web address] [facility]  [place] [facility]  Office  [phone]  CC: Primary care physician; Sherlene Shams, MD

## 2016-02-07 NOTE — Consult Note (Signed)
Patient with hx of dialysis, signif heart murmur, GI bleeding of colon and UGI tract with friable mucosa in both areas, her neck veins are very prominent while lying at about 30 degrees.  Looking for something causing venous hypertension as a possible cause for all this. I spoke with Dr. Gilda Crease and will get CT angiogram of chest, abdomen and pelvis tomorrow.  Also need cardiology for echo.  Diarrhea of green stool per nurse so will check stool for C. Diff.

## 2016-02-07 NOTE — Progress Notes (Signed)
Telephone report called to Laureles, Charity fundraiser.  Patient to be transferred to room 125.  Asher Muir, orderly called and notified of transfer.

## 2016-02-07 NOTE — Progress Notes (Signed)
Dr. Winona Legato notified that right leg more swollen than left leg.  Information acknowledged.

## 2016-02-07 NOTE — Progress Notes (Signed)
Patient transported via bed to room 125 by Asher Muir, orderly.

## 2016-02-07 NOTE — Progress Notes (Signed)
Pre-peritoneal dialysis 

## 2016-02-08 ENCOUNTER — Inpatient Hospital Stay: Payer: BC Managed Care – PPO

## 2016-02-08 ENCOUNTER — Encounter: Payer: Self-pay | Admitting: Anesthesiology

## 2016-02-08 ENCOUNTER — Inpatient Hospital Stay: Admit: 2016-02-08 | Payer: BC Managed Care – PPO

## 2016-02-08 ENCOUNTER — Encounter
Admission: EM | Disposition: A | Discharge status: Home / Self Care | Payer: Self-pay | Source: Home / Self Care | Attending: Specialist

## 2016-02-08 LAB — TYPE AND SCREEN
ABO/RH(D): B POS
ANTIBODY SCREEN: NEGATIVE
UNIT DIVISION: 0
UNIT DIVISION: 0
UNIT DIVISION: 0
Unit division: 0
Unit division: 0

## 2016-02-08 LAB — GLUCOSE, CAPILLARY
GLUCOSE-CAPILLARY: 97 mg/dL (ref 65–99)
GLUCOSE-CAPILLARY: 101 mg/dL — AB (ref 65–99)
GLUCOSE-CAPILLARY: 100 mg/dL — AB (ref 65–99)
GLUCOSE-CAPILLARY: 104 mg/dL — AB (ref 65–99)
GLUCOSE-CAPILLARY: 121 mg/dL — AB (ref 65–99)
Glucose-Capillary: 116 mg/dL — ABNORMAL HIGH (ref 65–99)
Glucose-Capillary: 97 mg/dL (ref 65–99)

## 2016-02-08 LAB — HEMOGLOBIN: Hemoglobin: 8.1 g/dL — ABNORMAL LOW (ref 12.0–16.0)

## 2016-02-08 LAB — HEMOGLOBIN AND HEMATOCRIT, BLOOD
HEMATOCRIT: 25.1 % — AB (ref 35.0–47.0)
Hemoglobin: 8.6 g/dL — ABNORMAL LOW (ref 12.0–16.0)

## 2016-02-08 LAB — AMMONIA: AMMONIA: 22 umol/L (ref 9–35)

## 2016-02-08 LAB — PREPARE RBC (CROSSMATCH)

## 2016-02-08 SURGERY — EGD (ESOPHAGOGASTRODUODENOSCOPY)
Anesthesia: General

## 2016-02-08 MED ORDER — SUCRALFATE 1 GM/10ML PO SUSP
1.0000 g | Freq: Four times a day (QID) | ORAL | Status: DC
  Administered 2016-02-08 – 2016-02-12 (×15): 1 g via ORAL
  Filled 2016-02-08 (×15): qty 10

## 2016-02-08 MED ORDER — IOHEXOL 350 MG/ML SOLN
100.0000 mL | Freq: Once | INTRAVENOUS | Status: AC | PRN
  Administered 2016-02-08: 100 mL via INTRAVENOUS

## 2016-02-08 NOTE — Consult Note (Signed)
[facility] Vascular Consult Note  MRN : [id number]  Dorothy Long is a 55 y.o. (August 12, 1961) female who presents with chief complaint of  Chief Complaint  Patient presents with  . Altered Mental Status   History of Present Illness: Consulted by Dr. 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  Patient seen and examined with husband at bedside. Husband provides most of the patients history as the patient is a poor historian. Husband aggressive throughout meeting stating "no one is helping my wife" and "everyone is saying they can't do anything."   Dorothy Long is a 55 year old female with a PMhx of HTN, HLD, COPD, ESRD (on PD), CVA (11/2014), atrial fib admitted for a history of AMS.   Husband reports patient developing lethargy about 3-4 days ago, only getting out of bed to eat which seemed increased above her baseline. She was eating normally at home per husband, no nausea or vomiting. Denies any chronic GI symptoms of GERD, dysphagia, odynophagia, N/V. Patient denies any abdominal pain.   During inpatient workup a CTA of the abdomen and pelvis was performed. 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 The distal branch vessels remain patent due in part to collateralization through the pancreaticoduodenal arcade.   Current Facility-Administered Medications  Medication Dose Route Frequency Provider Last Rate Last Dose  . 0.9 %  sodium chloride infusion   Intravenous Once Katharina Caper, MD      . atorvastatin (LIPITOR) tablet 80 mg  80 mg Oral q1800 Katharina Caper, MD   80 mg at 02/07/16 2121  . bisacodyl (DULCOLAX) suppository 10 mg  10 mg Rectal Daily PRN Milagros Loll, MD      . dialysis solution 1.5% low-MG/low-CA dianeal solution   Intraperitoneal Q24H Harmeet Singh, MD      . gentamicin ointment (GARAMYCIN) 0.1  %   Topical Daily Harmeet Singh, MD      . heparin 1000 unit/ml injection 500 Units  500 Units Intraperitoneal PRN Harmeet Singh, MD      . insulin aspart (novoLOG) injection 0-9 Units  0-9 Units Subcutaneous 6 times per day Milagros Loll, MD   0 Units at 02/07/16 0010  . ipratropium-albuterol (DUONEB) 0.5-2.5 (3) MG/3ML nebulizer solution 3 mL  3 mL Nebulization Q6H PRN Srikar Sudini, MD      . morphine 2 MG/ML injection 2 mg  2 mg Intravenous Q4H PRN Milagros Loll, MD   2 mg at 02/07/16 2122  . ondansetron (ZOFRAN) tablet 4 mg  4 mg Oral Q6H PRN Milagros Loll, MD       Or  . ondansetron (ZOFRAN) injection 4 mg  4 mg Intravenous Q6H PRN Milagros Loll, MD   4 mg at 02/07/16 0738  . pantoprazole (PROTONIX) 80 mg in sodium chloride 0.9 % 250 mL (0.32 mg/mL) infusion  8 mg/hr Intravenous Continuous Rockne Menghini, MD 25 mL/hr at 02/07/16 2121 8 mg/hr at 02/07/16 2121  . phenol-menthol (CEPASTAT) lozenge 1 lozenge  1 lozenge Buccal PRN Katharina Caper, MD      . sodium chloride flush (NS) 0.9 % injection 3 mL  3 mL Intravenous Q12H Srikar Sudini, MD   3 mL at 02/07/16 1000  . sodium phosphate (FLEET) 7-19 GM/118ML enema 1 enema  1 enema Rectal Once PRN Srikar Sudini, MD      . sucralfate (CARAFATE) 1 GM/10ML  suspension 1 g  1 g Oral 4 times per day Scot Jun, MD        Past Medical History  Diagnosis Date  . Hypertension   . Hyperlipidemia   . COPD (chronic obstructive pulmonary disease) (HCC)     a. not on home O2  . Anemia   . Valvular disease     a. 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 echo 2015: EF 45-50%, borderline LVH, mod dilated LA, mildly dilated RA, small pericardial effusion, mod MR (rheumatic deformity), MS mild-mod, no gradient, PASP ele  . Chronic combined systolic and diastolic CHF  (congestive heart failure) (HCC)     a. echo reports above  . PAF (paroxysmal atrial fibrillation) (HCC)     a. in setting of diarrhea 09/21/2013; b. not on long term full dose anticoagulation; c. CHADSVASC at least 4 (CHF, HTN, DM, female)  . Diabetes mellitus (HCC)   . Peritoneal dialysis status (HCC) 4- 15-15  . Ventral hernia   . Inguinal hernia   . History of stress test     a. 2014: no convincing evidence of pharmacologically induced ischemia,  apparent ant apical defect present only on attenuation corrected images favored to reflect artifact due to overcorrection & may be worse on stress imaging secondary to greater patient motion. A small area of ischemia is felt less likely but is difficult to entirely exclude, EF 55%  . ESRD (end stage renal disease) on dialysis (HCC)     a. HD on T,T,S; b. 2/2 focal segmental glomerulosclerosis   . Dialysis patient (HCC)   . Renal insufficiency   . Stroke [facility]) 11/2015    a. MRI L cereblal infarctions in the MCA/PCA, ACA/MCA territory; b. felt to be carotid in etiology; c. medically managed on DAPT per neuro    Past Surgical History  Procedure Laterality Date  . Abdominal hysterectomy    . Cholecystectomy    . Knee surgery Right   . Foot surgery Bilateral   . Av fistula placement Left 10-26-13    Dr Gilda Crease  . Peritoneal catheter insertion  03-29-14    Dr Gilda Crease  . Inguinal hernia repair Left 07/13/2015    Procedure: HERNIA REPAIR INGUINAL ADULT;  Surgeon: Earline Mayotte, MD;  Location: [facility] ORS;  Service: General;  Laterality: Left;  Marland Kitchen Ventral hernia repair N/A 07/13/2015    Procedure: HERNIA REPAIR VENTRAL ADULT;  Surgeon: Earline Mayotte, MD;  Location: [facility] ORS;  Service: General;  Laterality: N/A;    Social History Social History  Substance Use Topics  . Smoking status: Current Some Day Smoker -- 0.50 packs/day for 25 years    Types: Cigarettes  . Smokeless tobacco: Never Used  . Alcohol Use: No     Comment: occasional     Family History Family History  Problem Relation Age of Onset  . Cancer Mother     colon  . Heart disease Father   . Hypertension Father   Denies any family history of porphyria, PAD or renal disease.   Allergies  Allergen Reactions  . Ciprocinonide [Fluocinolone] Hives  . Demerol [Meperidine] Nausea And Vomiting  . Diflucan [Fluconazole] Nausea Only  . Flagyl [Metronidazole] Hives and Itching  . Hydrocodone Itching  . Levaquin [Levofloxacin In D5w] Other (See Comments)    Numbness in  legs  . Morphine And Related Nausea And Vomiting  . Tetracyclines & Related Itching and Swelling  . Valium [Diazepam] Nausea Only    hallucinations     REVIEW OF SYSTEMS (Negative unless checked)  Constitutional: [] Weight loss  [] Fever  [] Chills Cardiac: [] Chest pain   [] Chest pressure   [] Palpitations   [] Shortness of breath when laying flat   [] Shortness of breath at rest   [] Shortness of breath with exertion. Vascular:  [] Pain in legs with walking   [] Pain in legs at rest   [] Pain in legs when laying flat   [] Claudication   [] Pain in feet when walking  [] Pain in feet at rest  [] Pain in feet when laying flat   [] History of DVT   [] Phlebitis   [] Swelling in legs   [] Varicose veins   [] Non-healing ulcers Pulmonary:   [] Uses home oxygen   [] Productive cough   [] Hemoptysis   [] Wheeze  [] COPD   [] Asthma Neurologic:  [] Dizziness  [] Blackouts   [] Seizures   [x] History of stroke   [] History of TIA  [] Aphasia   [] Temporary blindness   [] Dysphagia   [x] Weakness or numbness in arms   [x] Weakness or numbness in legs Musculoskeletal:  [] Arthritis   [] Joint swelling   [] Joint pain   [] Low back pain Hematologic:  [] Easy bruising  [] Easy bleeding   [] Hypercoagulable state   [] Anemic  [] Hepatitis Gastrointestinal:  [x] Blood in stool   [] Vomiting blood  [] Gastroesophageal reflux/heartburn   [] Difficulty swallowing. Genitourinary:  [x] Chronic kidney disease   [] Difficult urination  [] Frequent urination  [] Burning  with urination   [] Blood in urine Skin:  [] Rashes   [] Ulcers   [] Wounds Psychological:  [] History of anxiety   []  History of major depression.  Physical Examination  Filed Vitals:   02/08/16 0412 02/08/16 0440 02/08/16 0943 02/08/16 1419  BP: 113/82  106/73 106/81  Pulse: 92  86 88  Temp: 97.4 F (36.3 C)  98.1 F (36.7 C) 97.8 F (36.6 C)  TempSrc: Oral  Oral Oral  Resp: 16  24 20   Height:      Weight:  57.063 kg (125 lb 12.8 oz)    SpO2: 100%  100% 99%   Body mass index is 18.05 kg/(m^2).  Gen:  WD/WN, NAD Head: [place]/AT, No temporalis wasting. Prominent temp pulse not noted. Ear/Nose/Throat: Hearing grossly intact, nares w/o erythema or drainage, oropharynx w/o Erythema/Exudate Eyes: PERRLA, EOMI.  Neck: Supple, no nuchal rigidity.  No bruit or JVD.  Pulmonary:  Good air movement, clear to auscultation bilaterally.  Cardiac: RRR, normal S1, S2, no Murmurs, rubs or gallops. Vascular:  Vessel Right Left  Radial Palpable Palpable  Ulnar Palpable Palpable  Brachial Palpable Palpable  Carotid Palpable, without bruit Palpable, without bruit  Aorta Not palpable N/A  Femoral Palpable Palpable  Popliteal Palpable Palpable  PT Palpable Palpable  DP Palpable Palpable   Gastrointestinal: soft, non-tender/non-distended. No guarding/reflex. No masses Musculoskeletal: M/S 5/5 throughout.  Extremities without ischemic changes.  No deformity or atrophy. No edema. Neurologic: CN 2-12 intact. Pain and light touch intact in extremities.  Symmetrical.  Speech is fluent. Motor exam as listed above. Dermatologic: No rashes or ulcers noted.  No cellulitis or open wounds. Lymph : No Cervical, Axillary, or Inguinal lymphadenopathy.  CBC Lab Results  Component Value Date   WBC 9.4 02/07/2016   HGB 8.1* 02/08/2016   HCT 25.1* 02/08/2016   MCV 88.6 02/07/2016   PLT 163 02/07/2016    BMET    Component Value Date/Time  NA 134* 02/07/2016 0717   NA 135* 11/15/2014 1439   NA 135*  11/15/2014 0239   K 5.1 02/07/2016 0717   K 3.7 11/15/2014 1439   CL 100* 02/07/2016 0717   CL 102 11/15/2014 1439   CO2 18* 02/07/2016 0717   CO2 16* 11/15/2014 1439   GLUCOSE 103* 02/07/2016 0717   GLUCOSE 138* 11/15/2014 1439   BUN 95* 02/07/2016 0717   BUN 75* 11/15/2014 1439   BUN 75* 11/15/2014 0239   CREATININE 8.09* 02/07/2016 0717   CREATININE 8.87* 11/15/2014 1439   CREATININE 8.9* 11/15/2014 0239   CALCIUM 8.0* 02/07/2016 0717   CALCIUM 6.1* 11/15/2014 1439   GFRNONAA 5* 02/07/2016 0717   GFRNONAA 5* 11/15/2014 1439   GFRNONAA 8* 07/09/2014 0159   GFRAA 6* 02/07/2016 0717   GFRAA 6* 11/15/2014 1439   GFRAA 9* 07/09/2014 0159   Estimated Creatinine Clearance: 7.3 mL/min (by C-G formula based on Cr of 8.09).  COAG Lab Results  Component Value Date   INR 1.37 02/06/2016   INR 1.05 12/10/2015   INR 1.13 11/17/2015    Radiology Ct Angio Chest Pe W/cm &/or Wo Cm  02/08/2016  CLINICAL DATA:  55 year old female with a history of possible systemic venous obstruction EXAM: CT ANGIOGRAPHY AND VENOGRAPHY CHEST, ABDOMEN AND PELVIS TECHNIQUE: Multidetector CT imaging through the chest, abdomen and pelvis was performed using the standard protocol during bolus administration of intravenous contrast. Multiplanar reconstructed images and MIPs were obtained and reviewed to evaluate the vascular anatomy. CONTRAST:  100 mL Omnipaque 350 COMPARISON:  Prior CT of abdomen and pelvis 06/22/2015; prior CT scan of the chest 02/09/2015 FINDINGS: CTA CHEST FINDINGS Mediastinum: Unremarkable CT appearance of the thyroid gland. No suspicious mediastinal or hilar adenopathy. No soft tissue mediastinal mass. The thoracic esophagus is unremarkable. Heart/Vascular: Pulmonary arterial phase imaging demonstrates excellent opacification of the pulmonary arteries to the proximal subsegmental level. There is no evidence of pulmonary embolus. The main pulmonary artery is enlarged measuring up to 3.6 cm in  diameter. This is larger than the adjacent aorta. The heart is also markedly enlarged, particularly the right heart consistent with cor pulmonale. Case ease calcification is present along the mitral valve annulus. Atherosclerotic calcifications present along the left anterior descending, circumflex and right coronary arteries. No pericardial effusion. Lungs/Pleura: Large layering right pleural effusion with associated right lower lobe atelectasis. No definite pleural enhancement or nodularity. Perhaps mild interlobular septal thickening and ground-glass attenuation airspace opacity in the lungs consistent with mild interstitial edema. Diffuse mild bronchial wall thickening. On the axial images, there is a somewhat nodular opacity in the medial aspect of the left lower lobe measuring 10 x 8 mm. However, on the coronal and sagittal reformatted images this opacity appears more linear and is therefore strongly favored to represent a region of atelectasis. Bones/Soft Tissues: No acute fracture or aggressive appearing lytic or blastic osseous lesion. Review of the MIP images confirms the above findings. CTA ABDOMEN AND PELVIS FINDINGS VASCULAR Aorta: Normal caliber abdominal aorta without evidence of aneurysm. Scattered atherosclerotic plaque is present throughout. No significant stenosis or evidence of dissection. Celiac: High-grade stenosis versus occlusion of the proximal aspect of the celiac artery secondary to bulky and calcified atherosclerotic plaque. The distal branches reconstitute, possibly through collateralization via the pancreaticoduodenal arcade. SMA: Superior mesenteric artery is widely patent. Prominent pancreaticoduodenal arcade. Renals: The native kidneys are atretic bilaterally. The renal arteries are miniscule and not well seen. IMA: Atretic superior mesenteric artery.  No definite stenosis.  Inflow: Calcified atherosclerotic plaque results in focal high-grade stenosis versus short segment occlusion in  the left common iliac artery. There is a focal irregular calcified plaque extending into the proximal right common iliac artery resulting in an approximately 50% stenosis. High-grade stenosis of the origin of the left internal iliac artery. The right internal iliac artery is patent. The external iliac arteries are relatively disease free. Proximal Outflow: Diseased bilateral superficial femoral arteries. At least moderate stenosis in the proximal left SFA and mild stenosis in the proximal right SFA. The vessels remain patent into the upper thighs. Veins: Dedicated venous phase imaging demonstrates engorged ment of the suprahepatic IVC. A potentially retrievable IVC filter is present in the infrarenal IVC. This appears to be a [employer]. No evidence of leg penetration, fracture or other complication. No thrombus. The IVC remains patent throughout its course. The iliac and visualized femoral veins are also patent. NON-VASCULAR Abdomen: Abnormal appearance of the liver with enlargement of the left lobe and relative atrophy of the right lobe. Additionally, there is a macronodular contour of the liver surface. Findings raise concern for cirrhotic change versus severe congestive hit the top of the related to chronic right heart failure. No discrete lesion identified. The gallbladder is surgically absent. No intra or extrahepatic biliary ductal dilatation. Unremarkable CT appearance of the stomach, duodenum, spleen, adrenal glands and pancreas. The native kidneys are atrophic. No evidence of obstruction or focal bowel wall thickening. Normal appendix in the right lower quadrant. The terminal ileum is unremarkable. A peritoneal dialysis catheter is coiled in the pelvic recess. There is small volume ascites which is not unexpected. Pelvis: Surgical changes of prior hysterectomy. Mild pelvic floor laxity. Free ascites related to peritoneal dialysis. Bones/Soft Tissues: No acute fracture or aggressive appearing lytic  or blastic osseous lesion. The bones appear mildly mottled diffusely. This appearance is favored to represent demineralization/osteopenia. Renal osteodystrophy is also a possibility. Review of the MIP images confirms the above findings. IMPRESSION: VASCULAR 1. Overall vascular findings are most suggestive of pulmonary arterial hypertension and right-sided heart dysfunction consistent with cor pulmonale. 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 2. No evidence of acute pulmonary embolus, aneurysm, or dissection. 3. Critical stenosis versus short focal occlusion of the origin of the celiac artery. The distal branch vessels remain patent due in part to collateralization through the pancreaticoduodenal arcade. 4. Atrophic renal arteries bilaterally with associated atrophy of the native kidneys. 5. Peripheral arterial disease with high-grade stenosis versus short segment occlusion of the left common iliac artery, moderate to high-grade stenosis of the proximal right common iliac artery, high-grade stenosis of the origin of the left internal iliac artery, and mild to moderate superficial femoral artery disease (worse on the left than the right) in the visualized segments of the upper thighs. 6. No evidence of venous thrombosis or obstruction. 7. Well-positioned infrarenal potentially retrievable IVC filter. NON VASCULAR 1. Cardiomegaly with right heart enlargement. 2. Mild interstitial pulmonary edema suggests congestive heart failure. 3. Large layering right pleural effusion with associated right lower lobe atelectasis. 4. Dependent atelectasis in the left lower lobe. 5. 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 Other etiologies for cirrhosis including but not limited to chronic hepatitis, alcoholism are not excluded radiographically. 6. Peritoneal dialysis catheter coiled in the pelvis with  associated ascites/dialysate. 7. Mild pelvic floor laxity. 8. Surgical changes of prior hysterectomy and cholecystectomy. 9. Diffusely mottled appearance of the  bones favored to represent either demineralization/osteopenia or renal osteodystrophy. 10. Atrophic native kidneys. Signed, Sterling Big, MD [facility] Radiology Electronically Signed   By: Malachy Moan M.D.   On: 02/08/2016 10:25   US Venous Img Lower Unilateral Right  02/07/2016  CLINICAL DATA:  Swelling. EXAM: RIGHT LOWER EXTREMITY VENOUS DOPPLER ULTRASOUND TECHNIQUE: Gray-scale sonography with graded compression, as well as color Doppler and duplex ultrasound were performed to evaluate the lower extremity deep venous systems from the level of the common femoral vein and including the common femoral, femoral, profunda femoral, popliteal and calf veins including the posterior tibial, peroneal and gastrocnemius veins when visible. The superficial great saphenous vein was also interrogated. Spectral Doppler was utilized to evaluate flow at rest and with distal augmentation maneuvers in the common femoral, femoral and popliteal veins. COMPARISON:  No prior. FINDINGS: Contralateral Common Femoral Vein: Respiratory phasicity is normal and symmetric with the symptomatic side. No evidence of thrombus. Normal compressibility. Common Femoral Vein: No evidence of thrombus. Normal compressibility, respiratory phasicity and response to augmentation. Saphenofemoral Junction: No evidence of thrombus. Normal compressibility and flow on color Doppler imaging. Profunda Femoral Vein: No evidence of thrombus. Normal compressibility and flow on color Doppler imaging. Femoral Vein: No evidence of thrombus. Normal compressibility, respiratory phasicity and response to augmentation. Popliteal Vein: No evidence of thrombus. Normal compressibility, respiratory phasicity and response to augmentation. Calf Veins: No  evidence of thrombus. Normal compressibility and flow on color Doppler imaging. Superficial Great Saphenous Vein: No evidence of thrombus. Normal compressibility and flow on color Doppler imaging. Venous Reflux:  None. Other Findings:  None. IMPRESSION: No evidence of deep venous thrombosis. Electronically Signed   By: Maisie Fus  Register   On: 02/07/2016 12:50   Ct Angio Abd/pel W/ And/or W/o  02/08/2016  CLINICAL DATA:  55 year old female with a history of possible systemic venous obstruction EXAM: CT ANGIOGRAPHY AND VENOGRAPHY CHEST, ABDOMEN AND PELVIS TECHNIQUE: Multidetector CT imaging through the chest, abdomen and pelvis was performed using the standard protocol during bolus administration of intravenous contrast. Multiplanar reconstructed images and MIPs were obtained and reviewed to evaluate the vascular anatomy. CONTRAST:  100 mL Omnipaque 350 COMPARISON:  Prior CT of abdomen and pelvis 06/22/2015; prior CT scan of the chest 02/09/2015 FINDINGS: CTA CHEST FINDINGS Mediastinum: Unremarkable CT appearance of the thyroid gland. No suspicious mediastinal or hilar adenopathy. No soft tissue mediastinal mass. The thoracic esophagus is unremarkable. Heart/Vascular: Pulmonary arterial phase imaging demonstrates excellent opacification of the pulmonary arteries to the proximal subsegmental level. There is no evidence of pulmonary embolus. The main pulmonary artery is enlarged measuring up to 3.6 cm in diameter. This is larger than the adjacent aorta. The heart is also markedly enlarged, particularly the right heart consistent with cor pulmonale. Case ease calcification is present along the mitral valve annulus. Atherosclerotic calcifications present along the left anterior descending, circumflex and right coronary arteries. No pericardial effusion. Lungs/Pleura: Large layering right pleural effusion with associated right lower lobe atelectasis. No definite pleural enhancement or nodularity. Perhaps mild interlobular  septal thickening and ground-glass attenuation airspace opacity in the lungs consistent with mild interstitial edema. Diffuse mild bronchial wall thickening. On the axial images, there is a somewhat nodular opacity in the medial aspect of the left lower lobe measuring 10 x 8 mm. However, on the coronal and sagittal reformatted images this opacity appears more linear and is therefore strongly favored to represent a region of atelectasis. Bones/Soft Tissues: No acute fracture or  aggressive appearing lytic or blastic osseous lesion. Review of the MIP images confirms the above findings. CTA ABDOMEN AND PELVIS FINDINGS VASCULAR Aorta: Normal caliber abdominal aorta without evidence of aneurysm. Scattered atherosclerotic plaque is present throughout. No significant stenosis or evidence of dissection. Celiac: High-grade stenosis versus occlusion of the proximal aspect of the celiac artery secondary to bulky and calcified atherosclerotic plaque. The distal branches reconstitute, possibly through collateralization via the pancreaticoduodenal arcade. SMA: Superior mesenteric artery is widely patent. Prominent pancreaticoduodenal arcade. Renals: The native kidneys are atretic bilaterally. The renal arteries are miniscule and not well seen. IMA: Atretic superior mesenteric artery.  No definite stenosis. Inflow: Calcified atherosclerotic plaque results in focal high-grade stenosis versus short segment occlusion in the left common iliac artery. There is a focal irregular calcified plaque extending into the proximal right common iliac artery resulting in an approximately 50% stenosis. High-grade stenosis of the origin of the left internal iliac artery. The right internal iliac artery is patent. The external iliac arteries are relatively disease free. Proximal Outflow: Diseased bilateral superficial femoral arteries. At least moderate stenosis in the proximal left SFA and mild stenosis in the proximal right SFA. The vessels remain  patent into the upper thighs. Veins: Dedicated venous phase imaging demonstrates engorged ment of the suprahepatic IVC. A potentially retrievable IVC filter is present in the infrarenal IVC. This appears to be a [employer]. No evidence of leg penetration, fracture or other complication. No thrombus. The IVC remains patent throughout its course. The iliac and visualized femoral veins are also patent. NON-VASCULAR Abdomen: Abnormal appearance of the liver with enlargement of the left lobe and relative atrophy of the right lobe. Additionally, there is a macronodular contour of the liver surface. Findings raise concern for cirrhotic change versus severe congestive hit the top of the related to chronic right heart failure. No discrete lesion identified. The gallbladder is surgically absent. No intra or extrahepatic biliary ductal dilatation. Unremarkable CT appearance of the stomach, duodenum, spleen, adrenal glands and pancreas. The native kidneys are atrophic. No evidence of obstruction or focal bowel wall thickening. Normal appendix in the right lower quadrant. The terminal ileum is unremarkable. A peritoneal dialysis catheter is coiled in the pelvic recess. There is small volume ascites which is not unexpected. Pelvis: Surgical changes of prior hysterectomy. Mild pelvic floor laxity. Free ascites related to peritoneal dialysis. Bones/Soft Tissues: No acute fracture or aggressive appearing lytic or blastic osseous lesion. The bones appear mildly mottled diffusely. This appearance is favored to represent demineralization/osteopenia. Renal osteodystrophy is also a possibility. Review of the MIP images confirms the above findings. IMPRESSION: VASCULAR 1. Overall vascular findings are most suggestive of pulmonary arterial hypertension and right-sided heart dysfunction consistent with cor pulmonale. This results in significant venous engorgement of the suprahepatic IVC and hepatic veins suspicious for congestive  hepatopathy and cardiac cirrhosis. 2. No evidence of acute pulmonary embolus, aneurysm, or dissection. 3. Critical stenosis versus short focal occlusion of the origin of the celiac artery. The distal branch vessels remain patent due in part to collateralization through the pancreaticoduodenal arcade. 4. Atrophic renal arteries bilaterally with associated atrophy of the native kidneys. 5. Peripheral arterial disease with high-grade stenosis versus short segment occlusion of the left common iliac artery, moderate to high-grade stenosis of the proximal right common iliac artery, high-grade stenosis of the origin of the left internal iliac artery, and mild to moderate superficial femoral artery disease (worse on the left than the right) in the visualized segments of the  upper thighs. 6. No evidence of venous thrombosis or obstruction. 7. Well-positioned infrarenal potentially retrievable IVC filter. NON VASCULAR 1. Cardiomegaly with right heart enlargement. 2. Mild interstitial pulmonary edema suggests congestive heart failure. 3. Large layering right pleural effusion with associated right lower lobe atelectasis. 4. Dependent atelectasis in the left lower lobe. 5. 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 Other etiologies for cirrhosis including but not limited to chronic hepatitis, alcoholism are not excluded radiographically. 6. Peritoneal dialysis catheter coiled in the pelvis with associated ascites/dialysate. 7. Mild pelvic floor laxity. 8. Surgical changes of prior hysterectomy and cholecystectomy. 9. Diffusely mottled appearance of the bones favored to represent either demineralization/osteopenia or renal osteodystrophy. 10. Atrophic native kidneys. Signed, Sterling Big, MD [facility] Electronically Signed   By: Malachy Moan M.D.   On: 02/08/2016 10:25   Assessment/Plan Dorothy Long is a 55 y.o. female with a  PMhx of HTN, HLD, COPD, ESRD (on PD), CVA (11/2014), atrial fib admitted for a history of AMS.  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 No vascular intervention indicated at this time. Celiac artery occlusion is not a contributing factor to patients heart failure causing back up of blood in venous system causing excessive friability and GI bleeding from mucosal damage.The distal branch vessels remain patent due in part to collateralization through the pancreaticoduodenal arcade. 2) Explained anatomy and physiology of "celiac artery occlusion with distal branch vessel collateralization" to patient and husband. Husband upset when told no vascular intervention was indicated at this time. Husband stating he was told "she is bleeding because of "vascular reasons" by another doctor. I spent almost 25 minutes explained the anatomy and physiology of the mesenteric arteries and why no intervention was needed.  3) Discussed with Dr. Romie Jumper, PA-C  02/08/2016 4:37 PM

## 2016-02-08 NOTE — Care Management Important Message (Signed)
Important Message  Patient Details  Name: Dorothy Long MRN: [id number] Date of Birth: 17-Aug-1961   Medicare Important Message Given:  Yes    [name] 02/08/2016, 1:37 PM

## 2016-02-08 NOTE — Progress Notes (Signed)
[facility] - [place] at [facility]   PATIENT NAME: Dorothy Long    MR#:  [id number]  DATE OF BIRTH:  1961-02-24  SUBJECTIVE:  CHIEF COMPLAINT:   Chief Complaint  Patient presents with  . Altered Mental Status   patient is 55 year old Afro-American female with past medical history significant for the. History of stroke who is on aspirin therapy at home , and residual disease on peritoneal dialysis, diabetes, recent treatment for gastrointestinal bleed at [facility] who presents to the hospital with complaints of altered mental status, weakness. In emergency room, hip, patient's hemoglobin level was found to be low at 4.5 and she was admitted, she was transfused packed red blood cells and her hemoglobin level improved to 7.0. Patient continues to have melanotic stool. She was noted to have right lower extremity swelling. Patient is status post IVC filter placement due to left common femoral vein DVT diagnosed 01/02/2016. Patient denies any significant discomfort. No more bleeding, hemoglobin level is stable after transfusion. 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  Patient was seen by cardiologist and felt that patient have left ventricle systolic dysfunction as well as valvular disease causing symptoms of weakness and fatigue, he recommended to consider treatment of stroke risk with occlusion device of left atrial appendage.  Review of Systems  Gastrointestinal: Positive for abdominal pain.    VITAL SIGNS: Blood  pressure 106/81, pulse 88, temperature 97.8 F (36.6 C), temperature source Oral, resp. rate 20, height 5\' 10"  (1.778 m), weight 57.063 kg (125 lb 12.8 oz), SpO2 99 %.  PHYSICAL EXAMINATION:   GENERAL:  55 y.o.-year-old patient lying in the bed with no acute distress. Sleepy and has some expressive aphasia, right-sided weakness EYES: Pupils equal, round, reactive to light and accommodation. No scleral icterus. Extraocular muscles intact.  HEENT: Head atraumatic, normocephalic. Oropharynx and nasopharynx clear.  NECK:  Supple, no jugular venous distention. No thyroid enlargement, no tenderness.  LUNGS: Normal breath sounds bilaterally, no wheezing, rales,rhonchi or crepitation. No use of accessory muscles of respiration.  CARDIOVASCULAR: S1, S2 4/6 systolic murmur precordium, mostly in apical area radiating to left axilla.  No rubs, or gallops. Jugular venous pressure is elevated and with engorgement of jugular veins ABDOMEN: Soft, epigastric tenderness on palpation but no rebound , but voluntary guarding was noted, nondistended. Bowel sounds present. No organomegaly or mass.  EXTREMITIES: No pedal edema, cyanosis, or clubbing.  NEUROLOGIC: Cranial nerves II through XII are grossly intact. Muscle strength, unable to evaluate due to significant somnolence. Sensation not able to evaluate. Gait not checked.  PSYCHIATRIC: The patient is somnolent and difficult to assess her orientation  SKIN: No obvious rash, lesion, or ulcer.   ORDERS/RESULTS REVIEWED:   CBC  Recent Labs Lab 02/06/16 1856 02/07/16 0717 02/07/16 1315 02/07/16 1933 02/08/16 0246 02/08/16 1249  WBC 8.1 9.4  --   --   --   --   HGB 4.5* 7.0* 7.1* 7.3* 8.6* 8.1*  HCT 14.1* 21.4*  --   --  25.1*  --   PLT 194 163  --   --   --   --   MCV 91.9 88.6  --   --   --   --  MCH 29.3 29.0  --   --   --   --   MCHC 31.9* 32.7  --   --   --   --   RDW 20.0* 17.1*  --   --   --   --     ------------------------------------------------------------------------------------------------------------------  Chemistries   Recent Labs Lab 02/06/16 1856 02/07/16 0717  NA 133* 134*  K 4.9 5.1  CL 98* 100*  CO2 18* 18*  GLUCOSE 88 103*  BUN 92* 95*  CREATININE 7.97* 8.09*  CALCIUM 8.2* 8.0*  AST 23  --   ALT 11*  --   ALKPHOS 91  --   BILITOT 0.8  --    ------------------------------------------------------------------------------------------------------------------ estimated creatinine clearance is 7.3 mL/min (by C-G formula based on Cr of 8.09). ------------------------------------------------------------------------------------------------------------------ No results for input(s): TSH, T4TOTAL, T3FREE, THYROIDAB in the last 72 hours.  Invalid input(s): FREET3  Cardiac Enzymes  Recent Labs Lab 02/06/16 1856  TROPONINI 0.35*   ------------------------------------------------------------------------------------------------------------------ Invalid input(s): POCBNP ---------------------------------------------------------------------------------------------------------------  RADIOLOGY: Ct Angio Chest Pe W/cm &/or Wo Cm  02/08/2016  CLINICAL DATA:  55 year old female with a history of possible systemic venous obstruction EXAM: CT ANGIOGRAPHY AND VENOGRAPHY CHEST, ABDOMEN AND PELVIS TECHNIQUE: Multidetector CT imaging through the chest, abdomen and pelvis was performed using the standard protocol during bolus administration of intravenous contrast. Multiplanar reconstructed images and MIPs were obtained and reviewed to evaluate the vascular anatomy. CONTRAST:  100 mL Omnipaque 350 COMPARISON:  Prior CT of abdomen and pelvis 06/22/2015; prior CT scan of the chest 02/09/2015 FINDINGS: CTA CHEST FINDINGS Mediastinum: Unremarkable CT appearance of the thyroid gland. No suspicious mediastinal or hilar adenopathy. No soft tissue mediastinal mass. The thoracic esophagus is  unremarkable. Heart/Vascular: Pulmonary arterial phase imaging demonstrates excellent opacification of the pulmonary arteries to the proximal subsegmental level. There is no evidence of pulmonary embolus. The main pulmonary artery is enlarged measuring up to 3.6 cm in diameter. This is larger than the adjacent aorta. The heart is also markedly enlarged, particularly the right heart consistent with cor pulmonale. Case ease calcification is present along the mitral valve annulus. Atherosclerotic calcifications present along the left anterior descending, circumflex and right coronary arteries. No pericardial effusion. Lungs/Pleura: Large layering right pleural effusion with associated right lower lobe atelectasis. No definite pleural enhancement or nodularity. Perhaps mild interlobular septal thickening and ground-glass attenuation airspace opacity in the lungs consistent with mild interstitial edema. Diffuse mild bronchial wall thickening. On the axial images, there is a somewhat nodular opacity in the medial aspect of the left lower lobe measuring 10 x 8 mm. However, on the coronal and sagittal reformatted images this opacity appears more linear and is therefore strongly favored to represent a region of atelectasis. Bones/Soft Tissues: No acute fracture or aggressive appearing lytic or blastic osseous lesion. Review of the MIP images confirms the above findings. CTA ABDOMEN AND PELVIS FINDINGS VASCULAR Aorta: Normal caliber abdominal aorta without evidence of aneurysm. Scattered atherosclerotic plaque is present throughout. No significant stenosis or evidence of dissection. Celiac: High-grade stenosis versus occlusion of the proximal aspect of the celiac artery secondary to bulky and calcified atherosclerotic plaque. The distal branches reconstitute, possibly through collateralization via the pancreaticoduodenal arcade. SMA: Superior mesenteric artery is widely patent. Prominent pancreaticoduodenal arcade. Renals:  The native kidneys are atretic bilaterally. The renal arteries are miniscule and not well seen. IMA: Atretic superior mesenteric artery.  No definite stenosis. Inflow: Calcified atherosclerotic plaque results in focal high-grade stenosis versus short segment occlusion in the left  common iliac artery. There is a focal irregular calcified plaque extending into the proximal right common iliac artery resulting in an approximately 50% stenosis. High-grade stenosis of the origin of the left internal iliac artery. The right internal iliac artery is patent. The external iliac arteries are relatively disease free. Proximal Outflow: Diseased bilateral superficial femoral arteries. At least moderate stenosis in the proximal left SFA and mild stenosis in the proximal right SFA. The vessels remain patent into the upper thighs. Veins: Dedicated venous phase imaging demonstrates engorged ment of the suprahepatic IVC. A potentially retrievable IVC filter is present in the infrarenal IVC. This appears to be a [employer]. No evidence of leg penetration, fracture or other complication. No thrombus. The IVC remains patent throughout its course. The iliac and visualized femoral veins are also patent. NON-VASCULAR Abdomen: Abnormal appearance of the liver with enlargement of the left lobe and relative atrophy of the right lobe. Additionally, there is a macronodular contour of the liver surface. Findings raise concern for cirrhotic change versus severe congestive hit the top of the related to chronic right heart failure. No discrete lesion identified. The gallbladder is surgically absent. No intra or extrahepatic biliary ductal dilatation. Unremarkable CT appearance of the stomach, duodenum, spleen, adrenal glands and pancreas. The native kidneys are atrophic. No evidence of obstruction or focal bowel wall thickening. Normal appendix in the right lower quadrant. The terminal ileum is unremarkable. A peritoneal dialysis catheter is  coiled in the pelvic recess. There is small volume ascites which is not unexpected. Pelvis: Surgical changes of prior hysterectomy. Mild pelvic floor laxity. Free ascites related to peritoneal dialysis. Bones/Soft Tissues: No acute fracture or aggressive appearing lytic or blastic osseous lesion. The bones appear mildly mottled diffusely. This appearance is favored to represent demineralization/osteopenia. Renal osteodystrophy is also a possibility. Review of the MIP images confirms the above findings. IMPRESSION: VASCULAR 1. Overall vascular findings are most suggestive of pulmonary arterial hypertension and right-sided heart dysfunction consistent with cor pulmonale. 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 2. No evidence of acute pulmonary embolus, aneurysm, or dissection. 3. Critical stenosis versus short focal occlusion of the origin of the celiac artery. The distal branch vessels remain patent due in part to collateralization through the pancreaticoduodenal arcade. 4. Atrophic renal arteries bilaterally with associated atrophy of the native kidneys. 5. Peripheral arterial disease with high-grade stenosis versus short segment occlusion of the left common iliac artery, moderate to high-grade stenosis of the proximal right common iliac artery, high-grade stenosis of the origin of the left internal iliac artery, and mild to moderate superficial femoral artery disease (worse on the left than the right) in the visualized segments of the upper thighs. 6. No evidence of venous thrombosis or obstruction. 7. Well-positioned infrarenal potentially retrievable IVC filter. NON VASCULAR 1. Cardiomegaly with right heart enlargement. 2. Mild interstitial pulmonary edema suggests congestive heart failure. 3. Large layering right pleural effusion with associated right lower lobe atelectasis. 4. Dependent atelectasis in  the left lower lobe. 5. 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 Other etiologies for cirrhosis including but not limited to chronic hepatitis, alcoholism are not excluded radiographically. 6. Peritoneal dialysis catheter coiled in the pelvis with associated ascites/dialysate. 7. Mild pelvic floor laxity. 8. Surgical changes of prior hysterectomy and cholecystectomy. 9. Diffusely mottled appearance of the bones favored to represent either demineralization/osteopenia or renal osteodystrophy. 10. Atrophic native kidneys. Signed, Sterling Big,  MD [facility] Electronically Signed   By: Malachy Moan M.D.   On: 02/08/2016 10:25   US Venous Img Lower Unilateral Right  02/07/2016  CLINICAL DATA:  Swelling. EXAM: RIGHT LOWER EXTREMITY VENOUS DOPPLER ULTRASOUND TECHNIQUE: Gray-scale sonography with graded compression, as well as color Doppler and duplex ultrasound were performed to evaluate the lower extremity deep venous systems from the level of the common femoral vein and including the common femoral, femoral, profunda femoral, popliteal and calf veins including the posterior tibial, peroneal and gastrocnemius veins when visible. The superficial great saphenous vein was also interrogated. Spectral Doppler was utilized to evaluate flow at rest and with distal augmentation maneuvers in the common femoral, femoral and popliteal veins. COMPARISON:  No prior. FINDINGS: Contralateral Common Femoral Vein: Respiratory phasicity is normal and symmetric with the symptomatic side. No evidence of thrombus. Normal compressibility. Common Femoral Vein: No evidence of thrombus. Normal compressibility, respiratory phasicity and response to augmentation. Saphenofemoral Junction: No evidence of thrombus. Normal compressibility and flow on color Doppler imaging. Profunda Femoral Vein: No evidence of thrombus. Normal compressibility and flow on  color Doppler imaging. Femoral Vein: No evidence of thrombus. Normal compressibility, respiratory phasicity and response to augmentation. Popliteal Vein: No evidence of thrombus. Normal compressibility, respiratory phasicity and response to augmentation. Calf Veins: No evidence of thrombus. Normal compressibility and flow on color Doppler imaging. Superficial Great Saphenous Vein: No evidence of thrombus. Normal compressibility and flow on color Doppler imaging. Venous Reflux:  None. Other Findings:  None. IMPRESSION: No evidence of deep venous thrombosis. Electronically Signed   By: Maisie Fus  Register   On: 02/07/2016 12:50   Ct Angio Abd/pel W/ And/or W/o  02/08/2016  CLINICAL DATA:  55 year old female with a history of possible systemic venous obstruction EXAM: CT ANGIOGRAPHY AND VENOGRAPHY CHEST, ABDOMEN AND PELVIS TECHNIQUE: Multidetector CT imaging through the chest, abdomen and pelvis was performed using the standard protocol during bolus administration of intravenous contrast. Multiplanar reconstructed images and MIPs were obtained and reviewed to evaluate the vascular anatomy. CONTRAST:  100 mL Omnipaque 350 COMPARISON:  Prior CT of abdomen and pelvis 06/22/2015; prior CT scan of the chest 02/09/2015 FINDINGS: CTA CHEST FINDINGS Mediastinum: Unremarkable CT appearance of the thyroid gland. No suspicious mediastinal or hilar adenopathy. No soft tissue mediastinal mass. The thoracic esophagus is unremarkable. Heart/Vascular: Pulmonary arterial phase imaging demonstrates excellent opacification of the pulmonary arteries to the proximal subsegmental level. There is no evidence of pulmonary embolus. The main pulmonary artery is enlarged measuring up to 3.6 cm in diameter. This is larger than the adjacent aorta. The heart is also markedly enlarged, particularly the right heart consistent with cor pulmonale. Case ease calcification is present along the mitral valve annulus. Atherosclerotic calcifications present  along the left anterior descending, circumflex and right coronary arteries. No pericardial effusion. Lungs/Pleura: Large layering right pleural effusion with associated right lower lobe atelectasis. No definite pleural enhancement or nodularity. Perhaps mild interlobular septal thickening and ground-glass attenuation airspace opacity in the lungs consistent with mild interstitial edema. Diffuse mild bronchial wall thickening. On the axial images, there is a somewhat nodular opacity in the medial aspect of the left lower lobe measuring 10 x 8 mm. However, on the coronal and sagittal reformatted images this opacity appears more linear and is therefore strongly favored to represent a region of atelectasis. Bones/Soft Tissues: No acute fracture or aggressive appearing lytic or blastic osseous lesion. Review of the MIP images confirms the above findings. CTA  ABDOMEN AND PELVIS FINDINGS VASCULAR Aorta: Normal caliber abdominal aorta without evidence of aneurysm. Scattered atherosclerotic plaque is present throughout. No significant stenosis or evidence of dissection. Celiac: High-grade stenosis versus occlusion of the proximal aspect of the celiac artery secondary to bulky and calcified atherosclerotic plaque. The distal branches reconstitute, possibly through collateralization via the pancreaticoduodenal arcade. SMA: Superior mesenteric artery is widely patent. Prominent pancreaticoduodenal arcade. Renals: The native kidneys are atretic bilaterally. The renal arteries are miniscule and not well seen. IMA: Atretic superior mesenteric artery.  No definite stenosis. Inflow: Calcified atherosclerotic plaque results in focal high-grade stenosis versus short segment occlusion in the left common iliac artery. There is a focal irregular calcified plaque extending into the proximal right common iliac artery resulting in an approximately 50% stenosis. High-grade stenosis of the origin of the left internal iliac artery. The right  internal iliac artery is patent. The external iliac arteries are relatively disease free. Proximal Outflow: Diseased bilateral superficial femoral arteries. At least moderate stenosis in the proximal left SFA and mild stenosis in the proximal right SFA. The vessels remain patent into the upper thighs. Veins: Dedicated venous phase imaging demonstrates engorged ment of the suprahepatic IVC. A potentially retrievable IVC filter is present in the infrarenal IVC. This appears to be a [employer]. No evidence of leg penetration, fracture or other complication. No thrombus. The IVC remains patent throughout its course. The iliac and visualized femoral veins are also patent. NON-VASCULAR Abdomen: Abnormal appearance of the liver with enlargement of the left lobe and relative atrophy of the right lobe. Additionally, there is a macronodular contour of the liver surface. Findings raise concern for cirrhotic change versus severe congestive hit the top of the related to chronic right heart failure. No discrete lesion identified. The gallbladder is surgically absent. No intra or extrahepatic biliary ductal dilatation. Unremarkable CT appearance of the stomach, duodenum, spleen, adrenal glands and pancreas. The native kidneys are atrophic. No evidence of obstruction or focal bowel wall thickening. Normal appendix in the right lower quadrant. The terminal ileum is unremarkable. A peritoneal dialysis catheter is coiled in the pelvic recess. There is small volume ascites which is not unexpected. Pelvis: Surgical changes of prior hysterectomy. Mild pelvic floor laxity. Free ascites related to peritoneal dialysis. Bones/Soft Tissues: No acute fracture or aggressive appearing lytic or blastic osseous lesion. The bones appear mildly mottled diffusely. This appearance is favored to represent demineralization/osteopenia. Renal osteodystrophy is also a possibility. Review of the MIP images confirms the above findings. IMPRESSION:  VASCULAR 1. Overall vascular findings are most suggestive of pulmonary arterial hypertension and right-sided heart dysfunction consistent with cor pulmonale. 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 2. No evidence of acute pulmonary embolus, aneurysm, or dissection. 3. Critical stenosis versus short focal occlusion of the origin of the celiac artery. The distal branch vessels remain patent due in part to collateralization through the pancreaticoduodenal arcade. 4. Atrophic renal arteries bilaterally with associated atrophy of the native kidneys. 5. Peripheral arterial disease with high-grade stenosis versus short segment occlusion of the left common iliac artery, moderate to high-grade stenosis of the proximal right common iliac artery, high-grade stenosis of the origin of the left internal iliac artery, and mild to moderate superficial femoral artery disease (worse on the left than the right) in the visualized segments of the upper thighs. 6. No evidence of venous thrombosis or obstruction. 7. Well-positioned infrarenal potentially retrievable IVC filter.  NON VASCULAR 1. Cardiomegaly with right heart enlargement. 2. Mild interstitial pulmonary edema suggests congestive heart failure. 3. Large layering right pleural effusion with associated right lower lobe atelectasis. 4. Dependent atelectasis in the left lower lobe. 5. 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 Other etiologies for cirrhosis including but not limited to chronic hepatitis, alcoholism are not excluded radiographically. 6. Peritoneal dialysis catheter coiled in the pelvis with associated ascites/dialysate. 7. Mild pelvic floor laxity. 8. Surgical changes of prior hysterectomy and cholecystectomy. 9. Diffusely mottled appearance of the bones favored to represent either demineralization/osteopenia or renal osteodystrophy. 10.  Atrophic native kidneys. Signed, Sterling Big, MD [facility]iology Electronically Signed   By: Malachy Moan M.D.   On: 02/08/2016 10:25    EKG:  Orders placed or performed during the hospital encounter of 02/06/16  . ED EKG  . ED EKG    ASSESSMENT AND PLAN:  Active Problems:   Upper GI bleed  #1. Acute posthemorrhagic anemia, status post packed red blood cell transfusion, hemoglobin has improved after transfusion, following patient's hemoglobin level in the morning. #2. Gastrointestinal bleed, likely due to peptic ulcer disease, status post recent EGD in [facility] revealing LA grade A esophagitis, gastritis, duodenitis, friable mucosa, concerning for portal hypertension, continue Protonix intravenously, gastroenterology consultation is appreciated. Oral intake to be initiated by gastroenterologist. Bleeding has stopped #3. End-stage renal disease, continue peritoneal dialysis per nephrologist, input is appreciated #4. Hyponatremia, follow with therapy, recheck labs tomorrow #5. Recent stroke, patient's aspirin is on hold. Continue Lipitor. #6. Diabetes mellitus, continue sliding scale insulin as patient is nothing by mouth, blood glucose is ranging around 100 #7. Peripheral vascular disease, getting vascular surgery involved for further recommendations. #8. Cardiomyopathy with Mitral valve prolapse, patient was seen by cardiologist and recommended possible occlusion device for left atrial appendage occlusion  to decrease sroke risks, patient is to receive a peritoneal dialysis for fluid overload   #9. Liver cirrhosis, supportive therapy, get ammonia level    Management plans discussed with the patient, family and they are in agreement.   DRUG ALLERGIES:  Allergies  Allergen Reactions  . Ciprocinonide [Fluocinolone] Hives  . Demerol [Meperidine] Nausea And Vomiting  . Diflucan [Fluconazole] Nausea Only  . Flagyl  [Metronidazole] Hives and Itching  . Hydrocodone Itching  . Levaquin [Levofloxacin In D5w] Other (See Comments)    Numbness in legs  . Morphine And Related Nausea And Vomiting  . Tetracyclines & Related Itching and Swelling  . Valium [Diazepam] Nausea Only    hallucinations    CODE STATUS:     Code Status Orders        Start     Ordered   02/06/16 2218  Do not attempt resuscitation (DNR)   Continuous    Question Answer Comment  In the event of cardiac or respiratory ARREST Do not call a "code blue"   In the event of cardiac or respiratory ARREST Do not perform Intubation, CPR, defibrillation or ACLS   In the event of cardiac or respiratory ARREST Use medication by any route, position, wound care, and other measures to relive pain and suffering. May use oxygen, suction and manual treatment of airway obstruction as needed for comfort.      02/06/16 2218    Code Status History    Date Active Date Inactive Code Status Order ID Comments User Context   12/11/2015  3:28 AM 12/19/2015  4:53 PM Full Code [id number]  Ihor Austin, MD  Inpatient   11/17/2015  4:50 PM 11/20/2015  3:18 PM Full Code [id number]  Houston Siren, MD Inpatient   10/11/2015  9:32 PM 10/12/2015  5:10 PM Full Code [id number]  Oralia Manis, MD Inpatient   06/23/2015 12:43 AM 06/24/2015  2:21 PM Full Code [id number]  Oralia Manis, MD Inpatient   06/15/2015  2:57 AM 06/16/2015  4:01 PM Full Code [id number]  Wyatt Haste, MD ED      TOTAL TIME TAKING CARE OF THIS PATIENT: . 50 minutes.  Discussed with  Dr. Mechele Collin, Dr. Thedore Mins extensively about patient's treatment plan, time spent approximately 15 minutes. On discussions   Rourke Mcquitty M.D on 02/08/2016 at 4:15 PM  Between 7am to 6pm - Pager - [phone]  After 6pm go to [email or web address] - password [email or web address] [facility]  [facility]  Office  [phone]  CC: Primary care physician; Sherlene Shams, MD

## 2016-02-08 NOTE — Plan of Care (Signed)
Problem: Fluid Volume: Goal: Will show no signs and symptoms of excessive bleeding Outcome: Progressing Pt started clear liqs then full liqs today. egd  Cancelled. Cardio and vascular  Saw pt today.  Both spoke with pts spouse re  Condition. hgb 8.1  Received 1 unit pcs last  Pm.  Nightly peritoneal dialysis.  1st tx  Last pm. 1 liter drawn off. tol well.

## 2016-02-08 NOTE — Consult Note (Signed)
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  Also valvular disease contributing to the venous back pressure.  I briefly discussed with husband that the cause for her recurrent GI bleeding was due to these problems.  Prognosis not good.  Recommend BID PPI therapy, avoid all NSAID.  Transfuse as needed.  Dr. Shelle Iron on call this weekend and will see.  Check daily hgb or CBC while in hospital.

## 2016-02-08 NOTE — Care Management (Signed)
Admitted to [facility] with the diagnosis of upper GI Bleed. Lives with husband, Dewright [phone]). Discharged from this facility to Acute Inpatient Rehabilitation at [facility] 12/19/15. Discharged from Acute Inpatient Rehab 01/24/16. Followed by [facility] for Occupational therapy, physical therapy, ans speech. Primary care physician is Dr. Darrick Huntsman. Last seen Dr. Cherylann Ratel on Monday, Peritoneal dialysis x 2 years. Uses a rolling walker to aid in ambulation. [facility] in the past. No falls. Appetite was good until this episode. Ambulation with rolling walker in the home. Husband will transport.  Gwenette Greet RN MSN CCM Care Management [phone]

## 2016-02-08 NOTE — Consult Note (Signed)
Awaiting results of CT angiograms.  No planned EGD today at this time.  Continue current meds.

## 2016-02-08 NOTE — Consult Note (Signed)
[facility] Cardiology Consultation Note  Patient ID: Dorothy Long, MRN: [id number], DOB/AGE: 1961/03/16 55 y.o. Admit date: 02/06/2016   Date of Consult: 02/08/2016 Primary Physician: Sherlene Shams, MD Primary Cardiologist: None  Chief Complaint:  Chief Complaint  Patient presents with  . Altered Mental Status   Reason for Consult: cardiac murmur with mitral valve prolapse  HPI: 55 y.o. female with the history of mitral valve prolapse and apparent cardiac murmur who is had a history of stroke in the past for which she was placed on anticoagulation. After anticoagulation use the patient had multiple episodes of bleeding. The bleeding was caused by melena and had a hemoglobin which was very low requiring packed red blood cells. The patient has since been very weak and unable to do any physical activity with shortness of breath and concerns of possible cardiovascular causes. The patient on exam does have the significant murmur and RV heave consistent with possible congestive heart failure  Past Medical History  Diagnosis Date  . Hypertension   . Hyperlipidemia   . COPD (chronic obstructive pulmonary disease) (HCC)     a. not on home O2  . Anemia   . Valvular disease     a. 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 echo 2015: EF 45-50%, borderline LVH, mod dilated LA, mildly dilated RA, small pericardial effusion, mod MR (rheumatic deformity), MS mild-mod, no gradient, PASP ele  . Chronic combined systolic and diastolic CHF (congestive heart failure) (HCC)     a. echo reports above  . PAF (paroxysmal atrial fibrillation) (HCC)     a. in setting of diarrhea 09/21/2013; b. not on long term full dose anticoagulation; c. CHADSVASC at least 4 (CHF, HTN, DM, female)  . Diabetes mellitus (HCC)   . Peritoneal  dialysis status (HCC) 4- 15-15  . Ventral hernia   . Inguinal hernia   . History of stress test     a. 2014: no convincing evidence of pharmacologically induced ischemia,  apparent ant apical defect present only on attenuation corrected images favored to reflect artifact due to overcorrection & may be worse on stress imaging secondary to greater patient motion. A small area of ischemia is felt less likely but is difficult to entirely exclude, EF 55%  . ESRD (end stage renal disease) on dialysis (HCC)     a. HD on T,T,S; b. 2/2 focal segmental glomerulosclerosis   . Dialysis patient (HCC)   . Renal insufficiency   . Stroke [facility]) 11/2015    a. MRI L cereblal infarctions in the MCA/PCA, ACA/MCA territory; b. felt to be carotid in etiology; c. medically managed on DAPT per neuro      Surgical History:  Past Surgical History  Procedure Laterality Date  . Abdominal hysterectomy    . Cholecystectomy    . Knee surgery Right   . Foot surgery Bilateral   . Av fistula placement Left 10-26-13    Dr Gilda Crease  . Peritoneal catheter insertion  03-29-14    Dr Gilda Crease  . Inguinal hernia repair Left 07/13/2015    Procedure: HERNIA REPAIR INGUINAL ADULT;  Surgeon: Earline Mayotte, MD;  Location: [facility] ORS;  Service: General;  Laterality: Left;  Marland Kitchen Ventral hernia repair N/A 07/13/2015    Procedure: HERNIA REPAIR VENTRAL ADULT;  Surgeon: Earline Mayotte, MD;  Location: [facility] ORS;  Service: General;  Laterality: N/A;     Home Meds: Prior to Admission medications   Medication Sig Start Date End Date Taking? Authorizing Provider  acetaminophen (TYLENOL) 325 MG tablet Take 325 mg by mouth every 4 (four) hours as needed for mild pain.    Yes Historical Provider, MD  albuterol (VENTOLIN HFA) 108 (90 BASE) MCG/ACT inhaler Inhale 1 puff into the lungs every 4 (four) hours as needed for wheezing or shortness of breath.    Yes Historical Provider, MD  aspirin 81 MG chewable tablet Chew 81 mg by mouth daily.   Yes  Historical Provider, MD  atorvastatin (LIPITOR) 80 MG tablet Take 1 tablet by mouth daily. 01/24/16  Yes Historical Provider, MD  calcitRIOL (ROCALTROL) 0.5 MCG capsule Take 1 capsule by mouth daily. 01/24/16  Yes Historical Provider, MD  Calcium Acetate 667 MG TABS Take 3 capsules by mouth 3 (three) times daily.   Yes Historical Provider, MD  cinacalcet (SENSIPAR) 30 MG tablet Take 30 mg by mouth daily.   Yes Historical Provider, MD  citalopram (CELEXA) 20 MG tablet Take 1 tablet by mouth daily. 01/24/16  Yes Historical Provider, MD  docusate sodium (COLACE) 100 MG capsule Take 100 mg by mouth daily.   Yes Historical Provider, MD  epoetin alfa (EPOGEN,PROCRIT) 16109 UNIT/ML injection Inject 25,000 Units into the skin once a week.   Yes Historical Provider, MD  ondansetron (ZOFRAN-ODT) 4 MG disintegrating tablet Take 4 mg by mouth every 12 (twelve) hours as needed for nausea or vomiting.   Yes Historical Provider, MD  pantoprazole (PROTONIX) 40 MG tablet Take 1 tablet by mouth 2 (two) times daily. 01/24/16  Yes Historical Provider, MD  sodium chloride 1 g tablet Take 2 g by mouth 2 (two) times daily with a meal.   Yes Historical Provider, MD  tiotropium (SPIRIVA) 18 MCG inhalation capsule Place 1 capsule (18 mcg total) into inhaler and inhale daily. 08/22/15  Yes Carollee Leitz, NP    Inpatient Medications:  . sodium chloride   Intravenous Once  . atorvastatin  80 mg Oral q1800  . dialysis solution 1.5% low-MG/low-CA   Intraperitoneal Q24H  . gentamicin ointment   Topical Daily  . insulin aspart  0-9 Units Subcutaneous 6 times per day  . sodium chloride flush  3 mL Intravenous Q12H  . sucralfate  1 g Oral 4 times per day   . pantoprozole (PROTONIX) infusion 8 mg/hr (02/07/16 2121)    Allergies:  Allergies  Allergen Reactions  . Ciprocinonide [Fluocinolone] Hives  . Demerol [Meperidine] Nausea And Vomiting  . Diflucan [Fluconazole] Nausea Only  . Flagyl [Metronidazole] Hives and Itching  .  Hydrocodone Itching  . Levaquin [Levofloxacin In D5w] Other (See Comments)    Numbness in legs  . Morphine And Related Nausea And Vomiting  . Tetracyclines & Related Itching and Swelling  . Valium [Diazepam] Nausea Only    hallucinations    Social History   Social History  . Marital Status: Married    Spouse Name: N/A  . Number of Children: N/A  . Years of Education: N/A   Occupational History  . disabled    Social History Main Topics  . Smoking status: Current Some Day Smoker -- 0.50 packs/day for 25 years    Types: Cigarettes  . Smokeless tobacco: Never Used  . Alcohol Use: No     Comment: occasional  . Drug Use: No  . Sexual Activity: No   Other Topics  Concern  . Not on file   Social History Narrative     Family History  Problem Relation Age of Onset  . Cancer Mother     colon  . Heart disease Father   . Hypertension Father      Review of Systems Positive for shortness of breath weakness fatigue melena Negative for: General:  chills, fever, night sweats or weight changes.  Cardiovascular: PND orthopnea syncope dizziness  Dermatological skin lesions rashes Respiratory: Cough congestion Urologic: Frequent urination urination at night and hematuria Abdominal: negative for nausea, vomiting, diarrhea, bright red blood per rectum,  or hematemesis Neurologic: negative for visual changes, and/or hearing changes  All other systems reviewed and are otherwise negative except as noted above.  Labs:  Recent Labs  02/06/16 1856  TROPONINI 0.35*   Lab Results  Component Value Date   WBC 9.4 02/07/2016   HGB 8.1* 02/08/2016   HCT 25.1* 02/08/2016   MCV 88.6 02/07/2016   PLT 163 02/07/2016    Recent Labs Lab 02/06/16 1856 02/07/16 0717  NA 133* 134*  K 4.9 5.1  CL 98* 100*  CO2 18* 18*  BUN 92* 95*  CREATININE 7.97* 8.09*  CALCIUM 8.2* 8.0*  PROT 5.1*  --   BILITOT 0.8  --   ALKPHOS 91  --   ALT 11*  --   AST 23  --   GLUCOSE 88 103*   Lab  Results  Component Value Date   CHOL 137 12/11/2015   HDL 42 12/11/2015   LDLCALC 81 12/11/2015   TRIG 71 12/11/2015   No results found for: DDIMER  Radiology/Studies:  Ct Angio Chest Pe W/cm &/or Wo Cm  02/08/2016  CLINICAL DATA:  54 year old female with a history of possible systemic venous obstruction EXAM: CT ANGIOGRAPHY AND VENOGRAPHY CHEST, ABDOMEN AND PELVIS TECHNIQUE: Multidetector CT imaging through the chest, abdomen and pelvis was performed using the standard protocol during bolus administration of intravenous contrast. Multiplanar reconstructed images and MIPs were obtained and reviewed to evaluate the vascular anatomy. CONTRAST:  100 mL Omnipaque 350 COMPARISON:  Prior CT of abdomen and pelvis 06/22/2015; prior CT scan of the chest 02/09/2015 FINDINGS: CTA CHEST FINDINGS Mediastinum: Unremarkable CT appearance of the thyroid gland. No suspicious mediastinal or hilar adenopathy. No soft tissue mediastinal mass. The thoracic esophagus is unremarkable. Heart/Vascular: Pulmonary arterial phase imaging demonstrates excellent opacification of the pulmonary arteries to the proximal subsegmental level. There is no evidence of pulmonary embolus. The main pulmonary artery is enlarged measuring up to 3.6 cm in diameter. This is larger than the adjacent aorta. The heart is also markedly enlarged, particularly the right heart consistent with cor pulmonale. Case ease calcification is present along the mitral valve annulus. Atherosclerotic calcifications present along the left anterior descending, circumflex and right coronary arteries. No pericardial effusion. Lungs/Pleura: Large layering right pleural effusion with associated right lower lobe atelectasis. No definite pleural enhancement or nodularity. Perhaps mild interlobular septal thickening and ground-glass attenuation airspace opacity in the lungs consistent with mild interstitial edema. Diffuse mild bronchial wall thickening. On the axial images, there  is a somewhat nodular opacity in the medial aspect of the left lower lobe measuring 10 x 8 mm. However, on the coronal and sagittal reformatted images this opacity appears more linear and is therefore strongly favored to represent a region of atelectasis. Bones/Soft Tissues: No acute fracture or aggressive appearing lytic or blastic osseous lesion. Review of the MIP images confirms the above findings. CTA ABDOMEN AND PELVIS  FINDINGS VASCULAR Aorta: Normal caliber abdominal aorta without evidence of aneurysm. Scattered atherosclerotic plaque is present throughout. No significant stenosis or evidence of dissection. Celiac: High-grade stenosis versus occlusion of the proximal aspect of the celiac artery secondary to bulky and calcified atherosclerotic plaque. The distal branches reconstitute, possibly through collateralization via the pancreaticoduodenal arcade. SMA: Superior mesenteric artery is widely patent. Prominent pancreaticoduodenal arcade. Renals: The native kidneys are atretic bilaterally. The renal arteries are miniscule and not well seen. IMA: Atretic superior mesenteric artery.  No definite stenosis. Inflow: Calcified atherosclerotic plaque results in focal high-grade stenosis versus short segment occlusion in the left common iliac artery. There is a focal irregular calcified plaque extending into the proximal right common iliac artery resulting in an approximately 50% stenosis. High-grade stenosis of the origin of the left internal iliac artery. The right internal iliac artery is patent. The external iliac arteries are relatively disease free. Proximal Outflow: Diseased bilateral superficial femoral arteries. At least moderate stenosis in the proximal left SFA and mild stenosis in the proximal right SFA. The vessels remain patent into the upper thighs. Veins: Dedicated venous phase imaging demonstrates engorged ment of the suprahepatic IVC. A potentially retrievable IVC filter is present in the infrarenal  IVC. This appears to be a [employer]. No evidence of leg penetration, fracture or other complication. No thrombus. The IVC remains patent throughout its course. The iliac and visualized femoral veins are also patent. NON-VASCULAR Abdomen: Abnormal appearance of the liver with enlargement of the left lobe and relative atrophy of the right lobe. Additionally, there is a macronodular contour of the liver surface. Findings raise concern for cirrhotic change versus severe congestive hit the top of the related to chronic right heart failure. No discrete lesion identified. The gallbladder is surgically absent. No intra or extrahepatic biliary ductal dilatation. Unremarkable CT appearance of the stomach, duodenum, spleen, adrenal glands and pancreas. The native kidneys are atrophic. No evidence of obstruction or focal bowel wall thickening. Normal appendix in the right lower quadrant. The terminal ileum is unremarkable. A peritoneal dialysis catheter is coiled in the pelvic recess. There is small volume ascites which is not unexpected. Pelvis: Surgical changes of prior hysterectomy. Mild pelvic floor laxity. Free ascites related to peritoneal dialysis. Bones/Soft Tissues: No acute fracture or aggressive appearing lytic or blastic osseous lesion. The bones appear mildly mottled diffusely. This appearance is favored to represent demineralization/osteopenia. Renal osteodystrophy is also a possibility. Review of the MIP images confirms the above findings. IMPRESSION: VASCULAR 1. Overall vascular findings are most suggestive of pulmonary arterial hypertension and right-sided heart dysfunction consistent with cor pulmonale. 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 2. No evidence of acute pulmonary embolus, aneurysm, or dissection. 3. Critical stenosis versus short focal occlusion of the origin of the celiac artery.  The distal branch vessels remain patent due in part to collateralization through the pancreaticoduodenal arcade. 4. Atrophic renal arteries bilaterally with associated atrophy of the native kidneys. 5. Peripheral arterial disease with high-grade stenosis versus short segment occlusion of the left common iliac artery, moderate to high-grade stenosis of the proximal right common iliac artery, high-grade stenosis of the origin of the left internal iliac artery, and mild to moderate superficial femoral artery disease (worse on the left than the right) in the visualized segments of the upper thighs. 6. No evidence of venous thrombosis or obstruction. 7. Well-positioned infrarenal potentially retrievable IVC filter. NON VASCULAR 1.  Cardiomegaly with right heart enlargement. 2. Mild interstitial pulmonary edema suggests congestive heart failure. 3. Large layering right pleural effusion with associated right lower lobe atelectasis. 4. Dependent atelectasis in the left lower lobe. 5. 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 Other etiologies for cirrhosis including but not limited to chronic hepatitis, alcoholism are not excluded radiographically. 6. Peritoneal dialysis catheter coiled in the pelvis with associated ascites/dialysate. 7. Mild pelvic floor laxity. 8. Surgical changes of prior hysterectomy and cholecystectomy. 9. Diffusely mottled appearance of the bones favored to represent either demineralization/osteopenia or renal osteodystrophy. 10. Atrophic native kidneys. Signed, Sterling Big, MD [facility] Electronically Signed   By: Malachy Moan M.D.   On: 02/08/2016 10:25   US Venous Img Lower Unilateral Right  02/07/2016  CLINICAL DATA:  Swelling. EXAM: RIGHT LOWER EXTREMITY VENOUS DOPPLER ULTRASOUND TECHNIQUE: Gray-scale sonography with graded compression, as well as color Doppler and duplex ultrasound were performed to  evaluate the lower extremity deep venous systems from the level of the common femoral vein and including the common femoral, femoral, profunda femoral, popliteal and calf veins including the posterior tibial, peroneal and gastrocnemius veins when visible. The superficial great saphenous vein was also interrogated. Spectral Doppler was utilized to evaluate flow at rest and with distal augmentation maneuvers in the common femoral, femoral and popliteal veins. COMPARISON:  No prior. FINDINGS: Contralateral Common Femoral Vein: Respiratory phasicity is normal and symmetric with the symptomatic side. No evidence of thrombus. Normal compressibility. Common Femoral Vein: No evidence of thrombus. Normal compressibility, respiratory phasicity and response to augmentation. Saphenofemoral Junction: No evidence of thrombus. Normal compressibility and flow on color Doppler imaging. Profunda Femoral Vein: No evidence of thrombus. Normal compressibility and flow on color Doppler imaging. Femoral Vein: No evidence of thrombus. Normal compressibility, respiratory phasicity and response to augmentation. Popliteal Vein: No evidence of thrombus. Normal compressibility, respiratory phasicity and response to augmentation. Calf Veins: No evidence of thrombus. Normal compressibility and flow on color Doppler imaging. Superficial Great Saphenous Vein: No evidence of thrombus. Normal compressibility and flow on color Doppler imaging. Venous Reflux:  None. Other Findings:  None. IMPRESSION: No evidence of deep venous thrombosis. Electronically Signed   By: Maisie Fus  Register   On: 02/07/2016 12:50   Ct Angio Abd/pel W/ And/or W/o  02/08/2016  CLINICAL DATA:  55 year old female with a history of possible systemic venous obstruction EXAM: CT ANGIOGRAPHY AND VENOGRAPHY CHEST, ABDOMEN AND PELVIS TECHNIQUE: Multidetector CT imaging through the chest, abdomen and pelvis was performed using the standard protocol during bolus administration of  intravenous contrast. Multiplanar reconstructed images and MIPs were obtained and reviewed to evaluate the vascular anatomy. CONTRAST:  100 mL Omnipaque 350 COMPARISON:  Prior CT of abdomen and pelvis 06/22/2015; prior CT scan of the chest 02/09/2015 FINDINGS: CTA CHEST FINDINGS Mediastinum: Unremarkable CT appearance of the thyroid gland. No suspicious mediastinal or hilar adenopathy. No soft tissue mediastinal mass. The thoracic esophagus is unremarkable. Heart/Vascular: Pulmonary arterial phase imaging demonstrates excellent opacification of the pulmonary arteries to the proximal subsegmental level. There is no evidence of pulmonary embolus. The main pulmonary artery is enlarged measuring up to 3.6 cm in diameter. This is larger than the adjacent aorta. The heart is also markedly enlarged, particularly the right heart consistent with cor pulmonale. Case ease calcification is present along the mitral valve annulus. Atherosclerotic calcifications present along the left anterior descending, circumflex and right coronary arteries. No pericardial effusion. Lungs/Pleura: Large layering right pleural effusion  with associated right lower lobe atelectasis. No definite pleural enhancement or nodularity. Perhaps mild interlobular septal thickening and ground-glass attenuation airspace opacity in the lungs consistent with mild interstitial edema. Diffuse mild bronchial wall thickening. On the axial images, there is a somewhat nodular opacity in the medial aspect of the left lower lobe measuring 10 x 8 mm. However, on the coronal and sagittal reformatted images this opacity appears more linear and is therefore strongly favored to represent a region of atelectasis. Bones/Soft Tissues: No acute fracture or aggressive appearing lytic or blastic osseous lesion. Review of the MIP images confirms the above findings. CTA ABDOMEN AND PELVIS FINDINGS VASCULAR Aorta: Normal caliber abdominal aorta without evidence of aneurysm. Scattered  atherosclerotic plaque is present throughout. No significant stenosis or evidence of dissection. Celiac: High-grade stenosis versus occlusion of the proximal aspect of the celiac artery secondary to bulky and calcified atherosclerotic plaque. The distal branches reconstitute, possibly through collateralization via the pancreaticoduodenal arcade. SMA: Superior mesenteric artery is widely patent. Prominent pancreaticoduodenal arcade. Renals: The native kidneys are atretic bilaterally. The renal arteries are miniscule and not well seen. IMA: Atretic superior mesenteric artery.  No definite stenosis. Inflow: Calcified atherosclerotic plaque results in focal high-grade stenosis versus short segment occlusion in the left common iliac artery. There is a focal irregular calcified plaque extending into the proximal right common iliac artery resulting in an approximately 50% stenosis. High-grade stenosis of the origin of the left internal iliac artery. The right internal iliac artery is patent. The external iliac arteries are relatively disease free. Proximal Outflow: Diseased bilateral superficial femoral arteries. At least moderate stenosis in the proximal left SFA and mild stenosis in the proximal right SFA. The vessels remain patent into the upper thighs. Veins: Dedicated venous phase imaging demonstrates engorged ment of the suprahepatic IVC. A potentially retrievable IVC filter is present in the infrarenal IVC. This appears to be a [employer]. No evidence of leg penetration, fracture or other complication. No thrombus. The IVC remains patent throughout its course. The iliac and visualized femoral veins are also patent. NON-VASCULAR Abdomen: Abnormal appearance of the liver with enlargement of the left lobe and relative atrophy of the right lobe. Additionally, there is a macronodular contour of the liver surface. Findings raise concern for cirrhotic change versus severe congestive hit the top of the related to  chronic right heart failure. No discrete lesion identified. The gallbladder is surgically absent. No intra or extrahepatic biliary ductal dilatation. Unremarkable CT appearance of the stomach, duodenum, spleen, adrenal glands and pancreas. The native kidneys are atrophic. No evidence of obstruction or focal bowel wall thickening. Normal appendix in the right lower quadrant. The terminal ileum is unremarkable. A peritoneal dialysis catheter is coiled in the pelvic recess. There is small volume ascites which is not unexpected. Pelvis: Surgical changes of prior hysterectomy. Mild pelvic floor laxity. Free ascites related to peritoneal dialysis. Bones/Soft Tissues: No acute fracture or aggressive appearing lytic or blastic osseous lesion. The bones appear mildly mottled diffusely. This appearance is favored to represent demineralization/osteopenia. Renal osteodystrophy is also a possibility. Review of the MIP images confirms the above findings. IMPRESSION: VASCULAR 1. Overall vascular findings are most suggestive of pulmonary arterial hypertension and right-sided heart dysfunction consistent with cor pulmonale. 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 2. No evidence of acute pulmonary embolus, aneurysm, or dissection. 3. Critical stenosis versus short focal occlusion of the origin of the  celiac artery. The distal branch vessels remain patent due in part to collateralization through the pancreaticoduodenal arcade. 4. Atrophic renal arteries bilaterally with associated atrophy of the native kidneys. 5. Peripheral arterial disease with high-grade stenosis versus short segment occlusion of the left common iliac artery, moderate to high-grade stenosis of the proximal right common iliac artery, high-grade stenosis of the origin of the left internal iliac artery, and mild to moderate superficial femoral artery disease  (worse on the left than the right) in the visualized segments of the upper thighs. 6. No evidence of venous thrombosis or obstruction. 7. Well-positioned infrarenal potentially retrievable IVC filter. NON VASCULAR 1. Cardiomegaly with right heart enlargement. 2. Mild interstitial pulmonary edema suggests congestive heart failure. 3. Large layering right pleural effusion with associated right lower lobe atelectasis. 4. Dependent atelectasis in the left lower lobe. 5. 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 Other etiologies for cirrhosis including but not limited to chronic hepatitis, alcoholism are not excluded radiographically. 6. Peritoneal dialysis catheter coiled in the pelvis with associated ascites/dialysate. 7. Mild pelvic floor laxity. 8. Surgical changes of prior hysterectomy and cholecystectomy. 9. Diffusely mottled appearance of the bones favored to represent either demineralization/osteopenia or renal osteodystrophy. 10. Atrophic native kidneys. Signed, Sterling Big, MD [facility] Radiology Electronically Signed   By: Malachy Moan M.D.   On: 02/08/2016 10:25    EKG: Normal sinus rhythm with poor R-wave progression  Weights: Filed Weights   02/06/16 2312 02/07/16 0500 02/08/16 0440  Weight: 120 lb 2.4 oz (54.5 kg) 120 lb 2.4 oz (54.5 kg) 125 lb 12.8 oz (57.063 kg)     Physical Exam: Blood pressure 106/81, pulse 88, temperature 97.8 F (36.6 C), temperature source Oral, resp. rate 20, height 5\' 10"  (1.778 m), weight 125 lb 12.8 oz (57.063 kg), SpO2 99 %. Body mass index is 18.05 kg/(m^2). General: Well developed, well nourished, in no acute distress. Head eyes ears nose throat: Normocephalic, atraumatic, sclera non-icteric, no xanthomas, nares are without discharge. No apparent thyromegaly and/or mass  Lungs: Normal respiratory effort.  no wheezes, basilar rales, no rhonchi.  Heart: RRR with normal S1 S2.  3-4+ apical murmur gallop, no rub, there is an RV heave PMI is enlarged and inferior placement, carotid upstroke normal without bruit, jugular venous pressure is elevated Abdomen: Soft, non-tender, non-distended with normoactive bowel sounds. No hepatomegaly. No rebound/guarding. No obvious abdominal masses. Abdominal aorta is normal size without bruit Extremities: Trace edema. no cyanosis, no clubbing, no ulcers  Peripheral : 2+ bilateral upper extremity pulses, 2+ bilateral femoral pulses, 2+ bilateral dorsal pedal pulse Neuro: Alert and oriented. No facial asymmetry. No focal deficit. Moves all extremities spontaneously. Musculoskeletal: Normal muscle tone without kyphosis Psych:  Responds to questions appropriately with a normal affect.    Assessment: 55 year old female with apparent significant mitral valve prolapse and condition consistent with possible congestive heart failure with melanic stools and severe anemia needing further treatment options  Plan: 1. Continue packed red blood cells and treatment of significant bleeding complications 2. Discontinuation of anticoagulation 3. Echocardiogram for LV systolic dysfunction valvular heart disease likely causing symptoms of the congestion weakness and fatigue and need for further treatment 4. Further consideration of possible watchman for treatment of stroke risk with occlusion device for left atrial appendage 5. Further treatment options after above  Signed, Lamar Blinks M.D. [facility] Cardiology 02/08/2016, 3:45 PM

## 2016-02-08 NOTE — Progress Notes (Signed)
Subjective:  The patient admitted to the hospital for low hemoglobin and has received blood transfusion This morning hemoglobin is up to 8.6 She reports swelling in her right foot She was recently admitted to [facility] for hyperkalemia hypotension, rectal bleeding. Prior to that, she was in the rehabilitation for a stroke. EGD done and [facility] shows grade D esophagitis prominent in the lower and middle 3rd of the esophagus with friable exudates and erosions. Colonoscopy on January 14 showed rectal ulcers She underwent CT angiogram of the abdomen and pelvis. Results are pending. EGD planned for today PD was successfully completely last night. Ultrafiltration of 1000 cc was obtained  Objective:  Vital signs in last 24 hours:  Temp:  [97.4 F (36.3 C)-98.1 F (36.7 C)] 98.1 F (36.7 C) (02/24 0943) Pulse Rate:  [86-92] 86 (02/24 0943) Resp:  [16-24] 24 (02/24 0943) BP: (106-117)/(73-84) 106/73 mmHg (02/24 0943) SpO2:  [98 %-100 %] 100 % (02/24 0943) Weight:  [57.063 kg (125 lb 12.8 oz)] 57.063 kg (125 lb 12.8 oz) (02/24 0440)  Weight change: 12.61 kg (27 lb 12.8 oz) Filed Weights   02/06/16 2312 02/07/16 0500 02/08/16 0440  Weight: 54.5 kg (120 lb 2.4 oz) 54.5 kg (120 lb 2.4 oz) 57.063 kg (125 lb 12.8 oz)    Intake/Output:    Intake/Output Summary (Last 24 hours) at 02/08/16 1018 Last data filed at 02/08/16 0820  Gross per 24 hour  Intake    370 ml  Output   1076 ml  Net   -706 ml     Physical Exam: General: Thin, frail  HEENT Pale conjunctiva, moist oral mucous membranes  Neck supple  Pulm/lungs Lungs are clear to auscultation bilaterally  CVS/Heart No rub or gallop  Abdomen:  Soft, nontender,  Extremities: Right leg edema, no edema on left leg   Neurologic: Right hemiparesis, right arm weakness  Skin: No acute rashes  Access: PD catheter, site nontender       Basic Metabolic Panel:   Recent Labs Lab 02/06/16 1856 02/07/16 0717  NA 133* 134*  K 4.9 5.1  CL 98*  100*  CO2 18* 18*  GLUCOSE 88 103*  BUN 92* 95*  CREATININE 7.97* 8.09*  CALCIUM 8.2* 8.0*     CBC:  Recent Labs Lab 02/06/16 1856 02/07/16 0717 02/07/16 1315 02/07/16 1933 02/08/16 0246  WBC 8.1 9.4  --   --   --   HGB 4.5* 7.0* 7.1* 7.3* 8.6*  HCT 14.1* 21.4*  --   --  25.1*  MCV 91.9 88.6  --   --   --   PLT 194 163  --   --   --       Microbiology:  Recent Results (from the past 720 hour(s))  MRSA PCR Screening     Status: None   Collection Time: 02/06/16 11:22 PM  Result Value Ref Range Status   MRSA by PCR NEGATIVE NEGATIVE Final    Comment:        The GeneXpert MRSA Assay (FDA approved for NASAL specimens only), is one component of a comprehensive MRSA colonization surveillance program. It is not intended to diagnose MRSA infection nor to guide or monitor treatment for MRSA infections.     Coagulation Studies:  Recent Labs  02/06/16 2022  LABPROT 17.0*  INR 1.37    Urinalysis: No results for input(s): COLORURINE, LABSPEC, PHURINE, GLUCOSEU, HGBUR, BILIRUBINUR, KETONESUR, PROTEINUR, UROBILINOGEN, NITRITE, LEUKOCYTESUR in the last 72 hours.  Invalid input(s): APPERANCEUR  Imaging: US Venous Img Lower Unilateral Right  02/07/2016  CLINICAL DATA:  Swelling. EXAM: RIGHT LOWER EXTREMITY VENOUS DOPPLER ULTRASOUND TECHNIQUE: Gray-scale sonography with graded compression, as well as color Doppler and duplex ultrasound were performed to evaluate the lower extremity deep venous systems from the level of the common femoral vein and including the common femoral, femoral, profunda femoral, popliteal and calf veins including the posterior tibial, peroneal and gastrocnemius veins when visible. The superficial great saphenous vein was also interrogated. Spectral Doppler was utilized to evaluate flow at rest and with distal augmentation maneuvers in the common femoral, femoral and popliteal veins. COMPARISON:  No prior. FINDINGS: Contralateral Common Femoral  Vein: Respiratory phasicity is normal and symmetric with the symptomatic side. No evidence of thrombus. Normal compressibility. Common Femoral Vein: No evidence of thrombus. Normal compressibility, respiratory phasicity and response to augmentation. Saphenofemoral Junction: No evidence of thrombus. Normal compressibility and flow on color Doppler imaging. Profunda Femoral Vein: No evidence of thrombus. Normal compressibility and flow on color Doppler imaging. Femoral Vein: No evidence of thrombus. Normal compressibility, respiratory phasicity and response to augmentation. Popliteal Vein: No evidence of thrombus. Normal compressibility, respiratory phasicity and response to augmentation. Calf Veins: No evidence of thrombus. Normal compressibility and flow on color Doppler imaging. Superficial Great Saphenous Vein: No evidence of thrombus. Normal compressibility and flow on color Doppler imaging. Venous Reflux:  None. Other Findings:  None. IMPRESSION: No evidence of deep venous thrombosis. Electronically Signed   By: Maisie Fus  Register   On: 02/07/2016 12:50     Medications:   . pantoprozole (PROTONIX) infusion 8 mg/hr (02/07/16 2121)   . sodium chloride   Intravenous Once  . atorvastatin  80 mg Oral q1800  . dialysis solution 1.5% low-MG/low-CA   Intraperitoneal Q24H  . gentamicin ointment   Topical Daily  . insulin aspart  0-9 Units Subcutaneous 6 times per day  . sodium chloride flush  3 mL Intravenous Q12H   bisacodyl, heparin, ipratropium-albuterol, morphine injection, ondansetron **OR** ondansetron (ZOFRAN) IV, phenol-menthol, sodium phosphate  Assessment/ Plan:  55 y.o. female with history of end-stage renal disease (FSGS) currently on PD, paroxysmal atrial fibrillation, COPD, cirrhosis, stroke with left-sided weakness 2017,severe mitral stenosis,depression, thrombocytopenia, and negative anticardiolipin antibody, weakly positive ANA, acute GI bleed in January 2017 caused by severe esophagitis  and bleeding rectal ulcer, IVC filter  1.  End-stage renal disease - Will continue peritoneal dialysis she is in the hospital  2. Anemia of GI bleed and chronic Kidney Disease - patient received blood transfusion - Will avoid Procrit for now because of recent DVT and stroke - EGD planned for later today  3.  Secondary hyperparathyroidism -  Monitor Phos while in the hospital   ,   LOS: 2 Alfonsa Vaile 2/24/201710:18 AM

## 2016-02-09 ENCOUNTER — Inpatient Hospital Stay
Admit: 2016-02-09 | Discharge: 2016-02-09 | Disposition: A | Discharge status: Home / Self Care | Payer: BC Managed Care – PPO | Attending: Internal Medicine | Admitting: Internal Medicine

## 2016-02-09 LAB — GLUCOSE, CAPILLARY
GLUCOSE-CAPILLARY: 95 mg/dL (ref 65–99)
GLUCOSE-CAPILLARY: 99 mg/dL (ref 65–99)
GLUCOSE-CAPILLARY: 126 mg/dL — AB (ref 65–99)
Glucose-Capillary: 107 mg/dL — ABNORMAL HIGH (ref 65–99)
Glucose-Capillary: 108 mg/dL — ABNORMAL HIGH (ref 65–99)
Glucose-Capillary: 93 mg/dL (ref 65–99)

## 2016-02-09 MED ORDER — PANTOPRAZOLE SODIUM 40 MG PO TBEC
40.0000 mg | DELAYED_RELEASE_TABLET | Freq: Two times a day (BID) | ORAL | Status: DC
  Administered 2016-02-09 – 2016-02-12 (×7): 40 mg via ORAL
  Filled 2016-02-09 (×7): qty 1

## 2016-02-09 NOTE — Evaluation (Signed)
Physical Therapy Evaluation Patient Details Name: Dorothy Long MRN: [id number] DOB: 16-Oct-1961 Today's Date: 02/09/2016   History of Present Illness  55 yo female with onset of recent stroke, was in inpt rehab until 01/24/16, then HHPT.  Now is admitted with GI bleed, dx with cor pulmonale, PAD, has IVC filter, severe anemia 4.5 hgb on admission.    Clinical Impression  Pt was seen for assessing her control of movement and due to her severe R side weakness, would benefit from inpt therapy.  Husband is skeptical for getting approval for rehab again, but will recommend and see.    Follow Up Recommendations CIR    Equipment Recommendations  None recommended by PT    Recommendations for Other Services Rehab consult     Precautions / Restrictions Precautions Precautions: Fall (guard R shoulder due to obvious subluxation) Restrictions Weight Bearing Restrictions: No      Mobility  Bed Mobility Overal bed mobility: Needs Assistance;+2 for physical assistance;+ 2 for safety/equipment Bed Mobility: Supine to Sit;Sit to Supine     Supine to sit: Mod assist;Max assist Sit to supine: Mod assist;Max assist;+2 for physical assistance;+2 for safety/equipment   General bed mobility comments: not able to assist with R side at all  Transfers                 General transfer comment: unable to attempt due to pt fatigue  Ambulation/Gait                Stairs            Wheelchair Mobility    Modified Rankin (Stroke Patients Only) Modified Rankin (Stroke Patients Only) Pre-Morbid Rankin Score: Moderately severe disability Modified Rankin: Severe disability     Balance Overall balance assessment: Needs assistance Sitting-balance support: Feet supported;Bilateral upper extremity supported (pillow under R elbow) Sitting balance-Leahy Scale: Poor                                       Pertinent Vitals/Pain Pain Assessment: No/denies pain     Home Living Family/patient expects to be discharged to:: Private residence Living Arrangements: Spouse/significant other Available Help at Discharge: Family Type of Home: House Home Access: Stairs to enter Entrance Stairs-Rails: None Secretary/administrator of Steps: 4 Home Layout: One level Home Equipment: Cane - single point      Prior Function Level of Independence: Independent with assistive device(s)               Hand Dominance        Extremity/Trunk Assessment                         Communication   Communication: No difficulties  Cognition Arousal/Alertness: Awake/alert Behavior During Therapy: WFL for tasks assessed/performed Overall Cognitive Status: Within Functional Limits for tasks assessed                      General Comments General comments (skin integrity, edema, etc.): Pt was noted to have difficulty with controlling R side, active assisted RLE and PROM to RUE.      Exercises        Assessment/Plan    PT Assessment Patient needs continued PT services  PT Diagnosis Hemiplegia dominant side   PT Problem List Decreased strength;Decreased range of motion;Decreased activity tolerance;Decreased balance;Decreased mobility;Decreased coordination;Decreased knowledge of use of DME;Decreased  cognition;Decreased knowledge of precautions;Decreased safety awareness;Pain  PT Treatment Interventions DME instruction;Gait training;Stair training;Functional mobility training;Therapeutic activities;Therapeutic exercise;Balance training;Cognitive remediation;Neuromuscular re-education;Patient/family education   PT Goals (Current goals can be found in the Care Plan section) Acute Rehab PT Goals Patient Stated Goal: to rest today PT Goal Formulation: With patient/family Time For Goal Achievement: 03/08/16 Potential to Achieve Goals: Good    Frequency Min 2X/week   Barriers to discharge Decreased caregiver support;Inaccessible home  environment      Co-evaluation               End of Session Equipment Utilized During Treatment: Oxygen Activity Tolerance: Patient tolerated treatment well;Patient limited by lethargy;Treatment limited secondary to medical complications (Comment) (R side weakness and low endurance) Patient left: in bed;with call bell/phone within reach;with bed alarm set;with family/visitor present Nurse Communication: Mobility status         Time: 1610-9604 PT Time Calculation (min) (ACUTE ONLY): 32 min   Charges:   PT Evaluation $PT Eval Moderate Complexity: 1 Procedure PT Treatments $Therapeutic Activity: 8-22 mins   PT G Codes:        Ivar Drape 02-25-16, 4:05 PM   Samul Dada, PT MS Acute Rehab Dept. Number: [id number] and [facility] [phone]

## 2016-02-09 NOTE — Progress Notes (Signed)
GI Inpatient Follow-up Note  Patient Identification: Dorothy Long is a 55 y.o. female with GI bleeding likely due to mucosal congestion  Subjective:  Doesn't know color of stool.  Thinks had one stool this am. Denies ab pain, n/v, f/c.  Family in room with multiple questions.   Scheduled Inpatient Medications:  . sodium chloride   Intravenous Once  . atorvastatin  80 mg Oral q1800  . dialysis solution 1.5% low-MG/low-CA   Intraperitoneal Q24H  . gentamicin ointment   Topical Daily  . insulin aspart  0-9 Units Subcutaneous 6 times per day  . pantoprazole  40 mg Oral BID  . sodium chloride flush  3 mL Intravenous Q12H  . sucralfate  1 g Oral 4 times per day    Continuous Inpatient Infusions:     PRN Inpatient Medications:  bisacodyl, heparin, ipratropium-albuterol, morphine injection, ondansetron **OR** ondansetron (ZOFRAN) IV, phenol-menthol  Review of Systems: Constitutional: Weight is stable.  Eyes: No changes in vision. ENT: No oral lesions, sore throat.  GI: see HPI.  Heme/Lymph: + easy bruising.  CV: No chest pain.  GU: No hematuria.  Integumentary: No rashes.  Neuro: No headaches.  Psych: +depression/anxiety.  Endocrine: No heat/cold intolerance.  Allergic/Immunologic: No urticaria.  Resp: No cough, SOB.  Musculoskeletal: No joint swelling.    Physical Examination: BP 111/72 mmHg  Pulse 89  Temp(Src) 98.6 F (37 C) (Oral)  Resp 22  Ht  (1.778 m)  Wt 57.743 kg (127 lb 4.8 oz)  BMI 18.27 kg/m2  SpO2 100% Gen: NAD, alert and oriented x  Neck: supple, + JVP elevate.  Chest: CTA bilaterally, no wheezes, crackles, or other adventitious sounds CV: RRR, no m/g/c/r Abd: soft, NT, ND, +BS in all four quadrants; no HSM, guarding, ridigity, or rebound tenderness Ext: no edema, well perfused with 2+ pulses, Skin: no rash or lesions noted Lymph: no LAD  Data: Lab Results  Component Value Date   WBC 9.4 02/07/2016   HGB 8.1* 02/08/2016   HCT 25.1*  02/08/2016   MCV 88.6 02/07/2016   PLT 163 02/07/2016    Recent Labs Lab 02/07/16 1933 02/08/16 0246 02/08/16 1249  HGB 7.3* 8.6* 8.1*   Lab Results  Component Value Date   NA 134* 02/07/2016   K 5.1 02/07/2016   CL 100* 02/07/2016   CO2 18* 02/07/2016   BUN 95* 02/07/2016   CREATININE 8.09* 02/07/2016   GLU 138 11/15/2014   Lab Results  Component Value Date   ALT 11* 02/06/2016   AST 23 02/06/2016   ALKPHOS 91 02/06/2016   BILITOT 0.8 02/06/2016    Recent Labs Lab 02/06/16 2022  APTT 32  INR 1.37   Assessment/Plan: Ms. Gellner is a 55 y.o. female with GI bleeding likely due to congested edema from CHF.  Hgb stable today. She is unsure if any blood in her stool today.   Recommendations: - follow Hgb - cont PPI - treat CHF if able - no plans for endoscopy   Please call with questions or concerns.  Alfard Cochrane, Addison Naegeli, MD

## 2016-02-09 NOTE — Progress Notes (Signed)
[facility] -  at [facility]   PATIENT NAME: Dorothy Long    MR#:  [id number]  DATE OF BIRTH:  1961/04/24  SUBJECTIVE:   No acute bleeding overnight. Hg. Stable at 8.1 today.  Hungry and wants to eat regular food. Husband at bedside.  Had PD overnight.  No abdominal pain, N/V.    REVIEW OF SYSTEMS:    Review of Systems  Constitutional: Negative for fever and chills.  HENT: Negative for congestion and tinnitus.   Eyes: Negative for blurred vision and double vision.  Respiratory: Negative for cough, shortness of breath and wheezing.   Cardiovascular: Negative for chest pain, orthopnea and PND.  Gastrointestinal: Negative for nausea, vomiting, abdominal pain and diarrhea.  Genitourinary: Negative for dysuria and hematuria.  Neurological: Positive for weakness (generalized). Negative for dizziness, sensory change and focal weakness.  All other systems reviewed and are negative.   Nutrition: Full liquid Tolerating Diet: Yes Tolerating PT: Await Eval.    DRUG ALLERGIES:   Allergies  Allergen Reactions  . Ciprocinonide [Fluocinolone] Hives  . Demerol [Meperidine] Nausea And Vomiting  . Diflucan [Fluconazole] Nausea Only  . Flagyl [Metronidazole] Hives and Itching  . Hydrocodone Itching  . Levaquin [Levofloxacin In D5w] Other (See Comments)    Numbness in legs  . Morphine And Related Nausea And Vomiting  . Tetracyclines & Related Itching and Swelling  . Valium [Diazepam] Nausea Only    hallucinations    VITALS:  Blood pressure 111/72, pulse 89, temperature 98.6 F (37 C), temperature source Oral, resp. rate 22, height  (1.778 m), weight 57.743 kg (127 lb 4.8 oz), SpO2 100 %.  PHYSICAL EXAMINATION:   Physical Exam  GENERAL:  55 y.o.-year-old patient lying in the bed with no acute distress.  EYES: Pupils equal, round, reactive to light and accommodation. No scleral icterus. Extraocular muscles intact.  HEENT: Head atraumatic,  normocephalic. Oropharynx and nasopharynx clear.  NECK:  Supple, no jugular venous distention. No thyroid enlargement, no tenderness.  LUNGS: Normal breath sounds bilaterally, no wheezing, rales, rhonchi. No use of accessory muscles of respiration.  CARDIOVASCULAR: S1, S2 normal. II-III/VI Diastolic murmur at the base, No rubs, or gallops.  ABDOMEN: Soft, nontender, nondistended. Bowel sounds present. No organomegaly or mass. + PD cath in place.   EXTREMITIES: No cyanosis, clubbing, + 1 edema on RUE compared to left.  No LE edema b/l.  NEUROLOGIC: Cranial nerves II through XII are intact. No focal Motor or sensory deficits b/l. Globally weak.   PSYCHIATRIC: The patient is alert and oriented x 3.  SKIN: No obvious rash, lesion, or ulcer.    LABORATORY PANEL:   CBC  Recent Labs Lab 02/07/16 0717  02/08/16 0246 02/08/16 1249  WBC 9.4  --   --   --   HGB 7.0*  < > 8.6* 8.1*  HCT 21.4*  --  25.1*  --   PLT 163  --   --   --   < > = values in this interval not displayed. ------------------------------------------------------------------------------------------------------------------  Chemistries   Recent Labs Lab 02/06/16 1856 02/07/16 0717  NA 133* 134*  K 4.9 5.1  CL 98* 100*  CO2 18* 18*  GLUCOSE 88 103*  BUN 92* 95*  CREATININE 7.97* 8.09*  CALCIUM 8.2* 8.0*  AST 23  --   ALT 11*  --   ALKPHOS 91  --   BILITOT 0.8  --    ------------------------------------------------------------------------------------------------------------------  Cardiac Enzymes  Recent Labs Lab 02/06/16 1856  TROPONINI 0.35*   ------------------------------------------------------------------------------------------------------------------  RADIOLOGY:  Ct Angio Chest Pe W/cm &/or Wo Cm  02/08/2016  CLINICAL DATA:  55 year old female with a history of possible systemic venous obstruction EXAM: CT ANGIOGRAPHY AND VENOGRAPHY CHEST, ABDOMEN AND PELVIS TECHNIQUE: Multidetector CT imaging  through the chest, abdomen and pelvis was performed using the standard protocol during bolus administration of intravenous contrast. Multiplanar reconstructed images and MIPs were obtained and reviewed to evaluate the vascular anatomy. CONTRAST:  100 mL Omnipaque 350 COMPARISON:  Prior CT of abdomen and pelvis 06/22/2015; prior CT scan of the chest 02/09/2015 FINDINGS: CTA CHEST FINDINGS Mediastinum: Unremarkable CT appearance of the thyroid gland. No suspicious mediastinal or hilar adenopathy. No soft tissue mediastinal mass. The thoracic esophagus is unremarkable. Heart/Vascular: Pulmonary arterial phase imaging demonstrates excellent opacification of the pulmonary arteries to the proximal subsegmental level. There is no evidence of pulmonary embolus. The main pulmonary artery is enlarged measuring up to 3.6 cm in diameter. This is larger than the adjacent aorta. The heart is also markedly enlarged, particularly the right heart consistent with cor pulmonale. Case ease calcification is present along the mitral valve annulus. Atherosclerotic calcifications present along the left anterior descending, circumflex and right coronary arteries. No pericardial effusion. Lungs/Pleura: Large layering right pleural effusion with associated right lower lobe atelectasis. No definite pleural enhancement or nodularity. Perhaps mild interlobular septal thickening and ground-glass attenuation airspace opacity in the lungs consistent with mild interstitial edema. Diffuse mild bronchial wall thickening. On the axial images, there is a somewhat nodular opacity in the medial aspect of the left lower lobe measuring 10 x 8 mm. However, on the coronal and sagittal reformatted images this opacity appears more linear and is therefore strongly favored to represent a region of atelectasis. Bones/Soft Tissues: No acute fracture or aggressive appearing lytic or blastic osseous lesion. Review of the MIP images confirms the above findings. CTA  ABDOMEN AND PELVIS FINDINGS VASCULAR Aorta: Normal caliber abdominal aorta without evidence of aneurysm. Scattered atherosclerotic plaque is present throughout. No significant stenosis or evidence of dissection. Celiac: High-grade stenosis versus occlusion of the proximal aspect of the celiac artery secondary to bulky and calcified atherosclerotic plaque. The distal branches reconstitute, possibly through collateralization via the pancreaticoduodenal arcade. SMA: Superior mesenteric artery is widely patent. Prominent pancreaticoduodenal arcade. Renals: The native kidneys are atretic bilaterally. The renal arteries are miniscule and not well seen. IMA: Atretic superior mesenteric artery.  No definite stenosis. Inflow: Calcified atherosclerotic plaque results in focal high-grade stenosis versus short segment occlusion in the left common iliac artery. There is a focal irregular calcified plaque extending into the proximal right common iliac artery resulting in an approximately 50% stenosis. High-grade stenosis of the origin of the left internal iliac artery. The right internal iliac artery is patent. The external iliac arteries are relatively disease free. Proximal Outflow: Diseased bilateral superficial femoral arteries. At least moderate stenosis in the proximal left SFA and mild stenosis in the proximal right SFA. The vessels remain patent into the upper thighs. Veins: Dedicated venous phase imaging demonstrates engorged ment of the suprahepatic IVC. A potentially retrievable IVC filter is present in the infrarenal IVC. This appears to be a [employer]. No evidence of leg penetration, fracture or other complication. No thrombus. The IVC remains patent throughout its course. The iliac and visualized femoral veins are also patent. NON-VASCULAR Abdomen: Abnormal appearance of the liver with enlargement of the left lobe and relative atrophy of the right lobe. Additionally, there is a macronodular contour  of the  liver surface. Findings raise concern for cirrhotic change versus severe congestive hit the top of the related to chronic right heart failure. No discrete lesion identified. The gallbladder is surgically absent. No intra or extrahepatic biliary ductal dilatation. Unremarkable CT appearance of the stomach, duodenum, spleen, adrenal glands and pancreas. The native kidneys are atrophic. No evidence of obstruction or focal bowel wall thickening. Normal appendix in the right lower quadrant. The terminal ileum is unremarkable. A peritoneal dialysis catheter is coiled in the pelvic recess. There is small volume ascites which is not unexpected. Pelvis: Surgical changes of prior hysterectomy. Mild pelvic floor laxity. Free ascites related to peritoneal dialysis. Bones/Soft Tissues: No acute fracture or aggressive appearing lytic or blastic osseous lesion. The bones appear mildly mottled diffusely. This appearance is favored to represent demineralization/osteopenia. Renal osteodystrophy is also a possibility. Review of the MIP images confirms the above findings. IMPRESSION: VASCULAR 1. Overall vascular findings are most suggestive of pulmonary arterial hypertension and right-sided heart dysfunction consistent with cor pulmonale. 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 2. No evidence of acute pulmonary embolus, aneurysm, or dissection. 3. Critical stenosis versus short focal occlusion of the origin of the celiac artery. The distal branch vessels remain patent due in part to collateralization through the pancreaticoduodenal arcade. 4. Atrophic renal arteries bilaterally with associated atrophy of the native kidneys. 5. Peripheral arterial disease with high-grade stenosis versus short segment occlusion of the left common iliac artery, moderate to high-grade stenosis of the proximal right common iliac artery, high-grade  stenosis of the origin of the left internal iliac artery, and mild to moderate superficial femoral artery disease (worse on the left than the right) in the visualized segments of the upper thighs. 6. No evidence of venous thrombosis or obstruction. 7. Well-positioned infrarenal potentially retrievable IVC filter. NON VASCULAR 1. Cardiomegaly with right heart enlargement. 2. Mild interstitial pulmonary edema suggests congestive heart failure. 3. Large layering right pleural effusion with associated right lower lobe atelectasis. 4. Dependent atelectasis in the left lower lobe. 5. 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 Other etiologies for cirrhosis including but not limited to chronic hepatitis, alcoholism are not excluded radiographically. 6. Peritoneal dialysis catheter coiled in the pelvis with associated ascites/dialysate. 7. Mild pelvic floor laxity. 8. Surgical changes of prior hysterectomy and cholecystectomy. 9. Diffusely mottled appearance of the bones favored to represent either demineralization/osteopenia or renal osteodystrophy. 10. Atrophic native kidneys. Signed, Sterling Big, MD [facility] Electronically Signed   By: Malachy Moan M.D.   On: 02/08/2016 10:25   Ct Angio Abd/pel W/ And/or W/o  02/08/2016  CLINICAL DATA:  55 year old female with a history of possible systemic venous obstruction EXAM: CT ANGIOGRAPHY AND VENOGRAPHY CHEST, ABDOMEN AND PELVIS TECHNIQUE: Multidetector CT imaging through the chest, abdomen and pelvis was performed using the standard protocol during bolus administration of intravenous contrast. Multiplanar reconstructed images and MIPs were obtained and reviewed to evaluate the vascular anatomy. CONTRAST:  100 mL Omnipaque 350 COMPARISON:  Prior CT of abdomen and pelvis 06/22/2015; prior CT scan of the chest 02/09/2015 FINDINGS: CTA CHEST FINDINGS Mediastinum: Unremarkable CT appearance  of the thyroid gland. No suspicious mediastinal or hilar adenopathy. No soft tissue mediastinal mass. The thoracic esophagus is unremarkable. Heart/Vascular: Pulmonary arterial phase imaging demonstrates excellent opacification of the pulmonary arteries to the proximal subsegmental level. There is no evidence of pulmonary embolus.  The main pulmonary artery is enlarged measuring up to 3.6 cm in diameter. This is larger than the adjacent aorta. The heart is also markedly enlarged, particularly the right heart consistent with cor pulmonale. Case ease calcification is present along the mitral valve annulus. Atherosclerotic calcifications present along the left anterior descending, circumflex and right coronary arteries. No pericardial effusion. Lungs/Pleura: Large layering right pleural effusion with associated right lower lobe atelectasis. No definite pleural enhancement or nodularity. Perhaps mild interlobular septal thickening and ground-glass attenuation airspace opacity in the lungs consistent with mild interstitial edema. Diffuse mild bronchial wall thickening. On the axial images, there is a somewhat nodular opacity in the medial aspect of the left lower lobe measuring 10 x 8 mm. However, on the coronal and sagittal reformatted images this opacity appears more linear and is therefore strongly favored to represent a region of atelectasis. Bones/Soft Tissues: No acute fracture or aggressive appearing lytic or blastic osseous lesion. Review of the MIP images confirms the above findings. CTA ABDOMEN AND PELVIS FINDINGS VASCULAR Aorta: Normal caliber abdominal aorta without evidence of aneurysm. Scattered atherosclerotic plaque is present throughout. No significant stenosis or evidence of dissection. Celiac: High-grade stenosis versus occlusion of the proximal aspect of the celiac artery secondary to bulky and calcified atherosclerotic plaque. The distal branches reconstitute, possibly through collateralization via  the pancreaticoduodenal arcade. SMA: Superior mesenteric artery is widely patent. Prominent pancreaticoduodenal arcade. Renals: The native kidneys are atretic bilaterally. The renal arteries are miniscule and not well seen. IMA: Atretic superior mesenteric artery.  No definite stenosis. Inflow: Calcified atherosclerotic plaque results in focal high-grade stenosis versus short segment occlusion in the left common iliac artery. There is a focal irregular calcified plaque extending into the proximal right common iliac artery resulting in an approximately 50% stenosis. High-grade stenosis of the origin of the left internal iliac artery. The right internal iliac artery is patent. The external iliac arteries are relatively disease free. Proximal Outflow: Diseased bilateral superficial femoral arteries. At least moderate stenosis in the proximal left SFA and mild stenosis in the proximal right SFA. The vessels remain patent into the upper thighs. Veins: Dedicated venous phase imaging demonstrates engorged ment of the suprahepatic IVC. A potentially retrievable IVC filter is present in the infrarenal IVC. This appears to be a [employer]. No evidence of leg penetration, fracture or other complication. No thrombus. The IVC remains patent throughout its course. The iliac and visualized femoral veins are also patent. NON-VASCULAR Abdomen: Abnormal appearance of the liver with enlargement of the left lobe and relative atrophy of the right lobe. Additionally, there is a macronodular contour of the liver surface. Findings raise concern for cirrhotic change versus severe congestive hit the top of the related to chronic right heart failure. No discrete lesion identified. The gallbladder is surgically absent. No intra or extrahepatic biliary ductal dilatation. Unremarkable CT appearance of the stomach, duodenum, spleen, adrenal glands and pancreas. The native kidneys are atrophic. No evidence of obstruction or focal bowel  wall thickening. Normal appendix in the right lower quadrant. The terminal ileum is unremarkable. A peritoneal dialysis catheter is coiled in the pelvic recess. There is small volume ascites which is not unexpected. Pelvis: Surgical changes of prior hysterectomy. Mild pelvic floor laxity. Free ascites related to peritoneal dialysis. Bones/Soft Tissues: No acute fracture or aggressive appearing lytic or blastic osseous lesion. The bones appear mildly mottled diffusely. This appearance is favored to represent demineralization/osteopenia. Renal osteodystrophy is also a possibility. Review of the MIP images confirms  the above findings. IMPRESSION: VASCULAR 1. Overall vascular findings are most suggestive of pulmonary arterial hypertension and right-sided heart dysfunction consistent with cor pulmonale. 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 2. No evidence of acute pulmonary embolus, aneurysm, or dissection. 3. Critical stenosis versus short focal occlusion of the origin of the celiac artery. The distal branch vessels remain patent due in part to collateralization through the pancreaticoduodenal arcade. 4. Atrophic renal arteries bilaterally with associated atrophy of the native kidneys. 5. Peripheral arterial disease with high-grade stenosis versus short segment occlusion of the left common iliac artery, moderate to high-grade stenosis of the proximal right common iliac artery, high-grade stenosis of the origin of the left internal iliac artery, and mild to moderate superficial femoral artery disease (worse on the left than the right) in the visualized segments of the upper thighs. 6. No evidence of venous thrombosis or obstruction. 7. Well-positioned infrarenal potentially retrievable IVC filter. NON VASCULAR 1. Cardiomegaly with right heart enlargement. 2. Mild interstitial pulmonary edema suggests congestive  heart failure. 3. Large layering right pleural effusion with associated right lower lobe atelectasis. 4. Dependent atelectasis in the left lower lobe. 5. 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 Other etiologies for cirrhosis including but not limited to chronic hepatitis, alcoholism are not excluded radiographically. 6. Peritoneal dialysis catheter coiled in the pelvis with associated ascites/dialysate. 7. Mild pelvic floor laxity. 8. Surgical changes of prior hysterectomy and cholecystectomy. 9. Diffusely mottled appearance of the bones favored to represent either demineralization/osteopenia or renal osteodystrophy. 10. Atrophic native kidneys. Signed, Sterling Big, MD [facility] Electronically Signed   By: Malachy Moan M.D.   On: 02/08/2016 10:25     ASSESSMENT AND PLAN:   55 year old female with past medical history of end-stage renal disease on peritoneal dialysis, history of previous CVA, diabetes, COPD, anemia of chronic disease, paroxysmal atrial Fibrillation, chronic systolic and diastolic CHF, who presented to the hospital to 2 weakness, altered mental status and noted to be anemic.  #1 acute posthemorrhagic anemia-this was secondary to GI bleed. Patient has been transfused a total of 2 units of packed red blood cells since being admitted to the hospital and hemoglobin currently stable to 8.1. -We'll follow serial hemoglobin.  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 -Patient was seen by gastroenterology and they have no plans for doing any acute endoscopic intervention presently. Her hemoglobin is currently stable. She has no abdominal pain, nausea vomiting or hematemesis. -She is tolerating a full liquid diet well and advanced to a regular diet today. Change Protonix drip  to oral Protonix twice daily.  #3 end-stage renal disease on hemodialysis- pt. Is on PD and cont. Care as per Nephrology.  - pt. Is getting PD at night as per Nephro.   #4 GERD - cont. Protonix.   #5 diabetes type 2 without complication-continue sliding scale insulin for now.  #6 history of recent CVA-aspirin is on hold due to GI bleed. Continue Lipitor.  #7 COPD-no acute exacerbation-continue duo nebs as needed.  We will get a physical therapy evaluation today.   All the records are reviewed and case discussed with Care Management/Social Workerr. Management plans discussed with the patient, family and they are in agreement.  CODE STATUS: DO NOT RESUSCITATE  DVT Prophylaxis: Teds and SCDs  TOTAL TIME TAKING CARE OF THIS PATIENT: 30 minutes.   POSSIBLE D/C IN 1-2 DAYS, DEPENDING ON CLINICAL CONDITION.   Houston Siren M.D on 02/09/2016 at 1:18 PM  Between 7am to 6pm - Pager - [phone]  After 6pm go to [email or web address] - password [email or web address] [facility]  [place] [place] [facility]  Office  [phone]  CC: Primary care physician; Sherlene Shams, MD

## 2016-02-09 NOTE — Progress Notes (Signed)
Pre-Peritoneal dialysis 

## 2016-02-09 NOTE — Plan of Care (Signed)
Problem: Bowel/Gastric: Goal: Will show no signs and symptoms of gastrointestinal bleeding Outcome: Progressing Hgb 8.1.  No signs/symptoms of bleeding noted.

## 2016-02-09 NOTE — Progress Notes (Signed)
[facility] Cardiology [facility] Encounter Note  Patient: Dorothy Long / Admit Date: 02/06/2016 / Date of Encounter: 02/09/2016, 8:46 AM   Subjective: Patient is still very weak with some shortness of breath and unable to do any ambulation. There is no evidence of overt heart failure symptoms. No evidence of dominant bleeding  Review of Systems: Positive for: Weakness fatigue Negative for: Vision change, hearing change, syncope, dizziness, nausea, vomiting,diarrhea,  stomach pain, cough, congestion, diaphoresis, urinary frequency, urinary pain,skin lesions, skin rashes Others previously listed  Objective: Telemetry: Normal sinus rhythm Physical Exam: Blood pressure 114/77, pulse 81, temperature 98.1 F (36.7 C), temperature source Oral, resp. rate 18, height 5\' 10"  (1.778 m), weight 127 lb 4.8 oz (57.743 kg), SpO2 97 %. Body mass index is 18.27 kg/(m^2). General: Well developed, well nourished, in no acute distress. Head: Normocephalic, atraumatic, sclera non-icteric, no xanthomas, nares are without discharge. Neck: No apparent masses Lungs: Normal respirations with no wheezes, no rhonchi, no rales , no crackles   Heart: Regular rate and rhythm, normal S1 S2, 4+ apical murmur, no rub, prominent gallop and RV heave PMI is normal size and inferior placement, carotid upstroke normal without bruit, jugular venous pressure normal Abdomen: Soft, non-tender, non-distended with normoactive bowel sounds. No hepatosplenomegaly. Abdominal aorta is normal size without bruit Extremities: Trace edema, no clubbing, no cyanosis, no ulcers,  Peripheral: 2+ radial, 2+ femoral, 2+ dorsal pedal pulses Neuro: Alert and oriented. Moves all extremities spontaneously. Psych:  Responds to questions appropriately with a normal affect.   Intake/Output Summary (Last 24 hours) at 02/09/16 0846 Last data filed at 02/08/16 1800  Gross per 24 hour  Intake    720 ml  Output      0 ml  Net    720 ml     Inpatient Medications:  . sodium chloride   Intravenous Once  . atorvastatin  80 mg Oral q1800  . dialysis solution 1.5% low-MG/low-CA   Intraperitoneal Q24H  . gentamicin ointment   Topical Daily  . insulin aspart  0-9 Units Subcutaneous 6 times per day  . sodium chloride flush  3 mL Intravenous Q12H  . sucralfate  1 g Oral 4 times per day   Infusions:  . pantoprozole (PROTONIX) infusion 8 mg/hr (02/09/16 0439)    Labs:  Recent Labs  02/06/16 1856 02/07/16 0717  NA 133* 134*  K 4.9 5.1  CL 98* 100*  CO2 18* 18*  GLUCOSE 88 103*  BUN 92* 95*  CREATININE 7.97* 8.09*  CALCIUM 8.2* 8.0*    Recent Labs  02/06/16 1856  AST 23  ALT 11*  ALKPHOS 91  BILITOT 0.8  PROT 5.1*  ALBUMIN 2.2*    Recent Labs  02/06/16 1856 02/07/16 0717  02/08/16 0246 02/08/16 1249  WBC 8.1 9.4  --   --   --   HGB 4.5* 7.0*  < > 8.6* 8.1*  HCT 14.1* 21.4*  --  25.1*  --   MCV 91.9 88.6  --   --   --   PLT 194 163  --   --   --   < > = values in this interval not displayed.  Recent Labs  02/06/16 1856  TROPONINI 0.35*   Invalid input(s): POCBNP No results for input(s): HGBA1C in the last 72 hours.   Weights: Filed Weights   02/07/16 0500 02/08/16 0440 02/09/16 0411  Weight: 120 lb 2.4 oz (54.5 kg) 125 lb 12.8 oz (57.063 kg) 127 lb 4.8 oz (57.743  kg)     Radiology/Studies:  Ct Angio Chest Pe W/cm &/or Wo Cm  02/08/2016  CLINICAL DATA:  55 year old female with a history of possible systemic venous obstruction EXAM: CT ANGIOGRAPHY AND VENOGRAPHY CHEST, ABDOMEN AND PELVIS TECHNIQUE: Multidetector CT imaging through the chest, abdomen and pelvis was performed using the standard protocol during bolus administration of intravenous contrast. Multiplanar reconstructed images and MIPs were obtained and reviewed to evaluate the vascular anatomy. CONTRAST:  100 mL Omnipaque 350 COMPARISON:  Prior CT of abdomen and pelvis 06/22/2015; prior CT scan of the chest 02/09/2015 FINDINGS: CTA  CHEST FINDINGS Mediastinum: Unremarkable CT appearance of the thyroid gland. No suspicious mediastinal or hilar adenopathy. No soft tissue mediastinal mass. The thoracic esophagus is unremarkable. Heart/Vascular: Pulmonary arterial phase imaging demonstrates excellent opacification of the pulmonary arteries to the proximal subsegmental level. There is no evidence of pulmonary embolus. The main pulmonary artery is enlarged measuring up to 3.6 cm in diameter. This is larger than the adjacent aorta. The heart is also markedly enlarged, particularly the right heart consistent with cor pulmonale. Case ease calcification is present along the mitral valve annulus. Atherosclerotic calcifications present along the left anterior descending, circumflex and right coronary arteries. No pericardial effusion. Lungs/Pleura: Large layering right pleural effusion with associated right lower lobe atelectasis. No definite pleural enhancement or nodularity. Perhaps mild interlobular septal thickening and ground-glass attenuation airspace opacity in the lungs consistent with mild interstitial edema. Diffuse mild bronchial wall thickening. On the axial images, there is a somewhat nodular opacity in the medial aspect of the left lower lobe measuring 10 x 8 mm. However, on the coronal and sagittal reformatted images this opacity appears more linear and is therefore strongly favored to represent a region of atelectasis. Bones/Soft Tissues: No acute fracture or aggressive appearing lytic or blastic osseous lesion. Review of the MIP images confirms the above findings. CTA ABDOMEN AND PELVIS FINDINGS VASCULAR Aorta: Normal caliber abdominal aorta without evidence of aneurysm. Scattered atherosclerotic plaque is present throughout. No significant stenosis or evidence of dissection. Celiac: High-grade stenosis versus occlusion of the proximal aspect of the celiac artery secondary to bulky and calcified atherosclerotic plaque. The distal branches  reconstitute, possibly through collateralization via the pancreaticoduodenal arcade. SMA: Superior mesenteric artery is widely patent. Prominent pancreaticoduodenal arcade. Renals: The native kidneys are atretic bilaterally. The renal arteries are miniscule and not well seen. IMA: Atretic superior mesenteric artery.  No definite stenosis. Inflow: Calcified atherosclerotic plaque results in focal high-grade stenosis versus short segment occlusion in the left common iliac artery. There is a focal irregular calcified plaque extending into the proximal right common iliac artery resulting in an approximately 50% stenosis. High-grade stenosis of the origin of the left internal iliac artery. The right internal iliac artery is patent. The external iliac arteries are relatively disease free. Proximal Outflow: Diseased bilateral superficial femoral arteries. At least moderate stenosis in the proximal left SFA and mild stenosis in the proximal right SFA. The vessels remain patent into the upper thighs. Veins: Dedicated venous phase imaging demonstrates engorged ment of the suprahepatic IVC. A potentially retrievable IVC filter is present in the infrarenal IVC. This appears to be a [employer]. No evidence of leg penetration, fracture or other complication. No thrombus. The IVC remains patent throughout its course. The iliac and visualized femoral veins are also patent. NON-VASCULAR Abdomen: Abnormal appearance of the liver with enlargement of the left lobe and relative atrophy of the right lobe. Additionally, there is a macronodular contour of  the liver surface. Findings raise concern for cirrhotic change versus severe congestive hit the top of the related to chronic right heart failure. No discrete lesion identified. The gallbladder is surgically absent. No intra or extrahepatic biliary ductal dilatation. Unremarkable CT appearance of the stomach, duodenum, spleen, adrenal glands and pancreas. The native kidneys are  atrophic. No evidence of obstruction or focal bowel wall thickening. Normal appendix in the right lower quadrant. The terminal ileum is unremarkable. A peritoneal dialysis catheter is coiled in the pelvic recess. There is small volume ascites which is not unexpected. Pelvis: Surgical changes of prior hysterectomy. Mild pelvic floor laxity. Free ascites related to peritoneal dialysis. Bones/Soft Tissues: No acute fracture or aggressive appearing lytic or blastic osseous lesion. The bones appear mildly mottled diffusely. This appearance is favored to represent demineralization/osteopenia. Renal osteodystrophy is also a possibility. Review of the MIP images confirms the above findings. IMPRESSION: VASCULAR 1. Overall vascular findings are most suggestive of pulmonary arterial hypertension and right-sided heart dysfunction consistent with cor pulmonale. 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 2. No evidence of acute pulmonary embolus, aneurysm, or dissection. 3. Critical stenosis versus short focal occlusion of the origin of the celiac artery. The distal branch vessels remain patent due in part to collateralization through the pancreaticoduodenal arcade. 4. Atrophic renal arteries bilaterally with associated atrophy of the native kidneys. 5. Peripheral arterial disease with high-grade stenosis versus short segment occlusion of the left common iliac artery, moderate to high-grade stenosis of the proximal right common iliac artery, high-grade stenosis of the origin of the left internal iliac artery, and mild to moderate superficial femoral artery disease (worse on the left than the right) in the visualized segments of the upper thighs. 6. No evidence of venous thrombosis or obstruction. 7. Well-positioned infrarenal potentially retrievable IVC filter. NON VASCULAR 1. Cardiomegaly with right heart enlargement. 2.  Mild interstitial pulmonary edema suggests congestive heart failure. 3. Large layering right pleural effusion with associated right lower lobe atelectasis. 4. Dependent atelectasis in the left lower lobe. 5. 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 Other etiologies for cirrhosis including but not limited to chronic hepatitis, alcoholism are not excluded radiographically. 6. Peritoneal dialysis catheter coiled in the pelvis with associated ascites/dialysate. 7. Mild pelvic floor laxity. 8. Surgical changes of prior hysterectomy and cholecystectomy. 9. Diffusely mottled appearance of the bones favored to represent either demineralization/osteopenia or renal osteodystrophy. 10. Atrophic native kidneys. Signed, Sterling Big, MD [facility] Radiology Electronically Signed   By: Malachy Moan M.D.   On: 02/08/2016 10:25   US Venous Img Lower Unilateral Right  02/07/2016  CLINICAL DATA:  Swelling. EXAM: RIGHT LOWER EXTREMITY VENOUS DOPPLER ULTRASOUND TECHNIQUE: Gray-scale sonography with graded compression, as well as color Doppler and duplex ultrasound were performed to evaluate the lower extremity deep venous systems from the level of the common femoral vein and including the common femoral, femoral, profunda femoral, popliteal and calf veins including the posterior tibial, peroneal and gastrocnemius veins when visible. The superficial great saphenous vein was also interrogated. Spectral Doppler was utilized to evaluate flow at rest and with distal augmentation maneuvers in the common femoral, femoral and popliteal veins. COMPARISON:  No prior. FINDINGS: Contralateral Common Femoral Vein: Respiratory phasicity is normal and symmetric with the symptomatic side. No evidence of thrombus. Normal compressibility. Common Femoral Vein: No evidence of thrombus. Normal compressibility, respiratory phasicity and response to  augmentation.  Saphenofemoral Junction: No evidence of thrombus. Normal compressibility and flow on color Doppler imaging. Profunda Femoral Vein: No evidence of thrombus. Normal compressibility and flow on color Doppler imaging. Femoral Vein: No evidence of thrombus. Normal compressibility, respiratory phasicity and response to augmentation. Popliteal Vein: No evidence of thrombus. Normal compressibility, respiratory phasicity and response to augmentation. Calf Veins: No evidence of thrombus. Normal compressibility and flow on color Doppler imaging. Superficial Great Saphenous Vein: No evidence of thrombus. Normal compressibility and flow on color Doppler imaging. Venous Reflux:  None. Other Findings:  None. IMPRESSION: No evidence of deep venous thrombosis. Electronically Signed   By: Maisie Fus  Register   On: 02/07/2016 12:50   Ct Angio Abd/pel W/ And/or W/o  02/08/2016  CLINICAL DATA:  55 year old female with a history of possible systemic venous obstruction EXAM: CT ANGIOGRAPHY AND VENOGRAPHY CHEST, ABDOMEN AND PELVIS TECHNIQUE: Multidetector CT imaging through the chest, abdomen and pelvis was performed using the standard protocol during bolus administration of intravenous contrast. Multiplanar reconstructed images and MIPs were obtained and reviewed to evaluate the vascular anatomy. CONTRAST:  100 mL Omnipaque 350 COMPARISON:  Prior CT of abdomen and pelvis 06/22/2015; prior CT scan of the chest 02/09/2015 FINDINGS: CTA CHEST FINDINGS Mediastinum: Unremarkable CT appearance of the thyroid gland. No suspicious mediastinal or hilar adenopathy. No soft tissue mediastinal mass. The thoracic esophagus is unremarkable. Heart/Vascular: Pulmonary arterial phase imaging demonstrates excellent opacification of the pulmonary arteries to the proximal subsegmental level. There is no evidence of pulmonary embolus. The main pulmonary artery is enlarged measuring up to 3.6 cm in diameter. This is larger than the adjacent aorta. The heart is  also markedly enlarged, particularly the right heart consistent with cor pulmonale. Case ease calcification is present along the mitral valve annulus. Atherosclerotic calcifications present along the left anterior descending, circumflex and right coronary arteries. No pericardial effusion. Lungs/Pleura: Large layering right pleural effusion with associated right lower lobe atelectasis. No definite pleural enhancement or nodularity. Perhaps mild interlobular septal thickening and ground-glass attenuation airspace opacity in the lungs consistent with mild interstitial edema. Diffuse mild bronchial wall thickening. On the axial images, there is a somewhat nodular opacity in the medial aspect of the left lower lobe measuring 10 x 8 mm. However, on the coronal and sagittal reformatted images this opacity appears more linear and is therefore strongly favored to represent a region of atelectasis. Bones/Soft Tissues: No acute fracture or aggressive appearing lytic or blastic osseous lesion. Review of the MIP images confirms the above findings. CTA ABDOMEN AND PELVIS FINDINGS VASCULAR Aorta: Normal caliber abdominal aorta without evidence of aneurysm. Scattered atherosclerotic plaque is present throughout. No significant stenosis or evidence of dissection. Celiac: High-grade stenosis versus occlusion of the proximal aspect of the celiac artery secondary to bulky and calcified atherosclerotic plaque. The distal branches reconstitute, possibly through collateralization via the pancreaticoduodenal arcade. SMA: Superior mesenteric artery is widely patent. Prominent pancreaticoduodenal arcade. Renals: The native kidneys are atretic bilaterally. The renal arteries are miniscule and not well seen. IMA: Atretic superior mesenteric artery.  No definite stenosis. Inflow: Calcified atherosclerotic plaque results in focal high-grade stenosis versus short segment occlusion in the left common iliac artery. There is a focal irregular  calcified plaque extending into the proximal right common iliac artery resulting in an approximately 50% stenosis. High-grade stenosis of the origin of the left internal iliac artery. The right internal iliac artery is patent. The external iliac arteries are relatively disease free. Proximal Outflow: Diseased bilateral superficial femoral arteries.  At least moderate stenosis in the proximal left SFA and mild stenosis in the proximal right SFA. The vessels remain patent into the upper thighs. Veins: Dedicated venous phase imaging demonstrates engorged ment of the suprahepatic IVC. A potentially retrievable IVC filter is present in the infrarenal IVC. This appears to be a [employer]. No evidence of leg penetration, fracture or other complication. No thrombus. The IVC remains patent throughout its course. The iliac and visualized femoral veins are also patent. NON-VASCULAR Abdomen: Abnormal appearance of the liver with enlargement of the left lobe and relative atrophy of the right lobe. Additionally, there is a macronodular contour of the liver surface. Findings raise concern for cirrhotic change versus severe congestive hit the top of the related to chronic right heart failure. No discrete lesion identified. The gallbladder is surgically absent. No intra or extrahepatic biliary ductal dilatation. Unremarkable CT appearance of the stomach, duodenum, spleen, adrenal glands and pancreas. The native kidneys are atrophic. No evidence of obstruction or focal bowel wall thickening. Normal appendix in the right lower quadrant. The terminal ileum is unremarkable. A peritoneal dialysis catheter is coiled in the pelvic recess. There is small volume ascites which is not unexpected. Pelvis: Surgical changes of prior hysterectomy. Mild pelvic floor laxity. Free ascites related to peritoneal dialysis. Bones/Soft Tissues: No acute fracture or aggressive appearing lytic or blastic osseous lesion. The bones appear mildly  mottled diffusely. This appearance is favored to represent demineralization/osteopenia. Renal osteodystrophy is also a possibility. Review of the MIP images confirms the above findings. IMPRESSION: VASCULAR 1. Overall vascular findings are most suggestive of pulmonary arterial hypertension and right-sided heart dysfunction consistent with cor pulmonale. 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 2. No evidence of acute pulmonary embolus, aneurysm, or dissection. 3. Critical stenosis versus short focal occlusion of the origin of the celiac artery. The distal branch vessels remain patent due in part to collateralization through the pancreaticoduodenal arcade. 4. Atrophic renal arteries bilaterally with associated atrophy of the native kidneys. 5. Peripheral arterial disease with high-grade stenosis versus short segment occlusion of the left common iliac artery, moderate to high-grade stenosis of the proximal right common iliac artery, high-grade stenosis of the origin of the left internal iliac artery, and mild to moderate superficial femoral artery disease (worse on the left than the right) in the visualized segments of the upper thighs. 6. No evidence of venous thrombosis or obstruction. 7. Well-positioned infrarenal potentially retrievable IVC filter. NON VASCULAR 1. Cardiomegaly with right heart enlargement. 2. Mild interstitial pulmonary edema suggests congestive heart failure. 3. Large layering right pleural effusion with associated right lower lobe atelectasis. 4. Dependent atelectasis in the left lower lobe. 5. 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 Other etiologies for cirrhosis including but not limited to chronic hepatitis, alcoholism are not excluded radiographically. 6. Peritoneal dialysis catheter coiled in the pelvis with associated ascites/dialysate. 7. Mild pelvic floor  laxity. 8. Surgical changes of prior hysterectomy and cholecystectomy. 9. Diffusely mottled appearance of the bones favored to represent either demineralization/osteopenia or renal osteodystrophy. 10. Atrophic native kidneys. Signed, Sterling Big, MD [facility] Radiology Electronically Signed   By: Malachy Moan M.D.   On: 02/08/2016 10:25     Assessment and Recommendation  55 y.o. female with chronic kidney disease stage V and previous vascular disease with stroke having significant side effects of this illness and possible pulmonary hypertension from  severe mitral regurgitation from mitral valve prolapse 1. Continue dialysis for improvements of pulmonary edema and pulmonary hypertension if able 2. No aspirin or other anticoagulants due to recent bleeding 3. Further investigation of extent of LV function and valvular heart disease with pulmonary hypertension by echocardiogram for further adjustments of medication and treatment options 4. High intensity cholesterol therapy with atorvastatin  Signed, Arnoldo Hooker M.D. FACC

## 2016-02-09 NOTE — Progress Notes (Signed)
Subjective:  The patient admitted to the hospital for low hemoglobin and has received blood transfusion This morning hemoglobin is 8.1   She was recently admitted to [facility] for hyperkalemia hypotension, rectal bleeding, left leg DVT - ivc filter placed. Prior to that, she was in the rehabilitation for a stroke. EGD done and [facility] shows grade D esophagitis prominent in the lower and middle 3rd of the esophagus with friable exudates and erosions. Colonoscopy on January 14 showed rectal ulcers  She underwent CT angiogram of the abdomen and pelvis which shows overall vascular findings most suggestive of pulmonary arterial hypertension and right-sided heart dysfunction consistent with cor pulmonale. 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  Objective:  Vital signs in last 24 hours:  Temp:  [97.8 F (36.6 C)-98.6 F (37 C)] 98.6 F (37 C) (02/25 1229) Pulse Rate:  [81-89] 89 (02/25 1229) Resp:  [18-22] 22 (02/25 1229) BP: (104-114)/(72-81) 111/72 mmHg (02/25 1229) SpO2:  [97 %-100 %] 100 % (02/25 1229) Weight:  [57.743 kg (127 lb 4.8 oz)] 57.743 kg (127 lb 4.8 oz) (02/25 0411)  Weight change: 0.68 kg (1 lb 8 oz) Filed Weights   02/07/16 0500 02/08/16 0440 02/09/16 0411  Weight: 54.5 kg (120 lb 2.4 oz) 57.063 kg (125 lb 12.8 oz) 57.743 kg (127 lb 4.8 oz)    Intake/Output:    Intake/Output Summary (Last 24 hours) at 02/09/16 1248 Last data filed at 02/09/16 0900  Gross per 24 hour  Intake    840 ml  Output      0 ml  Net    840 ml     Physical Exam: General: Thin, frail  HEENT Pale conjunctiva, moist oral mucous membranes  Neck supple  Pulm/lungs Lungs are clear to auscultation bilaterally  CVS/Heart No rub or gallop  Abdomen:  Soft, nontender,  Extremities: Right leg edema, no edema on left leg   Neurologic: Right hemiparesis, right arm weakness  Skin: No acute rashes   Access: PD catheter, site nontender       Basic Metabolic Panel:   Recent Labs Lab 02/06/16 1856 02/07/16 0717  NA 133* 134*  K 4.9 5.1  CL 98* 100*  CO2 18* 18*  GLUCOSE 88 103*  BUN 92* 95*  CREATININE 7.97* 8.09*  CALCIUM 8.2* 8.0*     CBC:  Recent Labs Lab 02/06/16 1856 02/07/16 0717 02/07/16 1315 02/07/16 1933 02/08/16 0246 02/08/16 1249  WBC 8.1 9.4  --   --   --   --   HGB 4.5* 7.0* 7.1* 7.3* 8.6* 8.1*  HCT 14.1* 21.4*  --   --  25.1*  --   MCV 91.9 88.6  --   --   --   --   PLT 194 163  --   --   --   --       Microbiology:  Recent Results (from the past 720 hour(s))  MRSA PCR Screening     Status: None   Collection Time: 02/06/16 11:22 PM  Result Value Ref Range Status   MRSA by PCR NEGATIVE NEGATIVE Final    Comment:        The GeneXpert MRSA Assay (FDA approved for NASAL specimens only), is one component of a comprehensive MRSA colonization surveillance program. It is not intended to diagnose MRSA infection nor to guide or monitor treatment for MRSA infections.     Coagulation Studies:  Recent Labs  02/06/16 2022  LABPROT 17.0*  INR 1.37    Urinalysis: No results for input(s): COLORURINE, LABSPEC, PHURINE, GLUCOSEU, HGBUR, BILIRUBINUR, KETONESUR, PROTEINUR, UROBILINOGEN, NITRITE, LEUKOCYTESUR in the last 72 hours.  Invalid input(s): APPERANCEUR    Imaging: Ct Angio Chest Pe W/cm &/or Wo Cm  02/08/2016  CLINICAL DATA:  55 year old female with a history of possible systemic venous obstruction EXAM: CT ANGIOGRAPHY AND VENOGRAPHY CHEST, ABDOMEN AND PELVIS TECHNIQUE: Multidetector CT imaging through the chest, abdomen and pelvis was performed using the standard protocol during bolus administration of intravenous contrast. Multiplanar reconstructed images and MIPs were obtained and reviewed to evaluate the vascular anatomy. CONTRAST:  100 mL Omnipaque 350 COMPARISON:  Prior CT of abdomen and pelvis 06/22/2015; prior CT scan of the  chest 02/09/2015 FINDINGS: CTA CHEST FINDINGS Mediastinum: Unremarkable CT appearance of the thyroid gland. No suspicious mediastinal or hilar adenopathy. No soft tissue mediastinal mass. The thoracic esophagus is unremarkable. Heart/Vascular: Pulmonary arterial phase imaging demonstrates excellent opacification of the pulmonary arteries to the proximal subsegmental level. There is no evidence of pulmonary embolus. The main pulmonary artery is enlarged measuring up to 3.6 cm in diameter. This is larger than the adjacent aorta. The heart is also markedly enlarged, particularly the right heart consistent with cor pulmonale. Case ease calcification is present along the mitral valve annulus. Atherosclerotic calcifications present along the left anterior descending, circumflex and right coronary arteries. No pericardial effusion. Lungs/Pleura: Large layering right pleural effusion with associated right lower lobe atelectasis. No definite pleural enhancement or nodularity. Perhaps mild interlobular septal thickening and ground-glass attenuation airspace opacity in the lungs consistent with mild interstitial edema. Diffuse mild bronchial wall thickening. On the axial images, there is a somewhat nodular opacity in the medial aspect of the left lower lobe measuring 10 x 8 mm. However, on the coronal and sagittal reformatted images this opacity appears more linear and is therefore strongly favored to represent a region of atelectasis. Bones/Soft Tissues: No acute fracture or aggressive appearing lytic or blastic osseous lesion. Review of the MIP images confirms the above findings. CTA ABDOMEN AND PELVIS FINDINGS VASCULAR Aorta: Normal caliber abdominal aorta without evidence of aneurysm. Scattered atherosclerotic plaque is present throughout. No significant stenosis or evidence of dissection. Celiac: High-grade stenosis versus occlusion of the proximal aspect of the celiac artery secondary to bulky and calcified  atherosclerotic plaque. The distal branches reconstitute, possibly through collateralization via the pancreaticoduodenal arcade. SMA: Superior mesenteric artery is widely patent. Prominent pancreaticoduodenal arcade. Renals: The native kidneys are atretic bilaterally. The renal arteries are miniscule and not well seen. IMA: Atretic superior mesenteric artery.  No definite stenosis. Inflow: Calcified atherosclerotic plaque results in focal high-grade stenosis versus short segment occlusion in the left common iliac artery. There is a focal irregular calcified plaque extending into the proximal right common iliac artery resulting in an approximately 50% stenosis. High-grade stenosis of the origin of the left internal iliac artery. The right internal iliac artery is patent. The external iliac arteries are relatively disease free. Proximal Outflow: Diseased bilateral superficial femoral arteries. At least moderate stenosis in the proximal left SFA and mild stenosis in the proximal right SFA. The vessels remain patent into the upper thighs. Veins: Dedicated venous phase imaging demonstrates engorged ment of the suprahepatic IVC. A potentially retrievable IVC filter is present in the infrarenal IVC. This appears to be a [employer]. No evidence of leg penetration, fracture or other complication. No thrombus. The IVC remains patent throughout its course. The iliac and  visualized femoral veins are also patent. NON-VASCULAR Abdomen: Abnormal appearance of the liver with enlargement of the left lobe and relative atrophy of the right lobe. Additionally, there is a macronodular contour of the liver surface. Findings raise concern for cirrhotic change versus severe congestive hit the top of the related to chronic right heart failure. No discrete lesion identified. The gallbladder is surgically absent. No intra or extrahepatic biliary ductal dilatation. Unremarkable CT appearance of the stomach, duodenum, spleen, adrenal  glands and pancreas. The native kidneys are atrophic. No evidence of obstruction or focal bowel wall thickening. Normal appendix in the right lower quadrant. The terminal ileum is unremarkable. A peritoneal dialysis catheter is coiled in the pelvic recess. There is small volume ascites which is not unexpected. Pelvis: Surgical changes of prior hysterectomy. Mild pelvic floor laxity. Free ascites related to peritoneal dialysis. Bones/Soft Tissues: No acute fracture or aggressive appearing lytic or blastic osseous lesion. The bones appear mildly mottled diffusely. This appearance is favored to represent demineralization/osteopenia. Renal osteodystrophy is also a possibility. Review of the MIP images confirms the above findings. IMPRESSION: VASCULAR 1. Overall vascular findings are most suggestive of pulmonary arterial hypertension and right-sided heart dysfunction consistent with cor pulmonale. 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 2. No evidence of acute pulmonary embolus, aneurysm, or dissection. 3. Critical stenosis versus short focal occlusion of the origin of the celiac artery. The distal branch vessels remain patent due in part to collateralization through the pancreaticoduodenal arcade. 4. Atrophic renal arteries bilaterally with associated atrophy of the native kidneys. 5. Peripheral arterial disease with high-grade stenosis versus short segment occlusion of the left common iliac artery, moderate to high-grade stenosis of the proximal right common iliac artery, high-grade stenosis of the origin of the left internal iliac artery, and mild to moderate superficial femoral artery disease (worse on the left than the right) in the visualized segments of the upper thighs. 6. No evidence of venous thrombosis or obstruction. 7. Well-positioned infrarenal potentially retrievable IVC filter. NON VASCULAR 1.  Cardiomegaly with right heart enlargement. 2. Mild interstitial pulmonary edema suggests congestive heart failure. 3. Large layering right pleural effusion with associated right lower lobe atelectasis. 4. Dependent atelectasis in the left lower lobe. 5. 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 Other etiologies for cirrhosis including but not limited to chronic hepatitis, alcoholism are not excluded radiographically. 6. Peritoneal dialysis catheter coiled in the pelvis with associated ascites/dialysate. 7. Mild pelvic floor laxity. 8. Surgical changes of prior hysterectomy and cholecystectomy. 9. Diffusely mottled appearance of the bones favored to represent either demineralization/osteopenia or renal osteodystrophy. 10. Atrophic native kidneys. Signed, Sterling Big, MD [facility] Electronically Signed   By: Malachy Moan M.D.   On: 02/08/2016 10:25   Ct Angio Abd/pel W/ And/or W/o  02/08/2016  CLINICAL DATA:  55 year old female with a history of possible systemic venous obstruction EXAM: CT ANGIOGRAPHY AND VENOGRAPHY CHEST, ABDOMEN AND PELVIS TECHNIQUE: Multidetector CT imaging through the chest, abdomen and pelvis was performed using the standard protocol during bolus administration of intravenous contrast. Multiplanar reconstructed images and MIPs were obtained and reviewed to evaluate the vascular anatomy. CONTRAST:  100 mL Omnipaque 350 COMPARISON:  Prior CT of abdomen and pelvis 06/22/2015; prior CT scan of the chest 02/09/2015 FINDINGS: CTA CHEST FINDINGS Mediastinum: Unremarkable CT appearance of the thyroid gland. No suspicious mediastinal or hilar adenopathy. No soft tissue  mediastinal mass. The thoracic esophagus is unremarkable. Heart/Vascular: Pulmonary arterial phase imaging demonstrates excellent opacification of the pulmonary arteries to the proximal subsegmental level. There is no evidence of pulmonary  embolus. The main pulmonary artery is enlarged measuring up to 3.6 cm in diameter. This is larger than the adjacent aorta. The heart is also markedly enlarged, particularly the right heart consistent with cor pulmonale. Case ease calcification is present along the mitral valve annulus. Atherosclerotic calcifications present along the left anterior descending, circumflex and right coronary arteries. No pericardial effusion. Lungs/Pleura: Large layering right pleural effusion with associated right lower lobe atelectasis. No definite pleural enhancement or nodularity. Perhaps mild interlobular septal thickening and ground-glass attenuation airspace opacity in the lungs consistent with mild interstitial edema. Diffuse mild bronchial wall thickening. On the axial images, there is a somewhat nodular opacity in the medial aspect of the left lower lobe measuring 10 x 8 mm. However, on the coronal and sagittal reformatted images this opacity appears more linear and is therefore strongly favored to represent a region of atelectasis. Bones/Soft Tissues: No acute fracture or aggressive appearing lytic or blastic osseous lesion. Review of the MIP images confirms the above findings. CTA ABDOMEN AND PELVIS FINDINGS VASCULAR Aorta: Normal caliber abdominal aorta without evidence of aneurysm. Scattered atherosclerotic plaque is present throughout. No significant stenosis or evidence of dissection. Celiac: High-grade stenosis versus occlusion of the proximal aspect of the celiac artery secondary to bulky and calcified atherosclerotic plaque. The distal branches reconstitute, possibly through collateralization via the pancreaticoduodenal arcade. SMA: Superior mesenteric artery is widely patent. Prominent pancreaticoduodenal arcade. Renals: The native kidneys are atretic bilaterally. The renal arteries are miniscule and not well seen. IMA: Atretic superior mesenteric artery.  No definite stenosis. Inflow: Calcified atherosclerotic  plaque results in focal high-grade stenosis versus short segment occlusion in the left common iliac artery. There is a focal irregular calcified plaque extending into the proximal right common iliac artery resulting in an approximately 50% stenosis. High-grade stenosis of the origin of the left internal iliac artery. The right internal iliac artery is patent. The external iliac arteries are relatively disease free. Proximal Outflow: Diseased bilateral superficial femoral arteries. At least moderate stenosis in the proximal left SFA and mild stenosis in the proximal right SFA. The vessels remain patent into the upper thighs. Veins: Dedicated venous phase imaging demonstrates engorged ment of the suprahepatic IVC. A potentially retrievable IVC filter is present in the infrarenal IVC. This appears to be a [employer]. No evidence of leg penetration, fracture or other complication. No thrombus. The IVC remains patent throughout its course. The iliac and visualized femoral veins are also patent. NON-VASCULAR Abdomen: Abnormal appearance of the liver with enlargement of the left lobe and relative atrophy of the right lobe. Additionally, there is a macronodular contour of the liver surface. Findings raise concern for cirrhotic change versus severe congestive hit the top of the related to chronic right heart failure. No discrete lesion identified. The gallbladder is surgically absent. No intra or extrahepatic biliary ductal dilatation. Unremarkable CT appearance of the stomach, duodenum, spleen, adrenal glands and pancreas. The native kidneys are atrophic. No evidence of obstruction or focal bowel wall thickening. Normal appendix in the right lower quadrant. The terminal ileum is unremarkable. A peritoneal dialysis catheter is coiled in the pelvic recess. There is small volume ascites which is not unexpected. Pelvis: Surgical changes of prior hysterectomy. Mild pelvic floor laxity. Free ascites related to peritoneal  dialysis. Bones/Soft Tissues: No acute fracture or  aggressive appearing lytic or blastic osseous lesion. The bones appear mildly mottled diffusely. This appearance is favored to represent demineralization/osteopenia. Renal osteodystrophy is also a possibility. Review of the MIP images confirms the above findings. IMPRESSION: VASCULAR 1. Overall vascular findings are most suggestive of pulmonary arterial hypertension and right-sided heart dysfunction consistent with cor pulmonale. 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 2. No evidence of acute pulmonary embolus, aneurysm, or dissection. 3. Critical stenosis versus short focal occlusion of the origin of the celiac artery. The distal branch vessels remain patent due in part to collateralization through the pancreaticoduodenal arcade. 4. Atrophic renal arteries bilaterally with associated atrophy of the native kidneys. 5. Peripheral arterial disease with high-grade stenosis versus short segment occlusion of the left common iliac artery, moderate to high-grade stenosis of the proximal right common iliac artery, high-grade stenosis of the origin of the left internal iliac artery, and mild to moderate superficial femoral artery disease (worse on the left than the right) in the visualized segments of the upper thighs. 6. No evidence of venous thrombosis or obstruction. 7. Well-positioned infrarenal potentially retrievable IVC filter. NON VASCULAR 1. Cardiomegaly with right heart enlargement. 2. Mild interstitial pulmonary edema suggests congestive heart failure. 3. Large layering right pleural effusion with associated right lower lobe atelectasis. 4. Dependent atelectasis in the left lower lobe. 5. 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 Other etiologies for cirrhosis including but not limited to chronic hepatitis, alcoholism are not excluded  radiographically. 6. Peritoneal dialysis catheter coiled in the pelvis with associated ascites/dialysate. 7. Mild pelvic floor laxity. 8. Surgical changes of prior hysterectomy and cholecystectomy. 9. Diffusely mottled appearance of the bones favored to represent either demineralization/osteopenia or renal osteodystrophy. 10. Atrophic native kidneys. Signed, Sterling Big, MD [facility] Electronically Signed   By: Malachy Moan M.D.   On: 02/08/2016 10:25     Medications:   . pantoprozole (PROTONIX) infusion 8 mg/hr (02/09/16 0439)   . sodium chloride   Intravenous Once  . atorvastatin  80 mg Oral q1800  . dialysis solution 1.5% low-MG/low-CA   Intraperitoneal Q24H  . gentamicin ointment   Topical Daily  . insulin aspart  0-9 Units Subcutaneous 6 times per day  . sodium chloride flush  3 mL Intravenous Q12H  . sucralfate  1 g Oral 4 times per day   bisacodyl, heparin, ipratropium-albuterol, morphine injection, ondansetron **OR** ondansetron (ZOFRAN) IV, phenol-menthol, sodium phosphate  Assessment/ Plan:  54 y.o. female with history of end-stage renal disease (FSGS) currently on PD, paroxysmal atrial fibrillation, COPD, cirrhosis, stroke with left-sided weakness 2017,severe mitral stenosis,depression, thrombocytopenia, and negative anticardiolipin antibody, weakly positive ANA, acute GI bleed in January 2017 caused by severe esophagitis and bleeding rectal ulcer, IVC filter  1.  End-stage renal disease - Will continue peritoneal dialysis while she is in the hospital  2. Anemia of GI bleed and chronic Kidney Disease - patient received blood transfusion - Will avoid Procrit for now because of recent DVT and stroke - sucralfate   3.  Secondary hyperparathyroidism -  Monitor Phos while in the hospital - avoid fleets enema in dialysis patient - Prefer Soap suds enema if necessary   ,   LOS:  3 Mlissa Tamayo 2/25/201712:48 PM

## 2016-02-10 LAB — GLUCOSE, CAPILLARY
GLUCOSE-CAPILLARY: 97 mg/dL (ref 65–99)
GLUCOSE-CAPILLARY: 88 mg/dL (ref 65–99)
GLUCOSE-CAPILLARY: 101 mg/dL — AB (ref 65–99)
Glucose-Capillary: 100 mg/dL — ABNORMAL HIGH (ref 65–99)
Glucose-Capillary: 98 mg/dL (ref 65–99)
Glucose-Capillary: 90 mg/dL (ref 65–99)

## 2016-02-10 LAB — HEMOGLOBIN: HEMOGLOBIN: 7.8 g/dL — AB (ref 12.0–16.0)

## 2016-02-10 MED ORDER — NEPRO/CARBSTEADY PO LIQD
237.0000 mL | Freq: Two times a day (BID) | ORAL | Status: DC
  Administered 2016-02-10 – 2016-02-11 (×4): 237 mL via ORAL

## 2016-02-10 MED ORDER — ALUM & MAG HYDROXIDE-SIMETH 200-200-20 MG/5ML PO SUSP
30.0000 mL | Freq: Four times a day (QID) | ORAL | Status: DC | PRN
Start: 2016-02-10 — End: 2016-02-12
  Administered 2016-02-10: 30 mL via ORAL
  Filled 2016-02-10: qty 30

## 2016-02-10 NOTE — Progress Notes (Signed)
GI Note:  Hgb stable.  No bleeding.  Per cardiology, recommend continued dialysis for management of pulm HTN and pulm edema.   Dr Mechele Collin to resume care on Monday.

## 2016-02-10 NOTE — Progress Notes (Signed)
[facility] Cardiology [facility] Encounter Note  Patient: Dorothy Long / Admit Date: 02/06/2016 / Date of Encounter: 02/10/2016, 6:44 AM   Subjective: Patient is still very weak with some shortness of breath and unable to do any ambulation. There is no evidence of overt heart failure symptoms. No evidence of   bleeding  Review of Systems: Positive for: Weakness fatigue Negative for: Vision change, hearing change, syncope, dizziness, nausea, vomiting,diarrhea,  stomach pain, cough, congestion, diaphoresis, urinary frequency, urinary pain,skin lesions, skin rashes Others previously listed  Objective: Telemetry: Normal sinus rhythm Physical Exam: Blood pressure 108/80, pulse 78, temperature 98.6 F (37 C), temperature source Oral, resp. rate 18, height 5\' 10"  (1.778 m), weight 138 lb 8 oz (62.823 kg), SpO2 98 %. Body mass index is 19.87 kg/(m^2). General: Well developed, well nourished, in no acute distress. Head: Normocephalic, atraumatic, sclera non-icteric, no xanthomas, nares are without discharge. Neck: No apparent masses Lungs: Normal respirations with no wheezes, no rhonchi, no rales , no crackles   Heart: Regular rate and rhythm, normal S1 S2, 4+ apical murmur, no rub, prominent gallop and RV heave PMI is normal size and inferior placement, carotid upstroke normal without bruit, jugular venous pressure normal Abdomen: Soft, non-tender, non-distended with normoactive bowel sounds. No hepatosplenomegaly. Abdominal aorta is normal size without bruit Extremities: Trace edema, no clubbing, no cyanosis, no ulcers,  Peripheral: 2+ radial, 2+ femoral, 2+ dorsal pedal pulses Neuro: Alert and oriented. Moves all extremities spontaneously. Psych:  Responds to questions appropriately with a normal affect.   Intake/Output Summary (Last 24 hours) at 02/10/16 0644 Last data filed at 02/09/16 1700  Gross per 24 hour  Intake   1212 ml  Output      0 ml  Net   1212 ml    Inpatient  Medications:  . sodium chloride   Intravenous Once  . atorvastatin  80 mg Oral q1800  . dialysis solution 1.5% low-MG/low-CA   Intraperitoneal Q24H  . gentamicin ointment   Topical Daily  . insulin aspart  0-9 Units Subcutaneous 6 times per day  . pantoprazole  40 mg Oral BID  . sodium chloride flush  3 mL Intravenous Q12H  . sucralfate  1 g Oral 4 times per day   Infusions:     Labs:  Recent Labs  02/07/16 0717  NA 134*  K 5.1  CL 100*  CO2 18*  GLUCOSE 103*  BUN 95*  CREATININE 8.09*  CALCIUM 8.0*   No results for input(s): AST, ALT, ALKPHOS, BILITOT, PROT, ALBUMIN in the last 72 hours.  Recent Labs  02/07/16 0717  02/08/16 0246 02/08/16 1249 02/10/16 0528  WBC 9.4  --   --   --   --   HGB 7.0*  < > 8.6* 8.1* 7.8*  HCT 21.4*  --  25.1*  --   --   MCV 88.6  --   --   --   --   PLT 163  --   --   --   --   < > = values in this interval not displayed. No results for input(s): CKTOTAL, CKMB, TROPONINI in the last 72 hours. Invalid input(s): POCBNP No results for input(s): HGBA1C in the last 72 hours.   Weights: Filed Weights   02/08/16 0440 02/09/16 0411 02/09/16 2110  Weight: 125 lb 12.8 oz (57.063 kg) 127 lb 4.8 oz (57.743 kg) 138 lb 8 oz (62.823 kg)     Radiology/Studies:  Ct Angio Chest Pe W/cm &/or  Wo Cm  02/08/2016  CLINICAL DATA:  55 year old female with a history of possible systemic venous obstruction EXAM: CT ANGIOGRAPHY AND VENOGRAPHY CHEST, ABDOMEN AND PELVIS TECHNIQUE: Multidetector CT imaging through the chest, abdomen and pelvis was performed using the standard protocol during bolus administration of intravenous contrast. Multiplanar reconstructed images and MIPs were obtained and reviewed to evaluate the vascular anatomy. CONTRAST:  100 mL Omnipaque 350 COMPARISON:  Prior CT of abdomen and pelvis 06/22/2015; prior CT scan of the chest 02/09/2015 FINDINGS: CTA CHEST FINDINGS Mediastinum: Unremarkable CT appearance of the thyroid gland. No suspicious  mediastinal or hilar adenopathy. No soft tissue mediastinal mass. The thoracic esophagus is unremarkable. Heart/Vascular: Pulmonary arterial phase imaging demonstrates excellent opacification of the pulmonary arteries to the proximal subsegmental level. There is no evidence of pulmonary embolus. The main pulmonary artery is enlarged measuring up to 3.6 cm in diameter. This is larger than the adjacent aorta. The heart is also markedly enlarged, particularly the right heart consistent with cor pulmonale. Case ease calcification is present along the mitral valve annulus. Atherosclerotic calcifications present along the left anterior descending, circumflex and right coronary arteries. No pericardial effusion. Lungs/Pleura: Large layering right pleural effusion with associated right lower lobe atelectasis. No definite pleural enhancement or nodularity. Perhaps mild interlobular septal thickening and ground-glass attenuation airspace opacity in the lungs consistent with mild interstitial edema. Diffuse mild bronchial wall thickening. On the axial images, there is a somewhat nodular opacity in the medial aspect of the left lower lobe measuring 10 x 8 mm. However, on the coronal and sagittal reformatted images this opacity appears more linear and is therefore strongly favored to represent a region of atelectasis. Bones/Soft Tissues: No acute fracture or aggressive appearing lytic or blastic osseous lesion. Review of the MIP images confirms the above findings. CTA ABDOMEN AND PELVIS FINDINGS VASCULAR Aorta: Normal caliber abdominal aorta without evidence of aneurysm. Scattered atherosclerotic plaque is present throughout. No significant stenosis or evidence of dissection. Celiac: High-grade stenosis versus occlusion of the proximal aspect of the celiac artery secondary to bulky and calcified atherosclerotic plaque. The distal branches reconstitute, possibly through collateralization via the pancreaticoduodenal arcade. SMA:  Superior mesenteric artery is widely patent. Prominent pancreaticoduodenal arcade. Renals: The native kidneys are atretic bilaterally. The renal arteries are miniscule and not well seen. IMA: Atretic superior mesenteric artery.  No definite stenosis. Inflow: Calcified atherosclerotic plaque results in focal high-grade stenosis versus short segment occlusion in the left common iliac artery. There is a focal irregular calcified plaque extending into the proximal right common iliac artery resulting in an approximately 50% stenosis. High-grade stenosis of the origin of the left internal iliac artery. The right internal iliac artery is patent. The external iliac arteries are relatively disease free. Proximal Outflow: Diseased bilateral superficial femoral arteries. At least moderate stenosis in the proximal left SFA and mild stenosis in the proximal right SFA. The vessels remain patent into the upper thighs. Veins: Dedicated venous phase imaging demonstrates engorged ment of the suprahepatic IVC. A potentially retrievable IVC filter is present in the infrarenal IVC. This appears to be a [employer]. No evidence of leg penetration, fracture or other complication. No thrombus. The IVC remains patent throughout its course. The iliac and visualized femoral veins are also patent. NON-VASCULAR Abdomen: Abnormal appearance of the liver with enlargement of the left lobe and relative atrophy of the right lobe. Additionally, there is a macronodular contour of the liver surface. Findings raise concern for cirrhotic change versus severe congestive hit  the top of the related to chronic right heart failure. No discrete lesion identified. The gallbladder is surgically absent. No intra or extrahepatic biliary ductal dilatation. Unremarkable CT appearance of the stomach, duodenum, spleen, adrenal glands and pancreas. The native kidneys are atrophic. No evidence of obstruction or focal bowel wall thickening. Normal appendix in the  right lower quadrant. The terminal ileum is unremarkable. A peritoneal dialysis catheter is coiled in the pelvic recess. There is small volume ascites which is not unexpected. Pelvis: Surgical changes of prior hysterectomy. Mild pelvic floor laxity. Free ascites related to peritoneal dialysis. Bones/Soft Tissues: No acute fracture or aggressive appearing lytic or blastic osseous lesion. The bones appear mildly mottled diffusely. This appearance is favored to represent demineralization/osteopenia. Renal osteodystrophy is also a possibility. Review of the MIP images confirms the above findings. IMPRESSION: VASCULAR 1. Overall vascular findings are most suggestive of pulmonary arterial hypertension and right-sided heart dysfunction consistent with cor pulmonale. 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 2. No evidence of acute pulmonary embolus, aneurysm, or dissection. 3. Critical stenosis versus short focal occlusion of the origin of the celiac artery. The distal branch vessels remain patent due in part to collateralization through the pancreaticoduodenal arcade. 4. Atrophic renal arteries bilaterally with associated atrophy of the native kidneys. 5. Peripheral arterial disease with high-grade stenosis versus short segment occlusion of the left common iliac artery, moderate to high-grade stenosis of the proximal right common iliac artery, high-grade stenosis of the origin of the left internal iliac artery, and mild to moderate superficial femoral artery disease (worse on the left than the right) in the visualized segments of the upper thighs. 6. No evidence of venous thrombosis or obstruction. 7. Well-positioned infrarenal potentially retrievable IVC filter. NON VASCULAR 1. Cardiomegaly with right heart enlargement. 2. Mild interstitial pulmonary edema suggests congestive heart failure. 3. Large layering right  pleural effusion with associated right lower lobe atelectasis. 4. Dependent atelectasis in the left lower lobe. 5. 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 Other etiologies for cirrhosis including but not limited to chronic hepatitis, alcoholism are not excluded radiographically. 6. Peritoneal dialysis catheter coiled in the pelvis with associated ascites/dialysate. 7. Mild pelvic floor laxity. 8. Surgical changes of prior hysterectomy and cholecystectomy. 9. Diffusely mottled appearance of the bones favored to represent either demineralization/osteopenia or renal osteodystrophy. 10. Atrophic native kidneys. Signed, Sterling Big, MD [facility] Electronically Signed   By: Malachy Moan M.D.   On: 02/08/2016 10:25   US Venous Img Lower Unilateral Right  02/07/2016  CLINICAL DATA:  Swelling. EXAM: RIGHT LOWER EXTREMITY VENOUS DOPPLER ULTRASOUND TECHNIQUE: Gray-scale sonography with graded compression, as well as color Doppler and duplex ultrasound were performed to evaluate the lower extremity deep venous systems from the level of the common femoral vein and including the common femoral, femoral, profunda femoral, popliteal and calf veins including the posterior tibial, peroneal and gastrocnemius veins when visible. The superficial great saphenous vein was also interrogated. Spectral Doppler was utilized to evaluate flow at rest and with distal augmentation maneuvers in the common femoral, femoral and popliteal veins. COMPARISON:  No prior. FINDINGS: Contralateral Common Femoral Vein: Respiratory phasicity is normal and symmetric with the symptomatic side. No evidence of thrombus. Normal compressibility. Common Femoral Vein: No evidence of thrombus. Normal compressibility, respiratory phasicity and response to augmentation. Saphenofemoral Junction: No evidence of thrombus. Normal compressibility and flow on color  Doppler  imaging. Profunda Femoral Vein: No evidence of thrombus. Normal compressibility and flow on color Doppler imaging. Femoral Vein: No evidence of thrombus. Normal compressibility, respiratory phasicity and response to augmentation. Popliteal Vein: No evidence of thrombus. Normal compressibility, respiratory phasicity and response to augmentation. Calf Veins: No evidence of thrombus. Normal compressibility and flow on color Doppler imaging. Superficial Great Saphenous Vein: No evidence of thrombus. Normal compressibility and flow on color Doppler imaging. Venous Reflux:  None. Other Findings:  None. IMPRESSION: No evidence of deep venous thrombosis. Electronically Signed   By: Maisie Fus  Register   On: 02/07/2016 12:50   Ct Angio Abd/pel W/ And/or W/o  02/08/2016  CLINICAL DATA:  55 year old female with a history of possible systemic venous obstruction EXAM: CT ANGIOGRAPHY AND VENOGRAPHY CHEST, ABDOMEN AND PELVIS TECHNIQUE: Multidetector CT imaging through the chest, abdomen and pelvis was performed using the standard protocol during bolus administration of intravenous contrast. Multiplanar reconstructed images and MIPs were obtained and reviewed to evaluate the vascular anatomy. CONTRAST:  100 mL Omnipaque 350 COMPARISON:  Prior CT of abdomen and pelvis 06/22/2015; prior CT scan of the chest 02/09/2015 FINDINGS: CTA CHEST FINDINGS Mediastinum: Unremarkable CT appearance of the thyroid gland. No suspicious mediastinal or hilar adenopathy. No soft tissue mediastinal mass. The thoracic esophagus is unremarkable. Heart/Vascular: Pulmonary arterial phase imaging demonstrates excellent opacification of the pulmonary arteries to the proximal subsegmental level. There is no evidence of pulmonary embolus. The main pulmonary artery is enlarged measuring up to 3.6 cm in diameter. This is larger than the adjacent aorta. The heart is also markedly enlarged, particularly the right heart consistent with cor pulmonale. Case ease  calcification is present along the mitral valve annulus. Atherosclerotic calcifications present along the left anterior descending, circumflex and right coronary arteries. No pericardial effusion. Lungs/Pleura: Large layering right pleural effusion with associated right lower lobe atelectasis. No definite pleural enhancement or nodularity. Perhaps mild interlobular septal thickening and ground-glass attenuation airspace opacity in the lungs consistent with mild interstitial edema. Diffuse mild bronchial wall thickening. On the axial images, there is a somewhat nodular opacity in the medial aspect of the left lower lobe measuring 10 x 8 mm. However, on the coronal and sagittal reformatted images this opacity appears more linear and is therefore strongly favored to represent a region of atelectasis. Bones/Soft Tissues: No acute fracture or aggressive appearing lytic or blastic osseous lesion. Review of the MIP images confirms the above findings. CTA ABDOMEN AND PELVIS FINDINGS VASCULAR Aorta: Normal caliber abdominal aorta without evidence of aneurysm. Scattered atherosclerotic plaque is present throughout. No significant stenosis or evidence of dissection. Celiac: High-grade stenosis versus occlusion of the proximal aspect of the celiac artery secondary to bulky and calcified atherosclerotic plaque. The distal branches reconstitute, possibly through collateralization via the pancreaticoduodenal arcade. SMA: Superior mesenteric artery is widely patent. Prominent pancreaticoduodenal arcade. Renals: The native kidneys are atretic bilaterally. The renal arteries are miniscule and not well seen. IMA: Atretic superior mesenteric artery.  No definite stenosis. Inflow: Calcified atherosclerotic plaque results in focal high-grade stenosis versus short segment occlusion in the left common iliac artery. There is a focal irregular calcified plaque extending into the proximal right common iliac artery resulting in an  approximately 50% stenosis. High-grade stenosis of the origin of the left internal iliac artery. The right internal iliac artery is patent. The external iliac arteries are relatively disease free. Proximal Outflow: Diseased bilateral superficial femoral arteries. At least moderate stenosis in the proximal left SFA and mild stenosis in  the proximal right SFA. The vessels remain patent into the upper thighs. Veins: Dedicated venous phase imaging demonstrates engorged ment of the suprahepatic IVC. A potentially retrievable IVC filter is present in the infrarenal IVC. This appears to be a [employer]. No evidence of leg penetration, fracture or other complication. No thrombus. The IVC remains patent throughout its course. The iliac and visualized femoral veins are also patent. NON-VASCULAR Abdomen: Abnormal appearance of the liver with enlargement of the left lobe and relative atrophy of the right lobe. Additionally, there is a macronodular contour of the liver surface. Findings raise concern for cirrhotic change versus severe congestive hit the top of the related to chronic right heart failure. No discrete lesion identified. The gallbladder is surgically absent. No intra or extrahepatic biliary ductal dilatation. Unremarkable CT appearance of the stomach, duodenum, spleen, adrenal glands and pancreas. The native kidneys are atrophic. No evidence of obstruction or focal bowel wall thickening. Normal appendix in the right lower quadrant. The terminal ileum is unremarkable. A peritoneal dialysis catheter is coiled in the pelvic recess. There is small volume ascites which is not unexpected. Pelvis: Surgical changes of prior hysterectomy. Mild pelvic floor laxity. Free ascites related to peritoneal dialysis. Bones/Soft Tissues: No acute fracture or aggressive appearing lytic or blastic osseous lesion. The bones appear mildly mottled diffusely. This appearance is favored to represent demineralization/osteopenia. Renal  osteodystrophy is also a possibility. Review of the MIP images confirms the above findings. IMPRESSION: VASCULAR 1. Overall vascular findings are most suggestive of pulmonary arterial hypertension and right-sided heart dysfunction consistent with cor pulmonale. 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 2. No evidence of acute pulmonary embolus, aneurysm, or dissection. 3. Critical stenosis versus short focal occlusion of the origin of the celiac artery. The distal branch vessels remain patent due in part to collateralization through the pancreaticoduodenal arcade. 4. Atrophic renal arteries bilaterally with associated atrophy of the native kidneys. 5. Peripheral arterial disease with high-grade stenosis versus short segment occlusion of the left common iliac artery, moderate to high-grade stenosis of the proximal right common iliac artery, high-grade stenosis of the origin of the left internal iliac artery, and mild to moderate superficial femoral artery disease (worse on the left than the right) in the visualized segments of the upper thighs. 6. No evidence of venous thrombosis or obstruction. 7. Well-positioned infrarenal potentially retrievable IVC filter. NON VASCULAR 1. Cardiomegaly with right heart enlargement. 2. Mild interstitial pulmonary edema suggests congestive heart failure. 3. Large layering right pleural effusion with associated right lower lobe atelectasis. 4. Dependent atelectasis in the left lower lobe. 5. 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 Other etiologies for cirrhosis including but not limited to chronic hepatitis, alcoholism are not excluded radiographically. 6. Peritoneal dialysis catheter coiled in the pelvis with associated ascites/dialysate. 7. Mild pelvic floor laxity. 8. Surgical changes of prior hysterectomy and cholecystectomy. 9. Diffusely mottled  appearance of the bones favored to represent either demineralization/osteopenia or renal osteodystrophy. 10. Atrophic native kidneys. Signed, Sterling Big, MD [facility] Electronically Signed   By: Malachy Moan M.D.   On: 02/08/2016 10:25     Assessment and Recommendation  55 y.o. female with chronic kidney disease stage V and previous vascular disease with stroke having significant side effects of this illness  Echocardiogram with normal LV systolic function with ejection fraction of 50% although mitral valve has significant regurgitation  as well as thickening and calcification. There is moderate pulmonary hypertension possibly causing elevated neck veins as well as shortness of breath  1. Continue dialysis for improvements of pulmonary edema and pulmonary hypertension   2. No aspirin or other anticoagulants due to recent bleeding 3. Patient may proceed to upper endoscopy for further evaluation of bleeding and its complications with the currently patient being stable from the cardiac standpoint without evidence of heart failure  4. High intensity cholesterol therapy with atorvastatin 5. No further cardiac diagnostics necessary at this time  Signed, Arnoldo Hooker M.D. FACC

## 2016-02-10 NOTE — Plan of Care (Signed)
Problem: Bowel/Gastric: Goal: Will show no signs and symptoms of gastrointestinal bleeding Outcome: Progressing No signs and/or symptoms of bleeding noted.

## 2016-02-10 NOTE — Progress Notes (Signed)
CSW consult for nursing home placement.  CSW met with patient and husband.  Patient was just release from inpatient rehab.  Per husband he does not think the insurance will approve for patient to return.  However if they will approve he would like patient to go to [facility] rehab.  Husband states patient currently has home health [facility] with PT, OT, SP and RN and is ok to return home with these services.  Patient and husband are not interested in SNF.    CSW signing off.  Casimer Lanius. [place] Work Department [phone] 5:25 PM

## 2016-02-10 NOTE — Progress Notes (Signed)
Subjective:  The patient admitted to the hospital for low hemoglobin and has received blood transfusion This morning hemoglobin is 7.8   She was recently admitted to [facility] for hyperkalemia hypotension, rectal bleeding, left leg DVT - ivc filter placed. Prior to that, she was in the rehabilitation for a stroke. EGD done and [facility] shows grade D esophagitis prominent in the lower and middle 3rd of the esophagus with friable exudates and erosions. Colonoscopy on January 14 showed rectal ulcers  She underwent CT angiogram of the abdomen and pelvis which shows overall vascular findings most suggestive of pulmonary arterial hypertension and right-sided heart dysfunction consistent with cor pulmonale. 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  Tolerating PD. Overnight UF was 769 cc Appetite is poor  Objective:  Vital signs in last 24 hours:  Temp:  [98.2 F (36.8 C)-98.7 F (37.1 C)] 98.2 F (36.8 C) (02/26 0830) Pulse Rate:  [64-89] 77 (02/26 0830) Resp:  [16-22] 16 (02/26 0830) BP: (83-111)/(52-80) 106/70 mmHg (02/26 0830) SpO2:  [96 %-100 %] 98 % (02/26 0830) Weight:  [61.054 kg (134 lb 9.6 oz)-62.823 kg (138 lb 8 oz)] 61.054 kg (134 lb 9.6 oz) (02/26 0500)  Weight change: 5.08 kg (11 lb 3.2 oz) Filed Weights   02/09/16 0411 02/09/16 2110 02/10/16 0500  Weight: 57.743 kg (127 lb 4.8 oz) 62.823 kg (138 lb 8 oz) 61.054 kg (134 lb 9.6 oz)    Intake/Output:    Intake/Output Summary (Last 24 hours) at 02/10/16 1058 Last data filed at 02/10/16 1046  Gross per 24 hour  Intake    732 ml  Output    769 ml  Net    -37 ml     Physical Exam: General: Thin, frail  HEENT Pale conjunctiva, moist oral mucous membranes  Neck supple  Pulm/lungs Lungs are clear to auscultation bilaterally  CVS/Heart Regular, prominent murmur, S3 gallop  Abdomen:  Soft, nontender,  Extremities: Right leg  edema, no edema on left leg   Neurologic: Right hemiparesis, right arm weakness  Skin: No acute rashes  Access: PD catheter, site nontender       Basic Metabolic Panel:   Recent Labs Lab 02/06/16 1856 02/07/16 0717  NA 133* 134*  K 4.9 5.1  CL 98* 100*  CO2 18* 18*  GLUCOSE 88 103*  BUN 92* 95*  CREATININE 7.97* 8.09*  CALCIUM 8.2* 8.0*     CBC:  Recent Labs Lab 02/06/16 1856 02/07/16 0717 02/07/16 1315 02/07/16 1933 02/08/16 0246 02/08/16 1249 02/10/16 0528  WBC 8.1 9.4  --   --   --   --   --   HGB 4.5* 7.0* 7.1* 7.3* 8.6* 8.1* 7.8*  HCT 14.1* 21.4*  --   --  25.1*  --   --   MCV 91.9 88.6  --   --   --   --   --   PLT 194 163  --   --   --   --   --       Microbiology:  Recent Results (from the past 720 hour(s))  MRSA PCR Screening     Status: None   Collection Time: 02/06/16 11:22 PM  Result Value Ref Range Status   MRSA by PCR NEGATIVE NEGATIVE Final    Comment:        The GeneXpert MRSA Assay (FDA approved for NASAL specimens only), is one component of a comprehensive  MRSA colonization surveillance program. It is not intended to diagnose MRSA infection nor to guide or monitor treatment for MRSA infections.     Coagulation Studies: No results for input(s): LABPROT, INR in the last 72 hours.  Urinalysis: No results for input(s): COLORURINE, LABSPEC, PHURINE, GLUCOSEU, HGBUR, BILIRUBINUR, KETONESUR, PROTEINUR, UROBILINOGEN, NITRITE, LEUKOCYTESUR in the last 72 hours.  Invalid input(s): APPERANCEUR    Imaging: No results found.   Medications:     . sodium chloride   Intravenous Once  . atorvastatin  80 mg Oral q1800  . dialysis solution 1.5% low-MG/low-CA   Intraperitoneal Q24H  . gentamicin ointment   Topical Daily  . insulin aspart  0-9 Units Subcutaneous 6 times per day  . pantoprazole  40 mg Oral BID  . sodium chloride flush  3 mL Intravenous Q12H  . sucralfate  1 g Oral 4 times per day   bisacodyl, heparin,  ipratropium-albuterol, morphine injection, ondansetron **OR** ondansetron (ZOFRAN) IV, phenol-menthol  Assessment/ Plan:  55 y.o. 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 Atrophic renal arteries bilaterally with associated atrophy of the native kidneys., Peripheral arterial disease with high-grade stenosis versus short segment occlusion of the left common iliac artery, moderate to high-grade stenosis of the proximal right common iliac artery, high-grade stenosis of the origin of the left internal iliac artery, and mild to moderate superficial femoral artery disease (worse on the left than the right) , Large layering right pleural effusion with associated right lower lobe atelectasis. renal osteodystrophy.   1.  End-stage renal disease with pleural effusion  - Will continue peritoneal dialysis while she is in the hospital - Total UF 769 cc with 1.5% bags - will use 1.5% and 2.5% bags combination tonight  2. Anemia of GI bleed (congestive gastropathy? ) and chronic Kidney Disease - patient received blood transfusion - Will avoid Procrit for now because of recent DVT and stroke - sucralfate   3.  Secondary hyperparathyroidism -  Monitor Phos while in the hospital - avoid fleets enema in dialysis patient - Prefer Soap suds enema if necessary   ,   LOS:  4 Dorothy Long 2/26/201710:58 AM

## 2016-02-10 NOTE — Progress Notes (Signed)
[facility] - [place] at [facility]   PATIENT NAME: Dorothy Long    MR#:  [id number]  DATE OF BIRTH:  1961/06/08  SUBJECTIVE:   No acute bleeding overnight. Hg. Stable at 7.8 today.  Tolerating regular diet well. Abdominal pain, nausea, vomiting or hematemesis or hematochezia. Physical Therapy evaluation noted.  REVIEW OF SYSTEMS:    Review of Systems  Constitutional: Negative for fever and chills.  HENT: Negative for congestion and tinnitus.   Eyes: Negative for blurred vision and double vision.  Respiratory: Negative for cough, shortness of breath and wheezing.   Cardiovascular: Negative for chest pain, orthopnea and PND.  Gastrointestinal: Negative for nausea, vomiting, abdominal pain and diarrhea.  Genitourinary: Negative for dysuria and hematuria.  Neurological: Positive for weakness (generalized). Negative for dizziness, sensory change and focal weakness.  All other systems reviewed and are negative.   Nutrition: Full liquid Tolerating Diet: Yes Tolerating PT: Eval noted.   DRUG ALLERGIES:   Allergies  Allergen Reactions  . Ciprocinonide [Fluocinolone] Hives  . Demerol [Meperidine] Nausea And Vomiting  . Diflucan [Fluconazole] Nausea Only  . Flagyl [Metronidazole] Hives and Itching  . Hydrocodone Itching  . Levaquin [Levofloxacin In D5w] Other (See Comments)    Numbness in legs  . Morphine And Related Nausea And Vomiting  . Tetracyclines & Related Itching and Swelling  . Valium [Diazepam] Nausea Only    hallucinations    VITALS:  Blood pressure 109/76, pulse 86, temperature 98.5 F (36.9 C), temperature source Oral, resp. rate 16, height  (1.778 m), weight 61.054 kg (134 lb 9.6 oz), SpO2 95 %.  PHYSICAL EXAMINATION:   Physical Exam  GENERAL:  55 y.o.-year-old patient lying in the bed with no acute distress.  EYES: Pupils equal, round, reactive to light and accommodation. No scleral icterus. Extraocular muscles intact.   HEENT: Head atraumatic, normocephalic. Oropharynx and nasopharynx clear.  NECK:  Supple, no jugular venous distention. No thyroid enlargement, no tenderness.  LUNGS: Normal breath sounds bilaterally, no wheezing, rales, rhonchi. No use of accessory muscles of respiration.  CARDIOVASCULAR: S1, S2 normal. II-III/VI Diastolic murmur at the base, No rubs, or gallops.  ABDOMEN: Soft, nontender, nondistended. Bowel sounds present. No organomegaly or mass. + PD cath in place.   EXTREMITIES: No cyanosis, clubbing, + 1 edema on RUE compared to left.  No LE edema b/l.  NEUROLOGIC: Cranial nerves II through XII are intact. No focal Motor or sensory deficits b/l. Globally weak. Dense right sided weakness from previous CVA.    PSYCHIATRIC: The patient is alert and oriented x 3.  SKIN: No obvious rash, lesion, or ulcer.    LABORATORY PANEL:   CBC  Recent Labs Lab 02/07/16 0717  02/08/16 0246  02/10/16 0528  WBC 9.4  --   --   --   --   HGB 7.0*  < > 8.6*  < > 7.8*  HCT 21.4*  --  25.1*  --   --   PLT 163  --   --   --   --   < > = values in this interval not displayed. ------------------------------------------------------------------------------------------------------------------  Chemistries   Recent Labs Lab 02/06/16 1856 02/07/16 0717  NA 133* 134*  K 4.9 5.1  CL 98* 100*  CO2 18* 18*  GLUCOSE 88 103*  BUN 92* 95*  CREATININE 7.97* 8.09*  CALCIUM 8.2* 8.0*  AST 23  --   ALT 11*  --   ALKPHOS 91  --   BILITOT 0.8  --    ------------------------------------------------------------------------------------------------------------------  Cardiac Enzymes  Recent Labs Lab 02/06/16 1856  TROPONINI 0.35*   ------------------------------------------------------------------------------------------------------------------  RADIOLOGY:  No results found.   ASSESSMENT AND PLAN:   55 year old female with past medical history of end-stage renal disease on peritoneal dialysis,  history of previous CVA, diabetes, COPD, anemia of chronic disease, paroxysmal atrial Fibrillation, chronic systolic and diastolic CHF, who presented to the hospital to 2 weakness, altered mental status and noted to be anemic.  #1 acute posthemorrhagic anemia-this was secondary to GI bleed. Patient has been transfused a total of 2 units of packed red blood cells since being admitted to the hospital and hemoglobin currently stable to 7.8. - follow Hg.   #2 GI bleed-this is secondary to peptic ulcer disease and also esophagitis, gastritis and also portal gastropathy given her Liver cirrhosis and pulmonary hypertension. -Patient was seen by gastroenterology and they have no plans for doing any acute endoscopic intervention presently. Her hemoglobin is currently stable. She has no abdominal pain, nausea vomiting or hematemesis. -She is tolerating a regular diet well now.  - cont. PPI BID w/ Sucralfate.    #3 end-stage renal disease on hemodialysis- pt. Is on PD and cont. Care as per Nephrology.  - no acute issue  #4 GERD - cont. Protonix.   #5 diabetes type 2 without complication-continue sliding scale insulin for now.  #6 history of recent CVA-aspirin is on hold due to GI bleed. Continue Lipitor. - pt. Was seen by PT and they recommend acute inpatient rehab but pt. Got discharged from there recently.  Will likely need SNF and have PT reeval pt. Today.   #7 COPD-no acute exacerbation -continue duo nebs as needed.  Social Work consult for Placement.   All the records are reviewed and case discussed with Care Management/Social Workerr. Management plans discussed with the patient, family and they are in agreement.  CODE STATUS: DO NOT RESUSCITATE  DVT Prophylaxis: Teds and SCDs  TOTAL TIME TAKING CARE OF THIS PATIENT: 30 minutes.   POSSIBLE D/C IN 1-2 DAYS, DEPENDING ON CLINICAL CONDITION.   Houston Siren M.D on 02/10/2016 at 2:05 PM  Between 7am to 6pm - Pager -  [phone]  After 6pm go to [email or web address] - password [email or web address] [facility]  [place]  [facility]  Office  [phone]  CC: Primary care physician; Sherlene Shams, MD

## 2016-02-11 LAB — GLUCOSE, CAPILLARY
GLUCOSE-CAPILLARY: 98 mg/dL (ref 65–99)
GLUCOSE-CAPILLARY: 88 mg/dL (ref 65–99)
Glucose-Capillary: 100 mg/dL — ABNORMAL HIGH (ref 65–99)
Glucose-Capillary: 112 mg/dL — ABNORMAL HIGH (ref 65–99)
Glucose-Capillary: 91 mg/dL (ref 65–99)
Glucose-Capillary: 93 mg/dL (ref 65–99)

## 2016-02-11 LAB — PREPARE RBC (CROSSMATCH)

## 2016-02-11 LAB — HEMOGLOBIN: Hemoglobin: 8.6 g/dL — ABNORMAL LOW (ref 12.0–16.0)

## 2016-02-11 NOTE — Care Management (Addendum)
I spoke with Dorothy Long at [facility] inpatient rehab and patient just left there a few weeks ago. Dorothy Long agrees to review patient for inpatient rehab however, they have no beds available at this time. Dorothy Long's contact is Rehab Intake Office: [phone]. Fax [phone]. I will update spouse to see if interested in CIR. I spoke with husband Dorothy Long [phone] updating on [facility] inpatient rehab. He agrees to CIR however, patient may not have any benefit days left. I have requested CIR to review. CSW spoke with him about SNF (which may be from the same bank) but she may not have benefit for that either. He states she is currently open to [facility] care as arranged post [facility] inpatient rehab. He states she can ride in a car with him. He works 2nd shift but has family rotating in to assist her. He is on peritoneal dialysis also. Dorothy Long is prepared to take patient home. I have left a message for Dorothy Long at [facility] rehab to call me back to see if and when they could take patient. Barb boyette at CIR called back and will review case.  RNCM will continue to follow. 02/11/16 at 1449: received callback from [facility] declining patient.

## 2016-02-11 NOTE — Progress Notes (Signed)
Contacted by RN CM to consider pt for admission to inpt rehab at [facility] rehab in [place] for [facility] does not have a bed today. Pt's primary insurance is [employer] and Medicare secondary. Any rehab admission would require [facility] approval. I spoke with pt's spouse, Dewight by phone to discuss his preference. His preference as before is [facility] for he works in [place] and she was just discharged 2/9 form their inpt rehab after her CVA. For continuity that would be his preference. He does not feel that she is functionally ready to d/c home at her current level. I will need an OT eval to pursue [employer] approval for an inpt hospital rehab admission. Pt is on peritoneal dialysis and I am not aware of any SNF in [place] that admits pts with peritoneal dialysis at this time. I await OT eval and then I will pursue CIR/Inpt rehab admission at [facility] as back up plan to [facility] inpt rehab which is family preference. I have made RN CM aware. [phone]

## 2016-02-11 NOTE — Clinical Social Work Placement (Signed)
   CLINICAL SOCIAL WORK PLACEMENT  NOTE  Date:  02/11/2016  Patient Details  Name: Dorothy Long MRN: [id number] Date of Birth: May 20, 1961  Clinical Social Work is seeking post-discharge placement for this patient at the Skilled  Nursing Facility level of care (*CSW will initial, date and re-position this form in  chart as items are completed):  Yes   Patient/family provided with [facility] Clinical Social Work Department's list of facilities offering this level of care within the geographic area requested by the patient (or if unable, by the patient's family).  Yes   Patient/family informed of their freedom to choose among providers that offer the needed level of care, that participate in Medicare, Medicaid or managed care program needed by the patient, have an available bed and are willing to accept the patient.  Yes   Patient/family informed of [facility]'s ownership interest in [facility] and [facility], as well as of the fact that they are under no obligation to receive care at these facilities.  PASRR submitted to EDS on 02/11/16     PASRR number received on 02/11/16     Existing PASRR number confirmed on       FL2 transmitted to all facilities in geographic area requested by pt/family on 02/11/16     FL2 transmitted to all facilities within larger geographic area on       Patient informed that his/her managed care company has contracts with or will negotiate with certain facilities, including the following:            Patient/family informed of bed offers received.  Patient chooses bed at       Physician recommends and patient chooses bed at      Patient to be transferred to   on  .  Patient to be transferred to facility by       Patient family notified on   of transfer.  Name of family member notified:        PHYSICIAN       Additional Comment:    _______________________________________________ Dede Query, LCSW 02/11/2016, 4:35 PM

## 2016-02-11 NOTE — Progress Notes (Signed)
[facility] Physicians - [place] at [facility]   PATIENT NAME: Dorothy Long    MR#:  [id number]  DATE OF BIRTH:  1961-04-02  SUBJECTIVE:   No acute bleeding overnight. Hg. Stable at 8.6.  Tolerating regular diet well. No Abdominal pain, nausea, vomiting or hematemesis or hematochezia.   REVIEW OF SYSTEMS:    Review of Systems  Constitutional: Negative for fever and chills.  HENT: Negative for congestion and tinnitus.   Eyes: Negative for blurred vision and double vision.  Respiratory: Negative for cough, shortness of breath and wheezing.   Cardiovascular: Negative for chest pain, orthopnea and PND.  Gastrointestinal: Negative for nausea, vomiting, abdominal pain and diarrhea.  Genitourinary: Negative for dysuria and hematuria.  Neurological: Positive for weakness (generalized). Negative for dizziness, sensory change and focal weakness.  All other systems reviewed and are negative.   Nutrition: Full liquid Tolerating Diet: Yes Tolerating PT: Eval noted.   DRUG ALLERGIES:   Allergies  Allergen Reactions  . Ciprocinonide [Fluocinolone] Hives  . Demerol [Meperidine] Nausea And Vomiting  . Diflucan [Fluconazole] Nausea Only  . Flagyl [Metronidazole] Hives and Itching  . Hydrocodone Itching  . Levaquin [Levofloxacin In D5w] Other (See Comments)    Numbness in legs  . Morphine And Related Nausea And Vomiting  . Tetracyclines & Related Itching and Swelling  . Valium [Diazepam] Nausea Only    hallucinations    VITALS:  Blood pressure 107/77, pulse 85, temperature 98.7 F (37.1 C), temperature source Oral, resp. rate 16, height  (1.778 m), weight 58.378 kg (128 lb 11.2 oz), SpO2 98 %.  PHYSICAL EXAMINATION:   Physical Exam  GENERAL:  55 y.o.-year-old patient lying in the bed with no acute distress.  EYES: Pupils equal, round, reactive to light and accommodation. No scleral icterus. Extraocular muscles intact.  HEENT: Head atraumatic,  normocephalic. Oropharynx and nasopharynx clear.  NECK:  Supple, no jugular venous distention. No thyroid enlargement, no tenderness.  LUNGS: Normal breath sounds bilaterally, no wheezing, rales, rhonchi. No use of accessory muscles of respiration.  CARDIOVASCULAR: S1, S2 normal. II-III/VI Diastolic murmur at the base, No rubs, or gallops.  ABDOMEN: Soft, nontender, nondistended. Bowel sounds present. No organomegaly or mass. + PD cath in place.   EXTREMITIES: No cyanosis, clubbing, + 1 edema on RUE compared to left.  No LE edema b/l.  NEUROLOGIC: Cranial nerves II through XII are intact. No focal Motor or sensory deficits b/l. Globally weak. Dense right sided weakness from previous CVA.    PSYCHIATRIC: The patient is alert and oriented x 3.  SKIN: No obvious rash, lesion, or ulcer.    LABORATORY PANEL:   CBC  Recent Labs Lab 02/07/16 0717  02/08/16 0246  02/11/16 1020  WBC 9.4  --   --   --   --   HGB 7.0*  < > 8.6*  < > 8.6*  HCT 21.4*  --  25.1*  --   --   PLT 163  --   --   --   --   < > = values in this interval not displayed. ------------------------------------------------------------------------------------------------------------------  Chemistries   Recent Labs Lab 02/06/16 1856 02/07/16 0717  NA 133* 134*  K 4.9 5.1  CL 98* 100*  CO2 18* 18*  GLUCOSE 88 103*  BUN 92* 95*  CREATININE 7.97* 8.09*  CALCIUM 8.2* 8.0*  AST 23  --   ALT 11*  --   ALKPHOS 91  --   BILITOT 0.8  --    ------------------------------------------------------------------------------------------------------------------  Cardiac Enzymes  Recent Labs Lab 02/06/16 1856  TROPONINI 0.35*   ------------------------------------------------------------------------------------------------------------------  RADIOLOGY:  No results found.   ASSESSMENT AND PLAN:   55 year old female with past medical history of end-stage renal disease on peritoneal dialysis, history of previous CVA,  diabetes, COPD, anemia of chronic disease, paroxysmal atrial Fibrillation, chronic systolic and diastolic CHF, who presented to the hospital to 2 weakness, altered mental status and noted to be anemic.  #1 acute posthemorrhagic anemia-this was secondary to GI bleed. Patient has been transfused a total of 2 units of packed red blood cells since being admitted to the hospital and hemoglobin currently stable to 8.6. - follow Hg.   #2 GI bleed-this is secondary to peptic ulcer disease and also esophagitis, gastritis and also portal gastropathy given her Liver cirrhosis and pulmonary hypertension. -Patient was seen by gastroenterology and they have no plans for doing any acute endoscopic intervention presently. Her hemoglobin is currently stable. She has no abdominal pain, nausea vomiting or hematemesis. -She is tolerating a regular diet well now.  - cont. PPI BID w/ Sucralfate.    #3 end-stage renal disease on hemodialysis- pt. Is on PD and cont. Care as per Nephrology.  - no acute issue  #4 GERD - cont. Protonix.   #5 diabetes type 2 without complication-continue sliding scale insulin for now. - BS stable.   #6 history of recent CVA- Continue Lipitor. And will hold off on ASA given GI bleed and probably initiate in the next 1-2 weeks.  - pt. Was seen by PT and they recommend acute inpatient rehab but pt. Got discharged from there recently.   #7 COPD-no acute exacerbation -continue duo nebs as needed.  Await  Social Work input regarding dispo.  ?? SNF (vs) home with Home health  All the records are reviewed and case discussed with Care Management/Social Workerr. Management plans discussed with the patient, family and they are in agreement.  CODE STATUS: DO NOT RESUSCITATE  DVT Prophylaxis: Teds and SCDs  TOTAL TIME TAKING CARE OF THIS PATIENT: 30 minutes.   POSSIBLE D/C IN 1-2 DAYS, DEPENDING ON CLINICAL CONDITION.   Houston Siren M.D on 02/11/2016 at 1:49 PM  Between 7am to 6pm  - Pager - [phone]  After 6pm go to [email or web address] - password [email or web address] [facility]  [place] [facility]  Office  [phone]  CC: Primary care physician; Sherlene Shams, MD

## 2016-02-11 NOTE — Progress Notes (Signed)
Subjective:  Patient seen at bedside.  Doing better today. PD continues to progress well. Case discussed with Dr. Cherlynn Kaiser.  Objective:  Vital signs in last 24 hours:  Temp:  [98.5 F (36.9 C)-98.8 F (37.1 C)] 98.8 F (37.1 C) (02/27 0534) Pulse Rate:  [84-86] 86 (02/27 0534) Resp:  [16-18] 18 (02/27 0534) BP: (103-113)/(75-76) 113/75 mmHg (02/27 0534) SpO2:  [95 %-98 %] 98 % (02/27 0534) Weight:  [58.378 kg (128 lb 11.2 oz)] 58.378 kg (128 lb 11.2 oz) (02/27 0534)  Weight change: -4.445 kg (-9 lb 12.8 oz) Filed Weights   02/09/16 2110 02/10/16 0500 02/11/16 0534  Weight: 62.823 kg (138 lb 8 oz) 61.054 kg (134 lb 9.6 oz) 58.378 kg (128 lb 11.2 oz)    Intake/Output:    Intake/Output Summary (Last 24 hours) at 02/11/16 0955 Last data filed at 02/10/16 1046  Gross per 24 hour  Intake      0 ml  Output    769 ml  Net   -769 ml     Physical Exam: General: Thin, frail  HEENT Pale conjunctiva, moist oral mucous membranes  Neck supple  Pulm/lungs Lungs are clear to auscultation bilaterally  CVS/Heart Regular, prominent murmur, S3 gallop  Abdomen:  Soft, nontender, BS present  Extremities: Right leg edema, no edema on left leg   Neurologic: Right hemiparesis, right arm weakness  Skin: No acute rashes  Access: PD catheter       Basic Metabolic Panel:   Recent Labs Lab 02/06/16 1856 02/07/16 0717  NA 133* 134*  K 4.9 5.1  CL 98* 100*  CO2 18* 18*  GLUCOSE 88 103*  BUN 92* 95*  CREATININE 7.97* 8.09*  CALCIUM 8.2* 8.0*     CBC:  Recent Labs Lab 02/06/16 1856 02/07/16 0717 02/07/16 1315 02/07/16 1933 02/08/16 0246 02/08/16 1249 02/10/16 0528  WBC 8.1 9.4  --   --   --   --   --   HGB 4.5* 7.0* 7.1* 7.3* 8.6* 8.1* 7.8*  HCT 14.1* 21.4*  --   --  25.1*  --   --   MCV 91.9 88.6  --   --   --   --   --   PLT 194 163  --   --   --   --   --       Microbiology:  Recent Results (from the past 720 hour(s))  MRSA PCR Screening     Status: None   Collection Time: 02/06/16 11:22 PM  Result Value Ref Range Status   MRSA by PCR NEGATIVE NEGATIVE Final    Comment:        The GeneXpert MRSA Assay (FDA approved for NASAL specimens only), is one component of a comprehensive MRSA colonization surveillance program. It is not intended to diagnose MRSA infection nor to guide or monitor treatment for MRSA infections.     Coagulation Studies: No results for input(s): LABPROT, INR in the last 72 hours.  Urinalysis: No results for input(s): COLORURINE, LABSPEC, PHURINE, GLUCOSEU, HGBUR, BILIRUBINUR, KETONESUR, PROTEINUR, UROBILINOGEN, NITRITE, LEUKOCYTESUR in the last 72 hours.  Invalid input(s): APPERANCEUR    Imaging: No results found.   Medications:     . sodium chloride   Intravenous Once  . atorvastatin  80 mg Oral q1800  . dialysis solution 1.5% low-MG/low-CA   Intraperitoneal Q24H  . feeding supplement (NEPRO CARB STEADY)  237 mL Oral BID BM  . gentamicin ointment   Topical Daily  . insulin  aspart  0-9 Units Subcutaneous 6 times per day  . pantoprazole  40 mg Oral BID  . sodium chloride flush  3 mL Intravenous Q12H  . sucralfate  1 g Oral 4 times per day   alum & mag hydroxide-simeth, bisacodyl, heparin, ipratropium-albuterol, morphine injection, ondansetron **OR** ondansetron (ZOFRAN) IV, phenol-menthol  Assessment/ Plan:  55 y.o. female with history of end-stage renal disease (FSGS) currently on PD, paroxysmal atrial fibrillation, COPD, cirrhosis, stroke with left-sided weakness 2017,severe mitral stenosis,depression, thrombocytopenia, and negative anticardiolipin antibody, weakly positive ANA, acute GI bleed in January 2017 caused by severe esophagitis and bleeding rectal ulcer, IVC filter,  pulmonary arterial hypertensionm, right-sided heart dysfunction consistent with cor pulmonale, significant venous engorgement of the suprahepatic IVC and hepatic veins suspicious for congestive hepatopathy and cardiac cirrhosis.,   Critical stenosis versus short focal occlusion of the origin of the celiac artery. Atrophic renal arteries bilaterally with associated atrophy of the native kidneys., Peripheral arterial disease with high-grade stenosis versus short segment occlusion of the left common iliac artery, moderate to high-grade stenosis of the proximal right common iliac artery, high-grade stenosis of the origin of the left internal iliac artery, and mild to moderate superficial femoral artery disease (worse on the left than the right) , Large layering right pleural effusion with associated right lower lobe atelectasis. renal osteodystrophy.   1.  End-stage renal disease with pleural effusion  - Doing well with PD at the moment, will continue PD with 1.5%/2.5% dextrose solution.   2. Anemia of GI bleed (congestive gastropathy? ) and chronic Kidney Disease - stat hgb to be checked today  3.  Secondary hyperparathyroidism - check phos tomorrow if still here.     LOS: 5 Khia Dieterich 2/27/20179:55 AM

## 2016-02-11 NOTE — Progress Notes (Signed)
Rehab Admissions Coordinator Note:  Patient was screened by Clois Dupes for appropriateness for an Inpatient Acute Rehab Consult per PT recommendation.   Noted pt recently discharged from [facility] rehab 2/9 and spouse requesting Pt to be readmitted to [facility] inpt rehab.   Clois Dupes 02/11/2016, 8:03 AM  I can be reached at [phone].

## 2016-02-11 NOTE — Consult Note (Signed)
Patient has not had any further bleeding and wants more to eat.  Her hgb today was 8.6.  I think when she goes home she should be on chronic PPI bid and carafate bid between meals.  Chest exam shows somewhat decreased breath sounds on the right anterior field, probably due to pleural fluid.  Discussed her condition with husband and the reasons why a repeat EGD and colonoscopy are not needed unless she shows further active bleeding.

## 2016-02-11 NOTE — NC FL2 (Signed)
[place] MEDICAID FL2 LEVEL OF CARE SCREENING TOOL     IDENTIFICATION  Patient Name: Dorothy Long Birthdate: 01-11-1961 Sex: female Admission Date (Current Location): 02/06/2016  [place] and [place] Number:  Chiropodist and Address:  [facility], [place], [address], [place], [place] [address]      Provider Number: [id number]  Attending Physician Name and Address:  Houston Siren, MD  Relative Name and Phone Number:       Current Level of Care: Hospital Recommended Level of Care:   Prior Approval Number:    Date Approved/Denied:   PASRR Number: [id number]  Discharge Plan: SNF    Current Diagnoses: Patient Active Problem List   Diagnosis Date Noted  . Upper GI bleed 02/06/2016  . Aphasia   . Confusion   . Elevated troponin   . Mitral valve stenosis   . Congestive dilated cardiomyopathy (HCC)   . Carotid stenosis   . Dysarthria 12/11/2015  . ESRD (end stage renal disease) on dialysis (HCC) 12/11/2015  . TIA (transient ischemic attack) 12/11/2015  . Acute CVA (cerebrovascular accident) (HCC)   . NSTEMI (non-ST elevated myocardial infarction) (HCC)   . Ethmoid sinusitis 11/22/2015  . Syncope 11/20/2015  . Carotid artery narrowing   . Cerebral infarction due to unspecified mechanism   . CVA (cerebral infarction) 11/17/2015  . Nosebleed 11/13/2015  . Chronic combined systolic and diastolic CHF (congestive heart failure) (HCC)   . Hospital discharge follow-up 06/28/2015  . Diarrhea 06/22/2015  . Fatty liver 06/22/2015  . Nausea & vomiting 06/22/2015  . SBP (spontaneous bacterial peritonitis) (HCC) 06/22/2015  . Chest pain 06/15/2015  . TMJ (sprain of temporomandibular joint) 05/21/2015  . Postmenopausal atrophic vaginitis 06/19/2014  . Allergic rhinitis 05/29/2014  . Bilateral leg edema 05/15/2014  . Awaiting organ transplant 12/23/2013  . Hyperparathyroidism due to renal insufficiency (HCC) 12/23/2013  . Excess fluid  volume 09/27/2013  . Paroxysmal atrial fibrillation (HCC) 09/25/2013  . GERD (gastroesophageal reflux disease) 08/12/2013  . Focal and segmental hyalinosis 08/08/2013  . Mitral stenosis with incompetence or regurgitation 07/19/2013  . Anemia in chronic kidney disease 07/19/2013  . Chronic kidney disease with peritoneal dialysis as preferred modality, stage 5 (HCC) 07/14/2013  . Tobacco abuse counseling 12/20/2012  . Vitamin D deficiency 07/22/2012  . Hyperlipidemia   . Hypertension 07/21/2012  . Family history of colon cancer 07/21/2012  . History of tobacco abuse 07/21/2012    Orientation RESPIRATION BLADDER Height & Weight     Self, Time, Place, Situation  Normal Incontinent Weight: 128 lb 11.2 oz (58.378 kg) Height:   (177.8 cm)  BEHAVIORAL SYMPTOMS/MOOD NEUROLOGICAL BOWEL NUTRITION STATUS      Incontinent Diet (Renal Diet, 1200 mL Fluid restriction, Thin Liquids)  AMBULATORY STATUS COMMUNICATION OF NEEDS Skin   Extensive Assist Verbally Normal                       Personal Care Assistance Level of Assistance  Bathing, Feeding, Dressing Bathing Assistance: Maximum assistance Feeding assistance: Independent Dressing Assistance: Maximum assistance     Functional Limitations Info  Sight, Hearing, Speech Sight Info: Adequate Hearing Info: Adequate Speech Info: Adequate    SPECIAL CARE FACTORS FREQUENCY  PT (By licensed PT), OT (By licensed OT)                    Contractures      Additional Factors Info  Code Status, Allergies, Insulin Sliding Scale  Code Status Info: DNR Allergies Info: Allergies: Ciprocinonide, Demerol, Diflucan, Flagyl, Hydrocodone, Levaquin, Morphine And Related, Tetracyclines & Related, Valium   Insulin Sliding Scale Info: 6x/day       Current Medications (02/11/2016):  This is the current hospital active medication list Current Facility-Administered Medications  Medication Dose Route Frequency Provider Last Rate Last Dose   . 0.9 %  sodium chloride infusion   Intravenous Once Katharina Caper, MD   Stopped at 02/07/16 2017  . alum & mag hydroxide-simeth (MAALOX/MYLANTA) 200-200-20 MG/5ML suspension 30 mL  30 mL Oral Q6H PRN Houston Siren, MD   30 mL at 02/10/16 1538  . atorvastatin (LIPITOR) tablet 80 mg  80 mg Oral q1800 Katharina Caper, MD   80 mg at 02/10/16 2042  . bisacodyl (DULCOLAX) suppository 10 mg  10 mg Rectal Daily PRN Milagros Loll, MD      . dialysis solution 1.5% low-MG/low-CA dianeal solution   Intraperitoneal Q24H Mosetta Pigeon, MD   6,000 mL at 02/10/16 2253  . feeding supplement (NEPRO CARB STEADY) liquid 237 mL  237 mL Oral BID BM Harmeet Singh, MD   237 mL at 02/11/16 1400  . gentamicin ointment (GARAMYCIN) 0.1 %   Topical Daily Harmeet Singh, MD      . heparin 1000 unit/ml injection 500 Units  500 Units Intraperitoneal PRN Harmeet Singh, MD      . insulin aspart (novoLOG) injection 0-9 Units  0-9 Units Subcutaneous 6 times per day Milagros Loll, MD   0 Units at 02/07/16 0010  . ipratropium-albuterol (DUONEB) 0.5-2.5 (3) MG/3ML nebulizer solution 3 mL  3 mL Nebulization Q6H PRN Srikar Sudini, MD      . morphine 2 MG/ML injection 2 mg  2 mg Intravenous Q4H PRN Milagros Loll, MD   2 mg at 02/10/16 1610  . ondansetron (ZOFRAN) tablet 4 mg  4 mg Oral Q6H PRN Milagros Loll, MD       Or  . ondansetron (ZOFRAN) injection 4 mg  4 mg Intravenous Q6H PRN Milagros Loll, MD   4 mg at 02/07/16 0738  . pantoprazole (PROTONIX) EC tablet 40 mg  40 mg Oral BID Houston Siren, MD   40 mg at 02/11/16 1041  . phenol-menthol (CEPASTAT) lozenge 1 lozenge  1 lozenge Buccal PRN Katharina Caper, MD      . sodium chloride flush (NS) 0.9 % injection 3 mL  3 mL Intravenous Q12H Srikar Sudini, MD   3 mL at 02/11/16 1042  . sucralfate (CARAFATE) 1 GM/10ML suspension 1 g  1 g Oral 4 times per day Scot Jun, MD   1 g at 02/11/16 1258     Discharge Medications: Please see discharge summary for a list of discharge  medications.  Relevant Imaging Results:  Relevant Lab Results:   Additional Information SSN:  [id number]; Pt is on PD each night.   Dede Query, LCSW

## 2016-02-11 NOTE — Progress Notes (Signed)
PT Cancellation Note  Patient Details Name: Dorothy Long MRN: [id number] DOB: 08-23-1961   Cancelled Treatment:    Reason Eval/Treat Not Completed: Patient declined due to not feeling well and being too tired.  Pt's husband reports that prior to admission, pt mostly utilized a WC.  Pt would use RW with assistance to ambulate short distances and required assistance with transfers.     Lyndel Safe, SPT Lyndel Safe 02/11/2016, 2:06 PM

## 2016-02-11 NOTE — Care Management Important Message (Signed)
Important Message  Patient Details  Name: Dorothy Long MRN: [id number] Date of Birth: 01-May-1961   Medicare Important Message Given:  Yes    [name] 02/11/2016, 1:43 PM

## 2016-02-11 NOTE — Clinical Social Work Note (Signed)
Clinical Social Work Assessment  Patient Details  Name: Dorothy Long MRN: [id number] Date of Birth: 03-03-1961  Date of referral:  02/10/16               Reason for consult:  Facility Placement                Permission sought to share information with:   Family  Permission granted to share information::  Yes, Verbal Permission Granted  Name::     Zeyna Mkrtchyan, husband   Housing/Transportation Living arrangements for the past 2 months:  [place] of Information:  Patient, Spouse Patient Interpreter Needed:  None Criminal Activity/Legal Involvement Pertinent to Current Situation/Hospitalization:   None Significant Relationships:  Spouse Lives with:  Spouse Do you feel safe going back to the place where you live?  Yes Need for family participation in patient care:  Yes (Comment)  Care giving concerns:  No care giving concerns identified.   Social Worker assessment / plan:  CSW met with pt and husband to address consult. CSW introduced herself and explained role of social work. RNCM was present as she was explaining CIR. PT is recommending CIR, however SNF will be a back up plan. CSW explained STR at SNF. Pt has [place] and a prior [facility] is needed. CSW intiaited  a SNF search and will follow up with bed offers. CSW will continue to follow.   Employment status:  Disabled (Comment on whether or not currently receiving Disability) Insurance information:  Medicare, Programmer, applications PT Recommendations:  Inpatient Rehab Consult Information / Referral to community resources:  [place]  Patient/Family's Response to care:  Pt's husband was appreciative  Patient/Family's Understanding of and Emotional Response to Diagnosis, Current Treatment, and Prognosis:  Pt's husband understands that pt would benefit from CIR, however has care at home if needed.   Emotional Assessment Appearance:  Appears stated age Attitude/Demeanor/Rapport:   (Appropriate) Affect  (typically observed):  Flat Orientation:  Oriented to Self, Oriented to Place, Oriented to  Time, Oriented to Situation Alcohol / Substance use:  Never Used Psych involvement (Current and /or in the community):  No (Comment)  Discharge Needs  Concerns to be addressed:  Adjustment to Illness Readmission within the last 30 days:  No Current discharge risk:  Chronically ill Barriers to Discharge:  Continued Medical Work up   [employer], LCSW 02/11/2016, 4:40 PM

## 2016-02-12 ENCOUNTER — Telehealth: Payer: Self-pay | Admitting: Internal Medicine

## 2016-02-12 LAB — GLUCOSE, CAPILLARY
Glucose-Capillary: 84 mg/dL (ref 65–99)
Glucose-Capillary: 99 mg/dL (ref 65–99)

## 2016-02-12 LAB — PHOSPHORUS: Phosphorus: 4.7 mg/dL — ABNORMAL HIGH (ref 2.5–4.6)

## 2016-02-12 MED ORDER — SUCRALFATE 1 GM/10ML PO SUSP: 1.0000 g | Freq: Four times a day (QID) | ORAL | Status: AC

## 2016-02-12 NOTE — Discharge Summary (Signed)
[facility] - [place] at [facility]   PATIENT NAME: Dorothy Long    MR#:  [id number]  DATE OF BIRTH:  February 06, 1961  DATE OF ADMISSION:  02/06/2016 ADMITTING PHYSICIAN: Milagros Loll, MD  DATE OF DISCHARGE: 02/12/2016 12:30 PM  PRIMARY CARE PHYSICIAN: Sherlene Shams, MD    ADMISSION DIAGNOSIS:  Epigastric abdominal pain [R10.13] Gastrointestinal hemorrhage associated with peptic ulcer [K27.4] Symptomatic anemia [D64.9]  DISCHARGE DIAGNOSIS:  Active Problems:   Upper GI bleed   SECONDARY DIAGNOSIS:   Past Medical History  Diagnosis Date  . Hypertension   . Hyperlipidemia   . COPD (chronic obstructive pulmonary disease) (HCC)     a. not on home O2  . Anemia   . Valvular disease     a. 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 echo 2015: EF 45-50%, borderline LVH, mod dilated LA, mildly dilated RA, small pericardial effusion, mod MR (rheumatic deformity), MS mild-mod, no gradient, PASP ele  . Chronic combined systolic and diastolic CHF (congestive heart failure) (HCC)     a. echo reports above  . PAF (paroxysmal atrial fibrillation) (HCC)     a. in setting of diarrhea 09/21/2013; b. not on long term full dose anticoagulation; c. CHADSVASC at least 4 (CHF, HTN, DM, female)  . Diabetes mellitus (HCC)   . Peritoneal dialysis status (HCC) 4- 15-15  . Ventral hernia   . Inguinal hernia   . History of stress test     a. 2014: no convincing evidence of pharmacologically induced ischemia,  apparent ant apical defect present only on attenuation corrected images favored to reflect artifact due to overcorrection & may be worse on stress imaging secondary to greater patient motion. A small area of ischemia is felt less likely but is difficult to entirely exclude, EF 55%  . ESRD (end stage  renal disease) on dialysis (HCC)     a. HD on T,T,S; b. 2/2 focal segmental glomerulosclerosis   . Dialysis patient (HCC)   . Renal insufficiency   . Stroke [facility]) 11/2015    a. MRI L cereblal infarctions in the MCA/PCA, ACA/MCA territory; b. felt to be carotid in etiology; c. medically managed on DAPT per neuro    HOSPITAL COURSE:   55 year old female with past medical history of end-stage renal disease on peritoneal dialysis, history of previous CVA, diabetes, COPD, anemia of chronic disease, paroxysmal atrial Fibrillation, chronic systolic and diastolic CHF, who presented to the hospital to 2 weakness, altered mental status and noted to be anemic.  #1 acute posthemorrhagic anemia-this was secondary to GI bleed. Patient was admitted to the hospital hemoglobin of 4.5. She has been transfused a total of 2 units of packed red blood cells and her hemoglobin is much stable and improved upon discharge. -She has not shown evidence of acute bleeding over the past 3-4 days.  -Hemoglobin prior to discharge is 8.6.  #2 GI bleed-this was secondary to peptic ulcer disease and also esophagitis, gastritis and also portal gastropathy given her Liver cirrhosis and pulmonary hypertension. -Patient was seen by gastroenterology and they did not plan on doing any acute endoscopic intervention given her comorbidities. She was taken off the aspirin. She was treated supportively with blood transfusions. Her hemoglobin is currently stable she has not shown any evidence of bleeding and is not tolerating a  regular diet. She is being discharged on Protonix twice daily and also Carafate and follow up with gastroenterology as an outpatient.   #3 end-stage renal disease on hemodialysis- Pt. Is on PD and was seen by Nephrology in the hospital and tolerated her PD and will cont. Follow up with them as outpatient.   #4 GERD - she will cont. Protonix.   #5 history of recent CVA-patient was recently discharged from acute  inpatient rehabilitation to home. Physical therapy saw her and recommended short-term rehabilitation but she has no insurance benefits from short-term rehabilitation presently. She is being discharged home with home health physical therapy, speech services. -She will continue her Lipitor, and resume aspirin next 1-2 days. Her aspirin was held in the hospital due to the GI bleed.  #7 COPD-no acute exacerbation -She will resume her albuterol inhaler and Spiriva as an outpatient.  DISCHARGE CONDITIONS:   Stable  CONSULTS OBTAINED:  Treatment Team:  Mosetta Pigeon, MD Scot Jun, MD Lamar Blinks, MD Houston Siren, MD Renford Dills, MD  DRUG ALLERGIES:   Allergies  Allergen Reactions  . Ciprocinonide [Fluocinolone] Hives  . Demerol [Meperidine] Nausea And Vomiting  . Diflucan [Fluconazole] Nausea Only  . Flagyl [Metronidazole] Hives and Itching  . Hydrocodone Itching  . Levaquin [Levofloxacin In D5w] Other (See Comments)    Numbness in legs  . Morphine And Related Nausea And Vomiting  . Tetracyclines & Related Itching and Swelling  . Valium [Diazepam] Nausea Only    hallucinations    DISCHARGE MEDICATIONS:   Discharge Medication List as of 02/12/2016  9:43 AM    START taking these medications   Details  sucralfate (CARAFATE) 1 GM/10ML suspension Take 10 mLs (1 g total) by mouth every 6 (six) hours., Starting 02/12/2016, Until Discontinued, Print      CONTINUE these medications which have NOT CHANGED   Details  acetaminophen (TYLENOL) 325 MG tablet Take 325 mg by mouth every 4 (four) hours as needed for mild pain. , Until Discontinued, Historical Med    albuterol (VENTOLIN HFA) 108 (90 BASE) MCG/ACT inhaler Inhale 1 puff into the lungs every 4 (four) hours as needed for wheezing or shortness of breath. , Until Discontinued, Historical Med    aspirin 81 MG chewable tablet Chew 81 mg by mouth daily., Until Discontinued, Historical Med    atorvastatin (LIPITOR)  80 MG tablet Take 1 tablet by mouth daily., Starting 01/24/2016, Until Discontinued, Historical Med    calcitRIOL (ROCALTROL) 0.5 MCG capsule Take 1 capsule by mouth daily., Starting 01/24/2016, Until Discontinued, Historical Med    Calcium Acetate 667 MG TABS Take 3 capsules by mouth 3 (three) times daily., Until Discontinued, Historical Med    cinacalcet (SENSIPAR) 30 MG tablet Take 30 mg by mouth daily., Until Discontinued, Historical Med    citalopram (CELEXA) 20 MG tablet Take 1 tablet by mouth daily., Starting 01/24/2016, Until Discontinued, Historical Med    docusate sodium (COLACE) 100 MG capsule Take 100 mg by mouth daily., Until Discontinued, Historical Med    epoetin alfa (EPOGEN,PROCRIT) 16109 UNIT/ML injection Inject 25,000 Units into the skin once a week., Until Discontinued, Historical Med    ondansetron (ZOFRAN-ODT) 4 MG disintegrating tablet Take 4 mg by mouth every 12 (twelve) hours as needed for nausea or vomiting., Until Discontinued, Historical Med    pantoprazole (PROTONIX) 40 MG tablet Take 1 tablet by mouth 2 (two) times daily., Starting 01/24/2016, Until Discontinued, Historical Med    sodium chloride 1 g  tablet Take 2 g by mouth 2 (two) times daily with a meal., Until Discontinued, Historical Med    tiotropium (SPIRIVA) 18 MCG inhalation capsule Place 1 capsule (18 mcg total) into inhaler and inhale daily., Starting 08/22/2015, Until Discontinued, Normal         DISCHARGE INSTRUCTIONS:   DIET:  Cardiac diet and Renal diet  DISCHARGE CONDITION:  Stable  ACTIVITY:  Activity as tolerated  OXYGEN:  Home Oxygen: No.   Oxygen Delivery: room air  DISCHARGE LOCATION:  Home with home health physical therapy, nursing and home health aide.   If you experience worsening of your admission symptoms, develop shortness of breath, life threatening emergency, suicidal or homicidal thoughts you must seek medical attention immediately by calling 911 or calling your MD  immediately  if symptoms less severe.  You Must read complete instructions/literature along with all the possible adverse reactions/side effects for all the Medicines you take and that have been prescribed to you. Take any new Medicines after you have completely understood and accpet all the possible adverse reactions/side effects.   Please note  You were cared for by a hospitalist during your hospital stay. If you have any questions about your discharge medications or the care you received while you were in the hospital after you are discharged, you can call the unit and asked to speak with the hospitalist on call if the hospitalist that took care of you is not available. Once you are discharged, your primary care physician will handle any further medical issues. Please note that NO REFILLS for any discharge medications will be authorized once you are discharged, as it is imperative that you return to your primary care physician (or establish a relationship with a primary care physician if you do not have one) for your aftercare needs so that they can reassess your need for medications and monitor your lab values.     Today   No abdominal pain, nausea, vomiting. Wants to go home.  VITAL SIGNS:  Blood pressure 110/78, pulse 80, temperature 98.1 F (36.7 C), temperature source Oral, resp. rate 18, height 5\' 10"  (1.778 m), weight 55.52 kg (122 lb 6.4 oz), SpO2 96 %.  I/O:   Intake/Output Summary (Last 24 hours) at 02/12/16 1614 Last data filed at 02/12/16 0900  Gross per 24 hour  Intake    480 ml  Output   1068 ml  Net   -588 ml    PHYSICAL EXAMINATION:   GENERAL: 55 y.o.-year-old patient lying in the bed with no acute distress.  EYES: Pupils equal, round, reactive to light and accommodation. No scleral icterus. Extraocular muscles intact.  HEENT: Head atraumatic, normocephalic. Oropharynx and nasopharynx clear.  NECK: Supple, no jugular venous distention. No thyroid enlargement,  no tenderness.  LUNGS: Normal breath sounds bilaterally, no wheezing, rales, rhonchi. No use of accessory muscles of respiration.  CARDIOVASCULAR: S1, S2 normal. II-III/VI Diastolic murmur at the base, No rubs, or gallops.  ABDOMEN: Soft, nontender, nondistended. Bowel sounds present. No organomegaly or mass. + PD cath in place.  EXTREMITIES: No cyanosis, clubbing, + 1 edema on RUE/RLE compared to left.  NEUROLOGIC: Cranial nerves II through XII are intact. No focal Motor or sensory deficits b/l. Globally weak. Dense right sided weakness from previous CVA.  PSYCHIATRIC: The patient is alert and oriented x 3.  SKIN: No obvious rash, lesion, or ulcer.     DATA REVIEW:   CBC  Recent Labs Lab 02/07/16 0717  02/08/16 0246  02/11/16 1020  WBC 9.4  --   --   --   --   HGB 7.0*  < > 8.6*  < > 8.6*  HCT 21.4*  --  25.1*  --   --   PLT 163  --   --   --   --   < > = values in this interval not displayed.  Chemistries   Recent Labs Lab 02/06/16 1856 02/07/16 0717  NA 133* 134*  K 4.9 5.1  CL 98* 100*  CO2 18* 18*  GLUCOSE 88 103*  BUN 92* 95*  CREATININE 7.97* 8.09*  CALCIUM 8.2* 8.0*  AST 23  --   ALT 11*  --   ALKPHOS 91  --   BILITOT 0.8  --     Cardiac Enzymes  Recent Labs Lab 02/06/16 1856  TROPONINI 0.35*    Microbiology Results  Results for orders placed or performed during the hospital encounter of 02/06/16  MRSA PCR Screening     Status: None   Collection Time: 02/06/16 11:22 PM  Result Value Ref Range Status   MRSA by PCR NEGATIVE NEGATIVE Final    Comment:        The GeneXpert MRSA Assay (FDA approved for NASAL specimens only), is one component of a comprehensive MRSA colonization surveillance program. It is not intended to diagnose MRSA infection nor to guide or monitor treatment for MRSA infections.     RADIOLOGY:  No results found.    Management plans discussed with the patient, family and they are in agreement.  CODE STATUS:      Code Status Orders        Start     Ordered   02/06/16 2218  Do not attempt resuscitation (DNR)   Continuous    Question Answer Comment  In the event of cardiac or respiratory ARREST Do not call a "code blue"   In the event of cardiac or respiratory ARREST Do not perform Intubation, CPR, defibrillation or ACLS   In the event of cardiac or respiratory ARREST Use medication by any route, position, wound care, and other measures to relive pain and suffering. May use oxygen, suction and manual treatment of airway obstruction as needed for comfort.      02/06/16 2218    Code Status History    Date Active Date Inactive Code Status Order ID Comments User Context   12/11/2015  3:28 AM 12/19/2015  4:53 PM Full Code [id number]  Ihor Austin, MD Inpatient   11/17/2015  4:50 PM 11/20/2015  3:18 PM Full Code [id number]  Houston Siren, MD Inpatient   10/11/2015  9:32 PM 10/12/2015  5:10 PM Full Code [id number]  Oralia Manis, MD Inpatient   06/23/2015 12:43 AM 06/24/2015  2:21 PM Full Code [id number]  Oralia Manis, MD Inpatient   06/15/2015  2:57 AM 06/16/2015  4:01 PM Full Code [id number]  Wyatt Haste, MD ED      TOTAL TIME TAKING CARE OF THIS PATIENT: 40 minutes.    Houston Siren M.D on 02/12/2016 at 4:14 PM  Between 7am to 6pm - Pager - [phone]  After 6pm go to [email or web address] - password [email or web address] [facility]  [place] [facility]  Office  [phone]  CC: Primary care physician; Sherlene Shams, MD

## 2016-02-12 NOTE — Care Management (Signed)
Patient discharging to home today as she no longer meets inpatient criteria. [facility]- [facility] notified. Patient is open to RN PT and SLP currently. I have requested SLP to be added. I will fax updated home health orders to [facility]. Husband is aware of patient discharge and will take patient by car. No further RNCM needs. Case closed.

## 2016-02-12 NOTE — Progress Notes (Signed)
I do not have updated therapy notes or OT eval yet so I am unable to pursue [employer] approval or fully assess need for an inpatient rehab admission at [facility] in [place]. RN CM is [phone]

## 2016-02-12 NOTE — Progress Notes (Signed)
Subjective:  Progessing well on PD. UF achieved was 1kg last night.   Objective:  Vital signs in last 24 hours:  Temp:  [98.1 F (36.7 C)-98.7 F (37.1 C)] 98.1 F (36.7 C) (02/28 0457) Pulse Rate:  [80-81] 80 (02/28 0457) Resp:  [18-22] 18 (02/28 0457) BP: (110-115)/(78-79) 110/78 mmHg (02/28 0457) SpO2:  [95 %-96 %] 96 % (02/28 0457) Weight:  [55.52 kg (122 lb 6.4 oz)] 55.52 kg (122 lb 6.4 oz) (02/28 0457)  Weight change: -2.858 kg (-6 lb 4.8 oz) Filed Weights   02/10/16 0500 02/11/16 0534 02/12/16 0457  Weight: 61.054 kg (134 lb 9.6 oz) 58.378 kg (128 lb 11.2 oz) 55.52 kg (122 lb 6.4 oz)    Intake/Output:    Intake/Output Summary (Last 24 hours) at 02/12/16 1327 Last data filed at 02/12/16 0900  Gross per 24 hour  Intake    480 ml  Output   1068 ml  Net   -588 ml     Physical Exam: General: Thin, frail  HEENT Pale conjunctiva, moist oral mucous membranes  Neck supple  Pulm/lungs Lungs are clear to auscultation bilaterally  CVS/Heart Regular, prominent murmur, S3 gallop  Abdomen:  Soft, nontender, BS present  Extremities: Right leg edema, no edema on left leg   Neurologic: Right hemiparesis, right arm weakness  Skin: No acute rashes  Access: PD catheter       Basic Metabolic Panel:   Recent Labs Lab 02/06/16 1856 02/07/16 0717 02/12/16 0456  NA 133* 134*  --   K 4.9 5.1  --   CL 98* 100*  --   CO2 18* 18*  --   GLUCOSE 88 103*  --   BUN 92* 95*  --   CREATININE 7.97* 8.09*  --   CALCIUM 8.2* 8.0*  --   PHOS  --   --  4.7*     CBC:  Recent Labs Lab 02/06/16 1856 02/07/16 0717  02/07/16 1933 02/08/16 0246 02/08/16 1249 02/10/16 0528 02/11/16 1020  WBC 8.1 9.4  --   --   --   --   --   --   HGB 4.5* 7.0*  < > 7.3* 8.6* 8.1* 7.8* 8.6*  HCT 14.1* 21.4*  --   --  25.1*  --   --   --   MCV 91.9 88.6  --   --   --   --   --   --   PLT 194 163  --   --   --   --   --   --   < > = values in this interval not  displayed.    Microbiology:  Recent Results (from the past 720 hour(s))  MRSA PCR Screening     Status: None   Collection Time: 02/06/16 11:22 PM  Result Value Ref Range Status   MRSA by PCR NEGATIVE NEGATIVE Final    Comment:        The GeneXpert MRSA Assay (FDA approved for NASAL specimens only), is one component of a comprehensive MRSA colonization surveillance program. It is not intended to diagnose MRSA infection nor to guide or monitor treatment for MRSA infections.     Coagulation Studies: No results for input(s): LABPROT, INR in the last 72 hours.  Urinalysis: No results for input(s): COLORURINE, LABSPEC, PHURINE, GLUCOSEU, HGBUR, BILIRUBINUR, KETONESUR, PROTEINUR, UROBILINOGEN, NITRITE, LEUKOCYTESUR in the last 72 hours.  Invalid input(s): APPERANCEUR    Imaging: No results found.   Medications:     .  sodium chloride   Intravenous Once  . atorvastatin  80 mg Oral q1800  . dialysis solution 1.5% low-MG/low-CA   Intraperitoneal Q24H  . feeding supplement (NEPRO CARB STEADY)  237 mL Oral BID BM  . gentamicin ointment   Topical Daily  . insulin aspart  0-9 Units Subcutaneous 6 times per day  . pantoprazole  40 mg Oral BID  . sodium chloride flush  3 mL Intravenous Q12H  . sucralfate  1 g Oral 4 times per day   alum & mag hydroxide-simeth, bisacodyl, heparin, ipratropium-albuterol, morphine injection, ondansetron **OR** ondansetron (ZOFRAN) IV, phenol-menthol  Assessment/ Plan:  55 y.o. female with history of end-stage renal disease (FSGS) currently on PD, paroxysmal atrial fibrillation, COPD, cirrhosis, stroke with left-sided weakness 2017,severe mitral stenosis,depression, thrombocytopenia, and negative anticardiolipin antibody, weakly positive ANA, acute GI bleed in January 2017 caused by severe esophagitis and bleeding rectal ulcer, IVC filter,  pulmonary arterial hypertensionm, right-sided heart dysfunction consistent with cor pulmonale, significant  venous engorgement of the suprahepatic IVC and hepatic veins suspicious for congestive hepatopathy and cardiac cirrhosis.,  Critical stenosis versus short focal occlusion of the origin of the celiac artery. Atrophic renal arteries bilaterally with associated atrophy of the native kidneys., Peripheral arterial disease with high-grade stenosis versus short segment occlusion of the left common iliac artery, moderate to high-grade stenosis of the proximal right common iliac artery, high-grade stenosis of the origin of the left internal iliac artery, and mild to moderate superficial femoral artery disease (worse on the left than the right) , Large layering right pleural effusion with associated right lower lobe atelectasis. renal osteodystrophy.   1.  End-stage renal disease with pleural effusion  - Tolerating PD well, continue current PD prescription.   2. Anemia of GI bleed (congestive gastropathy? ) and chronic Kidney Disease - hgb up to 8.6 at the moment, holding off on epogen.    3.  Secondary hyperparathyroidism - Phos 4.7 and acceptable, will continue to monitor.       LOS: 6 Dorothy Long 2/28/20171:27 PM

## 2016-02-12 NOTE — Telephone Encounter (Signed)
Thank you.  Will follow as appropriate. 

## 2016-02-12 NOTE — Progress Notes (Signed)
Post peritoneal dialysis 

## 2016-02-12 NOTE — Clinical Social Work Note (Signed)
Pt is ready for discharge home today. Pt will be followed by [facility]. RNCM followed for discharge planning needs. CSW is signing off as no further needs identified.   Dede Query, MSW, LCSW  Clinical Social Worker  [phone]

## 2016-02-12 NOTE — Telephone Encounter (Signed)
HFU/ Pt is being discharged from the hospital today. Diagnosis is Upper GI bleed.  I scheduled pt for 02/28/2016 @ 10:30am. Thank you!

## 2016-02-13 ENCOUNTER — Telehealth: Payer: Self-pay

## 2016-02-13 NOTE — Telephone Encounter (Signed)
Transition Care Management Follow-up Telephone Call   Date discharged? 02/12/16   How have you been since you were released from the hospital? I am doing all right.  Eating/drinking ok.  No signs of bleeding.  No pain. No nausea. No vomiting.  Using inhaler and spiriva.     Do you understand why you were in the hospital? Yes, my blood levels were low, but I do not see any bleeding since I have been home.   Do you understand the discharge instructions? Yes, taking it easy.  Moving slowly between activities.  Home health physical therapy, nursing and home health aide assists.   Where were you discharged to? Home.   Items Reviewed:  Medications reviewed: Taking all scheduled medications without issues.  Allergies reviewed: Yes, no changes.  Dietary changes reviewed: Yes, cardiac and renal diet with no problems.  Referrals reviewed: Yes, appointment scheduled with gastroenterology and nephrology, per her.   Functional Questionnaire:   Activities of Daily Living (ADLs):   She states they are independent in the following: Self feeding, toileting, grooming. States they require assistance with the following: Ambulating (uses walker), dressing, bathing, meal prep.  Home health nursing/aide to assist.   Any transportation issues/concerns?: No.   Any patient concerns? None at this time.   Confirmed importance and date/time of follow-up visits scheduled Yes, appointment scheduled 02/19/15.  Provider Appointment booked with Dr. Darrick Huntsman (PCP).  Confirmed with patient if condition begins to worsen call PCP or go to the ER.  Patient was given the office number and encouraged to call back with question or concerns.  : Yes, patient verbalized understanding.

## 2016-02-14 ENCOUNTER — Telehealth: Payer: Self-pay | Admitting: *Deleted

## 2016-02-14 NOTE — Telephone Encounter (Signed)
[facility] has requested a verbal oreder for occupational therapy. Contact Darl Pikes [phone]

## 2016-02-14 NOTE — Telephone Encounter (Signed)
Pt needs verbal order for additional occupational therapy. Okay to give? Please advise, thanks

## 2016-02-15 NOTE — Telephone Encounter (Signed)
OK TO GIVE VERBAL ORDER FOR OCCUPATIONAL PT

## 2016-02-15 NOTE — Telephone Encounter (Signed)
Noted.Called to verbal order

## 2016-02-18 ENCOUNTER — Ambulatory Visit (INDEPENDENT_AMBULATORY_CARE_PROVIDER_SITE_OTHER): Payer: BC Managed Care – PPO | Admitting: Internal Medicine

## 2016-02-18 ENCOUNTER — Encounter: Payer: Self-pay | Admitting: *Deleted

## 2016-02-18 ENCOUNTER — Encounter: Payer: Self-pay | Admitting: Internal Medicine

## 2016-02-18 ENCOUNTER — Telehealth: Payer: Self-pay | Admitting: *Deleted

## 2016-02-18 VITALS — BP 118/78 | HR 72 | Temp 98.0°F | Resp 12 | Ht 70.0 in | Wt 120.0 lb

## 2016-02-18 DIAGNOSIS — Z800 Family history of malignant neoplasm of digestive organs: Secondary | ICD-10-CM | POA: Diagnosis not present

## 2016-02-18 DIAGNOSIS — Z992 Dependence on renal dialysis: Secondary | ICD-10-CM

## 2016-02-18 DIAGNOSIS — D62 Acute posthemorrhagic anemia: Secondary | ICD-10-CM | POA: Diagnosis not present

## 2016-02-18 DIAGNOSIS — N186 End stage renal disease: Secondary | ICD-10-CM | POA: Diagnosis not present

## 2016-02-18 DIAGNOSIS — I5042 Chronic combined systolic (congestive) and diastolic (congestive) heart failure: Secondary | ICD-10-CM

## 2016-02-18 DIAGNOSIS — N184 Chronic kidney disease, stage 4 (severe): Secondary | ICD-10-CM | POA: Diagnosis not present

## 2016-02-18 DIAGNOSIS — Z09 Encounter for follow-up examination after completed treatment for conditions other than malignant neoplasm: Secondary | ICD-10-CM

## 2016-02-18 DIAGNOSIS — I69322 Dysarthria following cerebral infarction: Secondary | ICD-10-CM

## 2016-02-18 DIAGNOSIS — I639 Cerebral infarction, unspecified: Secondary | ICD-10-CM

## 2016-02-18 LAB — COMPREHENSIVE METABOLIC PANEL
ALK PHOS: 127 U/L — AB (ref 39–117)
ALT: 12 U/L (ref 0–35)
AST: 23 U/L (ref 0–37)
Albumin: 2.4 g/dL — ABNORMAL LOW (ref 3.5–5.2)
BUN: 45 mg/dL — AB (ref 6–23)
CO2: 26 mEq/L (ref 19–32)
Calcium: 8.8 mg/dL (ref 8.4–10.5)
Chloride: 95 mEq/L — ABNORMAL LOW (ref 96–112)
Creatinine, Ser: 8.41 mg/dL (ref 0.40–1.20)
GFR: 6.39 mL/min — CL (ref >60.00)
GLUCOSE: 91 mg/dL (ref 70–99)
POTASSIUM: 3.4 meq/L — AB (ref 3.5–5.1)
SODIUM: 133 meq/L — AB (ref 135–145)
TOTAL PROTEIN: 5.6 g/dL — AB (ref 6.0–8.3)
Total Bilirubin: 0.5 mg/dL (ref 0.2–1.2)

## 2016-02-18 LAB — CBC WITH DIFFERENTIAL/PLATELET
BASOS ABS: 0 10*3/uL (ref 0.0–0.1)
Basophils Relative: 0.7 % (ref 0.0–3.0)
EOS PCT: 2.4 % (ref 0.0–5.0)
Eosinophils Absolute: 0.1 10*3/uL (ref 0.0–0.7)
HCT: 28.4 % — ABNORMAL LOW (ref 36.0–46.0)
Hemoglobin: 9.5 g/dL — ABNORMAL LOW (ref 12.0–15.0)
LYMPHS ABS: 0.8 10*3/uL (ref 0.7–4.0)
Lymphocytes Relative: 14 % (ref 12.0–46.0)
MCHC: 33.4 g/dL (ref 30.0–36.0)
MCV: 84.7 fl (ref 78.0–100.0)
MONO ABS: 0.7 10*3/uL (ref 0.1–1.0)
Monocytes Relative: 12.1 % — ABNORMAL HIGH (ref 3.0–12.0)
NEUTROS PCT: 70.8 % (ref 43.0–77.0)
Neutro Abs: 4.1 10*3/uL (ref 1.4–7.7)
Platelets: 222 10*3/uL (ref 150.0–400.0)
RBC: 3.36 Mil/uL — AB (ref 3.87–5.11)
RDW: 16.2 % — ABNORMAL HIGH (ref 11.5–15.5)
WBC: 5.8 10*3/uL (ref 4.0–10.5)

## 2016-02-18 NOTE — Patient Instructions (Signed)
Reduce the Celexa to 1/1 tablet daily for one week,  Then 1/2 tablet every other day for one week,  Then stop   You will need 10 tablets to do this taper   I'll see you again in a month

## 2016-02-18 NOTE — Telephone Encounter (Signed)
Creatine: 8.41  GFR: 6.39

## 2016-02-18 NOTE — Telephone Encounter (Signed)
Patient on Dialysis.

## 2016-02-18 NOTE — Progress Notes (Signed)
Subjective:  Patient ID: Romilda Garretonnella R Standley, female    DOB: Aug 03, 1961  Age: 55 y.o. MRN: [id number]  CC: The primary encounter diagnosis was Acute blood loss anemia. Diagnoses of Chronic kidney disease (CKD), stage IV (severe) (HCC), ESRD (end stage renal disease) on dialysis [facility](HCC), Family history of colon cancer, Hospital discharge follow-up, Cerebral infarction due to unspecified mechanism, Dysarthria following cerebrovascular accident, and Chronic combined systolic and diastolic CHF (congestive heart failure) (HCC) were also pertinent to this visit.  HPI Jazilyn R Mccarter presents for hospital follow up.  Patient was admitted 2/22  To [facility] with anemia of acute blood less , hgb 4.5 secondary to GI bleed.  Aspirin was suspended and she received a transfusion of  2 units.  After stability of hgb was noted over the following two days she was discharged on Feb 28 of 8.6   Aspirin (started due to recent CVA ) was resumed 5 days ago per DC instructions .  She was prescribed protonix bid and carafate   Recent history:  [facility] with acute left brain CVA and [facility]  on Jan 4 with dysarthria and persistent  right sided deficits.  [facility] was [facility]  [facility] on Jan 11 with hyperkalemia, massive GI bleed requiring transfusions of 4 units.  EGD unremarkable, colonoscopy noted rectal ulcerations  And stable diverticuli.Marland Kitchen.  An IVC filter was placed on Jan 21 due to presence of age indeterminate DVT  In the left CFV  .   She was transferred to rehab facility for PT/OT on Jan 23 and dc'd from rehab facility to home on Feb 9. She was started on bid Protonix , weekly Epogen injections, and daily SSRI therapy with Celexa during [facility] admission .     Outpatient Prescriptions Prior to Visit  Medication Sig Dispense Refill  . acetaminophen (TYLENOL) 325 MG tablet Take 325 mg by mouth every 4 (four) hours as needed for mild pain.     Marland Kitchen. albuterol  (VENTOLIN HFA) 108 (90 BASE) MCG/ACT inhaler Inhale 1 puff into the lungs every 4 (four) hours as needed for wheezing or shortness of breath.     Marland Kitchen. aspirin 81 MG chewable tablet Chew 81 mg by mouth daily.    Marland Kitchen. atorvastatin (LIPITOR) 80 MG tablet Take 1 tablet by mouth daily.    . calcitRIOL (ROCALTROL) 0.5 MCG capsule Take 1 capsule by mouth daily.    . Calcium Acetate 667 MG TABS Take 3 capsules by mouth 3 (three) times daily.    . cinacalcet (SENSIPAR) 30 MG tablet Take 30 mg by mouth daily.    . citalopram (CELEXA) 20 MG tablet Take 1 tablet by mouth daily.    Marland Kitchen. docusate sodium (COLACE) 100 MG capsule Take 100 mg by mouth daily.    . ondansetron (ZOFRAN-ODT) 4 MG disintegrating tablet Take 4 mg by mouth every 12 (twelve) hours as needed for nausea or vomiting.    . pantoprazole (PROTONIX) 40 MG tablet Take 1 tablet by mouth 2 (two) times daily.    . sodium chloride 1 g tablet Take 2 g by mouth 2 (two) times daily with a meal.    . sucralfate (CARAFATE) 1 GM/10ML suspension Take 10 mLs (1 g total) by mouth every 6 (six) hours. 420 mL 0  . tiotropium (SPIRIVA) 18 MCG inhalation capsule Place 1 capsule (18 mcg total) into inhaler and inhale daily. 30 capsule 0  .  epoetin alfa (EPOGEN,PROCRIT) 84132 UNIT/ML injection Inject 25,000 Units into the skin once a week. Reported on 02/18/2016     No facility-administered medications prior to visit.    Review of Systems;  Patient denies headache, fevers, malaise, unintentional weight loss, skin rash, eye pain, sinus congestion and sinus pain, sore throat, dysphagia,  hemoptysis , cough, dyspnea, wheezing, chest pain, palpitations, orthopnea, edema, abdominal pain, nausea, melena, diarrhea, constipation, flank pain, dysuria, hematuria, urinary  Frequency, nocturia, numbness, tingling, seizures,  Focal weakness, Loss of consciousness,  Tremor, insomnia, depression, anxiety, and suicidal ideation.      Objective:  BP 118/78 mmHg  Pulse 72  Temp(Src) 98  F (36.7 C) (Oral)  Resp 12  Ht  (1.778 m)  Wt 120 lb (54.432 kg)  BMI 17.22 kg/m2  SpO2 98%  BP Readings from Last 3 Encounters:  02/18/16 118/78  02/12/16 110/78  12/19/15 123/89    Wt Readings from Last 3 Encounters:  02/18/16 120 lb (54.432 kg)  02/12/16 122 lb 6.4 oz (55.52 kg)  12/19/15 98 lb 12.3 oz (44.8 kg)    General appearance: alert, cooperative and appears stated age Ears: normal TM's and external ear canals both ears Throat: lips, mucosa, and tongue normal; teeth and gums normal Neck: no adenopathy, no carotid bruit, supple, symmetrical, trachea midline and thyroid not enlarged, symmetric, no tenderness/mass/nodules Back: symmetric, no curvature. ROM normal. No CVA tenderness. Lungs: clear to auscultation bilaterally Heart: regular rate and rhythm, S1, S2 normal, no murmur, click, rub or gallop Abdomen: soft, non-tender; bowel sounds normal; no masses,  no organomegaly Pulses: 2+ and symmetric Skin: Skin color, texture, turgor normal. No rashes or lesions Lymph nodes: Cervical, supraclavicular, and axillary nodes normal.  No results found for: HGBA1C  Lab Results  Component Value Date   CREATININE 8.41* 02/18/2016   CREATININE 8.09* 02/07/2016   CREATININE 7.97* 02/06/2016    Lab Results  Component Value Date   WBC 5.8 02/18/2016   HGB 9.5* 02/18/2016   HCT 28.4* 02/18/2016   PLT 222.0 02/18/2016   GLUCOSE 91 02/18/2016   CHOL 137 12/11/2015   TRIG 71 12/11/2015   HDL 42 12/11/2015   LDLDIRECT 164.6 07/21/2012   LDLCALC 81 12/11/2015   ALT 12 02/18/2016   AST 23 02/18/2016   NA 133* 02/18/2016   K 3.4* 02/18/2016   CL 95* 02/18/2016   CREATININE 8.41* 02/18/2016   BUN 45* 02/18/2016   CO2 26 02/18/2016   TSH 1.24 01/19/2014   INR 1.37 02/06/2016   MICROALBUR 63.1* 06/19/2014    No results found.  Assessment & Plan:   Problem List Items Addressed This Visit    Family history of colon cancer    Diagnostic colonoscopy was done  Jan 2017 at [facility] during admission for massive GI bleed,  Rectal ulcerations and stable diverticuli wre noted.       Hospital discharge follow-up    Patient is stable post discharge from [facility] on Feb 28  The last 2 months of hospitalizations were reviewed with  patient,  CBC and BMET were done today  and has no new issues or questions about her discharge plans.       Chronic combined systolic and diastolic CHF (congestive heart failure) (HCC)    Most recent ECHO Feb 11 2016 by Kyra Searles.  EF 55 to 60%        Cerebral infarction due to unspecified mechanism    Left CFV DVT noted per [facility] notes , and  Multi infarcts left brain noted on CT. Marland Kitchen  Paroxysmal atrial fib noted and  ECHO done during [facility] admission in January .  Continue aspirin and lipitor and BP control.  IVC filter placed.  Attempts to anticoagulate with Plavix resulted in a massive GI bleed.       Dysarthria following cerebrovascular accident    Improving with speech therapy.       ESRD (end stage renal disease) on dialysis [facility])    Patient will need HD this week , potassium level is 3.4 today.  Previously she was managing with PD  Lab Results  Component Value Date   NA 133* 02/18/2016   K 3.4* 02/18/2016   CL 95* 02/18/2016   CO2 26 02/18/2016   Lab Results  Component Value Date   CREATININE 8.41* 02/18/2016         Acute blood loss anemia - Primary    No additional  Workup was done given recent [facility] admission which included EGD and colonoscopy.  Hb post transfusion was 8.6 on Feb 28th,  Repeat done today shows continued improvement.  Continue close monitoring.  weekly EPOGEN to be done with caution given recent CVA.:  Lab Results  Component Value Date   WBC 5.8 02/18/2016   HGB 9.5* 02/18/2016   HCT 28.4* 02/18/2016   MCV 84.7 02/18/2016   PLT 222.0 02/18/2016         Relevant Orders   CBC with Differential/Platelet (Completed)    Other Visit Diagnoses    Chronic kidney disease (CKD), stage IV (severe)  (HCC)        Relevant Orders    Comprehensive metabolic panel (Completed)       I am having Ms. Bain maintain her acetaminophen, Calcium Acetate, albuterol, tiotropium, atorvastatin, calcitRIOL, citalopram, pantoprazole, aspirin, cinacalcet, docusate sodium, epoetin alfa, ondansetron, sodium chloride, and sucralfate.  No orders of the defined types were placed in this encounter.    There are no discontinued medications.  Follow-up: No Follow-up on file.   Sherlene Shams, MD

## 2016-02-19 ENCOUNTER — Encounter: Payer: Self-pay | Admitting: Internal Medicine

## 2016-02-19 ENCOUNTER — Ambulatory Visit: Payer: BC Managed Care – PPO | Admitting: Internal Medicine

## 2016-02-19 NOTE — Assessment & Plan Note (Signed)
Most recent ECHO Feb 11 2016 by Kyra SearlesBruce Kowalksi.  EF 55 to 60%

## 2016-02-19 NOTE — Assessment & Plan Note (Addendum)
Left CFV DVT [facility] , and  Multi infarcts left brain noted on CT. Marland Kitchen.  Paroxysmal atrial fib noted and  E[facility] done [facility] .  Continue aspirin and lipitor and BP control.  IVC filter placed.  Attempts to anticoagulate with Plavix resulted in a massive GI bleed.

## 2016-02-19 NOTE — Assessment & Plan Note (Signed)
Improving with speech therapy.

## 2016-02-19 NOTE — Assessment & Plan Note (Addendum)
Patient will need HD this week , potassium level is 3.4 today.  Previously she was managing with PD  Lab Results  Component Value Date   NA 133* 02/18/2016   K 3.4* 02/18/2016   CL 95* 02/18/2016   CO2 26 02/18/2016   Lab Results  Component Value Date   CREATININE 8.41* 02/18/2016

## 2016-02-19 NOTE — Assessment & Plan Note (Signed)
No additional  Workup was done [facility] which [facility] and colon[facility].  Hb post transfusion was 8.6 on Feb 28th,  Repeat done today shows continued improvement.  Continue close monitoring.  weekly EPOGEN to be done with caution given recent CVA.:  Lab Results  Component Value Date   WBC 5.8 02/18/2016   HGB 9.5* 02/18/2016   HCT 28.4* 02/18/2016   MCV 84.7 02/18/2016   PLT 222.0 02/18/2016

## 2016-02-19 NOTE — Assessment & Plan Note (Signed)
Patient is stable post discharge from [facility] on Feb 28  The last 2 months of hospitalizations were reviewed with  patient,  CBC and BMET were done today  and has no new issues or questions about her discharge plans.

## 2016-02-19 NOTE — Assessment & Plan Note (Signed)
Diagnostic colon[facility] was [facility] during admission for massive GI bleed,  Rectal ulcerations and stable diverticuli wre noted.

## 2016-02-21 ENCOUNTER — Telehealth: Payer: Self-pay | Admitting: Cardiovascular Disease

## 2016-02-21 IMAGING — US US CAROTID DUPLEX BILAT
1 series · 13 of 24 positions shown · non-contrast
Comparison: None.

CLINICAL DATA: Cerebral infarction.

EXAM:
BILATERAL CAROTID DUPLEX ULTRASOUND
TECHNIQUE: Gray scale imaging, color Doppler and duplex ultrasound were
performed of bilateral carotid and vertebral arteries in the neck.

[Series 1: us carotid duplex bilat · 13 of 62 slices shown]
[im 1/62]
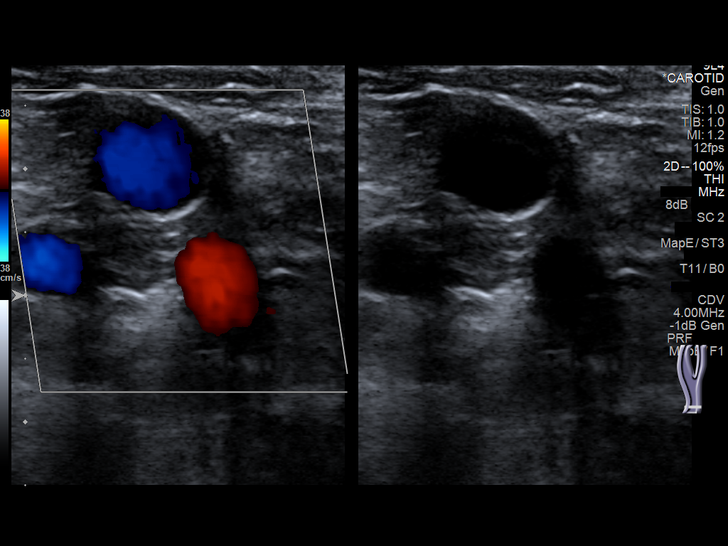
[im 6/62]
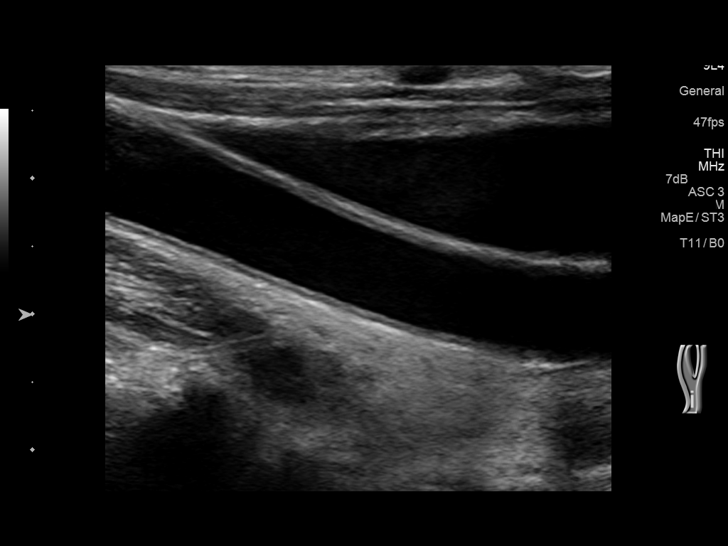
[im 11/62]
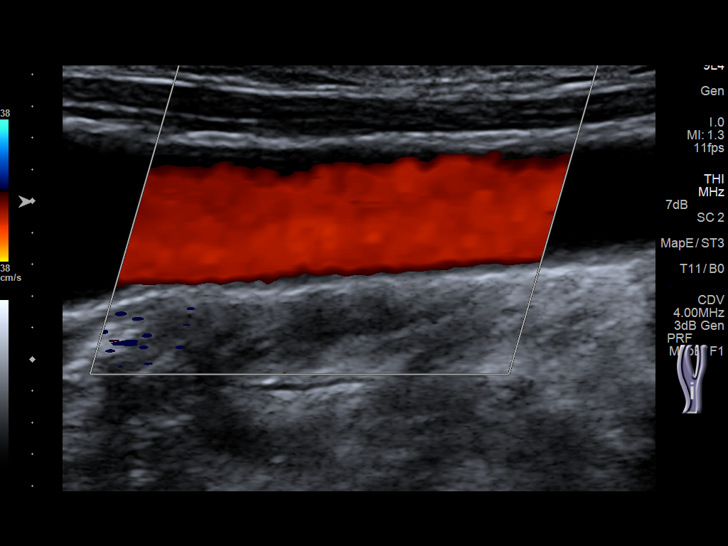
[im 16/62]
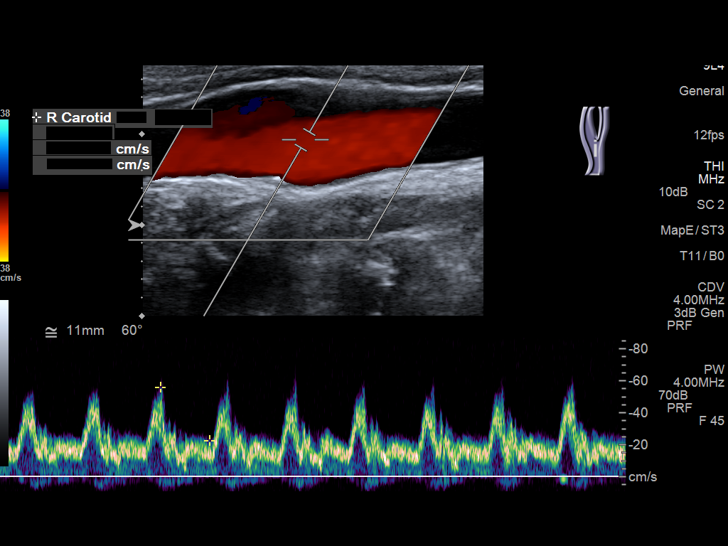
[im 22/62]
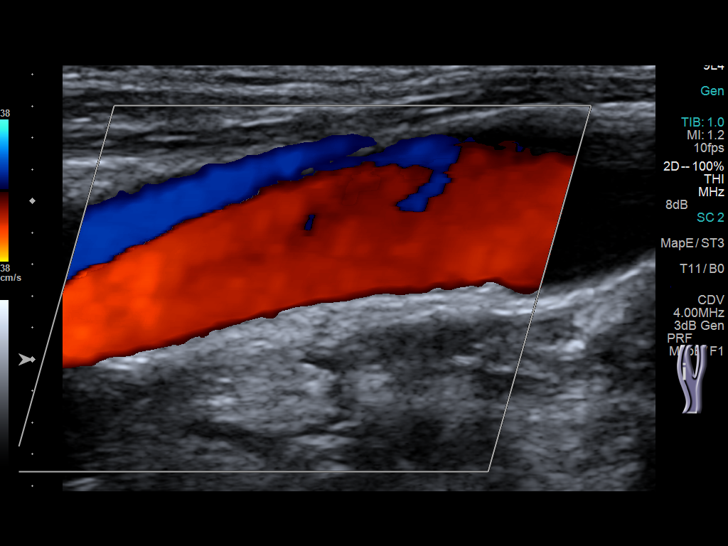
[im 27/62]
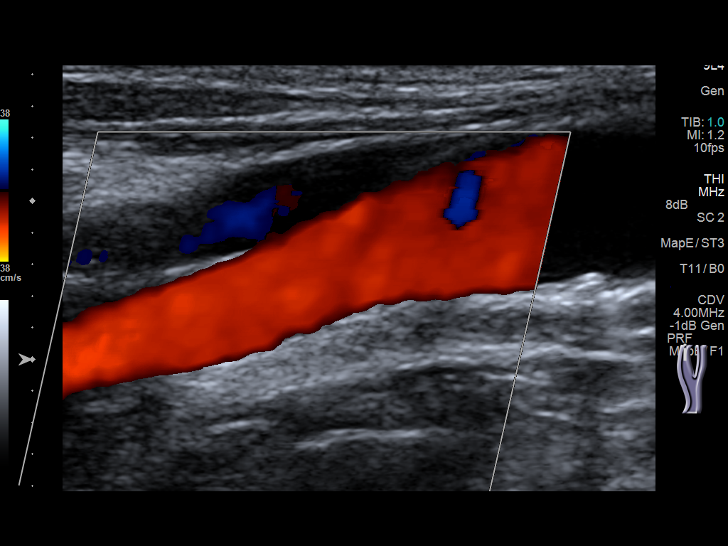
[im 32/62]
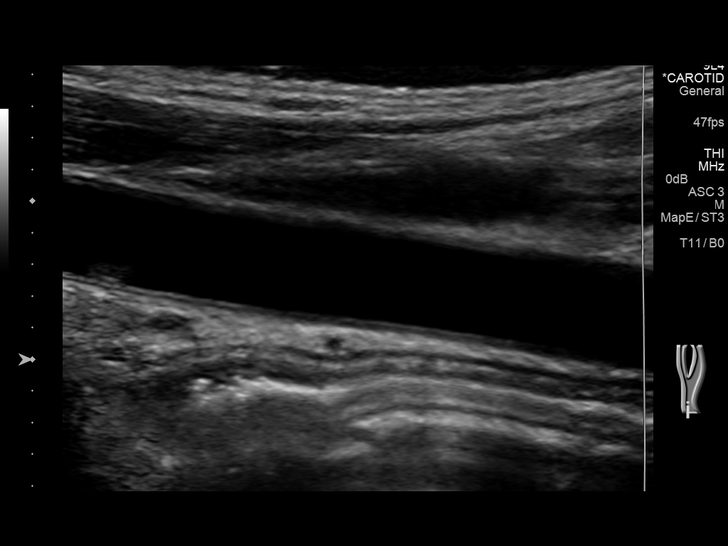
[im 35/62]
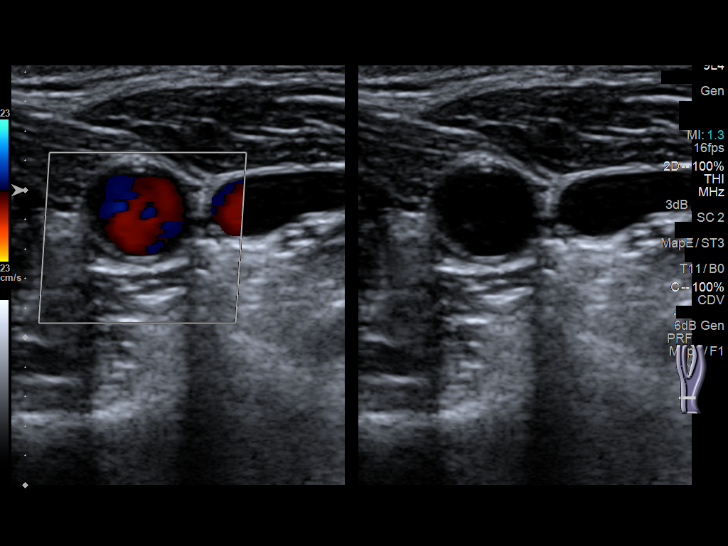
[im 40/62]
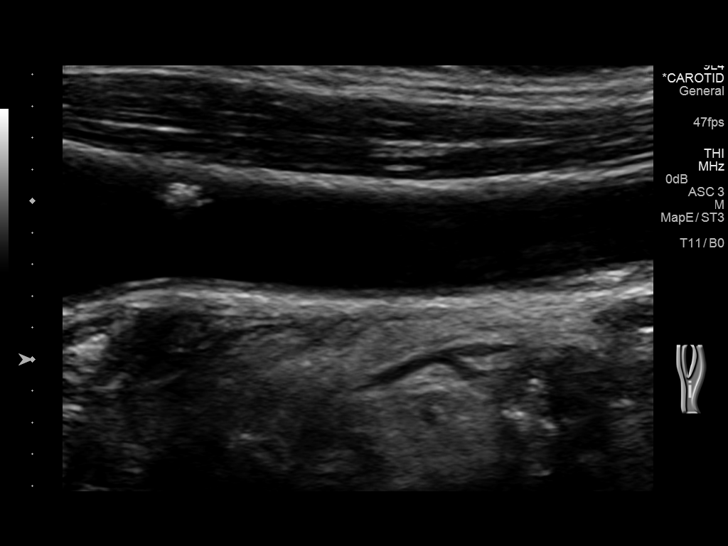
[im 46/62]
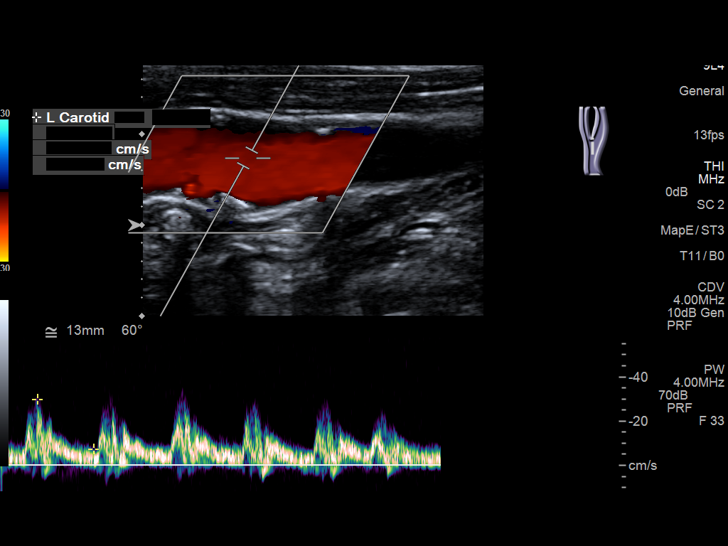
[im 51/62]
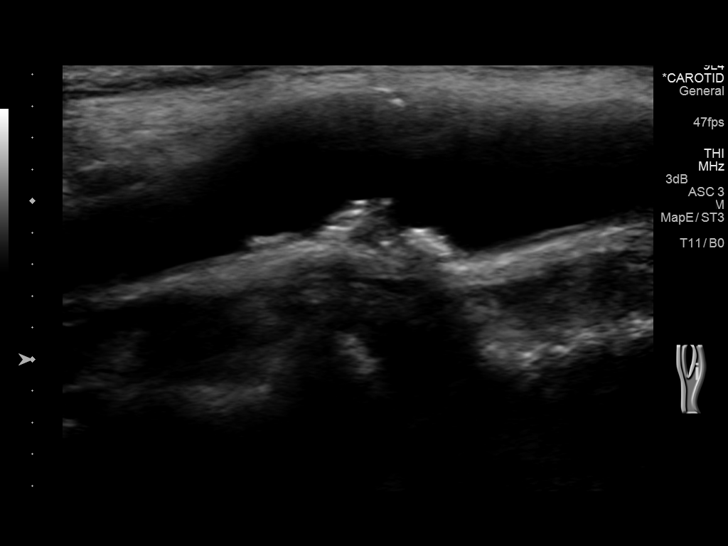
[im 56/62]
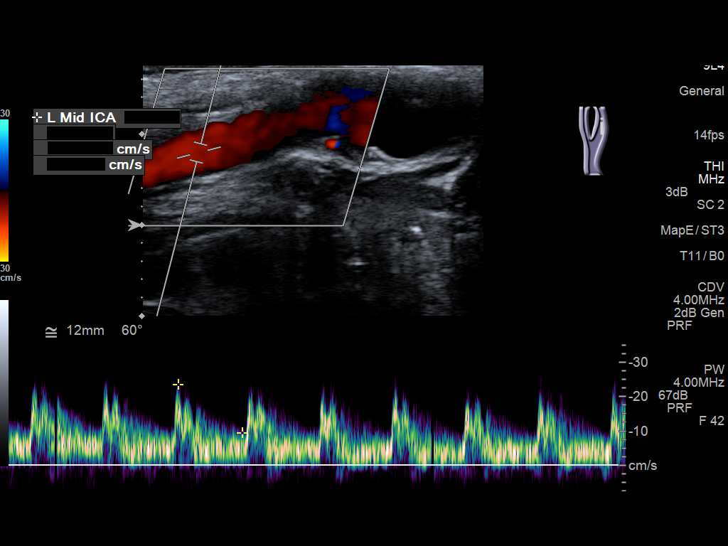
[im 62/62]
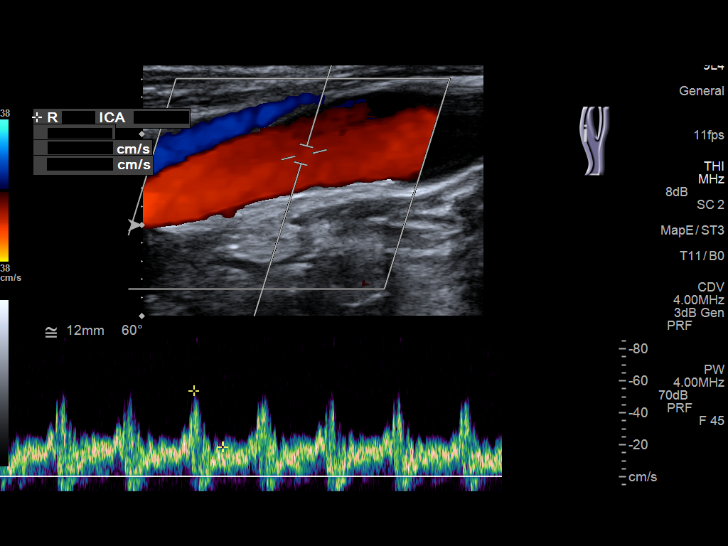

[13 of 24 positions shown; findings below may reference images not displayed]

FINDINGS: Criteria: Quantification of carotid stenosis is based on velocity
parameters that correlate the residual internal carotid diameter
with NASCET-based stenosis levels, using the diameter of the distal
internal carotid lumen as the denominator for stenosis measurement.

The following velocity measurements were obtained:

RIGHT

ICA:  54/19 cm/sec

CCA:  91/28 cm/sec

SYSTOLIC ICA/CCA RATIO:

DIASTOLIC ICA/CCA RATIO:

ECA:  45 cm/sec

LEFT

ICA:  25/11 cm/sec

CCA:  65/13 cm/sec

SYSTOLIC ICA/CCA RATIO:

DIASTOLIC ICA/CCA RATIO:

ECA:  49 cm/sec

RIGHT CAROTID ARTERY: There is a mild amount of calcified plaque at
the level of the distal common carotid artery, carotid bulb and
proximal ICA. Based on velocities estimated right ICA stenosis is
less than 50%.

RIGHT VERTEBRAL ARTERY: Antegrade flow with normal waveform and
velocity.

LEFT CAROTID ARTERY: Moderate focal calcified plaque present at the
level of the distal bulb and proximal ICA. Based on turbulent flow
focally present in the plaque, there may be a component of plaque
ulceration. Based on normal velocities, estimated left ICA stenosis
is less than 50%.

LEFT VERTEBRAL ARTERY: Antegrade flow with normal waveform and
velocity.
IMPRESSION: No significant carotid stenosis identified. Bilateral plaque noted
at the level of the bulbs and proximal internal carotid arteries,
left greater than right. The proximal ICA plaque on the left may be
partially ulcerated. Estimated bilateral ICA stenoses are less than
50%.

## 2016-02-21 NOTE — Telephone Encounter (Signed)
3rd attempt to schedule fu from recall list.  lmov to call office.   Deleting recall.

## 2016-02-28 ENCOUNTER — Ambulatory Visit: Payer: BC Managed Care – PPO | Admitting: Internal Medicine

## 2016-03-04 ENCOUNTER — Emergency Department: Payer: BC Managed Care – PPO

## 2016-03-04 ENCOUNTER — Inpatient Hospital Stay
Admission: EM | Admit: 2016-03-04 | Discharge: 2016-03-11 | DRG: 186 | Disposition: A | Discharge status: Other Acute Inpatient Hospital | Payer: BC Managed Care – PPO | Attending: Internal Medicine | Admitting: Internal Medicine

## 2016-03-04 DIAGNOSIS — I050 Rheumatic mitral stenosis: Secondary | ICD-10-CM | POA: Diagnosis present

## 2016-03-04 DIAGNOSIS — N269 Renal sclerosis, unspecified: Secondary | ICD-10-CM | POA: Diagnosis present

## 2016-03-04 DIAGNOSIS — Z992 Dependence on renal dialysis: Secondary | ICD-10-CM

## 2016-03-04 DIAGNOSIS — J90 Pleural effusion, not elsewhere classified: Secondary | ICD-10-CM | POA: Diagnosis not present

## 2016-03-04 DIAGNOSIS — D638 Anemia in other chronic diseases classified elsewhere: Secondary | ICD-10-CM | POA: Diagnosis present

## 2016-03-04 DIAGNOSIS — I69351 Hemiplegia and hemiparesis following cerebral infarction affecting right dominant side: Secondary | ICD-10-CM

## 2016-03-04 DIAGNOSIS — K210 Gastro-esophageal reflux disease with esophagitis: Secondary | ICD-10-CM | POA: Diagnosis present

## 2016-03-04 DIAGNOSIS — Z888 Allergy status to other drugs, medicaments and biological substances status: Secondary | ICD-10-CM

## 2016-03-04 DIAGNOSIS — I509 Heart failure, unspecified: Secondary | ICD-10-CM | POA: Diagnosis present

## 2016-03-04 DIAGNOSIS — Z8249 Family history of ischemic heart disease and other diseases of the circulatory system: Secondary | ICD-10-CM

## 2016-03-04 DIAGNOSIS — N185 Chronic kidney disease, stage 5: Secondary | ICD-10-CM

## 2016-03-04 DIAGNOSIS — N186 End stage renal disease: Secondary | ICD-10-CM | POA: Diagnosis present

## 2016-03-04 DIAGNOSIS — Z7982 Long term (current) use of aspirin: Secondary | ICD-10-CM

## 2016-03-04 DIAGNOSIS — I700 Atherosclerosis of aorta: Secondary | ICD-10-CM | POA: Insufficient documentation

## 2016-03-04 DIAGNOSIS — N2581 Secondary hyperparathyroidism of renal origin: Secondary | ICD-10-CM | POA: Diagnosis present

## 2016-03-04 DIAGNOSIS — I272 Other secondary pulmonary hypertension: Secondary | ICD-10-CM | POA: Diagnosis present

## 2016-03-04 DIAGNOSIS — M7989 Other specified soft tissue disorders: Secondary | ICD-10-CM | POA: Diagnosis present

## 2016-03-04 DIAGNOSIS — E785 Hyperlipidemia, unspecified: Secondary | ICD-10-CM | POA: Diagnosis present

## 2016-03-04 DIAGNOSIS — I5042 Chronic combined systolic (congestive) and diastolic (congestive) heart failure: Secondary | ICD-10-CM | POA: Diagnosis present

## 2016-03-04 DIAGNOSIS — I480 Paroxysmal atrial fibrillation: Secondary | ICD-10-CM | POA: Diagnosis present

## 2016-03-04 DIAGNOSIS — I132 Hypertensive heart and chronic kidney disease with heart failure and with stage 5 chronic kidney disease, or end stage renal disease: Secondary | ICD-10-CM | POA: Diagnosis present

## 2016-03-04 DIAGNOSIS — Z79899 Other long term (current) drug therapy: Secondary | ICD-10-CM

## 2016-03-04 DIAGNOSIS — R079 Chest pain, unspecified: Secondary | ICD-10-CM | POA: Diagnosis present

## 2016-03-04 DIAGNOSIS — J449 Chronic obstructive pulmonary disease, unspecified: Secondary | ICD-10-CM | POA: Diagnosis present

## 2016-03-04 DIAGNOSIS — R4701 Aphasia: Secondary | ICD-10-CM | POA: Diagnosis present

## 2016-03-04 DIAGNOSIS — Z885 Allergy status to narcotic agent status: Secondary | ICD-10-CM

## 2016-03-04 DIAGNOSIS — I82409 Acute embolism and thrombosis of unspecified deep veins of unspecified lower extremity: Secondary | ICD-10-CM

## 2016-03-04 DIAGNOSIS — Z881 Allergy status to other antibiotic agents status: Secondary | ICD-10-CM

## 2016-03-04 DIAGNOSIS — K219 Gastro-esophageal reflux disease without esophagitis: Secondary | ICD-10-CM | POA: Diagnosis present

## 2016-03-04 DIAGNOSIS — E1122 Type 2 diabetes mellitus with diabetic chronic kidney disease: Secondary | ICD-10-CM | POA: Diagnosis present

## 2016-03-04 DIAGNOSIS — I2510 Atherosclerotic heart disease of native coronary artery without angina pectoris: Secondary | ICD-10-CM | POA: Insufficient documentation

## 2016-03-04 DIAGNOSIS — I083 Combined rheumatic disorders of mitral, aortic and tricuspid valves: Secondary | ICD-10-CM | POA: Diagnosis present

## 2016-03-04 DIAGNOSIS — R0602 Shortness of breath: Secondary | ICD-10-CM | POA: Insufficient documentation

## 2016-03-04 LAB — CBC
HEMATOCRIT: 28.1 % — AB (ref 35.0–47.0)
HEMOGLOBIN: 9.3 g/dL — AB (ref 12.0–16.0)
MCH: 28 pg (ref 26.0–34.0)
MCHC: 33.2 g/dL (ref 32.0–36.0)
MCV: 84.3 fL (ref 80.0–100.0)
Platelets: 177 10*3/uL (ref 150–440)
RBC: 3.33 MIL/uL — ABNORMAL LOW (ref 3.80–5.20)
RDW: 16.6 % — ABNORMAL HIGH (ref 11.5–14.5)
WBC: 5.5 10*3/uL (ref 3.6–11.0)

## 2016-03-04 LAB — BASIC METABOLIC PANEL
ANION GAP: 13 (ref 5–15)
BUN: 53 mg/dL — ABNORMAL HIGH (ref 6–20)
CHLORIDE: 96 mmol/L — AB (ref 101–111)
CO2: 23 mmol/L (ref 22–32)
Calcium: 8.2 mg/dL — ABNORMAL LOW (ref 8.9–10.3)
Creatinine, Ser: 8.09 mg/dL — ABNORMAL HIGH (ref 0.44–1.00)
GFR calc Af Amer: 6 mL/min — ABNORMAL LOW (ref >60)
GFR, EST NON AFRICAN AMERICAN: 5 mL/min — AB (ref >60)
GLUCOSE: 91 mg/dL (ref 65–99)
POTASSIUM: 3.7 mmol/L (ref 3.5–5.1)
Sodium: 132 mmol/L — ABNORMAL LOW (ref 135–145)

## 2016-03-04 LAB — TROPONIN I: Troponin I: 0.06 ng/mL — ABNORMAL HIGH (ref <0.031)

## 2016-03-04 MED ORDER — MORPHINE SULFATE (PF) 2 MG/ML IV SOLN
2.0000 mg | Freq: Once | INTRAVENOUS | Status: AC
  Administered 2016-03-04: 2 mg via INTRAVENOUS
  Filled 2016-03-04: qty 1

## 2016-03-04 MED ORDER — ONDANSETRON HCL 4 MG/2ML IJ SOLN
4.0000 mg | Freq: Once | INTRAMUSCULAR | Status: AC
  Administered 2016-03-04: 4 mg via INTRAVENOUS
  Filled 2016-03-04: qty 2

## 2016-03-04 NOTE — H&P (Signed)
[facility] [place] at [facility]   PATIENT NAME: Dorothy Long    MR#:  [id number]  DATE OF BIRTH:  May 01, 1961  DATE OF ADMISSION:  03/04/2016  PRIMARY CARE PHYSICIAN: Sherlene Shams, MD   REQUESTING/REFERRING PHYSICIAN: Inocencio Homes, MD  CHIEF COMPLAINT:   Chief Complaint  Patient presents with  . Leg Pain  . Chest Pain    HISTORY OF PRESENT ILLNESS:  Dorothy Long  is a 55 y.o. female who presents with chest pain for the last week and increasing right lower extremity swelling and pain for the last several days. The symptoms finally progressed to the point that she was brought in the ED by her husband. Here she was found on chest x-ray to have some vascular congestion as well as an increase in the size of her right pleural effusion, though it is still relatively small in size. Ultrasound of her lower extremity ruled out DVT. Her troponin was mildly elevated at 0.06, though this is in the setting of chronic heart failure and renal disease. Patient is also had a stroke in the past and is somewhat difficult to obtain information from for the history of present illness. Her husband is present with her in the ED and provides collateral information. Given the persistence of her chest pain and her overall risk factors, hospitalists were called for admission.  PAST MEDICAL HISTORY:   Past Medical History  Diagnosis Date  . Hypertension   . Hyperlipidemia   . COPD (chronic obstructive pulmonary disease) (HCC)     a. not on home O2  . Anemia   . Valvular disease     a. 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 echo 2015: EF 45-50%, borderline LVH, mod dilated LA, mildly dilated RA, small pericardial effusion, mod MR (rheumatic deformity), MS mild-mod, no gradient, PASP ele  . Chronic  combined systolic and diastolic CHF (congestive heart failure) (HCC)     a. echo reports above  . PAF (paroxysmal atrial fibrillation) (HCC)     a. in setting of diarrhea 09/21/2013; b. not on long term full dose anticoagulation; c. CHADSVASC at least 4 (CHF, HTN, DM, female)  . Diabetes mellitus (HCC)   . Peritoneal dialysis status (HCC) 4- 15-15  . Ventral hernia   . Inguinal hernia   . History of stress test     a. 2014: no convincing evidence of pharmacologically induced ischemia,  apparent ant apical defect present only on attenuation corrected images favored to reflect artifact due to overcorrection & may be worse on stress imaging secondary to greater patient motion. A small area of ischemia is felt less likely but is difficult to entirely exclude, EF 55%  . ESRD (end stage renal disease) on dialysis (HCC)     a. HD on T,T,S; b. 2/2 focal segmental glomerulosclerosis   . Dialysis patient (HCC)   . Renal insufficiency   . Stroke [facility]) 11/2015    a. MRI L cereblal infarctions in the MCA/PCA, ACA/MCA territory; b. felt to be carotid in etiology; c. medically managed on DAPT per neuro    PAST SURGICAL HISTORY:   Past Surgical History  Procedure Laterality Date  . Abdominal hysterectomy    . Cholecystectomy    . Knee surgery Right   . Foot surgery Bilateral   . Av  fistula placement Left 10-26-13    Dr Gilda Crease  . Peritoneal catheter insertion  03-29-14    Dr Gilda Crease  . Inguinal hernia repair Left 07/13/2015    Procedure: HERNIA REPAIR INGUINAL ADULT;  Surgeon: Earline Mayotte, MD;  Location: [facility] ORS;  Service: General;  Laterality: Left;  Marland Kitchen Ventral hernia repair N/A 07/13/2015    Procedure: HERNIA REPAIR VENTRAL ADULT;  Surgeon: Earline Mayotte, MD;  Location: [facility] ORS;  Service: General;  Laterality: N/A;    SOCIAL HISTORY:   Social History  Substance Use Topics  . Smoking status: Former Smoker -- 0.00 packs/day for 0 years  . Smokeless tobacco: Never Used  . Alcohol Use:  No     Comment: occasional    FAMILY HISTORY:   Family History  Problem Relation Age of Onset  . Cancer Mother     colon  . Heart disease Father   . Hypertension Father     DRUG ALLERGIES:   Allergies  Allergen Reactions  . Buprenorphine Nausea And Vomiting  . Ciprocinonide [Fluocinolone] Hives  . Demerol [Meperidine] Nausea And Vomiting  . Diflucan [Fluconazole] Nausea And Vomiting  . Flagyl [Metronidazole] Hives and Itching  . Hydrocodone Itching  . Levaquin [Levofloxacin] Other (See Comments)    Reaction: Numbness in legs   . Morphine And Related Nausea And Vomiting  . Tetracyclines & Related Itching and Swelling  . Valium [Diazepam] Nausea And Vomiting and Other (See Comments)    Reaction:  Hallucinations     MEDICATIONS AT HOME:   Prior to Admission medications   Medication Sig Start Date End Date Taking? Authorizing Provider  acetaminophen (TYLENOL) 325 MG tablet Take 650 mg by mouth every 4 (four) hours as needed for mild pain or headache.    Yes Historical Provider, MD  albuterol (PROVENTIL HFA;VENTOLIN HFA) 108 (90 Base) MCG/ACT inhaler Inhale 2 puffs into the lungs every 6 (six) hours as needed for wheezing or shortness of breath.   Yes Historical Provider, MD  aspirin 81 MG chewable tablet Chew 81 mg by mouth daily.   Yes Historical Provider, MD  atorvastatin (LIPITOR) 80 MG tablet Take 80 mg by mouth daily.    Yes Historical Provider, MD  calcitRIOL (ROCALTROL) 0.5 MCG capsule Take 0.5 mcg by mouth daily.    Yes Historical Provider, MD  calcium acetate (PHOSLO) 667 MG capsule Take 2,001 mg by mouth 3 (three) times daily with meals.   Yes Historical Provider, MD  cinacalcet (SENSIPAR) 30 MG tablet Take 30 mg by mouth daily.   Yes Historical Provider, MD  docusate sodium (COLACE) 100 MG capsule Take 100 mg by mouth daily as needed for mild constipation.    Yes Historical Provider, MD  ondansetron (ZOFRAN-ODT) 4 MG disintegrating tablet Take 4 mg by mouth every 8  (eight) hours as needed for nausea or vomiting.    Yes Historical Provider, MD  pantoprazole (PROTONIX) 40 MG tablet Take 40 mg by mouth 2 (two) times daily.    Yes Historical Provider, MD  sodium chloride 1 g tablet Take 2 g by mouth 2 (two) times daily with a meal.   Yes Historical Provider, MD  sucralfate (CARAFATE) 1 GM/10ML suspension Take 10 mLs (1 g total) by mouth every 6 (six) hours. 02/12/16  Yes Houston Siren, MD  tiotropium (SPIRIVA) 18 MCG inhalation capsule Place 1 capsule (18 mcg total) into inhaler and inhale daily. 08/22/15  Yes Carollee Leitz, NP    REVIEW OF SYSTEMS:  Review  of Systems  Constitutional: Negative for fever, chills, weight loss and malaise/fatigue.  HENT: Negative for ear pain, hearing loss and tinnitus.   Eyes: Negative for blurred vision, double vision, pain and redness.  Respiratory: Negative for cough, hemoptysis and shortness of breath.   Cardiovascular: Positive for chest pain and leg swelling. Negative for palpitations and orthopnea.  Gastrointestinal: Negative for nausea, vomiting, abdominal pain, diarrhea and constipation.  Genitourinary: Negative for dysuria, frequency and hematuria.  Musculoskeletal: Negative for back pain, joint pain and neck pain.  Skin:       No acne, rash, or lesions  Neurological: Negative for dizziness, tremors, focal weakness and weakness.  Endo/Heme/Allergies: Negative for polydipsia. Does not bruise/bleed easily.  Psychiatric/Behavioral: Negative for depression. The patient is not nervous/anxious and does not have insomnia.      VITAL SIGNS:   Filed Vitals:   03/04/16 1828  BP: 118/85  Pulse: 88  Temp: 98.2 F (36.8 C)  TempSrc: Oral  Resp: 16  Height:  (1.778 m)  Weight: 52.164 kg (115 lb)  SpO2: 99%   Wt Readings from Last 3 Encounters:  03/04/16 52.164 kg (115 lb)  02/18/16 54.432 kg (120 lb)  02/12/16 55.52 kg (122 lb 6.4 oz)    PHYSICAL EXAMINATION:  Physical Exam  Vitals  reviewed. Constitutional: She is oriented to person, place, and time. She appears well-developed and well-nourished. No distress.  HENT:  Head: Normocephalic and atraumatic.  Mouth/Throat: Oropharynx is clear and moist.  Eyes: Conjunctivae and EOM are normal. Pupils are equal, round, and reactive to light. No scleral icterus.  Neck: Normal range of motion. Neck supple. No JVD present. No thyromegaly present.  Cardiovascular: Normal rate, regular rhythm and intact distal pulses.  Exam reveals no gallop and no friction rub.   No murmur heard. Respiratory: Effort normal. No respiratory distress. She has no wheezes. She has rales (Mild fine crackles).  GI: Soft. Bowel sounds are normal. She exhibits no distension. There is no tenderness.  Musculoskeletal: Normal range of motion. She exhibits edema (2+ right lower extremity).  No arthritis, no gout  Lymphadenopathy:    She has no cervical adenopathy.  Neurological: She is alert and oriented to person, place, and time. No cranial nerve deficit.  No dysarthria, no aphasia  Skin: Skin is warm and dry. No rash noted. No erythema.  Psychiatric: She has a normal mood and affect. Her behavior is normal. Judgment and thought content normal.    LABORATORY PANEL:   CBC  Recent Labs Lab 03/04/16 1843  WBC 5.5  HGB 9.3*  HCT 28.1*  PLT 177   ------------------------------------------------------------------------------------------------------------------  Chemistries   Recent Labs Lab 03/04/16 1843  NA 132*  K 3.7  CL 96*  CO2 23  GLUCOSE 91  BUN 53*  CREATININE 8.09*  CALCIUM 8.2*   ------------------------------------------------------------------------------------------------------------------  Cardiac Enzymes  Recent Labs Lab 03/04/16 1843  TROPONINI 0.06*   ------------------------------------------------------------------------------------------------------------------  RADIOLOGY:  Dg Chest 2 View  03/04/2016   CLINICAL DATA:  Right lower leg pain and swelling for 5 days. Right-sided chest pain starting yesterday. EXAM: CHEST  2 VIEW COMPARISON:  12/18/2015 FINDINGS: Cardiac enlargement with mild central pulmonary vascular congestion suggesting arterial hypertension. Calcification projected over the heart corresponding calcification in the mitral valve annulus on prior CT. Mediastinal contours appear intact. There is increasing right pleural effusion with basilar atelectasis or consolidation in the right lung since the previous study. No pneumothorax. Vena caval filter and surgical clips demonstrated in the right upper quadrant.  IMPRESSION: Cardiac enlargement with central pulmonary vascular congestion. Increasing right pleural effusion with basilar infiltration or atelectasis. Electronically Signed   By: Burman NievesWilliam  Stevens M.D.   On: 03/04/2016 19:40   [place] Venous Img Lower Unilateral Right  03/04/2016  CLINICAL DATA:  Pain and swelling in the right leg for 2 weeks. Previous history of deep venous thrombosis. Aspirin anticoagulation therapy. EXAM: Right LOWER EXTREMITY VENOUS DOPPLER ULTRASOUND TECHNIQUE: Gray-scale sonography with graded compression, as well as color Doppler and duplex ultrasound were performed to evaluate the lower extremity deep venous systems from the level of the common femoral vein and including the common femoral, femoral, profunda femoral, popliteal and calf veins including the posterior tibial, peroneal and gastrocnemius veins when visible. The superficial great saphenous vein was also interrogated. Spectral Doppler was utilized to evaluate flow at rest and with distal augmentation maneuvers in the common femoral, femoral and popliteal veins. COMPARISON:  None. FINDINGS: Contralateral Common Femoral Vein: Respiratory phasicity is normal and symmetric with the symptomatic side. No evidence of thrombus. Normal compressibility. Common Femoral Vein: No evidence of thrombus. Normal compressibility,  respiratory phasicity and response to augmentation. Saphenofemoral Junction: No evidence of thrombus. Normal compressibility and flow on color Doppler imaging. Profunda Femoral Vein: No evidence of thrombus. Normal compressibility and flow on color Doppler imaging. Femoral Vein: No evidence of thrombus. Normal compressibility, respiratory phasicity and response to augmentation. Popliteal Vein: No evidence of thrombus. Normal compressibility, respiratory phasicity and response to augmentation. Calf Veins: No evidence of thrombus. Normal compressibility and flow on color Doppler imaging. Superficial Great Saphenous Vein: No evidence of thrombus. Normal compressibility and flow on color Doppler imaging. Venous Reflux:  None. Other Findings:  None. IMPRESSION: No evidence of deep venous thrombosis. Electronically Signed   By: Burman NievesWilliam  Stevens M.D.   On: 03/04/2016 19:31    EKG:   Orders placed or performed during the hospital encounter of 03/04/16  . EKG 12-Lead  . EKG 12-Lead  . ED EKG within 10 minutes  . ED EKG within 10 minutes    IMPRESSION AND PLAN:  Principal Problem:   Chest pain - cycle her enzymes tonight, get an echocardiogram tomorrow. When necessary pain meds for severe chest pain tonight. Active Problems:   Chronic kidney disease with peritoneal dialysis as preferred modality, stage 5 [facility](HCC) - nephrology consult for assistance with dialysis and overall assessment of fluid status.   Chronic combined systolic and diastolic CHF (congestive heart failure) (HCC) - continue home meds   Hyperlipidemia - continue home meds   GERD (gastroesophageal reflux disease) - home dose PPI   Paroxysmal atrial fibrillation (HCC) - currently in sinus rhythm, monitor with telemetry  All the records are reviewed and case discussed with ED provider. Management plans discussed with the patient and/or family.  DVT PROPHYLAXIS: SubQ heparin  GI PROPHYLAXIS: PPI  ADMISSION STATUS: Observation  CODE  STATUS: Full Code Status History    Date Active Date Inactive Code Status Order ID Comments User Context   02/06/2016 10:18 PM 02/12/2016  4:38 PM DNR [id number]  Milagros LollSrikar Sudini, MD ED   12/11/2015  3:28 AM 12/19/2015  4:53 PM Full Code [id number]  Ihor AustinPavan Pyreddy, MD Inpatient   11/17/2015  4:50 PM 11/20/2015  3:18 PM Full Code [id number]  Houston SirenVivek J Sainani, MD Inpatient   10/11/2015  9:32 PM 10/12/2015  5:10 PM Full Code [id number]  Oralia Manisavid Maelani Yarbro, MD Inpatient   06/23/2015 12:43 AM 06/24/2015  2:21 PM Full Code [id number]  Oralia Manisavid Anquan Azzarello, MD Inpatient  06/15/2015  2:57 AM 06/16/2015  4:01 PM Full Code [id number]  Wyatt Haste, MD ED    Questions for Most Recent Historical Code Status (Order [id number])    Question Answer Comment   In the event of cardiac or respiratory ARREST Do not call a "code blue"    In the event of cardiac or respiratory ARREST Do not perform Intubation, CPR, defibrillation or ACLS    In the event of cardiac or respiratory ARREST Use medication by any route, position, wound care, and other measures to relive pain and suffering. May use oxygen, suction and manual treatment of airway obstruction as needed for comfort.       TOTAL TIME TAKING CARE OF THIS PATIENT: 45 minutes.    Ambri Miltner FIELDING 03/04/2016, 10:03 PM  [facility]  Office  [phone]  CC: Primary care physician; Sherlene Shams, MD

## 2016-03-04 NOTE — ED Notes (Signed)
Pt c/o right lower leg pain and swelling for the 5 days, as well as chest pain that started yesterday, states chest pain is worse with movement and deep breathing..Marland Kitchen

## 2016-03-04 NOTE — ED Notes (Signed)
Charge nurse notified of elevated troponin 

## 2016-03-04 NOTE — ED Provider Notes (Signed)
[facility] Emergency Department Provider Note  ____________________________________________  Time seen: Approximately 8:29 PM  I have reviewed the triage vital signs and the nursing notes.   HISTORY  Chief Complaint Leg Pain and Chest Pain    HPI Dorothy Long is a 55 y.o. 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 No shortness of breath. No fevers. No cough. No abdominal pain, vomiting, diarrhea, fevers or chills and she has not had this pain previously.   Past Medical History  Diagnosis Date  . Hypertension   . Hyperlipidemia   . COPD (chronic obstructive pulmonary disease) (HCC)     a. not on home O2  . Anemia   . Valvular disease     a. 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 echo 2015: EF 45-50%, borderline LVH, mod dilated LA, mildly dilated RA, small pericardial effusion, mod MR (rheumatic deformity), MS mild-mod, no gradient, PASP ele  . Chronic combined systolic and diastolic CHF (congestive heart failure) (HCC)     a. echo reports above  . PAF (paroxysmal atrial fibrillation) (HCC)     a. in setting of diarrhea 09/21/2013; b. not on long term full dose anticoagulation; c. CHADSVASC at least 4 (CHF, HTN, DM, female)  . Diabetes mellitus (HCC)   . Peritoneal dialysis status (HCC) 4- 15-15  . Ventral hernia   . Inguinal hernia   . History of stress test     a. 2014: no convincing evidence of pharmacologically induced ischemia,  apparent ant apical defect present only on attenuation corrected  images favored to reflect artifact due to overcorrection & may be worse on stress imaging secondary to greater patient motion. A small area of ischemia is felt less likely but is difficult to entirely exclude, EF 55%  . ESRD (end stage renal disease) on dialysis (HCC)     a. HD on T,T,S; b. 2/2 focal segmental glomerulosclerosis   . Dialysis patient (HCC)   . Renal insufficiency   . Stroke [facility]) 11/2015    a. MRI L cereblal infarctions in the MCA/PCA, ACA/MCA territory; b. felt to be carotid in etiology; c. medically managed on DAPT per neuro    Patient Active Problem List   Diagnosis Date Noted  . Acute blood loss anemia 02/18/2016  . Upper GI bleed 02/06/2016  . Aphasia   . Confusion   . Mitral valve stenosis   . Congestive dilated cardiomyopathy (HCC)   . Carotid stenosis   . 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 ESRD (end stage renal disease) on dialysis (HCC) 12/11/2015  . TIA (transient ischemic attack) 12/11/2015  . Acute CVA (cerebrovascular accident) (HCC)   . NSTEMI (non-ST elevated myocardial infarction) (HCC)   . Syncope 11/20/2015  . Carotid artery narrowing   . Cerebral infarction due to unspecified mechanism   . Nosebleed 11/13/2015  . Chronic combined systolic and diastolic CHF (congestive heart failure) (HCC)   . Hospital discharge follow-up 06/28/2015  . Fatty liver 06/22/2015  . Nausea & vomiting 06/22/2015  . SBP (spontaneous bacterial peritonitis) (HCC) 06/22/2015  . Chest pain 06/15/2015  .  TMJ (sprain of temporomandibular joint) 05/21/2015  . Postmenopausal atrophic vaginitis 06/19/2014  . Allergic rhinitis 05/29/2014  . Bilateral leg edema 05/15/2014  . Awaiting organ transplant 12/23/2013  . Hyperparathyroidism due to renal insufficiency (HCC) 12/23/2013  . Paroxysmal atrial fibrillation (HCC) 09/25/2013  . GERD (gastroesophageal reflux disease) 08/12/2013  . Focal and segmental hyalinosis 08/08/2013  . Mitral stenosis with  incompetence or regurgitation 07/19/2013  . Anemia in chronic kidney disease 07/19/2013  . Chronic kidney disease with peritoneal dialysis as preferred modality, stage 5 (HCC) 07/14/2013  . Tobacco abuse counseling 12/20/2012  . Vitamin D deficiency 07/22/2012  . Hyperlipidemia   . Hypertension 07/21/2012  . Family history of colon cancer 07/21/2012  . History of tobacco abuse 07/21/2012    Past Surgical History  Procedure Laterality Date  . Abdominal hysterectomy    . Cholecystectomy    . Knee surgery Right   . Foot surgery Bilateral   . Av fistula placement Left 10-26-13    Dr Gilda Crease  . Peritoneal catheter insertion  03-29-14    Dr Gilda Crease  . Inguinal hernia repair Left 07/13/2015    Procedure: HERNIA REPAIR INGUINAL ADULT;  Surgeon: Earline Mayotte, MD;  Location: [facility] ORS;  Service: General;  Laterality: Left;  Marland Kitchen Ventral hernia repair N/A 07/13/2015    Procedure: HERNIA REPAIR VENTRAL ADULT;  Surgeon: Earline Mayotte, MD;  Location: [facility] ORS;  Service: General;  Laterality: N/A;    Current Outpatient Rx  Name  Route  Sig  Dispense  Refill  . acetaminophen (TYLENOL) 325 MG tablet   Oral   Take 650 mg by mouth every 4 (four) hours as needed for mild pain or headache.          . albuterol (PROVENTIL HFA;VENTOLIN HFA) 108 (90 Base) MCG/ACT inhaler   Inhalation   Inhale 2 puffs into the lungs every 6 (six) hours as needed for wheezing or shortness of breath.         Marland Kitchen aspirin 81 MG chewable tablet   Oral   Chew 81 mg by mouth daily.         Marland Kitchen atorvastatin (LIPITOR) 80 MG tablet   Oral   Take 80 mg by mouth daily.          . calcitRIOL (ROCALTROL) 0.5 MCG capsule   Oral   Take 0.5 mcg by mouth daily.          . calcium acetate (PHOSLO) 667 MG capsule   Oral   Take 2,001 mg by mouth 3 (three) times daily with meals.         . cinacalcet (SENSIPAR) 30 MG tablet   Oral   Take 30 mg by mouth daily.         Marland Kitchen docusate sodium (COLACE) 100 MG capsule    Oral   Take 100 mg by mouth daily as needed for mild constipation.          . ondansetron (ZOFRAN-ODT) 4 MG disintegrating tablet   Oral   Take 4 mg by mouth every 8 (eight) hours as needed for nausea or vomiting.          . pantoprazole (PROTONIX) 40 MG tablet   Oral   Take 40 mg by mouth 2 (two) times daily.          . sodium chloride 1 g tablet   Oral   Take 2 g by mouth 2 (two) times daily with a meal.         .  sucralfate (CARAFATE) 1 GM/10ML suspension   Oral   Take 10 mLs (1 g total) by mouth every 6 (six) hours.   420 mL   0   . tiotropium (SPIRIVA) 18 MCG inhalation capsule   Inhalation   Place 1 capsule (18 mcg total) into inhaler and inhale daily.   30 capsule   0     Allergies Buprenorphine; Ciprocinonide; Demerol; Diflucan; Flagyl; Hydrocodone; Levaquin; Morphine and related; Tetracyclines & related; and Valium  Family History  Problem Relation Age of Onset  . Cancer Mother     colon  . Heart disease Father   . Hypertension Father     Social History Social History  Substance Use Topics  . Smoking status: Former Smoker -- 0.00 packs/day for 0 years  . Smokeless tobacco: Never Used  . Alcohol Use: No     Comment: occasional    Review of Systems Constitutional: No fever/chills Eyes: No visual changes. ENT: No sore throat. Cardiovascular: + chest pain. Respiratory: Denies shortness of breath. Gastrointestinal: No abdominal pain.  No nausea, no vomiting.  No diarrhea.  No constipation. Genitourinary: Negative for dysuria. Musculoskeletal: Negative for back pain. Skin: Negative for rash. Neurological: Negative for headaches, focal weakness or numbness.  10-point ROS otherwise negative.  ____________________________________________   PHYSICAL EXAM:  VITAL SIGNS: ED Triage Vitals  Enc Vitals Group     BP 03/04/16 1828 118/85 mmHg     Pulse Rate 03/04/16 1828 88     Resp 03/04/16 1828 16     Temp 03/04/16 1828 98.2 F (36.8 C)      Temp Source 03/04/16 1828 Oral     SpO2 03/04/16 1828 99 %     Weight 03/04/16 1828 115 lb (52.164 kg)     Height 03/04/16 1828 5\' 10"  (1.778 m)     Head Cir --      Peak Flow --      Pain Score 03/04/16 1836 10     Pain Loc --      Pain Edu? --      Excl. in GC? --     Constitutional: Alert and oriented. Fatigued-appearing and in no acute distress. Eyes: Conjunctivae are normal. PERRL. EOMI. Head: Atraumatic. Nose: No congestion/rhinnorhea. Mouth/Throat: Mucous membranes are moist.  Oropharynx non-erythematous. Neck: No stridor.  Supple without meningismus. Cardiovascular: Normal rate, regular rhythm. Grossly normal heart sounds.  Good peripheral circulation. Respiratory: Normal respiratory effort.  No retractions. Decreased breath sounds in the right base. Gastrointestinal: Soft and nontender. No distention.  No CVA tenderness. Genitourinary: deferred Musculoskeletal: 3+ pitting edema of the right lower extremity. Neurologic:  No dysarthria, intermittent word finding difficulties which are chronic. Chronic weakness in the right upper and right lower extremity with decreased sensation to light touch. Intact strength and sensation in the left upper and lower extremity. Skin:  Skin is warm, dry and intact. No rash noted. Psychiatric: Mood and affect are normal. Speech and behavior are normal.  ____________________________________________   LABS (all labs ordered are listed, but only abnormal results are displayed)  Labs Reviewed  BASIC METABOLIC PANEL - Abnormal; Notable for the following:    Sodium 132 (*)    Chloride 96 (*)    BUN 53 (*)    Creatinine, Ser 8.09 (*)    Calcium 8.2 (*)    GFR calc non Af Amer 5 (*)    GFR calc Af Amer 6 (*)    All other components within normal limits  CBC - Abnormal;  Notable for the following:    RBC 3.33 (*)    Hemoglobin 9.3 (*)    HCT 28.1 (*)    RDW 16.6 (*)    All other components within normal limits  TROPONIN I - Abnormal;  Notable for the following:    Troponin I 0.06 (*)    All other components within normal limits  TROPONIN I  TROPONIN I  TROPONIN I   ____________________________________________  EKG  ED ECG REPORT I, Gayla Doss, the attending physician, personally viewed and interpreted this ECG.   Date: 03/04/2016  EKG Time: 18:40  Rate: 89  Rhythm: normal sinus rhythm  Axis: normal  Intervals:right bundle branch block  ST&T Change: No acute ST elevation. QTC is slightly prolonged at 511 ms.  ____________________________________________  RADIOLOGY  CXR IMPRESSION: Cardiac enlargement with central pulmonary vascular congestion. Increasing right pleural effusion with basilar infiltration or Atelectasis.  Venous doppler ultrasound right leg IMPRESSION: No evidence of deep venous thrombosis. ____________________________________________   PROCEDURES  Procedure(s) performed: None  Critical Care performed: No  ____________________________________________   INITIAL IMPRESSION / ASSESSMENT AND PLAN / ED COURSE  Pertinent labs & imaging results that were available during my care of the patient were reviewed by me and considered in my medical decision making (see chart for details).  JAIDIN UGARTE is a 55 y.o. 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 Labs reviewed. Creatinine is elevated as expected in the setting of known end-stage renal disease. CBC with mild anemia. Troponin elevated 0.06 however this appears improved from prior and is likely secondary to her renal disease. chest x-ray shows increasing right pleural effusion. Venous Doppler ultrasound of the right leg is negative for DVT so will not obtain CTA chest at this time. I discussed the case with Dr. Cherylann Ratel  of nephrology and patient will be admitted for more aggressive diuresis as well as possible thoracentesis to be performed by interventional radiology. Case discussed with the hospitalist, Dr. Anne Hahn, for admission at 9:20 PM.    ____________________________________________   FINAL CLINICAL IMPRESSION(S) / ED DIAGNOSES  Final diagnoses:  Chest pain, unspecified chest pain type  Pleural effusion  Right leg swelling      Gayla Doss, MD 03/05/16 [phone]

## 2016-03-04 NOTE — ED Notes (Signed)
Dr. Gayle at bedside  

## 2016-03-05 ENCOUNTER — Observation Stay: Payer: BC Managed Care – PPO

## 2016-03-05 DIAGNOSIS — E785 Hyperlipidemia, unspecified: Secondary | ICD-10-CM | POA: Diagnosis not present

## 2016-03-05 DIAGNOSIS — I5042 Chronic combined systolic (congestive) and diastolic (congestive) heart failure: Secondary | ICD-10-CM

## 2016-03-05 DIAGNOSIS — R079 Chest pain, unspecified: Secondary | ICD-10-CM | POA: Diagnosis not present

## 2016-03-05 LAB — BASIC METABOLIC PANEL
Anion gap: 9 (ref 5–15)
BUN: 54 mg/dL — ABNORMAL HIGH (ref 6–20)
CALCIUM: 8 mg/dL — AB (ref 8.9–10.3)
CO2: 22 mmol/L (ref 22–32)
Chloride: 100 mmol/L — ABNORMAL LOW (ref 101–111)
Creatinine, Ser: 8.39 mg/dL — ABNORMAL HIGH (ref 0.44–1.00)
GFR calc Af Amer: 6 mL/min — ABNORMAL LOW (ref >60)
GFR, EST NON AFRICAN AMERICAN: 5 mL/min — AB (ref >60)
GLUCOSE: 81 mg/dL (ref 65–99)
Potassium: 3.7 mmol/L (ref 3.5–5.1)
SODIUM: 131 mmol/L — AB (ref 135–145)

## 2016-03-05 LAB — CBC
HEMATOCRIT: 24.9 % — AB (ref 35.0–47.0)
HEMOGLOBIN: 8.1 g/dL — AB (ref 12.0–16.0)
MCH: 27.5 pg (ref 26.0–34.0)
MCHC: 32.7 g/dL (ref 32.0–36.0)
MCV: 84.1 fL (ref 80.0–100.0)
Platelets: 147 10*3/uL — ABNORMAL LOW (ref 150–440)
RBC: 2.96 MIL/uL — ABNORMAL LOW (ref 3.80–5.20)
RDW: 16.2 % — AB (ref 11.5–14.5)
WBC: 4.4 10*3/uL (ref 3.6–11.0)

## 2016-03-05 LAB — TROPONIN I
TROPONIN I: 0.04 ng/mL — AB (ref <0.031)
TROPONIN I: 0.05 ng/mL — AB (ref <0.031)
TROPONIN I: 0.05 ng/mL — AB (ref <0.031)

## 2016-03-05 MED ORDER — ACETAMINOPHEN 325 MG PO TABS
650.0000 mg | ORAL_TABLET | Freq: Four times a day (QID) | ORAL | Status: DC | PRN
  Administered 2016-03-05: 650 mg via ORAL
  Filled 2016-03-05: qty 2

## 2016-03-05 MED ORDER — SODIUM CHLORIDE 0.9% FLUSH
3.0000 mL | Freq: Two times a day (BID) | INTRAVENOUS | Status: DC
  Administered 2016-03-05 – 2016-03-11 (×14): 3 mL via INTRAVENOUS

## 2016-03-05 MED ORDER — MORPHINE SULFATE (PF) 2 MG/ML IV SOLN
2.0000 mg | INTRAVENOUS | Status: DC | PRN
  Administered 2016-03-05 – 2016-03-11 (×17): 2 mg via INTRAVENOUS
  Filled 2016-03-05 (×17): qty 1

## 2016-03-05 MED ORDER — HEPARIN SODIUM (PORCINE) 5000 UNIT/ML IJ SOLN
5000.0000 [IU] | Freq: Three times a day (TID) | INTRAMUSCULAR | Status: DC
Start: 2016-03-05 — End: 2016-03-11
  Administered 2016-03-05 – 2016-03-11 (×17): 5000 [IU] via SUBCUTANEOUS
  Filled 2016-03-05 (×18): qty 1

## 2016-03-05 MED ORDER — CALCITRIOL 0.25 MCG PO CAPS
0.5000 ug | ORAL_CAPSULE | Freq: Every day | ORAL | Status: DC
  Administered 2016-03-05 – 2016-03-11 (×7): 0.5 ug via ORAL
  Filled 2016-03-05 (×2): qty 2
  Filled 2016-03-05: qty 1
  Filled 2016-03-05 (×2): qty 2
  Filled 2016-03-05: qty 1
  Filled 2016-03-05 (×3): qty 2

## 2016-03-05 MED ORDER — CALCIUM ACETATE (PHOS BINDER) 667 MG PO CAPS
2001.0000 mg | ORAL_CAPSULE | Freq: Three times a day (TID) | ORAL | Status: DC
  Administered 2016-03-05 – 2016-03-11 (×15): 2001 mg via ORAL
  Filled 2016-03-05 (×15): qty 3

## 2016-03-05 MED ORDER — DOCUSATE SODIUM 100 MG PO CAPS: 100.0000 mg | ORAL_CAPSULE | Freq: Every day | ORAL | Status: DC | PRN

## 2016-03-05 MED ORDER — TIOTROPIUM BROMIDE MONOHYDRATE 18 MCG IN CAPS
18.0000 ug | ORAL_CAPSULE | Freq: Every day | RESPIRATORY_TRACT | Status: DC
  Administered 2016-03-05 – 2016-03-11 (×6): 18 ug via RESPIRATORY_TRACT
  Filled 2016-03-05 (×2): qty 5

## 2016-03-05 MED ORDER — OXYCODONE-ACETAMINOPHEN 5-325 MG PO TABS
1.0000 | ORAL_TABLET | Freq: Four times a day (QID) | ORAL | Status: DC | PRN
  Administered 2016-03-05 – 2016-03-06 (×2): 1 via ORAL
  Filled 2016-03-05 (×4): qty 1

## 2016-03-05 MED ORDER — ACETAMINOPHEN 650 MG RE SUPP: 650.0000 mg | Freq: Four times a day (QID) | RECTAL | Status: DC | PRN

## 2016-03-05 MED ORDER — ONDANSETRON HCL 4 MG PO TABS: 4.0000 mg | ORAL_TABLET | Freq: Four times a day (QID) | ORAL | Status: DC | PRN

## 2016-03-05 MED ORDER — ASPIRIN 81 MG PO CHEW
81.0000 mg | CHEWABLE_TABLET | Freq: Every day | ORAL | Status: DC
  Administered 2016-03-05 – 2016-03-11 (×7): 81 mg via ORAL
  Filled 2016-03-05 (×7): qty 1

## 2016-03-05 MED ORDER — ATORVASTATIN CALCIUM 20 MG PO TABS
80.0000 mg | ORAL_TABLET | Freq: Every day | ORAL | Status: DC
  Administered 2016-03-05 – 2016-03-11 (×7): 80 mg via ORAL
  Filled 2016-03-05 (×7): qty 4

## 2016-03-05 MED ORDER — ONDANSETRON HCL 4 MG/2ML IJ SOLN
4.0000 mg | Freq: Four times a day (QID) | INTRAMUSCULAR | Status: DC | PRN
  Administered 2016-03-06: 4 mg via INTRAVENOUS
  Filled 2016-03-05 (×2): qty 2

## 2016-03-05 MED ORDER — CINACALCET HCL 30 MG PO TABS
30.0000 mg | ORAL_TABLET | Freq: Every day | ORAL | Status: DC
  Administered 2016-03-05 – 2016-03-11 (×7): 30 mg via ORAL
  Filled 2016-03-05 (×8): qty 1

## 2016-03-05 MED ORDER — PANTOPRAZOLE SODIUM 40 MG PO TBEC
40.0000 mg | DELAYED_RELEASE_TABLET | Freq: Two times a day (BID) | ORAL | Status: DC
  Administered 2016-03-05 – 2016-03-11 (×14): 40 mg via ORAL
  Filled 2016-03-05 (×14): qty 1

## 2016-03-05 NOTE — Consult Note (Signed)
[facility]      Assessment and Plan:  Pleural effusion.  -Likely multifactorial from congestive heart failure and renal failure with volume overload.  -We will send for a thoracentesis to be sent for usual testing, and addition to flow cytometry. -As the patient is minimally symptomatic this can be done inpatient, or can be deferred until the outpatient setting.  -Discussed with pt's husband, explained the likely etiology of the fluid, and that it may recur. Explained that she may have some pain after the thoracentesis due to drainage of the fluid, and that the more important reason to drain the fluid at this time is to rule out any other etiology which could be causing the fluid such as infection or cancer. He is in agreement and proceeding with a thoracentesis, he thinks that she may need some sedation to undergo the procedure  Chest Pain.  -Doubt pleurisy, her chest pain appears predominantly central, she also complains of point tenderness all over the chest wall. There is no pleuritic type chest pain.   Systolic Congestive heart failure. Known history of moderate to severe mitral stenosis. -May be contributing to pleural effusion.  ESRD with history of FSGS.  -Currently on PD.   Pulmonary hypertension. -Likely secondary to above etiologies.  Date: 03/05/2016  MRN# [id number] Romilda GarretDonnella R Shukla 1961/01/29  Referring Physician: Dr. Kennieth RadWillis  Margerie R Melching is a 55 y.o. old female seen in consultation for chief complaint of: dyspnea   Chief Complaint  Patient presents with  . Leg Pain  . Chest Pain    HPI:  The patient is a 55 yo female, recently discharged from the hospital  On 02/12/16 with GI bleeding, anemia. She has a known history of COPD, systolic CHF, Mitral stenosis.  She presents now with chest pain, predominantly in her central chest. She has significant aphasia, therefore, is not able to provide a history. Therefore, all history was  obtained from the chart and from staff, as well as the patient's husband my contacted by telephone.  Reviewed CT chest images and report from 03/05/2016, and compared with previous CT of the chest from 09/07/2016. There appears to be an unchanged free-flowing moderate to large right-sided pleural effusion. In addition, there is biatrial enlargement and pulmonary arterial tree enlargement consistent with pulmonary hypertension. There is also RV enlargement consistent with this as well.  PMHX:   Past Medical History  Diagnosis Date  . Hypertension   . Hyperlipidemia   . COPD (chronic obstructive pulmonary disease) (HCC)     a. not on home O2  . Anemia   . Valvular disease     a. 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 echo 2015: EF 45-50%, borderline LVH, mod dilated LA, mildly dilated RA, small pericardial effusion, mod MR (rheumatic deformity), MS mild-mod, no gradient, PASP ele  . Chronic combined systolic and diastolic CHF (congestive heart failure) (HCC)     a. echo reports above  . PAF (paroxysmal atrial fibrillation) (HCC)     a. in setting of diarrhea 09/21/2013; b. not on long term full dose anticoagulation; c. CHADSVASC at least 4 (CHF, HTN, DM, female)  . Diabetes mellitus (HCC)   . Peritoneal dialysis status (HCC) 4- 15-15  . Ventral hernia   . Inguinal hernia   . History of stress  test     a. 2014: no convincing evidence of pharmacologically induced ischemia,  apparent ant apical defect present only on attenuation corrected images favored to reflect artifact due to overcorrection & may be worse on stress imaging secondary to greater patient motion. A small area of ischemia is felt less likely but is difficult to entirely exclude, EF 55%  . ESRD (end stage renal disease) on dialysis (HCC)     a. HD on  T,T,S; b. 2/2 focal segmental glomerulosclerosis   . Dialysis patient (HCC)   . Renal insufficiency   . Stroke [facility]) 11/2015    a. MRI L cereblal infarctions in the MCA/PCA, ACA/MCA territory; b. felt to be carotid in etiology; c. medically managed on DAPT per neuro   Surgical Hx:  Past Surgical History  Procedure Laterality Date  . Abdominal hysterectomy    . Cholecystectomy    . Knee surgery Right   . Foot surgery Bilateral   . Av fistula placement Left 10-26-13    Dr Gilda Crease  . Peritoneal catheter insertion  03-29-14    Dr Gilda Crease  . Inguinal hernia repair Left 07/13/2015    Procedure: HERNIA REPAIR INGUINAL ADULT;  Surgeon: Earline Mayotte, MD;  Location: [facility] ORS;  Service: General;  Laterality: Left;  Marland Kitchen Ventral hernia repair N/A 07/13/2015    Procedure: HERNIA REPAIR VENTRAL ADULT;  Surgeon: Earline Mayotte, MD;  Location: [facility] ORS;  Service: General;  Laterality: N/A;   Family Hx:  Family History  Problem Relation Age of Onset  . Cancer Mother     colon  . Heart disease Father   . Hypertension Father    Social Hx:   Social History  Substance Use Topics  . Smoking status: Former Smoker -- 0.00 packs/day for 0 years  . Smokeless tobacco: Never Used  . Alcohol Use: No     Comment: occasional   Medication:   No current outpatient prescriptions on file.    Allergies:  Buprenorphine; Ciprocinonide; Demerol; Diflucan; Flagyl; Hydrocodone; Levaquin; Morphine and related; Tetracyclines & related; and Valium  Review of Systems: Could not obtain review of systems due to aphasia.  Physical Examination:   VS: BP 118/82 mmHg  Pulse 74  Temp(Src) 97.9 F (36.6 C) (Oral)  Resp 16  Ht  (1.778 m)  Wt 110 lb 8 oz (50.122 kg)  BMI 15.85 kg/m2  SpO2 96%  General Appearance: No distress  Neuro: speech  is a phasic. HEENT: PERRLA, EOM intact.   Pulmonary: normal breath sounds, No wheezing.  CardiovascularNormal S1,S2.  No m/r/g.   Abdomen: Benign, Soft,  non-tender. Renal:  No costovertebral tenderness  GU:  No performed at this time. Endoc: No evident thyromegaly, no signs of acromegaly. Skin:   warm, no rashes, no ecchymosis  Extremities: normal, no cyanosis, clubbing.  Other findings:    LABORATORY PANEL:   CBC  Recent Labs Lab 03/05/16 0413  WBC 4.4  HGB 8.1*  HCT 24.9*  PLT 147*   ------------------------------------------------------------------------------------------------------------------  Chemistries   Recent Labs Lab 03/05/16 0413  NA 131*  K 3.7  CL 100*  CO2 22  GLUCOSE 81  BUN 54*  CREATININE 8.39*  CALCIUM 8.0*   ------------------------------------------------------------------------------------------------------------------  Cardiac Enzymes  Recent Labs Lab 03/05/16 1235  TROPONINI 0.05*   ------------------------------------------------------------  RADIOLOGY:  Dg Chest 2 View  03/04/2016  CLINICAL DATA:  Right lower leg pain and swelling for 5 days. Right-sided chest pain starting yesterday. EXAM: CHEST  2 VIEW COMPARISON:  12/18/2015 FINDINGS: Cardiac enlargement with mild central pulmonary vascular congestion suggesting arterial hypertension. Calcification projected over the heart corresponding calcification in the mitral valve annulus on prior CT. Mediastinal contours appear intact. There is increasing right pleural effusion with basilar atelectasis or consolidation in the right lung since the previous study. No pneumothorax. Vena caval filter and surgical clips demonstrated in the right upper quadrant. IMPRESSION: Cardiac enlargement with central pulmonary vascular congestion. Increasing right pleural effusion with basilar infiltration or atelectasis. Electronically Signed   By: Burman Nieves M.D.   On: 03/04/2016 19:40   US Venous Img Lower Unilateral Right  03/04/2016  CLINICAL DATA:  Pain and swelling in the right leg for 2 weeks. Previous history of deep venous thrombosis. Aspirin  anticoagulation therapy. EXAM: Right LOWER EXTREMITY VENOUS DOPPLER ULTRASOUND TECHNIQUE: Gray-scale sonography with graded compression, as well as color Doppler and duplex ultrasound were performed to evaluate the lower extremity deep venous systems from the level of the common femoral vein and including the common femoral, femoral, profunda femoral, popliteal and calf veins including the posterior tibial, peroneal and gastrocnemius veins when visible. The superficial great saphenous vein was also interrogated. Spectral Doppler was utilized to evaluate flow at rest and with distal augmentation maneuvers in the common femoral, femoral and popliteal veins. COMPARISON:  None. FINDINGS: Contralateral Common Femoral Vein: Respiratory phasicity is normal and symmetric with the symptomatic side. No evidence of thrombus. Normal compressibility. Common Femoral Vein: No evidence of thrombus. Normal compressibility, respiratory phasicity and response to augmentation. Saphenofemoral Junction: No evidence of thrombus. Normal compressibility and flow on color Doppler imaging. Profunda Femoral Vein: No evidence of thrombus. Normal compressibility and flow on color Doppler imaging. Femoral Vein: No evidence of thrombus. Normal compressibility, respiratory phasicity and response to augmentation. Popliteal Vein: No evidence of thrombus. Normal compressibility, respiratory phasicity and response to augmentation. Calf Veins: No evidence of thrombus. Normal compressibility and flow on color Doppler imaging. Superficial Great Saphenous Vein: No evidence of thrombus. Normal compressibility and flow on color Doppler imaging. Venous Reflux:  None. Other Findings:  None. IMPRESSION: No evidence of deep venous thrombosis. Electronically Signed   By: Burman Nieves M.D.   On: 03/04/2016 19:31      Thank  you for the consultation and for allowing [facility] to assist in the care of your patient. Our  recommendations are noted above.  Please contact us if we can be of further service.   Wells Guiles, MD.  Board Certified in Internal Medicine, Pulmonary Medicine, Critical Care Medicine, and Sleep Medicine.  [facility]  Santiago Glad, M.D.  Stephanie Acre, M.D.  Billy Fischer, M.D

## 2016-03-05 NOTE — Progress Notes (Signed)
Pt complaint of 10/10 chest pain. MD notified. Orders for percocet and MD also wanted cardiologist consulted for severe vascular disease. I will continue to assess.

## 2016-03-05 NOTE — Care Management Obs Status (Signed)
MEDICARE OBSERVATION STATUS NOTIFICATION   Patient Details  Name: Dorothy GarretDonnella R Peavler MRN: [id number] Date of Birth: Jul 12, 1961   Medicare Observation Status Notification Given:  Yes    Marily MemosLisa M Nasha Diss, RN 03/05/2016, 9:49 AM

## 2016-03-05 NOTE — Progress Notes (Signed)
[facility]  ROUNDING NOTE   Subjective:  Patient well-known to us as we follow her for outpatient peritoneal dialysis. She presents now with right-sided chest pain. She was found to have increasing right pleural effusion. Minimal elevations in troponin.    Objective:  Vital signs in last 24 hours:  Temp:  [97.9 F (36.6 C)-98.2 F (36.8 C)] 97.9 F (36.6 C) (03/22 1226) Pulse Rate:  [74-92] 74 (03/22 1226) Resp:  [13-20] 16 (03/22 1226) BP: (109-127)/(82-96) 118/82 mmHg (03/22 1226) SpO2:  [95 %-100 %] 96 % (03/22 1226) Weight:  [50.122 kg (110 lb 8 oz)-52.164 kg (115 lb)] 50.122 kg (110 lb 8 oz) (03/22 0211)  Weight change:  Filed Weights   03/04/16 1828 03/05/16 0211  Weight: 52.164 kg (115 lb) 50.122 kg (110 lb 8 oz)    Intake/Output:     Intake/Output this shift:  Total I/O In: 120 [P.O.:120] Out: -   Physical Exam: General: NAD, resting in bed  Head: Normocephalic, atraumatic. Moist oral mucosal membranes  Eyes: Anicteric, PERRL  Neck: Supple, trachea midline  Lungs:  Diminished breath sounds at right base  Heart: S1S2 no rubs  Abdomen:  Soft, nontender, BS present, PD catheter present   Extremities: R > L  peripheral edema.  Neurologic: Mild right sided weakness  Skin: No lesions  Access: PD catheter in place    Basic Metabolic Panel:  Recent Labs Lab 03/04/16 1843 03/05/16 0413  NA 132* 131*  K 3.7 3.7  CL 96* 100*  CO2 23 22  GLUCOSE 91 81  BUN 53* 54*  CREATININE 8.09* 8.39*  CALCIUM 8.2* 8.0*    Liver Function Tests: No results for input(s): AST, ALT, ALKPHOS, BILITOT, PROT, ALBUMIN in the last 168 hours. No results for input(s): LIPASE, AMYLASE in the last 168 hours. No results for input(s): AMMONIA in the last 168 hours.  CBC:  Recent Labs Lab 03/04/16 1843 03/05/16 0413  WBC 5.5 4.4  HGB 9.3* 8.1*  HCT 28.1* 24.9*  MCV 84.3 84.1  PLT 177 147*    Cardiac Enzymes:  Recent Labs Lab 03/04/16 1843  03/05/16 0035 03/05/16 0617 03/05/16 1235  TROPONINI 0.06* 0.05* 0.04* 0.05*    BNP: Invalid input(s): POCBNP  CBG: No results for input(s): GLUCAP in the last 168 hours.  Microbiology: Results for orders placed or performed during the hospital encounter of 02/06/16  MRSA PCR Screening     Status: None   Collection Time: 02/06/16 11:22 PM  Result Value Ref Range Status   MRSA by PCR NEGATIVE NEGATIVE Final    Comment:        The GeneXpert MRSA Assay (FDA approved for NASAL specimens only), is one component of a comprehensive MRSA colonization surveillance program. It is not intended to diagnose MRSA infection nor to guide or monitor treatment for MRSA infections.     Coagulation Studies: No results for input(s): LABPROT, INR in the last 72 hours.  Urinalysis: No results for input(s): COLORURINE, LABSPEC, PHURINE, GLUCOSEU, HGBUR, BILIRUBINUR, KETONESUR, PROTEINUR, UROBILINOGEN, NITRITE, LEUKOCYTESUR in the last 72 hours.  Invalid input(s): APPERANCEUR    Imaging: Dg Chest 2 View  03/04/2016  CLINICAL DATA:  Right lower leg pain and swelling for 5 days. Right-sided chest pain starting yesterday. EXAM: CHEST  2 VIEW COMPARISON:  12/18/2015 FINDINGS: Cardiac enlargement with mild central pulmonary vascular congestion suggesting arterial hypertension. Calcification projected over the heart corresponding calcification in the mitral valve annulus on prior CT. Mediastinal contours appear intact. There is  increasing right pleural effusion with basilar atelectasis or consolidation in the right lung since the previous study. No pneumothorax. Vena caval filter and surgical clips demonstrated in the right upper quadrant. IMPRESSION: Cardiac enlargement with central pulmonary vascular congestion. Increasing right pleural effusion with basilar infiltration or atelectasis. Electronically Signed   By: Burman Nieves M.D.   On: 03/04/2016 19:40   US Venous Img Lower Unilateral  Right  03/04/2016  CLINICAL DATA:  Pain and swelling in the right leg for 2 weeks. Previous history of deep venous thrombosis. Aspirin anticoagulation therapy. EXAM: Right LOWER EXTREMITY VENOUS DOPPLER ULTRASOUND TECHNIQUE: Gray-scale sonography with graded compression, as well as color Doppler and duplex ultrasound were performed to evaluate the lower extremity deep venous systems from the level of the common femoral vein and including the common femoral, femoral, profunda femoral, popliteal and calf veins including the posterior tibial, peroneal and gastrocnemius veins when visible. The superficial great saphenous vein was also interrogated. Spectral Doppler was utilized to evaluate flow at rest and with distal augmentation maneuvers in the common femoral, femoral and popliteal veins. COMPARISON:  None. FINDINGS: Contralateral Common Femoral Vein: Respiratory phasicity is normal and symmetric with the symptomatic side. No evidence of thrombus. Normal compressibility. Common Femoral Vein: No evidence of thrombus. Normal compressibility, respiratory phasicity and response to augmentation. Saphenofemoral Junction: No evidence of thrombus. Normal compressibility and flow on color Doppler imaging. Profunda Femoral Vein: No evidence of thrombus. Normal compressibility and flow on color Doppler imaging. Femoral Vein: No evidence of thrombus. Normal compressibility, respiratory phasicity and response to augmentation. Popliteal Vein: No evidence of thrombus. Normal compressibility, respiratory phasicity and response to augmentation. Calf Veins: No evidence of thrombus. Normal compressibility and flow on color Doppler imaging. Superficial Great Saphenous Vein: No evidence of thrombus. Normal compressibility and flow on color Doppler imaging. Venous Reflux:  None. Other Findings:  None. IMPRESSION: No evidence of deep venous thrombosis. Electronically Signed   By: Burman Nieves M.D.   On: 03/04/2016 19:31      Medications:     . aspirin  81 mg Oral Daily  . atorvastatin  80 mg Oral Daily  . calcitRIOL  0.5 mcg Oral Daily  . calcium acetate  2,001 mg Oral TID WC  . cinacalcet  30 mg Oral Daily  . heparin  5,000 Units Subcutaneous 3 times per day  . pantoprazole  40 mg Oral BID  . sodium chloride flush  3 mL Intravenous Q12H  . tiotropium  18 mcg Inhalation Daily   acetaminophen **OR** acetaminophen, docusate sodium, morphine injection, ondansetron **OR** ondansetron (ZOFRAN) IV  Assessment/ Plan:  54 y.o. female with history of end-stage renal disease (FSGS) currently on PD, paroxysmal atrial fibrillation, COPD, cirrhosis, stroke with right-sided weakness 2017,severe mitral stenosis,depression, thrombocytopenia, and negative anticardiolipin antibody, weakly positive ANA, acute GI bleed in January 2017 caused by severe esophagitis and bleeding rectal ulcer, IVC filter, pulmonary arterial hypertensionm, right-sided heart dysfunction consistent with cor pulmonale, significant venous engorgement of the suprahepatic IVC and hepatic veins suspicious for congestive hepatopathy and cardiac cirrhosis.  1.  End-stage renal disease on peritoneal dialysis.  We will resume peritoneal dialysis tonight.  6 exchanges, fill volume 1600 cc, use 1.5% dextrose solution.  2.  Right pleural effusion. This appears to be recurrent in nature.  Consider pulmonary consultation.  3.  Secondary hyperparathyroidism.  Continue PhosLo, calcitriol, and Sensipar.  4.  Anemia chronic kidney disease.given relatively recent CVA we would recommend avoiding Epogen.  This was discussed with the  patient as an outpatient.  .  Thanks for consultation.       LOS:  Dniya Neuhaus 3/22/20172:39 PM

## 2016-03-06 ENCOUNTER — Observation Stay: Payer: BC Managed Care – PPO

## 2016-03-06 ENCOUNTER — Telehealth: Payer: Self-pay | Admitting: Cardiovascular Disease

## 2016-03-06 DIAGNOSIS — I5042 Chronic combined systolic (congestive) and diastolic (congestive) heart failure: Secondary | ICD-10-CM | POA: Diagnosis not present

## 2016-03-06 DIAGNOSIS — J90 Pleural effusion, not elsewhere classified: Secondary | ICD-10-CM | POA: Insufficient documentation

## 2016-03-06 DIAGNOSIS — J948 Other specified pleural conditions: Secondary | ICD-10-CM

## 2016-03-06 DIAGNOSIS — I050 Rheumatic mitral stenosis: Secondary | ICD-10-CM | POA: Insufficient documentation

## 2016-03-06 DIAGNOSIS — I5022 Chronic systolic (congestive) heart failure: Secondary | ICD-10-CM | POA: Diagnosis not present

## 2016-03-06 DIAGNOSIS — I700 Atherosclerosis of aorta: Secondary | ICD-10-CM | POA: Insufficient documentation

## 2016-03-06 DIAGNOSIS — Z992 Dependence on renal dialysis: Secondary | ICD-10-CM

## 2016-03-06 DIAGNOSIS — N185 Chronic kidney disease, stage 5: Secondary | ICD-10-CM

## 2016-03-06 DIAGNOSIS — I2510 Atherosclerotic heart disease of native coronary artery without angina pectoris: Secondary | ICD-10-CM | POA: Insufficient documentation

## 2016-03-06 DIAGNOSIS — R079 Chest pain, unspecified: Secondary | ICD-10-CM | POA: Diagnosis not present

## 2016-03-06 DIAGNOSIS — R0602 Shortness of breath: Secondary | ICD-10-CM | POA: Insufficient documentation

## 2016-03-06 LAB — COMPREHENSIVE METABOLIC PANEL
ALT: 16 U/L (ref 14–54)
ANION GAP: 9 (ref 5–15)
AST: 20 U/L (ref 15–41)
Albumin: 2.1 g/dL — ABNORMAL LOW (ref 3.5–5.0)
Alkaline Phosphatase: 83 U/L (ref 38–126)
BILIRUBIN TOTAL: 0.5 mg/dL (ref 0.3–1.2)
BUN: 52 mg/dL — AB (ref 6–20)
CO2: 24 mmol/L (ref 22–32)
Calcium: 7.8 mg/dL — ABNORMAL LOW (ref 8.9–10.3)
Chloride: 98 mmol/L — ABNORMAL LOW (ref 101–111)
Creatinine, Ser: 8.18 mg/dL — ABNORMAL HIGH (ref 0.44–1.00)
GFR calc Af Amer: 6 mL/min — ABNORMAL LOW (ref >60)
GFR, EST NON AFRICAN AMERICAN: 5 mL/min — AB (ref >60)
Glucose, Bld: 82 mg/dL (ref 65–99)
POTASSIUM: 3.7 mmol/L (ref 3.5–5.1)
Sodium: 131 mmol/L — ABNORMAL LOW (ref 135–145)
TOTAL PROTEIN: 5.3 g/dL — AB (ref 6.5–8.1)

## 2016-03-06 LAB — BODY FLUID CELL COUNT WITH DIFFERENTIAL
Eos, Fluid: 5 %
LYMPHS FL: 66 %
Monocyte-Macrophage-Serous Fluid: 23 %
Neutrophil Count, Fluid: 6 %
Other Cells, Fluid: 0 %
Total Nucleated Cell Count, Fluid: 32 cu mm

## 2016-03-06 LAB — LACTATE DEHYDROGENASE, PLEURAL OR PERITONEAL FLUID: LD, Fluid: 42 U/L — ABNORMAL HIGH (ref 3–23)

## 2016-03-06 LAB — AMYLASE, BODY FLUID: Amylase, Fluid: 24 U/L

## 2016-03-06 LAB — GLUCOSE, SEROUS FLUID: GLUCOSE FL: 90 mg/dL

## 2016-03-06 LAB — PROTEIN, BODY FLUID

## 2016-03-06 LAB — HEPATITIS B SURFACE ANTIGEN: Hepatitis B Surface Ag: NEGATIVE

## 2016-03-06 MED ORDER — GENTAMICIN SULFATE 0.1 % EX CREA
1.0000 "application " | TOPICAL_CREAM | Freq: Every day | CUTANEOUS | Status: DC
  Administered 2016-03-06 – 2016-03-11 (×5): 1 via TOPICAL
  Filled 2016-03-06: qty 15

## 2016-03-06 MED ORDER — ONDANSETRON HCL 4 MG/2ML IJ SOLN
4.0000 mg | Freq: Once | INTRAMUSCULAR | Status: AC
  Administered 2016-03-06: 4 mg via INTRAVENOUS

## 2016-03-06 MED ORDER — ONDANSETRON HCL 4 MG/2ML IJ SOLN
4.0000 mg | Freq: Four times a day (QID) | INTRAMUSCULAR | Status: DC | PRN
  Administered 2016-03-09: 4 mg via INTRAVENOUS
  Filled 2016-03-06: qty 2

## 2016-03-06 MED ORDER — DELFLEX-LC/1.5% DEXTROSE 346 MOSM/L IP SOLN
INTRAPERITONEAL | Status: DC
  Administered 2016-03-06: 19:00:00 via INTRAPERITONEAL
  Administered 2016-03-08 – 2016-03-09 (×2): 9600 mL via INTRAPERITONEAL
  Filled 2016-03-06: qty 3000

## 2016-03-06 MED ORDER — ONDANSETRON HCL 4 MG PO TABS: 4.0000 mg | ORAL_TABLET | ORAL | Status: DC | PRN

## 2016-03-06 NOTE — Progress Notes (Signed)
[facility]  ROUNDING NOTE   Subjective:  Patient underwent peritoneal dialysis. Tolerating well. Ultrafiltration achieved overnight was 1 kg.    Objective:  Vital signs in last 24 hours:  Temp:  [97.8 F (36.6 C)-98.3 F (36.8 C)] 97.8 F (36.6 C) (03/23 0513) Pulse Rate:  [69-74] 69 (03/23 0513) Resp:  [16-17] 16 (03/23 0513) BP: (113-124)/(82-83) 124/82 mmHg (03/23 0513) SpO2:  [94 %-97 %] 97 % (03/23 0513) Weight:  [49.85 kg (109 lb 14.4 oz)] 49.85 kg (109 lb 14.4 oz) (03/23 0500)  Weight change: -2.313 kg (-5 lb 1.6 oz) Filed Weights   03/04/16 1828 03/05/16 0211 03/06/16 0500  Weight: 52.164 kg (115 lb) 50.122 kg (110 lb 8 oz) 49.85 kg (109 lb 14.4 oz)    Intake/Output: I/O last 3 completed shifts: In: 120 [P.O.:120] Out: -    Intake/Output this shift:  Total I/O In: -  Out: 1068 [Other:1068]  Physical Exam: General: NAD, resting in bed  Head: Normocephalic, atraumatic. Moist oral mucosal membranes  Eyes: Anicteric, PERRL  Neck: Supple, trachea midline  Lungs:  Diminished breath sounds at right base  Heart: S1S2 no rubs  Abdomen:  Soft, nontender, BS present, PD catheter present   Extremities: R > L  peripheral edema.  Neurologic: Mild right sided weakness  Skin: No lesions  Access: PD catheter in place    Basic Metabolic Panel:  Recent Labs Lab 03/04/16 1843 03/05/16 0413 03/06/16 0657  NA 132* 131* 131*  K 3.7 3.7 3.7  CL 96* 100* 98*  CO2 GLUCOSE 91 81 82  BUN 53* 54* 52*  CREATININE 8.09* 8.39* 8.18*  CALCIUM 8.2* 8.0* 7.8*    Liver Function Tests:  Recent Labs Lab 03/06/16 0657  AST 20  ALT 16  ALKPHOS 83  BILITOT 0.5  PROT 5.3*  ALBUMIN 2.1*   No results for input(s): LIPASE, AMYLASE in the last 168 hours. No results for input(s): AMMONIA in the last 168 hours.  CBC:  Recent Labs Lab 03/04/16 1843 03/05/16 0413  WBC 5.5 4.4  HGB 9.3* 8.1*  HCT 28.1* 24.9*  MCV 84.3 84.1  PLT 177 147*     Cardiac Enzymes:  Recent Labs Lab 03/04/16 1843 03/05/16 0035 03/05/16 0617 03/05/16 1235  TROPONINI 0.06* 0.05* 0.04* 0.05*    BNP: Invalid input(s): POCBNP  CBG: No results for input(s): GLUCAP in the last 168 hours.  Microbiology: Results for orders placed or performed during the hospital encounter of 02/06/16  MRSA PCR Screening     Status: None   Collection Time: 02/06/16 11:22 PM  Result Value Ref Range Status   MRSA by PCR NEGATIVE NEGATIVE Final    Comment:        The GeneXpert MRSA Assay (FDA approved for NASAL specimens only), is one component of a comprehensive MRSA colonization surveillance program. It is not intended to diagnose MRSA infection nor to guide or monitor treatment for MRSA infections.     Coagulation Studies: No results for input(s): LABPROT, INR in the last 72 hours.  Urinalysis: No results for input(s): COLORURINE, LABSPEC, PHURINE, GLUCOSEU, HGBUR, BILIRUBINUR, KETONESUR, PROTEINUR, UROBILINOGEN, NITRITE, LEUKOCYTESUR in the last 72 hours.  Invalid input(s): APPERANCEUR    Imaging: Dg Chest 2 View  03/04/2016  CLINICAL DATA:  Right lower leg pain and swelling for 5 days. Right-sided chest pain starting yesterday. EXAM: CHEST  2 VIEW COMPARISON:  12/18/2015 FINDINGS: Cardiac enlargement with mild central pulmonary vascular congestion suggesting arterial hypertension. Calcification  projected over the heart corresponding calcification in the mitral valve annulus on prior CT. Mediastinal contours appear intact. There is increasing right pleural effusion with basilar atelectasis or consolidation in the right lung since the previous study. No pneumothorax. Vena caval filter and surgical clips demonstrated in the right upper quadrant. IMPRESSION: Cardiac enlargement with central pulmonary vascular congestion. Increasing right pleural effusion with basilar infiltration or atelectasis. Electronically Signed   By: Burman Nieves M.D.   On:  03/04/2016 19:40   Ct Chest Wo Contrast  03/05/2016  CLINICAL DATA:  End-stage renal disease with right pleural effusion and chronic congestive heart failure. EXAM: CT CHEST WITHOUT CONTRAST TECHNIQUE: Multidetector CT imaging of the chest was performed following the standard protocol without IV contrast. COMPARISON:  Chest CT February 08, 2016; chest radiograph March 04, 2016 FINDINGS: Mediastinum/Lymph Nodes: Thyroid appears unremarkable. There are subcentimeter mediastinal lymph nodes but no adenopathy by size criteria. There is no appreciable thoracic aortic aneurysm. Visualized great vessels appear unremarkable. There is generalized cardiomegaly with primarily right heart enlargement. There is a small pericardial effusion. There are multiple foci of coronary artery calcification. There is extensive mitral valve calcification. There is prominence of the central pulmonary arteries with rapid peripheral tapering. Lungs/Pleura: There remains a sizable right pleural effusion with atelectatic change in the right lower lobe. There is scarring in the lung bases. There is a rather minimal left pleural effusion with mild left base atelectasis. The somewhat nodular appearing opacity on axial images in the medial left base on prior study appears consistent with localized atelectasis at this time. Upper abdomen: In the visualized upper abdomen, kidneys are atrophic. Liver has a somewhat nodular contour, concerning for a degree of underlying cirrhosis. There is a filter in the inferior vena cava. There is calcification in the visualized abdominal aorta. Gallbladder is absent. Musculoskeletal: Bones are somewhat sclerotic consistent with chronic renal failure. No lytic bone lesions are evident. IMPRESSION: There is a persistent sizable right pleural effusion with right base atelectasis. There is a minimal left pleural effusion with patchy scarring and atelectasis in the left base region. Small pericardial effusion. Changes  indicative of a degree of cor pulmonale, stable. Extensive mitral valve and annulus calcification. There are multiple foci of coronary artery calcification. No adenopathy. Bony changes of chronic renal failure. Filter in the inferior vena cava. Kidneys atrophic. Liver has a somewhat nodular contour, concerning for a degree of underlying hepatic cirrhosis. Electronically Signed   By: Bretta Bang III M.D.   On: 03/05/2016 15:47   US Venous Img Lower Unilateral Right  03/04/2016  CLINICAL DATA:  Pain and swelling in the right leg for 2 weeks. Previous history of deep venous thrombosis. Aspirin anticoagulation therapy. EXAM: Right LOWER EXTREMITY VENOUS DOPPLER ULTRASOUND TECHNIQUE: Gray-scale sonography with graded compression, as well as color Doppler and duplex ultrasound were performed to evaluate the lower extremity deep venous systems from the level of the common femoral vein and including the common femoral, femoral, profunda femoral, popliteal and calf veins including the posterior tibial, peroneal and gastrocnemius veins when visible. The superficial great saphenous vein was also interrogated. Spectral Doppler was utilized to evaluate flow at rest and with distal augmentation maneuvers in the common femoral, femoral and popliteal veins. COMPARISON:  None. FINDINGS: Contralateral Common Femoral Vein: Respiratory phasicity is normal and symmetric with the symptomatic side. No evidence of thrombus. Normal compressibility. Common Femoral Vein: No evidence of thrombus. Normal compressibility, respiratory phasicity and response to augmentation. Saphenofemoral Junction: No evidence of  thrombus. Normal compressibility and flow on color Doppler imaging. Profunda Femoral Vein: No evidence of thrombus. Normal compressibility and flow on color Doppler imaging. Femoral Vein: No evidence of thrombus. Normal compressibility, respiratory phasicity and response to augmentation. Popliteal Vein: No evidence of thrombus.  Normal compressibility, respiratory phasicity and response to augmentation. Calf Veins: No evidence of thrombus. Normal compressibility and flow on color Doppler imaging. Superficial Great Saphenous Vein: No evidence of thrombus. Normal compressibility and flow on color Doppler imaging. Venous Reflux:  None. Other Findings:  None. IMPRESSION: No evidence of deep venous thrombosis. Electronically Signed   By: Burman NievesWilliam  Stevens M.D.   On: 03/04/2016 19:31     Medications:     . aspirin  81 mg Oral Daily  . atorvastatin  80 mg Oral Daily  . calcitRIOL  0.5 mcg Oral Daily  . calcium acetate  2,001 mg Oral TID WC  . cinacalcet  30 mg Oral Daily  . heparin  5,000 Units Subcutaneous 3 times per day  . pantoprazole  40 mg Oral BID  . sodium chloride flush  3 mL Intravenous Q12H  . tiotropium  18 mcg Inhalation Daily   acetaminophen **OR** acetaminophen, docusate sodium, morphine injection, ondansetron **OR** ondansetron (ZOFRAN) IV, oxyCODONE-acetaminophen  Assessment/ Plan:  55 y.o. female with history of end-stage renal disease (FSGS) currently on PD, paroxysmal atrial fibrillation, COPD, cirrhosis, stroke with right-sided weakness 2017,severe mitral stenosis,depression, thrombocytopenia, and negative anticardiolipin antibody, weakly positive ANA, acute GI bleed in January 2017 caused by severe esophagitis and bleeding rectal ulcer, IVC filter, pulmonary arterial hypertensionm, right-sided heart dysfunction consistent with cor pulmonale, significant venous engorgement of the suprahepatic IVC and hepatic veins suspicious for congestive hepatopathy and cardiac cirrhosis.  1.  End-stage renal disease on peritoneal dialysis.  Patient did well with peritoneal dialysis last night. Ultrafiltration achieved was 1 kg. Continue peritoneal dialysis as ordered.   2.  Right pleural effusion. This appears to be recurrent in nature.  Consider pulmonary consultation, management per hospitalist.  3.  Secondary  hyperparathyroidism.  Continue PhosLo, calcitriol, and Sensipar.  4.  Anemia chronic kidney disease.given relatively recent CVA we would recommend avoiding Epogen.  This was discussed with the patient as an outpatient.    LOS:  Lisel Siegrist 3/23/20178:51 AM

## 2016-03-06 NOTE — Progress Notes (Signed)
[facility] [place] [facility]   PATIENT NAME: Dorothy CaffeyDonnella Long    MR#:  [id number]  DATE OF BIRTH:  [birth year]  SUBJECTIVE:  CHIEF COMPLAINT:   Chief Complaint  Patient presents with  . Leg Pain  . Chest Pain  Bilateral chest pain, on and off, no relation with activity is, some cough and shortness of breath.  Found to have pleural effusion, which appears to be chronic.   S/p thoracentesis on 03/06/16,mostly transudative.  REVIEW OF SYSTEMS:  CONSTITUTIONAL: No fever, fatigue or weakness.  EYES: No blurred or double vision.  EARS, NOSE, AND THROAT: No tinnitus or ear pain.  RESPIRATORY: No cough, Positive shortness of breath, no wheezing or hemoptysis.  CARDIOVASCULAR: Positive chest pain, no orthopnea, edema.  GASTROINTESTINAL: No nausea, vomiting, diarrhea or abdominal pain.  GENITOURINARY: No dysuria, hematuria.  ENDOCRINE: No polyuria, nocturia,  HEMATOLOGY: No anemia, easy bruising or bleeding SKIN: No rash or lesion. MUSCULOSKELETAL: No joint pain or arthritis.   NEUROLOGIC: No tingling, numbness, weakness.  PSYCHIATRY: No anxiety or depression.   ROS  DRUG ALLERGIES:   Allergies  Allergen Reactions  . Buprenorphine Nausea And Vomiting  . Ciprocinonide [Fluocinolone] Hives  . Demerol [Meperidine] Nausea And Vomiting  . Diflucan [Fluconazole] Nausea And Vomiting  . Flagyl [Metronidazole] Hives and Itching  . Hydrocodone Itching  . Levaquin [Levofloxacin] Other (See Comments)    Reaction: Numbness in legs   . Morphine And Related Nausea And Vomiting  . Tetracyclines & Related Itching and Swelling  . Valium [Diazepam] Nausea And Vomiting and Other (See Comments)    Reaction:  Hallucinations     VITALS:  Blood pressure 126/97, pulse 88, temperature 98 F (36.7 C), temperature source Oral, resp. rate 16, height 5\' 10"  (1.778 m), weight 49.85 kg (109 lb 14.4 oz), SpO2 97 %.  PHYSICAL EXAMINATION:  GENERAL:  55 y.o.-year-old Thin  patient lying in the bed with no acute distress.  EYES: Pupils equal, round, reactive to light and accommodation. No scleral icterus. Extraocular muscles intact.  HEENT: Head atraumatic, normocephalic. Oropharynx and nasopharynx clear.  NECK:  Supple, no jugular venous distention. No thyroid enlargement, no tenderness.  LUNGS: Decreased breath sounds on the right lower, no wheezing, rales,rhonchi or crepitation. No use of accessory muscles of respiration.  CARDIOVASCULAR: S1, S2 normal. No murmurs, rubs, or gallops.  ABDOMEN: Soft, nontender, nondistended. Bowel sounds present. No organomegaly or mass.  EXTREMITIES: No pedal edema, cyanosis, or clubbing.  NEUROLOGIC: Cranial nerves II through XII are intact. Muscle strength 5/5 in all extremities. Sensation intact. Gait not checked.  PSYCHIATRIC: The patient is alert and oriented x 3.  SKIN: No obvious rash, lesion, or ulcer.   Physical Exam LABORATORY PANEL:   CBC  Recent Labs Lab 03/05/16 0413  WBC 4.4  HGB 8.1*  HCT 24.9*  PLT 147*   ------------------------------------------------------------------------------------------------------------------  Chemistries   Recent Labs Lab 03/06/16 0657  NA 131*  K 3.7  CL 98*  CO2 24  GLUCOSE 82  BUN 52*  CREATININE 8.18*  CALCIUM 7.8*  AST 20  ALT 16  ALKPHOS 83  BILITOT 0.5   ------------------------------------------------------------------------------------------------------------------  Cardiac Enzymes  Recent Labs Lab 03/05/16 0617 03/05/16 1235  TROPONINI 0.04* 0.05*   ------------------------------------------------------------------------------------------------------------------  RADIOLOGY:  Dg Chest 1 View  03/06/2016  CLINICAL DATA:  Post right thoracentesis EXAM: CHEST 1 VIEW COMPARISON:  03/04/2016 FINDINGS: Marked cardiomegaly with central vascular congestion. Resolution of the right effusion following thoracentesis. No pneumothorax. Minor bibasilar  atelectasis. Heavy calcification noted of the mitral valve. Trachea is midline. IMPRESSION: Negative for pneumothorax following thoracentesis. Cardiomegaly without CHF Extensive mitral valve calcifications. Electronically Signed   By: Judie Petit.  Shick M.D.   On: 03/06/2016 11:13   Ct Chest Wo Contrast  03/05/2016  CLINICAL DATA:  End-stage renal disease with right pleural effusion and chronic congestive heart failure. EXAM: CT CHEST WITHOUT CONTRAST TECHNIQUE: Multidetector CT imaging of the chest was performed following the standard protocol without IV contrast. COMPARISON:  Chest CT February 08, 2016; chest radiograph March 04, 2016 FINDINGS: Mediastinum/Lymph Nodes: Thyroid appears unremarkable. There are subcentimeter mediastinal lymph nodes but no adenopathy by size criteria. There is no appreciable thoracic aortic aneurysm. Visualized great vessels appear unremarkable. There is generalized cardiomegaly with primarily right heart enlargement. There is a small pericardial effusion. There are multiple foci of coronary artery calcification. There is extensive mitral valve calcification. There is prominence of the central pulmonary arteries with rapid peripheral tapering. Lungs/Pleura: There remains a sizable right pleural effusion with atelectatic change in the right lower lobe. There is scarring in the lung bases. There is a rather minimal left pleural effusion with mild left base atelectasis. The somewhat nodular appearing opacity on axial images in the medial left base on prior study appears consistent with localized atelectasis at this time. Upper abdomen: In the visualized upper abdomen, kidneys are atrophic. Liver has a somewhat nodular contour, concerning for a degree of underlying cirrhosis. There is a filter in the inferior vena cava. There is calcification in the visualized abdominal aorta. Gallbladder is absent. Musculoskeletal: Bones are somewhat sclerotic consistent with chronic renal failure. No lytic  bone lesions are evident. IMPRESSION: There is a persistent sizable right pleural effusion with right base atelectasis. There is a minimal left pleural effusion with patchy scarring and atelectasis in the left base region. Small pericardial effusion. Changes indicative of a degree of cor pulmonale, stable. Extensive mitral valve and annulus calcification. There are multiple foci of coronary artery calcification. No adenopathy. Bony changes of chronic renal failure. Filter in the inferior vena cava. Kidneys atrophic. Liver has a somewhat nodular contour, concerning for a degree of underlying hepatic cirrhosis. Electronically Signed   By: Bretta Bang III M.D.   On: 03/05/2016 15:47   US Venous Img Lower Unilateral Left  03/06/2016  CLINICAL DATA:  Shortness of breath.  History of DVT. EXAM: LEFT LOWER EXTREMITY VENOUS DOPPLER ULTRASOUND TECHNIQUE: Gray-scale sonography with graded compression, as well as color Doppler and duplex ultrasound were performed to evaluate the lower extremity deep venous systems from the level of the common femoral vein and including the common femoral, femoral, profunda femoral, popliteal and calf veins including the posterior tibial, peroneal and gastrocnemius veins when visible. The superficial great saphenous vein was also interrogated. Spectral Doppler was utilized to evaluate flow at rest and with distal augmentation maneuvers in the common femoral, femoral and popliteal veins. COMPARISON:  None. FINDINGS: Contralateral Common Femoral Vein: Respiratory phasicity is normal and symmetric with the symptomatic side. No evidence of thrombus. Normal compressibility. Common Femoral Vein: No evidence of thrombus. Normal compressibility, respiratory phasicity and response to augmentation. Saphenofemoral Junction: No evidence of thrombus. Normal compressibility and flow on color Doppler imaging. Profunda Femoral Vein: No evidence of thrombus. Normal compressibility and flow on color  Doppler imaging. Femoral Vein: No evidence of thrombus. Normal compressibility, respiratory phasicity and response to augmentation. Popliteal Vein: No evidence of thrombus. Normal compressibility, respiratory phasicity and response to augmentation. Calf Veins: No evidence  of thrombus. Normal compressibility and flow on color Doppler imaging. Superficial Great Saphenous Vein: No evidence of thrombus. Normal compressibility and flow on color Doppler imaging. Other Findings:  None. IMPRESSION: No evidence of deep venous thrombosis. Electronically Signed   By: Maisie Fus  Register   On: 03/06/2016 11:20   US Thoracentesis Asp Pleural Space W/img Guide  03/06/2016  INDICATION: Shortness of breath, large right pleural effusion by CT EXAM: ULTRASOUND GUIDED RIGHT THORACENTESIS MEDICATIONS: 1% lidocaine locally COMPLICATIONS: None immediate. PROCEDURE: An ultrasound guided thoracentesis was thoroughly discussed with the patient and questions answered. The benefits, risks, alternatives and complications were also discussed. The patient understands and wishes to proceed with the procedure. Written consent was obtained. Ultrasound was performed to localize and mark an adequate pocket of fluid in the right chest. The area was then prepped and draped in the normal sterile fashion. 1% Lidocaine was used for local anesthesia. Under ultrasound guidance a 6 Fr Safe-T-Centesis catheter was introduced. Thoracentesis was performed. The catheter was removed and a dressing applied. FINDINGS: A total of approximately 1.3 L of clear pleural fluid was removed. Samples were sent to the laboratory as requested by the clinical team. IMPRESSION: Successful ultrasound guided right thoracentesis yielding 1.3 L of pleural fluid. Electronically Signed   By: Judie Petit.  Shick M.D.   On: 03/06/2016 09:53    ASSESSMENT AND PLAN:   Principal Problem:   Chest pain Active Problems:   Hyperlipidemia   Chronic kidney disease with peritoneal dialysis as  preferred modality, stage 5 (HCC)   GERD (gastroesophageal reflux disease)   Paroxysmal atrial fibrillation (HCC)   Chronic combined systolic and diastolic CHF (congestive heart failure) (HCC)   Pleural effusion on right   Shortness of breath   Mitral valve stenosis, severe   Coronary artery disease involving native coronary artery of native heart without angina pectoris   Aortic arch atherosclerosis (HCC)   * Chest pain -  cycled her enzymes, stable, echocardiogram was done recently so no need to repeat.    As pt have Mitral stenosis, pulmonary Htn, calcification of Coronaries per CT chest and severe stenosis on intraabdominal arteries and renal artery- suggesting severe arterial disease- called Cardio consult.   They planned for EEG tomorrow.  * Pleural effusion   Most likely this is the reason for the chest pain.   She did not had pulmonary embolism as checked last month with CT angiogram   Right leg DVT study is done this time and it is negative.Left leg DVT- negative.   I called pulmonary consult to help with this pleural effusion.   S/p thoracentesis 03/06/16- mostly transudative fluid.  *  Chronic kidney disease with peritoneal dialysis as preferred modality, stage 5 [facility]) - nephrology consult for assistance with dialysis and overall assessment of fluid status.  * Chronic combined systolic and diastolic CHF (congestive heart failure) (HCC) - continue home meds * Hyperlipidemia - continue home meds * GERD (gastroesophageal reflux disease) - home dose PPI * Paroxysmal atrial fibrillation (HCC) - currently in sinus rhythm, monitor with telemetry    All the records are reviewed and case discussed with Care Management/Social Workerr. Management plans discussed with the patient, family and they are in agreement.  CODE STATUS: Full  TOTAL TIME TAKING CARE OF THIS PATIENT: 40 minutes.   Plan discussed with patient, her husband in the room and with Dr. Ardyth Man  POSSIBLE D/C IN  2-3 DAYS, DEPENDING ON CLINICAL CONDITION.   Altamese Dilling M.D on 03/06/2016   Between  7am to 6pm - Pager - [phone]  After 6pm go to [email or web address] - password [email or web address]  [facility]  Office  [phone]  CC: Primary care physician; Sherlene Shams, MD  Note: This dictation was prepared with Dragon dictation along with smaller phrase technology. Any transcriptional errors that result from this process are unintentional.

## 2016-03-06 NOTE — Progress Notes (Signed)
Patient resting comfortably at this time, family at bedside. C/o nausea earlier and zofran given. No other complaints at this time. Did refuse phoslo d/t nausea. Peritoneal dialysis to be started shortly by dialysis nurse. Currently patient is NSR. Trudee KusterBrandi R Mansfield

## 2016-03-06 NOTE — Procedures (Signed)
Successful RT THORACENTESIS  1.3 L REMOVED LABS SENT NO COMP STABLE CXR PENDING FULL REPORT IN PACS

## 2016-03-06 NOTE — Progress Notes (Signed)
Peritoneal dialysis start 

## 2016-03-06 NOTE — Progress Notes (Signed)
[facility]* [facility]   Assessment and Plan:  Left Pleural effusion.  -Likely multifactorial from congestive heart failure and renal failure with volume overload-- the patient is status post right thoracentesis with removal of 1.3 L. Review of the lab testing of the fluid shows that this is most consistent with a transudative pleural effusion. This is likely secondary to volume overload with pulmonary hypertension from mitral stenosis as well as renal failure.  -Patient is asked to follow up with us on outpatient basis in approximately one month, at that time we will consider repeat chest x-ray to reevaluate the presence of pleural effusion. I did discuss with her husband at the effusion is likely to recur in the future given the underlying source. In addition, we will need to review further pleural fluid results including cytology.  Systolic Congestive heart failure. Known history of moderate to severe mitral stenosis. -May be contributing to pleural effusion. -Discussed with cardiology, they're planning a TEE to look at the degree of mitral stenosis.  ESRD with history of FSGS.  -Currently on PD.    Pulmonary hypertension. -Likely secondary to above etiologies.  Pulmonary service will sign off for now, please call if there are any questions or concerns.  Date: 03/06/2016  MRN# [id number] Dorothy Long [birth year]   Dorothy Long is a 55 y.o. old female seen in follow up for chief complaint of  Chief Complaint  Patient presents with  . Leg Pain  . Chest Pain     HPI:   Patient appears to be feeling well and in good spirits. She has no complaints at this time.  Allergies:  Buprenorphine; Ciprocinonide; Demerol; Diflucan; Flagyl; Hydrocodone; Levaquin; Morphine and related; Tetracyclines & related; and Valium  Review of Systems: Gen:  Denies  fever, sweats. HEENT: Denies blurred vision. Cvc:  No dizziness, chest pain or heaviness Resp:   Denies cough  or sputum porduction. Gi: Denies swallowing difficulty, stomach pain. constipation, bowel incontinence Gu:  Denies bladder incontinence, burning urine Ext:   No Joint pain, stiffness. Skin: No skin rash, easy bruising. Endoc:  No polyuria, polydipsia. Psych: No depression, insomnia. Other:  All other systems were reviewed and found to be negative other than what is mentioned in the HPI.   Physical Examination:   VS: BP 134/88 mmHg  Pulse 71  Temp(Src) 97.6 F (36.4 C) (Oral)  Resp 16  Ht 5\' 10"  (1.778 m)  Wt 109 lb 14.4 oz (49.85 kg)  BMI 15.77 kg/m2  SpO2 100%  General Appearance: No distress  Neuro:without focal findings,  speech normal,  HEENT: PERRLA, EOM intact. Pulmonary: normal breath sounds, No wheezing.   CardiovascularNormal S1,S2.  No m/r/g.   Abdomen: Benign, Soft, non-tender. Renal:  No costovertebral tenderness  GU:  Not performed at this time. Endoc: No evident thyromegaly, no signs of acromegaly. Skin:   warm, no rash. Extremities: normal, no cyanosis, clubbing.   LABORATORY PANEL:   CBC  Recent Labs Lab 03/05/16 0413  WBC 4.4  HGB 8.1*  HCT 24.9*  PLT 147*   ------------------------------------------------------------------------------------------------------------------  Chemistries   Recent Labs Lab 03/06/16 0657  NA 131*  K 3.7  CL 98*  CO2 24  GLUCOSE 82  BUN 52*  CREATININE 8.18*  CALCIUM 7.8*  AST 20  ALT 16  ALKPHOS 83  BILITOT 0.5   ------------------------------------------------------------------------------------------------------------------  Cardiac Enzymes  Recent Labs Lab 03/05/16 1235  TROPONINI 0.05*   ------------------------------------------------------------  RADIOLOGY:   No results found for this or  any previous visit. Results for orders placed during the hospital encounter of 03/04/16  DG Chest 2 View   Narrative CLINICAL DATA:  Right lower leg pain and swelling for 5 days. Right-sided chest pain  starting yesterday.  EXAM: CHEST  2 VIEW  COMPARISON:  12/18/2015  FINDINGS: Cardiac enlargement with mild central pulmonary vascular congestion suggesting arterial hypertension. Calcification projected over the heart corresponding calcification in the mitral valve annulus on prior CT. Mediastinal contours appear intact. There is increasing right pleural effusion with basilar atelectasis or consolidation in the right lung since the previous study. No pneumothorax. Vena caval filter and surgical clips demonstrated in the right upper quadrant.  IMPRESSION: Cardiac enlargement with central pulmonary vascular congestion. Increasing right pleural effusion with basilar infiltration or atelectasis.   Electronically Signed   By: Burman Nieves M.D.   On: 03/04/2016 19:40    ------------------------------------------------------------------------------------------------------------------  Thank  you for allowing [facility] to assist in the care of your patient. Our recommendations are noted above.  Please contact us if we can be of further service.   Wells Guiles, MD.  [facility]  Santiago Glad, M.D.  Stephanie Acre, M.D.  Billy Fischer, M.D

## 2016-03-06 NOTE — Consult Note (Signed)
Cardiology Consultation Note  Patient ID: Dorothy Long, MRN: [id number], DOB/AGE: [birth year] 55 y.o. Admit date: 03/04/2016   Date of Consult: 03/06/2016 Primary Physician: Sherlene Shams, MD Primary Cardiologist: Dr. Mariah Milling, MD  Chief Complaint: Chest and leg pain x 1 week Reason for Consult: Vascular disease/mitral valve disease  HPI: 55 y.o. 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 She presented again to [facility] on 12/27 with AMS.   Her first and only occurence of Afib occured while in the hospital on 09/21/2013 in the setting of acute diarrhea illness. CHADVASc does calulate out to be at least 4 (CHF, HTN, DM, female). Given this was a one-time event she has not been placed on long term, full dose anticoagulation. At that time in hospital echo showed EF 45-50%, diastolic dysfunction, biatrial enalargement, mild aortic regurgitation. Mitral leaflets were thickened and appeared to be rheumatically deformed. There was heavy mitral annular calcification. Mild mitral stenosis with a calcificed valve area of 1.6 cm^2 by pressure half-time equation and moderate, eccentric mitral regurgitation. Mild to moderate pulmonary HTN at 35 mm Hg. She underwent pharmacological stress test during that admission that showed no convincing evidence of pharmacologically induced myocardial  ischemia. Apparent anterior apical defect that was present only on attenuation corrected images was favored to reflect artifact due to overcorrection and may have been worse on stress imaging secondary to greater patient motion. A small area of ischemia was felt less likely but was difficult to entirely exclude. EF 55%.   Echo performed 05/04/2014 to evaluate her mitral valve showed EF 45-50%, borderline concentric LVH, moderately dilated left atrium, mildly dilated right atrium, small pericardial effusion, moderate mitral regurgitation. There was rheumatic deformity. Moderate thickeing and calficication of the anterior and posterior mitral valves. No gradient was recorded. The mitral stenosis appeared mild to moderate visually. Mild aortic sclerosis without stenosis. Mildly elevated PASP. Mild to moderate tricuspid regurgitation.   She has previously undergone renal biopsy that demonstrated focal segmental glomerulosclerosis at [facility] in the setting of her SCr going from 1.8 to 5 to 6. Ultrasound showed atrophic right kidney. Now ESRD on HD with concomitant PD. She has started classes for kidney transplant through [facility] and has started the transplant process at [facility].   She was admitted to [facility] from 6/30 to 7/2 for abdominal/chest pain. CT abdomen pelvis on 7/1 at that time showed small left inguinal hernia. Scan on 6/30 showed enlarged liver and possible cirrhosis, advised to follow up with GI. Troponin was mildly elevated and flat trending at 0.10-->0.11 x 2 in the setting of her ESRD. Lipase was elevated at 79 on 6/30. WBC unremarkable. Case was dicussed with vascular who felt her PD catheter was ok and she was discharged home. It was felt her elevated troponin was in the setting of her ESRD. In hospital follow up patient was having considerable N/V/D with associated early satiety.   She was admitted to [facility]  for a second time from 7/8 to 7/10 for abdominal pain secondary left inguinal hernia and nausea.  Initially suspected as spontaneous bacterial peritonitis and treated with IV Rocephin. Fluid cultures were negative. She responded well to Zofran.   She presented to the ED on 7/18, after last HD session being performed on 7/16, with complaints of lower extremity edema, SOB with exertion, and orthopnea. CXR consistent with fluid overload/mild CHF. Venous HTN and small effusions. Weight 119 pound (53.9 kg) goal dry weight 54.5 kg. Case was discussed with nephrology and she was secured an outpatient dialysis session for later in the day.   She followed up with Dr. Lemar Livings, MD on 7/21 for evaluation of her left inguinal hernia and ventral hernia. At that time she had continued SOB and abdominal pain. She underwent successful repair of the left indirect inguinal and ventral hernias on 07/13/2015. She was admitted to [facility] in late October for colitis.   She was admitted to [facility] on 12/3 with presyncope after cleaning her kitchen prior to leaving for church and suddenly felt very weak along the left side with associated slurred speech. CT head with no acute findings. MRI without acute stroke. Carotid doppler without significant carotid stenosis bilaterally. Echo showed EF 40-45%, diffuse HK, GR2DD, high ventricular filling pressures, calcified aortic annulus, mild AI, severely restricted mitral valve c/w moderate stenosis though by visual appearance the stenosis appeared to be severe, severely dilated LA, severely dilated RA, mild TR and PR, inferior vena cava was dilated. Her syncopal episode was of unclear etiology. It was recommended she undergo TEE in the outpatient setting (as this is generally not associated with syncope) and a 30 day event monitor. She was admitted again in late December 2016 with acute CVA after developing syncope and slurred speech. MRI brain showed minimal flow into the left ICA. Angiogram was done which showed minimal atherosclerotic lesion at the bifurcation of carotids bilaterally and  normal left side long segment. No intervention.   She was admitted again in February 2017 for upper GI bleed. She was seen by GI and treated supportively s/p transfusion. Echo that admission showed EF 55-60%, mild AI, calcified mitral annulus, unable to exclude vegetation, moderate mitral regurgitation, left atrium was moderately dilated, right atrium was mildly to moderately dilated, PASP 49 mm Hg. This echo was read by outside group and she was consulted on by outside group.  She presented to [facility] on 3/21 with a one week history of chest pain and LE swelling coupled with SOB. She denies any weight gain, orthopnea, PND, or early satiety. She has not missed any dialysis sessions. Upon her arrival she was found to have cardiac enlargement with central pulmonary vascular congestion and increasing right pleural effusion with basilar infiltration. Lower extremity ultrasound was negative for DVT. She underwent ultrasound guided thoracentesis on 3/23 with aspiration of 1.3 L of clear pleural fluid. Troponin has ben mildly elevated at 0.05 to 0.04 in the setting of her ESRD on dialysis.    Past Medical History  Diagnosis Date  . Hypertension   . Hyperlipidemia   . COPD (chronic obstructive pulmonary disease) (HCC)     a. not on home O2  . Anemia   . Valvular disease     a. 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 echo 2015: EF 45-50%, borderline  LVH, mod dilated LA, mildly dilated RA, small pericardial effusion, mod MR (rheumatic deformity), MS mild-mod, no gradient, PASP ele  . Chronic combined systolic and diastolic CHF (congestive heart failure) (HCC)     a. echo reports above  . PAF (paroxysmal atrial fibrillation) (HCC)     a. in setting of diarrhea 09/21/2013; b. not on long term full dose anticoagulation; c. CHADSVASC at  least 4 (CHF, HTN, DM, female)  . Diabetes mellitus (HCC)   . Peritoneal dialysis status (HCC) 4- 15-15  . Ventral hernia   . Inguinal hernia   . History of stress test     a. 2014: no convincing evidence of pharmacologically induced ischemia,  apparent ant apical defect present only on attenuation corrected images favored to reflect artifact due to overcorrection & may be worse on stress imaging secondary to greater patient motion. A small area of ischemia is felt less likely but is difficult to entirely exclude, EF 55%  . ESRD (end stage renal disease) on dialysis (HCC)     a. HD on T,T,S; b. 2/2 focal segmental glomerulosclerosis   . Dialysis patient (HCC)   . Renal insufficiency   . Stroke [facility]) 11/2015    a. MRI L cereblal infarctions in the MCA/PCA, ACA/MCA territory; b. felt to be carotid in etiology; c. medically managed on DAPT per neuro      Most Recent Cardiac Studies: As above   Surgical History:  Past Surgical History  Procedure Laterality Date  . Abdominal hysterectomy    . Cholecystectomy    . Knee surgery Right   . Foot surgery Bilateral   . Av fistula placement Left 10-26-13    Dr Gilda Crease  . Peritoneal catheter insertion  03-29-14    Dr Gilda Crease  . Inguinal hernia repair Left 07/13/2015    Procedure: HERNIA REPAIR INGUINAL ADULT;  Surgeon: Earline Mayotte, MD;  Location: [facility] ORS;  Service: General;  Laterality: Left;  Marland Kitchen Ventral hernia repair N/A 07/13/2015    Procedure: HERNIA REPAIR VENTRAL ADULT;  Surgeon: Earline Mayotte, MD;  Location: [facility] ORS;  Service: General;  Laterality: N/A;     Home Meds: Prior to Admission medications   Medication Sig Start Date End Date Taking? Authorizing Provider  acetaminophen (TYLENOL) 325 MG tablet Take 650 mg by mouth every 4 (four) hours as needed for mild pain or headache.    Yes Historical Provider, MD  albuterol (PROVENTIL HFA;VENTOLIN HFA) 108 (90 Base) MCG/ACT inhaler Inhale 2 puffs into the lungs every 6 (six) hours  as needed for wheezing or shortness of breath.   Yes Historical Provider, MD  aspirin 81 MG chewable tablet Chew 81 mg by mouth daily.   Yes Historical Provider, MD  atorvastatin (LIPITOR) 80 MG tablet Take 80 mg by mouth daily.    Yes Historical Provider, MD  calcitRIOL (ROCALTROL) 0.5 MCG capsule Take 0.5 mcg by mouth daily.    Yes Historical Provider, MD  calcium acetate (PHOSLO) 667 MG capsule Take 2,001 mg by mouth 3 (three) times daily with meals.   Yes Historical Provider, MD  cinacalcet (SENSIPAR) 30 MG tablet Take 30 mg by mouth daily.   Yes Historical Provider, MD  docusate sodium (COLACE) 100 MG capsule Take 100 mg by mouth daily as needed for mild constipation.    Yes Historical Provider, MD  ondansetron (ZOFRAN-ODT) 4 MG disintegrating tablet Take 4 mg by mouth every 8 (eight) hours as needed for nausea or vomiting.    Yes Historical Provider,  MD  pantoprazole (PROTONIX) 40 MG tablet Take 40 mg by mouth 2 (two) times daily.    Yes Historical Provider, MD  sodium chloride 1 g tablet Take 2 g by mouth 2 (two) times daily with a meal.   Yes Historical Provider, MD  sucralfate (CARAFATE) 1 GM/10ML suspension Take 10 mLs (1 g total) by mouth every 6 (six) hours. 02/12/16  Yes Houston Siren, MD  tiotropium (SPIRIVA) 18 MCG inhalation capsule Place 1 capsule (18 mcg total) into inhaler and inhale daily. 08/22/15  Yes Carollee Leitz, NP    Inpatient Medications:  . aspirin  81 mg Oral Daily  . atorvastatin  80 mg Oral Daily  . calcitRIOL  0.5 mcg Oral Daily  . calcium acetate  2,001 mg Oral TID WC  . cinacalcet  30 mg Oral Daily  . dialysis solution 1.5% low-MG/low-CA   Intraperitoneal Q24H  . gentamicin cream  1 application Topical Daily  . heparin  5,000 Units Subcutaneous 3 times per day  . pantoprazole  40 mg Oral BID  . sodium chloride flush  3 mL Intravenous Q12H  . tiotropium  18 mcg Inhalation Daily      Allergies:  Allergies  Allergen Reactions  . Buprenorphine Nausea And  Vomiting  . Ciprocinonide [Fluocinolone] Hives  . Demerol [Meperidine] Nausea And Vomiting  . Diflucan [Fluconazole] Nausea And Vomiting  . Flagyl [Metronidazole] Hives and Itching  . Hydrocodone Itching  . Levaquin [Levofloxacin] Other (See Comments)    Reaction: Numbness in legs   . Morphine And Related Nausea And Vomiting  . Tetracyclines & Related Itching and Swelling  . Valium [Diazepam] Nausea And Vomiting and Other (See Comments)    Reaction:  Hallucinations     Social History   Social History  . Marital Status: Married    Spouse Name: N/A  . Number of Children: N/A  . Years of Education: N/A   Occupational History  . disabled    Social History Main Topics  . Smoking status: Former Smoker -- 0.00 packs/day for 0 years  . Smokeless tobacco: Never Used  . Alcohol Use: No     Comment: occasional  . Drug Use: No  . Sexual Activity: No   Other Topics Concern  . Not on file   Social History Narrative     Family History  Problem Relation Age of Onset  . Cancer Mother     colon  . Heart disease Father   . Hypertension Father      Review of Systems: Review of Systems  Constitutional: Positive for malaise/fatigue. Negative for fever, chills, weight loss and diaphoresis.  HENT: Negative for congestion.   Eyes: Negative for discharge and redness.  Respiratory: Positive for cough and shortness of breath. Negative for hemoptysis, sputum production and wheezing.   Cardiovascular: Positive for chest pain. Negative for palpitations, orthopnea, claudication, leg swelling and PND.  Gastrointestinal: Negative for vomiting and abdominal pain.  Musculoskeletal: Negative for myalgias and falls.  Skin: Negative for rash.  Neurological: Positive for dizziness and weakness. Negative for tingling, tremors, sensory change, speech change, focal weakness and loss of consciousness.  Endo/Heme/Allergies: Does not bruise/bleed easily.  Psychiatric/Behavioral: The patient is not  nervous/anxious.   All other systems reviewed and are negative.   Labs:  Recent Labs  03/04/16 1843 03/05/16 0035 03/05/16 0617 03/05/16 1235  TROPONINI 0.06* 0.05* 0.04* 0.05*   Lab Results  Component Value Date   WBC 4.4 03/05/2016   HGB 8.1* 03/05/2016  HCT 24.9* 03/05/2016   MCV 84.1 03/05/2016   PLT 147* 03/05/2016    Recent Labs Lab 03/06/16 0657  NA 131*  K 3.7  CL 98*  CO2 24  BUN 52*  CREATININE 8.18*  CALCIUM 7.8*  PROT 5.3*  BILITOT 0.5  ALKPHOS 83  ALT 16  AST 20  GLUCOSE 82   Lab Results  Component Value Date   CHOL 137 12/11/2015   HDL 42 12/11/2015   LDLCALC 81 12/11/2015   TRIG 71 12/11/2015   No results found for: DDIMER  Radiology/Studies:  Dg Chest 1 View  03/06/2016  CLINICAL DATA:  Post right thoracentesis EXAM: CHEST 1 VIEW COMPARISON:  03/04/2016 FINDINGS: Marked cardiomegaly with central vascular congestion. Resolution of the right effusion following thoracentesis. No pneumothorax. Minor bibasilar atelectasis. Heavy calcification noted of the mitral valve. Trachea is midline. IMPRESSION: Negative for pneumothorax following thoracentesis. Cardiomegaly without CHF Extensive mitral valve calcifications. Electronically Signed   By: Judie Petit.  Shick M.D.   On: 03/06/2016 11:13   Dg Chest 2 View  03/04/2016  CLINICAL DATA:  Right lower leg pain and swelling for 5 days. Right-sided chest pain starting yesterday. EXAM: CHEST  2 VIEW COMPARISON:  12/18/2015 FINDINGS: Cardiac enlargement with mild central pulmonary vascular congestion suggesting arterial hypertension. Calcification projected over the heart corresponding calcification in the mitral valve annulus on prior CT. Mediastinal contours appear intact. There is increasing right pleural effusion with basilar atelectasis or consolidation in the right lung since the previous study. No pneumothorax. Vena caval filter and surgical clips demonstrated in the right upper quadrant. IMPRESSION: Cardiac  enlargement with central pulmonary vascular congestion. Increasing right pleural effusion with basilar infiltration or atelectasis. Electronically Signed   By: Burman Nieves M.D.   On: 03/04/2016 19:40   Ct Chest Wo Contrast  03/05/2016  CLINICAL DATA:  End-stage renal disease with right pleural effusion and chronic congestive heart failure. EXAM: CT CHEST WITHOUT CONTRAST TECHNIQUE: Multidetector CT imaging of the chest was performed following the standard protocol without IV contrast. COMPARISON:  Chest CT February 08, 2016; chest radiograph March 04, 2016 FINDINGS: Mediastinum/Lymph Nodes: Thyroid appears unremarkable. There are subcentimeter mediastinal lymph nodes but no adenopathy by size criteria. There is no appreciable thoracic aortic aneurysm. Visualized great vessels appear unremarkable. There is generalized cardiomegaly with primarily right heart enlargement. There is a small pericardial effusion. There are multiple foci of coronary artery calcification. There is extensive mitral valve calcification. There is prominence of the central pulmonary arteries with rapid peripheral tapering. Lungs/Pleura: There remains a sizable right pleural effusion with atelectatic change in the right lower lobe. There is scarring in the lung bases. There is a rather minimal left pleural effusion with mild left base atelectasis. The somewhat nodular appearing opacity on axial images in the medial left base on prior study appears consistent with localized atelectasis at this time. Upper abdomen: In the visualized upper abdomen, kidneys are atrophic. Liver has a somewhat nodular contour, concerning for a degree of underlying cirrhosis. There is a filter in the inferior vena cava. There is calcification in the visualized abdominal aorta. Gallbladder is absent. Musculoskeletal: Bones are somewhat sclerotic consistent with chronic renal failure. No lytic bone lesions are evident. IMPRESSION: There is a persistent sizable  right pleural effusion with right base atelectasis. There is a minimal left pleural effusion with patchy scarring and atelectasis in the left base region. Small pericardial effusion. Changes indicative of a degree of cor pulmonale, stable. Extensive mitral valve and annulus calcification.  There are multiple foci of coronary artery calcification. No adenopathy. Bony changes of chronic renal failure. Filter in the inferior vena cava. Kidneys atrophic. Liver has a somewhat nodular contour, concerning for a degree of underlying hepatic cirrhosis. Electronically Signed   By: Bretta Bang III M.D.   On: 03/05/2016 15:47   Ct Angio Chest Pe W/cm &/or Wo Cm  02/08/2016  CLINICAL DATA:  55 year old female with a history of possible systemic venous obstruction EXAM: CT ANGIOGRAPHY AND VENOGRAPHY CHEST, ABDOMEN AND PELVIS TECHNIQUE: Multidetector CT imaging through the chest, abdomen and pelvis was performed using the standard protocol during bolus administration of intravenous contrast. Multiplanar reconstructed images and MIPs were obtained and reviewed to evaluate the vascular anatomy. CONTRAST:  100 mL Omnipaque 350 COMPARISON:  Prior CT of abdomen and pelvis 06/22/2015; prior CT scan of the chest 02/09/2015 FINDINGS: CTA CHEST FINDINGS Mediastinum: Unremarkable CT appearance of the thyroid gland. No suspicious mediastinal or hilar adenopathy. No soft tissue mediastinal mass. The thoracic esophagus is unremarkable. Heart/Vascular: Pulmonary arterial phase imaging demonstrates excellent opacification of the pulmonary arteries to the proximal subsegmental level. There is no evidence of pulmonary embolus. The main pulmonary artery is enlarged measuring up to 3.6 cm in diameter. This is larger than the adjacent aorta. The heart is also markedly enlarged, particularly the right heart consistent with cor pulmonale. Case ease calcification is present along the mitral valve annulus. Atherosclerotic calcifications present  along the left anterior descending, circumflex and right coronary arteries. No pericardial effusion. Lungs/Pleura: Large layering right pleural effusion with associated right lower lobe atelectasis. No definite pleural enhancement or nodularity. Perhaps mild interlobular septal thickening and ground-glass attenuation airspace opacity in the lungs consistent with mild interstitial edema. Diffuse mild bronchial wall thickening. On the axial images, there is a somewhat nodular opacity in the medial aspect of the left lower lobe measuring 10 x 8 mm. However, on the coronal and sagittal reformatted images this opacity appears more linear and is therefore strongly favored to represent a region of atelectasis. Bones/Soft Tissues: No acute fracture or aggressive appearing lytic or blastic osseous lesion. Review of the MIP images confirms the above findings. CTA ABDOMEN AND PELVIS FINDINGS VASCULAR Aorta: Normal caliber abdominal aorta without evidence of aneurysm. Scattered atherosclerotic plaque is present throughout. No significant stenosis or evidence of dissection. Celiac: High-grade stenosis versus occlusion of the proximal aspect of the celiac artery secondary to bulky and calcified atherosclerotic plaque. The distal branches reconstitute, possibly through collateralization via the pancreaticoduodenal arcade. SMA: Superior mesenteric artery is widely patent. Prominent pancreaticoduodenal arcade. Renals: The native kidneys are atretic bilaterally. The renal arteries are miniscule and not well seen. IMA: Atretic superior mesenteric artery.  No definite stenosis. Inflow: Calcified atherosclerotic plaque results in focal high-grade stenosis versus short segment occlusion in the left common iliac artery. There is a focal irregular calcified plaque extending into the proximal right common iliac artery resulting in an approximately 50% stenosis. High-grade stenosis of the origin of the left internal iliac artery. The right  internal iliac artery is patent. The external iliac arteries are relatively disease free. Proximal Outflow: Diseased bilateral superficial femoral arteries. At least moderate stenosis in the proximal left SFA and mild stenosis in the proximal right SFA. The vessels remain patent into the upper thighs. Veins: Dedicated venous phase imaging demonstrates engorged ment of the suprahepatic IVC. A potentially retrievable IVC filter is present in the infrarenal IVC. This appears to be a [employer]. No evidence of leg penetration, fracture  or other complication. No thrombus. The IVC remains patent throughout its course. The iliac and visualized femoral veins are also patent. NON-VASCULAR Abdomen: Abnormal appearance of the liver with enlargement of the left lobe and relative atrophy of the right lobe. Additionally, there is a macronodular contour of the liver surface. Findings raise concern for cirrhotic change versus severe congestive hit the top of the related to chronic right heart failure. No discrete lesion identified. The gallbladder is surgically absent. No intra or extrahepatic biliary ductal dilatation. Unremarkable CT appearance of the stomach, duodenum, spleen, adrenal glands and pancreas. The native kidneys are atrophic. No evidence of obstruction or focal bowel wall thickening. Normal appendix in the right lower quadrant. The terminal ileum is unremarkable. A peritoneal dialysis catheter is coiled in the pelvic recess. There is small volume ascites which is not unexpected. Pelvis: Surgical changes of prior hysterectomy. Mild pelvic floor laxity. Free ascites related to peritoneal dialysis. Bones/Soft Tissues: No acute fracture or aggressive appearing lytic or blastic osseous lesion. The bones appear mildly mottled diffusely. This appearance is favored to represent demineralization/osteopenia. Renal osteodystrophy is also a possibility. Review of the MIP images confirms the above findings. IMPRESSION:  VASCULAR 1. Overall vascular findings are most suggestive of pulmonary arterial hypertension and right-sided heart dysfunction consistent with cor pulmonale. 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 2. No evidence of acute pulmonary embolus, aneurysm, or dissection. 3. Critical stenosis versus short focal occlusion of the origin of the celiac artery. The distal branch vessels remain patent due in part to collateralization through the pancreaticoduodenal arcade. 4. Atrophic renal arteries bilaterally with associated atrophy of the native kidneys. 5. Peripheral arterial disease with high-grade stenosis versus short segment occlusion of the left common iliac artery, moderate to high-grade stenosis of the proximal right common iliac artery, high-grade stenosis of the origin of the left internal iliac artery, and mild to moderate superficial femoral artery disease (worse on the left than the right) in the visualized segments of the upper thighs. 6. No evidence of venous thrombosis or obstruction. 7. Well-positioned infrarenal potentially retrievable IVC filter. NON VASCULAR 1. Cardiomegaly with right heart enlargement. 2. Mild interstitial pulmonary edema suggests congestive heart failure. 3. Large layering right pleural effusion with associated right lower lobe atelectasis. 4. Dependent atelectasis in the left lower lobe. 5. 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 Other etiologies for cirrhosis including but not limited to chronic hepatitis, alcoholism are not excluded radiographically. 6. Peritoneal dialysis catheter coiled in the pelvis with associated ascites/dialysate. 7. Mild pelvic floor laxity. 8. Surgical changes of prior hysterectomy and cholecystectomy. 9. Diffusely mottled appearance of the bones favored to represent either demineralization/osteopenia or renal osteodystrophy. 10.  Atrophic native kidneys. Signed, Sterling Big, MD [facility] Electronically Signed   By: Malachy Moan M.D.   On: 02/08/2016 10:25   US Venous Img Lower Unilateral Right  03/04/2016  CLINICAL DATA:  Pain and swelling in the right leg for 2 weeks. Previous history of deep venous thrombosis. Aspirin anticoagulation therapy. EXAM: Right LOWER EXTREMITY VENOUS DOPPLER ULTRASOUND TECHNIQUE: Gray-scale sonography with graded compression, as well as color Doppler and duplex ultrasound were performed to evaluate the lower extremity deep venous systems from the level of the common femoral vein and including the common femoral, femoral, profunda femoral, popliteal and calf veins including the posterior tibial, peroneal and gastrocnemius veins when visible. The superficial great  saphenous vein was also interrogated. Spectral Doppler was utilized to evaluate flow at rest and with distal augmentation maneuvers in the common femoral, femoral and popliteal veins. COMPARISON:  None. FINDINGS: Contralateral Common Femoral Vein: Respiratory phasicity is normal and symmetric with the symptomatic side. No evidence of thrombus. Normal compressibility. Common Femoral Vein: No evidence of thrombus. Normal compressibility, respiratory phasicity and response to augmentation. Saphenofemoral Junction: No evidence of thrombus. Normal compressibility and flow on color Doppler imaging. Profunda Femoral Vein: No evidence of thrombus. Normal compressibility and flow on color Doppler imaging. Femoral Vein: No evidence of thrombus. Normal compressibility, respiratory phasicity and response to augmentation. Popliteal Vein: No evidence of thrombus. Normal compressibility, respiratory phasicity and response to augmentation. Calf Veins: No evidence of thrombus. Normal compressibility and flow on color Doppler imaging. Superficial Great Saphenous Vein: No evidence of thrombus.  Normal compressibility and flow on color Doppler imaging. Venous Reflux:  None. Other Findings:  None. IMPRESSION: No evidence of deep venous thrombosis. Electronically Signed   By: Burman Nieves M.D.   On: 03/04/2016 19:31   US Venous Img Lower Unilateral Right  02/07/2016  CLINICAL DATA:  Swelling. EXAM: RIGHT LOWER EXTREMITY VENOUS DOPPLER ULTRASOUND TECHNIQUE: Gray-scale sonography with graded compression, as well as color Doppler and duplex ultrasound were performed to evaluate the lower extremity deep venous systems from the level of the common femoral vein and including the common femoral, femoral, profunda femoral, popliteal and calf veins including the posterior tibial, peroneal and gastrocnemius veins when visible. The superficial great saphenous vein was also interrogated. Spectral Doppler was utilized to evaluate flow at rest and with distal augmentation maneuvers in the common femoral, femoral and popliteal veins. COMPARISON:  No prior. FINDINGS: Contralateral Common Femoral Vein: Respiratory phasicity is normal and symmetric with the symptomatic side. No evidence of thrombus. Normal compressibility. Common Femoral Vein: No evidence of thrombus. Normal compressibility, respiratory phasicity and response to augmentation. Saphenofemoral Junction: No evidence of thrombus. Normal compressibility and flow on color Doppler imaging. Profunda Femoral Vein: No evidence of thrombus. Normal compressibility and flow on color Doppler imaging. Femoral Vein: No evidence of thrombus. Normal compressibility, respiratory phasicity and response to augmentation. Popliteal Vein: No evidence of thrombus. Normal compressibility, respiratory phasicity and response to augmentation. Calf Veins: No evidence of thrombus. Normal compressibility and flow on color Doppler imaging. Superficial Great Saphenous Vein: No evidence of thrombus. Normal compressibility and flow on color Doppler imaging. Venous Reflux:  None. Other  Findings:  None. IMPRESSION: No evidence of deep venous thrombosis. Electronically Signed   By: Maisie Fus  Register   On: 02/07/2016 12:50   Ct Angio Abd/pel W/ And/or W/o  02/08/2016  CLINICAL DATA:  55 year old female with a history of possible systemic venous obstruction EXAM: CT ANGIOGRAPHY AND VENOGRAPHY CHEST, ABDOMEN AND PELVIS TECHNIQUE: Multidetector CT imaging through the chest, abdomen and pelvis was performed using the standard protocol during bolus administration of intravenous contrast. Multiplanar reconstructed images and MIPs were obtained and reviewed to evaluate the vascular anatomy. CONTRAST:  100 mL Omnipaque 350 COMPARISON:  Prior CT of abdomen and pelvis 06/22/2015; prior CT scan of the chest 02/09/2015 FINDINGS: CTA CHEST FINDINGS Mediastinum: Unremarkable CT appearance of the thyroid gland. No suspicious mediastinal or hilar adenopathy. No soft tissue mediastinal mass. The thoracic esophagus is unremarkable. Heart/Vascular: Pulmonary arterial phase imaging demonstrates excellent opacification of the pulmonary arteries to the proximal subsegmental level. There is no evidence of pulmonary embolus. The main pulmonary artery is enlarged measuring up to 3.6 cm in  diameter. This is larger than the adjacent aorta. The heart is also markedly enlarged, particularly the right heart consistent with cor pulmonale. Case ease calcification is present along the mitral valve annulus. Atherosclerotic calcifications present along the left anterior descending, circumflex and right coronary arteries. No pericardial effusion. Lungs/Pleura: Large layering right pleural effusion with associated right lower lobe atelectasis. No definite pleural enhancement or nodularity. Perhaps mild interlobular septal thickening and ground-glass attenuation airspace opacity in the lungs consistent with mild interstitial edema. Diffuse mild bronchial wall thickening. On the axial images, there is a somewhat nodular opacity in the  medial aspect of the left lower lobe measuring 10 x 8 mm. However, on the coronal and sagittal reformatted images this opacity appears more linear and is therefore strongly favored to represent a region of atelectasis. Bones/Soft Tissues: No acute fracture or aggressive appearing lytic or blastic osseous lesion. Review of the MIP images confirms the above findings. CTA ABDOMEN AND PELVIS FINDINGS VASCULAR Aorta: Normal caliber abdominal aorta without evidence of aneurysm. Scattered atherosclerotic plaque is present throughout. No significant stenosis or evidence of dissection. Celiac: High-grade stenosis versus occlusion of the proximal aspect of the celiac artery secondary to bulky and calcified atherosclerotic plaque. The distal branches reconstitute, possibly through collateralization via the pancreaticoduodenal arcade. SMA: Superior mesenteric artery is widely patent. Prominent pancreaticoduodenal arcade. Renals: The native kidneys are atretic bilaterally. The renal arteries are miniscule and not well seen. IMA: Atretic superior mesenteric artery.  No definite stenosis. Inflow: Calcified atherosclerotic plaque results in focal high-grade stenosis versus short segment occlusion in the left common iliac artery. There is a focal irregular calcified plaque extending into the proximal right common iliac artery resulting in an approximately 50% stenosis. High-grade stenosis of the origin of the left internal iliac artery. The right internal iliac artery is patent. The external iliac arteries are relatively disease free. Proximal Outflow: Diseased bilateral superficial femoral arteries. At least moderate stenosis in the proximal left SFA and mild stenosis in the proximal right SFA. The vessels remain patent into the upper thighs. Veins: Dedicated venous phase imaging demonstrates engorged ment of the suprahepatic IVC. A potentially retrievable IVC filter is present in the infrarenal IVC. This appears to be a [employer]. No evidence of leg penetration, fracture or other complication. No thrombus. The IVC remains patent throughout its course. The iliac and visualized femoral veins are also patent. NON-VASCULAR Abdomen: Abnormal appearance of the liver with enlargement of the left lobe and relative atrophy of the right lobe. Additionally, there is a macronodular contour of the liver surface. Findings raise concern for cirrhotic change versus severe congestive hit the top of the related to chronic right heart failure. No discrete lesion identified. The gallbladder is surgically absent. No intra or extrahepatic biliary ductal dilatation. Unremarkable CT appearance of the stomach, duodenum, spleen, adrenal glands and pancreas. The native kidneys are atrophic. No evidence of obstruction or focal bowel wall thickening. Normal appendix in the right lower quadrant. The terminal ileum is unremarkable. A peritoneal dialysis catheter is coiled in the pelvic recess. There is small volume ascites which is not unexpected. Pelvis: Surgical changes of prior hysterectomy. Mild pelvic floor laxity. Free ascites related to peritoneal dialysis. Bones/Soft Tissues: No acute fracture or aggressive appearing lytic or blastic osseous lesion. The bones appear mildly mottled diffusely. This appearance is favored to represent demineralization/osteopenia. Renal osteodystrophy is also a possibility. Review of the MIP images confirms the above findings. IMPRESSION: VASCULAR 1. Overall vascular findings are most suggestive  of pulmonary arterial hypertension and right-sided heart dysfunction consistent with cor pulmonale. 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 2. No evidence of acute pulmonary embolus, aneurysm, or dissection. 3. Critical stenosis versus short focal occlusion of the origin of the celiac artery. The distal branch vessels  remain patent due in part to collateralization through the pancreaticoduodenal arcade. 4. Atrophic renal arteries bilaterally with associated atrophy of the native kidneys. 5. Peripheral arterial disease with high-grade stenosis versus short segment occlusion of the left common iliac artery, moderate to high-grade stenosis of the proximal right common iliac artery, high-grade stenosis of the origin of the left internal iliac artery, and mild to moderate superficial femoral artery disease (worse on the left than the right) in the visualized segments of the upper thighs. 6. No evidence of venous thrombosis or obstruction. 7. Well-positioned infrarenal potentially retrievable IVC filter. NON VASCULAR 1. Cardiomegaly with right heart enlargement. 2. Mild interstitial pulmonary edema suggests congestive heart failure. 3. Large layering right pleural effusion with associated right lower lobe atelectasis. 4. Dependent atelectasis in the left lower lobe. 5. 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 Other etiologies for cirrhosis including but not limited to chronic hepatitis, alcoholism are not excluded radiographically. 6. Peritoneal dialysis catheter coiled in the pelvis with associated ascites/dialysate. 7. Mild pelvic floor laxity. 8. Surgical changes of prior hysterectomy and cholecystectomy. 9. Diffusely mottled appearance of the bones favored to represent either demineralization/osteopenia or renal osteodystrophy. 10. Atrophic native kidneys. Signed, Sterling Big, MD [facility] Electronically Signed   By: Malachy Moan M.D.   On: 02/08/2016 10:25   US Thoracentesis Asp Pleural Space W/img Guide  03/06/2016  INDICATION: Shortness of breath, large right pleural effusion by CT EXAM: ULTRASOUND GUIDED RIGHT THORACENTESIS MEDICATIONS: 1% lidocaine locally COMPLICATIONS: None immediate. PROCEDURE: An ultrasound guided  thoracentesis was thoroughly discussed with the patient and questions answered. The benefits, risks, alternatives and complications were also discussed. The patient understands and wishes to proceed with the procedure. Written consent was obtained. Ultrasound was performed to localize and mark an adequate pocket of fluid in the right chest. The area was then prepped and draped in the normal sterile fashion. 1% Lidocaine was used for local anesthesia. Under ultrasound guidance a 6 Fr Safe-T-Centesis catheter was introduced. Thoracentesis was performed. The catheter was removed and a dressing applied. FINDINGS: A total of approximately 1.3 L of clear pleural fluid was removed. Samples were sent to the laboratory as requested by the clinical team. IMPRESSION: Successful ultrasound guided right thoracentesis yielding 1.3 L of pleural fluid. Electronically Signed   By: Judie Petit.  Shick M.D.   On: 03/06/2016 09:53    EKG: NSR, 89 bpm, incomplete RBBB, poor R wave progression, prolonged QTc at 511 msec, TWI V2, nonspecific inferior st/t changes  Weights: Filed Weights   03/04/16 1828 03/05/16 0211 03/06/16 0500  Weight: 115 lb (52.164 kg) 110 lb 8 oz (50.122 kg) 109 lb 14.4 oz (49.85 kg)     Physical Exam: Blood pressure 116/84, pulse 70, temperature 97.8 F (36.6 C), temperature source Oral, resp. rate 17, height 5\' 10"  (1.778 m), weight 109 lb 14.4 oz (49.85 kg), SpO2 99 %. Body mass index is 15.77 kg/(m^2). General: Well developed, well nourished, in no acute distress. Head: Normocephalic, atraumatic, sclera non-icteric, no xanthomas, nares are without discharge.  Neck: Negative for carotid bruits. JVD not elevated. Lungs: Clear bilaterally  to auscultation without wheezes, rales, or rhonchi. Breathing is unlabored. Heart: RRR with S1 S2. IV/VI systolic murmurs, no rubs, or gallops appreciated. Abdomen: Soft, non-tender, non-distended with normoactive bowel sounds. No hepatomegaly. No rebound/guarding. No  obvious abdominal masses. Msk:  Strength and tone appear normal for age. Extremities: No clubbing or cyanosis. No edema.  Distal pedal pulses are 2+ and equal bilaterally. Neuro: Alert and oriented X 3. No facial asymmetry. No focal deficit. Moves all extremities spontaneously. Psych:  Responds to questions appropriately with a normal affect.    Assessment and Plan:   1. Pleural effusion: -Likely multifactorial including the patient's mitral valve disease, combined CHF, and ESRD -Status post thoracentesis  -It is likely to be recurrent in the setting of her mitral valve disease -May need to adjust to dialysis settings  2. Mitral valve disease: -Given the above she has likely come to the point of requiring replacement -She has previously been scheduled for TEE though this was cancelled for uncertain reason -Discussed mitral valve disease in detail -She needs TEE and right and left heart cath -Will check with office to evaluate for scheduling of TEE -Cardiac cath can likely be done as an outpatient -Needs referral to [facility]  3. Chronic combined CHF: -EF has returned to normal -Volume managed by dialysis -Continue heart failure medications  4. ESRD: -On Dialysis  5. PAF: -Isolated incident  -Not on long term anticoagulation  -Currently in sinus rhythm  6. History of stroke: -Still with some residual speech difficulties  -Continue current medications, aspirin 81 mg  7. COPD: -Stable  8. Anemia of chronic disease: -Stable  9. HTN: -Continue current medications  10. HLD: -Lipitor  11. Vascular disease: -Cardiac cath as above -Lipitor  -Smoking cessation advised   Signed, Eula Listenyan Anuja Manka, PA-C Pager: [phone] 03/06/2016, 11:20 AM

## 2016-03-06 NOTE — Telephone Encounter (Signed)
Per Dr. Mariah MillingGollan, he would like Dr. Kirke CorinArida to perform TEE tomorrow. S/w Angi in [facility] who is agreeable w/8:30am March 24. S/w Barbara in scheduling. Procedure added. Dr. Mariah MillingGollan and Dr. Kirke CorinArida both aware of time and date.

## 2016-03-06 NOTE — Progress Notes (Signed)
Peritoneal cycle disconnect

## 2016-03-06 NOTE — Progress Notes (Signed)
Pre-peritoneal dialysis connection. 

## 2016-03-06 NOTE — Progress Notes (Signed)
[facility] [place] [facility]   PATIENT NAME: Dorothy Long    MR#:  [id number]  DATE OF BIRTH:  Jan 15, 1961  SUBJECTIVE:  CHIEF COMPLAINT:   Chief Complaint  Patient presents with  . Leg Pain  . Chest Pain  Bilateral chest pain, on and off, no relation with activity is, some cough and shortness of breath.  Found to have pleural effusion, which appears to be chronic.  REVIEW OF SYSTEMS:  CONSTITUTIONAL: No fever, fatigue or weakness.  EYES: No blurred or double vision.  EARS, NOSE, AND THROAT: No tinnitus or ear pain.  RESPIRATORY: No cough, Positive shortness of breath, no wheezing or hemoptysis.  CARDIOVASCULAR: Positive chest pain, no orthopnea, edema.  GASTROINTESTINAL: No nausea, vomiting, diarrhea or abdominal pain.  GENITOURINARY: No dysuria, hematuria.  ENDOCRINE: No polyuria, nocturia,  HEMATOLOGY: No anemia, easy bruising or bleeding SKIN: No rash or lesion. MUSCULOSKELETAL: No joint pain or arthritis.   NEUROLOGIC: No tingling, numbness, weakness.  PSYCHIATRY: No anxiety or depression.   ROS  DRUG ALLERGIES:   Allergies  Allergen Reactions  . Buprenorphine Nausea And Vomiting  . Ciprocinonide [Fluocinolone] Hives  . Demerol [Meperidine] Nausea And Vomiting  . Diflucan [Fluconazole] Nausea And Vomiting  . Flagyl [Metronidazole] Hives and Itching  . Hydrocodone Itching  . Levaquin [Levofloxacin] Other (See Comments)    Reaction: Numbness in legs   . Morphine And Related Nausea And Vomiting  . Tetracyclines & Related Itching and Swelling  . Valium [Diazepam] Nausea And Vomiting and Other (See Comments)    Reaction:  Hallucinations     VITALS:  Blood pressure 124/82, pulse 69, temperature 97.8 F (36.6 C), temperature source Oral, resp. rate 16, height  (1.778 m), weight 49.85 kg (109 lb 14.4 oz), SpO2 97 %.  PHYSICAL EXAMINATION:  GENERAL:  55 y.o.-year-old Thin patient lying in the bed with no acute distress.   EYES: Pupils equal, round, reactive to light and accommodation. No scleral icterus. Extraocular muscles intact.  HEENT: Head atraumatic, normocephalic. Oropharynx and nasopharynx clear.  NECK:  Supple, no jugular venous distention. No thyroid enlargement, no tenderness.  LUNGS: Decreased breath sounds on the right lower, no wheezing, rales,rhonchi or crepitation. No use of accessory muscles of respiration.  CARDIOVASCULAR: S1, S2 normal. No murmurs, rubs, or gallops.  ABDOMEN: Soft, nontender, nondistended. Bowel sounds present. No organomegaly or mass.  EXTREMITIES: No pedal edema, cyanosis, or clubbing.  NEUROLOGIC: Cranial nerves II through XII are intact. Muscle strength 5/5 in all extremities. Sensation intact. Gait not checked.  PSYCHIATRIC: The patient is alert and oriented x 3.  SKIN: No obvious rash, lesion, or ulcer.   Physical Exam LABORATORY PANEL:   CBC  Recent Labs Lab 03/05/16 0413  WBC 4.4  HGB 8.1*  HCT 24.9*  PLT 147*   ------------------------------------------------------------------------------------------------------------------  Chemistries   Recent Labs Lab 03/06/16 0657  NA 131*  K 3.7  CL 98*  CO2 24  GLUCOSE 82  BUN 52*  CREATININE 8.18*  CALCIUM 7.8*  AST 20  ALT 16  ALKPHOS 83  BILITOT 0.5   ------------------------------------------------------------------------------------------------------------------  Cardiac Enzymes  Recent Labs Lab 03/05/16 0617 03/05/16 1235  TROPONINI 0.04* 0.05*   ------------------------------------------------------------------------------------------------------------------  RADIOLOGY:  Dg Chest 2 View  03/04/2016  CLINICAL DATA:  Right lower leg pain and swelling for 5 days. Right-sided chest pain starting yesterday. EXAM: CHEST  2 VIEW COMPARISON:  12/18/2015 FINDINGS: Cardiac enlargement with mild central pulmonary vascular congestion suggesting arterial hypertension. Calcification projected  over  the heart corresponding calcification in the mitral valve annulus on prior CT. Mediastinal contours appear intact. There is increasing right pleural effusion with basilar atelectasis or consolidation in the right lung since the previous study. No pneumothorax. Vena caval filter and surgical clips demonstrated in the right upper quadrant. IMPRESSION: Cardiac enlargement with central pulmonary vascular congestion. Increasing right pleural effusion with basilar infiltration or atelectasis. Electronically Signed   By: Burman NievesWilliam  Stevens M.D.   On: 03/04/2016 19:40   Ct Chest Wo Contrast  03/05/2016  CLINICAL DATA:  End-stage renal disease with right pleural effusion and chronic congestive heart failure. EXAM: CT CHEST WITHOUT CONTRAST TECHNIQUE: Multidetector CT imaging of the chest was performed following the standard protocol without IV contrast. COMPARISON:  Chest CT February 08, 2016; chest radiograph March 04, 2016 FINDINGS: Mediastinum/Lymph Nodes: Thyroid appears unremarkable. There are subcentimeter mediastinal lymph nodes but no adenopathy by size criteria. There is no appreciable thoracic aortic aneurysm. Visualized great vessels appear unremarkable. There is generalized cardiomegaly with primarily right heart enlargement. There is a small pericardial effusion. There are multiple foci of coronary artery calcification. There is extensive mitral valve calcification. There is prominence of the central pulmonary arteries with rapid peripheral tapering. Lungs/Pleura: There remains a sizable right pleural effusion with atelectatic change in the right lower lobe. There is scarring in the lung bases. There is a rather minimal left pleural effusion with mild left base atelectasis. The somewhat nodular appearing opacity on axial images in the medial left base on prior study appears consistent with localized atelectasis at this time. Upper abdomen: In the visualized upper abdomen, kidneys are atrophic. Liver has a  somewhat nodular contour, concerning for a degree of underlying cirrhosis. There is a filter in the inferior vena cava. There is calcification in the visualized abdominal aorta. Gallbladder is absent. Musculoskeletal: Bones are somewhat sclerotic consistent with chronic renal failure. No lytic bone lesions are evident. IMPRESSION: There is a persistent sizable right pleural effusion with right base atelectasis. There is a minimal left pleural effusion with patchy scarring and atelectasis in the left base region. Small pericardial effusion. Changes indicative of a degree of cor pulmonale, stable. Extensive mitral valve and annulus calcification. There are multiple foci of coronary artery calcification. No adenopathy. Bony changes of chronic renal failure. Filter in the inferior vena cava. Kidneys atrophic. Liver has a somewhat nodular contour, concerning for a degree of underlying hepatic cirrhosis. Electronically Signed   By: Bretta BangWilliam  Woodruff III M.D.   On: 03/05/2016 15:47   [place] Venous Img Lower Unilateral Right  03/04/2016  CLINICAL DATA:  Pain and swelling in the right leg for 2 weeks. Previous history of deep venous thrombosis. Aspirin anticoagulation therapy. EXAM: Right LOWER EXTREMITY VENOUS DOPPLER ULTRASOUND TECHNIQUE: Gray-scale sonography with graded compression, as well as color Doppler and duplex ultrasound were performed to evaluate the lower extremity deep venous systems from the level of the common femoral vein and including the common femoral, femoral, profunda femoral, popliteal and calf veins including the posterior tibial, peroneal and gastrocnemius veins when visible. The superficial great saphenous vein was also interrogated. Spectral Doppler was utilized to evaluate flow at rest and with distal augmentation maneuvers in the common femoral, femoral and popliteal veins. COMPARISON:  None. FINDINGS: Contralateral Common Femoral Vein: Respiratory phasicity is normal and symmetric with the  symptomatic side. No evidence of thrombus. Normal compressibility. Common Femoral Vein: No evidence of thrombus. Normal compressibility, respiratory phasicity and response to augmentation. Saphenofemoral Junction: No evidence of  thrombus. Normal compressibility and flow on color Doppler imaging. Profunda Femoral Vein: No evidence of thrombus. Normal compressibility and flow on color Doppler imaging. Femoral Vein: No evidence of thrombus. Normal compressibility, respiratory phasicity and response to augmentation. Popliteal Vein: No evidence of thrombus. Normal compressibility, respiratory phasicity and response to augmentation. Calf Veins: No evidence of thrombus. Normal compressibility and flow on color Doppler imaging. Superficial Great Saphenous Vein: No evidence of thrombus. Normal compressibility and flow on color Doppler imaging. Venous Reflux:  None. Other Findings:  None. IMPRESSION: No evidence of deep venous thrombosis. Electronically Signed   By: Burman Nieves M.D.   On: 03/04/2016 19:31    ASSESSMENT AND PLAN:   Principal Problem:   Chest pain Active Problems:   Hyperlipidemia   Chronic kidney disease with peritoneal dialysis as preferred modality, stage 5 (HCC)   GERD (gastroesophageal reflux disease)   Paroxysmal atrial fibrillation (HCC)   Chronic combined systolic and diastolic CHF (congestive heart failure) (HCC)   * Chest pain -  cycled her enzymes, stable, echocardiogram was done recently so no need to repeat.   * Pleural effusion   Most likely this is the reason for the chest pain.   She did not had pulmonary embolism as checked last month with CT angiogram   Right leg DVT study is done this time and it is negative.   I called pulmonary consult to help with this pleural effusion.   We'll get a CT chest without contrast today.  *  Chronic kidney disease with peritoneal dialysis as preferred modality, stage 5 [facility]) - nephrology consult for assistance with dialysis and  overall assessment of fluid status.  * Chronic combined systolic and diastolic CHF (congestive heart failure) (HCC) - continue home meds * Hyperlipidemia - continue home meds * GERD (gastroesophageal reflux disease) - home dose PPI * Paroxysmal atrial fibrillation (HCC) - currently in sinus rhythm, monitor with telemetry    All the records are reviewed and case discussed with Care Management/Social Workerr. Management plans discussed with the patient, family and they are in agreement.  CODE STATUS: Full  TOTAL TIME TAKING CARE OF THIS PATIENT: 40 minutes.   Plan discussed with patient, her husband in the room and with Dr. Ardyth Man for consult  POSSIBLE D/C IN 2-3 DAYS, DEPENDING ON CLINICAL CONDITION.   Altamese Dilling M.D on 03/06/2016   Between 7am to 6pm - Pager - [phone]  After 6pm go to [email or web address] - [email or web address]  [facility]  Office  [phone]  CC: Primary care physician; Sherlene Shams, MD  Note: This dictation was prepared with Dragon dictation along with smaller phrase technology. Any transcriptional errors that result from this process are unintentional.

## 2016-03-07 ENCOUNTER — Observation Stay (HOSPITAL_COMMUNITY)
Admit: 2016-03-07 | Discharge: 2016-03-07 | Disposition: A | Discharge status: Home / Self Care | Payer: BC Managed Care – PPO | Attending: Cardiovascular Disease | Admitting: Cardiovascular Disease

## 2016-03-07 ENCOUNTER — Encounter
Admission: EM | Disposition: A | Discharge status: Other Acute Inpatient Hospital | Payer: Self-pay | Source: Home / Self Care | Attending: Internal Medicine

## 2016-03-07 DIAGNOSIS — J449 Chronic obstructive pulmonary disease, unspecified: Secondary | ICD-10-CM | POA: Diagnosis present

## 2016-03-07 DIAGNOSIS — E785 Hyperlipidemia, unspecified: Secondary | ICD-10-CM | POA: Diagnosis present

## 2016-03-07 DIAGNOSIS — I69351 Hemiplegia and hemiparesis following cerebral infarction affecting right dominant side: Secondary | ICD-10-CM | POA: Diagnosis not present

## 2016-03-07 DIAGNOSIS — N269 Renal sclerosis, unspecified: Secondary | ICD-10-CM | POA: Diagnosis present

## 2016-03-07 DIAGNOSIS — E1122 Type 2 diabetes mellitus with diabetic chronic kidney disease: Secondary | ICD-10-CM | POA: Diagnosis present

## 2016-03-07 DIAGNOSIS — Z7982 Long term (current) use of aspirin: Secondary | ICD-10-CM | POA: Diagnosis not present

## 2016-03-07 DIAGNOSIS — Z79899 Other long term (current) drug therapy: Secondary | ICD-10-CM | POA: Diagnosis not present

## 2016-03-07 DIAGNOSIS — Z881 Allergy status to other antibiotic agents status: Secondary | ICD-10-CM | POA: Diagnosis not present

## 2016-03-07 DIAGNOSIS — I083 Combined rheumatic disorders of mitral, aortic and tricuspid valves: Secondary | ICD-10-CM | POA: Diagnosis present

## 2016-03-07 DIAGNOSIS — J90 Pleural effusion, not elsewhere classified: Secondary | ICD-10-CM | POA: Diagnosis present

## 2016-03-07 DIAGNOSIS — I2510 Atherosclerotic heart disease of native coronary artery without angina pectoris: Secondary | ICD-10-CM | POA: Diagnosis present

## 2016-03-07 DIAGNOSIS — Z888 Allergy status to other drugs, medicaments and biological substances status: Secondary | ICD-10-CM | POA: Diagnosis not present

## 2016-03-07 DIAGNOSIS — I340 Nonrheumatic mitral (valve) insufficiency: Secondary | ICD-10-CM

## 2016-03-07 DIAGNOSIS — I050 Rheumatic mitral stenosis: Secondary | ICD-10-CM | POA: Diagnosis not present

## 2016-03-07 DIAGNOSIS — Z885 Allergy status to narcotic agent status: Secondary | ICD-10-CM | POA: Diagnosis not present

## 2016-03-07 DIAGNOSIS — D638 Anemia in other chronic diseases classified elsewhere: Secondary | ICD-10-CM | POA: Diagnosis present

## 2016-03-07 DIAGNOSIS — I480 Paroxysmal atrial fibrillation: Secondary | ICD-10-CM | POA: Diagnosis present

## 2016-03-07 DIAGNOSIS — N186 End stage renal disease: Secondary | ICD-10-CM | POA: Diagnosis present

## 2016-03-07 DIAGNOSIS — I132 Hypertensive heart and chronic kidney disease with heart failure and with stage 5 chronic kidney disease, or end stage renal disease: Secondary | ICD-10-CM | POA: Diagnosis present

## 2016-03-07 DIAGNOSIS — K210 Gastro-esophageal reflux disease with esophagitis: Secondary | ICD-10-CM | POA: Diagnosis present

## 2016-03-07 DIAGNOSIS — M7989 Other specified soft tissue disorders: Secondary | ICD-10-CM | POA: Diagnosis present

## 2016-03-07 DIAGNOSIS — Z992 Dependence on renal dialysis: Secondary | ICD-10-CM | POA: Diagnosis not present

## 2016-03-07 DIAGNOSIS — I5042 Chronic combined systolic (congestive) and diastolic (congestive) heart failure: Secondary | ICD-10-CM | POA: Diagnosis present

## 2016-03-07 DIAGNOSIS — I700 Atherosclerosis of aorta: Secondary | ICD-10-CM | POA: Diagnosis present

## 2016-03-07 DIAGNOSIS — N2581 Secondary hyperparathyroidism of renal origin: Secondary | ICD-10-CM | POA: Diagnosis present

## 2016-03-07 DIAGNOSIS — I272 Other secondary pulmonary hypertension: Secondary | ICD-10-CM | POA: Diagnosis present

## 2016-03-07 DIAGNOSIS — Z8249 Family history of ischemic heart disease and other diseases of the circulatory system: Secondary | ICD-10-CM | POA: Diagnosis not present

## 2016-03-07 DIAGNOSIS — R4701 Aphasia: Secondary | ICD-10-CM | POA: Diagnosis present

## 2016-03-07 LAB — CBC
HEMATOCRIT: 24.3 % — AB (ref 35.0–47.0)
Hemoglobin: 7.9 g/dL — ABNORMAL LOW (ref 12.0–16.0)
MCH: 27.2 pg (ref 26.0–34.0)
MCHC: 32.5 g/dL (ref 32.0–36.0)
MCV: 83.5 fL (ref 80.0–100.0)
PLATELETS: 130 10*3/uL — AB (ref 150–440)
RBC: 2.91 MIL/uL — AB (ref 3.80–5.20)
RDW: 16.8 % — AB (ref 11.5–14.5)
WBC: 4.7 10*3/uL (ref 3.6–11.0)

## 2016-03-07 LAB — BASIC METABOLIC PANEL
ANION GAP: 12 (ref 5–15)
BUN: 49 mg/dL — ABNORMAL HIGH (ref 6–20)
CO2: 24 mmol/L (ref 22–32)
Calcium: 7.5 mg/dL — ABNORMAL LOW (ref 8.9–10.3)
Chloride: 99 mmol/L — ABNORMAL LOW (ref 101–111)
Creatinine, Ser: 8.22 mg/dL — ABNORMAL HIGH (ref 0.44–1.00)
GFR calc non Af Amer: 5 mL/min — ABNORMAL LOW (ref >60)
GFR, EST AFRICAN AMERICAN: 6 mL/min — AB (ref >60)
Glucose, Bld: 88 mg/dL (ref 65–99)
POTASSIUM: 3.9 mmol/L (ref 3.5–5.1)
Sodium: 135 mmol/L (ref 135–145)

## 2016-03-07 LAB — CYTOLOGY - NON PAP

## 2016-03-07 LAB — MISC LABCORP TEST (SEND OUT): LABCORP TEST CODE: 19588

## 2016-03-07 SURGERY — ECHOCARDIOGRAM, TRANSESOPHAGEAL
Anesthesia: Moderate Sedation

## 2016-03-07 MED ORDER — FENTANYL CITRATE (PF) 100 MCG/2ML IJ SOLN
INTRAMUSCULAR | Status: AC
  Filled 2016-03-07: qty 4

## 2016-03-07 MED ORDER — BUTAMBEN-TETRACAINE-BENZOCAINE 2-2-14 % EX AERO
INHALATION_SPRAY | CUTANEOUS | Status: AC
  Filled 2016-03-07: qty 20

## 2016-03-07 MED ORDER — SENNOSIDES-DOCUSATE SODIUM 8.6-50 MG PO TABS
1.0000 | ORAL_TABLET | Freq: Two times a day (BID) | ORAL | Status: DC
  Administered 2016-03-08 – 2016-03-11 (×6): 1 via ORAL
  Filled 2016-03-07 (×7): qty 1

## 2016-03-07 MED ORDER — MIDAZOLAM HCL 2 MG/2ML IJ SOLN
INTRAMUSCULAR | Status: AC
  Filled 2016-03-07: qty 4

## 2016-03-07 MED ORDER — LIDOCAINE VISCOUS 2 % MT SOLN
OROMUCOSAL | Status: AC
  Filled 2016-03-07: qty 15

## 2016-03-07 NOTE — Progress Notes (Signed)
*  PRELIMINARY RESULTS* Echocardiogram Echocardiogram Transesophageal has been performed.  Georgann HousekeeperJerry R Hege 03/07/2016, 9:20 AM

## 2016-03-07 NOTE — Progress Notes (Signed)
Versed 1mg IV given. 

## 2016-03-07 NOTE — Discharge Summary (Signed)
[facility] [place] at [facility]   PATIENT NAME: Dorothy Long    MR#:  [id number]  DATE OF BIRTH:  05-Apr-1961  DATE OF ADMISSION:  03/04/2016 ADMITTING PHYSICIAN: Oralia Manis, MD  DATE OF DISCHARGE: 03/07/2016  PRIMARY CARE PHYSICIAN: Sherlene Shams, MD    ADMISSION DIAGNOSIS:  Pleural effusion [J90] Right leg swelling [M79.89] Chest pain, unspecified chest pain type [R07.9]  DISCHARGE DIAGNOSIS:  Principal Problem:   Chest pain Active Problems:   Hyperlipidemia   Chronic kidney disease with peritoneal dialysis as preferred modality, stage 5 (HCC)   GERD (gastroesophageal reflux disease)   Paroxysmal atrial fibrillation (HCC)   Chronic combined systolic and diastolic CHF (congestive heart failure) (HCC)   Mitral valve stenosis   Pleural effusion on right   Shortness of breath   Mitral valve stenosis, severe   Coronary artery disease involving native coronary artery of native heart without angina pectoris   Aortic arch atherosclerosis (HCC)   SECONDARY DIAGNOSIS:   Past Medical History  Diagnosis Date  . Hypertension   . Hyperlipidemia   . COPD (chronic obstructive pulmonary disease) (HCC)     a. not on home O2  . Anemia   . Valvular disease     a. 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 echo 2015: EF 45-50%, borderline LVH, mod dilated LA, mildly dilated RA, small pericardial effusion, mod MR (rheumatic deformity), MS mild-mod, no gradient, PASP ele  . Chronic combined systolic and diastolic CHF (congestive heart failure) (HCC)     a. echo reports above  . PAF (paroxysmal atrial fibrillation) (HCC)     a. in setting of diarrhea 09/21/2013; b. not on long term full dose anticoagulation; c. CHADSVASC at least 4 (CHF, HTN, DM, female)  . Diabetes mellitus (HCC)    . Peritoneal dialysis status (HCC) 4- 15-15  . Ventral hernia   . Inguinal hernia   . History of stress test     a. 2014: no convincing evidence of pharmacologically induced ischemia,  apparent ant apical defect present only on attenuation corrected images favored to reflect artifact due to overcorrection & may be worse on stress imaging secondary to greater patient motion. A small area of ischemia is felt less likely but is difficult to entirely exclude, EF 55%  . ESRD (end stage renal disease) on dialysis (HCC)     a. HD on T,T,S; b. 2/2 focal segmental glomerulosclerosis   . Dialysis patient (HCC)   . Renal insufficiency   . Stroke [facility]) 11/2015    a. MRI L cereblal infarctions in the MCA/PCA, ACA/MCA territory; b. felt to be carotid in etiology; c. medically managed on DAPT per neuro    HOSPITAL COURSE:   * Chest pain -  cycled her enzymes, stable, echocardiogram was done recently so no need to repeat.   As pt have Mitral stenosis, pulmonary Htn, calcification of Coronaries per CT chest and severe stenosis on intraabdominal arteries and renal artery- suggesting severe arterial disease- called Cardio consult.  They planned for EEG     EEG result and recommendation as below-   - TEE was performed today. However, the images were unfortunately significant calcium artifact. Ejection fraction was 50%, mitral valve annulus was heavily calcified with severely thickened and calcified mitral valve leaflets and what seems to be  a calcified mass likely an old vegetation. There is at least moderate to severe mitral stenosis with a mean gradient of 10 mmHg at rest. There was moderate eccentric regurgitation I did review the previous transthoracic echocardiogram from February 2017. The image quality was actually good with a mean gradient across the mitral valve of 13 mmHg with a mitral valve area of 0.5 cm. The IVC was severely dilated with an estimated systolic pulmonary pressure of 65 mm. There  was severe biatrial enlargement.  Based on the above, I recommend mitral valve replacement. Given that she has been deemed to be a high risk for anticoagulation, she probably should get a bioprosthetic valve with clipping of the atrial appendage. I discussed this with the patient and the family. They want her to have this done at [facility]  * Pleural effusion  Most likely this is the reason for the chest pain.  She did not had pulmonary embolism as checked last month with CT angiogram  Right leg DVT study is done this time and it is negative.Left leg DVT- negative.  I called pulmonary consult to help with this pleural effusion.  S/p thoracentesis 03/06/16- mostly transudative fluid.  * Chronic kidney disease with peritoneal dialysis as preferred modality, stage 5 [facility] - nephrology consult for assistance with dialysis and overall assessment of fluid status.  * Chronic combined systolic and diastolic CHF (congestive heart failure) (HCC) - continue home meds * Hyperlipidemia - continue home meds * GERD (gastroesophageal reflux disease) - home dose PPI * Paroxysmal atrial fibrillation (HCC) - currently in sinus rhythm, monitor with telemetry   DISCHARGE CONDITIONS:   Stable.  CONSULTS OBTAINED:  Treatment Team:  Mady Haagensen, MD Antonieta Iba, MD Sondra Barges, PA-C  DRUG ALLERGIES:   Allergies  Allergen Reactions  . Buprenorphine Nausea And Vomiting  . Ciprocinonide [Fluocinolone] Hives  . Demerol [Meperidine] Nausea And Vomiting  . Diflucan [Fluconazole] Nausea And Vomiting  . Flagyl [Metronidazole] Hives and Itching  . Hydrocodone Itching  . Levaquin [Levofloxacin] Other (See Comments)    Reaction: Numbness in legs   . Morphine And Related Nausea And Vomiting  . Tetracyclines & Related Itching and Swelling  . Valium [Diazepam] Nausea And Vomiting and Other (See Comments)    Reaction:  Hallucinations     DISCHARGE MEDICATIONS:   Current Discharge Medication  List    CONTINUE these medications which have NOT CHANGED   Details  acetaminophen (TYLENOL) 325 MG tablet Take 650 mg by mouth every 4 (four) hours as needed for mild pain or headache.     albuterol (PROVENTIL HFA;VENTOLIN HFA) 108 (90 Base) MCG/ACT inhaler Inhale 2 puffs into the lungs every 6 (six) hours as needed for wheezing or shortness of breath.    aspirin 81 MG chewable tablet Chew 81 mg by mouth daily.    atorvastatin (LIPITOR) 80 MG tablet Take 80 mg by mouth daily.     calcitRIOL (ROCALTROL) 0.5 MCG capsule Take 0.5 mcg by mouth daily.     calcium acetate (PHOSLO) 667 MG capsule Take 2,001 mg by mouth 3 (three) times daily with meals.    cinacalcet (SENSIPAR) 30 MG tablet Take 30 mg by mouth daily.    docusate sodium (COLACE) 100 MG capsule Take 100 mg by mouth daily as needed for mild constipation.     ondansetron (ZOFRAN-ODT) 4 MG disintegrating tablet Take 4 mg by mouth every 8 (eight) hours as needed for nausea or vomiting.     pantoprazole (PROTONIX) 40  MG tablet Take 40 mg by mouth 2 (two) times daily.     sodium chloride 1 g tablet Take 2 g by mouth 2 (two) times daily with a meal.    sucralfate (CARAFATE) 1 GM/10ML suspension Take 10 mLs (1 g total) by mouth every 6 (six) hours. Qty: 420 mL, Refills: 0    tiotropium (SPIRIVA) 18 MCG inhalation capsule Place 1 capsule (18 mcg total) into inhaler and inhale daily. Qty: 30 capsule, Refills: 0         DISCHARGE INSTRUCTIONS:    Follow with [facility] cardiology  If you experience worsening of your admission symptoms, develop shortness of breath, life threatening emergency, suicidal or homicidal thoughts you must seek medical attention immediately by calling 911 or calling your MD immediately  if symptoms less severe.  You Must read complete instructions/literature along with all the possible adverse reactions/side effects for all the Medicines you take and that have been prescribed to you. Take any new Medicines  after you have completely understood and accept all the possible adverse reactions/side effects.   Please note  You were cared for by a hospitalist during your hospital stay. If you have any questions about your discharge medications or the care you received while you were in the hospital after you are discharged, you can call the unit and asked to speak with the hospitalist on call if the hospitalist that took care of you is not available. Once you are discharged, your primary care physician will handle any further medical issues. Please note that NO REFILLS for any discharge medications will be authorized once you are discharged, as it is imperative that you return to your primary care physician (or establish a relationship with a primary care physician if you do not have one) for your aftercare needs so that they can reassess your need for medications and monitor your lab values.    Today   CHIEF COMPLAINT:   Chief Complaint  Patient presents with  . Leg Pain  . Chest Pain    HISTORY OF PRESENT ILLNESS:  Labrittany Wechter  is a 55 y.o. female presents with chest pain for the last week and increasing right lower extremity swelling and pain for the last several days. The symptoms finally progressed to the point that she was brought in the ED by her husband. Here she was found on chest x-ray to have some vascular congestion as well as an increase in the size of her right pleural effusion, though it is still relatively small in size. Ultrasound of her lower extremity ruled out DVT. Her troponin was mildly elevated at 0.06, though this is in the setting of chronic heart failure and renal disease. Patient is also had a stroke in the past and is somewhat difficult to obtain information from for the history of present illness. Her husband is present with her in the ED and provides collateral information. Given the persistence of her chest pain and her overall risk factors, hospitalists were called for  admission.   VITAL SIGNS:  Blood pressure 116/83, pulse 72, temperature 97.8 F (36.6 C), temperature source Oral, resp. rate 16, height 5\' 10"  (1.778 m), weight 47.2 kg (104 lb 0.9 oz), SpO2 98 %.  I/O:   Intake/Output Summary (Last 24 hours) at 03/07/16 1415 Last data filed at 03/07/16 1100  Gross per 24 hour  Intake      0 ml  Output    983 ml  Net   -983 ml  PHYSICAL EXAMINATION:   GENERAL: 55 y.o.-year-old Thin patient lying in the bed with no acute distress.  EYES: Pupils equal, round, reactive to light and accommodation. No scleral icterus. Extraocular muscles intact.  HEENT: Head atraumatic, normocephalic. Oropharynx and nasopharynx clear.  NECK: Supple, no jugular venous distention. No thyroid enlargement, no tenderness.  LUNGS: Decreased breath sounds on the right lower, no wheezing, rales,rhonchi or crepitation. No use of accessory muscles of respiration.  CARDIOVASCULAR: S1, S2 normal. No murmurs, rubs, or gallops.  ABDOMEN: Soft, nontender, nondistended. Bowel sounds present. No organomegaly or mass.  EXTREMITIES: No pedal edema, cyanosis, or clubbing.  NEUROLOGIC: Cranial nerves II through XII are intact. Muscle strength 5/5 in all extremities. Sensation intact. Gait not checked.  PSYCHIATRIC: The patient is alert and oriented x 3.  SKIN: No obvious rash, lesion, or ulcer.   DATA REVIEW:   CBC  Recent Labs Lab 03/07/16 0431  WBC 4.7  HGB 7.9*  HCT 24.3*  PLT 130*    Chemistries   Recent Labs Lab 03/06/16 0657 03/07/16 0431  NA 131* 135  K 3.7 3.9  CL 98* 99*  CO2 24 24  GLUCOSE 82 88  BUN 52* 49*  CREATININE 8.18* 8.22*  CALCIUM 7.8* 7.5*  AST 20  --   ALT 16  --   ALKPHOS 83  --   BILITOT 0.5  --     Cardiac Enzymes  Recent Labs Lab 03/05/16 1235  TROPONINI 0.05*    Microbiology Results  Results for orders placed or performed during the hospital encounter of 03/04/16  Culture, body fluid-bottle     Status: None  (Preliminary result)   Collection Time: 03/06/16  9:35 AM  Result Value Ref Range Status   Specimen Description PLEURAL  Final   Special Requests NONE  Final   Gram Stain NO ORGANISMS SEEN  Final   Culture PENDING  Incomplete   Report Status PENDING  Incomplete    RADIOLOGY:  Dg Chest 1 View  03/06/2016  CLINICAL DATA:  Post right thoracentesis EXAM: CHEST 1 VIEW COMPARISON:  03/04/2016 FINDINGS: Marked cardiomegaly with central vascular congestion. Resolution of the right effusion following thoracentesis. No pneumothorax. Minor bibasilar atelectasis. Heavy calcification noted of the mitral valve. Trachea is midline. IMPRESSION: Negative for pneumothorax following thoracentesis. Cardiomegaly without CHF Extensive mitral valve calcifications. Electronically Signed   By: Judie Petit.  Shick M.D.   On: 03/06/2016 11:13   Ct Chest Wo Contrast  03/05/2016  CLINICAL DATA:  End-stage renal disease with right pleural effusion and chronic congestive heart failure. EXAM: CT CHEST WITHOUT CONTRAST TECHNIQUE: Multidetector CT imaging of the chest was performed following the standard protocol without IV contrast. COMPARISON:  Chest CT February 08, 2016; chest radiograph March 04, 2016 FINDINGS: Mediastinum/Lymph Nodes: Thyroid appears unremarkable. There are subcentimeter mediastinal lymph nodes but no adenopathy by size criteria. There is no appreciable thoracic aortic aneurysm. Visualized great vessels appear unremarkable. There is generalized cardiomegaly with primarily right heart enlargement. There is a small pericardial effusion. There are multiple foci of coronary artery calcification. There is extensive mitral valve calcification. There is prominence of the central pulmonary arteries with rapid peripheral tapering. Lungs/Pleura: There remains a sizable right pleural effusion with atelectatic change in the right lower lobe. There is scarring in the lung bases. There is a rather minimal left pleural effusion with mild  left base atelectasis. The somewhat nodular appearing opacity on axial images in the medial left base on prior study appears consistent with localized atelectasis at  this time. Upper abdomen: In the visualized upper abdomen, kidneys are atrophic. Liver has a somewhat nodular contour, concerning for a degree of underlying cirrhosis. There is a filter in the inferior vena cava. There is calcification in the visualized abdominal aorta. Gallbladder is absent. Musculoskeletal: Bones are somewhat sclerotic consistent with chronic renal failure. No lytic bone lesions are evident. IMPRESSION: There is a persistent sizable right pleural effusion with right base atelectasis. There is a minimal left pleural effusion with patchy scarring and atelectasis in the left base region. Small pericardial effusion. Changes indicative of a degree of cor pulmonale, stable. Extensive mitral valve and annulus calcification. There are multiple foci of coronary artery calcification. No adenopathy. Bony changes of chronic renal failure. Filter in the inferior vena cava. Kidneys atrophic. Liver has a somewhat nodular contour, concerning for a degree of underlying hepatic cirrhosis. Electronically Signed   By: Bretta BangWilliam  Woodruff III M.D.   On: 03/05/2016 15:47   [place] Venous Img Lower Unilateral Left  03/06/2016  CLINICAL DATA:  Shortness of breath.  History of DVT. EXAM: LEFT LOWER EXTREMITY VENOUS DOPPLER ULTRASOUND TECHNIQUE: Gray-scale sonography with graded compression, as well as color Doppler and duplex ultrasound were performed to evaluate the lower extremity deep venous systems from the level of the common femoral vein and including the common femoral, femoral, profunda femoral, popliteal and calf veins including the posterior tibial, peroneal and gastrocnemius veins when visible. The superficial great saphenous vein was also interrogated. Spectral Doppler was utilized to evaluate flow at rest and with distal augmentation maneuvers in the  common femoral, femoral and popliteal veins. COMPARISON:  None. FINDINGS: Contralateral Common Femoral Vein: Respiratory phasicity is normal and symmetric with the symptomatic side. No evidence of thrombus. Normal compressibility. Common Femoral Vein: No evidence of thrombus. Normal compressibility, respiratory phasicity and response to augmentation. Saphenofemoral Junction: No evidence of thrombus. Normal compressibility and flow on color Doppler imaging. Profunda Femoral Vein: No evidence of thrombus. Normal compressibility and flow on color Doppler imaging. Femoral Vein: No evidence of thrombus. Normal compressibility, respiratory phasicity and response to augmentation. Popliteal Vein: No evidence of thrombus. Normal compressibility, respiratory phasicity and response to augmentation. Calf Veins: No evidence of thrombus. Normal compressibility and flow on color Doppler imaging. Superficial Great Saphenous Vein: No evidence of thrombus. Normal compressibility and flow on color Doppler imaging. Other Findings:  None. IMPRESSION: No evidence of deep venous thrombosis. Electronically Signed   By: Maisie Fushomas  Register   On: 03/06/2016 11:20   [place] Thoracentesis Asp Pleural Space W/img Guide  03/06/2016  INDICATION: Shortness of breath, large right pleural effusion by CT EXAM: ULTRASOUND GUIDED RIGHT THORACENTESIS MEDICATIONS: 1% lidocaine locally COMPLICATIONS: None immediate. PROCEDURE: An ultrasound guided thoracentesis was thoroughly discussed with the patient and questions answered. The benefits, risks, alternatives and complications were also discussed. The patient understands and wishes to proceed with the procedure. Written consent was obtained. Ultrasound was performed to localize and mark an adequate pocket of fluid in the right chest. The area was then prepped and draped in the normal sterile fashion. 1% Lidocaine was used for local anesthesia. Under ultrasound guidance a 6 Fr Safe-T-Centesis catheter was  introduced. Thoracentesis was performed. The catheter was removed and a dressing applied. FINDINGS: A total of approximately 1.3 L of clear pleural fluid was removed. Samples were sent to the laboratory as requested by the clinical team. IMPRESSION: Successful ultrasound guided right thoracentesis yielding 1.3 L of pleural fluid. Electronically Signed   By: Judie PetitM.  Shick M.D.  On: 03/06/2016 09:53     Management plans discussed with the patient, family and they are in agreement.  CODE STATUS: Full.    Code Status Orders        Start     Ordered   03/05/16 0210  Full code   Continuous     03/05/16 0209    Code Status History    Date Active Date Inactive Code Status Order ID Comments User Context   02/06/2016 10:18 PM 02/12/2016  4:38 PM DNR [id number]  Milagros Loll, MD ED   12/11/2015  3:28 AM 12/19/2015  4:53 PM Full Code [id number]  Ihor Austin, MD Inpatient   11/17/2015  4:50 PM 11/20/2015  3:18 PM Full Code [id number]  Houston Siren, MD Inpatient   10/11/2015  9:32 PM 10/12/2015  5:10 PM Full Code [id number]  Oralia Manis, MD Inpatient   06/23/2015 12:43 AM 06/24/2015  2:21 PM Full Code [id number]  Oralia Manis, MD Inpatient   06/15/2015  2:57 AM 06/16/2015  4:01 PM Full Code [id number]  Wyatt Haste, MD ED      TOTAL TIME TAKING CARE OF THIS PATIENT: 50 minutes.    Altamese Dilling M.D on 03/07/2016 at 2:15 PM  Between 7am to 6pm - Pager - [phone]  After 6pm go to [email or web address] - password [email or web address]  [facility]  Office  [phone]  CC: Primary care physician; Sherlene Shams, MD   Note: This dictation was prepared with Dragon dictation along with smaller phrase technology. Any transcriptional errors that result from this process are unintentional.

## 2016-03-07 NOTE — Care Management (Signed)
[facility] Secretary has printed off all transfer packet.

## 2016-03-07 NOTE — Progress Notes (Signed)
SUBJECTIVE:  She reports improvement in shortness of breath. She underwent transesophageal echocardiogram today.   Filed Vitals:   03/07/16 0916 03/07/16 0922 03/07/16 0927 03/07/16 0935  BP: 86/68 92/69 87/69  107/78  Pulse: 81 85 73 75  Temp:      TempSrc:      Resp: 11 10 11 13   Height:      Weight:      SpO2: 91% 89% 91% 95%    Intake/Output Summary (Last 24 hours) at 03/07/16 0943 Last data filed at 03/07/16 0900  Gross per 24 hour  Intake      0 ml  Output    983 ml  Net   -983 ml    LABS: Basic Metabolic Panel:  Recent Labs  16/09/9602/23/17 0657 03/07/16 0431  NA 131* 135  K 3.7 3.9  CL 98* 99*  CO2 24 24  GLUCOSE 82 88  BUN 52* 49*  CREATININE 8.18* 8.22*  CALCIUM 7.8* 7.5*   Liver Function Tests:  Recent Labs  03/06/16 0657  AST 20  ALT 16  ALKPHOS 83  BILITOT 0.5  PROT 5.3*  ALBUMIN 2.1*   No results for input(s): LIPASE, AMYLASE in the last 72 hours. CBC:  Recent Labs  03/05/16 0413 03/07/16 0431  WBC 4.4 4.7  HGB 8.1* 7.9*  HCT 24.9* 24.3*  MCV 84.1 83.5  PLT 147* 130*   Cardiac Enzymes:  Recent Labs  03/05/16 0035 03/05/16 0617 03/05/16 1235  TROPONINI 0.05* 0.04* 0.05*   BNP: Invalid input(s): POCBNP D-Dimer: No results for input(s): DDIMER in the last 72 hours. Hemoglobin A1C: No results for input(s): HGBA1C in the last 72 hours. Fasting Lipid Panel: No results for input(s): CHOL, HDL, LDLCALC, TRIG, CHOLHDL, LDLDIRECT in the last 72 hours. Thyroid Function Tests: No results for input(s): TSH, T4TOTAL, T3FREE, THYROIDAB in the last 72 hours.  Invalid input(s): FREET3 Anemia Panel: No results for input(s): VITAMINB12, FOLATE, FERRITIN, TIBC, IRON, RETICCTPCT in the last 72 hours.   PHYSICAL EXAM General: Well developed, Underweight, in no acute distress HEENT:  Normocephalic and atramatic Neck:  No JVD.  Lungs: Clear bilaterally to auscultation and percussion. Heart: HRRR . Normal S1 and S2 without gallops . There  is 4/6 holosystolic murmur at the apex Abdomen: Bowel sounds are positive, abdomen soft and non-tender  Msk:  Back normal, normal gait. Normal strength and tone for age. Extremities: No clubbing, cyanosis or edema.   Neuro: Alert and oriented X 3. Psych:  Good affect, responds appropriately    ASSESSMENT AND PLAN:  1. Recurrent Pleural effusion: - Likely due to mitral valve stenosis with pulmonary hypertension.  2. Mitral valve disease: - TEE was performed today. However, the images were unfortunately significant calcium artifact. Ejection fraction was 50%, mitral valve annulus was heavily calcified with severely thickened and calcified mitral valve leaflets and what seems to be a calcified mass likely an old vegetation. There is at least moderate to severe mitral stenosis with a mean gradient of 10 mmHg at rest. There was moderate eccentric regurgitation I did review the previous transthoracic echocardiogram from February 2017. The image quality was actually good with a mean gradient across the mitral valve of 13 mmHg with a mitral valve area of 0.5 cm. The IVC was severely dilated with an estimated systolic pulmonary pressure of 65 mm. There was severe biatrial enlargement.  Based on the above, I recommend mitral valve replacement. Given that she has been deemed to be a high risk for  anticoagulation, she probably should get a bioprosthetic  valve with clipping of the atrial appendage. I discussed this with the patient and the family. They want her to have this done at [facility] but the patient has not made the final decision yet about the timing.   3. Chronic combined CHF: -EF  is mildly reduced at 50%    4. ESRD: -On peritoneal Dialysis  5. PAF: -Isolated incident  -Not on long term anticoagulation  -Currently in sinus rhythm  6. History of stroke: -Still with some residual speech difficulties  -Continue current medications, aspirin 81 mg  7. COPD: -Stable  8. Anemia of  chronic disease: -Stable  9. HTN: -Continue current medications  10. Poor oral hygiene: This might need to be addressed before any mitral valve surgery.  Lorine Bears, MD, [facility] 03/07/2016 9:43 AM

## 2016-03-07 NOTE — Interval H&P Note (Signed)
History and Physical Interval Note:  03/07/2016 8:41 AM  Dorothy Long  has presented today for surgery, with the diagnosis of mitral valve disease.  The various methods of treatment have been discussed with the patient and family. After consideration of risks, benefits and other options for treatment, the patient has consented to  Procedure(s): TRANSESOPHAGEAL ECHOCARDIOGRAM (TEE) (N/A) as a surgical intervention .  The patient's history has been reviewed, patient examined, no change in status, stable for surgery.  I have reviewed the patient's chart and labs.  Questions were answered to the patient's satisfaction.     Lorine BearsMuhammad Arida

## 2016-03-07 NOTE — Progress Notes (Signed)
[facility]  ROUNDING NOTE   Subjective:  Patient had TEE earlier this a.m. Ejection fraction was found to be 50%. Mitral valve annulus was heavily calcified. Cardiology felt that she will need mitral valve replacement. She continues to progress well with peritoneal dialysis.    Objective:  Vital signs in last 24 hours:  Temp:  [97.8 F (36.6 C)-98.6 F (37 C)] 97.8 F (36.6 C) (03/24 1118) Pulse Rate:  [70-91] 72 (03/24 1046) Resp:  [10-29] 16 (03/24 1118) BP: (86-131)/(68-99) 116/83 mmHg (03/24 1118) SpO2:  [88 %-98 %] 98 % (03/24 1046) Weight:  [47.2 kg (104 lb 0.9 oz)-48.127 kg (106 lb 1.6 oz)] 47.2 kg (104 lb 0.9 oz) (03/24 0655)  Weight change: -1.724 kg (-3 lb 12.8 oz) Filed Weights   03/06/16 0500 03/07/16 0426 03/07/16 0655  Weight: 49.85 kg (109 lb 14.4 oz) 48.127 kg (106 lb 1.6 oz) 47.2 kg (104 lb 0.9 oz)    Intake/Output: I/O last 3 completed shifts: In: 240 [P.O.:240] Out: 2051 [Urine:250; Other:1801]   Intake/Output this shift:     Physical Exam: General: NAD, resting in bed  Head: Normocephalic, atraumatic. Moist oral mucosal membranes  Eyes: Anicteric  Neck: Supple, trachea midline  Lungs:  Diminished breath sounds at right base  Heart: S1S2 no rubs  Abdomen:  Soft, nontender, BS present, PD catheter present   Extremities: R > L  peripheral edema.  Neurologic: Mild right sided weakness  Skin: No lesions  Access: PD catheter in place    Basic Metabolic Panel:  Recent Labs Lab 03/04/16 1843 03/05/16 0413 03/06/16 0657 03/07/16 0431  NA 132* 131* 131* 135  K 3.7 3.7 3.7 3.9  CL 96* 100* 98* 99*  CO2 23 22 24 24   GLUCOSE 91 81 82 88  BUN 53* 54* 52* 49*  CREATININE 8.09* 8.39* 8.18* 8.22*  CALCIUM 8.2* 8.0* 7.8* 7.5*    Liver Function Tests:  Recent Labs Lab 03/06/16 0657  AST 20  ALT 16  ALKPHOS 83  BILITOT 0.5  PROT 5.3*  ALBUMIN 2.1*   No results for input(s): LIPASE, AMYLASE in the last 168 hours. No  results for input(s): AMMONIA in the last 168 hours.  CBC:  Recent Labs Lab 03/04/16 1843 03/05/16 0413 03/07/16 0431  WBC 5.5 4.4 4.7  HGB 9.3* 8.1* 7.9*  HCT 28.1* 24.9* 24.3*  MCV 84.3 84.1 83.5  PLT 177 147* 130*    Cardiac Enzymes:  Recent Labs Lab 03/04/16 1843 03/05/16 0035 03/05/16 0617 03/05/16 1235  TROPONINI 0.06* 0.05* 0.04* 0.05*    BNP: Invalid input(s): POCBNP  CBG: No results for input(s): GLUCAP in the last 168 hours.  Microbiology: Results for orders placed or performed during the hospital encounter of 03/04/16  Culture, body fluid-bottle     Status: None (Preliminary result)   Collection Time: 03/06/16  9:35 AM  Result Value Ref Range Status   Specimen Description PLEURAL  Final   Special Requests NONE  Final   Gram Stain NO ORGANISMS SEEN  Final   Culture PENDING  Incomplete   Report Status PENDING  Incomplete    Coagulation Studies: No results for input(s): LABPROT, INR in the last 72 hours.  Urinalysis: No results for input(s): COLORURINE, LABSPEC, PHURINE, GLUCOSEU, HGBUR, BILIRUBINUR, KETONESUR, PROTEINUR, UROBILINOGEN, NITRITE, LEUKOCYTESUR in the last 72 hours.  Invalid input(s): APPERANCEUR    Imaging: Dg Chest 1 View  03/06/2016  CLINICAL DATA:  Post right thoracentesis EXAM: CHEST 1 VIEW COMPARISON:  03/04/2016 FINDINGS:  Marked cardiomegaly with central vascular congestion. Resolution of the right effusion following thoracentesis. No pneumothorax. Minor bibasilar atelectasis. Heavy calcification noted of the mitral valve. Trachea is midline. IMPRESSION: Negative for pneumothorax following thoracentesis. Cardiomegaly without CHF Extensive mitral valve calcifications. Electronically Signed   By: Judie Petit.  Shick M.D.   On: 03/06/2016 11:13   US Venous Img Lower Unilateral Left  03/06/2016  CLINICAL DATA:  Shortness of breath.  History of DVT. EXAM: LEFT LOWER EXTREMITY VENOUS DOPPLER ULTRASOUND TECHNIQUE: Gray-scale sonography with graded  compression, as well as color Doppler and duplex ultrasound were performed to evaluate the lower extremity deep venous systems from the level of the common femoral vein and including the common femoral, femoral, profunda femoral, popliteal and calf veins including the posterior tibial, peroneal and gastrocnemius veins when visible. The superficial great saphenous vein was also interrogated. Spectral Doppler was utilized to evaluate flow at rest and with distal augmentation maneuvers in the common femoral, femoral and popliteal veins. COMPARISON:  None. FINDINGS: Contralateral Common Femoral Vein: Respiratory phasicity is normal and symmetric with the symptomatic side. No evidence of thrombus. Normal compressibility. Common Femoral Vein: No evidence of thrombus. Normal compressibility, respiratory phasicity and response to augmentation. Saphenofemoral Junction: No evidence of thrombus. Normal compressibility and flow on color Doppler imaging. Profunda Femoral Vein: No evidence of thrombus. Normal compressibility and flow on color Doppler imaging. Femoral Vein: No evidence of thrombus. Normal compressibility, respiratory phasicity and response to augmentation. Popliteal Vein: No evidence of thrombus. Normal compressibility, respiratory phasicity and response to augmentation. Calf Veins: No evidence of thrombus. Normal compressibility and flow on color Doppler imaging. Superficial Great Saphenous Vein: No evidence of thrombus. Normal compressibility and flow on color Doppler imaging. Other Findings:  None. IMPRESSION: No evidence of deep venous thrombosis. Electronically Signed   By: Maisie Fus  Register   On: 03/06/2016 11:20   US Thoracentesis Asp Pleural Space W/img Guide  03/06/2016  INDICATION: Shortness of breath, large right pleural effusion by CT EXAM: ULTRASOUND GUIDED RIGHT THORACENTESIS MEDICATIONS: 1% lidocaine locally COMPLICATIONS: None immediate. PROCEDURE: An ultrasound guided thoracentesis was  thoroughly discussed with the patient and questions answered. The benefits, risks, alternatives and complications were also discussed. The patient understands and wishes to proceed with the procedure. Written consent was obtained. Ultrasound was performed to localize and mark an adequate pocket of fluid in the right chest. The area was then prepped and draped in the normal sterile fashion. 1% Lidocaine was used for local anesthesia. Under ultrasound guidance a 6 Fr Safe-T-Centesis catheter was introduced. Thoracentesis was performed. The catheter was removed and a dressing applied. FINDINGS: A total of approximately 1.3 L of clear pleural fluid was removed. Samples were sent to the laboratory as requested by the clinical team. IMPRESSION: Successful ultrasound guided right thoracentesis yielding 1.3 L of pleural fluid. Electronically Signed   By: Judie Petit.  Shick M.D.   On: 03/06/2016 09:53     Medications:     . aspirin  81 mg Oral Daily  . atorvastatin  80 mg Oral Daily  . calcitRIOL  0.5 mcg Oral Daily  . calcium acetate  2,001 mg Oral TID WC  . cinacalcet  30 mg Oral Daily  . dialysis solution 1.5% low-MG/low-CA   Intraperitoneal Q24H  . fentaNYL      . gentamicin cream  1 application Topical Daily  . heparin  5,000 Units Subcutaneous 3 times per day  . lidocaine      . midazolam      .  pantoprazole  40 mg Oral BID  . [START ON 03/08/2016] senna-docusate  1 tablet Oral BID  . sodium chloride flush  3 mL Intravenous Q12H  . tiotropium  18 mcg Inhalation Daily   acetaminophen **OR** acetaminophen, morphine injection, ondansetron **OR** ondansetron (ZOFRAN) IV, oxyCODONE-acetaminophen  Assessment/ Plan:  55 y.o. female with history of end-stage renal disease (FSGS) currently on PD, paroxysmal atrial fibrillation, COPD, cirrhosis, stroke with right-sided weakness 2017,severe mitral stenosis,depression, thrombocytopenia, and negative anticardiolipin antibody, weakly positive ANA, acute GI bleed in  January 2017 caused by severe esophagitis and bleeding rectal ulcer, IVC filter, pulmonary arterial hypertensionm, right-sided heart dysfunction consistent with cor pulmonale, significant venous engorgement of the suprahepatic IVC and hepatic veins suspicious for congestive hepatopathy and cardiac cirrhosis.  1.  End-stage renal disease on peritoneal dialysis.  Patient has been on peritoneal dialysis with 6 exchanges, 1600 cc fill volume, no last fill and use of 1.5% dextrose solution.  Continue the current regimen.  2.  Right pleural effusion. Likely related to high right-sided pressures and pulmonary hypertension given her mitral valve disease.  She will need valve replacement per cardiology.  3.  Secondary hyperparathyroidism.  Continue PhosLo, calcitriol, and Sensipar.  4.  Anemia chronic kidney disease.  Hemoglobin currently 7.9.  We are avoiding erythropoietin stimulating agent therapy given recent CVA.  This was discussed in depth with the patient.  5.  Mitral valve disease.  Cardiology has recommended mitral valve replacement.  Patient would like to have this done at [facility].    LOS:  [place] Grayer 3/24/20174:31 PM

## 2016-03-07 NOTE — Progress Notes (Signed)
Versed 1 mg IV and Fentanyl 50 mcg IV given

## 2016-03-07 NOTE — H&P (View-Only) (Signed)
 Cardiology Consultation Note  Patient ID: Dorothy Long, MRN: [id number], DOB/AGE: 09/06/1961 55 y.o. Admit date: 03/04/2016   Date of Consult: 03/06/2016 Primary Physician: TULLO, TERESA L, MD Primary Cardiologist: Dr. Gollan, MD  Chief Complaint: Chest and leg pain x 1 week Reason for Consult: Vascular disease/mitral valve disease  HPI: 55 y.o. 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 She presented again to [facility] on 12/27 with AMS.   Her first and only occurence of Afib occured while in the hospital on 09/21/2013 in the setting of acute diarrhea illness. CHADVASc does calulate out to be at least 4 (CHF, HTN, DM, female). Given this was a one-time event she has not been placed on long term, full dose anticoagulation. At that time in hospital echo showed EF 45-50%, diastolic dysfunction, biatrial enalargement, mild aortic regurgitation. Mitral leaflets were thickened and appeared to be rheumatically deformed. There was heavy mitral annular calcification. Mild mitral stenosis with a calcificed valve area of 1.6 cm^2 by pressure half-time equation and moderate, eccentric mitral regurgitation. Mild to moderate pulmonary HTN at 35 mm Hg. She underwent pharmacological stress test during that admission that showed no convincing evidence of pharmacologically induced myocardial  ischemia. Apparent anterior apical defect that was present only on attenuation corrected images was favored to reflect artifact due to overcorrection and may have been worse on stress imaging secondary to greater patient motion. A small area of ischemia was felt less likely but was difficult to entirely exclude. EF 55%.   Echo performed 05/04/2014 to evaluate her mitral valve showed EF 45-50%, borderline concentric LVH, moderately dilated left atrium, mildly dilated right atrium, small pericardial effusion, moderate mitral regurgitation. There was rheumatic deformity. Moderate thickeing and calficication of the anterior and posterior mitral valves. No gradient was recorded. The mitral stenosis appeared mild to moderate visually. Mild aortic sclerosis without stenosis. Mildly elevated PASP. Mild to moderate tricuspid regurgitation.   She has previously undergone renal biopsy that demonstrated focal segmental glomerulosclerosis at [facility] in the setting of her SCr going from 1.8 to 5 to 6. Ultrasound showed atrophic right kidney. Now ESRD on HD with concomitant PD. She has started classes for kidney transplant through [facility] and has started the transplant process at [facility].   She was admitted to [facility] from 6/30 to 7/2 for abdominal/chest pain. CT abdomen pelvis on 7/1 at that time showed small left inguinal hernia. Scan on 6/30 showed enlarged liver and possible cirrhosis, advised to follow up with GI. Troponin was mildly elevated and flat trending at 0.10-->0.11 x 2 in the setting of her ESRD. Lipase was elevated at 79 on 6/30. WBC unremarkable. Case was dicussed with vascular who felt her PD catheter was ok and she was discharged home. It was felt her elevated troponin was in the setting of her ESRD. In hospital follow up patient was having considerable N/V/D with associated early satiety.   She was admitted to [facility]   for a second time from 7/8 to 7/10 for abdominal pain secondary left inguinal hernia and nausea.  Initially suspected as spontaneous bacterial peritonitis and treated with IV Rocephin. Fluid cultures were negative. She responded well to Zofran.   She presented to the ED on 7/18, after last HD session being performed on 7/16, with complaints of lower extremity edema, SOB with exertion, and orthopnea. CXR consistent with fluid overload/mild CHF. Venous HTN and small effusions. Weight 119 pound (53.9 kg) goal dry weight 54.5 kg. Case was discussed with nephrology and she was secured an outpatient dialysis session for later in the day.   She followed up with Dr. Byrnett, MD on 7/21 for evaluation of her left inguinal hernia and ventral hernia. At that time she had continued SOB and abdominal pain. She underwent successful repair of the left indirect inguinal and ventral hernias on 07/13/2015. She was admitted to [facility] in late October for colitis.   She was admitted to [facility] on 12/3 with presyncope after cleaning her kitchen prior to leaving for church and suddenly felt very weak along the left side with associated slurred speech. CT head with no acute findings. MRI without acute stroke. Carotid doppler without significant carotid stenosis bilaterally. Echo showed EF 40-45%, diffuse HK, GR2DD, high ventricular filling pressures, calcified aortic annulus, mild AI, severely restricted mitral valve c/w moderate stenosis though by visual appearance the stenosis appeared to be severe, severely dilated LA, severely dilated RA, mild TR and PR, inferior vena cava was dilated. Her syncopal episode was of unclear etiology. It was recommended she undergo TEE in the outpatient setting (as this is generally not associated with syncope) and a 30 day event monitor. She was admitted again in late December 2016 with acute CVA after developing syncope and slurred speech. MRI brain showed minimal flow into the left ICA. Angiogram was done which showed minimal atherosclerotic lesion at the bifurcation of carotids bilaterally and  normal left side long segment. No intervention.   She was admitted again in February 2017 for upper GI bleed. She was seen by GI and treated supportively s/p transfusion. Echo that admission showed EF 55-60%, mild AI, calcified mitral annulus, unable to exclude vegetation, moderate mitral regurgitation, left atrium was moderately dilated, right atrium was mildly to moderately dilated, PASP 49 mm Hg. This echo was read by outside group and she was consulted on by outside group.  She presented to [facility] on 3/21 with a one week history of chest pain and LE swelling coupled with SOB. She denies any weight gain, orthopnea, PND, or early satiety. She has not missed any dialysis sessions. Upon her arrival she was found to have cardiac enlargement with central pulmonary vascular congestion and increasing right pleural effusion with basilar infiltration. Lower extremity ultrasound was negative for DVT. She underwent ultrasound guided thoracentesis on 3/23 with aspiration of 1.3 L of clear pleural fluid. Troponin has ben mildly elevated at 0.05 to 0.04 in the setting of her ESRD on dialysis.    Past Medical History  Diagnosis Date  . Hypertension   . Hyperlipidemia   . COPD (chronic obstructive pulmonary disease) (HCC)     a. not on home O2  . Anemia   . Valvular disease     a. 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 echo 2015: EF 45-50%, borderline   LVH, mod dilated LA, mildly dilated RA, small pericardial effusion, mod MR (rheumatic deformity), MS mild-mod, no gradient, PASP ele  . Chronic combined systolic and diastolic CHF (congestive heart failure) (HCC)     a. echo reports above  . PAF (paroxysmal atrial fibrillation) (HCC)     a. in setting of diarrhea 09/21/2013; b. not on long term full dose anticoagulation; c. CHADSVASC at  least 4 (CHF, HTN, DM, female)  . Diabetes mellitus (HCC)   . Peritoneal dialysis status (HCC) 4- 15-15  . Ventral hernia   . Inguinal hernia   . History of stress test     a. 2014: no convincing evidence of pharmacologically induced ischemia,  apparent ant apical defect present only on attenuation corrected images favored to reflect artifact due to overcorrection & may be worse on stress imaging secondary to greater patient motion. A small area of ischemia is felt less likely but is difficult to entirely exclude, EF 55%  . ESRD (end stage renal disease) on dialysis (HCC)     a. HD on T,T,S; b. 2/2 focal segmental glomerulosclerosis   . Dialysis patient (HCC)   . Renal insufficiency   . Stroke (HCC) 11/2015    a. MRI L cereblal infarctions in the MCA/PCA, ACA/MCA territory; b. felt to be carotid in etiology; c. medically managed on DAPT per neuro      Most Recent Cardiac Studies: As above   Surgical History:  Past Surgical History  Procedure Laterality Date  . Abdominal hysterectomy    . Cholecystectomy    . Knee surgery Right   . Foot surgery Bilateral   . Av fistula placement Left 10-26-13    Dr Schnier  . Peritoneal catheter insertion  03-29-14    Dr Schnier  . Inguinal hernia repair Left 07/13/2015    Procedure: HERNIA REPAIR INGUINAL ADULT;  Surgeon: Jeffrey W Byrnett, MD;  Location: [facility] ORS;  Service: General;  Laterality: Left;  . Ventral hernia repair N/A 07/13/2015    Procedure: HERNIA REPAIR VENTRAL ADULT;  Surgeon: Jeffrey W Byrnett, MD;  Location: [facility] ORS;  Service: General;  Laterality: N/A;     Home Meds: Prior to Admission medications   Medication Sig Start Date End Date Taking? Authorizing Provider  acetaminophen (TYLENOL) 325 MG tablet Take 650 mg by mouth every 4 (four) hours as needed for mild pain or headache.    Yes Historical Provider, MD  albuterol (PROVENTIL HFA;VENTOLIN HFA) 108 (90 Base) MCG/ACT inhaler Inhale 2 puffs into the lungs every 6 (six) hours  as needed for wheezing or shortness of breath.   Yes Historical Provider, MD  aspirin 81 MG chewable tablet Chew 81 mg by mouth daily.   Yes Historical Provider, MD  atorvastatin (LIPITOR) 80 MG tablet Take 80 mg by mouth daily.    Yes Historical Provider, MD  calcitRIOL (ROCALTROL) 0.5 MCG capsule Take 0.5 mcg by mouth daily.    Yes Historical Provider, MD  calcium acetate (PHOSLO) 667 MG capsule Take 2,001 mg by mouth 3 (three) times daily with meals.   Yes Historical Provider, MD  cinacalcet (SENSIPAR) 30 MG tablet Take 30 mg by mouth daily.   Yes Historical Provider, MD  docusate sodium (COLACE) 100 MG capsule Take 100 mg by mouth daily as needed for mild constipation.    Yes Historical Provider, MD  ondansetron (ZOFRAN-ODT) 4 MG disintegrating tablet Take 4 mg by mouth every 8 (eight) hours as needed for nausea or vomiting.    Yes Historical Provider,   MD  pantoprazole (PROTONIX) 40 MG tablet Take 40 mg by mouth 2 (two) times daily.    Yes Historical Provider, MD  sodium chloride 1 g tablet Take 2 g by mouth 2 (two) times daily with a meal.   Yes Historical Provider, MD  sucralfate (CARAFATE) 1 GM/10ML suspension Take 10 mLs (1 g total) by mouth every 6 (six) hours. 02/12/16  Yes Vivek J Sainani, MD  tiotropium (SPIRIVA) 18 MCG inhalation capsule Place 1 capsule (18 mcg total) into inhaler and inhale daily. 08/22/15  Yes Carrie M Doss, NP    Inpatient Medications:  . aspirin  81 mg Oral Daily  . atorvastatin  80 mg Oral Daily  . calcitRIOL  0.5 mcg Oral Daily  . calcium acetate  2,001 mg Oral TID WC  . cinacalcet  30 mg Oral Daily  . dialysis solution 1.5% low-MG/low-CA   Intraperitoneal Q24H  . gentamicin cream  1 application Topical Daily  . heparin  5,000 Units Subcutaneous 3 times per day  . pantoprazole  40 mg Oral BID  . sodium chloride flush  3 mL Intravenous Q12H  . tiotropium  18 mcg Inhalation Daily      Allergies:  Allergies  Allergen Reactions  . Buprenorphine Nausea And  Vomiting  . Ciprocinonide [Fluocinolone] Hives  . Demerol [Meperidine] Nausea And Vomiting  . Diflucan [Fluconazole] Nausea And Vomiting  . Flagyl [Metronidazole] Hives and Itching  . Hydrocodone Itching  . Levaquin [Levofloxacin] Other (See Comments)    Reaction: Numbness in legs   . Morphine And Related Nausea And Vomiting  . Tetracyclines & Related Itching and Swelling  . Valium [Diazepam] Nausea And Vomiting and Other (See Comments)    Reaction:  Hallucinations     Social History   Social History  . Marital Status: Married    Spouse Name: N/A  . Number of Children: N/A  . Years of Education: N/A   Occupational History  . disabled    Social History Main Topics  . Smoking status: Former Smoker -- 0.00 packs/day for 0 years  . Smokeless tobacco: Never Used  . Alcohol Use: No     Comment: occasional  . Drug Use: No  . Sexual Activity: No   Other Topics Concern  . Not on file   Social History Narrative     Family History  Problem Relation Age of Onset  . Cancer Mother     colon  . Heart disease Father   . Hypertension Father      Review of Systems: Review of Systems  Constitutional: Positive for malaise/fatigue. Negative for fever, chills, weight loss and diaphoresis.  HENT: Negative for congestion.   Eyes: Negative for discharge and redness.  Respiratory: Positive for cough and shortness of breath. Negative for hemoptysis, sputum production and wheezing.   Cardiovascular: Positive for chest pain. Negative for palpitations, orthopnea, claudication, leg swelling and PND.  Gastrointestinal: Negative for vomiting and abdominal pain.  Musculoskeletal: Negative for myalgias and falls.  Skin: Negative for rash.  Neurological: Positive for dizziness and weakness. Negative for tingling, tremors, sensory change, speech change, focal weakness and loss of consciousness.  Endo/Heme/Allergies: Does not bruise/bleed easily.  Psychiatric/Behavioral: The patient is not  nervous/anxious.   All other systems reviewed and are negative.   Labs:  Recent Labs  03/04/16 1843 03/05/16 0035 03/05/16 0617 03/05/16 1235  TROPONINI 0.06* 0.05* 0.04* 0.05*   Lab Results  Component Value Date   WBC 4.4 03/05/2016   HGB 8.1* 03/05/2016     HCT 24.9* 03/05/2016   MCV 84.1 03/05/2016   PLT 147* 03/05/2016    Recent Labs Lab 03/06/16 0657  NA 131*  K 3.7  CL 98*  CO2 24  BUN 52*  CREATININE 8.18*  CALCIUM 7.8*  PROT 5.3*  BILITOT 0.5  ALKPHOS 83  ALT 16  AST 20  GLUCOSE 82   Lab Results  Component Value Date   CHOL 137 12/11/2015   HDL 42 12/11/2015   LDLCALC 81 12/11/2015   TRIG 71 12/11/2015   No results found for: DDIMER  Radiology/Studies:  Dg Chest 1 View  03/06/2016  CLINICAL DATA:  Post right thoracentesis EXAM: CHEST 1 VIEW COMPARISON:  03/04/2016 FINDINGS: Marked cardiomegaly with central vascular congestion. Resolution of the right effusion following thoracentesis. No pneumothorax. Minor bibasilar atelectasis. Heavy calcification noted of the mitral valve. Trachea is midline. IMPRESSION: Negative for pneumothorax following thoracentesis. Cardiomegaly without CHF Extensive mitral valve calcifications. Electronically Signed   By: M.  Shick M.D.   On: 03/06/2016 11:13   Dg Chest 2 View  03/04/2016  CLINICAL DATA:  Right lower leg pain and swelling for 5 days. Right-sided chest pain starting yesterday. EXAM: CHEST  2 VIEW COMPARISON:  12/18/2015 FINDINGS: Cardiac enlargement with mild central pulmonary vascular congestion suggesting arterial hypertension. Calcification projected over the heart corresponding calcification in the mitral valve annulus on prior CT. Mediastinal contours appear intact. There is increasing right pleural effusion with basilar atelectasis or consolidation in the right lung since the previous study. No pneumothorax. Vena caval filter and surgical clips demonstrated in the right upper quadrant. IMPRESSION: Cardiac  enlargement with central pulmonary vascular congestion. Increasing right pleural effusion with basilar infiltration or atelectasis. Electronically Signed   By: William  Stevens M.D.   On: 03/04/2016 19:40   Ct Chest Wo Contrast  03/05/2016  CLINICAL DATA:  End-stage renal disease with right pleural effusion and chronic congestive heart failure. EXAM: CT CHEST WITHOUT CONTRAST TECHNIQUE: Multidetector CT imaging of the chest was performed following the standard protocol without IV contrast. COMPARISON:  Chest CT February 08, 2016; chest radiograph March 04, 2016 FINDINGS: Mediastinum/Lymph Nodes: Thyroid appears unremarkable. There are subcentimeter mediastinal lymph nodes but no adenopathy by size criteria. There is no appreciable thoracic aortic aneurysm. Visualized great vessels appear unremarkable. There is generalized cardiomegaly with primarily right heart enlargement. There is a small pericardial effusion. There are multiple foci of coronary artery calcification. There is extensive mitral valve calcification. There is prominence of the central pulmonary arteries with rapid peripheral tapering. Lungs/Pleura: There remains a sizable right pleural effusion with atelectatic change in the right lower lobe. There is scarring in the lung bases. There is a rather minimal left pleural effusion with mild left base atelectasis. The somewhat nodular appearing opacity on axial images in the medial left base on prior study appears consistent with localized atelectasis at this time. Upper abdomen: In the visualized upper abdomen, kidneys are atrophic. Liver has a somewhat nodular contour, concerning for a degree of underlying cirrhosis. There is a filter in the inferior vena cava. There is calcification in the visualized abdominal aorta. Gallbladder is absent. Musculoskeletal: Bones are somewhat sclerotic consistent with chronic renal failure. No lytic bone lesions are evident. IMPRESSION: There is a persistent sizable  right pleural effusion with right base atelectasis. There is a minimal left pleural effusion with patchy scarring and atelectasis in the left base region. Small pericardial effusion. Changes indicative of a degree of cor pulmonale, stable. Extensive mitral valve and annulus calcification.   There are multiple foci of coronary artery calcification. No adenopathy. Bony changes of chronic renal failure. Filter in the inferior vena cava. Kidneys atrophic. Liver has a somewhat nodular contour, concerning for a degree of underlying hepatic cirrhosis. Electronically Signed   By: William  Woodruff III M.D.   On: 03/05/2016 15:47   Ct Angio Chest Pe W/cm &/or Wo Cm  02/08/2016  CLINICAL DATA:  54-year-old female with a history of possible systemic venous obstruction EXAM: CT ANGIOGRAPHY AND VENOGRAPHY CHEST, ABDOMEN AND PELVIS TECHNIQUE: Multidetector CT imaging through the chest, abdomen and pelvis was performed using the standard protocol during bolus administration of intravenous contrast. Multiplanar reconstructed images and MIPs were obtained and reviewed to evaluate the vascular anatomy. CONTRAST:  100 mL Omnipaque 350 COMPARISON:  Prior CT of abdomen and pelvis 06/22/2015; prior CT scan of the chest 02/09/2015 FINDINGS: CTA CHEST FINDINGS Mediastinum: Unremarkable CT appearance of the thyroid gland. No suspicious mediastinal or hilar adenopathy. No soft tissue mediastinal mass. The thoracic esophagus is unremarkable. Heart/Vascular: Pulmonary arterial phase imaging demonstrates excellent opacification of the pulmonary arteries to the proximal subsegmental level. There is no evidence of pulmonary embolus. The main pulmonary artery is enlarged measuring up to 3.6 cm in diameter. This is larger than the adjacent aorta. The heart is also markedly enlarged, particularly the right heart consistent with cor pulmonale. Case ease calcification is present along the mitral valve annulus. Atherosclerotic calcifications present  along the left anterior descending, circumflex and right coronary arteries. No pericardial effusion. Lungs/Pleura: Large layering right pleural effusion with associated right lower lobe atelectasis. No definite pleural enhancement or nodularity. Perhaps mild interlobular septal thickening and ground-glass attenuation airspace opacity in the lungs consistent with mild interstitial edema. Diffuse mild bronchial wall thickening. On the axial images, there is a somewhat nodular opacity in the medial aspect of the left lower lobe measuring 10 x 8 mm. However, on the coronal and sagittal reformatted images this opacity appears more linear and is therefore strongly favored to represent a region of atelectasis. Bones/Soft Tissues: No acute fracture or aggressive appearing lytic or blastic osseous lesion. Review of the MIP images confirms the above findings. CTA ABDOMEN AND PELVIS FINDINGS VASCULAR Aorta: Normal caliber abdominal aorta without evidence of aneurysm. Scattered atherosclerotic plaque is present throughout. No significant stenosis or evidence of dissection. Celiac: High-grade stenosis versus occlusion of the proximal aspect of the celiac artery secondary to bulky and calcified atherosclerotic plaque. The distal branches reconstitute, possibly through collateralization via the pancreaticoduodenal arcade. SMA: Superior mesenteric artery is widely patent. Prominent pancreaticoduodenal arcade. Renals: The native kidneys are atretic bilaterally. The renal arteries are miniscule and not well seen. IMA: Atretic superior mesenteric artery.  No definite stenosis. Inflow: Calcified atherosclerotic plaque results in focal high-grade stenosis versus short segment occlusion in the left common iliac artery. There is a focal irregular calcified plaque extending into the proximal right common iliac artery resulting in an approximately 50% stenosis. High-grade stenosis of the origin of the left internal iliac artery. The right  internal iliac artery is patent. The external iliac arteries are relatively disease free. Proximal Outflow: Diseased bilateral superficial femoral arteries. At least moderate stenosis in the proximal left SFA and mild stenosis in the proximal right SFA. The vessels remain patent into the upper thighs. Veins: Dedicated venous phase imaging demonstrates engorged ment of the suprahepatic IVC. A potentially retrievable IVC filter is present in the infrarenal IVC. This appears to be a Bard Denali filter. No evidence of leg penetration, fracture   or other complication. No thrombus. The IVC remains patent throughout its course. The iliac and visualized femoral veins are also patent. NON-VASCULAR Abdomen: Abnormal appearance of the liver with enlargement of the left lobe and relative atrophy of the right lobe. Additionally, there is a macronodular contour of the liver surface. Findings raise concern for cirrhotic change versus severe congestive hit the top of the related to chronic right heart failure. No discrete lesion identified. The gallbladder is surgically absent. No intra or extrahepatic biliary ductal dilatation. Unremarkable CT appearance of the stomach, duodenum, spleen, adrenal glands and pancreas. The native kidneys are atrophic. No evidence of obstruction or focal bowel wall thickening. Normal appendix in the right lower quadrant. The terminal ileum is unremarkable. A peritoneal dialysis catheter is coiled in the pelvic recess. There is small volume ascites which is not unexpected. Pelvis: Surgical changes of prior hysterectomy. Mild pelvic floor laxity. Free ascites related to peritoneal dialysis. Bones/Soft Tissues: No acute fracture or aggressive appearing lytic or blastic osseous lesion. The bones appear mildly mottled diffusely. This appearance is favored to represent demineralization/osteopenia. Renal osteodystrophy is also a possibility. Review of the MIP images confirms the above findings. IMPRESSION:  VASCULAR 1. Overall vascular findings are most suggestive of pulmonary arterial hypertension and right-sided heart dysfunction consistent with cor pulmonale. 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 2. No evidence of acute pulmonary embolus, aneurysm, or dissection. 3. Critical stenosis versus short focal occlusion of the origin of the celiac artery. The distal branch vessels remain patent due in part to collateralization through the pancreaticoduodenal arcade. 4. Atrophic renal arteries bilaterally with associated atrophy of the native kidneys. 5. Peripheral arterial disease with high-grade stenosis versus short segment occlusion of the left common iliac artery, moderate to high-grade stenosis of the proximal right common iliac artery, high-grade stenosis of the origin of the left internal iliac artery, and mild to moderate superficial femoral artery disease (worse on the left than the right) in the visualized segments of the upper thighs. 6. No evidence of venous thrombosis or obstruction. 7. Well-positioned infrarenal potentially retrievable IVC filter. NON VASCULAR 1. Cardiomegaly with right heart enlargement. 2. Mild interstitial pulmonary edema suggests congestive heart failure. 3. Large layering right pleural effusion with associated right lower lobe atelectasis. 4. Dependent atelectasis in the left lower lobe. 5. 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 Other etiologies for cirrhosis including but not limited to chronic hepatitis, alcoholism are not excluded radiographically. 6. Peritoneal dialysis catheter coiled in the pelvis with associated ascites/dialysate. 7. Mild pelvic floor laxity. 8. Surgical changes of prior hysterectomy and cholecystectomy. 9. Diffusely mottled appearance of the bones favored to represent either demineralization/osteopenia or renal osteodystrophy. 10.  Atrophic native kidneys. Signed, Heath K. McCullough, MD [facility] Electronically Signed   By: Heath  McCullough M.D.   On: 02/08/2016 10:25   Us Venous Img Lower Unilateral Right  03/04/2016  CLINICAL DATA:  Pain and swelling in the right leg for 2 weeks. Previous history of deep venous thrombosis. Aspirin anticoagulation therapy. EXAM: Right LOWER EXTREMITY VENOUS DOPPLER ULTRASOUND TECHNIQUE: Gray-scale sonography with graded compression, as well as color Doppler and duplex ultrasound were performed to evaluate the lower extremity deep venous systems from the level of the common femoral vein and including the common femoral, femoral, profunda femoral, popliteal and calf veins including the posterior tibial, peroneal and gastrocnemius veins when visible. The superficial great   saphenous vein was also interrogated. Spectral Doppler was utilized to evaluate flow at rest and with distal augmentation maneuvers in the common femoral, femoral and popliteal veins. COMPARISON:  None. FINDINGS: Contralateral Common Femoral Vein: Respiratory phasicity is normal and symmetric with the symptomatic side. No evidence of thrombus. Normal compressibility. Common Femoral Vein: No evidence of thrombus. Normal compressibility, respiratory phasicity and response to augmentation. Saphenofemoral Junction: No evidence of thrombus. Normal compressibility and flow on color Doppler imaging. Profunda Femoral Vein: No evidence of thrombus. Normal compressibility and flow on color Doppler imaging. Femoral Vein: No evidence of thrombus. Normal compressibility, respiratory phasicity and response to augmentation. Popliteal Vein: No evidence of thrombus. Normal compressibility, respiratory phasicity and response to augmentation. Calf Veins: No evidence of thrombus. Normal compressibility and flow on color Doppler imaging. Superficial Great Saphenous Vein: No evidence of thrombus.  Normal compressibility and flow on color Doppler imaging. Venous Reflux:  None. Other Findings:  None. IMPRESSION: No evidence of deep venous thrombosis. Electronically Signed   By: William  Stevens M.D.   On: 03/04/2016 19:31   Us Venous Img Lower Unilateral Right  02/07/2016  CLINICAL DATA:  Swelling. EXAM: RIGHT LOWER EXTREMITY VENOUS DOPPLER ULTRASOUND TECHNIQUE: Gray-scale sonography with graded compression, as well as color Doppler and duplex ultrasound were performed to evaluate the lower extremity deep venous systems from the level of the common femoral vein and including the common femoral, femoral, profunda femoral, popliteal and calf veins including the posterior tibial, peroneal and gastrocnemius veins when visible. The superficial great saphenous vein was also interrogated. Spectral Doppler was utilized to evaluate flow at rest and with distal augmentation maneuvers in the common femoral, femoral and popliteal veins. COMPARISON:  No prior. FINDINGS: Contralateral Common Femoral Vein: Respiratory phasicity is normal and symmetric with the symptomatic side. No evidence of thrombus. Normal compressibility. Common Femoral Vein: No evidence of thrombus. Normal compressibility, respiratory phasicity and response to augmentation. Saphenofemoral Junction: No evidence of thrombus. Normal compressibility and flow on color Doppler imaging. Profunda Femoral Vein: No evidence of thrombus. Normal compressibility and flow on color Doppler imaging. Femoral Vein: No evidence of thrombus. Normal compressibility, respiratory phasicity and response to augmentation. Popliteal Vein: No evidence of thrombus. Normal compressibility, respiratory phasicity and response to augmentation. Calf Veins: No evidence of thrombus. Normal compressibility and flow on color Doppler imaging. Superficial Great Saphenous Vein: No evidence of thrombus. Normal compressibility and flow on color Doppler imaging. Venous Reflux:  None. Other  Findings:  None. IMPRESSION: No evidence of deep venous thrombosis. Electronically Signed   By: Thomas  Register   On: 02/07/2016 12:50   Ct Angio Abd/pel W/ And/or W/o  02/08/2016  CLINICAL DATA:  54-year-old female with a history of possible systemic venous obstruction EXAM: CT ANGIOGRAPHY AND VENOGRAPHY CHEST, ABDOMEN AND PELVIS TECHNIQUE: Multidetector CT imaging through the chest, abdomen and pelvis was performed using the standard protocol during bolus administration of intravenous contrast. Multiplanar reconstructed images and MIPs were obtained and reviewed to evaluate the vascular anatomy. CONTRAST:  100 mL Omnipaque 350 COMPARISON:  Prior CT of abdomen and pelvis 06/22/2015; prior CT scan of the chest 02/09/2015 FINDINGS: CTA CHEST FINDINGS Mediastinum: Unremarkable CT appearance of the thyroid gland. No suspicious mediastinal or hilar adenopathy. No soft tissue mediastinal mass. The thoracic esophagus is unremarkable. Heart/Vascular: Pulmonary arterial phase imaging demonstrates excellent opacification of the pulmonary arteries to the proximal subsegmental level. There is no evidence of pulmonary embolus. The main pulmonary artery is enlarged measuring up to 3.6 cm in   diameter. This is larger than the adjacent aorta. The heart is also markedly enlarged, particularly the right heart consistent with cor pulmonale. Case ease calcification is present along the mitral valve annulus. Atherosclerotic calcifications present along the left anterior descending, circumflex and right coronary arteries. No pericardial effusion. Lungs/Pleura: Large layering right pleural effusion with associated right lower lobe atelectasis. No definite pleural enhancement or nodularity. Perhaps mild interlobular septal thickening and ground-glass attenuation airspace opacity in the lungs consistent with mild interstitial edema. Diffuse mild bronchial wall thickening. On the axial images, there is a somewhat nodular opacity in the  medial aspect of the left lower lobe measuring 10 x 8 mm. However, on the coronal and sagittal reformatted images this opacity appears more linear and is therefore strongly favored to represent a region of atelectasis. Bones/Soft Tissues: No acute fracture or aggressive appearing lytic or blastic osseous lesion. Review of the MIP images confirms the above findings. CTA ABDOMEN AND PELVIS FINDINGS VASCULAR Aorta: Normal caliber abdominal aorta without evidence of aneurysm. Scattered atherosclerotic plaque is present throughout. No significant stenosis or evidence of dissection. Celiac: High-grade stenosis versus occlusion of the proximal aspect of the celiac artery secondary to bulky and calcified atherosclerotic plaque. The distal branches reconstitute, possibly through collateralization via the pancreaticoduodenal arcade. SMA: Superior mesenteric artery is widely patent. Prominent pancreaticoduodenal arcade. Renals: The native kidneys are atretic bilaterally. The renal arteries are miniscule and not well seen. IMA: Atretic superior mesenteric artery.  No definite stenosis. Inflow: Calcified atherosclerotic plaque results in focal high-grade stenosis versus short segment occlusion in the left common iliac artery. There is a focal irregular calcified plaque extending into the proximal right common iliac artery resulting in an approximately 50% stenosis. High-grade stenosis of the origin of the left internal iliac artery. The right internal iliac artery is patent. The external iliac arteries are relatively disease free. Proximal Outflow: Diseased bilateral superficial femoral arteries. At least moderate stenosis in the proximal left SFA and mild stenosis in the proximal right SFA. The vessels remain patent into the upper thighs. Veins: Dedicated venous phase imaging demonstrates engorged ment of the suprahepatic IVC. A potentially retrievable IVC filter is present in the infrarenal IVC. This appears to be a Bard  Denali filter. No evidence of leg penetration, fracture or other complication. No thrombus. The IVC remains patent throughout its course. The iliac and visualized femoral veins are also patent. NON-VASCULAR Abdomen: Abnormal appearance of the liver with enlargement of the left lobe and relative atrophy of the right lobe. Additionally, there is a macronodular contour of the liver surface. Findings raise concern for cirrhotic change versus severe congestive hit the top of the related to chronic right heart failure. No discrete lesion identified. The gallbladder is surgically absent. No intra or extrahepatic biliary ductal dilatation. Unremarkable CT appearance of the stomach, duodenum, spleen, adrenal glands and pancreas. The native kidneys are atrophic. No evidence of obstruction or focal bowel wall thickening. Normal appendix in the right lower quadrant. The terminal ileum is unremarkable. A peritoneal dialysis catheter is coiled in the pelvic recess. There is small volume ascites which is not unexpected. Pelvis: Surgical changes of prior hysterectomy. Mild pelvic floor laxity. Free ascites related to peritoneal dialysis. Bones/Soft Tissues: No acute fracture or aggressive appearing lytic or blastic osseous lesion. The bones appear mildly mottled diffusely. This appearance is favored to represent demineralization/osteopenia. Renal osteodystrophy is also a possibility. Review of the MIP images confirms the above findings. IMPRESSION: VASCULAR 1. Overall vascular findings are most suggestive   of pulmonary arterial hypertension and right-sided heart dysfunction consistent with cor pulmonale. 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 2. No evidence of acute pulmonary embolus, aneurysm, or dissection. 3. Critical stenosis versus short focal occlusion of the origin of the celiac artery. The distal branch vessels  remain patent due in part to collateralization through the pancreaticoduodenal arcade. 4. Atrophic renal arteries bilaterally with associated atrophy of the native kidneys. 5. Peripheral arterial disease with high-grade stenosis versus short segment occlusion of the left common iliac artery, moderate to high-grade stenosis of the proximal right common iliac artery, high-grade stenosis of the origin of the left internal iliac artery, and mild to moderate superficial femoral artery disease (worse on the left than the right) in the visualized segments of the upper thighs. 6. No evidence of venous thrombosis or obstruction. 7. Well-positioned infrarenal potentially retrievable IVC filter. NON VASCULAR 1. Cardiomegaly with right heart enlargement. 2. Mild interstitial pulmonary edema suggests congestive heart failure. 3. Large layering right pleural effusion with associated right lower lobe atelectasis. 4. Dependent atelectasis in the left lower lobe. 5. 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 Other etiologies for cirrhosis including but not limited to chronic hepatitis, alcoholism are not excluded radiographically. 6. Peritoneal dialysis catheter coiled in the pelvis with associated ascites/dialysate. 7. Mild pelvic floor laxity. 8. Surgical changes of prior hysterectomy and cholecystectomy. 9. Diffusely mottled appearance of the bones favored to represent either demineralization/osteopenia or renal osteodystrophy. 10. Atrophic native kidneys. Signed, Heath K. McCullough, MD [facility] Electronically Signed   By: Heath  McCullough M.D.   On: 02/08/2016 10:25   Us Thoracentesis Asp Pleural Space W/img Guide  03/06/2016  INDICATION: Shortness of breath, large right pleural effusion by CT EXAM: ULTRASOUND GUIDED RIGHT THORACENTESIS MEDICATIONS: 1% lidocaine locally COMPLICATIONS: None immediate. PROCEDURE: An ultrasound guided  thoracentesis was thoroughly discussed with the patient and questions answered. The benefits, risks, alternatives and complications were also discussed. The patient understands and wishes to proceed with the procedure. Written consent was obtained. Ultrasound was performed to localize and mark an adequate pocket of fluid in the right chest. The area was then prepped and draped in the normal sterile fashion. 1% Lidocaine was used for local anesthesia. Under ultrasound guidance a 6 Fr Safe-T-Centesis catheter was introduced. Thoracentesis was performed. The catheter was removed and a dressing applied. FINDINGS: A total of approximately 1.3 L of clear pleural fluid was removed. Samples were sent to the laboratory as requested by the clinical team. IMPRESSION: Successful ultrasound guided right thoracentesis yielding 1.3 L of pleural fluid. Electronically Signed   By: M.  Shick M.D.   On: 03/06/2016 09:53    EKG: NSR, 89 bpm, incomplete RBBB, poor R wave progression, prolonged QTc at 511 msec, TWI V2, nonspecific inferior st/t changes  Weights: Filed Weights   03/04/16 1828 03/05/16 0211 03/06/16 0500  Weight: 115 lb (52.164 kg) 110 lb 8 oz (50.122 kg) 109 lb 14.4 oz (49.85 kg)     Physical Exam: Blood pressure 116/84, pulse 70, temperature 97.8 F (36.6 C), temperature source Oral, resp. rate 17, height 5' 10" (1.778 m), weight 109 lb 14.4 oz (49.85 kg), SpO2 99 %. Body mass index is 15.77 kg/(m^2). General: Well developed, well nourished, in no acute distress. Head: Normocephalic, atraumatic, sclera non-icteric, no xanthomas, nares are without discharge.  Neck: Negative for carotid bruits. JVD not elevated. Lungs: Clear bilaterally   to auscultation without wheezes, rales, or rhonchi. Breathing is unlabored. Heart: RRR with S1 S2. IV/VI systolic murmurs, no rubs, or gallops appreciated. Abdomen: Soft, non-tender, non-distended with normoactive bowel sounds. No hepatomegaly. No rebound/guarding. No  obvious abdominal masses. Msk:  Strength and tone appear normal for age. Extremities: No clubbing or cyanosis. No edema.  Distal pedal pulses are 2+ and equal bilaterally. Neuro: Alert and oriented X 3. No facial asymmetry. No focal deficit. Moves all extremities spontaneously. Psych:  Responds to questions appropriately with a normal affect.    Assessment and Plan:   1. Pleural effusion: -Likely multifactorial including the patient's mitral valve disease, combined CHF, and ESRD -Status post thoracentesis  -It is likely to be recurrent in the setting of her mitral valve disease -May need to adjust to dialysis settings  2. Mitral valve disease: -Given the above she has likely come to the point of requiring replacement -She has previously been scheduled for TEE though this was cancelled for uncertain reason -Discussed mitral valve disease in detail -She needs TEE and right and left heart cath -Will check with office to evaluate for scheduling of TEE -Cardiac cath can likely be done as an outpatient -Needs referral to [facility]  3. Chronic combined CHF: -EF has returned to normal -Volume managed by dialysis -Continue heart failure medications  4. ESRD: -On Dialysis  5. PAF: -Isolated incident  -Not on long term anticoagulation  -Currently in sinus rhythm  6. History of stroke: -Still with some residual speech difficulties  -Continue current medications, aspirin 81 mg  7. COPD: -Stable  8. Anemia of chronic disease: -Stable  9. HTN: -Continue current medications  10. HLD: -Lipitor  11. Vascular disease: -Cardiac cath as above -Lipitor  -Smoking cessation advised   Signed, Malynn Lucy, PA-C Pager: [phone] 03/06/2016, 11:20 AM   

## 2016-03-08 NOTE — Progress Notes (Signed)
[facility]  ROUNDING NOTE   Subjective:  Peritoneal dialysis was held last night [facility] However transfer has not yet occurred. Therefore we will proceed with peritoneal dialysis this a.m.    Objective:  Vital signs in last 24 hours:  Temp:  [97.8 F (36.6 C)-98.6 F (37 C)] 98 F (36.7 C) (03/25 0800) Pulse Rate:  [68-82] 78 (03/25 0800) Resp:  [16] 16 (03/25 0800) BP: (107-126)/(80-84) 126/84 mmHg (03/25 0800) SpO2:  [95 %-98 %] 95 % (03/25 0800) Weight:  [46.9 kg (103 lb 6.3 oz)] 46.9 kg (103 lb 6.3 oz) (03/25 0525)  Weight change: -1.227 kg (-2 lb 11.3 oz) Filed Weights   03/07/16 0426 03/07/16 0655 03/08/16 0525  Weight: 48.127 kg (106 lb 1.6 oz) 47.2 kg (104 lb 0.9 oz) 46.9 kg (103 lb 6.3 oz)    Intake/Output: I/O last 3 completed shifts: In: 3 [I.V.:3] Out: 983 [Urine:250; Other:733]   Intake/Output this shift:     Physical Exam: General: NAD, resting in bed  Head: Normocephalic, atraumatic. Moist oral mucosal membranes  Eyes: Anicteric  Neck: Supple, trachea midline  Lungs:  Diminished breath sounds at right base  Heart: S1S2 no rubs  Abdomen:  Soft, nontender, BS present, PD catheter present   Extremities: R > L  peripheral edema.  Neurologic: Mild right sided weakness  Skin: No lesions  Access: PD catheter in place    Basic Metabolic Panel:  Recent Labs Lab 03/04/16 1843 03/05/16 0413 03/06/16 0657 03/07/16 0431  NA 132* 131* 131* 135  K 3.7 3.7 3.7 3.9  CL 96* 100* 98* 99*  CO2 23 22 24 24   GLUCOSE 91 81 82 88  BUN 53* 54* 52* 49*  CREATININE 8.09* 8.39* 8.18* 8.22*  CALCIUM 8.2* 8.0* 7.8* 7.5*    Liver Function Tests:  Recent Labs Lab 03/06/16 0657  AST 20  ALT 16  ALKPHOS 83  BILITOT 0.5  PROT 5.3*  ALBUMIN 2.1*   No results for input(s): LIPASE, AMYLASE in the last 168 hours. No results for input(s): AMMONIA in the last 168 hours.  CBC:  Recent Labs Lab 03/04/16 1843  03/05/16 0413 03/07/16 0431  WBC 5.5 4.4 4.7  HGB 9.3* 8.1* 7.9*  HCT 28.1* 24.9* 24.3*  MCV 84.3 84.1 83.5  PLT 177 147* 130*    Cardiac Enzymes:  Recent Labs Lab 03/04/16 1843 03/05/16 0035 03/05/16 0617 03/05/16 1235  TROPONINI 0.06* 0.05* 0.04* 0.05*    BNP: Invalid input(s): POCBNP  CBG: No results for input(s): GLUCAP in the last 168 hours.  Microbiology: Results for orders placed or performed during the hospital encounter of 03/04/16  Culture, body fluid-bottle     Status: None (Preliminary result)   Collection Time: 03/06/16  9:35 AM  Result Value Ref Range Status   Specimen Description PLEURAL  Final   Special Requests NONE  Final   Gram Stain NO ORGANISMS SEEN  Final   Culture NO GROWTH 2 DAYS  Final   Report Status PENDING  Incomplete    Coagulation Studies: No results for input(s): LABPROT, INR in the last 72 hours.  Urinalysis: No results for input(s): COLORURINE, LABSPEC, PHURINE, GLUCOSEU, HGBUR, BILIRUBINUR, KETONESUR, PROTEINUR, UROBILINOGEN, NITRITE, LEUKOCYTESUR in the last 72 hours.  Invalid input(s): APPERANCEUR    Imaging: [place] Venous Img Lower Unilateral Left  03/06/2016  CLINICAL DATA:  Shortness of breath.  History of DVT. EXAM: LEFT LOWER EXTREMITY VENOUS DOPPLER ULTRASOUND TECHNIQUE: Gray-scale sonography with graded compression,  as well as color Doppler and duplex ultrasound were performed to evaluate the lower extremity deep venous systems from the level of the common femoral vein and including the common femoral, femoral, profunda femoral, popliteal and calf veins including the posterior tibial, peroneal and gastrocnemius veins when visible. The superficial great saphenous vein was also interrogated. Spectral Doppler was utilized to evaluate flow at rest and with distal augmentation maneuvers in the common femoral, femoral and popliteal veins. COMPARISON:  None. FINDINGS: Contralateral Common Femoral Vein: Respiratory phasicity is normal  and symmetric with the symptomatic side. No evidence of thrombus. Normal compressibility. Common Femoral Vein: No evidence of thrombus. Normal compressibility, respiratory phasicity and response to augmentation. Saphenofemoral Junction: No evidence of thrombus. Normal compressibility and flow on color Doppler imaging. Profunda Femoral Vein: No evidence of thrombus. Normal compressibility and flow on color Doppler imaging. Femoral Vein: No evidence of thrombus. Normal compressibility, respiratory phasicity and response to augmentation. Popliteal Vein: No evidence of thrombus. Normal compressibility, respiratory phasicity and response to augmentation. Calf Veins: No evidence of thrombus. Normal compressibility and flow on color Doppler imaging. Superficial Great Saphenous Vein: No evidence of thrombus. Normal compressibility and flow on color Doppler imaging. Other Findings:  None. IMPRESSION: No evidence of deep venous thrombosis. Electronically Signed   By: Maisie Fus  Register   On: 03/06/2016 11:20     Medications:     . aspirin  81 mg Oral Daily  . atorvastatin  80 mg Oral Daily  . calcitRIOL  0.5 mcg Oral Daily  . calcium acetate  2,001 mg Oral TID WC  . cinacalcet  30 mg Oral Daily  . dialysis solution 1.5% low-MG/low-CA   Intraperitoneal Q24H  . gentamicin cream  1 application Topical Daily  . heparin  5,000 Units Subcutaneous 3 times per day  . pantoprazole  40 mg Oral BID  . senna-docusate  1 tablet Oral BID  . sodium chloride flush  3 mL Intravenous Q12H  . tiotropium  18 mcg Inhalation Daily   acetaminophen **OR** acetaminophen, morphine injection, ondansetron **OR** ondansetron (ZOFRAN) IV, oxyCODONE-acetaminophen  Assessment/ Plan:  55 y.o. 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  1.  End-stage renal disease on peritoneal dialysis.  We did not perform peritoneal dialysis last night as transfer was pending. However patient was not transferred. Therefore we will proceed with peritoneal dialysis today. Current prescription is 8 hours, 6 exchanges, fill volume 1600 mL, 1.5% dextrose solution.  2.  Right pleural effusion. Likely related to high right-sided pressures and pulmonary hypertension given her mitral valve disease.  She will need valve replacement per cardiology.  3.  Secondary hyperparathyroidism.  Continue PhosLo, calcitriol, and Sensipar.  4.  Anemia chronic kidney disease.  Hemoglobin yesterday was 7.9. We continue to avoid erythropoietin stimulate agent therapy given history of recent CVA as this was discussed with the patient previously.  5.  Mitral valve disease.  Cardiology has recommended mitral valve replacement.  Patient would like to have [facility] at [facility].    LOS:  Cyan Moultrie 3/25/201710:28 AM

## 2016-03-08 NOTE — Progress Notes (Signed)
[facility] [place] at [facility]   PATIENT NAME: Dorothy CaffeyDonnella Long    MR#:  [id number]  DATE OF BIRTH:  Mar 15, 1961  SUBJECTIVE:  No complaints overnight overnight events [facility]  REVIEW OF SYSTEMS:  CONSTITUTIONAL: No fever, fatigue or weakness.  EYES: No blurred or double vision.  EARS, NOSE, AND THROAT: No tinnitus or ear pain.  RESPIRATORY: No cough, Positive shortness of breath, no wheezing or hemoptysis.  CARDIOVASCULAR: Positive chest pain, no orthopnea, edema.  GASTROINTESTINAL: No nausea, vomiting, diarrhea or abdominal pain.  GENITOURINARY: No dysuria, hematuria.  ENDOCRINE: No polyuria, nocturia,  HEMATOLOGY: No anemia, easy bruising or bleeding SKIN: No rash or lesion. MUSCULOSKELETAL: No joint pain or arthritis.   NEUROLOGIC: No tingling, numbness, weakness.  PSYCHIATRY: No anxiety or depression.   ROS  DRUG ALLERGIES:   Allergies  Allergen Reactions  . Buprenorphine Nausea And Vomiting  . Ciprocinonide [Fluocinolone] Hives  . Demerol [Meperidine] Nausea And Vomiting  . Diflucan [Fluconazole] Nausea And Vomiting  . Flagyl [Metronidazole] Hives and Itching  . Hydrocodone Itching  . Levaquin [Levofloxacin] Other (See Comments)    Reaction: Numbness in legs   . Morphine And Related Nausea And Vomiting  . Tetracyclines & Related Itching and Swelling  . Valium [Diazepam] Nausea And Vomiting and Other (See Comments)    Reaction:  Hallucinations     VITALS:  Blood pressure 125/90, pulse 76, temperature 98 F (36.7 C), temperature source Oral, resp. rate 20, height 5\' 10"  (1.778 m), weight 46.9 kg (103 lb 6.3 oz), SpO2 98 %.  PHYSICAL EXAMINATION:  GENERAL:  55 y.o.-year-old Thin patient lying in the bed with no acute distress.  EYES: Pupils equal, round, reactive to light and accommodation. No scleral icterus. Extraocular muscles intact.  HEENT: Head atraumatic, normocephalic. Oropharynx and  nasopharynx clear.  NECK:  Supple, no jugular venous distention. No thyroid enlargement, no tenderness.  LUNGS: Decreased breath sounds on the right lower, no wheezing, rales,rhonchi or crepitation. No use of accessory muscles of respiration.  CARDIOVASCULAR: S1, S2 normal. 3/6 murmur, rubs, or gallops.  ABDOMEN: Soft, nontender, nondistended. Bowel sounds present. No organomegaly or mass.  EXTREMITIES: No pedal edema, cyanosis, or clubbing.  NEUROLOGIC: Cranial nerves II through XII are intact. Muscle strength 5/5 in all extremities. Sensation intact. Gait not checked.  PSYCHIATRIC: The patient is alert and oriented x 3.  SKIN: No obvious rash, lesion, or ulcer.   Physical Exam LABORATORY PANEL:   CBC  Recent Labs Lab 03/07/16 0431  WBC 4.7  HGB 7.9*  HCT 24.3*  PLT 130*   ------------------------------------------------------------------------------------------------------------------  Chemistries   Recent Labs Lab 03/06/16 0657 03/07/16 0431  NA 131* 135  K 3.7 3.9  CL 98* 99*  CO2 24 24  GLUCOSE 82 88  BUN 52* 49*  CREATININE 8.18* 8.22*  CALCIUM 7.8* 7.5*  AST 20  --   ALT 16  --   ALKPHOS 83  --   BILITOT 0.5  --    ------------------------------------------------------------------------------------------------------------------  Cardiac Enzymes  Recent Labs Lab 03/05/16 0617 03/05/16 1235  TROPONINI 0.04* 0.05*   ------------------------------------------------------------------------------------------------------------------  RADIOLOGY:  No results found.  ASSESSMENT AND PLAN:   Principal Problem:   Chest pain Active Problems:   Hyperlipidemia   Chronic kidney disease with peritoneal dialysis as preferred modality, stage 5 (HCC)   GERD (gastroesophageal reflux disease)   Paroxysmal atrial fibrillation (HCC)   Chronic combined systolic and diastolic CHF (congestive heart failure) (HCC)  Mitral valve stenosis   Pleural effusion on right    Shortness of breath   Mitral valve stenosis, severe   Coronary artery disease involving native coronary artery of native heart without angina pectoris   Aortic arch atherosclerosis (HCC)   * Chest pain -  cycled her enzymes, stable, echocardiogram was done recently so no need to repeat.    As pt have Mitral stenosis, pulmonary Htn, calcification of Coronaries per CT chest and severe stenosis on intraabdominal arteries and renal artery- suggesting severe arterial disease-cardiology input appreciated   * Pleural effusion   Most likely this is the reason for the chest pain.   She did not had pulmonary embolism as checked last month with CT angiogram   Right leg DVT study is done this time and it is negative.Left leg DVT- negative.   I called pulmonary consult to help with this pleural effusion.   S/p thoracentesis 03/06/16- mostly transudative fluid.  *  Chronic kidney disease with peritoneal dialysis as preferred modality, stage 5 [facility]) - nephrology consult for assistance with dialysis and overall assessment of fluid status.  * Chronic combined systolic and diastolic CHF (congestive heart failure) (HCC) - continue home meds * Hyperlipidemia - continue home meds * GERD (gastroesophageal reflux disease) - home dose PPI * Paroxysmal atrial fibrillation (HCC) - currently in sinus rhythm, monitor with telemetry  Disposition: Patient for transfer to [facility] for mitral valve replacement  All the records are reviewed and case discussed with Care Management/Social Workerr. Management plans discussed with the patient, family and they are in agreement.  CODE STATUS: Full  TOTAL TIME TAKING CARE OF THIS PATIENT: 28 minutes.    POSSIBLE D/C IN 1DAYS, DEPENDING ON CLINICAL CONDITION.   Dalayla Aldredge,  Mardi Mainland.D on 03/08/2016   Between 7am to 6pm - Pager - [phone]  After 6pm go to [email or web address] - password [email or web address]  [facility]  Office  [phone]  CC: Primary care  physician; Sherlene Shams, MD  Note: This dictation was prepared with Dragon dictation along with smaller phrase technology. Any transcriptional errors that result from this process are unintentional.

## 2016-03-09 LAB — PHOSPHORUS: Phosphorus: 5.5 mg/dL — ABNORMAL HIGH (ref 2.5–4.6)

## 2016-03-09 NOTE — Progress Notes (Signed)
[facility] [place] [facility]   PATIENT NAME: Dorothy CaffeyDonnella Kreiter    MR#:  [id number]  DATE OF BIRTH:  [birth year]  SUBJECTIVE:  No complaints overnight overnight events [facility] In fairly good spirits, family at bedside  REVIEW OF SYSTEMS:  CONSTITUTIONAL: No fever, fatigue or weakness.  EYES: No blurred or double vision.  EARS, NOSE, AND THROAT: No tinnitus or ear pain.  RESPIRATORY: No cough, Positive shortness of breath, no wheezing or hemoptysis.  CARDIOVASCULAR: Positive chest pain, no orthopnea, edema.  GASTROINTESTINAL: No nausea, vomiting, diarrhea or abdominal pain.  GENITOURINARY: No dysuria, hematuria.  ENDOCRINE: No polyuria, nocturia,  HEMATOLOGY: No anemia, easy bruising or bleeding SKIN: No rash or lesion. MUSCULOSKELETAL: No joint pain or arthritis.   NEUROLOGIC: No tingling, numbness, weakness.  PSYCHIATRY: No anxiety or depression.   ROS  DRUG ALLERGIES:   Allergies  Allergen Reactions  . Buprenorphine Nausea And Vomiting  . Ciprocinonide [Fluocinolone] Hives  . Demerol [Meperidine] Nausea And Vomiting  . Diflucan [Fluconazole] Nausea And Vomiting  . Flagyl [Metronidazole] Hives and Itching  . Hydrocodone Itching  . Levaquin [Levofloxacin] Other (See Comments)    Reaction: Numbness in legs   . Morphine And Related Nausea And Vomiting  . Tetracyclines & Related Itching and Swelling  . Valium [Diazepam] Nausea And Vomiting and Other (See Comments)    Reaction:  Hallucinations     VITALS:  Blood pressure 122/86, pulse 77, temperature 98.3 F (36.8 C), temperature source Oral, resp. rate 20, height 5\' 10"  (1.778 m), weight 49.3 kg (108 lb 11 oz), SpO2 98 %.  PHYSICAL EXAMINATION:  GENERAL:  55 y.o.-year-old Thin patient lying in the bed with no acute distress.  EYES: Pupils equal, round, reactive to light and accommodation. No scleral icterus. Extraocular muscles intact.  HEENT: Head  atraumatic, normocephalic. Oropharynx and nasopharynx clear.  NECK:  Supple, no jugular venous distention. No thyroid enlargement, no tenderness.  LUNGS: Decreased breath sounds on the right lower, no wheezing, rales,rhonchi or crepitation. No use of accessory muscles of respiration.  CARDIOVASCULAR: S1, S2 normal. 3/6 murmur, rubs, or gallops.  ABDOMEN: Soft, nontender, nondistended. Bowel sounds present. No organomegaly or mass.  EXTREMITIES: No pedal edema, cyanosis, or clubbing.  NEUROLOGIC: Cranial nerves II through XII are intact. Muscle strength 5/5 in all extremities. Sensation intact. Gait not checked.  PSYCHIATRIC: The patient is alert and oriented x 3.  SKIN: No obvious rash, lesion, or ulcer.   Physical Exam LABORATORY PANEL:   CBC  Recent Labs Lab 03/07/16 0431  WBC 4.7  HGB 7.9*  HCT 24.3*  PLT 130*   ------------------------------------------------------------------------------------------------------------------  Chemistries   Recent Labs Lab 03/06/16 0657 03/07/16 0431  NA 131* 135  K 3.7 3.9  CL 98* 99*  CO2 24 24  GLUCOSE 82 88  BUN 52* 49*  CREATININE 8.18* 8.22*  CALCIUM 7.8* 7.5*  AST 20  --   ALT 16  --   ALKPHOS 83  --   BILITOT 0.5  --    ------------------------------------------------------------------------------------------------------------------  Cardiac Enzymes  Recent Labs Lab 03/05/16 0617 03/05/16 1235  TROPONINI 0.04* 0.05*   ------------------------------------------------------------------------------------------------------------------  RADIOLOGY:  No results found.  ASSESSMENT AND PLAN:   Principal Problem:   Chest pain Active Problems:   Hyperlipidemia   Chronic kidney disease with peritoneal dialysis as preferred modality, stage 5 (HCC)   GERD (gastroesophageal reflux disease)   Paroxysmal atrial fibrillation (HCC)   Chronic combined systolic  and diastolic CHF (congestive heart failure) (HCC)   Mitral valve  stenosis   Pleural effusion on right   Shortness of breath   Mitral valve stenosis, severe   Coronary artery disease involving native coronary artery of native heart without angina pectoris   Aortic arch atherosclerosis (HCC)   * Chest pain -  cycled her enzymes, stable, echocardiogram was done recently so no need to repeat.    As pt have Mitral stenosis, pulmonary Htn, calcification of Coronaries per CT chest and severe stenosis on intraabdominal arteries and renal artery- suggesting severe arterial disease-cardiology input appreciated   * Pleural effusion   Most likely this is the reason for the chest pain.   She did not had pulmonary embolism as checked last month with CT angiogram   Right leg DVT study is done this time and it is negative.Left leg DVT- negative.   I called pulmonary consult to help with this pleural effusion.   S/p thoracentesis 03/06/16- mostly transudative fluid.  *  Chronic kidney disease with peritoneal dialysis as preferred modality, stage 5 [facility]) - nephrology consult for assistance with dialysis and overall assessment of fluid status.  * Chronic combined systolic and diastolic CHF (congestive heart failure) (HCC) - continue home meds * Hyperlipidemia - continue home meds * GERD (gastroesophageal reflux disease) - home dose PPI * Paroxysmal atrial fibrillation (HCC) - currently in sinus rhythm, monitor with telemetry  Disposition: Patient for transfer to [facility] for mitral valve replacement  All the records are reviewed and case discussed with Care Management/Social Workerr. Management plans discussed with the patient, family and they are in agreement.  CODE STATUS: Full  TOTAL TIME TAKING CARE OF THIS PATIENT: 28 minutes.    POSSIBLE D/C IN 1DAYS, DEPENDING ON CLINICAL CONDITION.   Hower,  Mardi Mainland.D on 03/09/2016   Between 7am to 6pm - Pager - [phone]  After 6pm go to [email or web address] - password [email or web address]  [facility]  Office   [phone]  CC: Primary care physician; Sherlene Shams, MD  Note: This dictation was prepared with Dragon dictation along with smaller phrase technology. Any transcriptional errors that result from this process are unintentional.

## 2016-03-09 NOTE — Progress Notes (Signed)
[facility]  ROUNDING NOTE   Subjective:  Still no beds available for transfer to [facility]. Patient had peritoneal dialysis yesterday. Appears to be tolerating well.     Objective:  Vital signs in last 24 hours:  Temp:  [97.6 F (36.4 C)-98.2 F (36.8 C)] 97.8 F (36.6 C) (03/26 0800) Pulse Rate:  [83-86] 85 (03/26 0800) Resp:  [16-20] 16 (03/26 0800) BP: (121-147)/(80-99) 122/82 mmHg (03/26 0800) SpO2:  [97 %-98 %] 97 % (03/26 0800) Weight:  [49.3 kg (108 lb 11 oz)] 49.3 kg (108 lb 11 oz) (03/26 0426)  Weight change: 2.4 kg (5 lb 4.7 oz) Filed Weights   03/07/16 0655 03/08/16 0525 03/09/16 0426  Weight: 47.2 kg (104 lb 0.9 oz) 46.9 kg (103 lb 6.3 oz) 49.3 kg (108 lb 11 oz)    Intake/Output: I/O last 3 completed shifts: In: 126 [P.O.:120; I.V.:6] Out: 800 [Other:800]   Intake/Output this shift:     Physical Exam: General: NAD, resting in bed  Head: Normocephalic, atraumatic. Moist oral mucosal membranes  Eyes: Anicteric  Neck: Supple, trachea midline  Lungs:  Diminished breath sounds at right base  Heart: S1S2 no rubs  Abdomen:  Soft, nontender, BS present, PD catheter present   Extremities: R > L  peripheral edema.  Neurologic: Mild right sided weakness  Skin: No lesions  Access: PD catheter in place    Basic Metabolic Panel:  Recent Labs Lab 03/04/16 1843 03/05/16 0413 03/06/16 0657 03/07/16 0431  NA 132* 131* 131* 135  K 3.7 3.7 3.7 3.9  CL 96* 100* 98* 99*  CO2 GLUCOSE 91 81 82 88  BUN 53* 54* 52* 49*  CREATININE 8.09* 8.39* 8.18* 8.22*  CALCIUM 8.2* 8.0* 7.8* 7.5*    Liver Function Tests:  Recent Labs Lab 03/06/16 0657  AST 20  ALT 16  ALKPHOS 83  BILITOT 0.5  PROT 5.3*  ALBUMIN 2.1*   No results for input(s): LIPASE, AMYLASE in the last 168 hours. No results for input(s): AMMONIA in the last 168 hours.  CBC:  Recent Labs Lab 03/04/16 1843 03/05/16 0413 03/07/16 0431  WBC 5.5 4.4 4.7  HGB 9.3* 8.1*  7.9*  HCT 28.1* 24.9* 24.3*  MCV 84.3 84.1 83.5  PLT 177 147* 130*    Cardiac Enzymes:  Recent Labs Lab 03/04/16 1843 03/05/16 0035 03/05/16 0617 03/05/16 1235  TROPONINI 0.06* 0.05* 0.04* 0.05*    BNP: Invalid input(s): POCBNP  CBG: No results for input(s): GLUCAP in the last 168 hours.  Microbiology: Results for orders placed or performed during the hospital encounter of 03/04/16  Culture, body fluid-bottle     Status: None (Preliminary result)   Collection Time: 03/06/16  9:35 AM  Result Value Ref Range Status   Specimen Description PLEURAL  Final   Special Requests NONE  Final   Gram Stain NO ORGANISMS SEEN  Final   Culture NO GROWTH 3 DAYS  Final   Report Status PENDING  Incomplete    Coagulation Studies: No results for input(s): LABPROT, INR in the last 72 hours.  Urinalysis: No results for input(s): COLORURINE, LABSPEC, PHURINE, GLUCOSEU, HGBUR, BILIRUBINUR, KETONESUR, PROTEINUR, UROBILINOGEN, NITRITE, LEUKOCYTESUR in the last 72 hours.  Invalid input(s): APPERANCEUR    Imaging: No results found.   Medications:     . aspirin  81 mg Oral Daily  . atorvastatin  80 mg Oral Daily  . calcitRIOL  0.5 mcg Oral Daily  . calcium acetate  2,001 mg  Oral TID WC  . cinacalcet  30 mg Oral Daily  . dialysis solution 1.5% low-MG/low-CA   Intraperitoneal Q24H  . gentamicin cream  1 application Topical Daily  . heparin  5,000 Units Subcutaneous 3 times per day  . pantoprazole  40 mg Oral BID  . senna-docusate  1 tablet Oral BID  . sodium chloride flush  3 mL Intravenous Q12H  . tiotropium  18 mcg Inhalation Daily   acetaminophen **OR** acetaminophen, morphine injection, ondansetron **OR** ondansetron (ZOFRAN) IV, oxyCODONE-acetaminophen  Assessment/ Plan:  55 y.o. 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  1.  End-stage renal disease on peritoneal dialysis.  Continue peritoneal dialysis with 8 hours, 6 exchanges, fill volume 1600 mL, 1.5% dextrose solution.  2.  Right pleural effusion. Likely related to high right-sided pressures and pulmonary hypertension given her mitral valve disease.  She will need valve replacement per cardiology.  3.  Secondary hyperparathyroidism.  Check phosphorus today. Continue PhosLo, calcitriol, and Sensipar.  4.  Anemia chronic kidney disease.  Hemoglobin 7.9 at last check. Avoid erythropoietin stimulating agent therapy given history of recent CVA. This was discussed in depth with the patient and her family.  5.  Mitral valve disease.  Cardiology has recommended mitral valve replacement.  [facility].  [facility] However no beds currently available.    LOS:  Shadie Sweatman 3/26/20171:24 PM

## 2016-03-09 NOTE — Care Management Note (Signed)
Case Management Note  Patient Details  Name: Dorothy Long MRN: [id number] Date of Birth: July 17, 1961  Subjective/Objective:       [facility] that [name] is on the Cardiology wait list but there are currently no available beds.              Action/Plan:   Expected Discharge Date:                  Expected Discharge Plan:     In-House Referral:     Discharge planning Services     Post Acute Care Choice:    Choice offered to:     DME Arranged:    DME Agency:     HH Arranged:    HH Agency:     Status of Service:     Medicare Important Message Given:    Date Medicare IM Given:    Medicare IM give by:    Date Additional Medicare IM Given:    Additional Medicare Important Message give by:     If discussed at Long Length of Stay Meetings, dates discussed:    Additional Comments:  Cullin Dishman A, RN 03/09/2016, 9:53 AM

## 2016-03-10 ENCOUNTER — Telehealth: Payer: Self-pay | Admitting: *Deleted

## 2016-03-10 ENCOUNTER — Inpatient Hospital Stay: Payer: BC Managed Care – PPO

## 2016-03-10 DIAGNOSIS — J90 Pleural effusion, not elsewhere classified: Secondary | ICD-10-CM

## 2016-03-10 LAB — CULTURE, BODY FLUID W GRAM STAIN -BOTTLE: Gram Stain: NONE SEEN

## 2016-03-10 LAB — CBC
HCT: 24.9 % — ABNORMAL LOW (ref 35.0–47.0)
Hemoglobin: 8.2 g/dL — ABNORMAL LOW (ref 12.0–16.0)
MCH: 27.4 pg (ref 26.0–34.0)
MCHC: 32.9 g/dL (ref 32.0–36.0)
MCV: 83.5 fL (ref 80.0–100.0)
PLATELETS: 151 10*3/uL (ref 150–440)
RBC: 2.98 MIL/uL — AB (ref 3.80–5.20)
RDW: 17.2 % — AB (ref 11.5–14.5)
WBC: 4.9 10*3/uL (ref 3.6–11.0)

## 2016-03-10 LAB — COMP PANEL: LEUKEMIA/LYMPHOMA: ANNOTATION COMMENT IMP: 17

## 2016-03-10 LAB — CULTURE, BODY FLUID-BOTTLE: CULTURE: NO GROWTH

## 2016-03-10 LAB — GLUCOSE, CAPILLARY: GLUCOSE-CAPILLARY: 90 mg/dL (ref 65–99)

## 2016-03-10 LAB — PH, BODY FLUID: pH, Body Fluid: 7.9

## 2016-03-10 NOTE — Telephone Encounter (Signed)
-----   Message from Shane CrutchPradeep Ramachandran, MD sent at 03/06/2016  2:13 PM EDT ----- Regarding: F/U Please schedule CXR and hospital fu in about 4 weeks.

## 2016-03-10 NOTE — Telephone Encounter (Signed)
LMOM for pt to return call to schedule hosp f/u with CXR prior.

## 2016-03-10 NOTE — Progress Notes (Signed)
[facility] - [place] at [facility]   PATIENT NAME: Dorothy Long    MR#:  [id number]  DATE OF BIRTH:  May 03, 1961  SUBJECTIVE:  No  overnight events patient was supposed to be transferred to [facility] Patient complains of rib pain intermittently in the right side no associated cough fevers chills further symptoms  REVIEW OF SYSTEMS:  CONSTITUTIONAL: No fever, fatigue or weakness.  EYES: No blurred or double vision.  EARS, NOSE, AND THROAT: No tinnitus or ear pain.  RESPIRATORY: No cough, Positive shortness of breath, no wheezing or hemoptysis.  CARDIOVASCULAR: Positive chest pain, no orthopnea, edema.  GASTROINTESTINAL: No nausea, vomiting, diarrhea or abdominal pain.  GENITOURINARY: No dysuria, hematuria.  ENDOCRINE: No polyuria, nocturia,  HEMATOLOGY: No anemia, easy bruising or bleeding SKIN: No rash or lesion. MUSCULOSKELETAL: No joint pain or arthritis.   NEUROLOGIC: No tingling, numbness, weakness.  PSYCHIATRY: No anxiety or depression.   ROS  DRUG ALLERGIES:   Allergies  Allergen Reactions  . Buprenorphine Nausea And Vomiting  . Ciprocinonide [Fluocinolone] Hives  . Demerol [Meperidine] Nausea And Vomiting  . Diflucan [Fluconazole] Nausea And Vomiting  . Flagyl [Metronidazole] Hives and Itching  . Hydrocodone Itching  . Levaquin [Levofloxacin] Other (See Comments)    Reaction: Numbness in legs   . Morphine And Related Nausea And Vomiting  . Tetracyclines & Related Itching and Swelling  . Valium [Diazepam] Nausea And Vomiting and Other (See Comments)    Reaction:  Hallucinations     VITALS:  Blood pressure 114/79, pulse 88, temperature 98 F (36.7 C), temperature source Oral, resp. rate 18, height  (1.778 m), weight 49.714 kg (109 lb 9.6 oz), SpO2 95 %.  PHYSICAL EXAMINATION:  GENERAL:  55 y.o.-year-old Thin patient lying in the bed with no acute distress.  EYES: Pupils equal, round, reactive to light and accommodation. No scleral  icterus. Extraocular muscles intact.  HEENT: Head atraumatic, normocephalic. Oropharynx and nasopharynx clear.  NECK:  Supple, no jugular venous distention. No thyroid enlargement, no tenderness.  LUNGS: Decreased breath sounds on the right lower, no wheezing, rales,rhonchi or crepitation. No use of accessory muscles of respiration.  CARDIOVASCULAR: S1, S2 normal. 3/6 murmur, rubs, or gallops.  ABDOMEN: Soft, nontender, nondistended. Bowel sounds present. No organomegaly or mass.  EXTREMITIES: No pedal edema, cyanosis, or clubbing.  NEUROLOGIC: Cranial nerves II through XII are intact. Muscle strength 5/5 in all extremities. Sensation intact. Gait not checked.  PSYCHIATRIC: The patient is alert and oriented x 3.  SKIN: No obvious rash, lesion, or ulcer.   Physical Exam LABORATORY PANEL:   CBC  Recent Labs Lab 03/10/16 0625  WBC 4.9  HGB 8.2*  HCT 24.9*  PLT 151   ------------------------------------------------------------------------------------------------------------------  Chemistries   Recent Labs Lab 03/06/16 0657 03/07/16 0431  NA 131* 135  K 3.7 3.9  CL 98* 99*  CO2 24 24  GLUCOSE 82 88  BUN 52* 49*  CREATININE 8.18* 8.22*  CALCIUM 7.8* 7.5*  AST 20  --   ALT 16  --   ALKPHOS 83  --   BILITOT 0.5  --    ------------------------------------------------------------------------------------------------------------------  Cardiac Enzymes  Recent Labs Lab 03/05/16 0617 03/05/16 1235  TROPONINI 0.04* 0.05*   ------------------------------------------------------------------------------------------------------------------  RADIOLOGY:  Dg Chest Port 1 View  03/10/2016  CLINICAL DATA:  Chest pain, COPD, CHF EXAM: PORTABLE CHEST 1 VIEW COMPARISON:  03/06/2016 FINDINGS: There is a small right pleural effusion with basilar airspace disease. There is no other focal parenchymal opacity.  There is no left pleural effusion. There is no pneumothorax. There is stable  cardiomegaly. The osseous structures are unremarkable. IMPRESSION: 1. Small right pleural effusion with basilar airspace disease which may reflect atelectasis versus pneumonia. 2. Stable cardiomegaly. Electronically Signed   By: Elige KoHetal  Patel   On: 03/10/2016 13:49    ASSESSMENT AND PLAN:   Principal Problem:   Chest pain Active Problems:   Hyperlipidemia   Chronic kidney disease with peritoneal dialysis as preferred modality, stage 5 (HCC)   GERD (gastroesophageal reflux disease)   Paroxysmal atrial fibrillation (HCC)   Chronic combined systolic and diastolic CHF (congestive heart failure) (HCC)   Mitral valve stenosis   Pleural effusion on right   Shortness of breath   Mitral valve stenosis, severe   Coronary artery disease involving native coronary artery of native heart without angina pectoris   Aortic arch atherosclerosis (HCC)   * Chest pain - check chest x-ray, noted atelectasis with small effusion may be causing pleuritis provide incentive spirometry  * Pleural effusion-reaccumulating   S/p thoracentesis 03/06/16- mostly transudative fluid.  *  Chronic kidney disease with peritoneal dialysis as preferred modality, stage 5 [facility](HCC) - nephrology consult for assistance with dialysis and overall assessment of fluid status.  * Chronic combined systolic and diastolic CHF (congestive heart failure) (HCC) - continue home meds * Hyperlipidemia - continue home meds * GERD (gastroesophageal reflux disease) - home dose PPI * Paroxysmal atrial fibrillation (HCC) - currently in sinus rhythm, monitor with telemetry  Disposition: [facility] valve [facility] still no bed available, we'll start process to find a new hospital if no bed  All the records are reviewed and case discussed with Care Management/Social Workerr. Management plans discussed with the patient, family and they are in agreement.  CODE STATUS: Full  TOTAL TIME TAKING CARE OF THIS PATIENT: 28  minutes.    POSSIBLE D/C IN 1DAYS, DEPENDING ON CLINICAL CONDITION.   Hower,  Mardi MainlandDavid K M.D on 03/10/2016   Between 7am to 6pm - Pager - [phone]  After 6pm go to [email or web address] - password [email or web address]  [facility] [place] [facility]  Office  [phone]  CC: Primary care physician; Sherlene ShamsULLO, TERESA L, MD  Note: This dictation was prepared with Dragon dictation along with smaller phrase technology. Any transcriptional errors that result from this process are unintentional.

## 2016-03-10 NOTE — Progress Notes (Signed)
Peritoneal dialysis completed.

## 2016-03-10 NOTE — Progress Notes (Signed)
[facility]  ROUNDING NOTE   Subjective:   Did not get peritoneal dialysis last night.  Pending transfer to [facility]  Objective:  Vital signs in last 24 hours:  Temp:  [98.1 F (36.7 C)-98.5 F (36.9 C)] 98.5 F (36.9 C) (03/27 0435) Pulse Rate:  [71-93] 71 (03/27 0435) Resp:  [16-20] 16 (03/27 0435) BP: (118-135)/(80-92) 118/80 mmHg (03/27 0435) SpO2:  [96 %-98 %] 97 % (03/27 0435) Weight:  [49.714 kg (109 lb 9.6 oz)] 49.714 kg (109 lb 9.6 oz) (03/27 0435)  Weight change: 0.414 kg (14.6 oz) Filed Weights   03/08/16 0525 03/09/16 0426 03/10/16 0435  Weight: 46.9 kg (103 lb 6.3 oz) 49.3 kg (108 lb 11 oz) 49.714 kg (109 lb 9.6 oz)    Intake/Output: I/O last 3 completed shifts: In: 3 [I.V.:3] Out: 800 [Other:800]   Intake/Output this shift:     Physical Exam: General: NAD, resting in bed  Head: Normocephalic, atraumatic. Moist oral mucosal membranes  Eyes: Anicteric  Neck: Supple, trachea midline  Lungs:  Diminished breath sounds at right base  Heart: S1S2 no rubs  Abdomen:  Soft, nontender, BS present, PD catheter present   Extremities: R > L  peripheral edema.  Neurologic: Mild right sided weakness  Skin: No lesions  Access: PD catheter in place    Basic Metabolic Panel:  Recent Labs Lab 03/04/16 1843 03/05/16 0413 03/06/16 0657 03/07/16 0431 03/09/16 1357  NA 132* 131* 131* 135  --   K 3.7 3.7 3.7 3.9  --   CL 96* 100* 98* 99*  --   CO2 --   GLUCOSE 91 81 82 88  --   BUN 53* 54* 52* 49*  --   CREATININE 8.09* 8.39* 8.18* 8.22*  --   CALCIUM 8.2* 8.0* 7.8* 7.5*  --   PHOS  --   --   --   --  5.5*    Liver Function Tests:  Recent Labs Lab 03/06/16 0657  AST 20  ALT 16  ALKPHOS 83  BILITOT 0.5  PROT 5.3*  ALBUMIN 2.1*   No results for input(s): LIPASE, AMYLASE in the last 168 hours. No results for input(s): AMMONIA in the last 168 hours.  CBC:  Recent Labs Lab 03/04/16 1843 03/05/16 0413 03/07/16 0431  03/10/16 0625  WBC 5.5 4.4 4.7 4.9  HGB 9.3* 8.1* 7.9* 8.2*  HCT 28.1* 24.9* 24.3* 24.9*  MCV 84.3 84.1 83.5 83.5  PLT 177 147* 130* 151    Cardiac Enzymes:  Recent Labs Lab 03/04/16 1843 03/05/16 0035 03/05/16 0617 03/05/16 1235  TROPONINI 0.06* 0.05* 0.04* 0.05*    BNP: Invalid input(s): POCBNP  CBG:  Recent Labs Lab 03/05/16 0216  GLUCAP 90    Microbiology: Results for orders placed or performed during the hospital encounter of 03/04/16  Culture, body fluid-bottle     Status: None   Collection Time: 03/06/16  9:35 AM  Result Value Ref Range Status   Specimen Description PLEURAL  Final   Special Requests NONE  Final   Gram Stain NO ORGANISMS SEEN  Final   Culture NO GROWTH 4 DAYS  Final   Report Status 03/10/2016 FINAL  Final    Coagulation Studies: No results for input(s): LABPROT, INR in the last 72 hours.  Urinalysis: No results for input(s): COLORURINE, LABSPEC, PHURINE, GLUCOSEU, HGBUR, BILIRUBINUR, KETONESUR, PROTEINUR, UROBILINOGEN, NITRITE, LEUKOCYTESUR in the last 72 hours.  Invalid input(s): APPERANCEUR    Imaging: No results found.  Medications:     . aspirin  81 mg Oral Daily  . atorvastatin  80 mg Oral Daily  . calcitRIOL  0.5 mcg Oral Daily  . calcium acetate  2,001 mg Oral TID WC  . cinacalcet  30 mg Oral Daily  . dialysis solution 1.5% low-MG/low-CA   Intraperitoneal Q24H  . gentamicin cream  1 application Topical Daily  . heparin  5,000 Units Subcutaneous 3 times per day  . pantoprazole  40 mg Oral BID  . senna-docusate  1 tablet Oral BID  . sodium chloride flush  3 mL Intravenous Q12H  . tiotropium  18 mcg Inhalation Daily   acetaminophen **OR** acetaminophen, morphine injection, ondansetron **OR** ondansetron (ZOFRAN) IV, oxyCODONE-acetaminophen  Assessment/ Plan:  55 y.o. female with history of end-stage renal disease (FSGS) currently on PD, paroxysmal atrial fibrillation, COPD, cirrhosis, stroke with right-sided  weakness 2017,severe mitral stenosis,depression, thrombocytopenia, and negative anticardiolipin antibody, weakly positive ANA, acute GI bleed in January 2017 caused by severe esophagitis and bleeding rectal ulcer, IVC filter, pulmonary arterial hypertension, right-sided heart dysfunction consistent with cor pulmonale, significant venous engorgement of the suprahepatic IVC and hepatic veins suspicious for congestive hepatopathy and cardiac cirrhosis.  1.  End-stage renal disease on peritoneal dialysis.  Continue peritoneal dialysis with 8 hours, 6 exchanges, fill volume 1600 mL, 1.5% dextrose solution. - no dialysis last night. Next treatment for tonight.   2.  Right pleural effusion. Likely related to high right-sided pressures and pulmonary hypertension given her mitral valve disease.  She will need valve replacement per cardiology.  3.  Secondary hyperparathyroidism.  Phos 5.5  - Continue PhosLo, calcitriol, and Sensipar.  4.  Anemia chronic kidney disease.  Hemoglobin 8.2  - Avoid erythropoietin stimulating agent therapy given history of recent CVA.  5.  Mitral valve disease.  Cardiology has recommended mitral valve replacement.  [facility] would [facility].  [facility] is [facility].   6. Hypertension: difficult to control in the past.  - not currently on any medications.    LOS:  Jacere Pangborn, Threasa HeadsSARATH 3/27/201710:07 AM

## 2016-03-10 NOTE — Progress Notes (Signed)
Right rib pain, MD Hower informed, chest xray ordered

## 2016-03-10 NOTE — Progress Notes (Signed)
Pre peritoneal dialysis tx 

## 2016-03-11 ENCOUNTER — Encounter (HOSPITAL_COMMUNITY): Payer: Self-pay | Admitting: *Deleted

## 2016-03-11 ENCOUNTER — Inpatient Hospital Stay (HOSPITAL_COMMUNITY)
Admission: AD | Admit: 2016-03-11 | Discharge: 2016-03-14 | DRG: 286 | Disposition: A | Discharge status: Home Health | Payer: BC Managed Care – PPO | Source: Other Acute Inpatient Hospital | Attending: Internal Medicine | Admitting: Internal Medicine

## 2016-03-11 DIAGNOSIS — N2581 Secondary hyperparathyroidism of renal origin: Secondary | ICD-10-CM | POA: Diagnosis not present

## 2016-03-11 DIAGNOSIS — Z888 Allergy status to other drugs, medicaments and biological substances status: Secondary | ICD-10-CM | POA: Diagnosis not present

## 2016-03-11 DIAGNOSIS — I052 Rheumatic mitral stenosis with insufficiency: Secondary | ICD-10-CM | POA: Diagnosis not present

## 2016-03-11 DIAGNOSIS — I083 Combined rheumatic disorders of mitral, aortic and tricuspid valves: Principal | ICD-10-CM | POA: Diagnosis present

## 2016-03-11 DIAGNOSIS — Z79899 Other long term (current) drug therapy: Secondary | ICD-10-CM

## 2016-03-11 DIAGNOSIS — I340 Nonrheumatic mitral (valve) insufficiency: Secondary | ICD-10-CM | POA: Diagnosis not present

## 2016-03-11 DIAGNOSIS — I5033 Acute on chronic diastolic (congestive) heart failure: Secondary | ICD-10-CM | POA: Diagnosis not present

## 2016-03-11 DIAGNOSIS — Z885 Allergy status to narcotic agent status: Secondary | ICD-10-CM | POA: Diagnosis not present

## 2016-03-11 DIAGNOSIS — J90 Pleural effusion, not elsewhere classified: Secondary | ICD-10-CM | POA: Diagnosis not present

## 2016-03-11 DIAGNOSIS — I342 Nonrheumatic mitral (valve) stenosis: Secondary | ICD-10-CM | POA: Diagnosis present

## 2016-03-11 DIAGNOSIS — F329 Major depressive disorder, single episode, unspecified: Secondary | ICD-10-CM | POA: Diagnosis not present

## 2016-03-11 DIAGNOSIS — R079 Chest pain, unspecified: Secondary | ICD-10-CM | POA: Diagnosis not present

## 2016-03-11 DIAGNOSIS — I480 Paroxysmal atrial fibrillation: Secondary | ICD-10-CM | POA: Diagnosis present

## 2016-03-11 DIAGNOSIS — Z681 Body mass index (BMI) 19 or less, adult: Secondary | ICD-10-CM | POA: Diagnosis not present

## 2016-03-11 DIAGNOSIS — I272 Other secondary pulmonary hypertension: Secondary | ICD-10-CM | POA: Diagnosis not present

## 2016-03-11 DIAGNOSIS — K210 Gastro-esophageal reflux disease with esophagitis: Secondary | ICD-10-CM | POA: Diagnosis present

## 2016-03-11 DIAGNOSIS — D696 Thrombocytopenia, unspecified: Secondary | ICD-10-CM | POA: Diagnosis not present

## 2016-03-11 DIAGNOSIS — N185 Chronic kidney disease, stage 5: Secondary | ICD-10-CM | POA: Diagnosis not present

## 2016-03-11 DIAGNOSIS — E43 Unspecified severe protein-calorie malnutrition: Secondary | ICD-10-CM | POA: Diagnosis not present

## 2016-03-11 DIAGNOSIS — N186 End stage renal disease: Secondary | ICD-10-CM | POA: Diagnosis not present

## 2016-03-11 DIAGNOSIS — I5031 Acute diastolic (congestive) heart failure: Secondary | ICD-10-CM | POA: Diagnosis not present

## 2016-03-11 DIAGNOSIS — K089 Disorder of teeth and supporting structures, unspecified: Secondary | ICD-10-CM

## 2016-03-11 DIAGNOSIS — N189 Chronic kidney disease, unspecified: Secondary | ICD-10-CM

## 2016-03-11 DIAGNOSIS — I2510 Atherosclerotic heart disease of native coronary artery without angina pectoris: Secondary | ICD-10-CM | POA: Diagnosis present

## 2016-03-11 DIAGNOSIS — I5023 Acute on chronic systolic (congestive) heart failure: Secondary | ICD-10-CM | POA: Diagnosis not present

## 2016-03-11 DIAGNOSIS — Z881 Allergy status to other antibiotic agents status: Secondary | ICD-10-CM | POA: Diagnosis not present

## 2016-03-11 DIAGNOSIS — I050 Rheumatic mitral stenosis: Secondary | ICD-10-CM | POA: Diagnosis not present

## 2016-03-11 DIAGNOSIS — I5043 Acute on chronic combined systolic (congestive) and diastolic (congestive) heart failure: Secondary | ICD-10-CM | POA: Diagnosis present

## 2016-03-11 DIAGNOSIS — E1122 Type 2 diabetes mellitus with diabetic chronic kidney disease: Secondary | ICD-10-CM | POA: Diagnosis present

## 2016-03-11 DIAGNOSIS — I69320 Aphasia following cerebral infarction: Secondary | ICD-10-CM | POA: Diagnosis not present

## 2016-03-11 DIAGNOSIS — I69351 Hemiplegia and hemiparesis following cerebral infarction affecting right dominant side: Secondary | ICD-10-CM | POA: Diagnosis not present

## 2016-03-11 DIAGNOSIS — J449 Chronic obstructive pulmonary disease, unspecified: Secondary | ICD-10-CM | POA: Diagnosis present

## 2016-03-11 DIAGNOSIS — I132 Hypertensive heart and chronic kidney disease with heart failure and with stage 5 chronic kidney disease, or end stage renal disease: Secondary | ICD-10-CM | POA: Diagnosis present

## 2016-03-11 DIAGNOSIS — I252 Old myocardial infarction: Secondary | ICD-10-CM

## 2016-03-11 DIAGNOSIS — D631 Anemia in chronic kidney disease: Secondary | ICD-10-CM | POA: Diagnosis present

## 2016-03-11 DIAGNOSIS — E785 Hyperlipidemia, unspecified: Secondary | ICD-10-CM | POA: Diagnosis present

## 2016-03-11 DIAGNOSIS — K7460 Unspecified cirrhosis of liver: Secondary | ICD-10-CM | POA: Diagnosis present

## 2016-03-11 DIAGNOSIS — I509 Heart failure, unspecified: Secondary | ICD-10-CM | POA: Diagnosis not present

## 2016-03-11 DIAGNOSIS — I700 Atherosclerosis of aorta: Secondary | ICD-10-CM | POA: Diagnosis present

## 2016-03-11 DIAGNOSIS — Z992 Dependence on renal dialysis: Secondary | ICD-10-CM | POA: Diagnosis not present

## 2016-03-11 DIAGNOSIS — Z8673 Personal history of transient ischemic attack (TIA), and cerebral infarction without residual deficits: Secondary | ICD-10-CM

## 2016-03-11 DIAGNOSIS — K219 Gastro-esophageal reflux disease without esophagitis: Secondary | ICD-10-CM | POA: Diagnosis present

## 2016-03-11 DIAGNOSIS — I5042 Chronic combined systolic (congestive) and diastolic (congestive) heart failure: Secondary | ICD-10-CM | POA: Diagnosis present

## 2016-03-11 DIAGNOSIS — Z87891 Personal history of nicotine dependence: Secondary | ICD-10-CM | POA: Diagnosis not present

## 2016-03-11 DIAGNOSIS — R64 Cachexia: Secondary | ICD-10-CM | POA: Diagnosis present

## 2016-03-11 DIAGNOSIS — R0602 Shortness of breath: Secondary | ICD-10-CM | POA: Diagnosis present

## 2016-03-11 DIAGNOSIS — Z7982 Long term (current) use of aspirin: Secondary | ICD-10-CM | POA: Diagnosis not present

## 2016-03-11 DIAGNOSIS — J948 Other specified pleural conditions: Secondary | ICD-10-CM

## 2016-03-11 DIAGNOSIS — E8889 Other specified metabolic disorders: Secondary | ICD-10-CM | POA: Diagnosis present

## 2016-03-11 DIAGNOSIS — Z993 Dependence on wheelchair: Secondary | ICD-10-CM

## 2016-03-11 DIAGNOSIS — I639 Cerebral infarction, unspecified: Secondary | ICD-10-CM

## 2016-03-11 DIAGNOSIS — R0789 Other chest pain: Secondary | ICD-10-CM | POA: Diagnosis not present

## 2016-03-11 DIAGNOSIS — I10 Essential (primary) hypertension: Secondary | ICD-10-CM | POA: Diagnosis present

## 2016-03-11 HISTORY — DX: Gastro-esophageal reflux disease without esophagitis: K21.9

## 2016-03-11 HISTORY — DX: Non-ST elevation (NSTEMI) myocardial infarction: I21.4

## 2016-03-11 HISTORY — DX: Secondary hyperparathyroidism of renal origin: N25.81

## 2016-03-11 HISTORY — DX: Pleural effusion, not elsewhere classified: J90

## 2016-03-11 HISTORY — DX: Gastrointestinal hemorrhage, unspecified: K92.2

## 2016-03-11 HISTORY — DX: Paroxysmal atrial fibrillation: I48.0

## 2016-03-11 HISTORY — DX: Occlusion and stenosis of unspecified carotid artery: I65.29

## 2016-03-11 HISTORY — DX: Atherosclerosis of aorta: I70.0

## 2016-03-11 HISTORY — DX: Chronic kidney disease, stage 5: N18.5

## 2016-03-11 HISTORY — DX: Dysarthria following cerebral infarction: I69.322

## 2016-03-11 HISTORY — DX: Transient cerebral ischemic attack, unspecified: G45.9

## 2016-03-11 HISTORY — DX: Anemia in chronic kidney disease: N18.9

## 2016-03-11 HISTORY — DX: Anemia in chronic kidney disease: D63.1

## 2016-03-11 HISTORY — DX: Rheumatic mitral stenosis with insufficiency: I05.2

## 2016-03-11 LAB — CBC
HCT: 28.2 % — ABNORMAL LOW (ref 36.0–46.0)
Hemoglobin: 8.6 g/dL — ABNORMAL LOW (ref 12.0–15.0)
MCH: 26.1 pg (ref 26.0–34.0)
MCHC: 30.5 g/dL (ref 30.0–36.0)
MCV: 85.5 fL (ref 78.0–100.0)
PLATELETS: 174 10*3/uL (ref 150–400)
RBC: 3.3 MIL/uL — ABNORMAL LOW (ref 3.87–5.11)
RDW: 16.7 % — AB (ref 11.5–15.5)
WBC: 4.5 10*3/uL (ref 4.0–10.5)

## 2016-03-11 LAB — CREATININE, SERUM
Creatinine, Ser: 9.97 mg/dL — ABNORMAL HIGH (ref 0.44–1.00)
GFR, EST AFRICAN AMERICAN: 5 mL/min — AB (ref >60)
GFR, EST NON AFRICAN AMERICAN: 4 mL/min — AB (ref >60)

## 2016-03-11 LAB — PHOSPHORUS: Phosphorus: 5.4 mg/dL — ABNORMAL HIGH (ref 2.5–4.6)

## 2016-03-11 LAB — MAGNESIUM: Magnesium: 1.8 mg/dL (ref 1.7–2.4)

## 2016-03-11 LAB — BRAIN NATRIURETIC PEPTIDE

## 2016-03-11 MED ORDER — SODIUM CHLORIDE 0.9% FLUSH
3.0000 mL | Freq: Two times a day (BID) | INTRAVENOUS | Status: DC
  Administered 2016-03-11: 3 mL via INTRAVENOUS

## 2016-03-11 MED ORDER — HEPARIN 1000 UNIT/ML FOR PERITONEAL DIALYSIS
1500.0000 [IU] | INTRAMUSCULAR | Status: DC | PRN
  Filled 2016-03-11: qty 1.5

## 2016-03-11 MED ORDER — MORPHINE SULFATE (PF) 2 MG/ML IV SOLN
2.0000 mg | Freq: Once | INTRAVENOUS | Status: AC
  Administered 2016-03-11: 2 mg via INTRAVENOUS

## 2016-03-11 MED ORDER — DOCUSATE SODIUM 100 MG PO CAPS: 100.0000 mg | ORAL_CAPSULE | Freq: Every day | ORAL | Status: DC | PRN

## 2016-03-11 MED ORDER — CINACALCET HCL 30 MG PO TABS
30.0000 mg | ORAL_TABLET | Freq: Every day | ORAL | Status: DC
  Administered 2016-03-12 – 2016-03-14 (×2): 30 mg via ORAL
  Filled 2016-03-11 (×3): qty 1

## 2016-03-11 MED ORDER — DELFLEX-LC/1.5% DEXTROSE 346 MOSM/L IP SOLN
INTRAPERITONEAL | Status: DC
  Administered 2016-03-12 (×3): via INTRAPERITONEAL
  Administered 2016-03-12 – 2016-03-13 (×4): 3000 mL via INTRAPERITONEAL
  Administered 2016-03-13: 1600 mL via INTRAPERITONEAL
  Administered 2016-03-13: 3000 mL via INTRAPERITONEAL
  Administered 2016-03-14: 10:00:00 via INTRAPERITONEAL
  Administered 2016-03-14: 3000 mL via INTRAPERITONEAL

## 2016-03-11 MED ORDER — MORPHINE SULFATE (PF) 2 MG/ML IV SOLN
INTRAVENOUS | Status: AC
  Filled 2016-03-11: qty 1

## 2016-03-11 MED ORDER — GENTAMICIN SULFATE 0.1 % EX CREA
1.0000 "application " | TOPICAL_CREAM | Freq: Every day | CUTANEOUS | Status: DC
  Administered 2016-03-12 – 2016-03-14 (×4): 1 via TOPICAL
  Filled 2016-03-11 (×3): qty 15

## 2016-03-11 MED ORDER — SUCRALFATE 1 GM/10ML PO SUSP
1.0000 g | Freq: Four times a day (QID) | ORAL | Status: DC
  Administered 2016-03-11: 1 g via ORAL
  Filled 2016-03-11 (×2): qty 10

## 2016-03-11 MED ORDER — SODIUM CHLORIDE 1 G PO TABS
2.0000 g | ORAL_TABLET | Freq: Two times a day (BID) | ORAL | Status: DC
  Administered 2016-03-12 – 2016-03-14 (×3): 2 g via ORAL
  Filled 2016-03-11 (×8): qty 2

## 2016-03-11 MED ORDER — DILTIAZEM HCL 25 MG/5ML IV SOLN
5.0000 mg | Freq: Once | INTRAVENOUS | Status: DC | PRN
  Filled 2016-03-11: qty 5

## 2016-03-11 MED ORDER — ASPIRIN 81 MG PO CHEW
81.0000 mg | CHEWABLE_TABLET | Freq: Every day | ORAL | Status: DC
  Administered 2016-03-12 – 2016-03-14 (×3): 81 mg via ORAL
  Filled 2016-03-11 (×4): qty 1

## 2016-03-11 MED ORDER — SODIUM CHLORIDE 0.45 % IV SOLN
INTRAVENOUS | Status: DC
  Administered 2016-03-12: via INTRAVENOUS

## 2016-03-11 MED ORDER — DARBEPOETIN ALFA 150 MCG/0.3ML IJ SOSY
150.0000 ug | PREFILLED_SYRINGE | INTRAMUSCULAR | Status: DC
  Administered 2016-03-13: 150 ug via SUBCUTANEOUS
  Filled 2016-03-11 (×2): qty 0.3

## 2016-03-11 MED ORDER — DELFLEX-LC/1.5% DEXTROSE 346 MOSM/L IP SOLN
INTRAPERITONEAL | Status: DC
  Administered 2016-03-12: 19:00:00 via INTRAPERITONEAL

## 2016-03-11 MED ORDER — HEPARIN 1000 UNIT/ML FOR PERITONEAL DIALYSIS: 500.0000 [IU] | INTRAMUSCULAR | Status: DC | PRN

## 2016-03-11 MED ORDER — ATORVASTATIN CALCIUM 80 MG PO TABS
80.0000 mg | ORAL_TABLET | Freq: Every day | ORAL | Status: DC
  Administered 2016-03-12 – 2016-03-14 (×3): 80 mg via ORAL
  Filled 2016-03-11 (×3): qty 1

## 2016-03-11 MED ORDER — PANTOPRAZOLE SODIUM 40 MG PO TBEC
40.0000 mg | DELAYED_RELEASE_TABLET | Freq: Two times a day (BID) | ORAL | Status: DC
  Administered 2016-03-11 – 2016-03-14 (×4): 40 mg via ORAL
  Filled 2016-03-11 (×5): qty 1

## 2016-03-11 MED ORDER — CALCITRIOL 0.5 MCG PO CAPS
0.5000 ug | ORAL_CAPSULE | Freq: Every day | ORAL | Status: DC
  Administered 2016-03-12 – 2016-03-14 (×3): 0.5 ug via ORAL
  Filled 2016-03-11 (×3): qty 1

## 2016-03-11 MED ORDER — TIOTROPIUM BROMIDE MONOHYDRATE 18 MCG IN CAPS
18.0000 ug | ORAL_CAPSULE | Freq: Every day | RESPIRATORY_TRACT | Status: DC
  Administered 2016-03-13 – 2016-03-14 (×2): 18 ug via RESPIRATORY_TRACT
  Filled 2016-03-11 (×2): qty 5

## 2016-03-11 MED ORDER — CALCIUM ACETATE (PHOS BINDER) 667 MG PO CAPS
2001.0000 mg | ORAL_CAPSULE | Freq: Three times a day (TID) | ORAL | Status: DC
  Administered 2016-03-13 – 2016-03-14 (×3): 2001 mg via ORAL
  Filled 2016-03-11 (×4): qty 3

## 2016-03-11 MED ORDER — HEPARIN SODIUM (PORCINE) 5000 UNIT/ML IJ SOLN
5000.0000 [IU] | Freq: Three times a day (TID) | INTRAMUSCULAR | Status: DC
  Administered 2016-03-11 – 2016-03-12 (×2): 5000 [IU] via SUBCUTANEOUS
  Filled 2016-03-11 (×2): qty 1

## 2016-03-11 NOTE — H&P (Signed)
[facility] History and Physical  [name]:[id number] DOB: [birth year] DOA: 03/11/2016  Referring physician: Dr Clint Guy - [facility] PCP: Sherlene Shams, MD   Chief Complaint: Pleural effusion, mitral valve dysfunction, ESRD  HPI: Dorothy Long is a 55 y.o. female  Pt presenting after 1 wk admission to [facility] for CP, Pleaural effusion, Mitral valve dysfunction in need of replacement and ESRD. Pt transferred to [facility] for valve replacement by CTS after cardiac clearance (cardiac Cath  Pt currently w/o any complaint. She has baseline expressive aphasia from previous stroke.   Denies CP, SOB, palp, Syncope, HA, nausea, vomiting, rash, fever, dysuria, frequency.   History provided by [facility] hospitalist, Pt, and Pts husband.   Review of Systems:  Per HPI w/ all other systems negative.   Past Medical History  Diagnosis Date  . Hypertension   . Hyperlipidemia   . COPD (chronic obstructive pulmonary disease) (HCC)     a. not on home O2  . Anemia   . Valvular disease     a. 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 echo 2015: EF 45-50%, borderline LVH, mod dilated LA, mildly dilated RA, small pericardial effusion, mod MR (rheumatic deformity), MS mild-mod, no gradient, PASP ele  . Chronic combined systolic and diastolic CHF (congestive heart failure) (HCC)     a. echo reports above  . PAF (paroxysmal atrial fibrillation) (HCC)     a. in setting of diarrhea 09/21/2013; b. not on long term full dose anticoagulation; c. CHADSVASC at least 4 (CHF, HTN, DM, female)  . Diabetes mellitus (HCC)   . Peritoneal dialysis status (HCC) 4- 15-15  . Ventral hernia   . Inguinal hernia   . History of stress test     a. 2014: no convincing evidence of pharmacologically induced ischemia,  apparent ant apical  defect present only on attenuation corrected images favored to reflect artifact due to overcorrection & may be worse on stress imaging secondary to greater patient motion. A small area of ischemia is felt less likely but is difficult to entirely exclude, EF 55%  . ESRD (end stage renal disease) on dialysis (HCC)     a. HD on T,T,S; b. 2/2 focal segmental glomerulosclerosis   . Dialysis patient (HCC)   . Renal insufficiency   . Stroke [facility]) 11/2015    a. MRI L cereblal infarctions in the MCA/PCA, ACA/MCA territory; b. felt to be carotid in etiology; c. medically managed on DAPT per neuro   Past Surgical History  Procedure Laterality Date  . Abdominal hysterectomy    . Cholecystectomy    . Knee surgery Right   . Foot surgery Bilateral   . Av fistula placement Left 10-26-13    Dr Gilda Crease  . Peritoneal catheter insertion  03-29-14    Dr Gilda Crease  . Inguinal hernia repair Left 07/13/2015    Procedure: HERNIA REPAIR INGUINAL ADULT;  Surgeon: Earline Mayotte, MD;  Location: [facility] ORS;  Service: General;  Laterality: Left;  Marland Kitchen Ventral hernia repair N/A 07/13/2015    Procedure: HERNIA REPAIR VENTRAL ADULT;  Surgeon: Earline Mayotte, MD;  Location: [facility] ORS;  Service: General;  Laterality: N/A;   Social History:  reports that she has quit smoking. She has never used smokeless tobacco. She reports that she does not drink alcohol or use  illicit drugs.  Allergies  Allergen Reactions  . Buprenorphine Nausea And Vomiting  . Ciprocinonide [Fluocinolone] Hives  . Demerol [Meperidine] Nausea And Vomiting  . Diflucan [Fluconazole] Nausea And Vomiting  . Flagyl [Metronidazole] Hives and Itching  . Hydrocodone Itching  . Levaquin [Levofloxacin] Other (See Comments)    Reaction: Numbness in legs   . Morphine And Related Nausea And Vomiting  . Tetracyclines & Related Itching and Swelling  . Valium [Diazepam] Nausea And Vomiting and Other (See Comments)    Reaction:  Hallucinations     Family History    Problem Relation Age of Onset  . Cancer Mother     colon  . Heart disease Father   . Hypertension Father      Prior to Admission medications   Medication Sig Start Date End Date Taking? Authorizing Provider  acetaminophen (TYLENOL) 325 MG tablet Take 650 mg by mouth every 4 (four) hours as needed for mild pain or headache.     Historical Provider, MD  albuterol (PROVENTIL HFA;VENTOLIN HFA) 108 (90 Base) MCG/ACT inhaler Inhale 2 puffs into the lungs every 6 (six) hours as needed for wheezing or shortness of breath.    Historical Provider, MD  aspirin 81 MG chewable tablet Chew 81 mg by mouth daily.    Historical Provider, MD  atorvastatin (LIPITOR) 80 MG tablet Take 80 mg by mouth daily.     Historical Provider, MD  calcitRIOL (ROCALTROL) 0.5 MCG capsule Take 0.5 mcg by mouth daily.     Historical Provider, MD  calcium acetate (PHOSLO) 667 MG capsule Take 2,001 mg by mouth 3 (three) times daily with meals.    Historical Provider, MD  cinacalcet (SENSIPAR) 30 MG tablet Take 30 mg by mouth daily.    Historical Provider, MD  docusate sodium (COLACE) 100 MG capsule Take 100 mg by mouth daily as needed for mild constipation.     Historical Provider, MD  ondansetron (ZOFRAN-ODT) 4 MG disintegrating tablet Take 4 mg by mouth every 8 (eight) hours as needed for nausea or vomiting.     Historical Provider, MD  pantoprazole (PROTONIX) 40 MG tablet Take 40 mg by mouth 2 (two) times daily.     Historical Provider, MD  sodium chloride 1 g tablet Take 2 g by mouth 2 (two) times daily with a meal.    Historical Provider, MD  sucralfate (CARAFATE) 1 GM/10ML suspension Take 10 mLs (1 g total) by mouth every 6 (six) hours. 02/12/16   Houston SirenVivek J Sainani, MD  tiotropium (SPIRIVA) 18 MCG inhalation capsule Place 1 capsule (18 mcg total) into inhaler and inhale daily. 08/22/15   Carollee Leitzarrie M Doss, NP   Physical Exam: Filed Vitals:   03/11/16 1535 03/11/16 1544  BP:  116/80  Pulse:  84  Temp:  97.8 F (36.6 C)   TempSrc:  Oral  Resp:  20  Height: 5\' 10"  (1.778 m)   Weight: 50.531 kg (111 lb 6.4 oz)   SpO2:  97%    Wt Readings from Last 3 Encounters:  03/11/16 50.531 kg (111 lb 6.4 oz)  03/11/16 49.215 kg (108 lb 8 oz)  02/18/16 54.432 kg (120 lb)    General: Frail appearing Eyes:  PERRL, EOMI, normal lids, iris ENT: dry mm Neck:  no LAD, masses or thyromegaly Cardiovascular:  RRR, II/VI diastolic murmur. No LE edema.  Respiratory:  CTA bilaterally, no w/r/r. Normal respiratory effort. Abdomen:  soft, ntnd Skin:  no rash or induration seen on limited exam Musculoskeletal:  grossly normal tone BUE/BLE Psychiatric:  grossly normal mood and affect, speech fluent and appropriate Neurologic:  CN 2-12 grossly intact, expressive aphasia          Labs on Admission:  Basic Metabolic Panel:  Recent Labs Lab 03/04/16 1843 03/05/16 0413 03/06/16 0657 03/07/16 0431 03/09/16 1357  NA 132* 131* 131* 135  --   K 3.7 3.7 3.7 3.9  --   CL 96* 100* 98* 99*  --   CO2 23 22 24 24   --   GLUCOSE 91 81 82 88  --   BUN 53* 54* 52* 49*  --   CREATININE 8.09* 8.39* 8.18* 8.22*  --   CALCIUM 8.2* 8.0* 7.8* 7.5*  --   PHOS  --   --   --   --  5.5*   Liver Function Tests:  Recent Labs Lab 03/06/16 0657  AST 20  ALT 16  ALKPHOS 83  BILITOT 0.5  PROT 5.3*  ALBUMIN 2.1*   No results for input(s): LIPASE, AMYLASE in the last 168 hours. No results for input(s): AMMONIA in the last 168 hours. CBC:  Recent Labs Lab 03/04/16 1843 03/05/16 0413 03/07/16 0431 03/10/16 0625  WBC 5.5 4.4 4.7 4.9  HGB 9.3* 8.1* 7.9* 8.2*  HCT 28.1* 24.9* 24.3* 24.9*  MCV 84.3 84.1 83.5 83.5  PLT 177 147* 130* 151   Cardiac Enzymes:  Recent Labs Lab 03/04/16 1843 03/05/16 0035 03/05/16 0617 03/05/16 1235  TROPONINI 0.06* 0.05* 0.04* 0.05*    BNP (last 3 results) No results for input(s): BNP in the last 8760 hours.  ProBNP (last 3 results) No results for input(s): PROBNP in the last 8760  hours.   CREATININE: 8.22 mg/dL ABNORMAL (28/41/32 4401) Estimated creatinine clearance - 6.3 mL/min  CBG:  Recent Labs Lab 03/05/16 0216  GLUCAP 90    Radiological Exams on Admission: Dg Chest Port 1 View  03/10/2016  CLINICAL DATA:  Chest pain, COPD, CHF EXAM: PORTABLE CHEST 1 VIEW COMPARISON:  03/06/2016 FINDINGS: There is a small right pleural effusion with basilar airspace disease. There is no other focal parenchymal opacity. There is no left pleural effusion. There is no pneumothorax. There is stable cardiomegaly. The osseous structures are unremarkable. IMPRESSION: 1. Small right pleural effusion with basilar airspace disease which may reflect atelectasis versus pneumonia. 2. Stable cardiomegaly. Electronically Signed   By: Elige Ko   On: 03/10/2016 13:49     Assessment/Plan Active Problems:   Hyperlipidemia   Chronic kidney disease with peritoneal dialysis as preferred modality, stage 5 (HCC)   Mitral stenosis with incompetence or regurgitation   Anemia in chronic kidney disease   GERD (gastroesophageal reflux disease)   Paroxysmal atrial fibrillation (HCC)   Cerebral infarction due to unspecified mechanism   Pleural effusion on right   Mitral stenosis   Mitral Valve Stenosis: Per evaluation at [facility] and discussions with cardiothoracic surgery, patient is in need of valvular repair. Cardiothoracic surgery has been consult and will evaluate patient. Requesting a catheterization prior to surgery. This is been discussed with cardiology team and patient likely to go for cardiac catheterization on 03/12/2016. TEE echo performed on 03/07/2016 shows EF of 45% and severe mitral valve stenosis - f/u CTS and Cardiology recs - NPO after midnight  ESRD on peritoneal dialysis: Followed by Dr. Benson Norway of [facility] in [place]. Has fistula present but does not want hemodialysis. Called Nephrology and they are aware of pt. - f/u Nephrology recs - peritoneal dialysis per  nephrology - continue renal vitamins - CMET, mag, phos   Pleural effusion: transudate per evaluation at [facility]. Likely from complications from cardiac valvular dysfunction. Currently markedly improved from thoracentesis.  - consider Plurex if recurs - CXR   COPD: at baseline - continue spiriva and albuterol PRN  PHysical deconditioning: - PT/OT - nutrition consult - prealbumin  Anemia: Hgb 8.5. Baseline. Likely from ESRD - consider Epo - defer to nephro - CBC in am  PAF: currently rate controlled - tele - Dilt IV prn  H/o CVA:  - PT/OT  GERD: - contininue GERD  HLD: - continue lipitor     Code Status: FULL  DVT Prophylaxis: Hep Family Communication: Husband Disposition Plan: Pending Improvement    Jaimon Bugaj J, MD Family Medicine [facility] [email or web address] Password [email or web address]

## 2016-03-11 NOTE — Progress Notes (Signed)
Vital signs stable, labs stable,  Still await bed, will have physical therapy assess -- if medically stable for discharge home will plan for that with cardiology follow-up as outpatient -- if not will continue to work on transfer with different facilities [facility] ie [facility]  Full note to follow

## 2016-03-11 NOTE — Progress Notes (Signed)
Spoke with hospitalist Nicoletta Hush merrell - [facility] -about transfer -- will attempt to arrange CTS plans prior and will call back PA Eula Listenyan Dunn - [facility] cardio: coordinator Lane Hackerrisha Trent to talk with CTS coordinator August Saucerean On [facility] and [facility] will place consult  Will keep chart updated

## 2016-03-11 NOTE — Progress Notes (Signed)
[facility] called to [facility], RN will be Martie LeeSabrina, patient will transport via [facility], IV left in, tele left on until pt leaving unit, pt packed.

## 2016-03-11 NOTE — Consult Note (Signed)
Reason for Consult: To manage dialysis and dialysis related needs Referring Physician: Gladstone Lighter is an 55 y.o. 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 Cherylann Ratel in [place]. She also has COPD, cirrhosis, stroke with right-sided weakness fairly recently, thrombocytopenia, depression as well as a history of severe mitral stenosis. She's had multiple hospitalizations over the last couple of months consisting of a GI bleed from severe esophagitis as well as worsening volume overload with pleural effusion requiring thoracentesis. She was transferred here today from [facility] for consideration of a mitral valve replacement/repair. The history is obtained mostly from the chart. Patient does not seem that knowledgeable about her medical situation. She does not know her PD regimen. There are multiple family members at bedside who also do not know her PD regimen. I was able to ascertain from the chart at [facility] that she is on 6 exchanges with a fill volume of 1600 and lately have been using 1.5% not her volume status is improved- she stays on the cycler for 9 hours.. She goes dry during the day. We are asked to arrange her peritoneal dialysis while she is in-house.    Dialyzes at [facility] followed by Dr. Cherylann Ratel  - overnight for 9 hours 6 exchanges of 1600 mL- volume type is variable. She goes dry during the day  Past Medical History  Diagnosis Date  . Hypertension   . Hyperlipidemia   . COPD (chronic obstructive pulmonary disease) (HCC)     a. not on home O2  . Anemia   . Valvular disease     a. 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 echo 2015: EF 45-50%, borderline LVH, mod dilated LA, mildly dilated RA, small  pericardial effusion, mod MR (rheumatic deformity), MS mild-mod, no gradient, PASP ele  . Chronic combined systolic and diastolic CHF (congestive heart failure) (HCC)     a. echo reports above  . PAF (paroxysmal atrial fibrillation) (HCC)     a. in setting of diarrhea 09/21/2013; b. not on long term full dose anticoagulation; c. CHADSVASC at least 4 (CHF, HTN, DM, female)  . Diabetes mellitus (HCC)   . Peritoneal dialysis status (HCC) 4- 15-15  . Ventral hernia   . Inguinal hernia   . History of stress test     a. 2014: no convincing evidence of pharmacologically induced ischemia,  apparent ant apical defect present only on attenuation corrected images favored to reflect artifact due to overcorrection & may be worse on stress imaging secondary to greater patient motion. A small area of ischemia is felt less likely but is difficult to entirely exclude, EF 55%  . ESRD (end stage renal disease) on dialysis (HCC)     a. HD on T,T,S; b. 2/2 focal segmental glomerulosclerosis   . Dialysis patient (HCC)   . Renal insufficiency   . Stroke [facility]) 11/2015    a. MRI L cereblal infarctions in the MCA/PCA, ACA/MCA territory; b. felt to be carotid in etiology; c. medically managed on DAPT per neuro    Past Surgical History  Procedure Laterality Date  . Abdominal hysterectomy    . Cholecystectomy    . Knee surgery Right   . Foot surgery Bilateral   . Av fistula placement  Left 10-26-13    Dr Gilda CreaseSchnier  . Peritoneal catheter insertion  03-29-14    Dr Gilda CreaseSchnier  . Inguinal hernia repair Left 07/13/2015    Procedure: HERNIA REPAIR INGUINAL ADULT;  Surgeon: Earline MayotteJeffrey W Byrnett, MD;  Location: [facility] ORS;  Service: General;  Laterality: Left;  Marland Kitchen. Ventral hernia repair N/A 07/13/2015    Procedure: HERNIA REPAIR VENTRAL ADULT;  Surgeon: Earline MayotteJeffrey W Byrnett, MD;  Location: [facility] ORS;  Service: General;  Laterality: N/A;    Family History  Problem Relation Age of Onset  . Cancer Mother     colon  . Heart disease Father    . Hypertension Father     Social History:  reports that she has quit smoking. She has never used smokeless tobacco. She reports that she does not drink alcohol or use illicit drugs.  Allergies:  Allergies  Allergen Reactions  . Buprenorphine Nausea And Vomiting  . Ciprocinonide [Fluocinolone] Hives  . Demerol [Meperidine] Nausea And Vomiting  . Diflucan [Fluconazole] Nausea And Vomiting  . Flagyl [Metronidazole] Hives and Itching  . Hydrocodone Itching  . Levaquin [Levofloxacin] Other (See Comments)    Reaction: Numbness in legs   . Morphine And Related Nausea And Vomiting  . Tetracyclines & Related Itching and Swelling  . Valium [Diazepam] Nausea And Vomiting and Other (See Comments)    Reaction:  Hallucinations       Results for orders placed or performed during the hospital encounter of 03/04/16 (from the past 48 hour(s))  CBC     Status: Abnormal   Collection Time: 03/10/16  6:25 AM  Result Value Ref Range   WBC 4.9 3.6 - 11.0 K/uL   RBC 2.98 (L) 3.80 - 5.20 MIL/uL   Hemoglobin 8.2 (L) 12.0 - 16.0 g/dL   HCT 91.424.9 (L) 78.235.0 - 95.647.0 %   MCV 83.5 80.0 - 100.0 fL   MCH 27.4 26.0 - 34.0 pg   MCHC 32.9 32.0 - 36.0 g/dL   RDW 21.317.2 (H) 08.611.5 - 57.814.5 %   Platelets 151 150 - 440 K/uL    Dg Chest Port 1 View  03/10/2016  CLINICAL DATA:  Chest pain, COPD, CHF EXAM: PORTABLE CHEST 1 VIEW COMPARISON:  03/06/2016 FINDINGS: There is a small right pleural effusion with basilar airspace disease. There is no other focal parenchymal opacity. There is no left pleural effusion. There is no pneumothorax. There is stable cardiomegaly. The osseous structures are unremarkable. IMPRESSION: 1. Small right pleural effusion with basilar airspace disease which may reflect atelectasis versus pneumonia. 2. Stable cardiomegaly. Electronically Signed   By: Elige KoHetal  Patel   On: 03/10/2016 13:49    ROS: It is not complaining of anything at present. She admits that upon admission she was short of breath and  coughing. She denies nausea, vomiting, diarrhea, constipation or any pain the remainder of the review systems is negative Blood pressure 116/80, pulse 84, temperature 97.8 F (36.6 C), temperature source Oral, resp. rate 20, height 5\' 10"  (1.778 m), weight 50.531 kg (111 lb 6.4 oz), SpO2 97 %. General appearance: alert, no distress and Thin Eyes: conjunctivae/corneas clear. PERRL, EOM's intact. Fundi benign. Resp: clear to auscultation bilaterally Cardio: systolic murmur: late systolic 2/6, blowing throughout the precordium GI: soft, non-tender; bowel sounds normal; no masses,  no organomegaly and PD catheter in place that is nontender Extremities: extremities normal, atraumatic, no cyanosis or edema Slightly uncooperative  Assessment/Plan: 55 year old black female with multiple medical issues including ESRD on PD. She is transferred here for  consideration of a mitral valve repair/replacement 1 need for mitral valve intervention- CTS to see patient in cardiac catheterization pre-procedure is planned 2 ESRD: We'll continue her PD regimen as noted above. Continue with 1.5% fluid as volume status seems good 3 Hypertension: Is not an issue at this time 4. Anemia of ESRD: Hemoglobin is low. She's had a GI bleed in the recent past. Is on Protonix. We'll check iron stores and give her an ESA 5. Metabolic Bone Disease: She will be continued on her PhosLo, Sensipar, calcitriol and these parameters will be monitored  Thank for this consultation. We will continue to follow with you   Jada Fass A 03/11/2016, 6:56 PM

## 2016-03-11 NOTE — Discharge Summary (Addendum)
[facility] [place] [facility]   PATIENT NAME: Dorothy CaffeyDonnella Long    MR#:  [id number]  DATE OF BIRTH:  1961/04/21  DATE OF ADMISSION:  03/04/2016 ADMITTING PHYSICIAN: Oralia Manisavid Willis, MD  DATE OF DISCHARGE: No discharge date for patient encounter.  PRIMARY CARE PHYSICIAN: Sherlene ShamsULLO, TERESA L, MD    ADMISSION DIAGNOSIS:  Chest pain, unspecified Right-sided pleural effusion  DISCHARGE DIAGNOSIS:  Transudate of right-sided pleural effusion Chest pain secondary to effusion End-stage renal disease. Peritoneal dialysis Severe mitral stenosis Pulmonary hypertension SECONDARY DIAGNOSIS:   Past Medical History  Diagnosis Date  . Hypertension   . Hyperlipidemia   . COPD (chronic obstructive pulmonary disease) (HCC)     a. not on home O2  . Anemia   . Valvular disease     a. 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 echo 2015: EF 45-50%, borderline LVH, mod dilated LA, mildly dilated RA, small pericardial effusion, mod MR (rheumatic deformity), MS mild-mod, no gradient, PASP ele  . Chronic combined systolic and diastolic CHF (congestive heart failure) (HCC)     a. echo reports above  . PAF (paroxysmal atrial fibrillation) (HCC)     a. in setting of diarrhea 09/21/2013; b. not on Long term full dose anticoagulation; c. CHADSVASC at least 4 (CHF, HTN, DM, female)  . Diabetes mellitus (HCC)   . Peritoneal dialysis status (HCC) 4- 15-15  . Ventral hernia   . Inguinal hernia   . History of stress test     a. 2014: no convincing evidence of pharmacologically induced ischemia,  apparent ant apical defect present only on attenuation corrected images favored to reflect artifact due to overcorrection & may be worse on stress imaging secondary to greater patient motion. A small area of ischemia is  felt less likely but is difficult to entirely exclude, EF 55%  . ESRD (end stage renal disease) on dialysis (HCC)     a. HD on T,T,S; b. 2/2 focal segmental glomerulosclerosis   . Dialysis patient (HCC)   . Renal insufficiency   . Stroke [facility](HCC) 11/2015    a. MRI L cereblal infarctions in the MCA/PCA, ACA/MCA territory; b. felt to be carotid in etiology; c. medically managed on DAPT per neuro    HOSPITAL COURSE:  Dorothy Long  is a 55 y.o. female admitted 03/04/2016 with chief complaint Of chest pain. Please see H&P performed by Dr. Anne HahnWillis for further information. On admission to the hospital she was noted to have right-sided pleural effusion worsened than previous which is a likely explanation of her right-sided chest pain. She had workup which was negative for DVT or pulmonary embolism during her hospitalization. Given the chronic nature of effusion and worsening status he underwent thoracentesis which revealed it to be transudate in properties. She underwent further cardiac evaluation which included transesophageal echocardiogram ejection fraction 50% however severe mitral stenosis with gradient 10 mmHg at rest and elevated pulmonary pressures of 65 mmHg. Based off of her cardiac findings as well as other conditions she is deemed a good candidate for mitral valve replacement-bioprosthetic likely, given recent GI bleed. Patient will require transfer for mitral valve replacement.  DISCHARGE CONDITIONS:   Stable  CONSULTS OBTAINED:  Treatment Team:  Mady HaagensenMunsoor Lateef, MD Antonieta Ibaimothy J Gollan, MD Sondra Bargesyan M Dunn, PA-C Wyatt Hasteavid K Hower, MD  DRUG ALLERGIES:  Allergies  Allergen Reactions  . Buprenorphine Nausea And Vomiting  . Ciprocinonide [Fluocinolone] Hives  . Demerol [Meperidine] Nausea And Vomiting  . Diflucan [Fluconazole] Nausea And Vomiting  . Flagyl [Metronidazole] Hives and Itching  . Hydrocodone Itching  . Levaquin [Levofloxacin] Other (See Comments)    Reaction: Numbness in legs   .  Morphine And Related Nausea And Vomiting  . Tetracyclines & Related Itching and Swelling  . Valium [Diazepam] Nausea And Vomiting and Other (See Comments)    Reaction:  Hallucinations     DISCHARGE MEDICATIONS:   Current Discharge Medication List    CONTINUE these medications which have NOT CHANGED   Details  acetaminophen (TYLENOL) 325 MG tablet Take 650 mg by mouth every 4 (four) hours as needed for mild pain or headache.     albuterol (PROVENTIL HFA;VENTOLIN HFA) 108 (90 Base) MCG/ACT inhaler Inhale 2 puffs into the lungs every 6 (six) hours as needed for wheezing or shortness of breath.    aspirin 81 MG chewable tablet Chew 81 mg by mouth daily.    atorvastatin (LIPITOR) 80 MG tablet Take 80 mg by mouth daily.     calcitRIOL (ROCALTROL) 0.5 MCG capsule Take 0.5 mcg by mouth daily.     calcium acetate (PHOSLO) 667 MG capsule Take 2,001 mg by mouth 3 (three) times daily with meals.    cinacalcet (SENSIPAR) 30 MG tablet Take 30 mg by mouth daily.    docusate sodium (COLACE) 100 MG capsule Take 100 mg by mouth daily as needed for mild constipation.     ondansetron (ZOFRAN-ODT) 4 MG disintegrating tablet Take 4 mg by mouth every 8 (eight) hours as needed for nausea or vomiting.     pantoprazole (PROTONIX) 40 MG tablet Take 40 mg by mouth 2 (two) times daily.     sodium chloride 1 g tablet Take 2 g by mouth 2 (two) times daily with a meal.    sucralfate (CARAFATE) 1 GM/10ML suspension Take 10 mLs (1 g total) by mouth every 6 (six) hours. Qty: 420 mL, Refills: 0    tiotropium (SPIRIVA) 18 MCG inhalation capsule Place 1 capsule (18 mcg total) into inhaler and inhale daily. Qty: 30 capsule, Refills: 0         DISCHARGE INSTRUCTIONS:    DIET:  Renal diet  DISCHARGE CONDITION:  Stable  ACTIVITY:  Activity as tolerated  OXYGEN:  Home Oxygen: No.   Oxygen Delivery: room air  DISCHARGE LOCATION:  [facility]   If you experience worsening of your admission symptoms,  develop shortness of breath, life threatening emergency, suicidal or homicidal thoughts you must seek medical attention immediately by calling 911 or calling your MD immediately  if symptoms less severe.  You Must read complete instructions/literature along with all the possible adverse reactions/side effects for all the Medicines you take and that have been prescribed to you. Take any new Medicines after you have completely understood and accpet all the possible adverse reactions/side effects.   Please note  You were cared for by a hospitalist during your hospital stay. If you have any questions about your discharge medications or the care you received while you were in the hospital after you are discharged, you can call the unit and asked to speak with the hospitalist on call if the hospitalist that took care of you is not available. Once you are discharged, your primary care physician will handle any further medical issues. Please note that NO REFILLS for any discharge medications will be authorized  once you are discharged, as it is imperative that you return to your primary care physician (or establish a relationship with a primary care physician if you do not have one) for your aftercare needs so that they can reassess your need for medications and monitor your lab values.    On the day of Discharge:   VITAL SIGNS:  Blood pressure 118/76, pulse 78, temperature 98 F (36.7 C), temperature source Oral, resp. rate 18, height  (1.778 m), weight 49.215 kg (108 lb 8 oz), SpO2 99 %.  I/O:   Intake/Output Summary (Last 24 hours) at 03/11/16 1040 Last data filed at 03/11/16 0900  Gross per 24 hour  Intake    120 ml  Output    721 ml  Net   -601 ml    PHYSICAL EXAMINATION:  GENERAL:  55 y.o.-year-old patient lying in the bed with no acute distress.  EYES: Pupils equal, round, reactive to light and accommodation. No scleral icterus. Extraocular muscles intact.  HEENT: Head atraumatic,  normocephalic. Oropharynx and nasopharynx clear.  NECK:  Supple, no jugular venous distention. No thyroid enlargement, no tenderness.  LUNGS:Diminished breath sounds on right  no wheezing, rales,rhonchi or crepitation. No use of accessory muscles of respiration.  CARDIOVASCULAR: S1, S2 normal. No murmurs, rubs, or gallops.  ABDOMEN: Soft, non-tender, non-distended. Bowel sounds present. No organomegaly or mass.  EXTREMITIES: No pedal edema, cyanosis, or clubbing.  NEUROLOGIC: Cranial nerves II through XII are intact. Muscle strength 5/5 in all extremities. Sensation intact. Gait not checked.  PSYCHIATRIC: The patient is alert and oriented x 3.  SKIN: No obvious rash, lesion, or ulcer.   DATA REVIEW:   CBC  Recent Labs Lab 03/10/16 0625  WBC 4.9  HGB 8.2*  HCT 24.9*  PLT 151    Chemistries   Recent Labs Lab 03/06/16 0657 03/07/16 0431  NA 131* 135  K 3.7 3.9  CL 98* 99*  CO2 24 24  GLUCOSE 82 88  BUN 52* 49*  CREATININE 8.18* 8.22*  CALCIUM 7.8* 7.5*  AST 20  --   ALT 16  --   ALKPHOS 83  --   BILITOT 0.5  --     Cardiac Enzymes  Recent Labs Lab 03/05/16 1235  TROPONINI 0.05*    Microbiology Results  Results for orders placed or performed during the hospital encounter of 03/04/16  Culture, body fluid-bottle     Status: None   Collection Time: 03/06/16  9:35 AM  Result Value Ref Range Status   Specimen Description PLEURAL  Final   Special Requests NONE  Final   Gram Stain NO ORGANISMS SEEN  Final   Culture NO GROWTH 4 DAYS  Final   Report Status 03/10/2016 FINAL  Final    RADIOLOGY:  Dg Chest Port 1 View  03/10/2016  CLINICAL DATA:  Chest pain, COPD, CHF EXAM: PORTABLE CHEST 1 VIEW COMPARISON:  03/06/2016 FINDINGS: There is a small right pleural effusion with basilar airspace disease. There is no other focal parenchymal opacity. There is no left pleural effusion. There is no pneumothorax. There is stable cardiomegaly. The osseous structures are  unremarkable. IMPRESSION: 1. Small right pleural effusion with basilar airspace disease which may reflect atelectasis versus pneumonia. 2. Stable cardiomegaly. Electronically Signed   By: Elige Ko   On: 03/10/2016 13:49     Management plans discussed with the patient, family and they are in agreement.  CODE STATUS:     Code Status Orders  Start     Ordered   03/05/16 0210  Full code   Continuous     03/05/16 0209    Code Status History    Date Active Date Inactive Code Status Order ID Comments User Context   02/06/2016 10:18 PM 02/12/2016  4:38 PM DNR [id number]  Milagros Loll, MD ED   12/11/2015  3:28 AM 12/19/2015  4:53 PM Full Code [id number]  Ihor Austin, MD Inpatient   11/17/2015  4:50 PM 11/20/2015  3:18 PM Full Code [id number]  Houston Siren, MD Inpatient   10/11/2015  9:32 PM 10/12/2015  5:10 PM Full Code [id number]  Oralia Manis, MD Inpatient   06/23/2015 12:43 AM 06/24/2015  2:21 PM Full Code [id number]  Oralia Manis, MD Inpatient   06/15/2015  2:57 AM 06/16/2015  4:01 PM Full Code [id number]  Wyatt Haste, MD ED      TOTAL TIME TAKING CARE OF THIS PATIENT: 65 minutes.    Hower,  Mardi Mainland.D on 03/11/2016 at 10:40 AM  Between 7am to 6pm - Pager - [phone]  After 6pm go to [email or web address] - password [email or web address] [facility]  [facility]  Office  [phone]  CC: Primary care physician; Sherlene Shams, MD

## 2016-03-11 NOTE — Progress Notes (Signed)
Still await bed, will have physical therapy assess -- if medically stable for discharge home will plan for that with cardiology follow-up as outpatient -- if not will continue to work on transfer with different facilities -- ie [facility]

## 2016-03-11 NOTE — Progress Notes (Signed)
  Patient being [facility] after seven-day admission due to persistent chest pain, right-sided pleural effusion, ESRD on dialysis. Pleural effusion was thought to be due to ongoing heart failure and mitral valve regurgitation. Patient in need of mitral valve replacement. Pleural effusion is now essentially cleared after thoracentesis and is not in need of a Pleurx catheter. [facility]or[facility] was consultative and has accepted the patient pending cardiac clearance. Cardiology at [facility] is on board and is planning to perform cardiac catheterization on 03/12/2016. Patient is to be admitted to [facility] with transfer to [facility] services upon mitral valve replacement. Patient has been accepted to a telemetry bed.   Shelly Flattenavid Merrell, MD [facility] 03/11/2016, 12:44 PM

## 2016-03-11 NOTE — Care Management (Signed)
Waiting on transfer to another facility for mitral valve replacement. Dr. Clint GuyHower working on different facilities other than [facility] due to waiting list. Following.

## 2016-03-11 NOTE — Progress Notes (Signed)
[facility]  ROUNDING NOTE   Subjective:   PD yesterday. UF of ~732mL  Objective:  Vital signs in last 24 hours:  Temp:  [98 F (36.7 C)-98.1 F (36.7 C)] 98 F (36.7 C) (03/28 0442) Pulse Rate:  [78-88] 78 (03/28 0442) Resp:  [18] 18 (03/28 0442) BP: (114-138)/(76-96) 118/76 mmHg (03/28 0442) SpO2:  [95 %-99 %] 99 % (03/28 0442) Weight:  [49.215 kg (108 lb 8 oz)] 49.215 kg (108 lb 8 oz) (03/28 0442)  Weight change: -0.499 kg (-1 lb 1.6 oz) Filed Weights   03/09/16 0426 03/10/16 0435 03/11/16 0442  Weight: 49.3 kg (108 lb 11 oz) 49.714 kg (109 lb 9.6 oz) 49.215 kg (108 lb 8 oz)    Intake/Output: I/O last 3 completed shifts: In: -  Out: 721 [Other:721]   Intake/Output this shift:  Total I/O In: 120 [P.O.:120] Out: -   Physical Exam: General: NAD, resting in bed  Head: Normocephalic, atraumatic. Moist oral mucosal membranes  Eyes: Anicteric  Neck: Supple, trachea midline  Lungs:  Diminished breath sounds at right base  Heart: +murmur  Abdomen:  Soft, nontender, BS present, PD catheter present   Extremities: No  peripheral edema.  Neurologic: Mild right sided weakness  Skin: No lesions  Access: PD catheter in place    Basic Metabolic Panel:  Recent Labs Lab 03/04/16 1843 03/05/16 0413 03/06/16 0657 03/07/16 0431 03/09/16 1357  NA 132* 131* 131* 135  --   K 3.7 3.7 3.7 3.9  --   CL 96* 100* 98* 99*  --   CO2 --   GLUCOSE 91 81 82 88  --   BUN 53* 54* 52* 49*  --   CREATININE 8.09* 8.39* 8.18* 8.22*  --   CALCIUM 8.2* 8.0* 7.8* 7.5*  --   PHOS  --   --   --   --  5.5*    Liver Function Tests:  Recent Labs Lab 03/06/16 0657  AST 20  ALT 16  ALKPHOS 83  BILITOT 0.5  PROT 5.3*  ALBUMIN 2.1*   No results for input(s): LIPASE, AMYLASE in the last 168 hours. No results for input(s): AMMONIA in the last 168 hours.  CBC:  Recent Labs Lab 03/04/16 1843 03/05/16 0413 03/07/16 0431 03/10/16 0625  WBC 5.5 4.4 4.7  4.9  HGB 9.3* 8.1* 7.9* 8.2*  HCT 28.1* 24.9* 24.3* 24.9*  MCV 84.3 84.1 83.5 83.5  PLT 177 147* 130* 151    Cardiac Enzymes:  Recent Labs Lab 03/04/16 1843 03/05/16 0035 03/05/16 0617 03/05/16 1235  TROPONINI 0.06* 0.05* 0.04* 0.05*    BNP: Invalid input(s): POCBNP  CBG:  Recent Labs Lab 03/05/16 0216  GLUCAP 90    Microbiology: Results for orders placed or performed during the hospital encounter of 03/04/16  Culture, body fluid-bottle     Status: None   Collection Time: 03/06/16  9:35 AM  Result Value Ref Range Status   Specimen Description PLEURAL  Final   Special Requests NONE  Final   Gram Stain NO ORGANISMS SEEN  Final   Culture NO GROWTH 4 DAYS  Final   Report Status 03/10/2016 FINAL  Final    Coagulation Studies: No results for input(s): LABPROT, INR in the last 72 hours.  Urinalysis: No results for input(s): COLORURINE, LABSPEC, PHURINE, GLUCOSEU, HGBUR, BILIRUBINUR, KETONESUR, PROTEINUR, UROBILINOGEN, NITRITE, LEUKOCYTESUR in the last 72 hours.  Invalid input(s): APPERANCEUR    Imaging: Dg Chest [facility] 1 [address]  03/10/2016  CLINICAL DATA:  Chest pain, COPD, CHF EXAM: PORTABLE CHEST 1 VIEW COMPARISON:  03/06/2016 FINDINGS: There is a small right pleural effusion with basilar airspace disease. There is no other focal parenchymal opacity. There is no left pleural effusion. There is no pneumothorax. There is stable cardiomegaly. The osseous structures are unremarkable. IMPRESSION: 1. Small right pleural effusion with basilar airspace disease which may reflect atelectasis versus pneumonia. 2. Stable cardiomegaly. Electronically Signed   By: Elige KoHetal  Patel   On: 03/10/2016 13:49     Medications:     . aspirin  81 mg Oral Daily  . atorvastatin  80 mg Oral Daily  . calcitRIOL  0.5 mcg Oral Daily  . calcium acetate  2,001 mg Oral TID WC  . cinacalcet  30 mg Oral Daily  . dialysis solution 1.5% low-MG/low-CA   Intraperitoneal Q24H  . gentamicin cream  1  application Topical Daily  . heparin  5,000 Units Subcutaneous 3 times per day  . pantoprazole  40 mg Oral BID  . senna-docusate  1 tablet Oral BID  . sodium chloride flush  3 mL Intravenous Q12H  . tiotropium  18 mcg Inhalation Daily   acetaminophen **OR** acetaminophen, morphine injection, ondansetron **OR** ondansetron (ZOFRAN) IV, oxyCODONE-acetaminophen  Assessment/ Plan:  55 y.o. black female with history of end-stage renal disease (FSGS) currently on PD, paroxysmal atrial fibrillation, COPD, cirrhosis, stroke with right-sided weakness 2017,severe mitral stenosis,depression, thrombocytopenia, and negative anticardiolipin antibody, weakly positive ANA, acute GI bleed in January 2017 caused by severe esophagitis and bleeding rectal ulcer, IVC filter, pulmonary arterial hypertension, right-sided heart dysfunction consistent with cor pulmonale, significant venous engorgement of the suprahepatic IVC and hepatic veins suspicious for congestive hepatopathy and cardiac cirrhosis.  1.  End-stage renal disease on peritoneal dialysis.  Continue peritoneal dialysis with 8 hours, 6 exchanges, fill volume 1600 mL, 1.5% dextrose solution. - Next treatment for tonight.   2.  Right pleural effusion. Likely related to high right-sided pressures and pulmonary hypertension given her mitral valve disease.  She will need valve replacement per cardiology.  3.  Secondary hyperparathyroidism.  Phos 5.5  - Continue calcium acetate, calcitriol, and Sensipar.  4.  Anemia chronic kidney disease.  Hemoglobin 8.2  - Avoid erythropoietin stimulating agent therapy given history of recent CVA.  5.  Mitral valve disease.  Cardiology has recommended mitral valve replacement.  Patient to be transferred to tertiary facility   6. Hypertension: difficult to control in the past.  - not currently on any medications.    LOSWynelle Link:  Neleh Muldoon, Threasa HeadsSARATH 3/28/201711:05 AM

## 2016-03-11 NOTE — Progress Notes (Signed)
Report called to [facility] for patient txfer.

## 2016-03-11 NOTE — Progress Notes (Signed)
Patient: Dorothy Long / Admit Date: 03/04/2016 / Date of Encounter: 03/11/2016, 9:54 AM   Subjective: Has not been out of bed much, shortness of breath on exertion Discussed case with hospitalist, renal service and case manager [facility], This was mentioned to patient, she is willing to consider transfer to [place] today  Review of Systems: Review of Systems  Constitutional:       Weight loss  Respiratory: Positive for shortness of breath.   Cardiovascular: Negative.   Gastrointestinal: Negative.   Musculoskeletal: Negative.   Neurological: Positive for weakness.  Psychiatric/Behavioral: Negative.   All other systems reviewed and are negative.   Objective: Telemetry: NSR rate in the 80s Physical Exam: Blood pressure 118/76, pulse 78, temperature 98 F (36.7 C), temperature source Oral, resp. rate 18, height 5\' 10"  (1.778 m), weight 108 lb 8 oz (49.215 kg), SpO2 99 %. Body mass index is 15.57 kg/(m^2). General: Well developed, well nourished, in no acute distress. Head: Normocephalic, atraumatic, sclera non-icteric, no xanthomas, nares are without discharge. Neck: Negative for carotid bruits. JVD not elevated. Lungs: Clear bilaterally to auscultation without wheezes, rales, or rhonchi. Breathing is unlabored. Heart: RRR with S1 S2. IV/VI systolic murmurs, no rubs, or gallops appreciated. Abdomen: Soft, non-tender, non-distended with normoactive bowel sounds. No hepatomegaly. No rebound/guarding. No obvious abdominal masses. Msk: Strength and tone appear normal for age. Extremities: No clubbing or cyanosis. No edema. Distal pedal pulses are 2+ and equal bilaterally. Neuro: Alert and oriented X 3. No facial asymmetry. No focal deficit. Moves all extremities spontaneously. Psych: Responds to questions appropriately with a normal affect.   Intake/Output Summary (Last 24 hours) at 03/11/16 0954 Last data filed at 03/11/16 0200  Gross per 24  hour  Intake      0 ml  Output    721 ml  Net   -721 ml    Inpatient Medications:  . aspirin  81 mg Oral Daily  . atorvastatin  80 mg Oral Daily  . calcitRIOL  0.5 mcg Oral Daily  . calcium acetate  2,001 mg Oral TID WC  . cinacalcet  30 mg Oral Daily  . dialysis solution 1.5% low-MG/low-CA   Intraperitoneal Q24H  . gentamicin cream  1 application Topical Daily  . heparin  5,000 Units Subcutaneous 3 times per day  . pantoprazole  40 mg Oral BID  . senna-docusate  1 tablet Oral BID  . sodium chloride flush  3 mL Intravenous Q12H  . tiotropium  18 mcg Inhalation Daily   Infusions:    Labs:  Recent Labs  03/09/16 1357  PHOS 5.5*   No results for input(s): AST, ALT, ALKPHOS, BILITOT, PROT, ALBUMIN in the last 72 hours.  Recent Labs  03/10/16 0625  WBC 4.9  HGB 8.2*  HCT 24.9*  MCV 83.5  PLT 151   No results for input(s): CKTOTAL, CKMB, TROPONINI in the last 72 hours. Invalid input(s): POCBNP No results for input(s): HGBA1C in the last 72 hours.   Weights: Filed Weights   03/09/16 0426 03/10/16 0435 03/11/16 0442  Weight: 108 lb 11 oz (49.3 kg) 109 lb 9.6 oz (49.714 kg) 108 lb 8 oz (49.215 kg)     Radiology/Studies:  Dg Chest 1 View  03/06/2016  CLINICAL DATA:  Post right thoracentesis EXAM: CHEST 1 VIEW COMPARISON:  03/04/2016 FINDINGS: Marked cardiomegaly with central vascular congestion. Resolution of the right effusion following thoracentesis. No pneumothorax. Minor bibasilar atelectasis. Heavy calcification noted of the  mitral valve. Trachea is midline. IMPRESSION: Negative for pneumothorax following thoracentesis. Cardiomegaly without CHF Extensive mitral valve calcifications. Electronically Signed   By: Judie Petit.  Shick M.D.   On: 03/06/2016 11:13   Dg Chest 2 View  03/04/2016  CLINICAL DATA:  Right lower leg pain and swelling for 5 days. Right-sided chest pain starting yesterday. EXAM: CHEST  2 VIEW COMPARISON:  12/18/2015 FINDINGS: Cardiac enlargement with mild  central pulmonary vascular congestion suggesting arterial hypertension. Calcification projected over the heart corresponding calcification in the mitral valve annulus on prior CT. Mediastinal contours appear intact. There is increasing right pleural effusion with basilar atelectasis or consolidation in the right lung since the previous study. No pneumothorax. Vena caval filter and surgical clips demonstrated in the right upper quadrant. IMPRESSION: Cardiac enlargement with central pulmonary vascular congestion. Increasing right pleural effusion with basilar infiltration or atelectasis. Electronically Signed   By: Burman Nieves M.D.   On: 03/04/2016 19:40   Ct Chest Wo Contrast  03/05/2016  CLINICAL DATA:  End-stage renal disease with right pleural effusion and chronic congestive heart failure. EXAM: CT CHEST WITHOUT CONTRAST TECHNIQUE: Multidetector CT imaging of the chest was performed following the standard protocol without IV contrast. COMPARISON:  Chest CT February 08, 2016; chest radiograph March 04, 2016 FINDINGS: Mediastinum/Lymph Nodes: Thyroid appears unremarkable. There are subcentimeter mediastinal lymph nodes but no adenopathy by size criteria. There is no appreciable thoracic aortic aneurysm. Visualized great vessels appear unremarkable. There is generalized cardiomegaly with primarily right heart enlargement. There is a small pericardial effusion. There are multiple foci of coronary artery calcification. There is extensive mitral valve calcification. There is prominence of the central pulmonary arteries with rapid peripheral tapering. Lungs/Pleura: There remains a sizable right pleural effusion with atelectatic change in the right lower lobe. There is scarring in the lung bases. There is a rather minimal left pleural effusion with mild left base atelectasis. The somewhat nodular appearing opacity on axial images in the medial left base on prior study appears consistent with localized atelectasis  at this time. Upper abdomen: In the visualized upper abdomen, kidneys are atrophic. Liver has a somewhat nodular contour, concerning for a degree of underlying cirrhosis. There is a filter in the inferior vena cava. There is calcification in the visualized abdominal aorta. Gallbladder is absent. Musculoskeletal: Bones are somewhat sclerotic consistent with chronic renal failure. No lytic bone lesions are evident. IMPRESSION: There is a persistent sizable right pleural effusion with right base atelectasis. There is a minimal left pleural effusion with patchy scarring and atelectasis in the left base region. Small pericardial effusion. Changes indicative of a degree of cor pulmonale, stable. Extensive mitral valve and annulus calcification. There are multiple foci of coronary artery calcification. No adenopathy. Bony changes of chronic renal failure. Filter in the inferior vena cava. Kidneys atrophic. Liver has a somewhat nodular contour, concerning for a degree of underlying hepatic cirrhosis. Electronically Signed   By: Bretta Bang III M.D.   On: 03/05/2016 15:47   US Venous Img Lower Unilateral Left  03/06/2016  CLINICAL DATA:  Shortness of breath.  History of DVT. EXAM: LEFT LOWER EXTREMITY VENOUS DOPPLER ULTRASOUND TECHNIQUE: Gray-scale sonography with graded compression, as well as color Doppler and duplex ultrasound were performed to evaluate the lower extremity deep venous systems from the level of the common femoral vein and including the common femoral, femoral, profunda femoral, popliteal and calf veins including the posterior tibial, peroneal and gastrocnemius veins when visible. The superficial great saphenous vein was also  interrogated. Spectral Doppler was utilized to evaluate flow at rest and with distal augmentation maneuvers in the common femoral, femoral and popliteal veins. COMPARISON:  None. FINDINGS: Contralateral Common Femoral Vein: Respiratory phasicity is normal and symmetric with  the symptomatic side. No evidence of thrombus. Normal compressibility. Common Femoral Vein: No evidence of thrombus. Normal compressibility, respiratory phasicity and response to augmentation. Saphenofemoral Junction: No evidence of thrombus. Normal compressibility and flow on color Doppler imaging. Profunda Femoral Vein: No evidence of thrombus. Normal compressibility and flow on color Doppler imaging. Femoral Vein: No evidence of thrombus. Normal compressibility, respiratory phasicity and response to augmentation. Popliteal Vein: No evidence of thrombus. Normal compressibility, respiratory phasicity and response to augmentation. Calf Veins: No evidence of thrombus. Normal compressibility and flow on color Doppler imaging. Superficial Great Saphenous Vein: No evidence of thrombus. Normal compressibility and flow on color Doppler imaging. Other Findings:  None. IMPRESSION: No evidence of deep venous thrombosis. Electronically Signed   By: Maisie Fus  Register   On: 03/06/2016 11:20   US Venous Img Lower Unilateral Right  03/04/2016  CLINICAL DATA:  Pain and swelling in the right leg for 2 weeks. Previous history of deep venous thrombosis. Aspirin anticoagulation therapy. EXAM: Right LOWER EXTREMITY VENOUS DOPPLER ULTRASOUND TECHNIQUE: Gray-scale sonography with graded compression, as well as color Doppler and duplex ultrasound were performed to evaluate the lower extremity deep venous systems from the level of the common femoral vein and including the common femoral, femoral, profunda femoral, popliteal and calf veins including the posterior tibial, peroneal and gastrocnemius veins when visible. The superficial great saphenous vein was also interrogated. Spectral Doppler was utilized to evaluate flow at rest and with distal augmentation maneuvers in the common femoral, femoral and popliteal veins. COMPARISON:  None. FINDINGS: Contralateral Common Femoral Vein: Respiratory phasicity is normal and symmetric with the  symptomatic side. No evidence of thrombus. Normal compressibility. Common Femoral Vein: No evidence of thrombus. Normal compressibility, respiratory phasicity and response to augmentation. Saphenofemoral Junction: No evidence of thrombus. Normal compressibility and flow on color Doppler imaging. Profunda Femoral Vein: No evidence of thrombus. Normal compressibility and flow on color Doppler imaging. Femoral Vein: No evidence of thrombus. Normal compressibility, respiratory phasicity and response to augmentation. Popliteal Vein: No evidence of thrombus. Normal compressibility, respiratory phasicity and response to augmentation. Calf Veins: No evidence of thrombus. Normal compressibility and flow on color Doppler imaging. Superficial Great Saphenous Vein: No evidence of thrombus. Normal compressibility and flow on color Doppler imaging. Venous Reflux:  None. Other Findings:  None. IMPRESSION: No evidence of deep venous thrombosis. Electronically Signed   By: Burman Nieves M.D.   On: 03/04/2016 19:31   Dg Chest Port 1 View  03/10/2016  CLINICAL DATA:  Chest pain, COPD, CHF EXAM: PORTABLE CHEST 1 VIEW COMPARISON:  03/06/2016 FINDINGS: There is a small right pleural effusion with basilar airspace disease. There is no other focal parenchymal opacity. There is no left pleural effusion. There is no pneumothorax. There is stable cardiomegaly. The osseous structures are unremarkable. IMPRESSION: 1. Small right pleural effusion with basilar airspace disease which may reflect atelectasis versus pneumonia. 2. Stable cardiomegaly. Electronically Signed   By: Elige Ko   On: 03/10/2016 13:49   US Thoracentesis Asp Pleural Space W/img Guide  03/06/2016  INDICATION: Shortness of breath, large right pleural effusion by CT EXAM: ULTRASOUND GUIDED RIGHT THORACENTESIS MEDICATIONS: 1% lidocaine locally COMPLICATIONS: None immediate. PROCEDURE: An ultrasound guided thoracentesis was thoroughly discussed with the patient and  questions answered. The benefits,  risks, alternatives and complications were also discussed. The patient understands and wishes to proceed with the procedure. Written consent was obtained. Ultrasound was performed to localize and mark an adequate pocket of fluid in the right chest. The area was then prepped and draped in the normal sterile fashion. 1% Lidocaine was used for local anesthesia. Under ultrasound guidance a 6 Fr Safe-T-Centesis catheter was introduced. Thoracentesis was performed. The catheter was removed and a dressing applied. FINDINGS: A total of approximately 1.3 L of clear pleural fluid was removed. Samples were sent to the laboratory as requested by the clinical team. IMPRESSION: Successful ultrasound guided right thoracentesis yielding 1.3 L of pleural fluid. Electronically Signed   By: Judie Petit.  Shick M.D.   On: 03/06/2016 09:53     Assessment and Plan  55 y.o. female  With severe mitral valve stenosis, end-stage renal disease on hemodialysis, history of stroke, presenting with chest pain. Cardiology consult for her chest pain symptoms and significant coronary and aortic atherosclerosis seen on CT scan.   transesophageal echo this admission with severe mitral valve stenosis likely contributing to her pleural effusion, shortness of breath, difficulty with dialysis  CT scan images reviewed by myself showing moderate calcified coronary atherosclerosis as well as moderate diffuse aorta atherosclerosis  --Mitral valve stenosis Appears rheumatic, severely calcified and severely stenotic Suspect that many of her symptoms including anorexia, pleural effusion, shortness of breath on exertion is secondary to mitral valve stenosis. Needs mitral valve replacement surgery ----[facility] bed available at [facility] ([facility]), as patient not very stable for discharge, would transfer to   --Pleural effusion Suspect secondary to severe mitral valve stenosis Echocardiogram has moderate  pulmonary hypertension , again likely secondary to mitral valve disease.  --End-stage renal disease On hemodialysis, managed by Dr. Cherylann Ratel. She will have worsening hemodialysis hemodynamics given her severe antral valve stenosis  --Paroxysmal atrial fibrillation Maintaining normal sinus rhythm  High risk of recurrent arrhythmia given mitral valve stenosis  --CAD Significant coronary disease as well as aortic atherosclerosis on CT scan She will need left and right heart catheterization prior to mitral valve surgery Could be done at [facility]   Signed, Dossie Arbour, MD, Ph.D. [facility] 03/11/2016, 9:54 AM

## 2016-03-11 NOTE — Progress Notes (Signed)
Notified Dr. Konrad DoloresMerrell that Direct Admit pt has arrived on the unit.  He states that he will be up shortly to place orders, as he is currently in ED.

## 2016-03-11 NOTE — Progress Notes (Signed)
Have been told that all of the CCPD cyclers are in use so will need to convert this patient to CAPD   Dorothy Long A

## 2016-03-12 ENCOUNTER — Encounter (HOSPITAL_COMMUNITY)
Admission: AD | Disposition: A | Discharge status: Home Health | Payer: Self-pay | Source: Other Acute Inpatient Hospital | Attending: Internal Medicine

## 2016-03-12 ENCOUNTER — Other Ambulatory Visit: Payer: Self-pay | Admitting: *Deleted

## 2016-03-12 ENCOUNTER — Inpatient Hospital Stay (HOSPITAL_COMMUNITY): Payer: BC Managed Care – PPO

## 2016-03-12 ENCOUNTER — Other Ambulatory Visit (HOSPITAL_COMMUNITY): Payer: Self-pay | Admitting: Respiratory Therapy

## 2016-03-12 DIAGNOSIS — K089 Disorder of teeth and supporting structures, unspecified: Secondary | ICD-10-CM

## 2016-03-12 DIAGNOSIS — I5033 Acute on chronic diastolic (congestive) heart failure: Secondary | ICD-10-CM

## 2016-03-12 DIAGNOSIS — I509 Heart failure, unspecified: Secondary | ICD-10-CM

## 2016-03-12 DIAGNOSIS — I340 Nonrheumatic mitral (valve) insufficiency: Secondary | ICD-10-CM

## 2016-03-12 DIAGNOSIS — I2510 Atherosclerotic heart disease of native coronary artery without angina pectoris: Secondary | ICD-10-CM

## 2016-03-12 DIAGNOSIS — E43 Unspecified severe protein-calorie malnutrition: Secondary | ICD-10-CM

## 2016-03-12 DIAGNOSIS — R0789 Other chest pain: Secondary | ICD-10-CM

## 2016-03-12 DIAGNOSIS — I050 Rheumatic mitral stenosis: Secondary | ICD-10-CM

## 2016-03-12 DIAGNOSIS — I5023 Acute on chronic systolic (congestive) heart failure: Secondary | ICD-10-CM

## 2016-03-12 HISTORY — PX: CARDIAC CATHETERIZATION: SHX172

## 2016-03-12 LAB — POCT I-STAT 3, VENOUS BLOOD GAS (G3P V)
ACID-BASE DEFICIT: 2 mmol/L (ref 0.0–2.0)
Acid-base deficit: 1 mmol/L (ref 0.0–2.0)
BICARBONATE: 23.6 meq/L (ref 20.0–24.0)
Bicarbonate: 22.6 mEq/L (ref 20.0–24.0)
Bicarbonate: 23.9 mEq/L (ref 20.0–24.0)
O2 SAT: 77 %
O2 SAT: 90 %
O2 Saturation: 59 %
PCO2 VEN: 28.8 mmHg — AB (ref 45.0–50.0)
PCO2 VEN: 39.9 mmHg — AB (ref 45.0–50.0)
PH VEN: 7.381 — AB (ref 7.250–7.300)
PH VEN: 7.503 — AB (ref 7.250–7.300)
PO2 VEN: 32 mmHg (ref 31.0–45.0)
PO2 VEN: 43 mmHg (ref 31.0–45.0)
PO2 VEN: 52 mmHg — AB (ref 31.0–45.0)
TCO2: 25 mmol/L (ref 0–100)
TCO2: 25 mmol/L (ref 0–100)
TCO2: 24 mmol/L (ref 0–100)
pCO2, Ven: 42.2 mmHg — ABNORMAL LOW (ref 45.0–50.0)
pH, Ven: 7.361 — ABNORMAL HIGH (ref 7.250–7.300)

## 2016-03-12 LAB — PROTIME-INR
INR: 1.1 (ref 0.00–1.49)
PROTHROMBIN TIME: 14.4 s (ref 11.6–15.2)

## 2016-03-12 LAB — COMPREHENSIVE METABOLIC PANEL
ALT: 12 U/L — ABNORMAL LOW (ref 14–54)
ANION GAP: 17 — AB (ref 5–15)
AST: 21 U/L (ref 15–41)
Albumin: 1.9 g/dL — ABNORMAL LOW (ref 3.5–5.0)
Alkaline Phosphatase: 97 U/L (ref 38–126)
BUN: 50 mg/dL — ABNORMAL HIGH (ref 6–20)
CALCIUM: 8.4 mg/dL — AB (ref 8.9–10.3)
CHLORIDE: 96 mmol/L — AB (ref 101–111)
CO2: 21 mmol/L — AB (ref 22–32)
Creatinine, Ser: 9.81 mg/dL — ABNORMAL HIGH (ref 0.44–1.00)
GFR calc non Af Amer: 4 mL/min — ABNORMAL LOW (ref >60)
GFR, EST AFRICAN AMERICAN: 5 mL/min — AB (ref >60)
Glucose, Bld: 103 mg/dL — ABNORMAL HIGH (ref 65–99)
Potassium: 4 mmol/L (ref 3.5–5.1)
SODIUM: 134 mmol/L — AB (ref 135–145)
Total Bilirubin: 0.7 mg/dL (ref 0.3–1.2)
Total Protein: 5.5 g/dL — ABNORMAL LOW (ref 6.5–8.1)

## 2016-03-12 LAB — BLOOD GAS, VENOUS
Acid-base deficit: 2.2 mmol/L — ABNORMAL HIGH (ref 0.0–2.0)
Bicarbonate: 21.7 mEq/L (ref 20.0–24.0)
DRAWN BY: 426241
FIO2: 0.21
O2 SAT: 71.4 %
PATIENT TEMPERATURE: 98.6
TCO2: 22.8 mmol/L (ref 0–100)
pCO2, Ven: 34.7 mmHg — ABNORMAL LOW (ref 45.0–50.0)
pH, Ven: 7.413 — ABNORMAL HIGH (ref 7.250–7.300)
pO2, Ven: 41 mmHg (ref 31.0–45.0)

## 2016-03-12 LAB — POCT I-STAT 3, ART BLOOD GAS (G3+)
ACID-BASE DEFICIT: 2 mmol/L (ref 0.0–2.0)
BICARBONATE: 22.1 meq/L (ref 20.0–24.0)
O2 Saturation: 96 %
PH ART: 7.424 (ref 7.350–7.450)
TCO2: 23 mmol/L (ref 0–100)
pCO2 arterial: 33.8 mmHg — ABNORMAL LOW (ref 35.0–45.0)
pO2, Arterial: 80 mmHg (ref 80.0–100.0)

## 2016-03-12 LAB — IRON AND TIBC
Iron: 51 ug/dL (ref 28–170)
Saturation Ratios: 23 % (ref 10.4–31.8)
TIBC: 221 ug/dL — ABNORMAL LOW (ref 250–450)
UIBC: 170 ug/dL

## 2016-03-12 LAB — CBC
HCT: 25.8 % — ABNORMAL LOW (ref 36.0–46.0)
Hemoglobin: 8.1 g/dL — ABNORMAL LOW (ref 12.0–15.0)
MCH: 26.6 pg (ref 26.0–34.0)
MCHC: 31.4 g/dL (ref 30.0–36.0)
MCV: 84.9 fL (ref 78.0–100.0)
PLATELETS: 182 10*3/uL (ref 150–400)
RBC: 3.04 MIL/uL — ABNORMAL LOW (ref 3.87–5.11)
RDW: 16.7 % — ABNORMAL HIGH (ref 11.5–15.5)
WBC: 5.8 10*3/uL (ref 4.0–10.5)

## 2016-03-12 LAB — PREALBUMIN: PREALBUMIN: 23.4 mg/dL (ref 18–38)

## 2016-03-12 SURGERY — RIGHT/LEFT HEART CATH AND CORONARY ANGIOGRAPHY

## 2016-03-12 MED ORDER — NALOXONE HCL 0.4 MG/ML IJ SOLN: 0.4000 mg | Freq: Once | INTRAMUSCULAR | Status: DC

## 2016-03-12 MED ORDER — ALBUTEROL SULFATE HFA 108 (90 BASE) MCG/ACT IN AERS: 2.0000 | INHALATION_SPRAY | Freq: Four times a day (QID) | RESPIRATORY_TRACT | Status: DC | PRN

## 2016-03-12 MED ORDER — SODIUM CHLORIDE 0.9 % IV SOLN: 250.0000 mL | INTRAVENOUS | Status: DC | PRN

## 2016-03-12 MED ORDER — ONDANSETRON 4 MG PO TBDP: 4.0000 mg | ORAL_TABLET | Freq: Three times a day (TID) | ORAL | Status: DC | PRN

## 2016-03-12 MED ORDER — SODIUM CHLORIDE 0.9% FLUSH: 3.0000 mL | INTRAVENOUS | Status: DC | PRN

## 2016-03-12 MED ORDER — ONDANSETRON HCL 4 MG/2ML IJ SOLN
4.0000 mg | Freq: Four times a day (QID) | INTRAMUSCULAR | Status: DC | PRN
  Administered 2016-03-12: 4 mg via INTRAVENOUS
  Filled 2016-03-12: qty 2

## 2016-03-12 MED ORDER — LIDOCAINE HCL (PF) 1 % IJ SOLN
INTRAMUSCULAR | Status: DC | PRN
  Administered 2016-03-12: 15 mL via SUBCUTANEOUS

## 2016-03-12 MED ORDER — SODIUM CHLORIDE 0.9 % IV SOLN
510.0000 mg | Freq: Once | INTRAVENOUS | Status: DC
  Filled 2016-03-12: qty 17

## 2016-03-12 MED ORDER — ALBUTEROL SULFATE (2.5 MG/3ML) 0.083% IN NEBU
2.5000 mg | INHALATION_SOLUTION | Freq: Four times a day (QID) | RESPIRATORY_TRACT | Status: DC | PRN
Start: 2016-03-12 — End: 2016-03-14

## 2016-03-12 MED ORDER — ACETAMINOPHEN 325 MG PO TABS: 650.0000 mg | ORAL_TABLET | ORAL | Status: DC | PRN

## 2016-03-12 MED ORDER — SODIUM CHLORIDE 0.9% FLUSH: 3.0000 mL | Freq: Two times a day (BID) | INTRAVENOUS | Status: DC

## 2016-03-12 MED ORDER — FENTANYL CITRATE (PF) 100 MCG/2ML IJ SOLN
INTRAMUSCULAR | Status: AC
Start: 2016-03-12 — End: 2016-03-12
  Filled 2016-03-12: qty 2

## 2016-03-12 MED ORDER — SODIUM CHLORIDE 0.9 % IV BOLUS (SEPSIS): 250.0000 mL | Freq: Once | INTRAVENOUS | Status: DC

## 2016-03-12 MED ORDER — IOPAMIDOL (ISOVUE-370) INJECTION 76%
INTRAVENOUS | Status: DC | PRN
  Administered 2016-03-12: 60 mL

## 2016-03-12 MED ORDER — HEPARIN (PORCINE) IN NACL 2-0.9 UNIT/ML-% IJ SOLN
INTRAMUSCULAR | Status: DC | PRN
  Administered 2016-03-12: 1000 mL

## 2016-03-12 MED ORDER — MIDAZOLAM HCL 2 MG/2ML IJ SOLN
INTRAMUSCULAR | Status: AC
  Filled 2016-03-12: qty 2

## 2016-03-12 MED ORDER — HEPARIN SODIUM (PORCINE) 5000 UNIT/ML IJ SOLN
5000.0000 [IU] | Freq: Three times a day (TID) | INTRAMUSCULAR | Status: DC
  Administered 2016-03-12: 5000 [IU] via SUBCUTANEOUS
  Filled 2016-03-12: qty 1

## 2016-03-12 MED ORDER — SODIUM CHLORIDE 0.9 % IV SOLN
500.0000 mg | Freq: Once | INTRAVENOUS | Status: AC
  Administered 2016-03-12: 500 mg via INTRAVENOUS
  Filled 2016-03-12: qty 40

## 2016-03-12 MED ORDER — FENTANYL CITRATE (PF) 100 MCG/2ML IJ SOLN
INTRAMUSCULAR | Status: DC | PRN
  Administered 2016-03-12: 25 ug via INTRAVENOUS

## 2016-03-12 MED ORDER — MIDAZOLAM HCL 2 MG/2ML IJ SOLN
INTRAMUSCULAR | Status: DC | PRN
  Administered 2016-03-12: 1 mg via INTRAVENOUS

## 2016-03-12 MED ORDER — HEPARIN (PORCINE) IN NACL 2-0.9 UNIT/ML-% IJ SOLN
INTRAMUSCULAR | Status: AC
  Filled 2016-03-12: qty 1000

## 2016-03-12 MED ORDER — NALOXONE HCL 0.4 MG/ML IJ SOLN
INTRAMUSCULAR | Status: AC
  Filled 2016-03-12: qty 1

## 2016-03-12 MED ORDER — LIDOCAINE HCL (PF) 1 % IJ SOLN
INTRAMUSCULAR | Status: AC
Start: 2016-03-12 — End: 2016-03-12
  Filled 2016-03-12: qty 30

## 2016-03-12 MED ORDER — RENA-VITE PO TABS
1.0000 | ORAL_TABLET | Freq: Every day | ORAL | Status: DC
  Administered 2016-03-13: 1 via ORAL
  Filled 2016-03-12: qty 1

## 2016-03-12 SURGICAL SUPPLY — 12 items
CATH INFINITI 5FR MULTPACK ANG (CATHETERS) ×3 IMPLANT
CATH SWAN GANZ 7F STRAIGHT (CATHETERS) ×3 IMPLANT
KIT HEART LEFT (KITS) ×3 IMPLANT
KIT HEART RIGHT NAMIC (KITS) ×3 IMPLANT
PACK CARDIAC CATHETERIZATION (CUSTOM PROCEDURE TRAY) ×3 IMPLANT
SHEATH PINNACLE 5F 10CM (SHEATH) ×3 IMPLANT
SHEATH PINNACLE 7F 10CM (SHEATH) ×3 IMPLANT
SYR MEDRAD MARK V 150ML (SYRINGE) ×3 IMPLANT
TRANSDUCER W/STOPCOCK (MISCELLANEOUS) ×6 IMPLANT
TUBING CIL FLEX 10 FLL-RA (TUBING) ×3 IMPLANT
WIRE EMERALD 3MM-J .025X260CM (WIRE) ×3 IMPLANT
WIRE EMERALD 3MM-J .035X150CM (WIRE) ×3 IMPLANT

## 2016-03-12 NOTE — Progress Notes (Signed)
NP placed care order to contact cardiology for any further notifications concerning patient at this time. Patient's HR has returned to normal. 99-115. Patient is in sterile procedure at this moment. Will contact cardiologist on call if HR continues to increase.

## 2016-03-12 NOTE — Progress Notes (Signed)
Site area: rt groin Site Prior to Removal:  Level 0 Pressure Applied For:  20 minutes Manual:   yes Patient Status During Pull:  stable Post Pull Site:  Level  0 Post Pull Instructions Given:  yes Post Pull Pulses Present: yes Dressing Applied:  tegaderm Bedrest begins @ 1635 Comments:

## 2016-03-12 NOTE — Progress Notes (Signed)
Patient HR staying below 98. Patient has been assessed again.

## 2016-03-12 NOTE — Interval H&P Note (Signed)
Cath Lab Visit (complete for each Cath Lab visit)  Clinical Evaluation Leading to the Procedure:   ACS: No.  Non-ACS:    Anginal Classification: CCS III  Anti-ischemic medical therapy: Minimal Therapy (1 class of medications)  Non-Invasive Test Results: No non-invasive testing performed  Prior CABG: No previous CABG   Severe mitral stenosis   History and Physical Interval Note:  03/12/2016 2:15 PM  Dorothy Long  has presented today for surgery, with the diagnosis of cp  The various methods of treatment have been discussed with the patient and family. After consideration of risks, benefits and other options for treatment, the patient has consented to  Procedure(Long): Right/Left Heart Cath and Coronary Angiography (N/A) as a surgical intervention .  The patient'Long history has been reviewed, patient examined, no change in status, stable for surgery.  I have reviewed the patient'Long chart and labs.  Questions were answered to the patient'Long satisfaction.     Dorothy Long.

## 2016-03-12 NOTE — Progress Notes (Signed)
Initial Nutrition Assessment  DOCUMENTATION CODES:   Severe malnutrition in context of chronic illness, Underweight  INTERVENTION:  -Once diet advanced, order Boost Breeze once a day. Each supplement provides 250 kcals and 9 grams of protein. - Once diet advanced, order Nepro Shake BID. Each supplement provides 425 kcals and 19 grams of protein.   NUTRITION DIAGNOSIS:   Malnutrition related to chronic illness as evidenced by severe depletion of muscle mass, severe depletion of body fat.  GOAL:   Patient will meet greater than or equal to 90% of their needs  MONITOR:   PO intake, Supplement acceptance, Diet advancement, Labs, Skin, I & O's  REASON FOR ASSESSMENT:   Other (Comment) (BMI)    ASSESSMENT:   55 y.o. 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 Cherylann Ratel in [place]. She also has COPD, cirrhosis, stroke with right-sided weakness fairly recently, thrombocytopenia, depression as well as a history of severe mitral stenosis. She's had multiple hospitalizations over the last couple of months consisting of a GI bleed from severe esophagitis as well as worsening volume overload with pleural effusion requiring thoracentesis. She was transferred here today from [facility] for consideration of a mitral valve replacement/repair. The history is obtained mostly from the chart. Patient does not seem that knowledgeable about her medical situation. She does not know her PD regimen. There are multiple family members at bedside who also do not know her PD regimen. I was able to ascertain from the chart at [facility] that she is on 6 exchanges with a fill volume of 1600 and lately have been using 1.5% not her volume status is improved- she stays on the cycler for 9 hours.. She goes dry during the day. We are asked to arrange her peritoneal dialysis while she is in-house.   Pt with BMI of 16.51 kg/m^2 which is  categorized as underweight. Pt seen with visitor at bedside. Per chart review, pt is currently NPO and has not had food while in the hospital. Pt states that she has had no weight loss or N/V PTA. Pt reports stable weight and good appetite PTA. Pt states that she eats TID. Pt's visitor states that pt eats whatever is brought to her. Pt reports eating snacks throughout the day. When asked for diet hx, pt states she "eats everything".  Pt states no use of dietary supplements at home. Intern offered supplement once diet is advanced. Pt states preference for orange Boost Breeze. RD will order Nepro shake and Boost Breeze to supplement diet since pt is severely malnourished.   NFPE: Severe muscle depletion, severe fat depletion, no edema.   Labs reviewed: BUN /dL, Creatinine 9.60 mg/dL, GFR 5 ml/min. Medications reviewed: MVI 40 mg, Calcitriol 0.5 mcg, Calcium acetate 2001 mg TID.  Diet Order:  Diet NPO time specified Except for: Sips with Meds  Skin:  Reviewed, no issues  Last BM:  3/28  Height:   Ht Readings from Last 1 Encounters:  03/11/16  (1.778 m)    Weight:   Wt Readings from Last 1 Encounters:  03/12/16 115 lb 1.3 oz (52.2 kg)    Ideal Body Weight:  68 kg  BMI:  Body mass index is 16.51 kg/(m^2).  Estimated Nutritional Needs:   Kcal:  1800-2000  Protein:  80-90 g  Fluid:  Per MD  EDUCATION NEEDS:   No education needs identified at this time  Beryle Quant, MS [facility] Dietetic Intern Pager [phone]  _______________________  RD has read, reviewed, and agrees with dietetic intern's note. Appropriate changes have been made.   Roslyn SmilingStephanie Jair Lindblad, MS, RD, LDN Pager # [phone] After hours/ weekend pager # [phone]

## 2016-03-12 NOTE — Consult Note (Addendum)
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, mitral regurgitation, chronic combined systolic and diastolic congestive heart failure, right pleural effusion, recent embolic stroke with right-sided hemiparesis, paroxysmal atrial fibrillation not on long-term anticoagulation, 2 recent hospitalizations for recurrent upper GI bleeding, 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    The patient denies any known history of rheumatic fever or scarlet fever during childhood. The patient first presented with paroxysmal atrial fibrillation and in October 2014 in the setting of an acute illness with diarrhea. Echocardiogram performed at that time revealed ejection fraction 45-50% with diastolic dysfunction, biatrial enlargement, mild aortic insufficiency, rheumatic mitral valve disease with mild to moderate mitral stenosis, moderate mitral regurgitation and mild-to-moderate pulmonary hypertension.  She was treated medically and not placed on long-term anticoagulation therapy at that time. Follow-up echocardiogram in May 2015 revealed ejection fraction 45-50%, small pericardial effusion, moderate mitral regurgitation, rheumatic appearing mitral valve. No transvalvular gradient was recorded across  the mitral valve.  In July 2015 she presented with an acute exacerbation of chronic congestive heart failure. Dialysis therapy was adjusted.  In early December 2016 the patient presented with a near syncopal event. CT scan of the head and MRI did not reveal acute stroke. An echocardiogram was performed demonstrating ejection fraction 45%, high ventricular filling pressures, mild aortic insufficiency, moderate to severe mitral stenosis, severe left atrial enlargement. She was treated for presumed TIA of unclear etiology and it was recommended that she undergo TEE in the outpatient setting. A 30 day event monitor was recommended.  On 12/11/2015 the patient presented with an acute stroke manifest as severe dysarthria, aphasia, and right hemiplegia.  MRI and MRA of the brain revealed multifocal areas of acute infarction affecting the left hemisphere including the MCA, PCA and ACA territories.  During her hospitalization she developed paroxysmal atrial fibrillation and ruled in for an acute non-ST segment elevation myocardial infarction.  She was treated with aspirin and Plavix. She slowly recovered from her stroke and was eventually discharged to inpatient rehabilitation at [facility]  While there she suffered an acute GI bleed requiring transfusion 4 units packed red blood cells. She underwent upper and lower endoscopy during that hospitalization.  Upper endoscopy revealed esophagitis, gastritis, and duodenitis but no active bleeding. Colonoscopy revealed diverticulosis in the sigmoid colon, "friability with contact bleeding in the entire colon", and a single solitary ulcer in the rectum that was treated with a clip.  During her hospitalization she was also found to have an acute DVT and an IVC filter was placed. She was returned to inpatient rehabilitation and eventually discharged home in early February on only low dose aspirin for anticoagulation.    The patient was hospitalized with another GI bleed and hemoglobin  4.5 at [facility] on 02/06/2016.  She was transfused  2 units packed red blood cells. Aspirin was discontinued. Repeat endoscopy was not performed.  Most recently the patient was hospitalized on 03/04/2016 at [facility] with acute exacerbation of congestive heart failure with shortness of breath and right-sided chest pain.  Troponin levels were minimally elevated and remained flat following hospital admission.  CT angiogram of the chest was negative for PE but revealed a large right pleural effusion, findings suggestive of pulmonary hypertension with right-sided heart failure and cor pulmonale.  In addition there appeared to be critical stenosis versus short segment focal occlusion of the origin of the celiac artery. There was severe peripheral arterial disease with high-grade stenosis versus short segment occlusion of the left common iliac artery and moderate to high-grade stenosis of the proximal right common iliac artery.  The patient underwent right thoracentesis on 03/06/2016 yielding 1.3 L of transudative fluid.  Transthoracic echocardiogram performed 02/09/2016 revealed left ventricular ejection fraction estimated 55-60%. There was mild aortic insufficiency. There was moderate mitral regurgitation and severe mitral stenosis. Mean transvalvular gradient across the mitral valve was estimated 13 mmHg.  There was moderate left atrial enlargement and moderate tricuspid regurgitation.  Transesophageal echocardiogram was performed 03/07/2016. Ejection fraction was estimated at 45-50%. There were no regional wall motion abnormalities appreciated. There was mild aortic insufficiency. There was severe mitral stenosis with mean transvalvular gradient estimated 10 mmHg. There was moderate mitral regurgitation. There was severe left atrial enlargement. There was no left atrial thrombus.  There was moderate to severe right atrial enlargement and mild-to-moderate tricuspid  regurgitation. The patient was [facility]. Diagnostic cardiac catheterization performed 03/12/2016 revealed multivessel coronary artery disease with 99% subtotal occlusion of the mid right coronary artery with left-to-right collaterals filling the distal portion of the vessel. There was a tubular stenosis of the mid left anterior descending coronary artery that was graded 75%. There was normal left ventricular systolic function. There was presence of a "basal inferior aneurysmal segment". There was severe mitral stenosis with mean transvalvular gradient estimated 15 mmHg based on PCW pressure and mitral valve area calculated 1.1 cm. There was moderate pulmonary hypertension with PA pressures measured 50/28.  Cardiothoracic surgical consultation was requested.  The patient is married and lives [place] her [place] in [place].  She has been completely nonambulatory since the stroke she suffered in December. She is wheelchair-bound. She requires assistance to get from the bed to the wheelchair. Prior to her stroke she was fully ambulatory and functional.  The patient, her husband, and her sister all noted that she has never really recovered from her stroke. She claims that her appetite is good but she has not been eating well. She has a long history of symptoms of exertional shortness of breath dating back many years. According to the family she has been hospitalized several times for acute exacerbation of congestive heart failure over the last few years. Typically such acute exacerbations have been managed by adjustment to her dialysis therapy. She has been on chronic ambulatory peritoneal dialysis for approximately 2 years. Prior to that she was on hemodialysis for a short period of time. She apparently did not do well with hemodialysis.  She denies any history of exertional chest pain or chest tightness. She states that when she was first hospitalized little over a week ago she had  right-sided chest pain that improved following thoracentesis. At the time of hospitalization she had resting shortness of breath. Her breathing has been improved since thoracentesis.  She has  not had any recent history of hematochezia, hematemesis, or melena over the past 2 weeks.   Past Medical History  Diagnosis Date  . Hypertension   . Hyperlipidemia   . COPD (chronic obstructive pulmonary disease) (HCC)     a. not on home O2  . Anemia   . Valvular disease     a. 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 echo 2015: EF 45-50%, borderline LVH, mod dilated LA, mildly dilated RA, small pericardial effusion, mod MR (rheumatic deformity), MS mild-mod, no gradient, PASP ele  . Chronic combined systolic and diastolic CHF (congestive heart failure) (HCC)   . PAF (paroxysmal atrial fibrillation) (HCC)     a. in setting of diarrhea 09/21/2013; b. not on long term full dose anticoagulation; c. CHADSVASC at least 4 (CHF, HTN, DM, female)  . Diabetes mellitus (HCC)   . Peritoneal dialysis status (HCC) 4- 15-15  . Ventral hernia   . Inguinal hernia   . ESRD (end stage renal disease) on dialysis (HCC)     a. HD on T,T,S; b. 2/2 focal segmental glomerulosclerosis   . Dialysis patient (HCC)   . Renal insufficiency   . Stroke [facility]) 11/2015    a. MRI L cereblal infarctions in the MCA/PCA, ACA/MCA territory; b. felt to be carotid in etiology; c. medically managed on DAPT per neuro  . Anemia in chronic kidney disease   . Aortic arch atherosclerosis (HCC)   . Carotid stenosis   . Chronic kidney disease with peritoneal dialysis as preferred modality, stage 5 (HCC) 07/14/2013    Secondary to FSGS with atrophic right kidney.    . 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 GERD (gastroesophageal reflux disease)   .  Hyperparathyroidism due to renal insufficiency (HCC)   . Mitral valve stenosis, severe   . Mitral stenosis with incompetence or regurgitation     EF 45 to 50% Rheumatic mitral valve Mild mitral stenosis. Mean gradient  14 mm Hg Moderate mitral regirg Severe thickening and calcificatin of posterior mitral leaglet.  Mild aortic regurgitation , sclerosis without stenosis Mild tricuspid regurgitation ((*Per ECHO report July 25, [facility])   . Rheumatic mitral stenosis with regurgitation   . NSTEMI (non-ST elevated myocardial infarction) (HCC)   . Paroxysmal atrial fibrillation (HCC) 09/25/2013  . Pleural effusion on right   . TIA (transient ischemic attack) 12/11/2015  . Upper GI bleed 02/06/2016    Past Surgical History  Procedure Laterality Date  . Abdominal hysterectomy    . Cholecystectomy    . Knee surgery Right   . Foot surgery Bilateral   . Av fistula placement Left 10-26-13    Dr Gilda Crease  . Peritoneal catheter insertion  03-29-14    Dr Gilda Crease  . Inguinal hernia repair Left 07/13/2015    Procedure: HERNIA REPAIR INGUINAL ADULT;  Surgeon: Earline Mayotte, MD;  Location: [facility] ORS;  Service: General;  Laterality: Left;  Marland Kitchen Ventral hernia repair N/A 07/13/2015    Procedure: HERNIA REPAIR VENTRAL ADULT;  Surgeon: Earline Mayotte, MD;  Location: [facility] ORS;  Service: General;  Laterality: N/A;    Family History  Problem Relation Age of Onset  . Cancer Mother     colon  . Heart disease Father   . Hypertension Father     Social History   Social History  .  Marital Status: Married    Spouse Name: N/A  . Number of Children: N/A  . Years of Education: N/A   Occupational History  . disabled    Social History Main Topics  . Smoking status: Former Smoker -- 0.00 packs/day for 0 years  . Smokeless tobacco: Never Used  . Alcohol Use: No     Comment: occasional  . Drug Use: No  . Sexual Activity: No   Other Topics Concern  . Not on file   Social History Narrative    Prior to  Admission medications   Medication Sig Start Date End Date Taking? Authorizing Provider  acetaminophen (TYLENOL) 325 MG tablet Take 650 mg by mouth every 4 (four) hours as needed for mild pain or headache.    Yes Historical Provider, MD  albuterol (PROVENTIL HFA;VENTOLIN HFA) 108 (90 Base) MCG/ACT inhaler Inhale 2 puffs into the lungs every 6 (six) hours as needed for wheezing or shortness of breath.   Yes Historical Provider, MD  aspirin 81 MG chewable tablet Chew 81 mg by mouth daily.   Yes Historical Provider, MD  atorvastatin (LIPITOR) 80 MG tablet Take 80 mg by mouth daily.    Yes Historical Provider, MD  calcitRIOL (ROCALTROL) 0.5 MCG capsule Take 0.5 mcg by mouth daily.    Yes Historical Provider, MD  calcium acetate (PHOSLO) 667 MG capsule Take 2,001 mg by mouth 3 (three) times daily with meals.   Yes Historical Provider, MD  cinacalcet (SENSIPAR) 30 MG tablet Take 30 mg by mouth daily.   Yes Historical Provider, MD  docusate sodium (COLACE) 100 MG capsule Take 100 mg by mouth daily as needed for mild constipation.    Yes Historical Provider, MD  ondansetron (ZOFRAN-ODT) 4 MG disintegrating tablet Take 4 mg by mouth every 8 (eight) hours as needed for nausea or vomiting.    Yes Historical Provider, MD  pantoprazole (PROTONIX) 40 MG tablet Take 40 mg by mouth 2 (two) times daily.    Yes Historical Provider, MD  sodium chloride 1 g tablet Take 2 g by mouth 2 (two) times daily with a meal.   Yes Historical Provider, MD  sucralfate (CARAFATE) 1 GM/10ML suspension Take 10 mLs (1 g total) by mouth every 6 (six) hours. 02/12/16  Yes Houston Siren, MD  tiotropium (SPIRIVA) 18 MCG inhalation capsule Place 1 capsule (18 mcg total) into inhaler and inhale daily. 08/22/15  Yes Carollee Leitz, NP    Current Facility-Administered Medications  Medication Dose Route Frequency Provider Last Rate Last Dose  . 0.9 %  sodium chloride infusion  250 mL Intravenous PRN Corky Crafts, MD      . 0.9 %  sodium  chloride infusion  250 mL Intravenous PRN Corky Crafts, MD      . acetaminophen (TYLENOL) tablet 650 mg  650 mg Oral Q4H PRN Corky Crafts, MD      . acetaminophen (TYLENOL) tablet 650 mg  650 mg Oral Q4H PRN Corky Crafts, MD      . albuterol (PROVENTIL) (2.5 MG/3ML) 0.083% nebulizer solution 2.5 mg  2.5 mg Nebulization Q6H PRN Richarda Overlie, MD      . aspirin chewable tablet 81 mg  81 mg Oral Daily Ozella Rocks, MD   81 mg at 03/12/16 1114  . atorvastatin (LIPITOR) tablet 80 mg  80 mg Oral Daily Ozella Rocks, MD   80 mg at 03/12/16 1113  . calcitRIOL (ROCALTROL) capsule 0.5 mcg  0.5 mcg  Oral Daily Ozella Rocks, MD   0.5 mcg at 03/12/16 1113  . calcium acetate (PHOSLO) capsule 2,001 mg  2,001 mg Oral TID WC Ozella Rocks, MD   2,001 mg at 03/12/16 1610  . cinacalcet (SENSIPAR) tablet 30 mg  30 mg Oral Q breakfast Ozella Rocks, MD   30 mg at 03/12/16 0820  . Darbepoetin Alfa (ARANESP) injection 150 mcg  150 mcg Subcutaneous Q Wed-1800 Annie Sable, MD      . dialysis solution 1.5% low-MG/low-CA dianeal solution   Intraperitoneal Q24H Annie Sable, MD      . dialysis solution 1.5% low-MG/low-CA dianeal solution   Intraperitoneal 6 times per day Annie Sable, MD      . diltiazem (CARDIZEM) injection 5 mg  5 mg Intravenous Once PRN Ozella Rocks, MD      . docusate sodium (COLACE) capsule 100 mg  100 mg Oral Daily PRN Ozella Rocks, MD      . ferric gluconate (NULECIT) 500 mg in sodium chloride 0.9 % 100 mL IVPB  500 mg Intravenous Once Beryle Lathe, MD   500 mg at 03/12/16 1736  . gentamicin cream (GARAMYCIN) 0.1 % 1 application  1 application Topical Daily Annie Sable, MD   1 application at 03/12/16 1115  . heparin 1000 unit/ml injection 1,500 Units  1,500 Units Intraperitoneal PRN Annie Sable, MD      . heparin injection 5,000 Units  5,000 Units Subcutaneous 3 times per day Ozella Rocks, MD   5,000 Units at 03/12/16 0500    . heparin injection 5,000 Units  5,000 Units Subcutaneous 3 times per day Corky Crafts, MD      . multivitamin (RENA-VIT) tablet 1 tablet  1 tablet Oral QHS Beryle Lathe, MD      . ondansetron [facility]) injection 4 mg  4 mg Intravenous Q6H PRN Corky Crafts, MD      . ondansetron (ZOFRAN-ODT) disintegrating tablet 4 mg  4 mg Oral Q8H PRN Corky Crafts, MD      . pantoprazole (PROTONIX) EC tablet 40 mg  40 mg Oral BID Ozella Rocks, MD   40 mg at 03/12/16 1114  . sodium chloride flush (NS) 0.9 % injection 3 mL  3 mL Intravenous Q12H Ozella Rocks, MD   3 mL at 03/11/16 2122  . sodium chloride flush (NS) 0.9 % injection 3 mL  3 mL Intravenous Q12H Corky Crafts, MD      . sodium chloride flush (NS) 0.9 % injection 3 mL  3 mL Intravenous PRN Corky Crafts, MD      . sodium chloride flush (NS) 0.9 % injection 3 mL  3 mL Intravenous Q12H Corky Crafts, MD      . sodium chloride flush (NS) 0.9 % injection 3 mL  3 mL Intravenous PRN Corky Crafts, MD      . sodium chloride tablet 2 g  2 g Oral BID WC Ozella Rocks, MD   2 g at 03/12/16 1746  . tiotropium (SPIRIVA) inhalation capsule 18 mcg  18 mcg Inhalation Daily Ozella Rocks, MD   18 mcg at 03/12/16 1145    Allergies  Allergen Reactions  . Buprenorphine Nausea And Vomiting  . Ciprocinonide [Fluocinolone] Hives  . Demerol [Meperidine] Nausea And Vomiting  . Diflucan [Fluconazole] Nausea And Vomiting  . Flagyl [Metronidazole] Hives and Itching  . Hydrocodone Itching  . Levaquin [Levofloxacin] Other (See Comments)  Reaction: Numbness in legs   . Morphine And Related Nausea And Vomiting  . Tetracyclines & Related Itching and Swelling  . Valium [Diazepam] Nausea And Vomiting and Other (See Comments)    Reaction:  Hallucinations       Review of Systems:   General:  normal appetite, decreased energy, no weight gain, + weight loss, no fever  Cardiac:  no chest pain with exertion, no  chest pain at rest, +SOB with exertion, + resting SOB, no PND, no orthopnea, no palpitations, + arrhythmia, + atrial fibrillation, no LE edema, + dizzy spells, + near syncope  Respiratory:  + shortness of breath, no home oxygen, no productive cough, no dry cough, no bronchitis, no wheezing, no hemoptysis, no asthma, no pain with inspiration or cough, no sleep apnea, no CPAP at night  GI:   no difficulty swallowing, no reflux, no frequent heartburn, no hiatal hernia, no abdominal pain, no constipation, no diarrhea, + hematochezia, no hematemesis, + melena  GU:   Patient still makes urine daily, no dysuria,  no frequency, no urinary tract infection, no hematuria, no kidney stones, + chronic kidney disease stage V  Vascular:  no pain suggestive of claudication, no pain in feet, no leg cramps, no varicose veins, + DVT, no non-healing foot ulcer  Neuro:   + stroke, + TIA's, no seizures, no headaches, no temporary blindness one eye,  + slurred speech, no peripheral neuropathy, no chronic pain, + instability of gait, no memory/cognitive dysfunction  Musculoskeletal: no arthritis, no joint swelling, no myalgias, + severe difficulty walking, extremely limited mobility - wheelchair-bound  Skin:   no rash, no itching, no skin infections, + pressure sores or ulcerations  Psych:   no anxiety, no depression, no nervousness, no unusual recent stress  Eyes:   + blurry vision, no floaters, no recent vision changes, does not wear glasses or contacts  ENT:   no hearing loss, no loose or painful teeth, no dentures, last saw dentist many years ago - poor dentition  Hematologic:  no easy bruising, + abnormal bleeding, no clotting disorder, no frequent epistaxis  Endocrine:  + diabetes, does not check CBG's at home     Physical Exam:   BP 132/93 mmHg  Pulse 63  Temp(Src) 98.7 F (37.1 C) (Oral)  Resp 23  Ht 5\' 10"  (1.778 m)  Wt 115 lb 1.3 oz (52.2 kg)  BMI 16.51 kg/m2  SpO2 99%  General:  Thin, cachectic and very  weak-appearing  HEENT:  Unremarkable except for poor dentition  Neck:   no JVD, no bruits, no adenopathy   Chest:   clear to auscultation, symmetrical breath sounds, no wheezes, no rhonchi   CV:   RRR, grade III/VI holosystolic murmur   Abdomen:  soft, non-tender, no masses   Extremities:  warm, well-perfused, pulses not palpable, no lower extremity edema  Rectal/GU  Deferred  Neuro:   Grossly non-focal and symmetrical throughout  Skin:   Clean and dry, no rashes, no breakdown  Diagnostic Tests:  Transthoracic Echocardiography  Patient: Dorothy Long, Dorothy Long MR #: [id number] Study Date: 11/18/2015 Gender: F Age: 40 Height: 175.3 cm Weight: 49.9 kg BSA: 1.54 m^2 Pt. Status: Room: 234A  ATTENDING Gale Journey ADMITTING Princess Perna J SONOGRAPHER Josepha Pigg Stills RDCS PERFORMING [facility]  cc:  ------------------------------------------------------------------- LV EF: 40% - 45%  ------------------------------------------------------------------- Indications: 434.91 CVA.  ------------------------------------------------------------------- Study Conclusions  - Left ventricle: The cavity size was normal. There was mild  hypertrophy of  the septal wall. Systolic function was mildly to  moderately reduced. The estimated ejection fraction was in the  range of 40% to 45%. Diffuse hypokinesis. Features are consistent  with a pseudonormal left ventricular filling pattern, with  concomitant abnormal relaxation and increased filling pressure  (grade 2 diastolic dysfunction). Doppler parameters are  consistent with high ventricular filling pressure. - Aortic valve: Moderately calcified annulus. Trileaflet; normal  thickness leaflets. There was mild regurgitation. Valve area  (Vmax): 1.62 cm^2. Regurgitation pressure half-time: 735 ms. - Mitral valve: Mobility of the  anterior and posterior leaflet was  severely restricted. The findings are consistent with moderate  stenosis by calculations. Visually, stenosis appears severe.  There was moderate regurgitation. Pressure half-time: 142 ms.  Mean gradient (D): 13 mm Hg. Valve area by pressure half-time:  1.55 cm^2. - Left atrium: The atrium was severely dilated. - Right ventricle: The cavity size was normal. Wall thickness was  normal. Systolic function was normal. - Right atrium: The atrium was severely dilated. - Tricuspid valve: There was mild regurgitation. - Pulmonic valve: There was mild regurgitation. - Inferior vena cava: The vessel was dilated. The respirophasic  diameter changes were blunted (< 50%), consistent with elevated  central venous pressure.  Impressions:  - Severely thickened, severely calcified leaflets, especially  prominent in the anterior leaflet. Likely consistent with  Rheumatic heart disease. Mitral valve flow only through what is  likely the A1/P1 segment of the valve. Stenosis appears severe  visually but calculations suggest moderate stenosis. Suggest TEE  for further evaluation.  Transthoracic echocardiography. M-mode, complete 2D, spectral Doppler, and color Doppler. Birthdate: Patient birthdate: 12-14-1961. Age: Patient is 55 yr old. Sex: Gender: female. BMI: 16.2 kg/m^2. Blood pressure: 103/79 Patient status: Inpatient. Study date: Study date: 11/18/2015. Study time: 10:01 AM.  -------------------------------------------------------------------  ------------------------------------------------------------------- Left ventricle: The cavity size was normal. There was mild hypertrophy of the septal wall. Systolic function was mildly to moderately reduced. The estimated ejection fraction was in the range of 40% to 45%. Diffuse hypokinesis. Features are consistent with a pseudonormal left ventricular filling pattern,  with concomitant abnormal relaxation and increased filling pressure (grade 2 diastolic dysfunction). Doppler parameters are consistent with high ventricular filling pressure.  ------------------------------------------------------------------- Aortic valve: Moderately calcified annulus. Trileaflet; normal thickness leaflets. Mobility was not restricted. Doppler: Transvalvular velocity was within the normal range. There was no stenosis. There was mild regurgitation. Peak velocity ratio of LVOT to aortic valve: 0.64. Valve area (Vmax): 1.62 cm^2. Indexed valve area (Vmax): 1.05 cm^2/m^2. Peak gradient (S): 10 mm Hg.  ------------------------------------------------------------------- Aorta: Aortic root: The aortic root was normal in size.  ------------------------------------------------------------------- Mitral valve: Severely thickened, severely calcified leaflets, especially prominent in the anterior leaflet. Likely consistent with Rheumatic heart disease. Mobility of the anterior and posterior leaflet was severely restricted. Doppler: The findings are consistent with moderate stenosis by calculations. Visually, stenosis appears severe. There was moderate regurgitation. Valve area by pressure half-time: 1.55 cm^2. Indexed valve area by pressure half-time: 1.01 cm^2/m^2. Mean gradient (D): 13 mm Hg. Peak gradient (D): 20 mm Hg.  ------------------------------------------------------------------- Left atrium: The atrium was severely dilated.  ------------------------------------------------------------------- Right ventricle: The cavity size was normal. Wall thickness was normal. Systolic function was normal.  ------------------------------------------------------------------- Pulmonic valve: The valve appears to be grossly normal. Doppler: Transvalvular velocity was within the normal range. There was no evidence for stenosis. There was mild  regurgitation.  ------------------------------------------------------------------- Tricuspid valve: Structurally normal valve. Doppler: Transvalvular velocity was within the normal range. There was mild regurgitation.  -------------------------------------------------------------------  Pulmonary artery: The main pulmonary artery was dilated. Systolic pressure was within the normal range. Main pulmonary artery: Diameter: 27 mm (ED).  ------------------------------------------------------------------- Right atrium: The atrium was severely dilated.  ------------------------------------------------------------------- Pericardium: There was no pericardial effusion.  ------------------------------------------------------------------- Systemic veins: Inferior vena cava: The vessel was dilated. The respirophasic diameter changes were blunted (< 50%), consistent with elevated central venous pressure.  ------------------------------------------------------------------- Measurements  Main pulmonary artery Value Reference ID, ED 27 mm ---------  Left ventricle Value Reference LV ID, ED, PLAX chordal 45.7 mm 43 - 52 LV ID, ES, PLAX chordal 37.6 mm 23 - 38 LV fx shortening, PLAX chordal (L) 18 % >=29 LV PW thickness, ED 8.27 mm --------- IVS/LV PW ratio, ED (H) 1.31 <=1.3 LV ejection fraction, 1-p A4C 46 % --------- LV e&', lateral 5.11 cm/s --------- LV E/e&', lateral 43.44 --------- LV e&', medial 4.9 cm/s --------- LV E/e&', medial 45.31 --------- LV e&',  average 5.01 cm/s --------- LV E/e&', average 44.36 ---------  Ventricular septum Value Reference IVS thickness, ED 10.8 mm ---------  LVOT Value Reference LVOT ID, S 18 mm --------- LVOT area 2.54 cm^2 --------- LVOT peak velocity, S 102 cm/s ---------  Aortic valve Value Reference Aortic valve peak velocity, S 160 cm/s --------- Aortic peak gradient, S 10 mm Hg --------- Velocity ratio, peak, LVOT/AV 0.64 --------- Aortic valve area, peak velocity 1.62 cm^2 --------- Aortic valve area/bsa, peak 1.05 cm^2/m^2 --------- velocity Aortic regurg pressure half-time 735 ms ---------  Aorta Value Reference Aortic root ID, ED 27 mm --------- Ascending aorta ID, A-P, S 28 mm ---------  Left atrium Value Reference LA ID, A-P, ES 47 mm --------- LA ID/bsa, A-P (H) 3.05 cm/m^2 <=2.2 LA volume, ES, 1-p A4C 117 ml --------- LA volume/bsa, ES, 1-p A4C 76 ml/m^2 ---------  Mitral valve Value Reference Mitral E-wave peak velocity 222 cm/s --------- Mitral A-wave peak velocity 153 cm/s --------- Mitral mean velocity, D 172 cm/s --------- Mitral deceleration time (H) 343 ms 150  - 230 Mitral pressure half-time 142 ms --------- Mitral mean gradient, D 13 mm Hg --------- Mitral peak gradient, D 20 mm Hg --------- Mitral E/A ratio, peak 1.5 --------- Mitral valve area, PHT, DP 1.55 cm^2 --------- Mitral valve area/bsa, PHT, DP 1.01 cm^2/m^2 --------- Mitral annulus VTI, D 94 cm ---------  Tricuspid valve Value Reference Tricuspid maximal inflow 288 cm/s --------- velocity, PISA  Pulmonic valve Value Reference Pulmonic valve peak velocity, S 69.2 cm/s --------- Pulmonic regurg velocity, ED 168 cm/s --------- Pulmonic regurg gradient, ED 11 mm Hg ---------  Legend: (L) and (H) mark values outside specified reference range.  ------------------------------------------------------------------- Prepared and Electronically Authenticated by  Chilton Si, MD 2016-12-04T15:02:03    Transthoracic Echocardiography  Patient: Dorothy Long, Dorothy Long MR #: [id number] Study Date: 02/09/2016 Gender: F Age: 76 Height: 177.8 cm Weight: 57.7 kg BSA: 1.68 m^2 Pt. Status: Room: 125A  Becky Sax ADMITTING Sudini, Srikar R PERFORMING Arnoldo Hooker, MD SONOGRAPHER Morton Stall ATTENDING Sharma Covert, Anne-Caroline  cc:  ------------------------------------------------------------------- LV EF: 55% - 60%  ------------------------------------------------------------------- Indications: Dyspnea.  ------------------------------------------------------------------- Study Conclusions  - Left ventricle: The cavity size was normal. Systolic  function was  normal. The estimated ejection fraction was in the range of 55%  to 60%. - Aortic valve: There was mild regurgitation. Valve area (Vmax):  1.32 cm^2. - Mitral valve: Calcified annulus. Mobility was restricted. Cannot  exclude vegetation. There was moderate regurgitation directed  eccentrically. Valve area by continuity equation (using LVOT  flow): 0.5 cm^2. - Left atrium: The  atrium was moderately dilated. - Right atrium: The atrium was mildly to moderately dilated. - Tricuspid valve: There was moderate regurgitation. - Pulmonary arteries: PA peak pressure: 49 mm Hg (S).  Transthoracic echocardiography. M-mode, complete 2D, spectral Doppler, and color Doppler. Birthdate: Patient birthdate: 10-Apr-1961. Age: Patient is 55 yr old. Sex: Gender: female. BMI: 18.3 kg/m^2. Blood pressure: 104/75 Patient status: Inpatient. Study date: Study date: 02/09/2016. Study time: 08:03 AM.  -------------------------------------------------------------------  ------------------------------------------------------------------- Left ventricle: The cavity size was normal. Systolic function was normal. The estimated ejection fraction was in the range of 55% to 60%.  ------------------------------------------------------------------- Aortic valve: Mildly thickened, mildly calcified leaflets. Doppler: There was mild regurgitation. Peak velocity ratio of LVOT to aortic valve: 0.52. Valve area (Vmax): 1.32 cm^2. Indexed valve area (Vmax): 0.79 cm^2/m^2. Peak gradient (S): 9 mm Hg.  ------------------------------------------------------------------- Aorta: The aorta was normal, not dilated, and non-diseased.  ------------------------------------------------------------------- Mitral valve: Calcified annulus. Mobility was restricted. Cannot exclude vegetation. Doppler: There was moderate regurgitation directed eccentrically. Valve area by  continuity equation (using LVOT flow): 0.5 cm^2. Indexed valve area by continuity equation (using LVOT flow): 0.3 cm^2/m^2. Mean gradient (D): 13 mm Hg. Peak gradient (D): 18 mm Hg.  ------------------------------------------------------------------- Left atrium: The atrium was moderately dilated.  ------------------------------------------------------------------- Right ventricle: The cavity size was normal. Wall thickness was normal. Systolic function was normal.  ------------------------------------------------------------------- Tricuspid valve: Doppler: There was moderate regurgitation.  ------------------------------------------------------------------- Right atrium: The atrium was mildly to moderately dilated.  ------------------------------------------------------------------- Post procedure conclusions Ascending Aorta:  - The aorta was normal, not dilated, and non-diseased.  ------------------------------------------------------------------- Measurements  Left ventricle Value Reference LV ID, ED, PLAX chordal 44.2 mm 43 - 52 LV ID, ES, PLAX chordal 32.4 mm 23 - 38 LV fx shortening, PLAX chordal (L) 27 % >=29 LV PW thickness, ED 11 mm --------- IVS/LV PW ratio, ED 0.86 <=1.3 Stroke volume, 2D 32 ml --------- Stroke volume/bsa, 2D 19 ml/m^2 --------- LV e&', lateral 2.94 cm/s --------- LV E/e&', lateral 71.77 --------- LV e&', medial 4.57 cm/s --------- LV E/e&', medial 46.17 --------- LV e&', average 3.76 cm/s --------- LV E/e&',  average 56.19 ---------  Ventricular septum Value Reference IVS thickness, ED 9.47 mm ---------  LVOT Value Reference LVOT ID, S 18 mm --------- LVOT area 2.54 cm^2 --------- LVOT peak velocity, S 77.4 cm/s --------- LVOT mean velocity, S 55.4 cm/s --------- LVOT VTI, S 12.5 cm ---------  Aortic valve Value Reference Aortic valve peak velocity, S 149 cm/s --------- Aortic peak gradient, S 9 mm Hg --------- Velocity ratio, peak, LVOT/AV 0.52 --------- Aortic valve area, peak velocity 1.32 cm^2 --------- Aortic valve area/bsa, peak 0.79 cm^2/m^2 --------- velocity Aortic regurg pressure half-time 565 ms ---------  Aorta Value Reference Aortic root ID, ED 25 mm ---------  Left atrium Value Reference LA ID, A-P, ES 41 mm --------- LA ID/bsa, A-P (H) 2.45 cm/m^2 <=2.2 LA volume, ES, 1-p A4C 86.3 ml --------- LA volume/bsa, ES, 1-p A4C 51.5 ml/m^2 --------- LA volume, ES, 1-p A2C 78.2 ml --------- LA volume/bsa, ES, 1-p A2C 46.7 ml/m^2 ---------  Mitral valve Value Reference Mitral E-wave peak velocity 211 cm/s --------- Mitral A-wave peak velocity 196 cm/s  --------- Mitral mean velocity, D 177 cm/s --------- Mitral mean gradient, D 13 mm Hg --------- Mitral peak gradient, D 18 mm Hg --------- Mitral E/A ratio, peak 1.1 --------- Mitral valve area, LVOT 0.5 cm^2 --------- continuity Mitral valve area/bsa, LVOT 0.3 cm^2/m^2 --------- continuity Mitral annulus VTI, D 63.5 cm ---------  Pulmonary arteries Value Reference PA pressure,  S, DP (H) 49 mm Hg <=30  Tricuspid valve Value Reference Tricuspid regurg peak velocity 314 cm/s --------- Tricuspid peak RV-RA gradient 39 mm Hg ---------  Right ventricle Value Reference RV pressure, S, DP (H) 49 mm Hg <=30  Pulmonic valve Value Reference Pulmonic valve peak velocity, S 71.9 cm/s --------- Pulmonic regurg velocity, ED 178 cm/s --------- Pulmonic regurg gradient, ED 13 mm Hg ---------  Legend: (L) and (H) mark values outside specified reference range.  ------------------------------------------------------------------- Prepared and Electronically Authenticated by  Arnoldo Hooker, MD 2017-02-25T12:11:09    Transesophageal Echocardiography  Patient: Dorothy Long, Dorothy Long MR #: [id number] Study Date: 03/07/2016 Gender: F Age: 53 Height: 177.8 cm Weight: 47.2 kg BSA: 1.5 m^2 Pt. Status: Room: 252A  ORDERING Lorine Bears, MD ADMITTING Kristeen Miss ATTENDING Altamese Dilling SONOGRAPHER Cristela Blue RDCS PERFORMING [facility],  [facility]  cc:  ------------------------------------------------------------------- LV EF: 45% - 50%  ------------------------------------------------------------------- Indications: Mitral valve disorder 424.0.  ------------------------------------------------------------------- Study Conclusions  - Left ventricle: Systolic function was mildly reduced. The  estimated ejection fraction was in the range of 45% to 50%. Wall  motion was normal; there were no regional wall motion  abnormalities. - Aortic valve: There was mild regurgitation. - Mitral valve: The findings are consistent with severe stenosis.  There was moderate regurgitation directed eccentrically. Mean  gradient (D): 10 mm Hg. - Left atrium: The atrium was severely dilated. - Right ventricle: The cavity size was normal. Wall thickness was  mildly increased. Systolic function was mildly reduced. - Right atrium: The atrium was moderately to severely dilated. - Tricuspid valve: There was mild-moderate regurgitation.  Diagnostic transesophageal echocardiography. 2D and color Doppler. Birthdate: Patient birthdate: 1961-06-18. Age: Patient is 55 yr old. Sex: Gender: female. BMI: 14.9 kg/m^2. Study date: Study date: 03/07/2016. Study time: 08:13 AM.  -------------------------------------------------------------------  ------------------------------------------------------------------- Left ventricle: Systolic function was mildly reduced. The estimated ejection fraction was in the range of 45% to 50%. Wall motion was normal; there were no regional wall motion abnormalities.  ------------------------------------------------------------------- Aortic valve: Trileaflet; normal thickness, mildly calcified leaflets. Cusp separation was normal. Doppler: There was mild regurgitation.  ------------------------------------------------------------------- Aorta: There was no atheroma. There was  no evidence for dissection. Aortic root: The aortic root was not dilated. Ascending aorta: The ascending aorta was normal in size. Aortic arch: The aortic arch was normal in size. Descending aorta: The descending aorta was normal in size.  ------------------------------------------------------------------- Mitral valve: Severely thickened, severely calcified leaflets . There was a heavily calcified mass present. This could be an old vegetation. Doppler: The findings are consistent with severe stenosis. There was moderate regurgitation directed eccentrically. Mean gradient (D): 10 mm Hg.  ------------------------------------------------------------------- Left atrium: The atrium was severely dilated. No evidence of thrombus in the atrial cavity or appendage. The appendage was morphologically a left appendage, multilobulated, and of normal size. Emptying velocity was normal.  ------------------------------------------------------------------- Right ventricle: The cavity size was normal. Wall thickness was mildly increased. Systolic function was mildly reduced.  ------------------------------------------------------------------- Pulmonic valve: Structurally normal valve.  ------------------------------------------------------------------- Tricuspid valve: Structurally normal valve. Leaflet separation was normal. Doppler: There was mild-moderate regurgitation.  ------------------------------------------------------------------- Pulmonary artery: The main pulmonary artery was normal-sized. Systolic pressure could not be accurately estimated.  ------------------------------------------------------------------- Right atrium: The atrium was moderately to severely dilated. No evidence of thrombus in the atrial cavity or appendage. The appendage was morphologically a right  appendage.  ------------------------------------------------------------------- Pericardium: There was no pericardial effusion.  ------------------------------------------------------------------- Measurements  Mitral valve Value Mitral mean velocity, D 144 cm/s Mitral mean gradient,  D 10 mm Hg Mitral annulus VTI, D 52.2 cm  Legend: (L) and (H) mark values outside specified reference range.  ------------------------------------------------------------------- Prepared and Electronically Authenticated by  Lorine Bears, MD 2017-03-24T12:47:17    CARDIAC CATHETERIZATION Procedures    Right/Left Heart Cath and Coronary Angiography    Conclusion     Mid LAD lesion, 75% stenosed.  Mid RCA-1 lesion, 99% stenosed with left to right collaterals.  Overall normal left ventricular function. 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  Severe mitral stenosis. Using LV-PCWP tracing, mean gradient 15 mm Hg. MV area 1.1 cm 2  Moderate pulmonary hypertension. PA sat 59%. CO 5.3 L/min. CI 3.2.  IVC filter in place.  Continue with plans for CT surgery evaluation. For MVR and now CABG.      Indications    Mitral valve stenosis, unspecified etiology [I05.0 (ICD-10-CM)]    Technique and Indications    The risks, benefits, and details of the procedure were explained to the patient. The patient verbalized understanding and wanted to proceed. Informed written consent was obtained.  PROCEDURE TECHNIQUE: After Xylocaine anesthesia, a 7 French sheath was placed in the right common femoral vein. After Xylocaine anesthesia, a 73F sheath was placed in the right femoral artery with a single anterior needle wall stick. Left coronary angiography was done using a Judkins L4 guide catheter. Right coronary angiography was done using a Judkins R4 guide catheter. Left heart cath was done using a pigtail catheter.  A 7 [place] balloontipped  Swan-Ganz catheter was advanced to the pulmonary artery under fluoroscopic guidance, over a wire through the IVC filter. Hemodynamic pressures were obtained. Oxygen saturations were obtained. Removal of the Swan catheter was done under fluoroscopic visualization. The IVC filter did not move.    Contrast: 60 cc  During this procedure the patient is administered a total of Versed 1 mg and Fentanyl 25 mg to achieve and maintain moderate conscious sedation. The patient's heart rate, blood pressure, and oxygen saturation are monitored continuously during the procedure. The period of conscious sedation is 44 minutes, of which I was present face-to-face 100% of this time. Estimated blood loss <50 mL. There were no immediate complications during the procedure.    Coronary Findings    Dominance: Right   Left Anterior Descending   . Mid LAD lesion, 75% stenosed. The lesion is type C Moderately Calcified located at the major branch.     Left Circumflex  The vessel exhibits minimal luminal irregularities.     Right Coronary Artery   . Mid RCA-1 lesion, 99% stenosed.   . Mid RCA-2 lesion, 50% stenosed.   . Right Posterior Descending Artery   RPDA filled by collaterals from Dist LAD.       Right Heart Pressures Hemodynamic findings consistent with moderate pulmonary hypertension. LV EDP is normal. There is an IVC filter in place.    Wall Motion                 Coronary Diagrams    Diagnostic Diagram            Implants     No implant documentation for this case.    PACS Images    Show images for Cardiac catheterization     Link to Procedure Log    Procedure Log      Hemo Data       Most Recent Value   Fick Cardiac Output  5.32 L/min   Fick Cardiac Output Index  3.26 (L/min)/BSA  Mitral Mean Gradient  15 mmHg   Mitral Peak Gradient  10 mmHg   Mitral Valve Area Index  0.71 cm2/BSA   RA A Wave  10 mmHg   RA V Wave  10 mmHg   RA Mean  8 mmHg    RV Systolic Pressure  50 mmHg   RV Diastolic Pressure  7 mmHg   RV EDP  12 mmHg   PA Systolic Pressure  50 mmHg   PA Diastolic Pressure  28 mmHg   PA Mean  36 mmHg   PW A Wave  16 mmHg   PW V Wave  25 mmHg   PW Mean  18 mmHg   AO Systolic Pressure  124 mmHg   AO Diastolic Pressure  82 mmHg   AO Mean  98 mmHg   LV Systolic Pressure  112 mmHg   LV Diastolic Pressure  1 mmHg   LV EDP  8 mmHg   Arterial Occlusion Pressure Extended Systolic Pressure  121 mmHg   Arterial Occlusion Pressure Extended Diastolic Pressure  76 mmHg   Arterial Occlusion Pressure Extended Mean Pressure  95 mmHg   Left Ventricular Apex Extended Systolic Pressure  115 mmHg   Left Ventricular Apex Extended Diastolic Pressure  4 mmHg   Left Ventricular Apex Extended EDP Pressure  11 mmHg   QP/QS  1   TPVR Index  11.95 HRUI   TSVR Index  30.04 HRUI   PVR SVR Ratio  0.2   TPVR/TSVR Ratio  0.4     CHEST 2 VIEW  COMPARISON: Portable chest x-ray of March 10, 2016  FINDINGS: The left lung is mildly hyperinflated and clear. On the right there is mild volume loss due to a moderate-sized pleural effusion. There is right basilar atelectasis or pneumonia. The cardiac silhouette remains enlarged. The pulmonary vascularity is mildly engorged. The mediastinum is normal in width. The bony thorax exhibits no acute abnormality.  IMPRESSION: COPD. Moderate-sized right pleural effusion and right basilar atelectasis or pneumonia. CHF with mild pulmonary interstitial edema.   Electronically Signed  By: David Swaziland M.D.  On: 03/12/2016 07:50    Impression:  I have personally reviewed the patient's recent transthoracic and transesophageal echocardiograms, diagnostic cardiac catheterization, CT scans and plain film x-rays, and extensive medical records.  She has likely underlying rheumatic heart disease with stage D severe symptomatic mitral stenosis and mitral regurgitation.   Echocardiograms demonstrate extremely heavy calcification involving the mitral valve with severe mitral stenosis and severe mitral regurgitation. The appearance of the valve is somewhat unusual including bulky lobular calcification but it remains most consistent with underlying rheumatic pathology.  The calcification extends into the mitral annulus and subvalvular apparatus. There is mild left ventricular systolic dysfunction, severe left and right atrial enlargement, and mild central aortic insufficiency.  There is at least moderate (2+) tricuspid regurgitation. The patient has significant coronary artery disease with 99% subtotal occlusion of the mid right coronary artery. The distal right coronary artery fills both antegrade and retrograde via left to right collaterals. There is tubular stenosis of the proximal left anterior descending coronary artery. I am not convinced that the LAD stenosis is flow limiting and would suggest flow wire analysis if intervention were to be planned.  Catheterization confirmed the presence of severe mitral stenosis and moderate pulmonary hypertension.  The patient presents a long history of symptoms consistent with chronic combined systolic and diastolic congestive heart failure and recurrent paroxysmal atrial fibrillation.  Her most recent history began in early  December when she presented with a near syncopal episode and was felt to have suffered a TIA. Further cardiac workup was recommended but the patient declined. On 12/11/2015 the patient suffered a large embolic stroke manifest as severe right-sided hemiparesis, aphasia, and dysarthria. She also suffered an acute non-ST segment elevation myocardial infarction during that admission. Cardiac workup was deferred at that time.  Although she made some progress through rehabilitation, she has never fully recovered from her stroke and she remains non-ambulatory.  She has had 2 massive GI bleeds and been taken off of all  anticoagulation up until recently (the patient is currently on subcutaneous heparin and low-dose aspirin).  Her most recent hospitalization has been primarily related to acute exacerbation of chronic shortness of breath consistent with acute exacerbation of chronic combined systolic and diastolic congestive heart failure and a large right pleural effusion. Symptoms improved with thoracentesis but the patient still has a moderate to large sized pleural effusion that appears to be re-accumulating. She is severely malnourished and extremely weak.    I do not feel that the patient should be considered a candidate for high-risk surgical intervention at this time. Optimal surgical treatment would require mitral valve replacement and coronary artery bypass grafting at the very minimum. She would likely require tricuspid valve repair and she would unquestionably benefit from a Maze procedure.  At this point in time I feel that the risks associated with surgery would be unacceptably high and I recommend palliative interventions only at least for the immediate future. If the patient shows signs of significant recovery from her recent stroke, has no further problems with recurrent GI bleeding, and can regain some strength and improve nutritional status, I would be willing to reevaluate her again in the future. However, she would have to show dramatic improvement.  She has very poor dentition but she appears to weak and frail to even consider dental extraction at this time. Overall her prognosis is quite poor.    Plan:  I have discussed the complex nature of the patient's multiple medical problems at length with the patient, her husband, and her sister at the bedside. We discussed why I feel that the patient should not be considered a candidate for definitive cardiac surgical intervention at this time. Options for palliation in the short-term have been discussed. The indications, risks, potential benefits of Pleurx  catheter placement have been reviewed. We will plan to proceed with right Pleurx catheter placement in the operating room tomorrow. Hopefully this will facilitate improved control of the patient's chronic shortness of breath and allow her to demonstrate further improvement. She should be evaluated by the nutritionist and be given protein calorie supplementation aggressively. Judicious management of her volume status through dialysis will be mandatory if she is to show signs of improvement.  I do not favor interventional treatment for the patient's underlying coronary artery disease at this time but PCI and stenting of the right coronary artery could be considered if the patient were to develop symptoms or signs of coronary ischemia.   I spent in excess of 120 minutes during the conduct of this hospital consultation and >50% of this time involved direct face-to-face encounter for counseling and/or coordination of the patient's care.   Salvatore Decent. Cornelius Moras, MD 03/12/2016 5:48 PM

## 2016-03-12 NOTE — Progress Notes (Signed)
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 Cherylann Ratel in [place]. She also has COPD, cirrhosis, stroke with right-sided weakness fairly recently, thrombocytopenia, depression as well as a history of severe mitral stenosis. She's had multiple hospitalizations over the last couple of months consisting of a GI bleed from severe esophagitis as well as worsening volume overload with pleural effusion requiring thoracentesis. She was transferred here today from [facility] for consideration of a mitral valve replacement/repair. The history is obtained mostly from the chart  Assessment and plan  Mitral Valve Stenosis: Per evaluation at [facility] and discussions with cardiothoracic surgery, patient is in need of valvular repair. Cardiothoracic surgery has been consult and will evaluate patient. Requesting a catheterization prior to surgery which will be done today .   TEE echo performed on 03/07/2016 shows EF of 45% and severe mitral valve stenosis - f/u CTS and Cardiology recs - NPO     ESRD on peritoneal dialysis: Followed by  Dr. Benson Norway of [facility] in [place]. Has fistula present but does not want hemodialysis.   Nephrology following  - f/u Nephrology recs - peritoneal dialysis per nephrology - continue renal vitamins - CMET, mag, phos   Pleural effusion: transudate per evaluation at [facility]. Likely from complications from cardiac valvular dysfunction. Currently markedly improved from thoracentesis.  - consider Plurex if recurs right thoracentesis yielding 1.3 L of pleural fluid.on 3/23, Likely transudative Attempted repeat thoracentesis again   COPD: at baseline - continue spiriva and albuterol PRN  PHysical deconditioning: - PT/OT - nutrition consult - prealbumin  Anemia: Hgb 8.5. Baseline. Likely from ESRD - consider Epo - defer to nephro - CBC in am  PAF: currently rate controlled Currently sinus rhythm -Anticoagulation when able. Currently on IV heparin. -Surgery performed, Maze procedure - Dilt IV prn  H/o CVA:  - PT/OT  GERD: - contininue GERD  HLD: - continue lipitor   DVT prophylaxsis Heparin  Code Status:      Code Status Orders        Start     Ordered   03/11/16 1824  Full code   Continuous     03/11/16 1826     Family Communication: Discussed in detail with the patient, all imaging results, lab results explained to the patient   Disposition Plan:  Cath today     Consultants:  Cardiology  CTS  nephrology  Procedures:  PD  Antibiotics: Anti-infectives    None         HPI/Subjective:    Objective: Filed Vitals:   03/11/16 2342 03/12/16 0458 03/12/16 0551 03/12/16 0756  BP: 127/91 116/81  131/96  Pulse: 78 83  90  Temp: 98.6 F (37 C) 98.2 F (36.8 C)  98.7 F (37.1 C)  TempSrc:  Oral  Oral  Resp: 16 16  16   Height:      Weight:   52.2 kg (115 lb 1.3 oz)   SpO2: 97% 96%  96%    Intake/Output Summary (Last 24 hours) at 03/12/16 1228 Last data filed at 03/12/16 1000  Gross per 24 hour  Intake   4800 ml  Output   3450 ml  Net   1350  ml    Exam:  General: No acute respiratory distress Lungs: Clear to auscultation bilaterally without wheezes or crackles Cardiovascular: Regular rate and rhythm without murmur gallop or rub normal S1 and S2 Abdomen: Nontender, nondistended, soft, bowel sounds positive, no rebound, no ascites, no appreciable mass Extremities: No significant cyanosis, clubbing, or edema bilateral lower extremities     Data Review   Micro Results Recent Results (from the past 240 hour(s))  Culture, body fluid-bottle     Status: None   Collection Time: 03/06/16  9:35 AM  Result Value Ref Range Status   Specimen Description PLEURAL  Final   Special Requests NONE  Final   Gram Stain NO ORGANISMS SEEN  Final   Culture NO GROWTH 4 DAYS  Final   Report Status 03/10/2016 FINAL  Final    Radiology Reports Dg Chest 1 View  03/06/2016  CLINICAL DATA:  Post right thoracentesis EXAM: CHEST 1 VIEW COMPARISON:  03/04/2016 FINDINGS: Marked cardiomegaly with central vascular congestion. Resolution of the right effusion following thoracentesis. No pneumothorax. Minor bibasilar atelectasis. Heavy calcification noted of the mitral valve. Trachea is midline. IMPRESSION: Negative for pneumothorax following thoracentesis. Cardiomegaly without CHF Extensive mitral valve calcifications. Electronically Signed   By: Judie Petit.  Shick M.D.   On: 03/06/2016 11:13   Dg Chest 2 View  03/12/2016  CLINICAL DATA:  Shortness of breath, history of CHF, mitral valve disease, Celsius OPD, dialysis dependent renal failure. EXAM: CHEST  2 VIEW COMPARISON:  Portable chest x-ray of March 10, 2016 FINDINGS: The left lung is mildly hyperinflated and clear. On the right there is mild volume loss due to a moderate-sized pleural effusion. There is right basilar atelectasis or pneumonia. The cardiac silhouette remains enlarged. The pulmonary vascularity is mildly engorged. The mediastinum is normal in width. The bony thorax exhibits no acute abnormality.  IMPRESSION: COPD. Moderate-sized right pleural effusion and right basilar atelectasis or pneumonia. CHF with mild pulmonary interstitial edema. Electronically Signed   By: David  [place] M.D.   On: 03/12/2016 07:50   Dg Chest 2 View  03/04/2016  CLINICAL DATA:  Right lower leg pain and swelling for 5 days. Right-sided chest pain starting yesterday. EXAM: CHEST  2 VIEW COMPARISON:  12/18/2015 FINDINGS: Cardiac enlargement with mild central pulmonary vascular congestion suggesting arterial hypertension. Calcification projected over the heart corresponding calcification in the mitral valve annulus on prior CT. Mediastinal contours appear intact. There is increasing right pleural effusion with basilar atelectasis or consolidation in the right lung since the previous study. No pneumothorax. Vena caval filter and surgical clips demonstrated in the right upper quadrant. IMPRESSION: Cardiac enlargement with central pulmonary vascular congestion. Increasing right pleural effusion with basilar infiltration or atelectasis. Electronically Signed   By: Burman Nieves M.D.   On: 03/04/2016 19:40   Ct Chest Wo Contrast  03/05/2016  CLINICAL DATA:  End-stage renal disease with right pleural effusion and chronic congestive heart failure. EXAM: CT CHEST WITHOUT  CONTRAST TECHNIQUE: Multidetector CT imaging of the chest was performed following the standard protocol without IV contrast. COMPARISON:  Chest CT February 08, 2016; chest radiograph March 04, 2016 FINDINGS: Mediastinum/Lymph Nodes: Thyroid appears unremarkable. There are subcentimeter mediastinal lymph nodes but no adenopathy by size criteria. There is no appreciable thoracic aortic aneurysm. Visualized great vessels appear unremarkable. There is generalized cardiomegaly with primarily right heart enlargement. There is a small pericardial effusion. There are multiple foci of coronary artery calcification. There is extensive mitral valve calcification. There is  prominence of the central pulmonary arteries with rapid peripheral tapering. Lungs/Pleura: There remains a sizable right pleural effusion with atelectatic change in the right lower lobe. There is scarring in the lung bases. There is a rather minimal left pleural effusion with mild left base atelectasis. The somewhat nodular appearing opacity on axial images in the medial left base on prior study appears consistent with localized atelectasis at this time. Upper abdomen: In the visualized upper abdomen, kidneys are atrophic. Liver has a somewhat nodular contour, concerning for a degree of underlying cirrhosis. There is a filter in the inferior vena cava. There is calcification in the visualized abdominal aorta. Gallbladder is absent. Musculoskeletal: Bones are somewhat sclerotic consistent with chronic renal failure. No lytic bone lesions are evident. IMPRESSION: There is a persistent sizable right pleural effusion with right base atelectasis. There is a minimal left pleural effusion with patchy scarring and atelectasis in the left base region. Small pericardial effusion. Changes indicative of a degree of cor pulmonale, stable. Extensive mitral valve and annulus calcification. There are multiple foci of coronary artery calcification. No adenopathy. Bony changes of chronic renal failure. Filter in the inferior vena cava. Kidneys atrophic. Liver has a somewhat nodular contour, concerning for a degree of underlying hepatic cirrhosis. Electronically Signed   By: Bretta Bang III M.D.   On: 03/05/2016 15:47   US Venous Img Lower Unilateral Left  03/06/2016  CLINICAL DATA:  Shortness of breath.  History of DVT. EXAM: LEFT LOWER EXTREMITY VENOUS DOPPLER ULTRASOUND TECHNIQUE: Gray-scale sonography with graded compression, as well as color Doppler and duplex ultrasound were performed to evaluate the lower extremity deep venous systems from the level of the common femoral vein and including the common femoral, femoral,  profunda femoral, popliteal and calf veins including the posterior tibial, peroneal and gastrocnemius veins when visible. The superficial great saphenous vein was also interrogated. Spectral Doppler was utilized to evaluate flow at rest and with distal augmentation maneuvers in the common femoral, femoral and popliteal veins. COMPARISON:  None. FINDINGS: Contralateral Common Femoral Vein: Respiratory phasicity is normal and symmetric with the symptomatic side. No evidence of thrombus. Normal compressibility. Common Femoral Vein: No evidence of thrombus. Normal compressibility, respiratory phasicity and response to augmentation. Saphenofemoral Junction: No evidence of thrombus. Normal compressibility and flow on color Doppler imaging. Profunda Femoral Vein: No evidence of thrombus. Normal compressibility and flow on color Doppler imaging. Femoral Vein: No evidence of thrombus. Normal compressibility, respiratory phasicity and response to augmentation. Popliteal Vein: No evidence of thrombus. Normal compressibility, respiratory phasicity and response to augmentation. Calf Veins: No evidence of thrombus. Normal compressibility and flow on color Doppler imaging. Superficial Great Saphenous Vein: No evidence of thrombus. Normal compressibility and flow on color Doppler imaging. Other Findings:  None. IMPRESSION: No evidence of deep venous thrombosis. Electronically Signed   By: Maisie Fus  Register   On: 03/06/2016 11:20   US Venous Img Lower Unilateral Right  03/04/2016  CLINICAL DATA:  Pain and  swelling in the right leg for 2 weeks. Previous history of deep venous thrombosis. Aspirin anticoagulation therapy. EXAM: Right LOWER EXTREMITY VENOUS DOPPLER ULTRASOUND TECHNIQUE: Gray-scale sonography with graded compression, as well as color Doppler and duplex ultrasound were performed to evaluate the lower extremity deep venous systems from the level of the common femoral vein and including the common femoral, femoral,  profunda femoral, popliteal and calf veins including the posterior tibial, peroneal and gastrocnemius veins when visible. The superficial great saphenous vein was also interrogated. Spectral Doppler was utilized to evaluate flow at rest and with distal augmentation maneuvers in the common femoral, femoral and popliteal veins. COMPARISON:  None. FINDINGS: Contralateral Common Femoral Vein: Respiratory phasicity is normal and symmetric with the symptomatic side. No evidence of thrombus. Normal compressibility. Common Femoral Vein: No evidence of thrombus. Normal compressibility, respiratory phasicity and response to augmentation. Saphenofemoral Junction: No evidence of thrombus. Normal compressibility and flow on color Doppler imaging. Profunda Femoral Vein: No evidence of thrombus. Normal compressibility and flow on color Doppler imaging. Femoral Vein: No evidence of thrombus. Normal compressibility, respiratory phasicity and response to augmentation. Popliteal Vein: No evidence of thrombus. Normal compressibility, respiratory phasicity and response to augmentation. Calf Veins: No evidence of thrombus. Normal compressibility and flow on color Doppler imaging. Superficial Great Saphenous Vein: No evidence of thrombus. Normal compressibility and flow on color Doppler imaging. Venous Reflux:  None. Other Findings:  None. IMPRESSION: No evidence of deep venous thrombosis. Electronically Signed   By: Burman Nieves M.D.   On: 03/04/2016 19:31   Dg Chest Port 1 View  03/10/2016  CLINICAL DATA:  Chest pain, COPD, CHF EXAM: PORTABLE CHEST 1 VIEW COMPARISON:  03/06/2016 FINDINGS: There is a small right pleural effusion with basilar airspace disease. There is no other focal parenchymal opacity. There is no left pleural effusion. There is no pneumothorax. There is stable cardiomegaly. The osseous structures are unremarkable. IMPRESSION: 1. Small right pleural effusion with basilar airspace disease which may reflect  atelectasis versus pneumonia. 2. Stable cardiomegaly. Electronically Signed   By: Elige Ko   On: 03/10/2016 13:49   US Thoracentesis Asp Pleural Space W/img Guide  03/06/2016  INDICATION: Shortness of breath, large right pleural effusion by CT EXAM: ULTRASOUND GUIDED RIGHT THORACENTESIS MEDICATIONS: 1% lidocaine locally COMPLICATIONS: None immediate. PROCEDURE: An ultrasound guided thoracentesis was thoroughly discussed with the patient and questions answered. The benefits, risks, alternatives and complications were also discussed. The patient understands and wishes to proceed with the procedure. Written consent was obtained. Ultrasound was performed to localize and mark an adequate pocket of fluid in the right chest. The area was then prepped and draped in the normal sterile fashion. 1% Lidocaine was used for local anesthesia. Under ultrasound guidance a 6 Fr Safe-T-Centesis catheter was introduced. Thoracentesis was performed. The catheter was removed and a dressing applied. FINDINGS: A total of approximately 1.3 L of clear pleural fluid was removed. Samples were sent to the laboratory as requested by the clinical team. IMPRESSION: Successful ultrasound guided right thoracentesis yielding 1.3 L of pleural fluid. Electronically Signed   By: Judie Petit.  Shick M.D.   On: 03/06/2016 09:53     CBC  Recent Labs Lab 03/07/16 0431 03/10/16 0625 03/11/16 1955 03/12/16 0518  WBC 4.7 4.9 4.5 5.8  HGB 7.9* 8.2* 8.6* 8.1*  HCT 24.3* 24.9* 28.2* 25.8*  PLT 130* 151 174 182  MCV 83.5 83.5 85.5 84.9  MCH 27.2 27.4 26.1 26.6  MCHC 32.5 32.9 30.5 31.4  RDW 16.8*  17.2* 16.7* 16.7*    Chemistries   Recent Labs Lab 03/06/16 0657 03/07/16 0431 03/11/16 1955 03/12/16 0518  NA 131* 135  --  134*  K 3.7 3.9  --  4.0  CL 98* 99*  --  96*  CO2 24 24  --  21*  GLUCOSE 82 88  --  103*  BUN 52* 49*  --  50*  CREATININE 8.18* 8.22* 9.97* 9.81*  CALCIUM 7.8* 7.5*  --  8.4*  MG  --   --  1.8  --   AST 20  --    --  21  ALT 16  --   --  12*  ALKPHOS 83  --   --  97  BILITOT 0.5  --   --  0.7   ------------------------------------------------------------------------------------------------------------------ estimated creatinine clearance is 5.5 mL/min (by C-G formula based on Cr of 9.81). ------------------------------------------------------------------------------------------------------------------ No results for input(s): HGBA1C in the last 72 hours. ------------------------------------------------------------------------------------------------------------------ No results for input(s): CHOL, HDL, LDLCALC, TRIG, CHOLHDL, LDLDIRECT in the last 72 hours. ------------------------------------------------------------------------------------------------------------------ No results for input(s): TSH, T4TOTAL, T3FREE, THYROIDAB in the last 72 hours.  Invalid input(s): FREET3 ------------------------------------------------------------------------------------------------------------------  Recent Labs  03/12/16 0518  TIBC 221*  IRON 51    Coagulation profile  Recent Labs Lab 03/12/16 0843  INR 1.10    No results for input(s): DDIMER in the last 72 hours.  Cardiac Enzymes  Recent Labs Lab 03/05/16 1235  TROPONINI 0.05*   ------------------------------------------------------------------------------------------------------------------ Invalid input(s): POCBNP   CBG: No results for input(s): GLUCAP in the last 168 hours.     Studies: Dg Chest 2 View  03/12/2016  CLINICAL DATA:  Shortness of breath, history of CHF, mitral valve disease, Celsius OPD, dialysis dependent renal failure. EXAM: CHEST  2 VIEW COMPARISON:  Portable chest x-ray of March 10, 2016 FINDINGS: The left lung is mildly hyperinflated and clear. On the right there is mild volume loss due to a moderate-sized pleural effusion. There is right basilar atelectasis or pneumonia. The cardiac silhouette remains enlarged.  The pulmonary vascularity is mildly engorged. The mediastinum is normal in width. The bony thorax exhibits no acute abnormality. IMPRESSION: COPD. Moderate-sized right pleural effusion and right basilar atelectasis or pneumonia. CHF with mild pulmonary interstitial edema. Electronically Signed   By: David  [place] M.D.   On: 03/12/2016 07:50   Dg Chest Port 1 View  03/10/2016  CLINICAL DATA:  Chest pain, COPD, CHF EXAM: PORTABLE CHEST 1 VIEW COMPARISON:  03/06/2016 FINDINGS: There is a small right pleural effusion with basilar airspace disease. There is no other focal parenchymal opacity. There is no left pleural effusion. There is no pneumothorax. There is stable cardiomegaly. The osseous structures are unremarkable. IMPRESSION: 1. Small right pleural effusion with basilar airspace disease which may reflect atelectasis versus pneumonia. 2. Stable cardiomegaly. Electronically Signed   By: Elige KoHetal  Patel   On: 03/10/2016 13:49      No results found for: HGBA1C Lab Results  Component Value Date   MICROALBUR 63.1* 06/19/2014   LDLCALC 81 12/11/2015   CREATININE 9.81* 03/12/2016       Scheduled Meds: . aspirin  81 mg Oral Daily  . atorvastatin  80 mg Oral Daily  . calcitRIOL  0.5 mcg Oral Daily  . calcium acetate  2,001 mg Oral TID WC  . cinacalcet  30 mg Oral Q breakfast  . darbepoetin (ARANESP) injection - NON-DIALYSIS  150 mcg Subcutaneous Q Wed-1800  . dialysis solution 1.5% low-MG/low-CA   Intraperitoneal Q24H  .  dialysis solution 1.5% low-MG/low-CA   Intraperitoneal 6 times per day  . ferric gluconate (FERRLECIT/NULECIT) IV  500 mg Intravenous Once  . gentamicin cream  1 application Topical Daily  . heparin  5,000 Units Subcutaneous 3 times per day  . multivitamin  1 tablet Oral QHS  . pantoprazole  40 mg Oral BID  . sodium chloride flush  3 mL Intravenous Q12H  . sodium chloride  2 g Oral BID WC  . 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 If 7PM-7AM, please contact night-coverage at [email or web address], password [facility] 03/12/2016, 12:28 PM  LOS: 1 day

## 2016-03-12 NOTE — Progress Notes (Signed)
Cardiologist: Dr. Julien Nordmannimothy Gollan Subjective:  No SOB overnight  Objective:  Vital Signs in the last 24 hours: Temp:  [97.8 F (36.6 C)-98.7 F (37.1 C)] 98.7 F (37.1 C) (03/29 0756) Pulse Rate:  [70-90] 90 (03/29 0756) Resp:  [16-20] 16 (03/29 0756) BP: (116-131)/(80-96) 131/96 mmHg (03/29 0756) SpO2:  [96 %-97 %] 96 % (03/29 0756) Weight:  [111 lb 6.4 oz (50.531 kg)-115 lb 1.3 oz (52.2 kg)] 115 lb 1.3 oz (52.2 kg) (03/29 0551)  Intake/Output from previous day:  Intake/Output Summary (Last 24 hours) at 03/12/16 1218 Last data filed at 03/12/16 1000  Gross per 24 hour  Intake   4800 ml  Output   3450 ml  Net   1350 ml    Physical Exam: General appearance: alert, cooperative, cachectic and no distress Lungs: decreased Rt 1/3 Heart: regular rate and rhythm and 2/6 MR murmur Poor dention   Rate: 88  Rhythm: normal sinus rhythm  Lab Results:  Recent Labs  03/11/16 1955 03/12/16 0518  WBC 4.5 5.8  HGB 8.6* 8.1*  PLT 174 182    Recent Labs  03/11/16 1955 03/12/16 0518  NA  --  134*  K  --  4.0  CL  --  96*  CO2  --  21*  GLUCOSE  --  103*  BUN  --  50*  CREATININE 9.97* 9.81*   No results for input(s): TROPONINI in the last 72 hours.  Invalid input(s): CK, MB  Recent Labs  03/12/16 0843  INR 1.10    Scheduled Meds: . aspirin  81 mg Oral Daily  . atorvastatin  80 mg Oral Daily  . calcitRIOL  0.5 mcg Oral Daily  . calcium acetate  2,001 mg Oral TID WC  . cinacalcet  30 mg Oral Q breakfast  . darbepoetin (ARANESP) injection - NON-DIALYSIS  150 mcg Subcutaneous Q Wed-1800  . dialysis solution 1.5% low-MG/low-CA   Intraperitoneal Q24H  . dialysis solution 1.5% low-MG/low-CA   Intraperitoneal 6 times per day  . ferric gluconate (FERRLECIT/NULECIT) IV  500 mg Intravenous Once  . gentamicin cream  1 application Topical Daily  . heparin  5,000 Units Subcutaneous 3 times per day  . multivitamin  1 tablet Oral QHS  . pantoprazole  40 mg Oral BID  .  sodium chloride flush  3 mL Intravenous Q12H  . sodium chloride  2 g Oral BID WC  . tiotropium  18 mcg Inhalation Daily   Continuous Infusions:  PRN Meds:.diltiazem, docusate sodium, heparin   Imaging: Dg Chest 2 View  03/12/2016  CLINICAL DATA:  Shortness of breath, history of CHF, mitral valve disease, Celsius OPD, dialysis dependent renal failure. EXAM: CHEST  2 VIEW COMPARISON:  Portable chest x-ray of March 10, 2016 FINDINGS: The left lung is mildly hyperinflated and clear. On the right there is mild volume loss due to a moderate-sized pleural effusion. There is right basilar atelectasis or pneumonia. The cardiac silhouette remains enlarged. The pulmonary vascularity is mildly engorged. The mediastinum is normal in width. The bony thorax exhibits no acute abnormality. IMPRESSION: COPD. Moderate-sized right pleural effusion and right basilar atelectasis or pneumonia. CHF with mild pulmonary interstitial edema. Electronically Signed   By: David  SwazilandJordan M.D.   On: 03/12/2016 07:50   Dg Chest Port 1 View  03/10/2016  CLINICAL DATA:  Chest pain, COPD, CHF EXAM: PORTABLE CHEST 1 VIEW COMPARISON:  03/06/2016 FINDINGS: There is a small right pleural effusion with basilar airspace disease. There is no  other focal parenchymal opacity. There is no left pleural effusion. There is no pneumothorax. There is stable cardiomegaly. The osseous structures are unremarkable. IMPRESSION: 1. Small right pleural effusion with basilar airspace disease which may reflect atelectasis versus pneumonia. 2. Stable cardiomegaly. Electronically Signed   By: Elige Ko   On: 03/10/2016 13:49    Cardiac Studies: Echo 02/09/16 Study Conclusions  - Left ventricle: The cavity size was normal. Systolic function was  normal. The estimated ejection fraction was in the range of 55%  to 60%. - Aortic valve: There was mild regurgitation. Valve area (Vmax):  1.32 cm^2. - Mitral valve: Calcified annulus. Mobility was  restricted. Cannot  exclude vegetation. There was moderate regurgitation directed  eccentrically. Valve area by continuity equation (using LVOT  flow): 0.5 cm^2. - Left atrium: The atrium was moderately dilated. - Right atrium: The atrium was mildly to moderately dilated. - Tricuspid valve: There was moderate regurgitation. - Pulmonary arteries: PA peak pressure: 49 mm Hg (S).   Assessment/Plan:  55 y.o. AA female with severe mitral valve stenosis, end-stage renal disease on hemodialysis, history of stroke,who presented [facility] with CHF and chest pain. Cardiology consult for her chest pain symptoms and significant coronary and aortic atherosclerosis seen on CT scan.TEE this admission with severe mitral valve stenosis likely contributing to her pleural effusion, shortness of breath, difficulty with dialysis. She also had a pleural effusion on Rt requiring a tap. She was transferred to [facility] 03/11/16 for Rt and Lt cath and further evaluation.    Principal Problem:   CHF, acute on chronic secondary to valvular heart disease Active Problems:   Chest pain   Rheumatic mitral stenosis with regurgitation   Hypertension   Chronic kidney disease with peritoneal dialysis as preferred modality, stage 5 (HCC)   PAF- CHADs VASc=6   Pleural effusion on right   Hyperlipidemia   Anemia in chronic kidney disease   GERD (gastroesophageal reflux disease)   Hyperparathyroidism due to renal insufficiency (HCC)   History of CVA (cerebrovascular accident)   Poor dentition   PLAN: Moderate Rt effusion on CXR, this may need to be tapped again. Rt and Lt cath today- She has a history of PAF- currently in NSR. She was not on anticoagulation PTA, currently on IV Heparin.   Corine Shelter PA-C 03/12/2016, 12:18 PM [phone]  Personally seen and examined. Agree with above. 55 year old female with end-stage renal disease on hemodialysis, severe mitral stenosis on a history of stroke, significant  coronary and aortic atherosclerosis on CT scan with transesophageal echocardiogram demonstrating severe mitral stenosis likely contribute into her pleural effusion, shortness of breath and difficulty with dialysis. Transferred from [facility] for surgery consultation.  Severe mitral stenosis -Right and left heart catheterization today -Cardiothoracic surgery consultation following -Severely calcified valve, not amenable to balloon valvuloplasty.  End-stage renal disease -Hemodialysis  Pleural effusion -In part secondary to mitral stenosis causing elevated left atrial pressure.  Paroxysmal atrial fibrillation -Currently sinus rhythm -Anticoagulation when able. Currently on IV heparin. -Surgery performed, Maze procedure.

## 2016-03-12 NOTE — Procedures (Signed)
Attempted to obtain ABG on pt's right radial and instead obtained venous. RN informed.  Pt afraid of needless and not wanting to have redrawn at this time. Venous results 7.413/41/34.7/71.4.

## 2016-03-12 NOTE — Progress Notes (Signed)
RN received call from central tele stating that patient's HR was in the 130's. Patient was just cleaned up by RN and tech. Will assess and monitor at this time.

## 2016-03-12 NOTE — Progress Notes (Signed)
Pt transferred from 3e09 to 6e09. Telemetry box 6e09 applied, CCMT notified. Skin is dry and intact and was assessed with Rozann Leschesebecca Qiu, RN. PD catheter site to left lower abdomen, dressing is dry and intact. Patient states no pain at this time. Orders have been reviewed and implemented and patient has been oriented to the unit and unit staff. Bed alarm activated, will continue to monitor the patient closely.   Feliciana RossettiLaura Cotey Rakes, RN, BSN

## 2016-03-12 NOTE — Evaluation (Signed)
Occupational Therapy Evaluation Patient Details Name: Dorothy Long MRN: [id number] DOB: Dec 23, 1960 Today's Date: 03/12/2016    History of Present Illness  Pt is a 55 y.o. 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 PMH includes GI bleed, PAD, presenting 1 week after admission to [facility] for CP, Pleaural effusion, Mitral valve dysfunction in need of replacement and ESRD. Pt transferred to [facility] for valve replacement by CTS after cardiac clearance (cardiac Catheterization). 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   Clinical Impression   Pt admitted as above, demonstrating deficits in her ability to perform ADL tasks (see OT problem list below). She has extensive PMH and currently waiting for cardiac cath. presumably later today. Prior to this admission, she required assist with ADL's and functional transfers. She should benefit from acute OT followed by [facility] to assist in maximizing independence and decreasing burden of care for family.    Follow Up Recommendations  Home health OT;Supervision/Assistance - 24 hour    Equipment Recommendations  None recommended by OT (Pt/ family currently decline need )    Recommendations for Other Services       Precautions / Restrictions Precautions Precautions: Fall Restrictions Weight Bearing Restrictions: No      Mobility Bed Mobility Overal bed mobility: Needs Assistance Bed Mobility: Rolling;Sidelying to Sit;Sit to Supine Rolling: Supervision;Min guard (Use of rails) Sidelying to sit: Min assist (Assist for trunk and LE's off bed)   Sit to supine: Min assist (Min guard LE's back into bed and scooting up in bed using rails, bed flat)      Transfers Overall transfer level: Needs assistance Equipment used:  (Gait belt and pt places left UE on shoulder of person assisting and counts to 3 then stands. Assist R LE to increase base of support in standing) Transfers: Sit to/from [employer] Sit to Stand: Min assist Stand pivot transfers: Min assist       General transfer comment: Gait belt and pt places left UE on shoulder of person assisting and counts to 3 then stands. Assist for R LE to increase base of support in standing    Balance Overall balance assessment: Needs assistance Sitting-balance support: No upper extremity supported;Feet supported Sitting balance-Leahy Scale: Good     Standing balance support: Single extremity supported Standing balance-Leahy Scale: Poor Standing balance comment: Assist at R LE for widening base of support for static standing w/ Min A                            ADL Overall ADL's : Needs assistance/impaired     Grooming: Wash/dry hands;Wash/dry face;Sitting;Set up;Supervision/safety Grooming Details (indicate cue type and reason): Pt sitting EOB Upper Body Bathing: Supervision/ safety;Sitting;Min guard Upper Body Bathing Details (indicate cue type and reason): Secondary to R affected extremity from previous CVA Lower Body Bathing: Bed level;Minimal assistance;Moderate assistance Lower Body Bathing Details (indicate cue type and reason): Pt rolls onto R side to assist in peri hygeine after having BM in bed & uses L hand to clean herself. Pt rolls onto left side to straighten out sheets/pads using rails. Don/doff socks = Mod A sitting EOB Upper Body Dressing : Minimal assistance;Sitting   Lower Body Dressing: Moderate assistance;Sitting/lateral leans;Sit to/from stand   Toilet Transfer: Moderate assistance;Maximal assistance (Pt/husband state that pt wears depends, she is incontinent of bowel/bladder &/or can't get to bathroom in time.) Toilet Transfer Details (indicate cue type  and reason): Rolls R & L in bed for peri care/Hygiene. Uses L UE to assist         Functional mobility during ADLs: Minimal assistance;Caregiver able to provide necessary level of assistance (Sit to stand from EOB. Assist placing R LE on  floor and primarily only stands for SPT or dressing per pt and husband report. Pt reaches up onto therapist shoulder & counts to 3 then stands) General ADL Comments: Pt has extensive PMH and currently waiting for cardiac cath. presumably later today. Prior to this admission, she required assist with ADL's and functional transfers. She [facility] benefit [facility] in maximizing independence and decreasing burden of care for family.     Vision  Wears glasses at all times. No change from baseline   Perception     Praxis      Pertinent Vitals/Pain Pain Assessment: No/denies pain     Hand Dominance Left (was R HD until recent CVA, now uses left as dominant hand secondary to R affected side.)   Extremity/Trunk Assessment Upper Extremity Assessment Upper Extremity Assessment: RUE deficits/detail RUE Deficits / Details: CVA 11/2015 affecting R side/hemiparesis; limited R active shoulder flexion to ~ 90* PROM WFL's. Able to reach behind her head given increased time. 2+/5 R UE, impaired coordination/fine and gross motor funciton RUE Coordination: decreased fine motor;decreased gross motor   Lower Extremity Assessment Lower Extremity Assessment: Defer to PT evaluation       Communication Communication Communication: Other (comment);Expressive difficulties (Aphasia since stroke Dec 2016 per pt husband.)   Cognition Arousal/Alertness: Awake/alert Behavior During Therapy: WFL for tasks assessed/performed Overall Cognitive Status: Within Functional Limits for tasks assessed (difficult to assess secondary to expressive aphasia however given increased time, pt able to express needs)                     General Comments       Exercises       Shoulder Instructions      Home Living Family/patient expects to be discharged to:: Private residence Living Arrangements: Spouse/significant other;Children Available Help at Discharge: Family Type of Home:  House Home Access: Ramped entrance     Home Layout: One level     Bathroom Shower/Tub: Tub/shower unit Shower/tub characteristics: Engineer, building servicesCurtain Bathroom Toilet: Standard     Home Equipment: Wheelchair - manual          Prior Functioning/Environment Level of Independence: Needs assistance  Gait / Transfers Assistance Needed: Pt husband states tht family assists with transfers (SPT to/from bed to chair/w/c, otherwise pt in bed) they use manual w/c for activities/appointments out of the house. ADL's / Homemaking Assistance Needed: Assist with ADL's and home making. Bathing, dressing, wears depends and family assists in changing and hygiene.  Communication / Swallowing Assistance Needed: Expressive aphasia      OT Diagnosis: Generalized weakness   OT Problem List: Decreased activity tolerance;Impaired balance (sitting and/or standing);Decreased knowledge of use of DME or AE;Cardiopulmonary status limiting activity;Impaired UE functional use   OT Treatment/Interventions: Self-care/ADL training;DME and/or AE instruction;Patient/family education;Therapeutic activities    OT Goals(Current goals can be found in the care plan section) Acute Rehab OT Goals Patient Stated Goal: Home when able Time For Goal Achievement: 03/26/16 Potential to Achieve Goals: Good ADL Goals Pt Will Perform Lower Body Bathing: with set-up;with supervision;bed level;with caregiver independent in assisting Pt Will Perform Lower Body Dressing: with min assist;with caregiver independent in assisting;bed level;sitting/lateral leans;sit to/from stand Pt Will  Transfer to Toilet: with min assist;stand pivot transfer;bedside commode Pt Will Perform Toileting - Clothing Manipulation and hygiene: with min assist;with caregiver independent in assisting;sitting/lateral leans;sit to/from stand;bed level  OT Frequency: Min 2X/week   Barriers to D/C:            Co-evaluation              End of Session Equipment  Utilized During Treatment: Gait belt Nurse Communication: Mobility status;Other (comment) (Pt requesting use of depends)  Activity Tolerance: Patient tolerated treatment well Patient left: in bed;with call bell/phone within reach;with bed alarm set;with family/visitor present;Other (comment) (Renal team in room)   Time: 1610-9604 OT Time Calculation (min): 30 min Charges:  OT General Charges $OT Visit: 1 Procedure OT Evaluation $OT Eval Moderate Complexity: 1 Procedure OT Treatments $Self Care/Home Management : 8-22 mins G-Codes:    Alm Bustard, OTR/L 03/12/2016, 11:32 AM

## 2016-03-12 NOTE — Significant Event (Signed)
Rapid Response Event Note Called by primary RN to see pt for hypotension Overview: Time Called: 1912 Arrival Time: 1915 Event Type: Hypotension  Initial Focused Assessment:  On arrival to the floor the pt is resting in bed.  She responds with gentle sternal stimulation, pupils pinpoint, and denies back pain.  Rt groin cath site level 0 without evidence of bleeding or hematoma.  DPP equal +1.  Discussed pt with B. Strader, PA and Narcan given as well as a 250cc NS bolus.  Pt woke up immediately with narcan and began responding much better.  SBP improved to 110.  NS bolus started by primary RN.  Will cont. To monitor. Interventions: Narcan 0.4mg  IV NS 250cc x1  Event Summary: Name of Physician Notified: B. Iran OuchStrader, PA at 1925    at    Outcome: Stayed in room and stabalized     Dorothy Long

## 2016-03-12 NOTE — Progress Notes (Signed)
Notified NP on call for HR at this time. Awaiting on any further orders

## 2016-03-12 NOTE — Progress Notes (Signed)
Subjective: Interval History: ?to have procedure.  Objective: Vital signs in last 24 hours: Temp:  [97.8 F (36.6 C)-98.7 F (37.1 C)] 98.7 F (37.1 C) (03/29 0756) Pulse Rate:  [70-90] 90 (03/29 0756) Resp:  [16-20] 16 (03/29 0756) BP: (116-131)/(80-96) 131/96 mmHg (03/29 0756) SpO2:  [96 %-97 %] 96 % (03/29 0756) Weight:  [50.531 kg (111 lb 6.4 oz)-52.2 kg (115 lb 1.3 oz)] 52.2 kg (115 lb 1.3 oz) (03/29 0551) Weight change:   Intake/Output from previous day: 03/28 0701 - 03/29 0700 In: 3200  Out: 3450  Intake/Output this shift: Total I/O In: 1600 [Other:1600] Out: -   General appearance: cooperative and speech issues Resp: rales LLL Cardio: S1, S2 normal, systolic murmur: holosystolic 2/6, blowing at apex and diastolic murmur: mid diastolic 1/6, decrescendo at apex GI: pos fw, PD cath ok.  soft Extremities: extremities normal, atraumatic, no cyanosis or edema  Emaciated  Lab Results:  Recent Labs  03/11/16 1955 03/12/16 0518  WBC 4.5 5.8  HGB 8.6* 8.1*  HCT 28.2* 25.8*  PLT 174 182   BMET:  Recent Labs  03/11/16 1955 03/12/16 0518  NA  --  134*  K  --  4.0  CL  --  96*  CO2  --  21*  GLUCOSE  --  103*  BUN  --  50*  CREATININE 9.97* 9.81*  CALCIUM  --  8.4*   No results for input(s): PTH in the last 72 hours. Iron Studies:  Recent Labs  03/12/16 0518  IRON 51  TIBC 221*    Studies/Results: Dg Chest 2 View  03/12/2016  CLINICAL DATA:  Shortness of breath, history of CHF, mitral valve disease, Celsius OPD, dialysis dependent renal failure. EXAM: CHEST  2 VIEW COMPARISON:  Portable chest x-ray of March 10, 2016 FINDINGS: The left lung is mildly hyperinflated and clear. On the right there is mild volume loss due to a moderate-sized pleural effusion. There is right basilar atelectasis or pneumonia. The cardiac silhouette remains enlarged. The pulmonary vascularity is mildly engorged. The mediastinum is normal in width. The bony thorax exhibits no acute  abnormality. IMPRESSION: COPD. Moderate-sized right pleural effusion and right basilar atelectasis or pneumonia. CHF with mild pulmonary interstitial edema. Electronically Signed   By: David  SwazilandJordan M.D.   On: 03/12/2016 07:50   Dg Chest Port 1 View  03/10/2016  CLINICAL DATA:  Chest pain, COPD, CHF EXAM: PORTABLE CHEST 1 VIEW COMPARISON:  03/06/2016 FINDINGS: There is a small right pleural effusion with basilar airspace disease. There is no other focal parenchymal opacity. There is no left pleural effusion. There is no pneumothorax. There is stable cardiomegaly. The osseous structures are unremarkable. IMPRESSION: 1. Small right pleural effusion with basilar airspace disease which may reflect atelectasis versus pneumonia. 2. Stable cardiomegaly. Electronically Signed   By: Elige KoHetal  Patel   On: 03/10/2016 13:49    I have reviewed the patient's current medications.  Assessment/Plan: 1 ESRD PD going well. Need to slowly lower vol.  2 malnutrition VERY LOW ALBUMEN BAD PROGNOSTIC SIGN 3 Anemia esa , give FE 4 hPTH check 5 Past CVA 6 MR/MS per cards P PD 2.5%. IV Fe, check PTH    LOS: 1 day   Pelagia Iacobucci L 03/12/2016,11:03 AM

## 2016-03-12 NOTE — Progress Notes (Signed)
IV rt lower forearm no good. D/C'ed.

## 2016-03-12 NOTE — H&P (View-Only) (Signed)
 Cardiologist: Dr. Timothy Gollan Subjective:  No SOB overnight  Objective:  Vital Signs in the last 24 hours: Temp:  [97.8 F (36.6 C)-98.7 F (37.1 C)] 98.7 F (37.1 C) (03/29 0756) Pulse Rate:  [70-90] 90 (03/29 0756) Resp:  [16-20] 16 (03/29 0756) BP: (116-131)/(80-96) 131/96 mmHg (03/29 0756) SpO2:  [96 %-97 %] 96 % (03/29 0756) Weight:  [111 lb 6.4 oz (50.531 kg)-115 lb 1.3 oz (52.2 kg)] 115 lb 1.3 oz (52.2 kg) (03/29 0551)  Intake/Output from previous day:  Intake/Output Summary (Last 24 hours) at 03/12/16 1218 Last data filed at 03/12/16 1000  Gross per 24 hour  Intake   4800 ml  Output   3450 ml  Net   1350 ml    Physical Exam: General appearance: alert, cooperative, cachectic and no distress Lungs: decreased Rt 1/3 Heart: regular rate and rhythm and 2/6 MR murmur Poor dention   Rate: 88  Rhythm: normal sinus rhythm  Lab Results:  Recent Labs  03/11/16 1955 03/12/16 0518  WBC 4.5 5.8  HGB 8.6* 8.1*  PLT 174 182    Recent Labs  03/11/16 1955 03/12/16 0518  NA  --  134*  K  --  4.0  CL  --  96*  CO2  --  21*  GLUCOSE  --  103*  BUN  --  50*  CREATININE 9.97* 9.81*   No results for input(s): TROPONINI in the last 72 hours.  Invalid input(s): CK, MB  Recent Labs  03/12/16 0843  INR 1.10    Scheduled Meds: . aspirin  81 mg Oral Daily  . atorvastatin  80 mg Oral Daily  . calcitRIOL  0.5 mcg Oral Daily  . calcium acetate  2,001 mg Oral TID WC  . cinacalcet  30 mg Oral Q breakfast  . darbepoetin (ARANESP) injection - NON-DIALYSIS  150 mcg Subcutaneous Q Wed-1800  . dialysis solution 1.5% low-MG/low-CA   Intraperitoneal Q24H  . dialysis solution 1.5% low-MG/low-CA   Intraperitoneal 6 times per day  . ferric gluconate (FERRLECIT/NULECIT) IV  500 mg Intravenous Once  . gentamicin cream  1 application Topical Daily  . heparin  5,000 Units Subcutaneous 3 times per day  . multivitamin  1 tablet Oral QHS  . pantoprazole  40 mg Oral BID  .  sodium chloride flush  3 mL Intravenous Q12H  . sodium chloride  2 g Oral BID WC  . tiotropium  18 mcg Inhalation Daily   Continuous Infusions:  PRN Meds:.diltiazem, docusate sodium, heparin   Imaging: Dg Chest 2 View  03/12/2016  CLINICAL DATA:  Shortness of breath, history of CHF, mitral valve disease, Celsius OPD, dialysis dependent renal failure. EXAM: CHEST  2 VIEW COMPARISON:  Portable chest x-ray of March 10, 2016 FINDINGS: The left lung is mildly hyperinflated and clear. On the right there is mild volume loss due to a moderate-sized pleural effusion. There is right basilar atelectasis or pneumonia. The cardiac silhouette remains enlarged. The pulmonary vascularity is mildly engorged. The mediastinum is normal in width. The bony thorax exhibits no acute abnormality. IMPRESSION: COPD. Moderate-sized right pleural effusion and right basilar atelectasis or pneumonia. CHF with mild pulmonary interstitial edema. Electronically Signed   By: David  Jordan M.D.   On: 03/12/2016 07:50   Dg Chest Port 1 View  03/10/2016  CLINICAL DATA:  Chest pain, COPD, CHF EXAM: PORTABLE CHEST 1 VIEW COMPARISON:  03/06/2016 FINDINGS: There is a small right pleural effusion with basilar airspace disease. There is no   other focal parenchymal opacity. There is no left pleural effusion. There is no pneumothorax. There is stable cardiomegaly. The osseous structures are unremarkable. IMPRESSION: 1. Small right pleural effusion with basilar airspace disease which may reflect atelectasis versus pneumonia. 2. Stable cardiomegaly. Electronically Signed   By: Hetal  Patel   On: 03/10/2016 13:49    Cardiac Studies: Echo 02/09/16 Study Conclusions  - Left ventricle: The cavity size was normal. Systolic function was  normal. The estimated ejection fraction was in the range of 55%  to 60%. - Aortic valve: There was mild regurgitation. Valve area (Vmax):  1.32 cm^2. - Mitral valve: Calcified annulus. Mobility was  restricted. Cannot  exclude vegetation. There was moderate regurgitation directed  eccentrically. Valve area by continuity equation (using LVOT  flow): 0.5 cm^2. - Left atrium: The atrium was moderately dilated. - Right atrium: The atrium was mildly to moderately dilated. - Tricuspid valve: There was moderate regurgitation. - Pulmonary arteries: PA peak pressure: 49 mm Hg (S).   Assessment/Plan:  55 y.o. AA female with severe mitral valve stenosis, end-stage renal disease on hemodialysis, history of stroke,who presented to [facility] 03/04/16 with CHF and chest pain. Cardiology consult for her chest pain symptoms and significant coronary and aortic atherosclerosis seen on CT scan.TEE this admission with severe mitral valve stenosis likely contributing to her pleural effusion, shortness of breath, difficulty with dialysis. She also had a pleural effusion on Rt requiring a tap. She was transferred to [facility] 03/11/16 for Rt and Lt cath and further evaluation.    Principal Problem:   CHF, acute on chronic secondary to valvular heart disease Active Problems:   Chest pain   Rheumatic mitral stenosis with regurgitation   Hypertension   Chronic kidney disease with peritoneal dialysis as preferred modality, stage 5 (HCC)   PAF- CHADs VASc=6   Pleural effusion on right   Hyperlipidemia   Anemia in chronic kidney disease   GERD (gastroesophageal reflux disease)   Hyperparathyroidism due to renal insufficiency (HCC)   History of CVA (cerebrovascular accident)   Poor dentition   PLAN: Moderate Rt effusion on CXR, this may need to be tapped again. Rt and Lt cath today- She has a history of PAF- currently in NSR. She was not on anticoagulation PTA, currently on IV Heparin.   Luke Kilroy PA-C 03/12/2016, 12:18 PM [phone]  Personally seen and examined. Agree with above. 55-year-old female with end-stage renal disease on hemodialysis, severe mitral stenosis on a history of stroke, significant  coronary and aortic atherosclerosis on CT scan with transesophageal echocardiogram demonstrating severe mitral stenosis likely contribute into her pleural effusion, shortness of breath and difficulty with dialysis. Transferred from [place] for surgery consultation.  Severe mitral stenosis -Right and left heart catheterization today -Cardiothoracic surgery consultation following -Severely calcified valve, not amenable to balloon valvuloplasty.  End-stage renal disease -Hemodialysis  Pleural effusion -In part secondary to mitral stenosis causing elevated left atrial pressure.  Paroxysmal atrial fibrillation -Currently sinus rhythm -Anticoagulation when able. Currently on IV heparin. -Surgery performed, Maze procedure.  

## 2016-03-12 NOTE — Progress Notes (Signed)
PT Cancellation Note  Patient Details Name: Dorothy GarretDonnella R Long MRN: [id number] DOB: [birth year]   Cancelled Treatment:    Reason Eval/Treat Not Completed: Patient at procedure or test/unavailable Undergoing peritoneal dialysis. Will check back later.  Berton MountBarbour, Daven Montz S 03/12/2016, 1:26 PM  [name], [place]PT [phone]

## 2016-03-12 NOTE — Progress Notes (Signed)
RN went in to get patients vitals for 1845 and noticed patient was feeling sick. Patient having nausea and vomited. RN went to retrieve nausea medicine while NT helped clean patient up. After patient cleaned up RN took her Vitals. BP repeatedly low around 64/50. RN called charge nurse to help assess and then called [facility]. Will continue to monitor patient.

## 2016-03-12 NOTE — Progress Notes (Signed)
Patient wants to wear her depends so not to soil the bed. RN has educated patient and spouse about how we do not use them on this floor but she insists on continuing to use them.

## 2016-03-13 ENCOUNTER — Other Ambulatory Visit (HOSPITAL_COMMUNITY): Payer: BC Managed Care – PPO

## 2016-03-13 ENCOUNTER — Inpatient Hospital Stay (HOSPITAL_COMMUNITY): Payer: BC Managed Care – PPO

## 2016-03-13 ENCOUNTER — Encounter (HOSPITAL_COMMUNITY): Payer: Self-pay | Admitting: Interventional Cardiology

## 2016-03-13 ENCOUNTER — Inpatient Hospital Stay (HOSPITAL_COMMUNITY): Payer: BC Managed Care – PPO | Admitting: Anesthesiology

## 2016-03-13 ENCOUNTER — Encounter (HOSPITAL_COMMUNITY)
Admission: AD | Disposition: A | Discharge status: Home Health | Payer: Self-pay | Source: Other Acute Inpatient Hospital | Attending: Internal Medicine

## 2016-03-13 DIAGNOSIS — Z8673 Personal history of transient ischemic attack (TIA), and cerebral infarction without residual deficits: Secondary | ICD-10-CM

## 2016-03-13 DIAGNOSIS — J90 Pleural effusion, not elsewhere classified: Secondary | ICD-10-CM

## 2016-03-13 DIAGNOSIS — E43 Unspecified severe protein-calorie malnutrition: Secondary | ICD-10-CM

## 2016-03-13 DIAGNOSIS — I5031 Acute diastolic (congestive) heart failure: Secondary | ICD-10-CM

## 2016-03-13 HISTORY — PX: CHEST TUBE INSERTION: SHX231

## 2016-03-13 LAB — COMPREHENSIVE METABOLIC PANEL
ALK PHOS: 96 U/L (ref 38–126)
ALT: 11 U/L — AB (ref 14–54)
ANION GAP: 17 — AB (ref 5–15)
AST: 25 U/L (ref 15–41)
Albumin: 1.8 g/dL — ABNORMAL LOW (ref 3.5–5.0)
BILIRUBIN TOTAL: 0.7 mg/dL (ref 0.3–1.2)
BUN: 44 mg/dL — AB (ref 6–20)
CALCIUM: 7.7 mg/dL — AB (ref 8.9–10.3)
CO2: 20 mmol/L — AB (ref 22–32)
Chloride: 97 mmol/L — ABNORMAL LOW (ref 101–111)
Creatinine, Ser: 8.87 mg/dL — ABNORMAL HIGH (ref 0.44–1.00)
GFR calc Af Amer: 5 mL/min — ABNORMAL LOW (ref >60)
GFR calc non Af Amer: 5 mL/min — ABNORMAL LOW (ref >60)
GLUCOSE: 133 mg/dL — AB (ref 65–99)
Potassium: 3.6 mmol/L (ref 3.5–5.1)
SODIUM: 134 mmol/L — AB (ref 135–145)
TOTAL PROTEIN: 5.3 g/dL — AB (ref 6.5–8.1)

## 2016-03-13 LAB — GLUCOSE, CAPILLARY: Glucose-Capillary: 95 mg/dL (ref 65–99)

## 2016-03-13 LAB — HEMOGLOBIN A1C
HEMOGLOBIN A1C: 5.5 % (ref 4.8–5.6)
MEAN PLASMA GLUCOSE: 111 mg/dL

## 2016-03-13 LAB — CBC
HEMATOCRIT: 30.9 % — AB (ref 36.0–46.0)
HEMOGLOBIN: 9.6 g/dL — AB (ref 12.0–15.0)
MCH: 26 pg (ref 26.0–34.0)
MCHC: 31.1 g/dL (ref 30.0–36.0)
MCV: 83.7 fL (ref 78.0–100.0)
Platelets: 176 10*3/uL (ref 150–400)
RBC: 3.69 MIL/uL — ABNORMAL LOW (ref 3.87–5.11)
RDW: 16.6 % — ABNORMAL HIGH (ref 11.5–15.5)
WBC: 12.4 10*3/uL — ABNORMAL HIGH (ref 4.0–10.5)

## 2016-03-13 LAB — PARATHYROID HORMONE, INTACT (NO CA): PTH: 79 pg/mL — ABNORMAL HIGH (ref 15–65)

## 2016-03-13 LAB — MAGNESIUM: Magnesium: 1.7 mg/dL (ref 1.7–2.4)

## 2016-03-13 LAB — PHOSPHORUS: PHOSPHORUS: 4.5 mg/dL (ref 2.5–4.6)

## 2016-03-13 SURGERY — INSERTION, PLEURAL DRAINAGE CATHETER
Anesthesia: Monitor Anesthesia Care | Site: Chest | Laterality: Right

## 2016-03-13 MED ORDER — ACETAMINOPHEN 325 MG PO TABS: 650.0000 mg | ORAL_TABLET | ORAL | Status: DC | PRN

## 2016-03-13 MED ORDER — PROPOFOL 10 MG/ML IV BOLUS
INTRAVENOUS | Status: AC
  Filled 2016-03-13: qty 20

## 2016-03-13 MED ORDER — MORPHINE SULFATE (PF) 2 MG/ML IV SOLN: 2.0000 mg | INTRAVENOUS | Status: DC | PRN

## 2016-03-13 MED ORDER — 0.9 % SODIUM CHLORIDE (POUR BTL) OPTIME
TOPICAL | Status: DC | PRN
  Administered 2016-03-13: 1000 mL

## 2016-03-13 MED ORDER — OXYCODONE HCL 5 MG PO TABS: 5.0000 mg | ORAL_TABLET | Freq: Once | ORAL | Status: DC | PRN

## 2016-03-13 MED ORDER — LIDOCAINE HCL (PF) 1 % IJ SOLN
INTRAMUSCULAR | Status: AC
  Filled 2016-03-13: qty 30

## 2016-03-13 MED ORDER — ACETAMINOPHEN 325 MG PO TABS: 325.0000 mg | ORAL_TABLET | ORAL | Status: DC | PRN

## 2016-03-13 MED ORDER — SODIUM CHLORIDE 0.9% FLUSH: 3.0000 mL | INTRAVENOUS | Status: DC | PRN

## 2016-03-13 MED ORDER — LIDOCAINE HCL 1 % IJ SOLN
INTRAMUSCULAR | Status: DC | PRN
  Administered 2016-03-13: 9 mL

## 2016-03-13 MED ORDER — TRAMADOL HCL 50 MG PO TABS: 50.0000 mg | ORAL_TABLET | Freq: Four times a day (QID) | ORAL | Status: DC | PRN

## 2016-03-13 MED ORDER — FENTANYL CITRATE (PF) 100 MCG/2ML IJ SOLN
INTRAMUSCULAR | Status: DC | PRN
  Administered 2016-03-13: 25 ug via INTRAVENOUS

## 2016-03-13 MED ORDER — ONDANSETRON HCL 4 MG/2ML IJ SOLN: 4.0000 mg | Freq: Once | INTRAMUSCULAR | Status: DC | PRN

## 2016-03-13 MED ORDER — OXYCODONE HCL 5 MG/5ML PO SOLN: 5.0000 mg | Freq: Once | ORAL | Status: DC | PRN

## 2016-03-13 MED ORDER — SODIUM CHLORIDE 0.9 % IV SOLN: 250.0000 mL | INTRAVENOUS | Status: DC | PRN

## 2016-03-13 MED ORDER — SODIUM CHLORIDE 0.9% FLUSH
3.0000 mL | Freq: Two times a day (BID) | INTRAVENOUS | Status: DC
  Administered 2016-03-13 – 2016-03-14 (×2): 3 mL via INTRAVENOUS

## 2016-03-13 MED ORDER — MORPHINE SULFATE (PF) 2 MG/ML IV SOLN
1.0000 mg | INTRAVENOUS | Status: DC | PRN
  Administered 2016-03-13 – 2016-03-14 (×5): 1 mg via INTRAVENOUS
  Filled 2016-03-13 (×5): qty 1

## 2016-03-13 MED ORDER — MIDAZOLAM HCL 2 MG/2ML IJ SOLN
INTRAMUSCULAR | Status: AC
  Filled 2016-03-13: qty 2

## 2016-03-13 MED ORDER — ACETAMINOPHEN 160 MG/5ML PO SOLN: 325.0000 mg | ORAL | Status: DC | PRN

## 2016-03-13 MED ORDER — DOCUSATE SODIUM 100 MG PO CAPS
100.0000 mg | ORAL_CAPSULE | Freq: Every day | ORAL | Status: DC
  Administered 2016-03-14: 100 mg via ORAL
  Filled 2016-03-13 (×2): qty 1

## 2016-03-13 MED ORDER — MIDAZOLAM HCL 5 MG/5ML IJ SOLN
INTRAMUSCULAR | Status: DC | PRN
  Administered 2016-03-13: 0.5 mg via INTRAVENOUS

## 2016-03-13 MED ORDER — FENTANYL CITRATE (PF) 100 MCG/2ML IJ SOLN: 25.0000 ug | INTRAMUSCULAR | Status: DC | PRN

## 2016-03-13 MED ORDER — FENTANYL CITRATE (PF) 250 MCG/5ML IJ SOLN
INTRAMUSCULAR | Status: AC
  Filled 2016-03-13: qty 5

## 2016-03-13 MED ORDER — ACETAMINOPHEN 650 MG RE SUPP: 650.0000 mg | RECTAL | Status: DC | PRN

## 2016-03-13 MED ORDER — SODIUM CHLORIDE 0.9 % IV SOLN
INTRAVENOUS | Status: DC | PRN
  Administered 2016-03-13: 14:00:00 via INTRAVENOUS

## 2016-03-13 MED ORDER — SODIUM CHLORIDE 0.9 % IV SOLN
INTRAVENOUS | Status: DC
  Administered 2016-03-13: 13:00:00 via INTRAVENOUS

## 2016-03-13 SURGICAL SUPPLY — 26 items
BRUSH SCRUB EZ PLAIN DRY (MISCELLANEOUS) ×4 IMPLANT
CANISTER SUCTION 2500CC (MISCELLANEOUS) ×2 IMPLANT
COVER SURGICAL LIGHT HANDLE (MISCELLANEOUS) IMPLANT
DERMABOND ADVANCED (GAUZE/BANDAGES/DRESSINGS) ×1
DERMABOND ADVANCED .7 DNX12 (GAUZE/BANDAGES/DRESSINGS) ×1 IMPLANT
DRAPE C-ARM 42X72 X-RAY (DRAPES) ×2 IMPLANT
DRAPE LAPAROSCOPIC ABDOMINAL (DRAPES) ×2 IMPLANT
GLOVE BIOGEL PI IND STRL 7.0 (GLOVE) ×4 IMPLANT
GLOVE BIOGEL PI INDICATOR 7.0 (GLOVE) ×4
GLOVE ORTHO TXT STRL SZ7.5 (GLOVE) ×2 IMPLANT
GOWN STRL REUS W/ TWL LRG LVL3 (GOWN DISPOSABLE) ×3 IMPLANT
GOWN STRL REUS W/TWL LRG LVL3 (GOWN DISPOSABLE) ×3
KIT BASIN OR (CUSTOM PROCEDURE TRAY) ×2 IMPLANT
KIT PLEURX DRAIN CATH 1000ML (MISCELLANEOUS) ×4 IMPLANT
KIT PLEURX DRAIN CATH 15.5FR (DRAIN) ×2 IMPLANT
KIT ROOM TURNOVER OR (KITS) ×2 IMPLANT
NS IRRIG 1000ML POUR BTL (IV SOLUTION) ×2 IMPLANT
PACK GENERAL/GYN (CUSTOM PROCEDURE TRAY) ×2 IMPLANT
PAD ARMBOARD 7.5X6 YLW CONV (MISCELLANEOUS) ×4 IMPLANT
SET DRAINAGE LINE (MISCELLANEOUS) IMPLANT
SUT ETHILON 3 0 FSL (SUTURE) ×2 IMPLANT
SUT VIC AB 3-0 X1 27 (SUTURE) ×2 IMPLANT
TOWEL OR 17X24 6PK STRL BLUE (TOWEL DISPOSABLE) ×2 IMPLANT
TOWEL OR 17X26 10 PK STRL BLUE (TOWEL DISPOSABLE) ×2 IMPLANT
VALVE REPLACEMENT CAP (MISCELLANEOUS) IMPLANT
WATER STERILE IRR 1000ML POUR (IV SOLUTION) ×2 IMPLANT

## 2016-03-13 NOTE — Progress Notes (Signed)
[facility] Progress Note   Dorothy Long  ZOX:[id number]  DOB: [birth year]  DOA: 03/11/2016 PCP: Sherlene Shams, MD  Brief narrative: Dorothy Long is a 55 y.o. female who is wheelchair bound since CVA in 12/16 with COPD, HTN, HLD, PAF, GI bleed from severe esophagitis recentl, DM, ESRD on PD, severe Mitral stenosis admitted to [facility] on 3/21 with CHF and chest pain. TEE found severe mitral stenosis. She was transferred to [facility] on 3/28 for right and left heart cath. She had a right pleural effusion and a thoracentesis on 3/23. CT sugery consulted. Dr Cornelius Moras notes she is a high risk for surgery andrecommended a palliative approach with a pleur-x cath   Subjective: 10/10 pain at site of pleurex. No other complaints.   Assessment/Plan: Principal Problem:   Severe mitral stenosis/ right pleural effusion - s/p pleur-x cath today- Dr Cornelius Moras has ordered [facility] to manage pleurex  - Morphine for severe pain- Tramadol for moderate  Active Problems:    Chronic kidney disease with peritoneal dialysis as preferred modality, stage 5  - cont dialysis. Phoslo, Sensipar    GERD (gastroesophageal reflux disease)/ Esophagitis - BID Protonix    PAF- CHADs VASc=6 - s/p Maze procedure - only Aspirin 81 mg daily- recent GI bleeds -  not on rate controlling agent    History of CVA (cerebrovascular accident) - wheelchair bound   COPD  - continue spiriva and albuterol PRN  Antibiotics: Anti-infectives    None     Code Status:     Code Status Orders        Start     Ordered   03/11/16 1824  Full code   Continuous     03/11/16 1826    Code Status History    Date Active Date Inactive Code Status Order ID Comments User Context   03/05/2016  2:09 AM 03/11/2016  5:54 PM Full Code [id number]  Oralia Manis, MD ED   02/06/2016 10:18 PM 02/12/2016  4:38 PM DNR [id number]  Milagros Loll, MD ED   12/11/2015  3:28 AM 12/19/2015  4:53 PM Full Code [id number]  Ihor Austin, MD Inpatient   11/17/2015  4:50 PM 11/20/2015  3:18 PM Full Code [id number]  Houston Siren, MD Inpatient   10/11/2015  9:32 PM 10/12/2015  5:10 PM Full Code [id number]  Oralia Manis, MD Inpatient   06/23/2015 12:43 AM 06/24/2015  2:21 PM Full Code [id number]  Oralia Manis, MD Inpatient   06/15/2015  2:57 AM 06/16/2015  4:01 PM Full Code [id number]  Wyatt Haste, MD ED     Family Communication: husband and father at bedside Disposition Plan: home with [facility] in AM DVT prophylaxis: SCDs Consultants: cardiology, CT surg Procedures: Pleur-x, TEE, Cath- left and right heart    Objective: [facility] Weights   03/11/16 1535 03/12/16 0551 03/13/16 0353  Weight: 50.531 kg (111 lb 6.4 oz) 52.2 kg (115 lb 1.3 oz) 52.4 kg (115 lb 8.3 oz)    Intake/Output Summary (Last 24 hours) at 03/13/16 1727 Last data filed at 03/13/16 1445  Gross per 24 hour  Intake   5440 ml  Output   3602 ml  Net   1838 ml     Vitals Filed Vitals:   03/13/16 0900 03/13/16 1456 03/13/16 1510 03/13/16 1529  BP: 95/80  Pulse: 87 87 89 87  Temp: 98.5 F (36.9 C) 98 F (36.7 C)  98 F (36.7 C)  TempSrc: Oral  Resp: 18 14 10 9   Height:      Weight:      SpO2: 100% 100% 97% 97%    Exam:  General:  Pt is alert, not in acute distress  HEENT: No icterus, No thrush, oral mucosa moist  Cardiovascular: regular rate and rhythm, S1/S2 No murmur  Respiratory: clear to auscultation bilaterally - right posterior pleur-x- dressing not opened  Abdomen: Soft, +Bowel sounds, non tender, non distended, no guarding  MSK: No cyanosis or clubbing- no pedal edema   Data Reviewed: Basic Metabolic Panel:  Recent Labs Lab 03/07/16 0431 03/09/16 1357 03/11/16 1955 03/12/16 0518 03/13/16 0500  NA 135  --   --  134* 134*  K 3.9  --   --  4.0 3.6  CL 99*  --   --  96* 97*  CO2 24  --   --  21* 20*  GLUCOSE 88  --   --  103* 133*  BUN 49*  --   --  50* 44*  CREATININE 8.22*  --  9.97* 9.81* 8.87*  CALCIUM 7.5*  --   --  8.4*  7.7*  MG  --   --  1.8  --  1.7  PHOS  --  5.5* 5.4*  --  4.5   Liver Function Tests:  Recent Labs Lab 03/12/16 0518 03/13/16 0500  AST 21 25  ALT 12* 11*  ALKPHOS 97 96  BILITOT 0.7 0.7  PROT 5.5* 5.3*  ALBUMIN 1.9* 1.8*   No results for input(s): LIPASE, AMYLASE in the last 168 hours. No results for input(s): AMMONIA in the last 168 hours. CBC:  Recent Labs Lab 03/07/16 0431 03/10/16 0625 03/11/16 1955 03/12/16 0518 03/13/16 0720  WBC 4.7 4.9 4.5 5.8 12.4*  HGB 7.9* 8.2* 8.6* 8.1* 9.6*  HCT 24.3* 24.9* 28.2* 25.8* 30.9*  MCV 83.5 83.5 85.5 84.9 83.7  PLT 130* 151 174 182 176   Cardiac Enzymes: No results for input(s): CKTOTAL, CKMB, CKMBINDEX, TROPONINI in the last 168 hours. BNP (last 3 results)  Recent Labs  03/11/16 2000  BNP >4500.0*    ProBNP (last 3 results) No results for input(s): PROBNP in the last 8760 hours.  CBG:  Recent Labs Lab 03/13/16 1501  GLUCAP 95    Recent Results (from the past 240 hour(s))  Culture, body fluid-bottle     Status: None   Collection Time: 03/06/16  9:35 AM  Result Value Ref Range Status   Specimen Description PLEURAL  Final   Special Requests NONE  Final   Gram Stain NO ORGANISMS SEEN  Final   Culture NO GROWTH 4 DAYS  Final   Report Status 03/10/2016 FINAL  Final     Studies: Dg Chest 2 View  03/12/2016  CLINICAL DATA:  Shortness of breath, history of CHF, mitral valve disease, Celsius OPD, dialysis dependent renal failure. EXAM: CHEST  2 VIEW COMPARISON:  Portable chest x-ray of March 10, 2016 FINDINGS: The left lung is mildly hyperinflated and clear. On the right there is mild volume loss due to a moderate-sized pleural effusion. There is right basilar atelectasis or pneumonia. The cardiac silhouette remains enlarged. The pulmonary vascularity is mildly engorged. The mediastinum is normal in width. The bony thorax exhibits no acute abnormality. IMPRESSION: COPD. Moderate-sized right pleural effusion and right  basilar atelectasis or pneumonia. CHF with mild pulmonary interstitial edema. Electronically Signed   By: David  SwazilandJordan M.D.   On: 03/12/2016 07:50   [facility]  [facility]  CLINICAL DATA:  Postoperative check up of right pleural effusion EXAM: PORTABLE CHEST 1 VIEW COMPARISON:  PA and lateral chest x-ray of March 12, 2016 FINDINGS: There has been interval placement of a small caliber right chest tube. The tip of the tube projects over the medial costophrenic angle. No pleural fluid is visible today. There is no pneumothorax. The left lung is adequately inflated and exhibits no pleural effusion. The cardiac silhouette remains enlarged. The central pulmonary vascularity remains prominent. IMPRESSION: Interval resolution of right pleural effusion following small caliber chest tube placement. Electronically Signed   By: David  [place] M.D.   On: 03/13/2016 15:29   Dg C-arm 1-60 Min-no Report  03/13/2016  CLINICAL DATA: effusion C-ARM 1-60 MINUTES Fluoroscopy was utilized by the requesting physician.  No radiographic interpretation.    Scheduled Meds:  Scheduled Meds: . aspirin  81 mg Oral Daily  . atorvastatin  80 mg Oral Daily  . calcitRIOL  0.5 mcg Oral Daily  . calcium acetate  2,001 mg Oral TID WC  . cinacalcet  30 mg Oral Q breakfast  . darbepoetin (ARANESP) injection - NON-DIALYSIS  150 mcg Subcutaneous Q Wed-1800  . dialysis solution 1.5% low-MG/low-CA   Intraperitoneal Q24H  . dialysis solution 1.5% low-MG/low-CA   Intraperitoneal 6 times per day  . gentamicin cream  1 application Topical Daily  . multivitamin  1 tablet Oral QHS  . naLOXone (NARCAN)  injection  0.4 mg Intravenous Once  . pantoprazole  40 mg Oral BID  . sodium chloride  250 mL Intravenous Once  . sodium chloride flush  3 mL Intravenous Q12H  . sodium chloride flush  3 mL Intravenous Q12H  . sodium chloride flush  3 mL Intravenous Q12H  . sodium chloride flush  3 mL Intravenous Q12H  . sodium chloride  2 g  Oral BID WC  . tiotropium  18 mcg Inhalation Daily   Continuous Infusions: . sodium chloride 10 mL/hr at 03/13/16 1314    Time spent on care of this patient: 35 min   Evaline Waltman, MD 03/13/2016, 5:27 PM  LOS: 2 days   [facility] Office  [phone] Pager - Text Page per [email or web address] If 7PM-7AM, please contact night-coverage [email or web address]

## 2016-03-13 NOTE — Progress Notes (Signed)
Received pre-op [facility] order however after reviewing surgical c/s note, pt is not appropriate for CR at this time. We could ed if she has a stent and receive a new order. Will sign off at this time. Dorothy ChickKristan Suzzane Quilter CES, ACSM 7:29 AM 03/13/2016

## 2016-03-13 NOTE — Progress Notes (Signed)
Patient Name: Dorothy Long Date of Encounter: 03/13/2016   SUBJECTIVE  Feeling well. No chest pain, sob or palpitations.   CURRENT MEDS . aspirin  81 mg Oral Daily  . atorvastatin  80 mg Oral Daily  . calcitRIOL  0.5 mcg Oral Daily  . calcium acetate  2,001 mg Oral TID WC  . cinacalcet  30 mg Oral Q breakfast  . darbepoetin (ARANESP) injection - NON-DIALYSIS  150 mcg Subcutaneous Q Wed-1800  . dialysis solution 1.5% low-MG/low-CA   Intraperitoneal Q24H  . dialysis solution 1.5% low-MG/low-CA   Intraperitoneal 6 times per day  . gentamicin cream  1 application Topical Daily  . multivitamin  1 tablet Oral QHS  . naLOXone (NARCAN)  injection  0.4 mg Intravenous Once  . pantoprazole  40 mg Oral BID  . sodium chloride  250 mL Intravenous Once  . sodium chloride flush  3 mL Intravenous Q12H  . sodium chloride flush  3 mL Intravenous Q12H  . sodium chloride flush  3 mL Intravenous Q12H  . sodium chloride  2 g Oral BID WC  . tiotropium  18 mcg Inhalation Daily    OBJECTIVE  Filed Vitals:   03/13/16 0353 03/13/16 0400 03/13/16 0815 03/13/16 0900  BP:  105/50  98/77  Pulse:  90  87  Temp:  97.7 F (36.5 C)  98.5 F (36.9 C)  TempSrc:  Oral  Oral  Resp:  18  18  Height:      Weight: 115 lb 8.3 oz (52.4 kg)     SpO2:  100% 99% 100%    Intake/Output Summary (Last 24 hours) at 03/13/16 1147 Last data filed at 03/13/16 0831  Gross per 24 hour  Intake   8240 ml  Output   5600 ml  Net   2640 ml   Filed Weights   03/11/16 1535 03/12/16 0551 03/13/16 0353  Weight: 111 lb 6.4 oz (50.531 kg) 115 lb 1.3 oz (52.2 kg) 115 lb 8.3 oz (52.4 kg)    PHYSICAL EXAM  General: Pleasant, NAD. Neuro: Alert and oriented X 3. Moves all extremities spontaneously. Psych: Normal affect. HEENT:  Normal  Neck: Supple without bruits or JVD. Lungs:  Resp regular and unlabored, CTA. Heart: RRR no s3, s4, or murmurs. Abdomen: Soft, non-tender, non-distended, BS + x 4.  Extremities: No  clubbing, cyanosis or edema. DP/PT/Radials 2+ and equal bilaterally. R groin site stable.   Accessory Clinical Findings  CBC  Recent Labs  03/12/16 0518 03/13/16 0720  WBC 5.8 12.4*  HGB 8.1* 9.6*  HCT 25.8* 30.9*  MCV 84.9 83.7  PLT 182 176   Basic Metabolic Panel  Recent Labs  03/11/16 1955 03/12/16 0518 03/13/16 0500  NA  --  134* 134*  K  --  4.0 3.6  CL  --  96* 97*  CO2  --  21* 20*  GLUCOSE  --  103* 133*  BUN  --  50* 44*  CREATININE 9.97* 9.81* 8.87*  CALCIUM  --  8.4* 7.7*  MG 1.8  --  1.7  PHOS 5.4*  --  4.5   Liver Function Tests  Recent Labs  03/12/16 0518 03/13/16 0500  AST 21 25  ALT 12* 11*  ALKPHOS 97 96  BILITOT 0.7 0.7  PROT 5.5* 5.3*  ALBUMIN 1.9* 1.8*   No results for input(s): LIPASE, AMYLASE in the last 72 hours. Cardiac Enzymes No results for input(s): CKTOTAL, CKMB, CKMBINDEX, TROPONINI in the last 72 hours. BNP  Invalid input(s): POCBNP D-Dimer No results for input(s): DDIMER in the last 72 hours. Hemoglobin A1C  Recent Labs  03/12/16 0518  HGBA1C 5.5   Right/Left Heart Cath and Coronary Angiography    Conclusion     Mid LAD lesion, 75% stenosed.  Mid RCA-1 lesion, 99% stenosed with left to right collaterals.  Overall normal left ventricular function. 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  Severe mitral stenosis. Using LV-PCWP tracing, mean gradient 15 mm Hg. MV area 1.1 cm 2  Moderate pulmonary hypertension. PA sat 59%. CO 5.3 L/min. CI 3.2.  IVC filter in place.  Continue with plans for CT surgery evaluation. For MVR and now CABG.    TELE  Sinus rhythm at rate of 80s, intermittently goes into 110s  Radiology/Studies  Dg Chest 1 View  03/06/2016  CLINICAL DATA:  Post right thoracentesis EXAM: CHEST 1 VIEW COMPARISON:  03/04/2016 FINDINGS: Marked cardiomegaly with central vascular congestion. Resolution of the right effusion following thoracentesis. No pneumothorax. Minor bibasilar atelectasis.  Heavy calcification noted of the mitral valve. Trachea is midline. IMPRESSION: Negative for pneumothorax following thoracentesis. Cardiomegaly without CHF Extensive mitral valve calcifications. Electronically Signed   By: Judie PetitM.  Shick M.D.   On: 03/06/2016 11:13   Dg Chest 2 View  03/12/2016  CLINICAL DATA:  Shortness of breath, history of CHF, mitral valve disease, Celsius OPD, dialysis dependent renal failure. EXAM: CHEST  2 VIEW COMPARISON:  Portable chest x-ray of March 10, 2016 FINDINGS: The left lung is mildly hyperinflated and clear. On the right there is mild volume loss due to a moderate-sized pleural effusion. There is right basilar atelectasis or pneumonia. The cardiac silhouette remains enlarged. The pulmonary vascularity is mildly engorged. The mediastinum is normal in width. The bony thorax exhibits no acute abnormality. IMPRESSION: COPD. Moderate-sized right pleural effusion and right basilar atelectasis or pneumonia. CHF with mild pulmonary interstitial edema. Electronically Signed   By: David  SwazilandJordan M.D.   On: 03/12/2016 07:50   Dg Chest 2 View  03/04/2016  CLINICAL DATA:  Right lower leg pain and swelling for 5 days. Right-sided chest pain starting yesterday. EXAM: CHEST  2 VIEW COMPARISON:  12/18/2015 FINDINGS: Cardiac enlargement with mild central pulmonary vascular congestion suggesting arterial hypertension. Calcification projected over the heart corresponding calcification in the mitral valve annulus on prior CT. Mediastinal contours appear intact. There is increasing right pleural effusion with basilar atelectasis or consolidation in the right lung since the previous study. No pneumothorax. Vena caval filter and surgical clips demonstrated in the right upper quadrant. IMPRESSION: Cardiac enlargement with central pulmonary vascular congestion. Increasing right pleural effusion with basilar infiltration or atelectasis. Electronically Signed   By: Burman NievesWilliam  Stevens M.D.   On: 03/04/2016 19:40    Ct Chest Wo Contrast  03/05/2016  CLINICAL DATA:  End-stage renal disease with right pleural effusion and chronic congestive heart failure. EXAM: CT CHEST WITHOUT CONTRAST TECHNIQUE: Multidetector CT imaging of the chest was performed following the standard protocol without IV contrast. COMPARISON:  Chest CT February 08, 2016; chest radiograph March 04, 2016 FINDINGS: Mediastinum/Lymph Nodes: Thyroid appears unremarkable. There are subcentimeter mediastinal lymph nodes but no adenopathy by size criteria. There is no appreciable thoracic aortic aneurysm. Visualized great vessels appear unremarkable. There is generalized cardiomegaly with primarily right heart enlargement. There is a small pericardial effusion. There are multiple foci of coronary artery calcification. There is extensive mitral valve calcification. There is prominence of the central pulmonary arteries with rapid peripheral tapering. Lungs/Pleura: There remains  a sizable right pleural effusion with atelectatic change in the right lower lobe. There is scarring in the lung bases. There is a rather minimal left pleural effusion with mild left base atelectasis. The somewhat nodular appearing opacity on axial images in the medial left base on prior study appears consistent with localized atelectasis at this time. Upper abdomen: In the visualized upper abdomen, kidneys are atrophic. Liver has a somewhat nodular contour, concerning for a degree of underlying cirrhosis. There is a filter in the inferior vena cava. There is calcification in the visualized abdominal aorta. Gallbladder is absent. Musculoskeletal: Bones are somewhat sclerotic consistent with chronic renal failure. No lytic bone lesions are evident. IMPRESSION: There is a persistent sizable right pleural effusion with right base atelectasis. There is a minimal left pleural effusion with patchy scarring and atelectasis in the left base region. Small pericardial effusion. Changes indicative of a  degree of cor pulmonale, stable. Extensive mitral valve and annulus calcification. There are multiple foci of coronary artery calcification. No adenopathy. Bony changes of chronic renal failure. Filter in the inferior vena cava. Kidneys atrophic. Liver has a somewhat nodular contour, concerning for a degree of underlying hepatic cirrhosis. Electronically Signed   By: Bretta Bang III M.D.   On: 03/05/2016 15:47   US Venous Img Lower Unilateral Left  03/06/2016  CLINICAL DATA:  Shortness of breath.  History of DVT. EXAM: LEFT LOWER EXTREMITY VENOUS DOPPLER ULTRASOUND TECHNIQUE: Gray-scale sonography with graded compression, as well as color Doppler and duplex ultrasound were performed to evaluate the lower extremity deep venous systems from the level of the common femoral vein and including the common femoral, femoral, profunda femoral, popliteal and calf veins including the posterior tibial, peroneal and gastrocnemius veins when visible. The superficial great saphenous vein was also interrogated. Spectral Doppler was utilized to evaluate flow at rest and with distal augmentation maneuvers in the common femoral, femoral and popliteal veins. COMPARISON:  None. FINDINGS: Contralateral Common Femoral Vein: Respiratory phasicity is normal and symmetric with the symptomatic side. No evidence of thrombus. Normal compressibility. Common Femoral Vein: No evidence of thrombus. Normal compressibility, respiratory phasicity and response to augmentation. Saphenofemoral Junction: No evidence of thrombus. Normal compressibility and flow on color Doppler imaging. Profunda Femoral Vein: No evidence of thrombus. Normal compressibility and flow on color Doppler imaging. Femoral Vein: No evidence of thrombus. Normal compressibility, respiratory phasicity and response to augmentation. Popliteal Vein: No evidence of thrombus. Normal compressibility, respiratory phasicity and response to augmentation. Calf Veins: No evidence of  thrombus. Normal compressibility and flow on color Doppler imaging. Superficial Great Saphenous Vein: No evidence of thrombus. Normal compressibility and flow on color Doppler imaging. Other Findings:  None. IMPRESSION: No evidence of deep venous thrombosis. Electronically Signed   By: Maisie Fus  Register   On: 03/06/2016 11:20   US Venous Img Lower Unilateral Right  03/04/2016  CLINICAL DATA:  Pain and swelling in the right leg for 2 weeks. Previous history of deep venous thrombosis. Aspirin anticoagulation therapy. EXAM: Right LOWER EXTREMITY VENOUS DOPPLER ULTRASOUND TECHNIQUE: Gray-scale sonography with graded compression, as well as color Doppler and duplex ultrasound were performed to evaluate the lower extremity deep venous systems from the level of the common femoral vein and including the common femoral, femoral, profunda femoral, popliteal and calf veins including the posterior tibial, peroneal and gastrocnemius veins when visible. The superficial great saphenous vein was also interrogated. Spectral Doppler was utilized to evaluate flow at rest and with distal augmentation maneuvers in the common  femoral, femoral and popliteal veins. COMPARISON:  None. FINDINGS: Contralateral Common Femoral Vein: Respiratory phasicity is normal and symmetric with the symptomatic side. No evidence of thrombus. Normal compressibility. Common Femoral Vein: No evidence of thrombus. Normal compressibility, respiratory phasicity and response to augmentation. Saphenofemoral Junction: No evidence of thrombus. Normal compressibility and flow on color Doppler imaging. Profunda Femoral Vein: No evidence of thrombus. Normal compressibility and flow on color Doppler imaging. Femoral Vein: No evidence of thrombus. Normal compressibility, respiratory phasicity and response to augmentation. Popliteal Vein: No evidence of thrombus. Normal compressibility, respiratory phasicity and response to augmentation. Calf Veins: No evidence of  thrombus. Normal compressibility and flow on color Doppler imaging. Superficial Great Saphenous Vein: No evidence of thrombus. Normal compressibility and flow on color Doppler imaging. Venous Reflux:  None. Other Findings:  None. IMPRESSION: No evidence of deep venous thrombosis. Electronically Signed   By: Burman Nieves M.D.   On: 03/04/2016 19:31   Dg Chest Port 1 View  03/10/2016  CLINICAL DATA:  Chest pain, COPD, CHF EXAM: PORTABLE CHEST 1 VIEW COMPARISON:  03/06/2016 FINDINGS: There is a small right pleural effusion with basilar airspace disease. There is no other focal parenchymal opacity. There is no left pleural effusion. There is no pneumothorax. There is stable cardiomegaly. The osseous structures are unremarkable. IMPRESSION: 1. Small right pleural effusion with basilar airspace disease which may reflect atelectasis versus pneumonia. 2. Stable cardiomegaly. Electronically Signed   By: Elige Ko   On: 03/10/2016 13:49   US Thoracentesis Asp Pleural Space W/img Guide  03/06/2016  INDICATION: Shortness of breath, large right pleural effusion by CT EXAM: ULTRASOUND GUIDED RIGHT THORACENTESIS MEDICATIONS: 1% lidocaine locally COMPLICATIONS: None immediate. PROCEDURE: An ultrasound guided thoracentesis was thoroughly discussed with the patient and questions answered. The benefits, risks, alternatives and complications were also discussed. The patient understands and wishes to proceed with the procedure. Written consent was obtained. Ultrasound was performed to localize and mark an adequate pocket of fluid in the right chest. The area was then prepped and draped in the normal sterile fashion. 1% Lidocaine was used for local anesthesia. Under ultrasound guidance a 6 Fr Safe-T-Centesis catheter was introduced. Thoracentesis was performed. The catheter was removed and a dressing applied. FINDINGS: A total of approximately 1.3 L of clear pleural fluid was removed. Samples were sent to the laboratory as  requested by the clinical team. IMPRESSION: Successful ultrasound guided right thoracentesis yielding 1.3 L of pleural fluid. Electronically Signed   By: Judie Petit.  Shick M.D.   On: 03/06/2016 09:53    ASSESSMENT AND PLAN  55 y.o. AA female with severe mitral valve stenosis, end-stage renal disease on hemodialysis, history of stroke,who presented to [facility] 03/04/16 with CHF and chest pain. Cardiology consult for her chest pain symptoms and significant coronary and aortic atherosclerosis seen on CT scan.TEE this admission with severe mitral valve stenosis likely contributing to her pleural effusion, shortness of breath, difficulty with dialysis. She also had a pleural effusion on Rt requiring a tap. She was transferred to [facility] 03/11/16 for Rt and Lt cath and further evaluation.   1. Severe mitral stenosis/acute diastolic heart failure -Right and left heart catheterization showed  multivessel coronary artery disease with 99% subtotal occlusion of the mid right coronary artery with left-to-right collaterals filling the distal portion of the vessel. There was a tubular stenosis of the mid left anterior descending coronary artery that was graded 75%. There was normal left ventricular systolic function. Severe mitral stenosis. Using LV-PCWP tracing, mean  gradient 15 mm Hg. MV area 1.1 cm 2 --Evaluated by cardiothoracic surgery and felt not good candidate for surgery.   2. CAD --As above. Does not recommended PCI currently. Can consider PCI and stenting at later s/s of coronary ischemia. Poor prognosis.   3. End-stage renal disease -Hemodialysis  4. Pleural effusion -In part secondary to mitral stenosis causing elevated left atrial pressure. Plan for Pleurx catheter placement today.   5. Paroxysmal atrial fibrillation --Maintaining  sinus rhythm, rate stable. Has IVC filter.      Signed, Manson Passey PA-C Pager [phone]  Personally seen and examined. Agree with above. Still feeling sluggish from  anesthesia Discussed with family plans (no surgery, increase strength, protein) If significant angina develops, consider PCI to RCA. I would be very hesitant however given high bleeding risk.  -Pleurex cath for palliation -Tele personally viewed - stable.   Donato Schultz, MD

## 2016-03-13 NOTE — Progress Notes (Signed)
Unable to perform Pt's 1200 PD exchange. Pt transferred to OR. Report given to short stay RN.

## 2016-03-13 NOTE — Anesthesia Preprocedure Evaluation (Addendum)
Anesthesia Evaluation  Patient identified by MRN, date of birth, ID band Patient awake    Reviewed: Allergy & Precautions, NPO status , Patient's Chart, lab work & pertinent test results  History of Anesthesia Complications (+) PONV  Airway Mallampati: II  TM Distance: >3 FB Neck ROM: Full    Dental  (+) Poor Dentition, Missing, Chipped   Pulmonary shortness of breath, COPD, Current Smoker, former smoker,     + decreased breath sounds      Cardiovascular Exercise Tolerance: Poor hypertension, Pt. on medications and Pt. on home beta blockers + CAD, + Past MI, + Peripheral Vascular Disease and +CHF  + Valvular Problems/Murmurs  Rhythm:Regular Rate:Normal  Severe ms   Neuro/Psych    GI/Hepatic GERD  Controlled and Medicated,  Endo/Other  diabetes  Renal/GU ESRF and DialysisRenal disease     Musculoskeletal   Abdominal (+)  Abdomen: soft.    Peds  Hematology  (+) anemia ,   Anesthesia Other Findings   Reproductive/Obstetrics                            Anesthesia Physical Anesthesia Plan  ASA: IV  Anesthesia Plan: MAC   Post-op Pain Management:    Induction: Intravenous  Airway Management Planned: Nasal Cannula  Additional Equipment: None  Intra-op Plan:   Post-operative Plan:   Informed Consent: I have reviewed the patients History and Physical, chart, labs and discussed the procedure including the risks, benefits and alternatives for the proposed anesthesia with the patient or authorized representative who has indicated his/her understanding and acceptance.   Dental advisory given  Plan Discussed with: CRNA and Surgeon  Anesthesia Plan Comments:        Anesthesia Quick Evaluation

## 2016-03-13 NOTE — Op Note (Signed)
CARDIOTHORACIC SURGERY OPERATIVE NOTE  Date of Procedure:  03/13/2016  Preoperative Diagnosis: Recurrent Right Pleural Effusion   Postoperative Diagnosis: Same  Procedure:   Placement of Right Pleur-X Catheter  Surgeon:   Salvatore Decentlarence H. Cornelius Moraswen, MD  Anesthesia:   1% lidocaine local with intravenous sedation (MAC)      DETAILS OF THE OPERATIVE PROCEDURE  Following full informed consent the patient was brought to the operating room where intravenous sedation was administered and the patient continuously monitored by the anesthesia staff. The right chest was prepared and draped in a sterile manner. A timeout procedure was performed.  1% lidocaine was utilized to anesthetize the skin and subcutaneous tissues. A small stab incision was made and an angiocath placed into the right pleural space near the posterior axillary line. A guidewire was advanced through the angiocath and positioned in the posterior pleural space using fluoroscopic guidance.  A Pleur-X catheter was tunneled from a second stab incision located more anteriorly to the posterior stab incision.  Dilating catheters and an introducing sheath were passed over the guidewire.  The guidewire and dilating catheter were removed and the Pleur-X catheter advanced through the introducing sheath into the pleural space.  The sheath was removed and the final position of the catheter verified using fluoroscopy.  The catheter was secured to the skin and connected to a closed suction vacuum bottle for drainage. A total of 1250 mL of clear serous fluid was evacuated.  The patient tolerated the procedure well and was transported to the PACU in stable condition.  There were no complications.    Salvatore Decentlarence H. Cornelius Moraswen, MD 03/13/2016 3:01 PM

## 2016-03-13 NOTE — Clinical Documentation Improvement (Signed)
Internal Medicine  Can the diagnosis of CHF, acute on chronic  be further specified?    Type - Systolic, Diastolic, Systolic and Diastolic  Other  Clinically Undetermined   Document any associated diagnoses/conditions  Supporting Information: WUJ:[id number]:chronic combines systolic and diastolic CHF   03/12/16 provider note: CHF, acute on chronic secondary to valvular heart disease Congestive heart failure (CHF) (HCC) Cardiac cath,   Please exercise your independent, professional judgment when responding. A specific answer is not anticipated or expected. Please update your documentation within the medical record to reflect your response to this query. Thank you Thank Barrie DunkerYou,  Jesiel Garate C Alisi Lupien [employer] Information Management [place] [phone]

## 2016-03-13 NOTE — Transfer of Care (Signed)
Immediate Anesthesia Transfer of Care Note  Patient: Dorothy Long  Procedure(s) Performed: Procedure(s): INSERTION PLEURAL DRAINAGE CATHETER (Right)  Patient Location: PACU  Anesthesia Type:MAC  Level of Consciousness: awake, alert , oriented and patient cooperative  Airway & Oxygen Therapy: Patient Spontanous Breathing and Patient connected to face mask oxygen  Post-op Assessment: Report given to RN, Post -op Vital signs reviewed and stable and Patient moving all extremities  Post vital signs: Reviewed and stable  Last Vitals:  Filed Vitals:   03/13/16 0900 03/13/16 1456  BP: 98/77 92/81  Pulse: 87 87  Temp: 36.9 C 36.7 C  Resp: 18 14    Complications: No apparent anesthesia complications

## 2016-03-13 NOTE — Progress Notes (Signed)
Subjective: Interval History: has no complaint .  Objective: Vital signs in last 24 hours: Temp:  [97.7 F (36.5 C)-98.5 F (36.9 C)] 98.5 F (36.9 C) (03/30 0900) Pulse Rate:  [63-110] 87 (03/30 0900) Resp:  [11-23] 18 (03/30 0900) BP: (65-153)/(46-103) 98/77 mmHg (03/30 0900) SpO2:  [0 %-100 %] 100 % (03/30 0900) Weight:  [52.4 kg (115 lb 8.3 oz)] 52.4 kg (115 lb 8.3 oz) (03/30 0353) Weight change: 1.869 kg (4 lb 1.9 oz)  Intake/Output from previous day: 03/29 0701 - 03/30 0700 In: 8240 [P.O.:240] Out: 5500  Intake/Output this shift: Total I/O In: 1600 [Other:1600] Out: 1800 [Other:1800]  General appearance: alert, cooperative and cachectic Resp: diminished breath sounds RLL and rales RML Cardio: S1, S2 normal, systolic murmur: holosystolic 2/6, blowing at apex and diastolic murmur: mid diastolic 1/6, blowing at apex GI: pos FW, soft, PD cath in place Extremities: extremities normal, atraumatic, no cyanosis or edema  Lab Results:  Recent Labs  03/12/16 0518 03/13/16 0720  WBC 5.8 12.4*  HGB 8.1* 9.6*  HCT 25.8* 30.9*  PLT 182 176   BMET:  Recent Labs  03/12/16 0518 03/13/16 0500  NA 134* 134*  K 4.0 3.6  CL 96* 97*  CO2 21* 20*  GLUCOSE 103* 133*  BUN 50* 44*  CREATININE 9.81* 8.87*  CALCIUM 8.4* 7.7*    Recent Labs  03/12/16 1108  PTH 79*   Iron Studies:  Recent Labs  03/12/16 0518  IRON 51  TIBC 221*    Studies/Results: Dg Chest 2 View  03/12/2016  CLINICAL DATA:  Shortness of breath, history of CHF, mitral valve disease, Celsius OPD, dialysis dependent renal failure. EXAM: CHEST  2 VIEW COMPARISON:  Portable chest x-ray of March 10, 2016 FINDINGS: The left lung is mildly hyperinflated and clear. On the right there is mild volume loss due to a moderate-sized pleural effusion. There is right basilar atelectasis or pneumonia. The cardiac silhouette remains enlarged. The pulmonary vascularity is mildly engorged. The mediastinum is normal in  width. The bony thorax exhibits no acute abnormality. IMPRESSION: COPD. Moderate-sized right pleural effusion and right basilar atelectasis or pneumonia. CHF with mild pulmonary interstitial edema. Electronically Signed   By: David  Swazil[place]Jordan M.D.   On: 03/12/2016 07:50    I have reviewed the patient's current medications.  Assessment/Plan: 1 ESRD PD going well. BP low,use 1.5% 2 Anemia esa, Fe 3 HPTH cinnacalcet 4 cachexia/malnutrition  suppl 5 effusion to get pleurex, follow fluid  ? PD leak discussed with Dr. Cornelius Moraswen 6 MS/MR P PD, pleurex, Fe, esa     LOS: 2 days   Barnes Florek L 03/13/2016,11:10 AM

## 2016-03-13 NOTE — Progress Notes (Signed)
Pt arrived back to unit from PACU. A&OX4. Right chest dressing dry,intact, no complications noted. No complaints of pain or discomfort. Will continue to assess and monitor pt.

## 2016-03-13 NOTE — Telephone Encounter (Signed)
LM on AM for pt to call back to schedule f/u appt.

## 2016-03-13 NOTE — Progress Notes (Signed)
PT Cancellation Note  Patient Details Name: Dorothy GarretDonnella R Long MRN: [id number] DOB: Apr 27, 1961   Cancelled Treatment:    Reason Eval/Treat Not Completed: Patient declined, no reason specified Declines to work with therapy at this time. Does not give a reason despite explaining PT role and need for assessment to make recommendations for discharge planning. Seems agitated and pre-occupied with her iPad. Will follow-up as time allows. Likely tomorrow.  Berton MountBarbour, Alexsander Cavins S 03/13/2016, 10:49 AM  Charlsie MerlesLogan Secor Jannice Beitzel, PT [phone]

## 2016-03-14 ENCOUNTER — Telehealth: Payer: Self-pay | Admitting: Internal Medicine

## 2016-03-14 ENCOUNTER — Encounter (HOSPITAL_COMMUNITY): Payer: BC Managed Care – PPO

## 2016-03-14 ENCOUNTER — Telehealth: Payer: Self-pay

## 2016-03-14 ENCOUNTER — Encounter (HOSPITAL_COMMUNITY): Payer: Self-pay | Admitting: Thoracic Surgery (Cardiothoracic Vascular Surgery)

## 2016-03-14 LAB — TRIGLYCERIDES, BODY FLUIDS: Triglycerides, Fluid: <9 mg/dL

## 2016-03-14 LAB — MAGNESIUM: Magnesium: 1.5 mg/dL — ABNORMAL LOW (ref 1.7–2.4)

## 2016-03-14 LAB — CHOLESTEROL, BODY FLUID: Cholesterol, Fluid: 16 mg/dL

## 2016-03-14 LAB — GLUCOSE, SEROUS FLUID: Glucose, Fluid: 412 mg/dL

## 2016-03-14 LAB — GLUCOSE, CAPILLARY: GLUCOSE-CAPILLARY: 118 mg/dL — AB (ref 65–99)

## 2016-03-14 MED ORDER — MAGNESIUM OXIDE 400 (241.3 MG) MG PO TABS
400.0000 mg | ORAL_TABLET | Freq: Every day | ORAL | Status: DC
  Administered 2016-03-14: 400 mg via ORAL
  Filled 2016-03-14: qty 1

## 2016-03-14 MED ORDER — MAGNESIUM OXIDE 400 (241.3 MG) MG PO TABS: 400.0000 mg | ORAL_TABLET | Freq: Every day | ORAL | Status: AC

## 2016-03-14 MED ORDER — OXYCODONE HCL 5 MG PO TABS: 5.0000 mg | ORAL_TABLET | Freq: Four times a day (QID) | ORAL | Status: DC | PRN

## 2016-03-14 NOTE — Telephone Encounter (Signed)
HFU, Pt is being discharged today from the hospital. Dx Heart failure. Pt has a scheduled appt for 03/17/2016 @ 8am. Thank you!

## 2016-03-14 NOTE — Discharge Instructions (Signed)
Dx Heart failure. Pt has a scheduled appt for 03/17/2016 @ 8am

## 2016-03-14 NOTE — Progress Notes (Signed)
Asked the patient if she want to the PD before she goes home but patient said that she will do it at home.  Didn't disconnect the connector for the [employer] and told her to disconnect it after she goes home.

## 2016-03-14 NOTE — Telephone Encounter (Signed)
Noted  

## 2016-03-14 NOTE — Telephone Encounter (Signed)
Transition Care Management Follow-up Telephone Call   Date discharged? 03/14/16   How have you been since you were released from the hospital? Not feeling too well. Extremely fatigued.  Eating/drinking without issues.  No chest pain.   Do you understand why you were in the hospital? YES, chest pain, right sided pleural effusion.    Do you understand the discharge instructions? YES, increasing activity slowly, resting.   Where were you discharged to? Home   Items Reviewed:  Medications reviewed: YES, taking all scheduled medications without issues.  Allergies reviewed: YES, no changes.  Dietary changes reviewed: YES, low sodium.  Referrals reviewed: YES, appointment scheduled with PCP.   Functional Questionnaire:   Activities of Daily Living (ADLs):   She states they are independent in the following: Self feeding, grooming, toileting. States they require assistance with the following: Ambulating (use of wheelchair), bathing, dressing, meal prep.  Husband assists.  HH in progress.   Any transportation issues/concerns?: NO, husband will drive.   Any patient concerns? None at this time.   Confirmed importance and date/time of follow-up visits scheduled YES, appointment scheduled 03/17/16.  Provider Appointment booked with Dr. Darrick Huntsmanullo (PCP).  Confirmed with patient if condition begins to worsen call PCP or go to the ER.  Patient was given the office number and encouraged to call back with question or concerns.  : YES, verbalized understanding.

## 2016-03-14 NOTE — Evaluation (Signed)
Physical Therapy Evaluation Patient Details Name: Dorothy Long MRN: [id number] DOB: 1961/04/25 Today'Long Date: 03/14/2016   History of Present Illness   Pt is a 55 y.o. 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 Pt transferred to [facility] for valve replacement by CTS after cardiac clearance (cardiac Catheterization). 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  Clinical Impression  Pt admitted with above diagnosis. Pt currently with functional limitations due to the deficits listed below (see PT Problem List). Reports she was ambulatory for short distance up until approx 1-2 weeks ago in the house. Today only tolerated short distance of 8 feet with min assist for balance frequently. Has been getting into w/c with family at home and making progress with HHPT. Would benefit from HHPT at d/c.  Will follow and progress until d/c.      Follow Up Recommendations Home health PT;Supervision for mobility/OOB    Equipment Recommendations  None recommended by PT    Recommendations for Other Services       Precautions / Restrictions Precautions Precautions: Fall Restrictions Weight Bearing Restrictions: No      Mobility  Bed Mobility Overal bed mobility: Needs Assistance Bed Mobility: Rolling;Sidelying to Sit Rolling: Supervision Sidelying to sit: Supervision Supine to sit: Min assist     General bed mobility comments: Required extra time and use of rail but able to complete without physical assist. Cues for technique.  Transfers Overall transfer level: Needs assistance Equipment used: Rolling walker (2 wheeled) Transfers: Sit to/from Stand Sit to Stand: Min assist         General transfer comment: Min assist for boost and balance from bed. Vc for hand placement.    Ambulation/Gait Ambulation/Gait assistance: Min assist Ambulation Distance (Feet): 8 Feet Assistive device: Rolling walker (2 wheeled) Gait Pattern/deviations: Step-to pattern;Decreased step length - right;Decreased step length - left;Decreased stance time - right;Decreased stride length;Decreased dorsiflexion - right;Shuffle;Narrow base of support Gait velocity: decreased Gait velocity interpretation: <1.8 ft/sec, indicative of risk for recurrent falls General Gait Details: Educated on safe DME use with a rolling walker. VC for Rt foot clearance and to widen base of support. Required min assist on several occasions due to posterior loss of balance. Good RW control when ambulating straight ahead but required assist to turn. Hemiparetic gait pattern. Cues for safety throughout.  Stairs            Wheelchair Mobility    Modified Rankin (Stroke Patients Only)       Balance Overall balance assessment: Needs assistance Sitting-balance support: No upper extremity supported;Feet supported Sitting balance-Leahy Scale: Good     Standing balance support: Bilateral upper extremity supported Standing balance-Leahy Scale: Poor                               Pertinent Vitals/Pain Pain Assessment: No/denies pain    Home Living Family/patient expects to be discharged to:: Private residence Living Arrangements: Spouse/significant other;Children Available Help at Discharge: Family Type of Home: House Home Access: Ramped entrance     Home Layout: One level Home Equipment: Wheelchair - [employer] - 2 wheels      Prior Function Level of Independence: Needs assistance   Gait / Transfers Assistance Needed: Pt husband states tht family assists with transfers (SPT  to/from bed to chair/w/c, otherwise pt in bed) they use manual w/c for activities/appointments out of the house. Reports pt lost ability to ambulate approx 1-2 weeks ago, she was ambulating short distances until  this point.  ADL'Long / Homemaking Assistance Needed: Assist with ADL'Long and home making. Bathing, dressing, wears depends and family assists in changing and hygiene.         Hand Dominance   Dominant Hand: Left (previously Rt handed until CVA)    Extremity/Trunk Assessment   Upper Extremity Assessment: Defer to OT evaluation           Lower Extremity Assessment: RLE deficits/detail RLE Deficits / Details: Hx of CVA, notable weakness compared to Lt, also edematous foot.       Communication   Communication: Expressive difficulties  Cognition Arousal/Alertness: Awake/alert Behavior During Therapy: WFL for tasks assessed/performed Overall Cognitive Status: No family/caregiver present to determine baseline cognitive functioning                      General Comments General comments (skin integrity, edema, etc.): Poor waveform however SpO2 97% at rest, and 88% with short distance ambulation, improved with pursed lip breathing, but again, questionable waveform on pulseox.    Exercises        Assessment/Plan    PT Assessment Patient needs continued PT services  PT Diagnosis Difficulty walking;Abnormality of gait (Hx of hemiplegia)   PT Problem List Decreased strength;Decreased range of motion;Decreased activity tolerance;Decreased balance;Decreased mobility;Decreased coordination;Decreased cognition;Decreased knowledge of use of DME;Cardiopulmonary status limiting activity  PT Treatment Interventions DME instruction;Gait training;Functional mobility training;Therapeutic activities;Therapeutic exercise;Balance training;Cognitive remediation;Neuromuscular re-education;Patient/family education   PT Goals (Current goals can be found in the Care Plan section) Acute Rehab PT Goals Patient Stated Goal: Home when able PT Goal Formulation: With patient Time For Goal Achievement: 03/28/16 Potential to Achieve Goals: Good    Frequency Min 3X/week   Barriers to discharge         Co-evaluation               End of Session Equipment Utilized During Treatment: Gait belt Activity Tolerance: Patient limited by fatigue Patient left: in bed;with call bell/phone within reach;with bed alarm set Nurse Communication: Mobility status         Time: [id number] PT Time Calculation (min) (ACUTE ONLY): 16 min   Charges:   PT Evaluation $PT Eval Moderate Complexity: 1 Procedure     PT G CodesBerton Long:        Dorothy Long 03/14/2016, 1:12 PM  Dorothy Long, [place] CarolinaPT [phone]

## 2016-03-14 NOTE — Progress Notes (Signed)
Subjective: Interval History: has complaints wants to go home.  Objective: Vital signs in last 24 hours: Temp:  [97.4 F (36.3 C)-98.4 F (36.9 C)] 98 F (36.7 C) (03/31 0840) Pulse Rate:  [83-110] 84 (03/31 0840) Resp:  [9-18] 14 (03/31 0840) BP: (85-121)/(52-91) 110/87 mmHg (03/31 0840) SpO2:  [95 %-100 %] 98 % (03/31 0849) Weight:  [50.1 kg (110 lb 7.2 oz)] 50.1 kg (110 lb 7.2 oz) (03/30 2130) Weight change: -2.3 kg (-5 lb 1.1 oz)  Intake/Output from previous day: 03/30 0701 - 03/31 0700 In: 6800 [I.V.:400] Out: 7102 [Blood:2; Chest Tube:200] Intake/Output this shift: Total I/O In: 220 [P.O.:220] Out: -   General appearance: alert and ? confused Resp: rales bibasilar Cardio: S1, S2 normal, systolic murmur: holosystolic 2/6, blowing at apex and diastolic murmur: mid diastolic 2/6, blowing at apex GI: pos bs, pos FW, PD cath ES ok. Extremities: cachectic  Lab Results:  Recent Labs  03/12/16 0518 03/13/16 0720  WBC 5.8 12.4*  HGB 8.1* 9.6*  HCT 25.8* 30.9*  PLT 182 176   BMET:  Recent Labs  03/12/16 0518 03/13/16 0500  NA 134* 134*  K 4.0 3.6  CL 96* 97*  CO2 21* 20*  GLUCOSE 103* 133*  BUN 50* 44*  CREATININE 9.81* 8.87*  CALCIUM 8.4* 7.7*    Recent Labs  03/12/16 1108  PTH 79*   Iron Studies:  Recent Labs  03/12/16 0518  IRON 51  TIBC 221*    Studies/Results: Dg Chest Port 1 View  03/13/2016  CLINICAL DATA:  Postoperative check up of right pleural effusion EXAM: PORTABLE CHEST 1 VIEW COMPARISON:  PA and lateral chest x-ray of March 12, 2016 FINDINGS: There has been interval placement of a small caliber right chest tube. The tip of the tube projects over the medial costophrenic angle. No pleural fluid is visible today. There is no pneumothorax. The left lung is adequately inflated and exhibits no pleural effusion. The cardiac silhouette remains enlarged. The central pulmonary vascularity remains prominent. IMPRESSION: Interval resolution of  right pleural effusion following small caliber chest tube placement. Electronically Signed   By: David  SwazilandJordan M.D.   On: 03/13/2016 15:29   Dg C-arm 1-60 Min-no Report  03/13/2016  CLINICAL DATA: effusion C-ARM 1-60 MINUTES Fluoroscopy was utilized by the requesting physician.  No radiographic interpretation.    I have reviewed the patient's current medications.  Assessment/Plan: 1 ESRD PD going well . Check pleural fluid glucose 2 Anemia esa/Fe 3 Mv dz per cards 4 Cachexia 5 CVA 6 HPTH P PD, check pleural fluid glu    LOS: 3 days   Gunnar Hereford L 03/14/2016,10:25 AM

## 2016-03-14 NOTE — Progress Notes (Signed)
PT Cancellation Note  Patient Details Name: Dorothy GarretDonnella R Long MRN: [id number] DOB: 07-May-1961   Cancelled Treatment:    Reason Eval/Treat Not Completed: Patient at procedure or test/unavailable Just attached to PD. Will check back later.  Dorothy Long, Dorothy Long 03/14/2016, 11:41 AM  Dorothy Long, PT [phone]

## 2016-03-14 NOTE — Progress Notes (Signed)
Occupational Therapy Treatment Patient Details Name: Dorothy GarretDonnella R Zimmers MRN: [id number] DOB: 1961-01-19 Today's Date: 03/14/2016    History of present illness  Pt is a 55 y.o. 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 [facility] by [facility]TS after cardiac clearance (cardiac Catheterization). 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   OT comments  Patient agreeable to EOB ADLs this session. Making slow progress. Gets frustrated with her expressive aphasia. OT will continue to follow.   Follow Up Recommendations  Home health OT;Supervision/Assistance - 24 hour    Equipment Recommendations  None recommended by OT    Recommendations for Other Services      Precautions / Restrictions Precautions Precautions: Fall Restrictions Weight Bearing Restrictions: No       Mobility Bed Mobility Overal bed mobility: Needs Assistance Bed Mobility: Supine to Sit     Supine to sit: Min assist     General bed mobility comments: needed assistance managing R leg, which has increased tone  Transfers                      Balance                                   ADL Overall ADL's : Needs assistance/impaired     Grooming: Wash/dry hands;Wash/dry face;Oral care;Brushing hair;Minimal assistance;Sitting Grooming Details (indicate cue type and reason): sitting EOB, using RUE as gross assist                               General ADL Comments: Patient received attempting to dial phone but having difficulty. Assisted pt to call her husband, then she had difficulty communicating with him due to expressive aphasia. Patient agreeable to sit EOB and perform grooming ADLs. Suggested standing/transfer to recliner and she refused. Patient  returned to supine at end of session.      Vision                     Perception     Praxis      Cognition   Behavior During Therapy: WFL for tasks assessed/performed Overall Cognitive Status: No family/caregiver present to determine baseline cognitive functioning                       Extremity/Trunk Assessment               Exercises     Shoulder Instructions       General Comments      Pertinent Vitals/ Pain       Pain Assessment: No/denies pain  Home Living                                          Prior Functioning/Environment              Frequency Min 2X/week     Progress Toward Goals  OT Goals(current goals can now be found in the care plan section)  Progress towards OT goals: Progressing toward goals  Plan Discharge plan remains appropriate    Co-evaluation                 End of Session     Activity Tolerance Patient tolerated treatment well   Patient Left in bed;with call bell/phone within reach;with bed alarm set;with family/visitor present;Other (comment)   Nurse Communication          Time: 7846-9629 OT Time Calculation (min): 24 min  Charges: OT General Charges $OT Visit: 1 Procedure OT Treatments $Self Care/Home Management : 8-22 mins $Therapeutic Activity: 8-22 mins  Lindsi Bayliss A 03/14/2016, 12:38 PM

## 2016-03-14 NOTE — Care Management Note (Signed)
Case Management Note  Patient Details  Name: Dorothy Long MRN: [id number] Date of Birth: May 19, 1961  Subjective/Objective:       CM following for progression and d/c planning.             Action/Plan: 03/14/2016 Noted order for [facility] services for care of Pleux drain. [facility] , however per that office they are unable to provide services for this pt until next week and this pt requires drain to be changed qother day. This was discussed with the pt who [facility] at [facility]. Application for ongoing supply of drainage bottles completed and fax[place] [facility] [place] [place] Assist director of [address] assisted with obtaining a box of these bottles to d/c to home with the pt as her supplies were not delivered from the OR when the pt was received post procedure 03/13/16. [facility] notified of pt need for vacuum bottles to be changed qod and has agreed to provide this services. They will see the pt prior to her d/c.   Expected Discharge Date:       03/14/2016           Expected Discharge Plan:  Home w Home Health Services  In-House Referral:  NA  Discharge planning Services  CM Consult  Post Acute Care Choice:  Durable Medical Equipment Choice offered to:  Patient  DME Arranged:    DME Agency:     HH Arranged:  RN HH Agency:  [facility]  Status of Service:  Completed, signed off  Medicare Important Message Given:    Date Medicare IM Given:    Medicare IM give by:    Date Additional Medicare IM Given:    Additional Medicare Important Message give by:     If discussed at Long Length of Stay Meetings, dates discussed:    Additional Comments:  Starlyn SkeansRoyal, Lashaunta Sicard U, RN 03/14/2016, 12:29 PM

## 2016-03-14 NOTE — Progress Notes (Signed)
Patient discharged to home accompanied by her husband.  Reviewed all her medications, follow-up appointments and discharge instructions, husband was present at bedside, understood and acknowledged.  IV and Tele discontinued before discharge, CCMD notified.  Patient took all her belongings with her.  She was escorted out in w/c by the staff.

## 2016-03-14 NOTE — Discharge Summary (Signed)
Physician Discharge Summary  Dorothy Long MRN: [id number] DOB/AGE: 1961-02-20 55 y.o.  PCP: [name], MD   Admit date: 03/11/2016 Discharge date: 03/14/2016  Discharge Diagnoses:     Principal Problem:   CHF, acute on chronic secondary to valvular heart disease Active Problems:   Hypertension   Hyperlipidemia   Chronic kidney disease with peritoneal dialysis as preferred modality, stage 5 (HCC)   Mitral stenosis with incompetence or regurgitation   Anemia in chronic kidney disease   GERD (gastroesophageal reflux disease)   PAF- CHADs VASc=6   Hyperparathyroidism due to renal insufficiency (HCC)   Chest pain   History of CVA (cerebrovascular accident)   Pleural effusion on right   Shortness of breath   Pleural effusion, right   Rheumatic mitral stenosis with regurgitation   Poor dentition   Protein-calorie malnutrition, severe   Congestive heart failure (CHF) ([place])    Follow-up recommendations Follow-up with PCP in 3-5 days , including all  additional recommended appointments as below Follow-up CBC, CMP in 3-5 days Dx Heart failure. Pt has a scheduled appt for 03/17/2016 @ 8am     Medication List    TAKE these medications        acetaminophen 325 MG tablet  Commonly known as:  TYLENOL  Take 650 mg by mouth every 4 (four) hours as needed for mild pain or headache.     albuterol 108 (90 Base) MCG/ACT inhaler  Commonly known as:  PROVENTIL HFA;VENTOLIN HFA  Inhale 2 puffs into the lungs every 6 (six) hours as needed for wheezing or shortness of breath.     aspirin 81 MG chewable tablet  Chew 81 mg by mouth daily.     atorvastatin 80 MG tablet  Commonly known as:  LIPITOR  Take 80 mg by mouth daily.     calcitRIOL 0.5 MCG capsule  Commonly known as:  ROCALTROL  Take 0.5 mcg by mouth daily.     calcium acetate 667 MG capsule  Commonly known as:  PHOSLO  Take 2,001 mg by mouth 3 (three) times daily with meals.     cinacalcet 30 MG tablet   Commonly known as:  SENSIPAR  Take 30 mg by mouth daily.     docusate sodium 100 MG capsule  Commonly known as:  COLACE  Take 100 mg by mouth daily as needed for mild constipation.     magnesium oxide 400 (241.3 Mg) MG tablet  Commonly known as:  MAG-OX  Take 1 tablet (400 mg total) by mouth daily.     ondansetron 4 MG disintegrating tablet  Commonly known as:  ZOFRAN-ODT  Take 4 mg by mouth every 8 (eight) hours as needed for nausea or vomiting.     oxyCODONE 5 MG immediate release tablet  Commonly known as:  ROXICODONE  Take 1 tablet (5 mg total) by mouth every 6 (six) hours as needed for severe pain.     pantoprazole 40 MG tablet  Commonly known as:  PROTONIX  Take 40 mg by mouth 2 (two) times daily.     sodium chloride 1 g tablet  Take 2 g by mouth 2 (two) times daily with a meal.     sucralfate 1 GM/10ML suspension  Commonly known as:  CARAFATE  Take 10 mLs (1 g total) by mouth every 6 (six) hours.     tiotropium 18 MCG inhalation capsule  Commonly known as:  SPIRIVA  Place 1 capsule (18 mcg total) into inhaler and inhale daily.  Discharge Condition: Prognosis poor  Discharge Instructions Get Medicines reviewed and adjusted: Please take all your medications with you for your next visit with your Primary MD  Please request your Primary MD to go over all hospital tests and procedure/radiological results at the follow up, please ask your Primary MD to get all Hospital records sent to his/her office.  If you experience worsening of your admission symptoms, develop shortness of breath, life threatening emergency, suicidal or homicidal thoughts you must seek medical attention immediately by calling 911 or calling your MD immediately if symptoms less severe.  You must read complete instructions/literature along with all the possible adverse reactions/side effects for all the Medicines you take and that have been prescribed to you. Take any new Medicines after  you have completely understood and accpet all the possible adverse reactions/side effects.   Do not drive when taking Pain medications.   Do not take more than prescribed Pain, Sleep and Anxiety Medications  Special Instructions: If you have smoked or chewed Tobacco in the last 2 yrs please stop smoking, stop any regular Alcohol and or any Recreational drug use.  Wear Seat belts while driving.  Please note  You were cared for by a hospitalist during your hospital stay. Once you are discharged, your primary care physician will handle any further medical issues. Please note that NO REFILLS for any discharge medications will be authorized once you are discharged, as it is imperative that you return to your primary care physician (or establish a relationship with a primary care physician if you do not have one) for your aftercare needs so that they can reassess your need for medications and monitor your lab values.      Discharge Instructions    Diet - low sodium heart healthy    Complete by:  As directed      Increase activity slowly    Complete by:  As directed            Allergies  Allergen Reactions  . Buprenorphine Nausea And Vomiting  . Ciprocinonide [Fluocinolone] Hives  . Demerol [Meperidine] Nausea And Vomiting  . Diflucan [Fluconazole] Nausea And Vomiting  . Flagyl [Metronidazole] Hives and Itching  . Hydrocodone Itching  . Levaquin [Levofloxacin] Other (See Comments)    Reaction: Numbness in legs   . Tetracyclines & Related Itching and Swelling  . Valium [Diazepam] Nausea And Vomiting and Other (See Comments)    Reaction:  Hallucinations       Disposition: 02-Transferred to [facility]: *Cardiothoracic surgery, cardiology, nephrology   Significant Diagnostic Studies:  Dg Chest 1 View  03/06/2016  CLINICAL DATA:  Post right thoracentesis EXAM: CHEST 1 VIEW COMPARISON:  03/04/2016 FINDINGS: Marked cardiomegaly with central vascular  congestion. Resolution of the right effusion following thoracentesis. No pneumothorax. Minor bibasilar atelectasis. Heavy calcification noted of the mitral valve. Trachea is midline. IMPRESSION: Negative for pneumothorax following thoracentesis. Cardiomegaly without CHF Extensive mitral valve calcifications. Electronically Signed   By: Jerilynn Mages.  Shick M.D.   On: 03/06/2016 11:13   Dg Chest 2 View  03/12/2016  CLINICAL DATA:  Shortness of breath, history of CHF, mitral valve disease, [place] OPD, dialysis dependent renal failure. EXAM: CHEST  2 VIEW COMPARISON:  Portable chest x-ray of March 10, 2016 FINDINGS: The left lung is mildly hyperinflated and clear. On the right there is mild volume loss due to a moderate-sized pleural effusion. There is right basilar atelectasis or pneumonia. The cardiac silhouette remains enlarged. The pulmonary  vascularity is mildly engorged. The mediastinum is normal in width. The bony thorax exhibits no acute abnormality. IMPRESSION: COPD. Moderate-sized right pleural effusion and right basilar atelectasis or pneumonia. CHF with mild pulmonary interstitial edema. Electronically Signed   By: David  Martinique M.D.   On: 03/12/2016 07:50   Dg Chest 2 View  03/04/2016  CLINICAL DATA:  Right lower leg pain and swelling for 5 days. Right-sided chest pain starting yesterday. EXAM: CHEST  2 VIEW COMPARISON:  12/18/2015 FINDINGS: Cardiac enlargement with mild central pulmonary vascular congestion suggesting arterial hypertension. Calcification projected over the heart corresponding calcification in the mitral valve annulus on prior CT. Mediastinal contours appear intact. There is increasing right pleural effusion with basilar atelectasis or consolidation in the right lung since the previous study. No pneumothorax. Vena caval filter and surgical clips demonstrated in the right upper quadrant. IMPRESSION: Cardiac enlargement with central pulmonary vascular congestion. Increasing right pleural  effusion with basilar infiltration or atelectasis. Electronically Signed   By: Lucienne Capers M.D.   On: 03/04/2016 19:40   Ct Chest Wo Contrast  03/05/2016  CLINICAL DATA:  End-stage renal disease with right pleural effusion and chronic congestive heart failure. EXAM: CT CHEST WITHOUT CONTRAST TECHNIQUE: Multidetector CT imaging of the chest was performed following the standard protocol without IV contrast. COMPARISON:  Chest CT February 08, 2016; chest radiograph March 04, 2016 FINDINGS: Mediastinum/Lymph Nodes: Thyroid appears unremarkable. There are subcentimeter mediastinal lymph nodes but no adenopathy by size criteria. There is no appreciable thoracic aortic aneurysm. Visualized great vessels appear unremarkable. There is generalized cardiomegaly with primarily right heart enlargement. There is a small pericardial effusion. There are multiple foci of coronary artery calcification. There is extensive mitral valve calcification. There is prominence of the central pulmonary arteries with rapid peripheral tapering. Lungs/Pleura: There remains a sizable right pleural effusion with atelectatic change in the right lower lobe. There is scarring in the lung bases. There is a rather minimal left pleural effusion with mild left base atelectasis. The somewhat nodular appearing opacity on axial images in the medial left base on prior study appears consistent with localized atelectasis at this time. Upper abdomen: In the visualized upper abdomen, kidneys are atrophic. Liver has a somewhat nodular contour, concerning for a degree of underlying cirrhosis. There is a filter in the inferior vena cava. There is calcification in the visualized abdominal aorta. Gallbladder is absent. Musculoskeletal: Bones are somewhat sclerotic consistent with chronic renal failure. No lytic bone lesions are evident. IMPRESSION: There is a persistent sizable right pleural effusion with right base atelectasis. There is a minimal left pleural  effusion with patchy scarring and atelectasis in the left base region. Small pericardial effusion. Changes indicative of a degree of cor pulmonale, stable. Extensive mitral valve and annulus calcification. There are multiple foci of coronary artery calcification. No adenopathy. Bony changes of chronic renal failure. Filter in the inferior vena cava. Kidneys atrophic. Liver has a somewhat nodular contour, concerning for a degree of underlying hepatic cirrhosis. Electronically Signed   By: Lowella Grip III M.D.   On: 03/05/2016 15:47   US Venous Img Lower Unilateral Left  03/06/2016  CLINICAL DATA:  Shortness of breath.  History of DVT. EXAM: LEFT LOWER EXTREMITY VENOUS DOPPLER ULTRASOUND TECHNIQUE: Gray-scale sonography with graded compression, as well as color Doppler and duplex ultrasound were performed to evaluate the lower extremity deep venous systems from the level of the common femoral vein and including the common femoral, femoral, profunda femoral, popliteal and calf veins  including the posterior tibial, peroneal and gastrocnemius veins when visible. The superficial great saphenous vein was also interrogated. Spectral Doppler was utilized to evaluate flow at rest and with distal augmentation maneuvers in the common femoral, femoral and popliteal veins. COMPARISON:  None. FINDINGS: Contralateral Common Femoral Vein: Respiratory phasicity is normal and symmetric with the symptomatic side. No evidence of thrombus. Normal compressibility. Common Femoral Vein: No evidence of thrombus. Normal compressibility, respiratory phasicity and response to augmentation. Saphenofemoral Junction: No evidence of thrombus. Normal compressibility and flow on color Doppler imaging. Profunda Femoral Vein: No evidence of thrombus. Normal compressibility and flow on color Doppler imaging. Femoral Vein: No evidence of thrombus. Normal compressibility, respiratory phasicity and response to augmentation. Popliteal Vein: No  evidence of thrombus. Normal compressibility, respiratory phasicity and response to augmentation. Calf Veins: No evidence of thrombus. Normal compressibility and flow on color Doppler imaging. Superficial Great Saphenous Vein: No evidence of thrombus. Normal compressibility and flow on color Doppler imaging. Other Findings:  None. IMPRESSION: No evidence of deep venous thrombosis. Electronically Signed   By: Marcello Moores  Register   On: 03/06/2016 11:20   US Venous Img Lower Unilateral Right  03/04/2016  CLINICAL DATA:  Pain and swelling in the right leg for 2 weeks. Previous history of deep venous thrombosis. Aspirin anticoagulation therapy. EXAM: Right LOWER EXTREMITY VENOUS DOPPLER ULTRASOUND TECHNIQUE: Gray-scale sonography with graded compression, as well as color Doppler and duplex ultrasound were performed to evaluate the lower extremity deep venous systems from the level of the common femoral vein and including the common femoral, femoral, profunda femoral, popliteal and calf veins including the posterior tibial, peroneal and gastrocnemius veins when visible. The superficial great saphenous vein was also interrogated. Spectral Doppler was utilized to evaluate flow at rest and with distal augmentation maneuvers in the common femoral, femoral and popliteal veins. COMPARISON:  None. FINDINGS: Contralateral Common Femoral Vein: Respiratory phasicity is normal and symmetric with the symptomatic side. No evidence of thrombus. Normal compressibility. Common Femoral Vein: No evidence of thrombus. Normal compressibility, respiratory phasicity and response to augmentation. Saphenofemoral Junction: No evidence of thrombus. Normal compressibility and flow on color Doppler imaging. Profunda Femoral Vein: No evidence of thrombus. Normal compressibility and flow on color Doppler imaging. Femoral Vein: No evidence of thrombus. Normal compressibility, respiratory phasicity and response to augmentation. Popliteal Vein: No  evidence of thrombus. Normal compressibility, respiratory phasicity and response to augmentation. Calf Veins: No evidence of thrombus. Normal compressibility and flow on color Doppler imaging. Superficial Great Saphenous Vein: No evidence of thrombus. Normal compressibility and flow on color Doppler imaging. Venous Reflux:  None. Other Findings:  None. IMPRESSION: No evidence of deep venous thrombosis. Electronically Signed   By: Lucienne Capers M.D.   On: 03/04/2016 19:31   Dg Chest Port 1 View  03/13/2016  CLINICAL DATA:  Postoperative check up of right pleural effusion EXAM: PORTABLE CHEST 1 VIEW COMPARISON:  PA and lateral chest x-ray of March 12, 2016 FINDINGS: There has been interval placement of a small caliber right chest tube. The tip of the tube projects over the medial costophrenic angle. No pleural fluid is visible today. There is no pneumothorax. The left lung is adequately inflated and exhibits no pleural effusion. The cardiac silhouette remains enlarged. The central pulmonary vascularity remains prominent. IMPRESSION: Interval resolution of right pleural effusion following small caliber chest tube placement. Electronically Signed   By: David  Martinique M.D.   On: 03/13/2016 15:29   Dg Chest Port 1 View  03/10/2016  CLINICAL DATA:  Chest pain, COPD, CHF EXAM: PORTABLE CHEST 1 VIEW COMPARISON:  03/06/2016 FINDINGS: There is a small right pleural effusion with basilar airspace disease. There is no other focal parenchymal opacity. There is no left pleural effusion. There is no pneumothorax. There is stable cardiomegaly. The osseous structures are unremarkable. IMPRESSION: 1. Small right pleural effusion with basilar airspace disease which may reflect atelectasis versus pneumonia. 2. Stable cardiomegaly. Electronically Signed   By: Kathreen Devoid   On: 03/10/2016 13:49   Dg C-arm 1-60 Min-no Report  03/13/2016  CLINICAL DATA: effusion C-ARM 1-60 MINUTES Fluoroscopy was utilized by the requesting  physician.  No radiographic interpretation.   US Thoracentesis Asp Pleural Space W/img Guide  03/06/2016  INDICATION: Shortness of breath, large right pleural effusion by CT EXAM: ULTRASOUND GUIDED RIGHT THORACENTESIS MEDICATIONS: 1% lidocaine locally COMPLICATIONS: None immediate. PROCEDURE: An ultrasound guided thoracentesis was thoroughly discussed with the patient and questions answered. The benefits, risks, alternatives and complications were also discussed. The patient understands and wishes to proceed with the procedure. Written consent was obtained. Ultrasound was performed to localize and mark an adequate pocket of fluid in the right chest. The area was then prepped and draped in the normal sterile fashion. 1% Lidocaine was used for local anesthesia. Under ultrasound guidance a 6 Fr Safe-T-Centesis catheter was introduced. Thoracentesis was performed. The catheter was removed and a dressing applied. FINDINGS: A total of approximately 1.3 L of clear pleural fluid was removed. Samples were sent to the laboratory as requested by the clinical team. IMPRESSION: Successful ultrasound guided right thoracentesis yielding 1.3 L of pleural fluid. Electronically Signed   By: Jerilynn Mages.  Shick M.D.   On: 03/06/2016 09:53    Cardiac catheterization Conclusion     Mid LAD lesion, 75% stenosed.  Mid RCA-1 lesion, 99% stenosed with left to right collaterals.  Overall normal left ventricular function. 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  Severe mitral stenosis. Using LV-PCWP tracing, mean gradient 15 mm Hg. MV area 1.1 cm 2  Moderate pulmonary hypertension. PA sat 59%. CO 5.3 L/min. CI 3.2.  IVC filter in place.       Filed Weights   03/12/16 0551 03/13/16 0353 03/13/16 2130  Weight: 52.2 kg (115 lb 1.3 oz) 52.4 kg (115 lb 8.3 oz) 50.1 kg (110 lb 7.2 oz)     Microbiology: Recent Results (from the past 240 hour(s))  Culture, body fluid-bottle     Status: None   Collection Time: 03/06/16   9:35 AM  Result Value Ref Range Status   Specimen Description PLEURAL  Final   Special Requests NONE  Final   Gram Stain NO ORGANISMS SEEN  Final   Culture NO GROWTH 4 DAYS  Final   Report Status 03/10/2016 FINAL  Final       Blood Culture    Component Value Date/Time   SDES PLEURAL 03/06/2016 0935   SPECREQUEST NONE 03/06/2016 0935   CULT NO GROWTH 4 DAYS 03/06/2016 0935   REPTSTATUS 03/10/2016 FINAL 03/06/2016 0935      Labs: Results for orders placed or performed during the hospital encounter of 03/11/16 (from the past 48 hour(s))  I-STAT 3, arterial blood gas (G3+)     Status: Abnormal   Collection Time: 03/12/16  2:42 PM  Result Value Ref Range   pH, Arterial 7.424 7.350 - 7.450   pCO2 arterial 33.8 (L) 35.0 - 45.0 mmHg   pO2, Arterial 80.0 80.0 - 100.0 mmHg   Bicarbonate 22.1 20.0 -  24.0 mEq/L   TCO2 23 0 - 100 mmol/L   O2 Saturation 96.0 %   Acid-base deficit 2.0 0.0 - 2.0 mmol/L   Patient temperature HIDE    Sample type ARTERIAL   I-STAT 3, venous blood gas (G3P V)     Status: Abnormal   Collection Time: 03/12/16  3:05 PM  Result Value Ref Range   pH, Ven 7.503 (H) 7.250 - 7.300   pCO2, Ven 28.8 (L) 45.0 - 50.0 mmHg   pO2, Ven 52.0 (H) 31.0 - 45.0 mmHg   Bicarbonate 22.6 20.0 - 24.0 mEq/L   TCO2 24 0 - 100 mmol/L   O2 Saturation 90.0 %   Patient temperature HIDE    Sample type VENOUS   I-STAT 3, venous blood gas (G3P V)     Status: Abnormal   Collection Time: 03/12/16  3:07 PM  Result Value Ref Range   pH, Ven 7.381 (H) 7.250 - 7.300   pCO2, Ven 39.9 (L) 45.0 - 50.0 mmHg   pO2, Ven 43.0 31.0 - 45.0 mmHg   Bicarbonate 23.6 20.0 - 24.0 mEq/L   TCO2 25 0 - 100 mmol/L   O2 Saturation 77.0 %   Acid-base deficit 1.0 0.0 - 2.0 mmol/L   Patient temperature HIDE    Sample type VENOUS   I-STAT 3, venous blood gas (G3P V)     Status: Abnormal   Collection Time: 03/12/16  3:10 PM  Result Value Ref Range   pH, Ven 7.361 (H) 7.250 - 7.300   pCO2, Ven 42.2 (L)  45.0 - 50.0 mmHg   pO2, Ven 32.0 31.0 - 45.0 mmHg   Bicarbonate 23.9 20.0 - 24.0 mEq/L   TCO2 25 0 - 100 mmol/L   O2 Saturation 59.0 %   Acid-base deficit 2.0 0.0 - 2.0 mmol/L   Patient temperature HIDE    Sample type VENOUS   Comprehensive metabolic panel     Status: Abnormal   Collection Time: 03/13/16  5:00 AM  Result Value Ref Range   Sodium 134 (L) 135 - 145 mmol/L   Potassium 3.6 3.5 - 5.1 mmol/L   Chloride 97 (L) 101 - 111 mmol/L   CO2 20 (L) 22 - 32 mmol/L   Glucose, Bld 133 (H) 65 - 99 mg/dL   BUN 44 (H) 6 - 20 mg/dL   Creatinine, Ser 8.87 (H) 0.44 - 1.00 mg/dL   Calcium 7.7 (L) 8.9 - 10.3 mg/dL   Total Protein 5.3 (L) 6.5 - 8.1 g/dL   Albumin 1.8 (L) 3.5 - 5.0 g/dL   AST 25 15 - 41 U/L   ALT 11 (L) 14 - 54 U/L   Alkaline Phosphatase 96 38 - 126 U/L   Total Bilirubin 0.7 0.3 - 1.2 mg/dL   GFR calc non Af Amer 5 (L) >60 mL/min   GFR calc Af Amer 5 (L) >60 mL/min    Comment: (NOTE) The eGFR has been calculated using the CKD EPI equation. This calculation has not been validated in all clinical situations. eGFR's persistently <60 mL/min signify possible Chronic Kidney Disease.    Anion gap 17 (H) 5 - 15  Magnesium     Status: None   Collection Time: 03/13/16  5:00 AM  Result Value Ref Range   Magnesium 1.7 1.7 - 2.4 mg/dL  Phosphorus     Status: None   Collection Time: 03/13/16  5:00 AM  Result Value Ref Range   Phosphorus 4.5 2.5 - 4.6 mg/dL  CBC  Status: Abnormal   Collection Time: 03/13/16  7:20 AM  Result Value Ref Range   WBC 12.4 (H) 4.0 - 10.5 K/uL   RBC 3.69 (L) 3.87 - 5.11 MIL/uL   Hemoglobin 9.6 (L) 12.0 - 15.0 g/dL   HCT 30.9 (L) 36.0 - 46.0 %   MCV 83.7 78.0 - 100.0 fL   MCH 26.0 26.0 - 34.0 pg   MCHC 31.1 30.0 - 36.0 g/dL   RDW 16.6 (H) 11.5 - 15.5 %   Platelets 176 150 - 400 K/uL  Glucose, capillary     Status: None   Collection Time: 03/13/16  3:01 PM  Result Value Ref Range   Glucose-Capillary 95 65 - 99 mg/dL  Magnesium     Status:  Abnormal   Collection Time: 03/14/16  5:47 AM  Result Value Ref Range   Magnesium 1.5 (L) 1.7 - 2.4 mg/dL     Lipid Panel     Component Value Date/Time   CHOL 137 12/11/2015 0452   CHOL 120 05/03/2014 1816   TRIG 71 12/11/2015 0452   TRIG 120 05/03/2014 1816   HDL 42 12/11/2015 0452   HDL 44 05/03/2014 1816   CHOLHDL 3.3 12/11/2015 0452   VLDL 14 12/11/2015 0452   VLDL 24 05/03/2014 1816   LDLCALC 81 12/11/2015 0452   LDLCALC 52 05/03/2014 1816   LDLDIRECT 164.6 07/21/2012 0911     Lab Results  Component Value Date   HGBA1C 5.5 03/12/2016     Lab Results  Component Value Date   MICROALBUR 63.1* 06/19/2014   [place] 81 12/11/2015   CREATININE 8.87* 03/13/2016     HPI :  56 year old African-American female from [facility] with 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, mitral regurgitation, chronic combined systolic and diastolic congestive heart failure, right pleural effusion, recent embolic stroke with right-sided hemiparesis, paroxysmal atrial fibrillation not on long-term anticoagulation, 2 recent hospitalizations for recurrent upper GI bleeding, 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   The patient denies any known history of rheumatic fever or scarlet fever during childhood. The patient first presented with paroxysmal atrial fibrillation and in October 2014 in the setting of an acute illness with diarrhea. Echocardiogram performed at that time revealed ejection fraction 99-83% with diastolic dysfunction, biatrial enlargement, mild aortic insufficiency, rheumatic mitral valve disease with mild to moderate mitral stenosis, moderate mitral  regurgitation and mild-to-moderate pulmonary hypertension. She was treated medically and not placed on long-term anticoagulation therapy at that time. Follow-up echocardiogram in May 2015 revealed ejection fraction 45-50%, small pericardial effusion, moderate mitral regurgitation, rheumatic appearing mitral valve. No transvalvular gradient was recorded across the mitral valve. In July 2015 she presented with an acute exacerbation of chronic congestive heart failure. Dialysis therapy was adjusted. In early December 2016 the patient presented with a near syncopal event. CT scan of the head and MRI did not reveal acute stroke. An echocardiogram was performed demonstrating ejection fraction 45%, high ventricular filling pressures, mild aortic insufficiency, moderate to severe mitral stenosis, severe left atrial enlargement. She was treated for presumed TIA of unclear etiology and it was recommended that she undergo TEE in the outpatient setting. A 30 day event monitor was recommended.  On 12/11/2015 the patient presented with an acute stroke manifest as severe dysarthria, aphasia, and  right hemiplegia. MRI and MRA of the brain revealed multifocal areas of acute infarction affecting the left hemisphere including the MCA, PCA and ACA territories. During her hospitalization she developed paroxysmal atrial fibrillation and ruled in for an acute non-ST segment elevation myocardial infarction. She was treated with aspirin and Plavix. She slowly recovered from her stroke and was eventually discharged to inpatient rehabilitation at [facility]. While there she suffered an acute GI bleed requiring transfusion 4 units packed red blood cells. She underwent upper and lower endoscopy during that hospitalization. Upper endoscopy revealed esophagitis, gastritis, and duodenitis but no active bleeding. Colonoscopy revealed diverticulosis in the sigmoid colon, "friability with contact bleeding in the entire colon", and a single  solitary ulcer in the rectum that was treated with a clip. During her hospitalization she was also found to have an acute DVT and an IVC filter was placed. She was returned to inpatient rehabilitation and eventually discharged home in early February on only low dose aspirin for anticoagulation.   The patient was hospitalized with another GI bleed and hemoglobin 4.5 at [facility] on 02/06/2016. She was transfused 2 units packed red blood cells. Aspirin was discontinued. Repeat endoscopy was not performed.  Most recently the patient was hospitalized on 03/04/2016 at [facility] with acute exacerbation of congestive heart failure with shortness of breath and right-sided chest pain. Troponin levels were minimally elevated and remained flat following hospital admission. CT angiogram of the chest was negative for PE but revealed a large right pleural effusion, findings suggestive of pulmonary hypertension with right-sided heart failure and cor pulmonale. In addition there appeared to be critical stenosis versus short segment focal occlusion of the origin of the celiac artery. There was severe peripheral arterial disease with high-grade stenosis versus short segment occlusion of the left common iliac artery and moderate to high-grade stenosis of the proximal right common iliac artery. The patient underwent right thoracentesis on 03/06/2016 yielding 1.3 L of transudative fluid.   Transthoracic echocardiogram performed 02/09/2016 revealed left ventricular ejection fraction estimated 55-60%. There was mild aortic insufficiency. There was moderate mitral regurgitation and severe mitral stenosis. Mean transvalvular gradient across the mitral valve was estimated 13 mmHg. There was moderate left atrial enlargement and moderate tricuspid regurgitation. Transesophageal echocardiogram was performed 03/07/2016. Ejection fraction was estimated at 45-50%. There were no regional  wall motion abnormalities appreciated. There was mild aortic insufficiency. There was severe mitral stenosis with mean transvalvular gradient estimated 10 mmHg. There was moderate mitral regurgitation. There was severe left atrial enlargement. There was no left atrial thrombus. There was moderate to severe right atrial enlargement and mild-to-moderate tricuspid regurgitation. The patient was transferred to [facility]. Diagnostic cardiac catheterization performed 03/12/2016 revealed multivessel coronary artery disease with 99% subtotal occlusion of the mid right coronary artery with left-to-right collaterals filling the distal portion of the vessel. There was a tubular stenosis of the mid left anterior descending coronary artery that was graded 75%. There was normal left ventricular systolic function. There was presence of a "basal inferior aneurysmal segment". There was severe mitral stenosis with mean transvalvular gradient estimated 15 mmHg based on PCW pressure and mitral valve area calculated 1.1 cm. There was moderate pulmonary hypertension with PA pressures measured 50/28. Cardiothoracic surgical consultation was requested.  The patient is married and lives with her husband in [place]. She has been completely nonambulatory since the stroke she suffered in December. She is wheelchair-bound. She requires assistance to get from the bed to  the wheelchair. Prior to her stroke she was fully ambulatory and functional.  She has a long history of symptoms of exertional shortness of breath dating back many years. According to the family she has been hospitalized several times for acute exacerbation of congestive heart failure over the last few years. Typically such acute exacerbations have been managed by adjustment to her dialysis therapy. She has been on chronic ambulatory peritoneal dialysis for approximately 2 years. Prior to that she was on hemodialysis for a short period of time. She  apparently did not do well with hemodialysis.   HOSPITAL COURSE:       Severe mitral stenosis/acute diastolic heart failure -Right and left heart catheterization showed multivessel coronary artery disease with 99% subtotal occlusion of the mid right coronary artery with left-to-right collaterals filling the distal portion of the vessel. There was a tubular stenosis of the mid left anterior descending coronary artery that was graded 75%. There was normal left ventricular systolic function. Severe mitral stenosis. Using LV-PCWP tracing, mean gradient 15 mm Hg. MV area 1.1 cm 2 --Evaluated by cardiothoracic surgery and felt not good candidate for surgery.     CAD --As above. Does not recommended PCI currently. Can consider PCI and stenting at later s/s of coronary ischemia. Poor prognosis.    End-stage renal disease peritoneal dialysis as preferred modality, stage 5    Pleural effusion -In part secondary to mitral stenosis causing elevated left atrial pressure. s/p Pleurx catheter placement  , refusing hospice /palliative care services   Paroxysmal atrial fibrillation --Maintaining sinus rhythm, rate stable. Has IVC filter.   GERD (gastroesophageal reflux disease)/ Esophagitis - BID Protonix   PAF- CHADs VASc=6 - s/p Maze procedure - only Aspirin 81 mg daily- recent GI bleeds - not on rate controlling agent   History of CVA (cerebrovascular accident) - wheelchair bound  COPD  - continue spiriva and albuterol PRN  Discharge Exam: *   Blood pressure 110/87, pulse 84, temperature 98 F (36.7 C), temperature source Oral, resp. rate 14, height 5' 10"  (1.778 m), weight 50.1 kg (110 lb 7.2 oz), SpO2 98 %.  General: Pleasant, NAD. Neuro: Alert and oriented X 3. Moves all extremities spontaneously. Psych: Normal affect. HEENT: Normal Neck: Supple without bruits or JVD. Lungs: Resp regular and unlabored, CTA. Heart: RRR no s3, s4, or murmurs. Abdomen: Soft,  non-tender, non-distended, BS + x 4.  Extremities: No clubbing, cyanosis or edema. DP/PT/Radials 2+ and equal bilaterally. R groin site stable    Follow-up Information    Follow up with [name], MD. Schedule an appointment as soon as possible for a visit in 3 days.   Specialty:  Internal Medicine   Why:  Appointment Date: 03/17/2016 @8 :00a.    Contact information:   [place] [place] [address] [phone]       Signed: Reyne Dumas 03/14/2016, 11:20 AM        Time spent >45 mins

## 2016-03-14 NOTE — Anesthesia Postprocedure Evaluation (Signed)
Anesthesia Post Note  Patient: Dorothy Long  Procedure(s) Performed: Procedure(s) (LRB): INSERTION PLEURAL DRAINAGE CATHETER (Right)  Patient location during evaluation: PACU Anesthesia Type: MAC Level of consciousness: awake Pain management: pain level controlled Vital Signs Assessment: post-procedure vital signs reviewed and stable Respiratory status: spontaneous breathing and patient connected to nasal cannula oxygen Cardiovascular status: stable Postop Assessment: no signs of nausea or vomiting Anesthetic complications: no    Last Vitals:  Filed Vitals:   03/14/16 0521 03/14/16 0840  BP: 85/52 110/87  Pulse: 87 84  Temp: 36.8 C 36.7 C  Resp: 16 14    Last Pain:  Filed Vitals:   03/14/16 0903  PainSc: 0-No pain                 Casilda Pickerill

## 2016-03-14 NOTE — Progress Notes (Signed)
Patient had 7 beats of V Tach, patient is asymptomatic, no complaints of chest pain. Blood pressure equals 99/74, Heart rate equals 110. NP on call notified, magnesium lab draw ordered. Will continue to monitor.

## 2016-03-17 ENCOUNTER — Encounter: Payer: Self-pay | Admitting: Internal Medicine

## 2016-03-17 ENCOUNTER — Telehealth: Payer: Self-pay | Admitting: *Deleted

## 2016-03-17 ENCOUNTER — Ambulatory Visit (INDEPENDENT_AMBULATORY_CARE_PROVIDER_SITE_OTHER): Payer: BC Managed Care – PPO | Admitting: Internal Medicine

## 2016-03-17 VITALS — BP 104/70 | HR 71 | Temp 97.6°F | Resp 12 | Ht 70.0 in | Wt 111.0 lb

## 2016-03-17 DIAGNOSIS — N179 Acute kidney failure, unspecified: Secondary | ICD-10-CM | POA: Diagnosis not present

## 2016-03-17 DIAGNOSIS — N185 Chronic kidney disease, stage 5: Secondary | ICD-10-CM

## 2016-03-17 DIAGNOSIS — J90 Pleural effusion, not elsewhere classified: Secondary | ICD-10-CM

## 2016-03-17 DIAGNOSIS — Z09 Encounter for follow-up examination after completed treatment for conditions other than malignant neoplasm: Secondary | ICD-10-CM

## 2016-03-17 DIAGNOSIS — I420 Dilated cardiomyopathy: Secondary | ICD-10-CM | POA: Diagnosis not present

## 2016-03-17 DIAGNOSIS — D631 Anemia in chronic kidney disease: Secondary | ICD-10-CM

## 2016-03-17 DIAGNOSIS — Z992 Dependence on renal dialysis: Secondary | ICD-10-CM

## 2016-03-17 DIAGNOSIS — D638 Anemia in other chronic diseases classified elsewhere: Secondary | ICD-10-CM

## 2016-03-17 DIAGNOSIS — N189 Chronic kidney disease, unspecified: Secondary | ICD-10-CM

## 2016-03-17 DIAGNOSIS — I050 Rheumatic mitral stenosis: Secondary | ICD-10-CM

## 2016-03-17 DIAGNOSIS — J948 Other specified pleural conditions: Secondary | ICD-10-CM

## 2016-03-17 DIAGNOSIS — I480 Paroxysmal atrial fibrillation: Secondary | ICD-10-CM

## 2016-03-17 LAB — COMPREHENSIVE METABOLIC PANEL
ALBUMIN: 2.2 g/dL — AB (ref 3.5–5.2)
ALK PHOS: 108 U/L (ref 39–117)
ALT: 10 U/L (ref 0–35)
AST: 22 U/L (ref 0–37)
BILIRUBIN TOTAL: 0.4 mg/dL (ref 0.2–1.2)
BUN: 50 mg/dL — AB (ref 6–23)
CO2: 25 mEq/L (ref 19–32)
Calcium: 8 mg/dL — ABNORMAL LOW (ref 8.4–10.5)
Chloride: 94 mEq/L — ABNORMAL LOW (ref 96–112)
Creatinine, Ser: 8.63 mg/dL (ref 0.40–1.20)
GFR: 6.2 mL/min — AB (ref >60.00)
GLUCOSE: 103 mg/dL — AB (ref 70–99)
POTASSIUM: 3.4 meq/L — AB (ref 3.5–5.1)
SODIUM: 132 meq/L — AB (ref 135–145)
TOTAL PROTEIN: 5.2 g/dL — AB (ref 6.0–8.3)

## 2016-03-17 LAB — CBC WITH DIFFERENTIAL/PLATELET
BASOS ABS: 0 10*3/uL (ref 0.0–0.1)
Basophils Relative: 0.2 % (ref 0.0–3.0)
EOS PCT: 6.8 % — AB (ref 0.0–5.0)
Eosinophils Absolute: 0.6 10*3/uL (ref 0.0–0.7)
HCT: 28.9 % — ABNORMAL LOW (ref 36.0–46.0)
HEMOGLOBIN: 9.3 g/dL — AB (ref 12.0–15.0)
LYMPHS ABS: 0.7 10*3/uL (ref 0.7–4.0)
Lymphocytes Relative: 7.5 % — ABNORMAL LOW (ref 12.0–46.0)
MCHC: 32.2 g/dL (ref 30.0–36.0)
MCV: 84.3 fl (ref 78.0–100.0)
MONO ABS: 0.9 10*3/uL (ref 0.1–1.0)
Monocytes Relative: 10.3 % (ref 3.0–12.0)
NEUTROS PCT: 75.2 % (ref 43.0–77.0)
Neutro Abs: 6.5 10*3/uL (ref 1.4–7.7)
Platelets: 176 10*3/uL (ref 150.0–400.0)
RBC: 3.43 Mil/uL — AB (ref 3.87–5.11)
RDW: 17.8 % — ABNORMAL HIGH (ref 11.5–15.5)
WBC: 8.7 10*3/uL (ref 4.0–10.5)

## 2016-03-17 MED ORDER — HYDROCODONE-ACETAMINOPHEN 5-325 MG PO TABS: 1.0000 | ORAL_TABLET | Freq: Four times a day (QID) | ORAL | Status: DC | PRN

## 2016-03-17 NOTE — Patient Instructions (Addendum)
If you use an oxycodone,  Make sure you take a dulcolax or sennokot    Premier Protein shakes  Have 30 g protein 160 cal,  Low sugar great for when you are not hungry   Try using the vicodin during the day for pain control so you aren't too sleepy to eat  .  iT IS LESS STONG THAN OXYCODONE   You do not need to take vicodin and tylenol together bc the vicodin  contains 325 mg tylenol in each tablet   maximum daily dose of tylenol is 2000 mg

## 2016-03-17 NOTE — Telephone Encounter (Signed)
Critical  Creatine 8.63   GFR: 6.19

## 2016-03-17 NOTE — Progress Notes (Signed)
Pre-visit discussion using our clinic review tool. No additional management support is needed unless otherwise documented below in the visit note.  

## 2016-03-17 NOTE — Progress Notes (Signed)
Subjective:  Patient ID: Dorothy Long, female    DOB: 06/16/1961  Age: 55 y.o. MRN: [id number]  CC: The primary encounter diagnosis was Anemia of chronic disease. Diagnoses of Acute renal failure, unspecified acute renal failure type (HCC), Mitral valve stenosis, Anemia in chronic kidney disease, PAF- CHADs VASc=6, Congestive dilated cardiomyopathy (HCC), Chronic kidney disease with peritoneal dialysis as preferred modality, stage 5 A [facility]), Hospital discharge follow-up, and Pleural effusion, right were also pertinent to this visit.  HPI Dorothy Long presents for hospital follow up.  Patient was admitted to [facility] on march 22 with hypoxic respiratory failure secondary to a large right sided pleural effusion.  She underwent thoracentesis of 1.3 of transudative fluid on March  and was transferred to [facility] on  Tuesday  March 28th, for chest tube placement , diagnostic cardia cath and cardiothoracic consultation regarding severe mitral stenosis.  She was discharged home with chest tube and home health on Friday march 31 .     Cardiac cath and cardiothoracic surgery evaluation were done. Significant multivessel CAD in 2 arteries was noted, and she was  considered a poor surgical candidate for immediate intervention due to poor prognosis with ongoing weight loss, recent CVA,  Recurrent GI bleeds and ESKD.  She was offered Palliative /Hospcie services (reportedly) by refused.    Very tired  Today but responsive verbally and appropriate. .  Accompanied by husbnad.    Using oxycodone every other day for right sided rib and chest pain .  Only taking at bedtime because it is sedating .  Last dose was  Saturday night  Pain is 7/10 currently.   Outpatient Prescriptions Prior to Visit  Medication Sig Dispense Refill  . acetaminophen (TYLENOL) 325 MG tablet Take 650 mg by mouth every 4 (four) hours as needed for mild pain or headache.     . albuterol (PROVENTIL HFA;VENTOLIN HFA) 108 (90 Base) MCG/ACT  inhaler Inhale 2 puffs into the lungs every 6 (six) hours as needed for wheezing or shortness of breath.    Marland Kitchen aspirin 81 MG chewable tablet Chew 81 mg by mouth daily.    Marland Kitchen atorvastatin (LIPITOR) 80 MG tablet Take 80 mg by mouth daily.     . calcitRIOL (ROCALTROL) 0.5 MCG capsule Take 0.5 mcg by mouth daily.     . calcium acetate (PHOSLO) 667 MG capsule Take 2,001 mg by mouth 3 (three) times daily with meals.    . cinacalcet (SENSIPAR) 30 MG tablet Take 30 mg by mouth daily.    Marland Kitchen docusate sodium (COLACE) 100 MG capsule Take 100 mg by mouth daily as needed for mild constipation.     . magnesium oxide (MAG-OX) 400 (241.3 Mg) MG tablet Take 1 tablet (400 mg total) by mouth daily. 30 tablet 0  . ondansetron (ZOFRAN-ODT) 4 MG disintegrating tablet Take 4 mg by mouth every 8 (eight) hours as needed for nausea or vomiting.     Marland Kitchen oxyCODONE (ROXICODONE) 5 MG immediate release tablet Take 1 tablet (5 mg total) by mouth every 6 (six) hours as needed for severe pain. 30 tablet 0  . pantoprazole (PROTONIX) 40 MG tablet Take 40 mg by mouth 2 (two) times daily.     . sodium chloride 1 g tablet Take 2 g by mouth 2 (two) times daily with a meal.    . sucralfate (CARAFATE) 1 GM/10ML suspension Take 10 mLs (1 g total) by mouth every 6 (six) hours. 420 mL 0  . tiotropium (SPIRIVA) 18 MCG  inhalation capsule Place 1 capsule (18 mcg total) into inhaler and inhale daily. 30 capsule 0   No facility-administered medications prior to visit.    Review of Systems;  Patient denies headache, fevers, malaise, unintentional weight loss, skin rash, eye pain, sinus congestion and sinus pain, sore throat, dysphagia,  hemoptysis , cough, dyspnea, wheezing, chest pain, palpitations, orthopnea, edema, abdominal pain, nausea, melena, diarrhea, constipation, flank pain, dysuria, hematuria, urinary  Frequency, nocturia, numbness, tingling, seizures,  Focal weakness, Loss of consciousness,  Tremor, insomnia, depression, anxiety, and  suicidal ideation.      Objective:  BP 104/70 mmHg  Pulse 71  Temp(Src) 97.6 F (36.4 C) (Oral)  Resp 12  Ht 5\' 10"  (1.778 m)  Wt 111 lb (50.349 kg)  BMI 15.93 kg/m2  SpO2 99%  BP Readings from Last 3 Encounters:  03/17/16 104/70  03/14/16 110/87  03/11/16 117/80    Wt Readings from Last 3 Encounters:  03/17/16 111 lb (50.349 kg)  03/13/16 110 lb 7.2 oz (50.1 kg)  03/11/16 108 lb 8 oz (49.215 kg)    General appearance: alert, cooperative and appears stated age Ears: normal TM's and external ear canals both ears Throat: lips, mucosa, and tongue normal; teeth and gums normal Neck: no adenopathy, no carotid bruit, supple, symmetrical, trachea midline and thyroid not enlarged, symmetric, no tenderness/mass/nodules Back: symmetric, no curvature. ROM normal. No CVA tenderness. Lungs: clear to auscultation bilaterally Heart: regular rate and rhythm, S1, S2 normal, no murmur, click, rub or gallop Abdomen: soft, non-tender; bowel sounds normal; no masses,  no organomegaly Pulses: 2+ and symmetric Skin: Skin color, texture, turgor normal. No rashes or lesions Lymph nodes: Cervical, supraclavicular, and axillary nodes normal.  Lab Results  Component Value Date   HGBA1C 5.5 03/12/2016    Lab Results  Component Value Date   CREATININE 8.63* 03/17/2016   CREATININE 8.87* 03/13/2016   CREATININE 9.81* 03/12/2016    Lab Results  Component Value Date   WBC 8.7 03/17/2016   HGB 9.3* 03/17/2016   HCT 28.9* 03/17/2016   PLT 176.0 03/17/2016   GLUCOSE 103* 03/17/2016   CHOL 137 12/11/2015   TRIG 71 12/11/2015   HDL 42 12/11/2015   LDLDIRECT 164.6 07/21/2012   LDLCALC 81 12/11/2015   ALT 10 03/17/2016   AST 22 03/17/2016   NA 132* 03/17/2016   K 3.4* 03/17/2016   CL 94* 03/17/2016   CREATININE 8.63* 03/17/2016   BUN 50* 03/17/2016   CO2 25 03/17/2016   TSH 1.24 01/19/2014   INR 1.10 03/12/2016   HGBA1C 5.5 03/12/2016   MICROALBUR 63.1* 06/19/2014    Dg Chest 2  View  03/12/2016  CLINICAL DATA:  Shortness of breath, history of CHF, mitral valve disease, Celsius OPD, dialysis dependent renal failure. EXAM: CHEST  2 VIEW COMPARISON:  Portable chest x-ray of March 10, 2016 FINDINGS: The left lung is mildly hyperinflated and clear. On the right there is mild volume loss due to a moderate-sized pleural effusion. There is right basilar atelectasis or pneumonia. The cardiac silhouette remains enlarged. The pulmonary vascularity is mildly engorged. The mediastinum is normal in width. The bony thorax exhibits no acute abnormality. IMPRESSION: COPD. Moderate-sized right pleural effusion and right basilar atelectasis or pneumonia. CHF with mild pulmonary interstitial edema. Electronically Signed   By: David  SwazilandJordan M.D.   On: 03/12/2016 07:50    Assessment & Plan:   Problem List Items Addressed This Visit    Chronic kidney disease with peritoneal dialysis as preferred  modality, stage 5 (HCC) (Chronic)    Managed by [facility] with peritoneal dialysis. Repeat BMEt done today.  Lab Results  Component Value Date   CREATININE 8.63* 03/17/2016   Lab Results  Component Value Date   NA 132* 03/17/2016   K 3.4* 03/17/2016   CL 94* 03/17/2016   CO2 25 03/17/2016         Anemia in chronic kidney disease (Chronic)    Complicated by recurrent Gi bleeds,  hgb is Stable since discharge. Continue IV iron  CBC Latest Ref Rng 03/17/2016 03/13/2016 03/12/2016  WBC 4.0 - 10.5 K/uL 8.7 12.4(H) 5.8  Hemoglobin 12.0 - 15.0 g/dL 1.6(X) 0.9(U) 8.1(L)  Hematocrit 36.0 - 46.0 % 28.9(L) 30.9(L) 25.8(L)  Platelets 150.0 - 400.0 K/uL 176.0 176 182         PAF- CHADs VASc=6 (Chronic)    With prior embolic stroke,  Poor anticoagulation candidate due to recurrent GI bleeds.       Mitral valve stenosis (Chronic)    Severe now with right sided  large pleural effusion. She is not a candidate for surgery currently.  Chest tube was placed and Home health is coming out every other  day to drain.       Congestive dilated cardiomyopathy (HCC) (Chronic)    Secondary to valvular disease and signifcant CAD .  Not a good surgical candidate .  Prognosis is poor per cardiology ,  But patient has declined palliative/hospice care.       Pleural effusion, right    Transudative, secondary to valvular cardiomyopathy and protein malnutrition. Home Health is coming out to drain catheter every other day   Follow up with pulmonology       Hospital discharge follow-up    Patient is stable post discharge and has no new issues or questions about her discharge plans.  Diet discussed with increased protein and caloric intake encouraged.        Other Visit Diagnoses    Anemia of chronic disease    -  Primary    Relevant Orders    CBC with Differential/Platelet (Completed)    Acute renal failure, unspecified acute renal failure type (HCC)        Relevant Orders    Comprehensive metabolic panel (Completed)       I am having Ms. Brase start on HYDROcodone-acetaminophen. I am also having her maintain her acetaminophen, tiotropium, atorvastatin, calcitRIOL, pantoprazole, aspirin, cinacalcet, docusate sodium, ondansetron, sodium chloride, sucralfate, albuterol, calcium acetate, magnesium oxide, and oxyCODONE.  Meds ordered this encounter  Medications  . HYDROcodone-acetaminophen (NORCO/VICODIN) 5-325 MG tablet    Sig: Take 1 tablet by mouth every 6 (six) hours as needed for moderate pain.    Dispense:  60 tablet    Refill:  0    There are no discontinued medications.  Follow-up: No Follow-up on file.   Sherlene Shams, MD

## 2016-03-18 ENCOUNTER — Encounter: Payer: Self-pay | Admitting: *Deleted

## 2016-03-18 DIAGNOSIS — Z09 Encounter for follow-up examination after completed treatment for conditions other than malignant neoplasm: Secondary | ICD-10-CM | POA: Insufficient documentation

## 2016-03-18 NOTE — Assessment & Plan Note (Signed)
Transudative, secondary to valvular cardiomyopathy and protein malnutrition. Home Health is coming out to drain catheter every other day   Follow up with pulmonology

## 2016-03-18 NOTE — Addendum Note (Signed)
Addended by: Meyer CoryAHMAD, Jerron Niblack R on: 03/18/2016 11:00 AM   Modules accepted: Orders

## 2016-03-18 NOTE — Assessment & Plan Note (Signed)
[facility] with [facility]. Repeat BMEt done today.  Lab Results  Component Value Date   CREATININE 8.63* 03/17/2016   Lab Results  Component Value Date   NA 132* 03/17/2016   K 3.4* 03/17/2016   CL 94* 03/17/2016   CO2 25 03/17/2016

## 2016-03-18 NOTE — Assessment & Plan Note (Signed)
With prior embolic stroke,  Poor anticoagulation candidate due to recurrent GI bleeds.

## 2016-03-18 NOTE — Assessment & Plan Note (Signed)
Patient is stable post discharge and has no new issues or questions about her discharge plans.  Diet discussed with increased protein and caloric intake encouraged.

## 2016-03-18 NOTE — Assessment & Plan Note (Addendum)
Complicated by recurrent Gi bleeds,  hgb is Stable since discharge. Continue IV iron  CBC Latest Ref Rng 03/17/2016 03/13/2016 03/12/2016  WBC 4.0 - 10.5 K/uL 8.7 12.4(H) 5.8  Hemoglobin 12.0 - 15.0 g/dL 1.6(X9.3(L) 0.9(U9.6(L) 8.1(L)  Hematocrit 36.0 - 46.0 % 28.9(L) 30.9(L) 25.8(L)  Platelets 150.0 - 400.0 K/uL 176.0 176 182

## 2016-03-18 NOTE — Assessment & Plan Note (Signed)
Secondary to valvular disease and signifcant CAD .  Not a good surgical candidate .  Prognosis is poor per cardiology ,  But patient has declined palliative/hospice care.

## 2016-03-18 NOTE — Telephone Encounter (Signed)
appt scheduled and appt card mailed to pt. Needs CXR prior to appt. Nothing further needed.

## 2016-03-18 NOTE — Assessment & Plan Note (Signed)
Severe now with right sided  large pleural effusion. She is not a candidate for surgery currently.  Chest tube was placed and Home health is coming out every other day to drain.

## 2016-03-24 ENCOUNTER — Ambulatory Visit: Payer: BC Managed Care – PPO | Admitting: Internal Medicine

## 2016-03-24 ENCOUNTER — Telehealth: Payer: Self-pay | Admitting: *Deleted

## 2016-03-24 NOTE — Telephone Encounter (Signed)
[facility] has requested clarity on a diagnosis for diabetes.  Contact [facility] -Noreene LarssonJill [phone]

## 2016-03-24 NOTE — Telephone Encounter (Signed)
Spoke with jill at [facility], no diagnosis that I can find in the chart.  Clarified with her. thanks

## 2016-04-04 ENCOUNTER — Telehealth: Payer: Self-pay | Admitting: Internal Medicine

## 2016-04-04 NOTE — Telephone Encounter (Signed)
I gave [facility] the verbal for 2 more visits to make up the 2 missed ? OK

## 2016-04-04 NOTE — Telephone Encounter (Signed)
Tank you.

## 2016-04-04 NOTE — Telephone Encounter (Signed)
Amy [phone] called from [facility] to inform doctor visit was missed pt husband called to cancel and to request verbal order for two more visits for the week of May 1st. Thank you!

## 2016-04-04 NOTE — Telephone Encounter (Signed)
Please advise for two more Visits from [facility]. thanks

## 2016-04-07 ENCOUNTER — Observation Stay
Admission: EM | Admit: 2016-04-07 | Discharge: 2016-04-11 | Disposition: A | Discharge status: Home / Self Care | Payer: BC Managed Care – PPO | Attending: Specialist | Admitting: Specialist

## 2016-04-07 ENCOUNTER — Emergency Department: Payer: BC Managed Care – PPO

## 2016-04-07 ENCOUNTER — Encounter: Payer: Self-pay | Admitting: Emergency Medicine

## 2016-04-07 DIAGNOSIS — Z9049 Acquired absence of other specified parts of digestive tract: Secondary | ICD-10-CM | POA: Insufficient documentation

## 2016-04-07 DIAGNOSIS — D631 Anemia in chronic kidney disease: Secondary | ICD-10-CM | POA: Diagnosis not present

## 2016-04-07 DIAGNOSIS — N2581 Secondary hyperparathyroidism of renal origin: Secondary | ICD-10-CM | POA: Diagnosis not present

## 2016-04-07 DIAGNOSIS — N186 End stage renal disease: Secondary | ICD-10-CM | POA: Insufficient documentation

## 2016-04-07 DIAGNOSIS — E8770 Fluid overload, unspecified: Secondary | ICD-10-CM | POA: Insufficient documentation

## 2016-04-07 DIAGNOSIS — K2970 Gastritis, unspecified, without bleeding: Secondary | ICD-10-CM | POA: Insufficient documentation

## 2016-04-07 DIAGNOSIS — K219 Gastro-esophageal reflux disease without esophagitis: Secondary | ICD-10-CM | POA: Diagnosis not present

## 2016-04-07 DIAGNOSIS — K760 Fatty (change of) liver, not elsewhere classified: Secondary | ICD-10-CM | POA: Insufficient documentation

## 2016-04-07 DIAGNOSIS — J449 Chronic obstructive pulmonary disease, unspecified: Secondary | ICD-10-CM | POA: Insufficient documentation

## 2016-04-07 DIAGNOSIS — I480 Paroxysmal atrial fibrillation: Secondary | ICD-10-CM | POA: Insufficient documentation

## 2016-04-07 DIAGNOSIS — E559 Vitamin D deficiency, unspecified: Secondary | ICD-10-CM | POA: Insufficient documentation

## 2016-04-07 DIAGNOSIS — R0789 Other chest pain: Secondary | ICD-10-CM | POA: Insufficient documentation

## 2016-04-07 DIAGNOSIS — Z888 Allergy status to other drugs, medicaments and biological substances status: Secondary | ICD-10-CM | POA: Insufficient documentation

## 2016-04-07 DIAGNOSIS — I2510 Atherosclerotic heart disease of native coronary artery without angina pectoris: Secondary | ICD-10-CM | POA: Insufficient documentation

## 2016-04-07 DIAGNOSIS — Z7951 Long term (current) use of inhaled steroids: Secondary | ICD-10-CM | POA: Insufficient documentation

## 2016-04-07 DIAGNOSIS — E785 Hyperlipidemia, unspecified: Secondary | ICD-10-CM | POA: Insufficient documentation

## 2016-04-07 DIAGNOSIS — E1122 Type 2 diabetes mellitus with diabetic chronic kidney disease: Secondary | ICD-10-CM | POA: Insufficient documentation

## 2016-04-07 DIAGNOSIS — Z881 Allergy status to other antibiotic agents status: Secondary | ICD-10-CM | POA: Insufficient documentation

## 2016-04-07 DIAGNOSIS — K761 Chronic passive congestion of liver: Secondary | ICD-10-CM | POA: Insufficient documentation

## 2016-04-07 DIAGNOSIS — E43 Unspecified severe protein-calorie malnutrition: Secondary | ICD-10-CM | POA: Diagnosis not present

## 2016-04-07 DIAGNOSIS — R109 Unspecified abdominal pain: Principal | ICD-10-CM | POA: Insufficient documentation

## 2016-04-07 DIAGNOSIS — I132 Hypertensive heart and chronic kidney disease with heart failure and with stage 5 chronic kidney disease, or end stage renal disease: Secondary | ICD-10-CM | POA: Diagnosis not present

## 2016-04-07 DIAGNOSIS — K7460 Unspecified cirrhosis of liver: Secondary | ICD-10-CM | POA: Insufficient documentation

## 2016-04-07 DIAGNOSIS — R918 Other nonspecific abnormal finding of lung field: Secondary | ICD-10-CM | POA: Insufficient documentation

## 2016-04-07 DIAGNOSIS — Z885 Allergy status to narcotic agent status: Secondary | ICD-10-CM | POA: Insufficient documentation

## 2016-04-07 DIAGNOSIS — I420 Dilated cardiomyopathy: Secondary | ICD-10-CM | POA: Insufficient documentation

## 2016-04-07 DIAGNOSIS — K921 Melena: Secondary | ICD-10-CM | POA: Insufficient documentation

## 2016-04-07 DIAGNOSIS — I252 Old myocardial infarction: Secondary | ICD-10-CM | POA: Insufficient documentation

## 2016-04-07 DIAGNOSIS — R079 Chest pain, unspecified: Secondary | ICD-10-CM

## 2016-04-07 DIAGNOSIS — I5042 Chronic combined systolic (congestive) and diastolic (congestive) heart failure: Secondary | ICD-10-CM | POA: Insufficient documentation

## 2016-04-07 DIAGNOSIS — I050 Rheumatic mitral stenosis: Secondary | ICD-10-CM | POA: Insufficient documentation

## 2016-04-07 DIAGNOSIS — R188 Other ascites: Secondary | ICD-10-CM | POA: Insufficient documentation

## 2016-04-07 DIAGNOSIS — R471 Dysarthria and anarthria: Secondary | ICD-10-CM | POA: Diagnosis not present

## 2016-04-07 DIAGNOSIS — I6529 Occlusion and stenosis of unspecified carotid artery: Secondary | ICD-10-CM | POA: Insufficient documentation

## 2016-04-07 DIAGNOSIS — Z79899 Other long term (current) drug therapy: Secondary | ICD-10-CM | POA: Insufficient documentation

## 2016-04-07 DIAGNOSIS — K5730 Diverticulosis of large intestine without perforation or abscess without bleeding: Secondary | ICD-10-CM | POA: Insufficient documentation

## 2016-04-07 DIAGNOSIS — Z992 Dependence on renal dialysis: Secondary | ICD-10-CM | POA: Insufficient documentation

## 2016-04-07 DIAGNOSIS — I272 Other secondary pulmonary hypertension: Secondary | ICD-10-CM | POA: Insufficient documentation

## 2016-04-07 DIAGNOSIS — J309 Allergic rhinitis, unspecified: Secondary | ICD-10-CM | POA: Insufficient documentation

## 2016-04-07 DIAGNOSIS — R4701 Aphasia: Secondary | ICD-10-CM | POA: Insufficient documentation

## 2016-04-07 DIAGNOSIS — N952 Postmenopausal atrophic vaginitis: Secondary | ICD-10-CM | POA: Insufficient documentation

## 2016-04-07 DIAGNOSIS — R05 Cough: Secondary | ICD-10-CM | POA: Insufficient documentation

## 2016-04-07 DIAGNOSIS — I739 Peripheral vascular disease, unspecified: Secondary | ICD-10-CM | POA: Insufficient documentation

## 2016-04-07 DIAGNOSIS — R4182 Altered mental status, unspecified: Secondary | ICD-10-CM | POA: Diagnosis not present

## 2016-04-07 DIAGNOSIS — D696 Thrombocytopenia, unspecified: Secondary | ICD-10-CM | POA: Insufficient documentation

## 2016-04-07 DIAGNOSIS — I517 Cardiomegaly: Secondary | ICD-10-CM | POA: Insufficient documentation

## 2016-04-07 DIAGNOSIS — I708 Atherosclerosis of other arteries: Secondary | ICD-10-CM | POA: Insufficient documentation

## 2016-04-07 DIAGNOSIS — J90 Pleural effusion, not elsewhere classified: Secondary | ICD-10-CM | POA: Diagnosis not present

## 2016-04-07 DIAGNOSIS — N250 Renal osteodystrophy: Secondary | ICD-10-CM | POA: Insufficient documentation

## 2016-04-07 DIAGNOSIS — F329 Major depressive disorder, single episode, unspecified: Secondary | ICD-10-CM | POA: Insufficient documentation

## 2016-04-07 DIAGNOSIS — Z87891 Personal history of nicotine dependence: Secondary | ICD-10-CM | POA: Insufficient documentation

## 2016-04-07 DIAGNOSIS — Z800 Family history of malignant neoplasm of digestive organs: Secondary | ICD-10-CM | POA: Insufficient documentation

## 2016-04-07 DIAGNOSIS — R52 Pain, unspecified: Secondary | ICD-10-CM | POA: Diagnosis present

## 2016-04-07 DIAGNOSIS — Z8249 Family history of ischemic heart disease and other diseases of the circulatory system: Secondary | ICD-10-CM | POA: Insufficient documentation

## 2016-04-07 DIAGNOSIS — I69351 Hemiplegia and hemiparesis following cerebral infarction affecting right dominant side: Secondary | ICD-10-CM | POA: Insufficient documentation

## 2016-04-07 DIAGNOSIS — Z7982 Long term (current) use of aspirin: Secondary | ICD-10-CM | POA: Insufficient documentation

## 2016-04-07 DIAGNOSIS — Z90710 Acquired absence of both cervix and uterus: Secondary | ICD-10-CM | POA: Insufficient documentation

## 2016-04-07 DIAGNOSIS — N269 Renal sclerosis, unspecified: Secondary | ICD-10-CM | POA: Diagnosis not present

## 2016-04-07 DIAGNOSIS — Z7902 Long term (current) use of antithrombotics/antiplatelets: Secondary | ICD-10-CM | POA: Insufficient documentation

## 2016-04-07 DIAGNOSIS — R55 Syncope and collapse: Secondary | ICD-10-CM | POA: Insufficient documentation

## 2016-04-07 DIAGNOSIS — R112 Nausea with vomiting, unspecified: Secondary | ICD-10-CM | POA: Insufficient documentation

## 2016-04-07 DIAGNOSIS — I052 Rheumatic mitral stenosis with insufficiency: Secondary | ICD-10-CM | POA: Insufficient documentation

## 2016-04-07 HISTORY — DX: Unspecified asthma, uncomplicated: J45.909

## 2016-04-07 LAB — BASIC METABOLIC PANEL
Anion gap: 14 (ref 5–15)
BUN: 60 mg/dL — ABNORMAL HIGH (ref 6–20)
CALCIUM: 8.7 mg/dL — AB (ref 8.9–10.3)
CO2: 23 mmol/L (ref 22–32)
CREATININE: 8.91 mg/dL — AB (ref 0.44–1.00)
Chloride: 98 mmol/L — ABNORMAL LOW (ref 101–111)
GFR calc non Af Amer: 4 mL/min — ABNORMAL LOW (ref >60)
GFR, EST AFRICAN AMERICAN: 5 mL/min — AB (ref >60)
GLUCOSE: 93 mg/dL (ref 65–99)
Potassium: 4.8 mmol/L (ref 3.5–5.1)
Sodium: 135 mmol/L (ref 135–145)

## 2016-04-07 LAB — PROTIME-INR
INR: 1.09
PROTHROMBIN TIME: 14.3 s (ref 11.4–15.0)

## 2016-04-07 LAB — HEPATIC FUNCTION PANEL
ALK PHOS: 86 U/L (ref 38–126)
ALT: 39 U/L (ref 14–54)
AST: 77 U/L — ABNORMAL HIGH (ref 15–41)
Albumin: 2.6 g/dL — ABNORMAL LOW (ref 3.5–5.0)
BILIRUBIN INDIRECT: 0.8 mg/dL (ref 0.3–0.9)
BILIRUBIN TOTAL: 1.1 mg/dL (ref 0.3–1.2)
Bilirubin, Direct: 0.3 mg/dL (ref 0.1–0.5)
Total Protein: 6.3 g/dL — ABNORMAL LOW (ref 6.5–8.1)

## 2016-04-07 LAB — CBC WITH DIFFERENTIAL/PLATELET
Basophils Absolute: 0.1 10*3/uL (ref 0–0.1)
Basophils Relative: 1 %
EOS ABS: 0.5 10*3/uL (ref 0–0.7)
EOS PCT: 6 %
HCT: 28.7 % — ABNORMAL LOW (ref 35.0–47.0)
Hemoglobin: 9.4 g/dL — ABNORMAL LOW (ref 12.0–16.0)
LYMPHS ABS: 0.5 10*3/uL — AB (ref 1.0–3.6)
Lymphocytes Relative: 7 %
MCH: 27.7 pg (ref 26.0–34.0)
MCHC: 32.8 g/dL (ref 32.0–36.0)
MCV: 84.5 fL (ref 80.0–100.0)
MONOS PCT: 7 %
Monocytes Absolute: 0.5 10*3/uL (ref 0.2–0.9)
Neutro Abs: 6.1 10*3/uL (ref 1.4–6.5)
Neutrophils Relative %: 79 %
PLATELETS: 212 10*3/uL (ref 150–440)
RBC: 3.4 MIL/uL — ABNORMAL LOW (ref 3.80–5.20)
RDW: 17.9 % — ABNORMAL HIGH (ref 11.5–14.5)
WBC: 7.7 10*3/uL (ref 3.6–11.0)

## 2016-04-07 LAB — TROPONIN I: Troponin I: 0.08 ng/mL — ABNORMAL HIGH (ref <0.031)

## 2016-04-07 LAB — LIPASE, BLOOD: Lipase: 40 U/L (ref 11–51)

## 2016-04-07 MED ORDER — IOPAMIDOL (ISOVUE-370) INJECTION 76%
75.0000 mL | Freq: Once | INTRAVENOUS | Status: AC | PRN
  Administered 2016-04-07: 75 mL via INTRAVENOUS

## 2016-04-07 MED ORDER — MORPHINE SULFATE (PF) 2 MG/ML IV SOLN
2.0000 mg | Freq: Once | INTRAVENOUS | Status: AC
  Administered 2016-04-08: 2 mg via INTRAVENOUS
  Filled 2016-04-07: qty 1

## 2016-04-07 MED ORDER — MORPHINE SULFATE (PF) 2 MG/ML IV SOLN
2.0000 mg | Freq: Once | INTRAVENOUS | Status: AC
Start: 2016-04-07 — End: 2016-04-07
  Administered 2016-04-07: 2 mg via INTRAVENOUS
  Filled 2016-04-07: qty 1

## 2016-04-07 MED ORDER — MORPHINE SULFATE (PF) 2 MG/ML IV SOLN
2.0000 mg | Freq: Once | INTRAVENOUS | Status: AC
  Administered 2016-04-07: 2 mg via INTRAVENOUS
  Filled 2016-04-07: qty 1

## 2016-04-07 MED ORDER — ONDANSETRON HCL 4 MG/2ML IJ SOLN
4.0000 mg | Freq: Once | INTRAMUSCULAR | Status: AC
  Administered 2016-04-07: 4 mg via INTRAVENOUS
  Filled 2016-04-07: qty 2

## 2016-04-07 MED ORDER — DIATRIZOATE MEGLUMINE & SODIUM 66-10 % PO SOLN
15.0000 mL | ORAL | Status: AC
  Administered 2016-04-07: 15 mL via ORAL
  Filled 2016-04-07: qty 30

## 2016-04-07 NOTE — ED Notes (Signed)
Pt states she is unable to urinate at this time, pt refuses in/out cath at this time as well.  MD notified.

## 2016-04-07 NOTE — ED Notes (Signed)
Lab notified to add on lipase, hepatic function.

## 2016-04-07 NOTE — ED Notes (Signed)
Called CT, pt finished with contrast.

## 2016-04-07 NOTE — ED Notes (Signed)
Pt unable to lay flat for CT scan; MD notified.

## 2016-04-07 NOTE — ED Notes (Signed)
Pt refusing to drink second bottle gastrografin; CT notified.

## 2016-04-07 NOTE — ED Notes (Signed)
Pt in via EMS; EMS reports pt w/ altered mental status per family.  Pt alert to self only; poor historian at this time, waiting on family to arrive for more information.  Pt presents with a CHF catheter to right lower chest placed approximately 3 weeks ago; area dressed.  Pt also with peritoneal dialysis catheter to left abdomen.  Pt reports pain "all over" while pointing to chest.

## 2016-04-07 NOTE — ED Provider Notes (Signed)
[facility] Emergency Department Provider Note   ____________________________________________  Time seen: Approximately 4 15 pm I have reviewed the triage vital signs and the triage nursing note.  HISTORY  Chief Complaint Altered Mental Status   Historian Patient somewhat limited historian Some additional history from spouse and father at bedside  HPI Dorothy Long is a 55 y.o. female who had a chest catheter placed for intermittent drainage of pleural effusion, reportedly for CHF, in March, who is here for evaluation of pain at the right side chest near the location of that tube. Patient states that over the last week she's had increasing pain there and is currently 10 on 10. Some also increased shortness of breath. No palpitations. No fever, although they think maybe she's had a low grade temperature. No vomiting or diarrhea. No abdominal pain. No skin rash. No change in the fluid output, they use Vacutainer every other day, and somewhere between 0.5 mL and 300 mL. Yesterday 200 mL came out.    Past Medical History  Diagnosis Date  . Hypertension   . Hyperlipidemia   . COPD (chronic obstructive pulmonary disease) (HCC)     a. not on home O2  . Anemia   . Valvular disease     a. 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 echo 2015: EF 45-50%, borderline LVH, mod dilated LA, mildly dilated RA, small pericardial effusion, mod MR (rheumatic deformity), MS mild-mod, no gradient, PASP ele  . Chronic combined systolic and diastolic CHF (congestive heart failure) (HCC)   . PAF (paroxysmal atrial fibrillation) (HCC)     a. in setting of diarrhea 09/21/2013; b. not on long term full dose anticoagulation; c. CHADSVASC at least 4 (CHF, HTN, DM, female)  . Diabetes mellitus (HCC)   .  Peritoneal dialysis status (HCC) 4- 15-15  . Ventral hernia   . Inguinal hernia   . ESRD (end stage renal disease) on dialysis (HCC)     a. HD on T,T,S; b. 2/2 focal segmental glomerulosclerosis   . Dialysis patient (HCC)   . Renal insufficiency   . Stroke [facility]) 11/2015    a. MRI L cereblal infarctions in the MCA/PCA, ACA/MCA territory; b. felt to be carotid in etiology; c. medically managed on DAPT per neuro  . Anemia in chronic kidney disease   . Aortic arch atherosclerosis (HCC)   . Carotid stenosis   . Chronic kidney disease with peritoneal dialysis as preferred modality, stage 5 (HCC) 07/14/2013    Secondary to FSGS with atrophic right kidney.    . 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 GERD (gastroesophageal reflux disease)   . Hyperparathyroidism due to renal insufficiency (HCC)   . Mitral valve stenosis, severe   . Mitral stenosis with incompetence or regurgitation     EF 45 to 50% Rheumatic mitral valve Mild mitral stenosis. Mean gradient  14 mm Hg Moderate mitral regirg Severe thickening and calcificatin of posterior mitral leaglet.  Mild aortic regurgitation , sclerosis without stenosis Mild tricuspid regurgitation ((*Per ECHO report July 25, [facility])   . Rheumatic mitral stenosis with regurgitation   . NSTEMI (non-ST elevated myocardial infarction) (HCC)   . Paroxysmal atrial fibrillation (HCC) 09/25/2013  . Pleural effusion on right   . TIA (transient ischemic attack) 12/11/2015  . Upper GI bleed 02/06/2016  Patient Active Problem List   Diagnosis Date Noted  . Hospital discharge follow-up 03/18/2016  . Poor dentition 03/12/2016  . Protein-calorie malnutrition, severe 03/12/2016  . Congestive heart failure (CHF) (HCC)   . Mitral stenosis 03/11/2016  . Pleural effusion, right   . Rheumatic mitral stenosis with regurgitation   . Pleural effusion on right   . Shortness of breath   . Mitral valve stenosis, severe   . Coronary artery disease involving  native coronary artery of native heart without angina pectoris   . Aortic arch atherosclerosis (HCC)   . Acute blood loss anemia 02/18/2016  . Upper GI bleed 02/06/2016  . Aphasia   . Mitral valve stenosis   . Congestive dilated cardiomyopathy (HCC)   . Carotid stenosis   . 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 ESRD (end stage renal disease) on dialysis (HCC) 12/11/2015  . TIA (transient ischemic attack) 12/11/2015  . Acute CVA (cerebrovascular accident) (HCC)   . NSTEMI (non-ST elevated myocardial infarction) (HCC)   . Syncope 11/20/2015  . Carotid artery narrowing   . History of CVA (cerebrovascular accident)   . Nosebleed 11/13/2015  . CHF, acute on chronic secondary to valvular heart disease   . Fatty liver 06/22/2015  . Nausea & vomiting 06/22/2015  . SBP (spontaneous bacterial peritonitis) (HCC) 06/22/2015  . Chest pain 06/15/2015  . TMJ (sprain of temporomandibular joint) 05/21/2015  . Postmenopausal atrophic vaginitis 06/19/2014  . Allergic rhinitis 05/29/2014  . Hyperparathyroidism due to renal insufficiency (HCC) 12/23/2013  . PAF- CHADs VASc=6 09/25/2013  . GERD (gastroesophageal reflux disease) 08/12/2013  . Focal and segmental hyalinosis 08/08/2013  . Mitral stenosis with incompetence or regurgitation 07/19/2013  . Anemia in chronic kidney disease 07/19/2013  . Chronic kidney disease with peritoneal dialysis as preferred modality, stage 5 (HCC) 07/14/2013  . Tobacco abuse counseling 12/20/2012  . Vitamin D deficiency 07/22/2012  . Hyperlipidemia   . Hypertension 07/21/2012  . Family history of colon cancer 07/21/2012  . History of tobacco abuse 07/21/2012    Past Surgical History  Procedure Laterality Date  . Abdominal hysterectomy    . Cholecystectomy    . Knee surgery Right   . Foot surgery Bilateral   . Av fistula placement Left 10-26-13    Dr Gilda Crease  . Peritoneal catheter insertion  03-29-14    Dr Gilda Crease  . Inguinal hernia  repair Left 07/13/2015    Procedure: HERNIA REPAIR INGUINAL ADULT;  Surgeon: Earline Mayotte, MD;  Location: [facility] ORS;  Service: General;  Laterality: Left;  Marland Kitchen Ventral hernia repair N/A 07/13/2015    Procedure: HERNIA REPAIR VENTRAL ADULT;  Surgeon: Earline Mayotte, MD;  Location: [facility] ORS;  Service: General;  Laterality: N/A;  . Cardiac catheterization N/A 03/12/2016    Procedure: Right/Left Heart Cath and Coronary Angiography;  Surgeon: Corky Crafts, MD;  Location: [facility] INVASIVE CV LAB;  Service: Cardiovascular;  Laterality: N/A;  . Chest tube insertion Right 03/13/2016    Procedure: INSERTION PLEURAL DRAINAGE CATHETER;  Surgeon: Purcell Nails, MD;  Location: [facility] OR;  Service: Thoracic;  Laterality: Right;    Current Outpatient Rx  Name  Route  Sig  Dispense  Refill  . acetaminophen (TYLENOL) 325 MG tablet   Oral   Take 650 mg by mouth every 4 (four) hours as needed for mild pain or headache.          . albuterol (PROVENTIL HFA;VENTOLIN HFA) 108 (90 Base) MCG/ACT inhaler   Inhalation  Inhale 2 puffs into the lungs every 6 (six) hours as needed for wheezing or shortness of breath.         Marland Kitchen. aspirin 81 MG chewable tablet   Oral   Chew 81 mg by mouth daily.         Marland Kitchen. atorvastatin (LIPITOR) 80 MG tablet   Oral   Take 80 mg by mouth daily.          . calcitRIOL (ROCALTROL) 0.5 MCG capsule   Oral   Take 0.5 mcg by mouth daily.          . calcium acetate (PHOSLO) 667 MG capsule   Oral   Take 2,001 mg by mouth 3 (three) times daily with meals.         . cinacalcet (SENSIPAR) 30 MG tablet   Oral   Take 30 mg by mouth daily.         Marland Kitchen. docusate sodium (COLACE) 100 MG capsule   Oral   Take 100 mg by mouth daily as needed for mild constipation.          Marland Kitchen. HYDROcodone-acetaminophen (NORCO/VICODIN) 5-325 MG tablet   Oral   Take 1 tablet by mouth every 6 (six) hours as needed for moderate pain.   60 tablet   0   . magnesium oxide (MAG-OX) 400 (241.3 Mg) MG  tablet   Oral   Take 1 tablet (400 mg total) by mouth daily.   30 tablet   0   . ondansetron (ZOFRAN-ODT) 4 MG disintegrating tablet   Oral   Take 4 mg by mouth every 8 (eight) hours as needed for nausea or vomiting.          Marland Kitchen. oxyCODONE (ROXICODONE) 5 MG immediate release tablet   Oral   Take 1 tablet (5 mg total) by mouth every 6 (six) hours as needed for severe pain.   30 tablet   0   . pantoprazole (PROTONIX) 40 MG tablet   Oral   Take 40 mg by mouth 2 (two) times daily.          . sodium chloride 1 g tablet   Oral   Take 2 g by mouth 2 (two) times daily with a meal.         . sucralfate (CARAFATE) 1 GM/10ML suspension   Oral   Take 10 mLs (1 g total) by mouth every 6 (six) hours.   420 mL   0   . tiotropium (SPIRIVA) 18 MCG inhalation capsule   Inhalation   Place 1 capsule (18 mcg total) into inhaler and inhale daily.   30 capsule   0     Allergies Buprenorphine; Ciprocinonide; Demerol; Diflucan; Flagyl; Hydrocodone; Levaquin; Tetracyclines & related; and Valium  Family History  Problem Relation Age of Onset  . Cancer Mother     colon  . Heart disease Father   . Hypertension Father     Social History Social History  Substance Use Topics  . Smoking status: Former Smoker -- 0.00 packs/day for 0 years  . Smokeless tobacco: Never Used  . Alcohol Use: No     Comment: occasional    Review of Systems  Constitutional: Low-grade fevers. Eyes: Negative for visual changes. ENT: Negative for sore throat. Cardiovascular: Positive for chest pain. Respiratory: Positive for shortness of breath. Gastrointestinal: Negative for abdominal pain, vomiting and diarrhea. Genitourinary: Negative for dysuria. Musculoskeletal: Negative for back pain. Skin: Negative for rash. Neurological: Negative for headache. 10 point Review  of Systems otherwise negative ____________________________________________   PHYSICAL EXAM:  VITAL SIGNS: ED Triage Vitals  Enc  Vitals Group     BP --      Pulse --      Resp --      Temp 04/07/16 1557 99 F (37.2 C)     Temp Source 04/07/16 1557 Oral     SpO2 --      Weight 04/07/16 1557 116 lb (52.617 kg)     Height 04/07/16 1557  (1.778 m)     Head Cir --      Peak Flow --      Pain Score 04/07/16 1558 10     Pain Loc --      Pain Edu? --      Excl. in GC? --      Constitutional: Very soft voice, seems a little hesitant about the history, but she is alert and oriented. Without acute distress. Thin, almost cachectic. HEENT   Head: Normocephalic and atraumatic.      Eyes: Conjunctivae are normal. PERRL. Normal extraocular movements.      Ears:         Nose: No congestion/rhinnorhea.   Mouth/Throat: Mucous membranes are moist.   Neck: No stridor. Cardiovascular/Chest: Normal rate, regular rhythm.  No murmurs, rubs, or gallops. Chest wall, right lower chest she has a tube in place, skin is without erythema or induration or drainage. Respiratory: Normal respiratory effort without tachypnea nor retractions. Breath sounds are clear and equal bilaterally. Mild decreased air movement, unclear if this is volitional, cooperation related. Gastrointestinal: Soft. No distention, no guarding, no rebound. Nontender.    Genitourinary/rectal:Deferred Musculoskeletal: Nontender with normal range of motion in all extremities. No joint effusions.  No lower extremity tenderness.  No edema. Neurologic: No facial droop. No slurred speech. No gross or focal neurologic deficits are appreciated. Skin:  Skin is warm, dry and intact. No rash noted.   ____________________________________________   EKG I, Governor Rooks, MD, the attending physician have personally viewed and interpreted all ECGs.  99 bpm. Normal sinus rhythm. Nonspecific intraventricular conduction delay, incomplete right bundle-branch block suspected. Nonspecific ST and T-wave changes. ____________________________________________  LABS  (pertinent positives/negatives)  Basic metabolic panel significant for BUN 16 creatinine 8.91, this is similar to prior baseline Troponin 0.08 White blood count 7.7, hemoglobin 9.4 blood count to 12 INR 1.09  ____________________________________________  RADIOLOGY All Xrays were viewed by me. Imaging interpreted by Radiologist.  Chest two-view:  IMPRESSION: Interval development of a small right pleural effusion with underlying opacities favored to represent atelectasis. Right chest tube remains in place. __________________________________________  PROCEDURES  Procedure(s) performed: None  Critical Care performed: None  ____________________________________________   ED COURSE / ASSESSMENT AND PLAN  Pertinent labs & imaging results that were available during my care of the patient were reviewed by me and considered in my medical decision making (see chart for details).   This patient seems to be a bit of a poor historian, and her husband cc given was the history, and there is some question about whether or not she had altered mental status, but she does answer questions if directly asked. Her husband and father feel like she is at her mental status baseline.  She appears chronically ill overall. She does have tachycardia today with increased respiratory rate and keep his complaining of severe right-sided chest pain at the site of her drainage chest catheter for prior pleural effusion due to CHF.  She's not reported symptoms  of fever or infection. Her chest x-ray shows new loculations in that area.  In terms of her laboratory studies, she does not have an elevated white blood cell count. Her anemia is similar to prior. Her renal function is at baseline similar to prior. Her troponin is minimally elevated at 0.08, and score she is in renal failure, and viable previously she has had minimally elevated troponins in the 0.04 to 0.06 range. Her EKG is essentially unchanged from  prior.  Patient's heart rate rather stay actually increased to the 130s. She intermittently complains of worsening pain and requires IV morphine.  On reexamination, she seemed to be complaining of lower chest or even upper abdominal pain. I'm a little concerned about possibly for PE, as well as visualizing the pleural effusion seen on chest x-ray, as well as imaging through the abdomen for possible abdominal source of severe pain.  I'm adding on a hepatic function panel and as well as lipase.  I discussed this with the patient and her spouse.  I discussed this with the on-call nephrologist, Dr. Thedore Mins, who agreed that the patient does need further evaluation and to proceed with contrasted CTs despite her renal function.  I discussed this case with the hospitalist, and a CTs are without acute surgical issue, Dr. Inocencio Homes, ED physician will pass her care along to the hospitalist.  Patient care transferred to Dr. Inocencio Homes at shift change 9 PM. CT chest and abdomen pending.      CONSULTATIONS:   Nephrologist phone consultation, Dr. Thedore Mins. Hospitalist for admission.   Patient / Family / Caregiver informed of clinical course, medical decision-making process, and agree with plan.    ___________________________________________   FINAL CLINICAL IMPRESSION(S) / ED DIAGNOSES   Final diagnoses:  Chest pain, unspecified chest pain type  Abdominal pain, right lateral              Note: This dictation was prepared with Dragon dictation. Any transcriptional errors that result from this process are unintentional   Governor Rooks, MD 04/07/16 2001

## 2016-04-08 DIAGNOSIS — R52 Pain, unspecified: Secondary | ICD-10-CM | POA: Diagnosis present

## 2016-04-08 LAB — URINALYSIS COMPLETE WITH MICROSCOPIC (ARMC ONLY)
Bacteria, UA: NONE SEEN
Bilirubin Urine: NEGATIVE
Glucose, UA: 150 mg/dL — AB
Hgb urine dipstick: NEGATIVE
Ketones, ur: NEGATIVE mg/dL
Nitrite: NEGATIVE
Protein, ur: >500 mg/dL — AB
Specific Gravity, Urine: 1.016 (ref 1.005–1.030)
Squamous Epithelial / LPF: NONE SEEN
pH: 8 (ref 5.0–8.0)

## 2016-04-08 LAB — HEMOGLOBIN A1C: Hgb A1c MFr Bld: 5.1 % (ref 4.0–6.0)

## 2016-04-08 LAB — TSH: TSH: 3.781 u[IU]/mL (ref 0.350–4.500)

## 2016-04-08 MED ORDER — SUCRALFATE 1 GM/10ML PO SUSP: 1.0000 g | Freq: Four times a day (QID) | ORAL | Status: DC

## 2016-04-08 MED ORDER — ACETAMINOPHEN 650 MG RE SUPP: 650.0000 mg | Freq: Four times a day (QID) | RECTAL | Status: DC | PRN

## 2016-04-08 MED ORDER — HEPARIN 1000 UNIT/ML FOR PERITONEAL DIALYSIS
500.0000 [IU] | INTRAMUSCULAR | Status: DC | PRN
  Filled 2016-04-08: qty 0.5

## 2016-04-08 MED ORDER — PANTOPRAZOLE SODIUM 40 MG PO TBEC: 40.0000 mg | DELAYED_RELEASE_TABLET | Freq: Two times a day (BID) | ORAL | Status: DC

## 2016-04-08 MED ORDER — ONDANSETRON HCL 4 MG PO TABS
4.0000 mg | ORAL_TABLET | Freq: Four times a day (QID) | ORAL | Status: DC | PRN
  Administered 2016-04-10: 4 mg via ORAL
  Filled 2016-04-08: qty 1

## 2016-04-08 MED ORDER — OXYCODONE HCL 5 MG PO TABS
5.0000 mg | ORAL_TABLET | Freq: Four times a day (QID) | ORAL | Status: DC | PRN
  Administered 2016-04-08: 5 mg via ORAL
  Filled 2016-04-08: qty 1

## 2016-04-08 MED ORDER — MAGNESIUM OXIDE 400 (241.3 MG) MG PO TABS
400.0000 mg | ORAL_TABLET | Freq: Every day | ORAL | Status: DC
  Administered 2016-04-08 – 2016-04-11 (×4): 400 mg via ORAL
  Filled 2016-04-08 (×4): qty 1

## 2016-04-08 MED ORDER — MORPHINE SULFATE (PF) 4 MG/ML IV SOLN
4.0000 mg | INTRAVENOUS | Status: DC | PRN
  Administered 2016-04-08 (×2): 4 mg via INTRAVENOUS
  Filled 2016-04-08: qty 1

## 2016-04-08 MED ORDER — ONDANSETRON HCL 4 MG/2ML IJ SOLN
4.0000 mg | Freq: Four times a day (QID) | INTRAMUSCULAR | Status: DC | PRN
  Administered 2016-04-08 – 2016-04-11 (×2): 4 mg via INTRAVENOUS
  Filled 2016-04-08 (×2): qty 2

## 2016-04-08 MED ORDER — CINACALCET HCL 30 MG PO TABS
180.0000 mg | ORAL_TABLET | Freq: Every day | ORAL | Status: DC
  Administered 2016-04-09: 180 mg via ORAL
  Filled 2016-04-08: qty 6

## 2016-04-08 MED ORDER — CINACALCET HCL 30 MG PO TABS
30.0000 mg | ORAL_TABLET | Freq: Every day | ORAL | Status: DC
  Administered 2016-04-08: 30 mg via ORAL
  Filled 2016-04-08: qty 1

## 2016-04-08 MED ORDER — ALBUTEROL SULFATE (2.5 MG/3ML) 0.083% IN NEBU: 3.0000 mL | INHALATION_SOLUTION | Freq: Four times a day (QID) | RESPIRATORY_TRACT | Status: DC | PRN

## 2016-04-08 MED ORDER — DOCUSATE SODIUM 100 MG PO CAPS
100.0000 mg | ORAL_CAPSULE | Freq: Every day | ORAL | Status: DC | PRN
  Filled 2016-04-08 (×2): qty 1

## 2016-04-08 MED ORDER — DOCUSATE SODIUM 100 MG PO CAPS
100.0000 mg | ORAL_CAPSULE | Freq: Two times a day (BID) | ORAL | Status: DC
  Administered 2016-04-08 – 2016-04-11 (×7): 100 mg via ORAL
  Filled 2016-04-08 (×5): qty 1

## 2016-04-08 MED ORDER — DELFLEX-LC/2.5% DEXTROSE 394 MOSM/L IP SOLN
INTRAPERITONEAL | Status: DC
  Administered 2016-04-08: 8 L via INTRAPERITONEAL
  Filled 2016-04-08 (×4): qty 3000

## 2016-04-08 MED ORDER — SUCRALFATE 1 GM/10ML PO SUSP
1.0000 g | Freq: Three times a day (TID) | ORAL | Status: DC
  Administered 2016-04-08 – 2016-04-11 (×12): 1 g via ORAL
  Filled 2016-04-08 (×13): qty 10

## 2016-04-08 MED ORDER — PANTOPRAZOLE SODIUM 40 MG PO TBEC
40.0000 mg | DELAYED_RELEASE_TABLET | Freq: Two times a day (BID) | ORAL | Status: DC
  Administered 2016-04-08 – 2016-04-11 (×6): 40 mg via ORAL
  Filled 2016-04-08 (×7): qty 1

## 2016-04-08 MED ORDER — GENTAMICIN SULFATE 0.1 % EX CREA
1.0000 "application " | TOPICAL_CREAM | Freq: Every day | CUTANEOUS | Status: DC
  Administered 2016-04-08: 1 via TOPICAL
  Filled 2016-04-08: qty 15

## 2016-04-08 MED ORDER — ONDANSETRON 8 MG PO TBDP: 4.0000 mg | ORAL_TABLET | Freq: Three times a day (TID) | ORAL | Status: DC | PRN

## 2016-04-08 MED ORDER — CALCIUM ACETATE (PHOS BINDER) 667 MG PO CAPS
2001.0000 mg | ORAL_CAPSULE | Freq: Three times a day (TID) | ORAL | Status: DC
  Administered 2016-04-08 – 2016-04-11 (×8): 2001 mg via ORAL
  Filled 2016-04-08 (×11): qty 3

## 2016-04-08 MED ORDER — ATORVASTATIN CALCIUM 20 MG PO TABS
80.0000 mg | ORAL_TABLET | Freq: Every day | ORAL | Status: DC
  Administered 2016-04-09 – 2016-04-10 (×2): 80 mg via ORAL
  Filled 2016-04-08 (×4): qty 4

## 2016-04-08 MED ORDER — CITALOPRAM HYDROBROMIDE 20 MG PO TABS
20.0000 mg | ORAL_TABLET | Freq: Every day | ORAL | Status: DC
  Administered 2016-04-08 – 2016-04-11 (×4): 20 mg via ORAL
  Filled 2016-04-08 (×4): qty 1

## 2016-04-08 MED ORDER — MORPHINE SULFATE (PF) 2 MG/ML IV SOLN
2.0000 mg | INTRAVENOUS | Status: DC | PRN
  Administered 2016-04-08 – 2016-04-09 (×4): 2 mg via INTRAVENOUS
  Filled 2016-04-08 (×4): qty 1

## 2016-04-08 MED ORDER — DELFLEX-LC/4.25% DEXTROSE 483 MOSM/L IP SOLN
INTRAPERITONEAL | Status: DC
  Filled 2016-04-08 (×3): qty 3000

## 2016-04-08 MED ORDER — TIOTROPIUM BROMIDE MONOHYDRATE 18 MCG IN CAPS
18.0000 ug | ORAL_CAPSULE | Freq: Every day | RESPIRATORY_TRACT | Status: DC
  Administered 2016-04-08 – 2016-04-11 (×4): 18 ug via RESPIRATORY_TRACT
  Filled 2016-04-08: qty 5

## 2016-04-08 MED ORDER — ASPIRIN 81 MG PO CHEW
81.0000 mg | CHEWABLE_TABLET | Freq: Every day | ORAL | Status: DC
  Administered 2016-04-08 – 2016-04-11 (×4): 81 mg via ORAL
  Filled 2016-04-08 (×4): qty 1

## 2016-04-08 MED ORDER — ACETAMINOPHEN 325 MG PO TABS: 650.0000 mg | ORAL_TABLET | Freq: Four times a day (QID) | ORAL | Status: DC | PRN

## 2016-04-08 MED ORDER — SODIUM CHLORIDE 1 G PO TABS
2.0000 g | ORAL_TABLET | Freq: Two times a day (BID) | ORAL | Status: DC
  Administered 2016-04-08 – 2016-04-11 (×6): 2 g via ORAL
  Filled 2016-04-08 (×10): qty 2

## 2016-04-08 MED ORDER — MORPHINE SULFATE (PF) 4 MG/ML IV SOLN
INTRAVENOUS | Status: AC
  Administered 2016-04-08: 4 mg via INTRAVENOUS
  Filled 2016-04-08: qty 1

## 2016-04-08 MED ORDER — ATORVASTATIN CALCIUM 20 MG PO TABS: 80.0000 mg | ORAL_TABLET | Freq: Every day | ORAL | Status: DC

## 2016-04-08 MED ORDER — CLOPIDOGREL BISULFATE 75 MG PO TABS
75.0000 mg | ORAL_TABLET | Freq: Every day | ORAL | Status: DC
  Administered 2016-04-08 – 2016-04-11 (×4): 75 mg via ORAL
  Filled 2016-04-08 (×4): qty 1

## 2016-04-08 MED ORDER — CALCITRIOL 0.25 MCG PO CAPS
0.5000 ug | ORAL_CAPSULE | Freq: Every day | ORAL | Status: DC
  Administered 2016-04-08 – 2016-04-11 (×4): 0.5 ug via ORAL
  Filled 2016-04-08: qty 2
  Filled 2016-04-08: qty 1
  Filled 2016-04-08: qty 2
  Filled 2016-04-08: qty 1
  Filled 2016-04-08: qty 2

## 2016-04-08 NOTE — H&P (Signed)
Dorothy Long is an 55 y.o. female.   Chief Complaint: Abdominal pain HPI: The patient presents emergency department complaining of abdominal pain. Past medical history significant for end-stage renal disease on peritoneal dialysis. The patient also has cirrhosis with recurrent ascites. She has recently undergone placement of a Pleurx catheter 2 drain chronic effusion. Today, she complains of pain that began approximately 7 hours prior to admission that appears to radiate from the catheter insertion site to her back or her abdomen. The patient's complaint seems to vary and she is not clear on how the pain originates. She admits that she has been coughing some lately which may have exacerbated pain around the catheter or in her abdomen. The patient received multiple doses of analgesic in the emergency department without significant relief which prompted emergency department staff to call for admission.  Past Medical History  Diagnosis Date  . Hypertension   . Hyperlipidemia   . COPD (chronic obstructive pulmonary disease) (HCC)     a. not on home O2  . Anemia   . Valvular disease     a. 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 echo 2015: EF 45-50%, borderline LVH, mod dilated LA, mildly dilated RA, small pericardial effusion, mod MR (rheumatic deformity), MS mild-mod, no gradient, PASP ele  . Chronic combined systolic and diastolic CHF (congestive heart failure) ([place])   . PAF (paroxysmal atrial fibrillation) ([place])     a. in setting of diarrhea 09/21/2013; b. not on long term full dose anticoagulation; c. CHADSVASC at least 4 (CHF, HTN, DM, female)  . Diabetes mellitus ([place])   . Peritoneal dialysis status (HCC) 4- 15-15  . Ventral hernia   . Inguinal hernia   . ESRD (end stage renal disease) on dialysis ([place])     a.  HD on T,T,S; b. 2/2 focal segmental glomerulosclerosis   . Dialysis patient ([place])   . Renal insufficiency   . Stroke [facility]) 11/2015    a. MRI L cereblal infarctions in the MCA/PCA, ACA/MCA territory; b. felt to be carotid in etiology; c. medically managed on DAPT per neuro  . Anemia in chronic kidney disease   . Aortic arch atherosclerosis ([place])   . Carotid stenosis   . Chronic kidney disease with peritoneal dialysis as preferred modality, stage 5 ([place]) 07/14/2013    Secondary to FSGS with atrophic right kidney.    . 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 GERD (gastroesophageal reflux disease)   . Hyperparathyroidism due to renal insufficiency ([place])   . Mitral valve stenosis, severe   . Mitral stenosis with incompetence or regurgitation     EF 45 to 50% Rheumatic mitral valve Mild mitral stenosis. Mean gradient  14 mm Hg Moderate mitral regirg Severe thickening and calcificatin of posterior mitral leaglet.  Mild aortic regurgitation , sclerosis without stenosis Mild tricuspid regurgitation ((*Per ECHO report July 25, [facility])   . Rheumatic mitral stenosis with regurgitation   . NSTEMI (non-ST elevated myocardial infarction) ([place])   . Paroxysmal atrial fibrillation ([place]) 09/25/2013  . Pleural effusion on right   . TIA (transient ischemic attack) 12/11/2015  . Upper GI bleed 02/06/2016  . Asthma     Past Surgical History  Procedure Laterality Date  . Abdominal hysterectomy    . Cholecystectomy    . Knee surgery Right   .  Foot surgery Bilateral   . Av fistula placement Left 10-26-13    Dr Delana Meyer  . Peritoneal catheter insertion  03-29-14    Dr Delana Meyer  . Inguinal hernia repair Left 07/13/2015    Procedure: HERNIA REPAIR INGUINAL ADULT;  Surgeon: Robert Bellow, MD;  Location: [facility] ORS;  Service: General;  Laterality: Left;  Marland Kitchen Ventral hernia repair N/A 07/13/2015    Procedure: HERNIA REPAIR VENTRAL ADULT;  Surgeon: Robert Bellow, MD;  Location: [facility] ORS;   Service: General;  Laterality: N/A;  . Cardiac catheterization N/A 03/12/2016    Procedure: Right/Left Heart Cath and Coronary Angiography;  Surgeon: Jettie Booze, MD;  Location: [facility];  Service: Cardiovascular;  Laterality: N/A;  . Chest tube insertion Right 03/13/2016    Procedure: INSERTION PLEURAL DRAINAGE CATHETER;  Surgeon: Rexene Alberts, MD;  Location: [facility];  Service: Thoracic;  Laterality: Right;    Family History  Problem Relation Age of Onset  . Cancer Mother     colon  . Heart disease Father   . Hypertension Father    Social History:  reports that she has quit smoking. She has never used smokeless tobacco. She reports that she does not drink alcohol or use illicit drugs.  Allergies:  Allergies  Allergen Reactions  . Buprenorphine Nausea And Vomiting  . Ciprocinonide [Fluocinolone] Hives  . Demerol [Meperidine] Nausea And Vomiting  . Diflucan [Fluconazole] Nausea And Vomiting  . Flagyl [Metronidazole] Hives and Itching  . Hydrocodone Itching  . Levaquin [Levofloxacin] Other (See Comments)    Reaction: Numbness in legs   . Tetracyclines & Related Itching and Swelling  . Valium [Diazepam] Nausea And Vomiting and Other (See Comments)    Reaction:  Hallucinations     Medications Prior to Admission  Medication Sig Dispense Refill  . acetaminophen (TYLENOL) 325 MG tablet Take 650 mg by mouth every 4 (four) hours as needed for mild pain or headache.     . albuterol (PROVENTIL HFA;VENTOLIN HFA) 108 (90 Base) MCG/ACT inhaler Inhale 2 puffs into the lungs every 6 (six) hours as needed for wheezing or shortness of breath.    Marland Kitchen aspirin 81 MG chewable tablet Chew 81 mg by mouth daily.    Marland Kitchen atorvastatin (LIPITOR) 80 MG tablet Take 80 mg by mouth daily.     . calcitRIOL (ROCALTROL) 0.5 MCG capsule Take 0.5 mcg by mouth daily.     . calcium acetate (PHOSLO) 667 MG capsule Take 2,001 mg by mouth 3 (three) times daily with meals.    . cinacalcet (SENSIPAR) 30 MG  tablet Take 30 mg by mouth daily with breakfast.     . citalopram (CELEXA) 20 MG tablet Take 20 mg by mouth daily.    . clopidogrel (PLAVIX) 75 MG tablet Take 75 mg by mouth daily.    Marland Kitchen docusate sodium (COLACE) 100 MG capsule Take 100 mg by mouth daily as needed for mild constipation.     Marland Kitchen epoetin alfa (EPOGEN,PROCRIT) 44818 UNIT/ML injection Inject 25,000 Units into the skin once a week.    . magnesium oxide (MAG-OX) 400 (241.3 Mg) MG tablet Take 1 tablet (400 mg total) by mouth daily. 30 tablet 0  . ondansetron (ZOFRAN-ODT) 4 MG disintegrating tablet Take 4 mg by mouth every 8 (eight) hours as needed for nausea or vomiting.     Marland Kitchen oxyCODONE (ROXICODONE) 5 MG immediate release tablet Take 1 tablet (5 mg total) by mouth every 6 (six) hours as needed for severe  pain. 30 tablet 0  . pantoprazole (PROTONIX) 40 MG tablet Take 40 mg by mouth 2 (two) times daily.     . sodium chloride 1 g tablet Take 2 g by mouth 2 (two) times daily with a meal.    . sucralfate (CARAFATE) 1 GM/10ML suspension Take 10 mLs (1 g total) by mouth every 6 (six) hours. 420 mL 0  . tiotropium (SPIRIVA) 18 MCG inhalation capsule Place 1 capsule (18 mcg total) into inhaler and inhale daily. 30 capsule 0  . HYDROcodone-acetaminophen (NORCO/VICODIN) 5-325 MG tablet Take 1 tablet by mouth every 6 (six) hours as needed for moderate pain. (Patient not taking: Reported on 04/07/2016) 60 tablet 0    Results for orders placed or performed during the hospital encounter of 04/07/16 (from the past 48 hour(s))  Basic metabolic panel     Status: Abnormal   Collection Time: 04/07/16  4:12 PM  Result Value Ref Range   Sodium 135 135 - 145 mmol/L   Potassium 4.8 3.5 - 5.1 mmol/L    Comment: HEMOLYSIS AT THIS LEVEL MAY AFFECT RESULT   Chloride 98 (L) 101 - 111 mmol/L   CO2 23 22 - 32 mmol/L   Glucose, Bld 93 65 - 99 mg/dL   BUN 60 (H) 6 - 20 mg/dL   Creatinine, Ser 8.91 (H) 0.44 - 1.00 mg/dL   Calcium 8.7 (L) 8.9 - 10.3 mg/dL   GFR calc  non Af Amer 4 (L) >60 mL/min   GFR calc Af Amer 5 (L) >60 mL/min    Comment: (NOTE) The eGFR has been calculated using the CKD EPI equation. This calculation has not been validated in all clinical situations. eGFR's persistently <60 mL/min signify possible Chronic Kidney Disease.    Anion gap 14 5 - 15  Troponin I     Status: Abnormal   Collection Time: 04/07/16  4:12 PM  Result Value Ref Range   Troponin I 0.08 (H) <0.031 ng/mL    Comment: READ BACK AND VERIFIED WITH ASHLEY SMITH AT 8502 04/07/16 SDR        PERSISTENTLY INCREASED TROPONIN VALUES IN THE RANGE OF 0.04-0.49 ng/mL CAN BE SEEN IN:       -UNSTABLE ANGINA       -CONGESTIVE HEART FAILURE       -MYOCARDITIS       -CHEST TRAUMA       -ARRYHTHMIAS       -LATE PRESENTING MYOCARDIAL INFARCTION       -COPD   CLINICAL FOLLOW-UP RECOMMENDED.   CBC with Differential     Status: Abnormal   Collection Time: 04/07/16  4:12 PM  Result Value Ref Range   WBC 7.7 3.6 - 11.0 K/uL   RBC 3.40 (L) 3.80 - 5.20 MIL/uL   Hemoglobin 9.4 (L) 12.0 - 16.0 g/dL   HCT 28.7 (L) 35.0 - 47.0 %   MCV 84.5 80.0 - 100.0 fL   MCH 27.7 26.0 - 34.0 pg   MCHC 32.8 32.0 - 36.0 g/dL   RDW 17.9 (H) 11.5 - 14.5 %   Platelets 212 150 - 440 K/uL   Neutrophils Relative % 79 %   Neutro Abs 6.1 1.4 - 6.5 K/uL   Lymphocytes Relative 7 %   Lymphs Abs 0.5 (L) 1.0 - 3.6 K/uL   Monocytes Relative 7 %   Monocytes Absolute 0.5 0.2 - 0.9 K/uL   Eosinophils Relative 6 %   Eosinophils Absolute 0.5 0 - 0.7 K/uL   Basophils Relative  1 %   Basophils Absolute 0.1 0 - 0.1 K/uL  Protime-INR     Status: None   Collection Time: 04/07/16  4:12 PM  Result Value Ref Range   Prothrombin Time 14.3 11.4 - 15.0 seconds   INR 1.09   Lipase, blood     Status: None   Collection Time: 04/07/16  4:12 PM  Result Value Ref Range   Lipase 40 11 - 51 U/L  Hepatic function panel     Status: Abnormal   Collection Time: 04/07/16  4:12 PM  Result Value Ref Range   Total Protein  6.3 (L) 6.5 - 8.1 g/dL   Albumin 2.6 (L) 3.5 - 5.0 g/dL   AST 77 (H) 15 - 41 U/L    Comment: HEMOLYSIS AT THIS LEVEL MAY AFFECT RESULT   ALT 39 14 - 54 U/L   Alkaline Phosphatase 86 38 - 126 U/L   Total Bilirubin 1.1 0.3 - 1.2 mg/dL    Comment: HEMOLYSIS AT THIS LEVEL MAY AFFECT RESULT   Bilirubin, Direct 0.3 0.1 - 0.5 mg/dL    Comment: HEMOLYSIS AT THIS LEVEL MAY AFFECT RESULT   Indirect Bilirubin 0.8 0.3 - 0.9 mg/dL   Dg Chest 2 View  04/07/2016  CLINICAL DATA:  Patient with altered mental status. History of congestive heart failure. Right chest tube in place for 3 weeks. EXAM: CHEST  2 VIEW COMPARISON:  Chest radiograph 03/13/2016. FINDINGS: Monitoring leads overlie the patient. Stable cardiomegaly. Small bore right chest tube remains in position. Interval development of a small right pleural effusion. Heterogeneous opacities right lung base. No pneumothorax. Thoracic spine degenerative changes. IMPRESSION: Interval development of a small right pleural effusion with underlying opacities favored to represent atelectasis. Right chest tube remains in place. Electronically Signed   By: Lovey Newcomer M.D.   On: 04/07/2016 16:41   Ct Angio Chest Pe W/cm &/or Wo Cm  04/08/2016  CLINICAL DATA:  Right-sided abdominal pain. Right-sided chest pain at site of tunneled pleural catheter. EXAM: CT ANGIOGRAPHY CHEST CT ABDOMEN AND PELVIS WITH CONTRAST TECHNIQUE: Multidetector CT imaging of the chest was performed using the standard protocol during bolus administration of intravenous contrast. Multiplanar CT image reconstructions and MIPs were obtained to evaluate the vascular anatomy. Multidetector CT imaging of the abdomen and pelvis was performed using the standard protocol during bolus administration of intravenous contrast. CONTRAST:  75 cc Isovue 370 intravenous COMPARISON:  Abdominal CT 02/08/2016 FINDINGS: CTA CHEST FINDINGS THORACIC INLET/BODY WALL: No acute finding. MEDIASTINUM: Cardiomegaly with left  atrial and right heart dilatation. Mitral valve calcification which is bulky and may be caseous. Extensive atherosclerosis. Pulmonary hypertension with dilated pulmonary arterial tree. Intermittent motion degradation. No suspected acute pulmonary embolism. No acute aortic finding. LUNG WINDOWS: Moderate right pleural effusion with some posterior and fissural loculation but no pleural thickening or nodularity. A tunneled pleural catheter is in unremarkable position, contiguous with pleural fluid. Mild fluid in the chest wall at site of catheter insertion, without rim enhancing collection. Dependent atelectasis, greater on the right. Mild septal thickening at the apices but no overt pulmonary edema. 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 CT ABDOMEN and PELVIS FINDINGS Abdominal wall:  No contributory findings. Hepatobiliary: Cirrhotic morphology. Nutmeg pattern enhancement attributed to passive congestion.Cholecystectomy with negative common bile duct. Pancreas: Unremarkable. Spleen: Unremarkable. Adrenals/Urinary Tract: Negative adrenals. Marked renal atrophy with absent enhancement. Unremarkable bladder. Reproductive:Hysterectomy.  Negative adnexa. Stomach/Bowel:  No obstruction. No inflammatory changes. Vascular/Lymphatic: No acute vascular abnormality. IVC filter in place.  Extensive atherosclerosis. High-grade left common iliac artery stenosis from bulky calcified plaque. No mass or adenopathy. Peritoneal: Small volume ascites/dialysate. Tenckhoff catheter in the right pelvis. 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 Review of the MIP images confirms the above findings. IMPRESSION: 1. No acute finding or change since prior. Negative for pulmonary embolism. 2. Moderate right pleural effusion with tunneled pleural catheter in place. 3. Cirrhosis. 4. Additional chronic findings are described above. Electronically Signed   By: Monte Fantasia M.D.   On: 04/08/2016  00:14   Ct Abdomen Pelvis W Contrast  04/08/2016  CLINICAL DATA:  Right-sided abdominal pain. Right-sided chest pain at site of tunneled pleural catheter. EXAM: CT ANGIOGRAPHY CHEST CT ABDOMEN AND PELVIS WITH CONTRAST TECHNIQUE: Multidetector CT imaging of the chest was performed using the standard protocol during bolus administration of intravenous contrast. Multiplanar CT image reconstructions and MIPs were obtained to evaluate the vascular anatomy. Multidetector CT imaging of the abdomen and pelvis was performed using the standard protocol during bolus administration of intravenous contrast. CONTRAST:  75 cc Isovue 370 intravenous COMPARISON:  Abdominal CT 02/08/2016 FINDINGS: CTA CHEST FINDINGS THORACIC INLET/BODY WALL: No acute finding. MEDIASTINUM: Cardiomegaly with left atrial and right heart dilatation. Mitral valve calcification which is bulky and may be caseous. Extensive atherosclerosis. Pulmonary hypertension with dilated pulmonary arterial tree. Intermittent motion degradation. No suspected acute pulmonary embolism. No acute aortic finding. LUNG WINDOWS: Moderate right pleural effusion with some posterior and fissural loculation but no pleural thickening or nodularity. A tunneled pleural catheter is in unremarkable position, contiguous with pleural fluid. Mild fluid in the chest wall at site of catheter insertion, without rim enhancing collection. Dependent atelectasis, greater on the right. Mild septal thickening at the apices but no overt pulmonary edema. 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 CT ABDOMEN and PELVIS FINDINGS Abdominal wall:  No contributory findings. Hepatobiliary: Cirrhotic morphology. Nutmeg pattern enhancement attributed to passive congestion.Cholecystectomy with negative common bile duct. Pancreas: Unremarkable. Spleen: Unremarkable. Adrenals/Urinary Tract: Negative adrenals. Marked renal atrophy with absent enhancement. Unremarkable bladder.  Reproductive:Hysterectomy.  Negative adnexa. Stomach/Bowel:  No obstruction. No inflammatory changes. Vascular/Lymphatic: No acute vascular abnormality. IVC filter in place. Extensive atherosclerosis. High-grade left common iliac artery stenosis from bulky calcified plaque. No mass or adenopathy. Peritoneal: Small volume ascites/dialysate. Tenckhoff catheter in the right pelvis. 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 Review of the MIP images confirms the above findings. IMPRESSION: 1. No acute finding or change since prior. Negative for pulmonary embolism. 2. Moderate right pleural effusion with tunneled pleural catheter in place. 3. Cirrhosis. 4. Additional chronic findings are described above. Electronically Signed   By: Monte Fantasia M.D.   On: 04/08/2016 00:14    Review of Systems  Constitutional: Negative for fever and chills.  HENT: Negative for sore throat and tinnitus.   Eyes: Negative for blurred vision and redness.  Respiratory: Negative for cough and shortness of breath.   Cardiovascular: Negative for chest pain, palpitations, orthopnea and PND.  Gastrointestinal: Positive for abdominal pain. Negative for nausea, vomiting and diarrhea.  Genitourinary: Negative for dysuria, urgency and frequency.  Musculoskeletal: Negative for myalgias and joint pain.  Skin: Negative for rash.       No lesions  Neurological: Negative for speech change, focal weakness and weakness.  Endo/Heme/Allergies: Does not bruise/bleed easily.       No temperature intolerance  Psychiatric/Behavioral: Negative for depression and suicidal ideas.    Blood pressure 114/91, pulse 118, temperature 98.1 F (36.7 C), temperature source Oral,  resp. rate 16, height 5' 10"  (1.778 m), weight 52.617 kg (116 lb), SpO2 93 %. Physical Exam  Vitals reviewed. Constitutional: She is oriented to person, place, and time. She appears well-developed and well-nourished. No distress.  HENT:  Head:  Normocephalic and atraumatic.  Mouth/Throat: Oropharynx is clear and moist.  Eyes: Conjunctivae and EOM are normal. Pupils are equal, round, and reactive to light. No scleral icterus.  Neck: Normal range of motion. Neck supple. No JVD present. No tracheal deviation present. No thyromegaly present.  Cardiovascular: Normal rate, regular rhythm and normal heart sounds.  Exam reveals no gallop and no friction rub.   No murmur heard. Respiratory: Effort normal and breath sounds normal.  GI: Soft. Bowel sounds are normal. She exhibits no distension and no mass. There is tenderness. There is guarding (Voluntary). There is no rebound.  Genitourinary:  Deferred  Musculoskeletal: Normal range of motion. She exhibits no edema.  Lymphadenopathy:    She has no cervical adenopathy.  Neurological: She is alert and oriented to person, place, and time. No cranial nerve deficit. She exhibits normal muscle tone.  Skin: Skin is warm and dry. No rash noted. No erythema.  Psychiatric: She has a normal mood and affect. Her behavior is normal. Judgment and thought content normal.     Assessment/Plan This is a 55 year old female admitted for intractable abdominal pain. 1. Intractable pain: We will try to manage the patient's pain as best we can. She has no indications of cellulitis nor acute peritoneal signs. Continue to monitor. 2. Chest pain: Reproducible at site of catheter. The patient endorses no anginal signs. Unlikely that pain represents anginal equivalents. Initial troponin is mildly elevated likely secondary to poor renal clearance. If the patient starts to complain of chest pain greater then abdominal pain we will obtain more cardiac enzymes. For now continue aspirin. 3. CAD: Stable. Continue Plavix 4. COPD: Continue inhaled corticosteroid and Spiriva 5. Hyperlipidemia: Continue statin therapy 6. End-stage renal disease: Continue PhosLo and Sensipar. Consult nephrology for continuation of peritoneal  dialysis if the patient's hospital stay will be prolonged 7. DVT prophylaxis: SCDs 8. GI prophylaxis: Pantoprazole per home regimen The patient is a full code. Time spent on admission orders and patient care approximately 45 minutes  Harrie Foreman, MD 04/08/2016, 5:00 AM

## 2016-04-08 NOTE — Progress Notes (Addendum)
Initial Nutrition Assessment  DOCUMENTATION CODES:   Severe malnutrition in context of chronic illness  INTERVENTION:  Monitor intake and recommend liberalizing diet to regular secondary to malnutrition and poor po intake.  Recommend Magic cup BID with meals, each supplement provides 290 kcal and 9 grams of protein. Pt shook head no to nepro, ensure supplements    NUTRITION DIAGNOSIS:   Predicted suboptimal nutrient intake related to chronic illness as evidenced by moderate depletion of body fat, moderate depletions of muscle mass, severe depletion of muscle mass, severe depletion of body fat.    GOAL:   Patient will meet greater than or equal to 90% of their needs    MONITOR:   PO intake, Supplement acceptance, Labs  REASON FOR ASSESSMENT:   Malnutrition Screening Tool    ASSESSMENT:   55 y/o female admitted with intractable pain, chest pain, recent pleurx catheter placed.  Past Medical History  Diagnosis Date  . Hypertension   . Hyperlipidemia   . COPD (chronic obstructive pulmonary disease) (HCC)     a. not on home O2  . Anemia   . Valvular disease     a. 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 echo 2015: EF 45-50%, borderline LVH, mod dilated LA, mildly dilated RA, small pericardial effusion, mod MR (rheumatic deformity), MS mild-mod, no gradient, PASP ele  . Chronic combined systolic and diastolic CHF (congestive heart failure) (HCC)   . PAF (paroxysmal atrial fibrillation) (HCC)     a. in setting of diarrhea 09/21/2013; b. not on long term full dose anticoagulation; c. CHADSVASC at least 4 (CHF, HTN, DM, female)  . Diabetes mellitus (HCC)   . Peritoneal dialysis status (HCC) 4- 15-15  . Ventral hernia   . Inguinal hernia   . ESRD (end stage renal disease) on dialysis (HCC)     a. HD  on T,T,S; b. 2/2 focal segmental glomerulosclerosis   . Dialysis patient (HCC)   . Renal insufficiency   . Stroke [facility]) 11/2015    a. MRI L cereblal infarctions in the MCA/PCA, ACA/MCA territory; b. felt to be carotid in etiology; c. medically managed on DAPT per neuro  . Anemia in chronic kidney disease   . Aortic arch atherosclerosis (HCC)   . Carotid stenosis   . Chronic kidney disease with peritoneal dialysis as preferred modality, stage 5 (HCC) 07/14/2013    Secondary to FSGS with atrophic right kidney.    . 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 GERD (gastroesophageal reflux disease)   . Hyperparathyroidism due to renal insufficiency (HCC)   . Mitral valve stenosis, severe   . Mitral stenosis with incompetence or regurgitation     EF 45 to 50% Rheumatic mitral valve Mild mitral stenosis. Mean gradient  14 mm Hg Moderate mitral regirg Severe thickening and calcificatin of posterior mitral leaglet.  Mild aortic regurgitation , sclerosis without stenosis Mild tricuspid regurgitation ((*Per ECHO report July 25, [facility])   . Rheumatic mitral stenosis with regurgitation   . NSTEMI (non-ST elevated myocardial infarction) (HCC)   . Paroxysmal atrial fibrillation (HCC) 09/25/2013  . Pleural effusion on right   . TIA (transient ischemic attack) 12/11/2015  . Upper GI bleed 02/06/2016  . Asthma     Pt lethargic, unable to provide much answers to questions regarding intake prior to admission.  Spoke with husband earlier and reports pt has good appetite. Eats well but likes junk foods. Typically eats eggs, bacon/sausage for breakfast, sandwich for lunch and then unsure what she eats for dinner as he leaves and goes to work.  Medications reviewed phoslo, colace, mag ox, protonix, carafate, sensipar Labs reviewed BUN 60, creatinine 8.91  Nutrition-Focused physical exam completed. Findings are mild/modrate to severe  fat depletion, mild/moderate - severe muscle depletion, and mild  edema.    Diet Order:  Diet Heart Room service appropriate?: Yes; Fluid consistency:: Thin  Skin:  Reviewed, no issues  Last BM:  4/24  Height:   Ht Readings from Last 1 Encounters:  04/08/16 5\' 10"  (1.778 m)    Weight: Husband reports no wt loss   Wt Readings from Last 1 Encounters:  04/08/16 112 lb (50.803 kg)   Wt Readings from Last 10 Encounters:  04/08/16 112 lb (50.803 kg)  03/17/16 111 lb (50.349 kg)  03/13/16 110 lb 7.2 oz (50.1 kg)  03/11/16 108 lb 8 oz (49.215 kg)  02/18/16 120 lb (54.432 kg)  02/12/16 122 lb 6.4 oz (55.52 kg)  12/19/15 98 lb 12.3 oz (44.8 kg)  11/22/15 117 lb (53.071 kg)  11/20/15 111 lb 11.2 oz (50.667 kg)  11/12/15 111 lb 12 oz (50.689 kg)     Ideal Body Weight:     BMI:  Body mass index is 16.07 kg/(m^2).  Estimated Nutritional Needs:   Kcal:  1500-1750 kcals/d  Protein:  60-75 g/d  Fluid:  1000ml + UOP  EDUCATION NEEDS:   Education needs no appropriate at this time  Eural Holzschuh B. Freida BusmanAllen, RD, LDN [phone] (pager) Weekend/On-Call pager [phone])

## 2016-04-08 NOTE — ED Provider Notes (Signed)
-----------------------------------------   12:06 AM on 04/08/2016 -----------------------------------------  CT chest abdomen pelvis still pending. Care transferred to Dr. Zenda AlpersWebster.  Gayla DossEryka A Firmin Belisle, MD 04/08/16 [phone]

## 2016-04-08 NOTE — Progress Notes (Signed)
[facility]  Patient Status: active  [facility] is providing the following services: SN/PT/OT/ST  If patient discharges after hours, please call [phone].   Scherrie GerlachJason E Hinton 04/08/2016, 1:28 PM

## 2016-04-08 NOTE — ED Provider Notes (Signed)
-----------------------------------------   2:08 AM on 04/08/2016 -----------------------------------------   Blood pressure 108/84, pulse 114, temperature 99 F (37.2 C), temperature source Oral, resp. rate 22, height 5\' 10"  (1.778 m), weight 116 lb (52.617 kg), SpO2 93 %.  Assuming care from Dr. Inocencio HomesGayle.  In short, Dorothy Long is a 55 y.o. female with a chief complaint of Altered Mental Status .  Refer to the original H&P for additional details.  The current plan of care is to follow up the results of the CT scan and admit the patient.  CT angio chest, abdomen and pelvis: No acute findings or change since prior, negative for pulmonary embolism, moderate right pleural effusion with tunnel pleural catheter in place, cirrhosis, additional chronic findings are described.  The patient will be admitted to the hospitalist service    Rebecka ApleyAllison P Amantha Sklar, MD 04/08/16 [phone]

## 2016-04-08 NOTE — Progress Notes (Signed)
[employer] [facility] [place] [facility]   PATIENT NAME: Dorothy Long    MR#:  [id number]  DATE OF BIRTH:  19-Sep-1961  SUBJECTIVE:   She was here due to worsening abdominal pain. She is alert but lethargic from the pain medications presently. CT abdomen and pelvis was negative for any acute pathology. No other complaints presently.  REVIEW OF SYSTEMS:    Review of Systems  Constitutional: Negative for fever and chills.  HENT: Negative for congestion and tinnitus.   Eyes: Negative for blurred vision and double vision.  Respiratory: Negative for cough, shortness of breath and wheezing.   Cardiovascular: Negative for chest pain, orthopnea and PND.  Gastrointestinal: Positive for abdominal pain. Negative for nausea, vomiting and diarrhea.  Genitourinary: Negative for dysuria and hematuria.  Neurological: Negative for dizziness, sensory change and focal weakness.  All other systems reviewed and are negative.   Nutrition: Heart healthy Tolerating Diet: Yes Tolerating PT: Await evaluation     DRUG ALLERGIES:   Allergies  Allergen Reactions  . Buprenorphine Nausea And Vomiting  . Ciprocinonide [Fluocinolone] Hives  . Demerol [Meperidine] Nausea And Vomiting  . Diflucan [Fluconazole] Nausea And Vomiting  . Flagyl [Metronidazole] Hives and Itching  . Hydrocodone Itching  . Levaquin [Levofloxacin] Other (See Comments)    Reaction: Numbness in legs   . Tetracyclines & Related Itching and Swelling  . Valium [Diazepam] Nausea And Vomiting and Other (See Comments)    Reaction:  Hallucinations     VITALS:  Blood pressure 116/74, pulse 94, temperature 97.9 F (36.6 C), temperature source Oral, resp. rate 20, height  (1.778 m), weight 50.803 kg (112 lb), SpO2 96 %.  PHYSICAL EXAMINATION:   Physical Exam  GENERAL:  55 y.o.-year-old patient lying in the bed lethargic but in no apparent distress.  EYES: Pupils equal, round, reactive to light and  accommodation. No scleral icterus. Extraocular muscles intact.  HEENT: Head atraumatic, normocephalic. Oropharynx and nasopharynx clear.  NECK:  Supple, no jugular venous distention. No thyroid enlargement, no tenderness.  LUNGS: Normal breath sounds bilaterally, no wheezing, rales, rhonchi. No use of accessory muscles of respiration. Positive right-sided Pleurx catheter in place. CARDIOVASCULAR: S1, S2 normal. 2/6 systolic ejection murmur at the left sternal border, no rubs, or gallops.  ABDOMEN: Soft, nontender, nondistended. Bowel sounds present. No organomegaly or mass. Positive peritoneal dialysis catheter in place without any drainage. EXTREMITIES: No cyanosis, clubbing or edema b/l.    NEUROLOGIC: Cranial nerves II through XII are intact. No focal Motor or sensory deficits b/l. Globally weak  PSYCHIATRIC: The patient is alert and oriented x 3.  SKIN: No obvious rash, lesion, or ulcer.    LABORATORY PANEL:   CBC  Recent Labs Lab 04/07/16 1612  WBC 7.7  HGB 9.4*  HCT 28.7*  PLT 212   ------------------------------------------------------------------------------------------------------------------  Chemistries   Recent Labs Lab 04/07/16 1612  NA 135  K 4.8  CL 98*  CO2 23  GLUCOSE 93  BUN 60*  CREATININE 8.91*  CALCIUM 8.7*  AST 77*  ALT 39  ALKPHOS 86  BILITOT 1.1   ------------------------------------------------------------------------------------------------------------------  Cardiac Enzymes  Recent Labs Lab 04/07/16 1612  TROPONINI 0.08*   ------------------------------------------------------------------------------------------------------------------  RADIOLOGY:  Dg Chest 2 View  04/07/2016  CLINICAL DATA:  Patient with altered mental status. History of congestive heart failure. Right chest tube in place for 3 weeks. EXAM: CHEST  2 VIEW COMPARISON:  Chest radiograph 03/13/2016. FINDINGS: Monitoring leads overlie the patient. Stable cardiomegaly.  Small bore  right chest tube remains in position. Interval development of a small right pleural effusion. Heterogeneous opacities right lung base. No pneumothorax. Thoracic spine degenerative changes. IMPRESSION: Interval development of a small right pleural effusion with underlying opacities favored to represent atelectasis. Right chest tube remains in place. Electronically Signed   By: Annia Belt M.D.   On: 04/07/2016 16:41   Ct Angio Chest Pe W/cm &/or Wo Cm  04/08/2016  CLINICAL DATA:  Right-sided abdominal pain. Right-sided chest pain at site of tunneled pleural catheter. EXAM: CT ANGIOGRAPHY CHEST CT ABDOMEN AND PELVIS WITH CONTRAST TECHNIQUE: Multidetector CT imaging of the chest was performed using the standard protocol during bolus administration of intravenous contrast. Multiplanar CT image reconstructions and MIPs were obtained to evaluate the vascular anatomy. Multidetector CT imaging of the abdomen and pelvis was performed using the standard protocol during bolus administration of intravenous contrast. CONTRAST:  75 cc Isovue 370 intravenous COMPARISON:  Abdominal CT 02/08/2016 FINDINGS: CTA CHEST FINDINGS THORACIC INLET/BODY WALL: No acute finding. MEDIASTINUM: Cardiomegaly with left atrial and right heart dilatation. Mitral valve calcification which is bulky and may be caseous. Extensive atherosclerosis. Pulmonary hypertension with dilated pulmonary arterial tree. Intermittent motion degradation. No suspected acute pulmonary embolism. No acute aortic finding. LUNG WINDOWS: Moderate right pleural effusion with some posterior and fissural loculation but no pleural thickening or nodularity. A tunneled pleural catheter is in unremarkable position, contiguous with pleural fluid. Mild fluid in the chest wall at site of catheter insertion, without rim enhancing collection. Dependent atelectasis, greater on the right. Mild septal thickening at the apices but no overt pulmonary edema. 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 CT ABDOMEN and PELVIS FINDINGS Abdominal wall:  No contributory findings. Hepatobiliary: Cirrhotic morphology. Nutmeg pattern enhancement attributed to passive congestion.Cholecystectomy with negative common bile duct. Pancreas: Unremarkable. Spleen: Unremarkable. Adrenals/Urinary Tract: Negative adrenals. Marked renal atrophy with absent enhancement. Unremarkable bladder. Reproductive:Hysterectomy.  Negative adnexa. Stomach/Bowel:  No obstruction. No inflammatory changes. Vascular/Lymphatic: No acute vascular abnormality. IVC filter in place. Extensive atherosclerosis. High-grade left common iliac artery stenosis from bulky calcified plaque. No mass or adenopathy. Peritoneal: Small volume ascites/dialysate. Tenckhoff catheter in the right pelvis. 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 Review of the MIP images confirms the above findings. IMPRESSION: 1. No acute finding or change since prior. Negative for pulmonary embolism. 2. Moderate right pleural effusion with tunneled pleural catheter in place. 3. Cirrhosis. 4. Additional chronic findings are described above. Electronically Signed   By: Marnee Spring M.D.   On: 04/08/2016 00:14   Ct Abdomen Pelvis W Contrast  04/08/2016  CLINICAL DATA:  Right-sided abdominal pain. Right-sided chest pain at site of tunneled pleural catheter. EXAM: CT ANGIOGRAPHY CHEST CT ABDOMEN AND PELVIS WITH CONTRAST TECHNIQUE: Multidetector CT imaging of the chest was performed using the standard protocol during bolus administration of intravenous contrast. Multiplanar CT image reconstructions and MIPs were obtained to evaluate the vascular anatomy. Multidetector CT imaging of the abdomen and pelvis was performed using the standard protocol during bolus administration of intravenous contrast. CONTRAST:  75 cc Isovue 370 intravenous COMPARISON:  Abdominal CT 02/08/2016 FINDINGS: CTA CHEST FINDINGS THORACIC INLET/BODY  WALL: No acute finding. MEDIASTINUM: Cardiomegaly with left atrial and right heart dilatation. Mitral valve calcification which is bulky and may be caseous. Extensive atherosclerosis. Pulmonary hypertension with dilated pulmonary arterial tree. Intermittent motion degradation. No suspected acute pulmonary embolism. No acute aortic finding. LUNG WINDOWS: Moderate right pleural effusion with some posterior and fissural loculation but no pleural thickening  or nodularity. A tunneled pleural catheter is in unremarkable position, contiguous with pleural fluid. Mild fluid in the chest wall at site of catheter insertion, without rim enhancing collection. Dependent atelectasis, greater on the right. Mild septal thickening at the apices but no overt pulmonary edema. 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 CT ABDOMEN and PELVIS FINDINGS Abdominal wall:  No contributory findings. Hepatobiliary: Cirrhotic morphology. Nutmeg pattern enhancement attributed to passive congestion.Cholecystectomy with negative common bile duct. Pancreas: Unremarkable. Spleen: Unremarkable. Adrenals/Urinary Tract: Negative adrenals. Marked renal atrophy with absent enhancement. Unremarkable bladder. Reproductive:Hysterectomy.  Negative adnexa. Stomach/Bowel:  No obstruction. No inflammatory changes. Vascular/Lymphatic: No acute vascular abnormality. IVC filter in place. Extensive atherosclerosis. High-grade left common iliac artery stenosis from bulky calcified plaque. No mass or adenopathy. Peritoneal: Small volume ascites/dialysate. Tenckhoff catheter in the right pelvis. 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 Review of the MIP images confirms the above findings. IMPRESSION: 1. No acute finding or change since prior. Negative for pulmonary embolism. 2. Moderate right pleural effusion with tunneled pleural catheter in place. 3. Cirrhosis. 4. Additional chronic findings are described above.  Electronically Signed   By: Marnee SpringJonathon  Watts M.D.   On: 04/08/2016 00:14     ASSESSMENT AND PLAN:   55 year old female with past medical history of end-stage renal disease on peritoneal dialysis, cirrhosis, anemia of chronic disease, carotid artery stenosis, GERD, history of rheumatic heart disease with mitral regurgitation, previous TIA, upper GI bleed who presented to the hospital due to abdominal pain.  1. Abdominal pain-etiology unclear. Patient's CT scan of the abdomen and pelvis shows no evidence of acute pathology. -Patient is clinically afebrile, hemodynamically stable with a normal white cell count. -She has no nausea or vomiting and is tolerating a diet well. -We will check peritoneal fluid to see if it's infected as she is on peritoneal dialysis, and also check pleural fluid for any infectious pathology.  2. End-stage renal disease on peritoneal dialysis-nephrology has been consulted. Continue care as per them. -Check peritoneal fluid to rule out peritonitis.  3. Secondary hyperparathyroidism-continue Sensipar, Phoslo.  4. GERD-continue Protonix.  5.Depression - cont. Celexa.   6. Hx of PVD - cont. Plavix, Statin.     All the records are reviewed and case discussed with Care Management/Social Workerr. Management plans discussed with the patient, family and they are in agreement.  CODE STATUS: Full Code  DVT Prophylaxis: TEd's & SCD's.   TOTAL TIME TAKING CARE OF THIS PATIENT: 30 minutes.   POSSIBLE D/C IN 1-2 DAYS, DEPENDING ON CLINICAL CONDITION.   Houston SirenSAINANI,Seini Lannom J M.D on 04/08/2016 at 2:22 PM  Between 7am to 6pm - Pager - [phone]  After 6pm go to [email or web address] - password [email or web address] [facility]  [facility]  Office  [phone]  CC: Primary care physician; Sherlene ShamsULLO, TERESA L, MD

## 2016-04-08 NOTE — Progress Notes (Signed)
Spoke with Dr. Cherlynn KaiserSainani about pt pleural draining cath. and pt pain management concerns. New orders received.  Pt resting in bed. Continue to assess.

## 2016-04-08 NOTE — Progress Notes (Signed)
Subjective:  Patient presented to the hospital for lower chest/abdominal pain.  Patient has stroke and has aphasa. She points towards her right abdomen when asked about pain.  Otherwise, she is able to eat.  No nausea or vomiting reported No fever or chills.  No shortness of breath Her husband states that her PD fluid has been clear at home She underwent CT of the abdomen with IV contrast.  No acute abnormalities were seen. It showed moderate right pleural effusion, cirrhosis, renal osteodystrophy and sclerotic bones, IVC filter, left common iliac artery stenosis,passive congestion of liver,  Objective:  Vital signs in last 24 hours:  Temp:  [97.9 F (36.6 C)-98.9 F (37.2 C)] 97.9 F (36.6 C) (04/25 1246) Pulse Rate:  [88-131] 94 (04/25 1246) Resp:  [16-32] 20 (04/25 1246) BP: (102-130)/(74-102) 116/74 mmHg (04/25 1246) SpO2:  [91 %-97 %] 96 % (04/25 1246) Weight:  [50.803 kg (112 lb)] 50.803 kg (112 lb) (04/25 0500)  Weight change:  Filed Weights   04/07/16 1557 04/08/16 0500  Weight: 52.617 kg (116 lb) 50.803 kg (112 lb)    Intake/Output:    Intake/Output Summary (Last 24 hours) at 04/08/16 1628 Last data filed at 04/08/16 1018  Gross per 24 hour  Intake    840 ml  Output      0 ml  Net    840 ml     Physical Exam: General: No acute distress, laying in the bed  HEENT Anicteric, moist oral mucous membranes  Neck supple  Pulm/lungs Mild basilar crackles bilaterally  CVS/Heart Irregular, tachycardic  Abdomen:  Right lower quadrant tenderness to deep palpation, PD catheter intact  Extremities: + dependent peripheral edema  Neurologic: Alert, follows commands, aphasic  Skin: No acute rashes  Access: PD catheter in place       Basic Metabolic Panel:   Recent Labs Lab 04/07/16 1612  NA 135  K 4.8  CL 98*  CO2 23  GLUCOSE 93  BUN 60*  CREATININE 8.91*  CALCIUM 8.7*     CBC:  Recent Labs Lab 04/07/16 1612  WBC 7.7  NEUTROABS 6.1  HGB 9.4*  HCT  28.7*  MCV 84.5  PLT 212      Microbiology:  No results found for this or any previous visit (from the past 720 hour(s)).  Coagulation Studies:  Recent Labs  04/07/16 1612  LABPROT 14.3  INR 1.09    Urinalysis: No results for input(s): COLORURINE, LABSPEC, PHURINE, GLUCOSEU, HGBUR, BILIRUBINUR, KETONESUR, PROTEINUR, UROBILINOGEN, NITRITE, LEUKOCYTESUR in the last 72 hours.  Invalid input(s): APPERANCEUR    Imaging: Dg Chest 2 View  04/07/2016  CLINICAL DATA:  Patient with altered mental status. History of congestive heart failure. Right chest tube in place for 3 weeks. EXAM: CHEST  2 VIEW COMPARISON:  Chest radiograph 03/13/2016. FINDINGS: Monitoring leads overlie the patient. Stable cardiomegaly. Small bore right chest tube remains in position. Interval development of a small right pleural effusion. Heterogeneous opacities right lung base. No pneumothorax. Thoracic spine degenerative changes. IMPRESSION: Interval development of a small right pleural effusion with underlying opacities favored to represent atelectasis. Right chest tube remains in place. Electronically Signed   By: Annia Belt M.D.   On: 04/07/2016 16:41   Ct Angio Chest Pe W/cm &/or Wo Cm  04/08/2016  CLINICAL DATA:  Right-sided abdominal pain. Right-sided chest pain at site of tunneled pleural catheter. EXAM: CT ANGIOGRAPHY CHEST CT ABDOMEN AND PELVIS WITH CONTRAST TECHNIQUE: Multidetector CT imaging of the chest was performed using  the standard protocol during bolus administration of intravenous contrast. Multiplanar CT image reconstructions and MIPs were obtained to evaluate the vascular anatomy. Multidetector CT imaging of the abdomen and pelvis was performed using the standard protocol during bolus administration of intravenous contrast. CONTRAST:  75 cc Isovue 370 intravenous COMPARISON:  Abdominal CT 02/08/2016 FINDINGS: CTA CHEST FINDINGS THORACIC INLET/BODY WALL: No acute finding. MEDIASTINUM: Cardiomegaly with  left atrial and right heart dilatation. Mitral valve calcification which is bulky and may be caseous. Extensive atherosclerosis. Pulmonary hypertension with dilated pulmonary arterial tree. Intermittent motion degradation. No suspected acute pulmonary embolism. No acute aortic finding. LUNG WINDOWS: Moderate right pleural effusion with some posterior and fissural loculation but no pleural thickening or nodularity. A tunneled pleural catheter is in unremarkable position, contiguous with pleural fluid. Mild fluid in the chest wall at site of catheter insertion, without rim enhancing collection. Dependent atelectasis, greater on the right. Mild septal thickening at the apices but no overt pulmonary edema. 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 CT ABDOMEN and PELVIS FINDINGS Abdominal wall:  No contributory findings. Hepatobiliary: Cirrhotic morphology. Nutmeg pattern enhancement attributed to passive congestion.Cholecystectomy with negative common bile duct. Pancreas: Unremarkable. Spleen: Unremarkable. Adrenals/Urinary Tract: Negative adrenals. Marked renal atrophy with absent enhancement. Unremarkable bladder. Reproductive:Hysterectomy.  Negative adnexa. Stomach/Bowel:  No obstruction. No inflammatory changes. Vascular/Lymphatic: No acute vascular abnormality. IVC filter in place. Extensive atherosclerosis. High-grade left common iliac artery stenosis from bulky calcified plaque. No mass or adenopathy. Peritoneal: Small volume ascites/dialysate. Tenckhoff catheter in the right pelvis. 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 Review of the MIP images confirms the above findings. IMPRESSION: 1. No acute finding or change since prior. Negative for pulmonary embolism. 2. Moderate right pleural effusion with tunneled pleural catheter in place. 3. Cirrhosis. 4. Additional chronic findings are described above. Electronically Signed   By: Marnee SpringJonathon  Watts M.D.   On: 04/08/2016  00:14   Ct Abdomen Pelvis W Contrast  04/08/2016  CLINICAL DATA:  Right-sided abdominal pain. Right-sided chest pain at site of tunneled pleural catheter. EXAM: CT ANGIOGRAPHY CHEST CT ABDOMEN AND PELVIS WITH CONTRAST TECHNIQUE: Multidetector CT imaging of the chest was performed using the standard protocol during bolus administration of intravenous contrast. Multiplanar CT image reconstructions and MIPs were obtained to evaluate the vascular anatomy. Multidetector CT imaging of the abdomen and pelvis was performed using the standard protocol during bolus administration of intravenous contrast. CONTRAST:  75 cc Isovue 370 intravenous COMPARISON:  Abdominal CT 02/08/2016 FINDINGS: CTA CHEST FINDINGS THORACIC INLET/BODY WALL: No acute finding. MEDIASTINUM: Cardiomegaly with left atrial and right heart dilatation. Mitral valve calcification which is bulky and may be caseous. Extensive atherosclerosis. Pulmonary hypertension with dilated pulmonary arterial tree. Intermittent motion degradation. No suspected acute pulmonary embolism. No acute aortic finding. LUNG WINDOWS: Moderate right pleural effusion with some posterior and fissural loculation but no pleural thickening or nodularity. A tunneled pleural catheter is in unremarkable position, contiguous with pleural fluid. Mild fluid in the chest wall at site of catheter insertion, without rim enhancing collection. Dependent atelectasis, greater on the right. Mild septal thickening at the apices but no overt pulmonary edema. 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 CT ABDOMEN and PELVIS FINDINGS Abdominal wall:  No contributory findings. Hepatobiliary: Cirrhotic morphology. Nutmeg pattern enhancement attributed to passive congestion.Cholecystectomy with negative common bile duct. Pancreas: Unremarkable. Spleen: Unremarkable. Adrenals/Urinary Tract: Negative adrenals. Marked renal atrophy with absent enhancement. Unremarkable bladder.  Reproductive:Hysterectomy.  Negative adnexa. Stomach/Bowel:  No obstruction. No inflammatory changes. Vascular/Lymphatic: No  acute vascular abnormality. IVC filter in place. Extensive atherosclerosis. High-grade left common iliac artery stenosis from bulky calcified plaque. No mass or adenopathy. Peritoneal: Small volume ascites/dialysate. Tenckhoff catheter in the right pelvis. 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 Review of the MIP images confirms the above findings. IMPRESSION: 1. No acute finding or change since prior. Negative for pulmonary embolism. 2. Moderate right pleural effusion with tunneled pleural catheter in place. 3. Cirrhosis. 4. Additional chronic findings are described above. Electronically Signed   By: Marnee Spring M.D.   On: 04/08/2016 00:14     Medications:     . aspirin  81 mg Oral Daily  . atorvastatin  80 mg Oral q1800  . calcitRIOL  0.5 mcg Oral Daily  . calcium acetate  2,001 mg Oral TID WC  . cinacalcet  30 mg Oral Q breakfast  . citalopram  20 mg Oral Daily  . clopidogrel  75 mg Oral Daily  . docusate sodium  100 mg Oral BID  . gentamicin cream  1 application Topical Daily  . magnesium oxide  400 mg Oral Daily  . pantoprazole  40 mg Oral BID AC  . sodium chloride  2 g Oral BID WC  . sucralfate  1 g Oral TID WC & HS  . tiotropium  18 mcg Inhalation Daily   acetaminophen **OR** acetaminophen, albuterol, docusate sodium, heparin, morphine injection, ondansetron **OR** ondansetron (ZOFRAN) IV, ondansetron, oxyCODONE  Assessment/ Plan:  55 y.o.African American female  with history of end-stage renal disease (FSGS) currently on PD, paroxysmal atrial fibrillation, COPD, cirrhosis, stroke with right-sided weakness 2017,severe mitral stenosis,depression, thrombocytopenia, and negative anticardiolipin antibody, weakly positive ANA, acute GI bleed in January 2017 caused by severe esophagitis and bleeding rectal ulcer, IVC filter, pulmonary  arterial hypertension, right-sided heart dysfunction consistent with cor pulmonale, significant venous engorgement of the suprahepatic IVC and hepatic veins suspicious for congestive hepatopathy and cardiac cirrhosis.  1. End-stage renal disease on peritoneal dialysis. Continue peritoneal dialysis with 8 hours, 6 exchanges, fill volume 1600 mL, - 2.5% solutions used this morning - May need to use 4.25 tonight for volume removal  2. Right pleural effusion. Likely related to high right-sided pressures and pulmonary hypertension given her mitral valve disease. She will need valve replacement per cardiology. - Currently managed with periodic removal of fluid from tunneled pleurex catheter  3. Secondary hyperparathyroidism.  Monitor phos - Continue calcium acetate, calcitriol, and Sensipar. Taking 180 mg at home  4. Anemia chronic kidney disease. Hemoglobin 9.4 -  EPO being held due to recent stroke  5. Mitral valve disease.  - Pleural effusions managed with plurex catheter drainage every other day   6. Abdominal pain -  will get PD fluid checked to r/o peritonitis ? Passive congestion in liver causing pain - will use 4.25% solutions for volume removal    LOS:  Dorothy Long 4/25/20174:28 PM

## 2016-04-08 NOTE — Clinical Social Work Note (Signed)
Nursing consulted CSW in error. Rn CM will follow for home health. CSW has notified RN CM. York SpanielMonica Ilma Achee MSW,LCSW [phone]

## 2016-04-08 NOTE — Progress Notes (Signed)
Chest tube drained with pleurex cath. Pt tolerated well. 275 mL removed. Specimen sent to lab. New dressing applied. Pt resting in bed continue to assess.

## 2016-04-09 LAB — LACTATE DEHYDROGENASE, PLEURAL OR PERITONEAL FLUID: LD, Fluid: <5 U/L (ref 3–23)

## 2016-04-09 LAB — GLUCOSE, SEROUS FLUID: Glucose, Fluid: 1335 mg/dL

## 2016-04-09 LAB — PATHOLOGIST SMEAR REVIEW

## 2016-04-09 MED ORDER — OXYCODONE HCL 5 MG PO TABS
5.0000 mg | ORAL_TABLET | ORAL | Status: DC | PRN
  Administered 2016-04-10: 10 mg via ORAL
  Administered 2016-04-10: 5 mg via ORAL
  Filled 2016-04-09: qty 2
  Filled 2016-04-09: qty 1

## 2016-04-09 MED ORDER — MORPHINE SULFATE (PF) 2 MG/ML IV SOLN: 2.0000 mg | Freq: Four times a day (QID) | INTRAVENOUS | Status: DC | PRN

## 2016-04-09 NOTE — Progress Notes (Signed)
[employer] [facility] - [place] at [facility]   PATIENT NAME: Dorothy Long    MR#:  [id number]  DATE OF BIRTH:  12/08/1961  SUBJECTIVE:   She was here due to worsening abdominal pain. Patient is still complaining of abdominal pain on the right side but no nausea, vomiting. No fever. Asking for morphine every 3-4 hours. Patient's peritoneal and pleural fluid analysis are negative for any acute pathology.  REVIEW OF SYSTEMS:    Review of Systems  Constitutional: Negative for fever and chills.  HENT: Negative for congestion and tinnitus.   Eyes: Negative for blurred vision and double vision.  Respiratory: Negative for cough, shortness of breath and wheezing.   Cardiovascular: Negative for chest pain, orthopnea and PND.  Gastrointestinal: Positive for abdominal pain. Negative for nausea, vomiting and diarrhea.  Genitourinary: Negative for dysuria and hematuria.  Neurological: Negative for dizziness, sensory change and focal weakness.  All other systems reviewed and are negative.   Nutrition: Heart healthy Tolerating Diet: Yes Tolerating PT: Await evaluation   DRUG ALLERGIES:   Allergies  Allergen Reactions  . Buprenorphine Nausea And Vomiting  . Ciprocinonide [Fluocinolone] Hives  . Demerol [Meperidine] Nausea And Vomiting  . Diflucan [Fluconazole] Nausea And Vomiting  . Flagyl [Metronidazole] Hives and Itching  . Hydrocodone Itching  . Levaquin [Levofloxacin] Other (See Comments)    Reaction: Numbness in legs   . Tetracyclines & Related Itching and Swelling  . Valium [Diazepam] Nausea And Vomiting and Other (See Comments)    Reaction:  Hallucinations     VITALS:  Blood pressure 130/92, pulse 113, temperature 98 F (36.7 C), temperature source Oral, resp. rate 15, height  (1.778 m), weight 53.116 kg (117 lb 1.6 oz), SpO2 96 %.  PHYSICAL EXAMINATION:   Physical Exam  GENERAL:  55 y.o.-year-old patient lying in the bed lethargic but in no apparent  distress.  EYES: Pupils equal, round, reactive to light and accommodation. No scleral icterus. Extraocular muscles intact.  HEENT: Head atraumatic, normocephalic. Oropharynx and nasopharynx clear.  NECK:  Supple, no jugular venous distention. No thyroid enlargement, no tenderness.  LUNGS: Normal breath sounds bilaterally, no wheezing, rales, rhonchi. No use of accessory muscles of respiration. Positive right-sided Pleurx catheter in place. CARDIOVASCULAR: S1, S2 normal. 2/6 systolic ejection murmur at the left sternal border, no rubs, or gallops.  ABDOMEN: Soft, tender in the right lower quadrant, no rebound, rigidity. nondistended. Bowel sounds present. No organomegaly or mass. Positive peritoneal dialysis catheter in place without any drainage. EXTREMITIES: No cyanosis, clubbing or edema b/l.    NEUROLOGIC: Cranial nerves II through XII are intact. No focal Motor or sensory deficits b/l. Globally weak  PSYCHIATRIC: The patient is alert and oriented x 3.  SKIN: No obvious rash, lesion, or ulcer.    LABORATORY PANEL:   CBC  Recent Labs Lab 04/07/16 1612  WBC 7.7  HGB 9.4*  HCT 28.7*  PLT 212   ------------------------------------------------------------------------------------------------------------------  Chemistries   Recent Labs Lab 04/07/16 1612  NA 135  K 4.8  CL 98*  CO2 23  GLUCOSE 93  BUN 60*  CREATININE 8.91*  CALCIUM 8.7*  AST 77*  ALT 39  ALKPHOS 86  BILITOT 1.1   ------------------------------------------------------------------------------------------------------------------  Cardiac Enzymes  Recent Labs Lab 04/07/16 1612  TROPONINI 0.08*   ------------------------------------------------------------------------------------------------------------------  RADIOLOGY:  Dg Chest 2 View  04/07/2016  CLINICAL DATA:  Patient with altered mental status. History of congestive heart failure. Right chest tube in place for 3 weeks. EXAM: CHEST  2 VIEW  COMPARISON:  Chest radiograph 03/13/2016. FINDINGS: Monitoring leads overlie the patient. Stable cardiomegaly. Small bore right chest tube remains in position. Interval development of a small right pleural effusion. Heterogeneous opacities right lung base. No pneumothorax. Thoracic spine degenerative changes. IMPRESSION: Interval development of a small right pleural effusion with underlying opacities favored to represent atelectasis. Right chest tube remains in place. Electronically Signed   By: Annia Belt M.D.   On: 04/07/2016 16:41   Ct Angio Chest Pe W/cm &/or Wo Cm  04/08/2016  CLINICAL DATA:  Right-sided abdominal pain. Right-sided chest pain at site of tunneled pleural catheter. EXAM: CT ANGIOGRAPHY CHEST CT ABDOMEN AND PELVIS WITH CONTRAST TECHNIQUE: Multidetector CT imaging of the chest was performed using the standard protocol during bolus administration of intravenous contrast. Multiplanar CT image reconstructions and MIPs were obtained to evaluate the vascular anatomy. Multidetector CT imaging of the abdomen and pelvis was performed using the standard protocol during bolus administration of intravenous contrast. CONTRAST:  75 cc Isovue 370 intravenous COMPARISON:  Abdominal CT 02/08/2016 FINDINGS: CTA CHEST FINDINGS THORACIC INLET/BODY WALL: No acute finding. MEDIASTINUM: Cardiomegaly with left atrial and right heart dilatation. Mitral valve calcification which is bulky and may be caseous. Extensive atherosclerosis. Pulmonary hypertension with dilated pulmonary arterial tree. Intermittent motion degradation. No suspected acute pulmonary embolism. No acute aortic finding. LUNG WINDOWS: Moderate right pleural effusion with some posterior and fissural loculation but no pleural thickening or nodularity. A tunneled pleural catheter is in unremarkable position, contiguous with pleural fluid. Mild fluid in the chest wall at site of catheter insertion, without rim enhancing collection. Dependent atelectasis,  greater on the right. Mild septal thickening at the apices but no overt pulmonary edema. 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 CT ABDOMEN and PELVIS FINDINGS Abdominal wall:  No contributory findings. Hepatobiliary: Cirrhotic morphology. Nutmeg pattern enhancement attributed to passive congestion.Cholecystectomy with negative common bile duct. Pancreas: Unremarkable. Spleen: Unremarkable. Adrenals/Urinary Tract: Negative adrenals. Marked renal atrophy with absent enhancement. Unremarkable bladder. Reproductive:Hysterectomy.  Negative adnexa. Stomach/Bowel:  No obstruction. No inflammatory changes. Vascular/Lymphatic: No acute vascular abnormality. IVC filter in place. Extensive atherosclerosis. High-grade left common iliac artery stenosis from bulky calcified plaque. No mass or adenopathy. Peritoneal: Small volume ascites/dialysate. Tenckhoff catheter in the right pelvis. 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 Review of the MIP images confirms the above findings. IMPRESSION: 1. No acute finding or change since prior. Negative for pulmonary embolism. 2. Moderate right pleural effusion with tunneled pleural catheter in place. 3. Cirrhosis. 4. Additional chronic findings are described above. Electronically Signed   By: Marnee Spring M.D.   On: 04/08/2016 00:14   Ct Abdomen Pelvis W Contrast  04/08/2016  CLINICAL DATA:  Right-sided abdominal pain. Right-sided chest pain at site of tunneled pleural catheter. EXAM: CT ANGIOGRAPHY CHEST CT ABDOMEN AND PELVIS WITH CONTRAST TECHNIQUE: Multidetector CT imaging of the chest was performed using the standard protocol during bolus administration of intravenous contrast. Multiplanar CT image reconstructions and MIPs were obtained to evaluate the vascular anatomy. Multidetector CT imaging of the abdomen and pelvis was performed using the standard protocol during bolus administration of intravenous contrast. CONTRAST:  75 cc  Isovue 370 intravenous COMPARISON:  Abdominal CT 02/08/2016 FINDINGS: CTA CHEST FINDINGS THORACIC INLET/BODY WALL: No acute finding. MEDIASTINUM: Cardiomegaly with left atrial and right heart dilatation. Mitral valve calcification which is bulky and may be caseous. Extensive atherosclerosis. Pulmonary hypertension with dilated pulmonary arterial tree. Intermittent motion degradation. No suspected acute pulmonary embolism. No acute  aortic finding. LUNG WINDOWS: Moderate right pleural effusion with some posterior and fissural loculation but no pleural thickening or nodularity. A tunneled pleural catheter is in unremarkable position, contiguous with pleural fluid. Mild fluid in the chest wall at site of catheter insertion, without rim enhancing collection. Dependent atelectasis, greater on the right. Mild septal thickening at the apices but no overt pulmonary edema. 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 CT ABDOMEN and PELVIS FINDINGS Abdominal wall:  No contributory findings. Hepatobiliary: Cirrhotic morphology. Nutmeg pattern enhancement attributed to passive congestion.Cholecystectomy with negative common bile duct. Pancreas: Unremarkable. Spleen: Unremarkable. Adrenals/Urinary Tract: Negative adrenals. Marked renal atrophy with absent enhancement. Unremarkable bladder. Reproductive:Hysterectomy.  Negative adnexa. Stomach/Bowel:  No obstruction. No inflammatory changes. Vascular/Lymphatic: No acute vascular abnormality. IVC filter in place. Extensive atherosclerosis. High-grade left common iliac artery stenosis from bulky calcified plaque. No mass or adenopathy. Peritoneal: Small volume ascites/dialysate. Tenckhoff catheter in the right pelvis. 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 Review of the MIP images confirms the above findings. IMPRESSION: 1. No acute finding or change since prior. Negative for pulmonary embolism. 2. Moderate right pleural effusion with  tunneled pleural catheter in place. 3. Cirrhosis. 4. Additional chronic findings are described above. Electronically Signed   By: Marnee SpringJonathon  Watts M.D.   On: 04/08/2016 00:14     ASSESSMENT AND PLAN:   55 year old female with past medical history of end-stage renal disease on peritoneal dialysis, cirrhosis, anemia of chronic disease, carotid artery stenosis, GERD, history of rheumatic heart disease with mitral regurgitation, previous TIA, upper GI bleed who presented to the hospital due to abdominal pain.  1. Abdominal pain-etiology unclear. Patient's CT scan of the abdomen and pelvis shows no evidence of acute pathology. -Patient is clinically afebrile, hemodynamically stable with a normal white cell count. -She has no nausea or vomiting and is tolerating a diet well. -Peritoneal fluid analysis and pleural fluid analysis is negative for any evidence of infection. -As per nephrology questionable if the patient's pain is related to passive congestion from her cirrhosis and volume overload. They will try to remove extra fluid using PD to see if it improves her symptoms. -I will change her IV morphine and placed her on some oral oxycodone as needed.  2. End-stage renal disease on peritoneal dialysis-nephrology has been consulted. Continue care as per them. -Peritoneal fluid analysis negative.  3. Secondary hyperparathyroidism-continue Sensipar, Phoslo.  4. GERD-continue Protonix.  5.Depression - cont. Celexa.   6. Hx of PVD - cont. Plavix, Statin.    Possible DC home tomorrow  All the records are reviewed and case discussed with Care Management/Social Workerr. Management plans discussed with the patient, family and they are in agreement.  CODE STATUS: Full Code  DVT Prophylaxis: TEd's & SCD's.   TOTAL TIME TAKING CARE OF THIS PATIENT: 30 minutes.   POSSIBLE D/C IN 1-2 DAYS, DEPENDING ON CLINICAL CONDITION.   [place] SirenSAINANI,VIVEK J M.D on 04/09/2016 at 2:47 PM  Between 7am to 6pm - Pager  - [phone]  After 6pm go to [email or web address] - password [email or web address] [facility]  [facility]  Office  [phone]  CC: Primary care physician; Sherlene ShamsULLO, TERESA L, MD

## 2016-04-09 NOTE — Progress Notes (Signed)
Subjective:  Patient presented to the hospital for lower chest/abdominal pain.  Patient has stroke and has aphasia. She continues to report pain and points towards her right abdomen when asked about pain.  Otherwise, she is able to eat.  No nausea or vomiting reported No fever or chills.  No shortness of breath Her husband states that her PD fluid has been clear at home She underwent CT of the abdomen with IV contrast.  No acute abnormalities were seen. It showed moderate right pleural effusion, cirrhosis, renal osteodystrophy and sclerotic bones, IVC filter, left common iliac artery stenosis,passive congestion of liver,  UF with PD 1100 overnight and 1000 cc yesterday during the day  Objective:  Vital signs in last 24 hours:  Temp:  [97.9 F (36.6 C)-99.8 F (37.7 C)] 99.8 F (37.7 C) (04/26 0504) Pulse Rate:  [94-101] 101 (04/26 0504) Resp:  [16-20] 18 (04/26 0504) BP: (100-116)/(74-85) 100/74 mmHg (04/26 0504) SpO2:  [92 %-97 %] 92 % (04/26 0504) Weight:  [53.116 kg (117 lb 1.6 oz)] 53.116 kg (117 lb 1.6 oz) (04/26 0500)  Weight change: 0.499 kg (1 lb 1.6 oz) Filed Weights   04/07/16 1557 04/08/16 0500 04/09/16 0500  Weight: 52.617 kg (116 lb) 50.803 kg (112 lb) 53.116 kg (117 lb 1.6 oz)    Intake/Output:    Intake/Output Summary (Last 24 hours) at 04/09/16 1235 Last data filed at 04/09/16 0900  Gross per 24 hour  Intake    255 ml  Output   2201 ml  Net  -1946 ml     Physical Exam: General: No acute distress, laying in the bed  HEENT Anicteric, moist oral mucous membranes  Neck supple  Pulm/lungs Clear to auscultation bilaterally  CVS/Heart Irregular, tachycardic  Abdomen:  Right lower quadrant tenderness to deep palpation, PD catheter intact  Extremities: + dependent peripheral edema  Neurologic: Alert, follows commands, aphasic  Skin: No acute rashes  Access: PD catheter in place       Basic Metabolic Panel:   Recent Labs Lab 04/07/16 1612  NA 135  K  4.8  CL 98*  CO2 23  GLUCOSE 93  BUN 60*  CREATININE 8.91*  CALCIUM 8.7*     CBC:  Recent Labs Lab 04/07/16 1612  WBC 7.7  NEUTROABS 6.1  HGB 9.4*  HCT 28.7*  MCV 84.5  PLT 212      Microbiology:  No results found for this or any previous visit (from the past 720 hour(s)).  Coagulation Studies:  Recent Labs  04/07/16 1612  LABPROT 14.3  INR 1.09    Urinalysis:  Recent Labs  04/07/16 1943  COLORURINE YELLOW*  LABSPEC 1.016  PHURINE 8.0  GLUCOSEU 150*  HGBUR NEGATIVE  BILIRUBINUR NEGATIVE  KETONESUR NEGATIVE  PROTEINUR >500*  NITRITE NEGATIVE  LEUKOCYTESUR 2+*      Imaging: Dg Chest 2 View  04/07/2016  CLINICAL DATA:  Patient with altered mental status. History of congestive heart failure. Right chest tube in place for 3 weeks. EXAM: CHEST  2 VIEW COMPARISON:  Chest radiograph 03/13/2016. FINDINGS: Monitoring leads overlie the patient. Stable cardiomegaly. Small bore right chest tube remains in position. Interval development of a small right pleural effusion. Heterogeneous opacities right lung base. No pneumothorax. Thoracic spine degenerative changes. IMPRESSION: Interval development of a small right pleural effusion with underlying opacities favored to represent atelectasis. Right chest tube remains in place. Electronically Signed   By: Annia Beltrew  Davis M.D.   On: 04/07/2016 16:41   Ct Angio  Chest Pe W/cm &/or Wo Cm  04/08/2016  CLINICAL DATA:  Right-sided abdominal pain. Right-sided chest pain at site of tunneled pleural catheter. EXAM: CT ANGIOGRAPHY CHEST CT ABDOMEN AND PELVIS WITH CONTRAST TECHNIQUE: Multidetector CT imaging of the chest was performed using the standard protocol during bolus administration of intravenous contrast. Multiplanar CT image reconstructions and MIPs were obtained to evaluate the vascular anatomy. Multidetector CT imaging of the abdomen and pelvis was performed using the standard protocol during bolus administration of intravenous  contrast. CONTRAST:  75 cc Isovue 370 intravenous COMPARISON:  Abdominal CT 02/08/2016 FINDINGS: CTA CHEST FINDINGS THORACIC INLET/BODY WALL: No acute finding. MEDIASTINUM: Cardiomegaly with left atrial and right heart dilatation. Mitral valve calcification which is bulky and may be caseous. Extensive atherosclerosis. Pulmonary hypertension with dilated pulmonary arterial tree. Intermittent motion degradation. No suspected acute pulmonary embolism. No acute aortic finding. LUNG WINDOWS: Moderate right pleural effusion with some posterior and fissural loculation but no pleural thickening or nodularity. A tunneled pleural catheter is in unremarkable position, contiguous with pleural fluid. Mild fluid in the chest wall at site of catheter insertion, without rim enhancing collection. Dependent atelectasis, greater on the right. Mild septal thickening at the apices but no overt pulmonary edema. 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 CT ABDOMEN and PELVIS FINDINGS Abdominal wall:  No contributory findings. Hepatobiliary: Cirrhotic morphology. Nutmeg pattern enhancement attributed to passive congestion.Cholecystectomy with negative common bile duct. Pancreas: Unremarkable. Spleen: Unremarkable. Adrenals/Urinary Tract: Negative adrenals. Marked renal atrophy with absent enhancement. Unremarkable bladder. Reproductive:Hysterectomy.  Negative adnexa. Stomach/Bowel:  No obstruction. No inflammatory changes. Vascular/Lymphatic: No acute vascular abnormality. IVC filter in place. Extensive atherosclerosis. High-grade left common iliac artery stenosis from bulky calcified plaque. No mass or adenopathy. Peritoneal: Small volume ascites/dialysate. Tenckhoff catheter in the right pelvis. 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 Review of the MIP images confirms the above findings. IMPRESSION: 1. No acute finding or change since prior. Negative for pulmonary embolism. 2. Moderate  right pleural effusion with tunneled pleural catheter in place. 3. Cirrhosis. 4. Additional chronic findings are described above. Electronically Signed   By: Marnee Spring M.D.   On: 04/08/2016 00:14   Ct Abdomen Pelvis W Contrast  04/08/2016  CLINICAL DATA:  Right-sided abdominal pain. Right-sided chest pain at site of tunneled pleural catheter. EXAM: CT ANGIOGRAPHY CHEST CT ABDOMEN AND PELVIS WITH CONTRAST TECHNIQUE: Multidetector CT imaging of the chest was performed using the standard protocol during bolus administration of intravenous contrast. Multiplanar CT image reconstructions and MIPs were obtained to evaluate the vascular anatomy. Multidetector CT imaging of the abdomen and pelvis was performed using the standard protocol during bolus administration of intravenous contrast. CONTRAST:  75 cc Isovue 370 intravenous COMPARISON:  Abdominal CT 02/08/2016 FINDINGS: CTA CHEST FINDINGS THORACIC INLET/BODY WALL: No acute finding. MEDIASTINUM: Cardiomegaly with left atrial and right heart dilatation. Mitral valve calcification which is bulky and may be caseous. Extensive atherosclerosis. Pulmonary hypertension with dilated pulmonary arterial tree. Intermittent motion degradation. No suspected acute pulmonary embolism. No acute aortic finding. LUNG WINDOWS: Moderate right pleural effusion with some posterior and fissural loculation but no pleural thickening or nodularity. A tunneled pleural catheter is in unremarkable position, contiguous with pleural fluid. Mild fluid in the chest wall at site of catheter insertion, without rim enhancing collection. Dependent atelectasis, greater on the right. Mild septal thickening at the apices but no overt pulmonary edema. 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 CT ABDOMEN and PELVIS FINDINGS Abdominal wall:  No contributory findings. Hepatobiliary:  Cirrhotic morphology. Nutmeg pattern enhancement attributed to passive congestion.Cholecystectomy with  negative common bile duct. Pancreas: Unremarkable. Spleen: Unremarkable. Adrenals/Urinary Tract: Negative adrenals. Marked renal atrophy with absent enhancement. Unremarkable bladder. Reproductive:Hysterectomy.  Negative adnexa. Stomach/Bowel:  No obstruction. No inflammatory changes. Vascular/Lymphatic: No acute vascular abnormality. IVC filter in place. Extensive atherosclerosis. High-grade left common iliac artery stenosis from bulky calcified plaque. No mass or adenopathy. Peritoneal: Small volume ascites/dialysate. Tenckhoff catheter in the right pelvis. 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 Review of the MIP images confirms the above findings. IMPRESSION: 1. No acute finding or change since prior. Negative for pulmonary embolism. 2. Moderate right pleural effusion with tunneled pleural catheter in place. 3. Cirrhosis. 4. Additional chronic findings are described above. Electronically Signed   By: Marnee Spring M.D.   On: 04/08/2016 00:14     Medications:     . aspirin  81 mg Oral Daily  . atorvastatin  80 mg Oral q1800  . calcitRIOL  0.5 mcg Oral Daily  . calcium acetate  2,001 mg Oral TID WC  . cinacalcet  180 mg Oral Q supper  . citalopram  20 mg Oral Daily  . clopidogrel  75 mg Oral Daily  . dialysis solution 2.5% low-MG/low-CA   Intraperitoneal Q24H  . dialysis solution 4.25% low-MG/low-CA   Intraperitoneal Q24H  . docusate sodium  100 mg Oral BID  . gentamicin cream  1 application Topical Daily  . magnesium oxide  400 mg Oral Daily  . pantoprazole  40 mg Oral BID AC  . sodium chloride  2 g Oral BID WC  . sucralfate  1 g Oral TID WC & HS  . tiotropium  18 mcg Inhalation Daily   acetaminophen **OR** acetaminophen, albuterol, docusate sodium, heparin, morphine injection, ondansetron **OR** ondansetron (ZOFRAN) IV, ondansetron, oxyCODONE  Assessment/ Plan:  55 y.o.African American female  with history of end-stage renal disease (FSGS) currently on  PD, paroxysmal atrial fibrillation, COPD, cirrhosis, stroke with right-sided weakness 2017,severe mitral stenosis,depression, thrombocytopenia, and negative anticardiolipin antibody, weakly positive ANA, acute GI bleed in January 2017 caused by severe esophagitis and bleeding rectal ulcer, IVC filter, pulmonary arterial hypertension, right-sided heart dysfunction consistent with cor pulmonale, significant venous engorgement of the suprahepatic IVC and hepatic veins suspicious for congestive hepatopathy and cardiac cirrhosis.  1. End-stage renal disease on peritoneal dialysis. Continue peritoneal dialysis with 8 hours, 6 exchanges, fill volume 1600 mL, -  Use 4.25 and 2.5 % tonight for volume removal  2. Right pleural effusion. Likely related to high right-sided pressures and pulmonary hypertension given her mitral valve disease. She will need valve replacement per cardiology. - Currently managed with periodic removal of fluid from tunneled pleurex catheter  3. Secondary hyperparathyroidism.  Monitor phos - Continue calcium acetate, calcitriol, and Sensipar. Taking 180 mg at home  4. Anemia chronic kidney disease. Hemoglobin 9.4 -  EPO being held due to recent stroke  5. Mitral valve disease.  - Pleural effusions managed with plurex catheter drainage every other day   6. Abdominal pain -  will get PD fluid checked to r/o peritonitis ? Passive congestion in liver causing pain - will use 4.25% solutions for volume removal    LOS:  Dorothy Long 4/26/201712:35 PM

## 2016-04-09 NOTE — Care Management (Signed)
Attempted assessment.  Patient asked that I return at a later time

## 2016-04-09 NOTE — Progress Notes (Addendum)
Patient A/O, noted confusion and sometimes it difficult to get her to follow directions. Difficult for patient to find words to express need.  Patient tolerated meds well. Patient slept well. RLE edema +3. Patient c/o pain administered prn pain regimen, effective. Staff will continue to monitor and meet needs.

## 2016-04-10 LAB — BODY FLUID CELL COUNT WITH DIFFERENTIAL
EOS FL: 0 %
Lymphs, Fluid: 78 %
Monocyte-Macrophage-Serous Fluid: 17 %
Neutrophil Count, Fluid: 5 %
Total Nucleated Cell Count, Fluid: 32 cu mm

## 2016-04-10 LAB — BASIC METABOLIC PANEL
ANION GAP: 18 — AB (ref 5–15)
BUN: 58 mg/dL — ABNORMAL HIGH (ref 6–20)
CO2: 20 mmol/L — ABNORMAL LOW (ref 22–32)
Calcium: 7.8 mg/dL — ABNORMAL LOW (ref 8.9–10.3)
Chloride: 93 mmol/L — ABNORMAL LOW (ref 101–111)
Creatinine, Ser: 9.19 mg/dL — ABNORMAL HIGH (ref 0.44–1.00)
GFR calc Af Amer: 5 mL/min — ABNORMAL LOW (ref >60)
GFR, EST NON AFRICAN AMERICAN: 4 mL/min — AB (ref >60)
Glucose, Bld: 124 mg/dL — ABNORMAL HIGH (ref 65–99)
POTASSIUM: 5.3 mmol/L — AB (ref 3.5–5.1)
SODIUM: 131 mmol/L — AB (ref 135–145)

## 2016-04-10 LAB — MISC LABCORP TEST (SEND OUT): Labcorp test code: 19588

## 2016-04-10 MED ORDER — FLEET ENEMA 7-19 GM/118ML RE ENEM
1.0000 | ENEMA | Freq: Once | RECTAL | Status: AC
  Administered 2016-04-10: 1 via RECTAL

## 2016-04-10 MED ORDER — BISACODYL 10 MG RE SUPP
10.0000 mg | Freq: Once | RECTAL | Status: AC
  Administered 2016-04-10: 10 mg via RECTAL
  Filled 2016-04-10: qty 1

## 2016-04-10 NOTE — Progress Notes (Signed)
Pre-peritoneal dialysis 

## 2016-04-10 NOTE — Progress Notes (Signed)
[employer] - [place] at [facility]   PATIENT NAME: Dorothy Long    MR#:  [id number]  DATE OF BIRTH:  1961-09-23  SUBJECTIVE:   Still having some abdominal pain, also having some dry heaves and nausea this morning. No fever, has pain, shortness of breath.  REVIEW OF SYSTEMS:    Review of Systems  Constitutional: Negative for fever and chills.  HENT: Negative for congestion and tinnitus.   Eyes: Negative for blurred vision and double vision.  Respiratory: Negative for cough, shortness of breath and wheezing.   Cardiovascular: Negative for chest pain, orthopnea and PND.  Gastrointestinal: Positive for nausea and abdominal pain. Negative for vomiting and diarrhea.  Genitourinary: Negative for dysuria and hematuria.  Neurological: Negative for dizziness, sensory change and focal weakness.  All other systems reviewed and are negative.   Nutrition: Heart healthy Tolerating Diet: Yes Tolerating PT: Await evaluation   DRUG ALLERGIES:   Allergies  Allergen Reactions  . Buprenorphine Nausea And Vomiting  . Ciprocinonide [Fluocinolone] Hives  . Demerol [Meperidine] Nausea And Vomiting  . Diflucan [Fluconazole] Nausea And Vomiting  . Flagyl [Metronidazole] Hives and Itching  . Hydrocodone Itching  . Levaquin [Levofloxacin] Other (See Comments)    Reaction: Numbness in legs   . Tetracyclines & Related Itching and Swelling  . Valium [Diazepam] Nausea And Vomiting and Other (See Comments)    Reaction:  Hallucinations     VITALS:  Blood pressure 113/84, pulse 113, temperature 98.6 F (37 C), temperature source Oral, resp. rate 18, height  (1.778 m), weight 48.807 kg (107 lb 9.6 oz), SpO2 96 %.  PHYSICAL EXAMINATION:   Physical Exam  GENERAL:  55 y.o.-year-old patient lying in the bed in no apparent distress.  EYES: Pupils equal, round, reactive to light and accommodation. No scleral icterus. Extraocular muscles intact.  HEENT: Head atraumatic,  normocephalic. Oropharynx and nasopharynx clear.  NECK:  Supple, no jugular venous distention. No thyroid enlargement, no tenderness.  LUNGS: Normal breath sounds bilaterally, no wheezing, rales, rhonchi. No use of accessory muscles of respiration. Positive right-sided Pleurx catheter in place. CARDIOVASCULAR: S1, S2 normal. 2/6 systolic ejection murmur at the left sternal border, no rubs, or gallops.  ABDOMEN: Soft, tender in the right lower quadrant, no rebound, rigidity. nondistended. Bowel sounds present. No organomegaly or mass. Positive peritoneal dialysis catheter in place without any drainage. EXTREMITIES: No cyanosis, clubbing or edema b/l.    NEUROLOGIC: Cranial nerves II through XII are intact. No focal Motor or sensory deficits b/l. Globally weak  PSYCHIATRIC: The patient is alert and oriented x 3.  SKIN: No obvious rash, lesion, or ulcer.    LABORATORY PANEL:   CBC  Recent Labs Lab 04/07/16 1612  WBC 7.7  HGB 9.4*  HCT 28.7*  PLT 212   ------------------------------------------------------------------------------------------------------------------  Chemistries   Recent Labs Lab 04/07/16 1612  NA 135  K 4.8  CL 98*  CO2 23  GLUCOSE 93  BUN 60*  CREATININE 8.91*  CALCIUM 8.7*  AST 77*  ALT 39  ALKPHOS 86  BILITOT 1.1   ------------------------------------------------------------------------------------------------------------------  Cardiac Enzymes  Recent Labs Lab 04/07/16 1612  TROPONINI 0.08*   ------------------------------------------------------------------------------------------------------------------  RADIOLOGY:  No results found.   ASSESSMENT AND PLAN:   55 year old female with past medical history of end-stage renal disease on peritoneal dialysis, cirrhosis, anemia of chronic disease, carotid artery stenosis, GERD, history of rheumatic heart disease with mitral regurgitation, previous TIA, upper GI bleed who presented to the hospital  due to  abdominal pain.  1. Abdominal pain-etiology unclear. Patient's CT scan of the abdomen and pelvis shows no evidence of acute pathology. -Patient is clinically afebrile, hemodynamically stable with a normal white cell count. - today the patient had some nausea with some dry heaving. -Peritoneal fluid analysis and pleural fluid analysis are negative for any evidence of infection. -Patient's pain has not improved despite having extra fluid removed. We will get gastroenterology consult and discussed with Dr. Mechele CollinElliott and possibly plan for endoscopy tomorrow. -Continue morphine, oxycodone as needed for pain.  2. End-stage renal disease on peritoneal dialysis-nephrology has been consulted. Continue care as per them. -Peritoneal fluid analysis negative.  3. Secondary hyperparathyroidism-continue Sensipar, Phoslo.  4. GERD-continue Protonix.  5.Depression - cont. Celexa.   6. Hx of PVD - cont. Plavix, Statin.     All the records are reviewed and case discussed with Care Management/Social Workerr. Management plans discussed with the patient, family and they are in agreement.  CODE STATUS: Full Code  DVT Prophylaxis: TEd's & SCD's.   TOTAL TIME TAKING CARE OF THIS PATIENT: 30 minutes.   POSSIBLE D/C IN 1-2 DAYS, DEPENDING ON CLINICAL CONDITION.   Houston SirenSAINANI,Laterica Matarazzo J M.D on 04/10/2016 at 2:02 PM  Between 7am to 6pm - Pager - [phone]  After 6pm go to [email or web address] - password [email or web address] [facility]  [facility]  Office  [phone]  CC: Primary care physician; Sherlene ShamsULLO, TERESA L, MD

## 2016-04-10 NOTE — Progress Notes (Signed)
Peritoneal dialysis disconnected 

## 2016-04-10 NOTE — Progress Notes (Signed)
Per Dr. Cherlynn KaiserSainani, drain pleurx tube per pt. Schedule of every other day.  300ml of clear yellow fluid drained. Pt tolerated well.

## 2016-04-10 NOTE — Progress Notes (Signed)
Post peritoneal dialysis 

## 2016-04-10 NOTE — Progress Notes (Signed)
Subjective:  Patient presented to the hospital for lower chest/abdominal pain.  Patient has stroke and has aphasia. She continues to report pain and points towards her right abdomen when asked about pain.  Peritoneal dialysis is going well. Total ultrafiltration 1300 cc from the toenail dialysis Patient reports nausea this morning, dry heaves Peritoneal dialysis fluid count shows WBC of 32 with 78% lymphocytes  Objective:  Vital signs in last 24 hours:  Temp:  [98 F (36.7 C)-99.3 F (37.4 C)] 98.6 F (37 C) (04/27 0535) Pulse Rate:  [106-113] 113 (04/27 0535) Resp:  [15-20] 18 (04/27 0535) BP: (108-130)/(81-92) 113/84 mmHg (04/27 0535) SpO2:  [96 %] 96 % (04/27 0535) Weight:  [48.807 kg (107 lb 9.6 oz)] 48.807 kg (107 lb 9.6 oz) (04/27 0500)  Weight change: -4.309 kg (-9 lb 8 oz) Filed Weights   04/08/16 0500 04/09/16 0500 04/10/16 0500  Weight: 50.803 kg (112 lb) 53.116 kg (117 lb 1.6 oz) 48.807 kg (107 lb 9.6 oz)    Intake/Output:    Intake/Output Summary (Last 24 hours) at 04/10/16 1347 Last data filed at 04/10/16 1300  Gross per 24 hour  Intake    600 ml  Output   1868 ml  Net  -1268 ml     Physical Exam: General: No acute distress, laying in the bed  HEENT Anicteric, moist oral mucous membranes  Neck supple  Pulm/lungs Clear to auscultation bilaterally  CVS/Heart Irregular, tachycardic  Abdomen:  Right lower quadrant tenderness to deep palpation, PD catheter intact  Extremities: + dependent peripheral edema  Neurologic: Alert, follows commands, aphasic  Skin: No acute rashes  Access: PD catheter in place       Basic Metabolic Panel:   Recent Labs Lab 04/07/16 1612  NA 135  K 4.8  CL 98*  CO2 23  GLUCOSE 93  BUN 60*  CREATININE 8.91*  CALCIUM 8.7*     CBC:  Recent Labs Lab 04/07/16 1612  WBC 7.7  NEUTROABS 6.1  HGB 9.4*  HCT 28.7*  MCV 84.5  PLT 212      Microbiology:  No results found for this or any previous visit (from the  past 720 hour(s)).  Coagulation Studies:  Recent Labs  04/07/16 1612  LABPROT 14.3  INR 1.09    Urinalysis:  Recent Labs  04/07/16 1943  COLORURINE YELLOW*  LABSPEC 1.016  PHURINE 8.0  GLUCOSEU 150*  HGBUR NEGATIVE  BILIRUBINUR NEGATIVE  KETONESUR NEGATIVE  PROTEINUR >500*  NITRITE NEGATIVE  LEUKOCYTESUR 2+*      Imaging: No results found.   Medications:     . aspirin  81 mg Oral Daily  . atorvastatin  80 mg Oral q1800  . calcitRIOL  0.5 mcg Oral Daily  . calcium acetate  2,001 mg Oral TID WC  . cinacalcet  180 mg Oral Q supper  . citalopram  20 mg Oral Daily  . clopidogrel  75 mg Oral Daily  . dialysis solution 2.5% low-MG/low-CA   Intraperitoneal Q24H  . dialysis solution 4.25% low-MG/low-CA   Intraperitoneal Q24H  . docusate sodium  100 mg Oral BID  . gentamicin cream  1 application Topical Daily  . magnesium oxide  400 mg Oral Daily  . pantoprazole  40 mg Oral BID AC  . sodium chloride  2 g Oral BID WC  . sucralfate  1 g Oral TID WC & HS  . tiotropium  18 mcg Inhalation Daily   acetaminophen **OR** acetaminophen, albuterol, docusate sodium, heparin, morphine injection,  ondansetron **OR** ondansetron (ZOFRAN) IV, ondansetron, oxyCODONE  Assessment/ Plan:  55 y.o.African American female  with history of end-stage renal disease (FSGS) currently on PD, paroxysmal atrial fibrillation, COPD, cirrhosis, stroke with right-sided weakness 2017,severe mitral stenosis,depression, thrombocytopenia, and negative anticardiolipin antibody, weakly positive ANA, acute GI bleed in January 2017 caused by severe esophagitis and bleeding rectal ulcer, IVC filter, pulmonary arterial hypertension, right-sided heart dysfunction consistent with cor pulmonale, significant venous engorgement of the suprahepatic IVC and hepatic veins suspicious for congestive hepatopathy and cardiac cirrhosis.  1. End-stage renal disease on peritoneal dialysis. Continue peritoneal dialysis with 8  hours, 6 exchanges, fill volume 1600 mL, -  Use 2.5 % tonight for volume removal  2. Right pleural effusion. Likely related to high right-sided pressures and pulmonary hypertension given her mitral valve disease. She will need valve replacement per cardiology. - Currently managed with periodic removal of fluid from pleurex catheter  3. Secondary hyperparathyroidism.  Monitor phos - Continue calcium acetate, calcitriol, and Sensipar. Taking 180 mg at home - will hold Sensipar since patient's food intake is very poor  4. Anemia chronic kidney disease. Hemoglobin 9.4 -  EPO being held due to recent stroke  5. Mitral valve disease.  - Pleural effusions managed with plurex catheter drainage every other day   6. Abdominal pain - PD fluid negative for peritonitis ? Passive congestion in liver causing pain - EGD planned for tomorrow   LOS:  Hussein Macdougal 4/27/20171:47 PM

## 2016-04-10 NOTE — Progress Notes (Signed)
Peritoneal dialysis started.

## 2016-04-10 NOTE — Consult Note (Signed)
Patient known to me from previous consult a few months ago, she has severe problems with vascular disease, previous CVA, [facility]  Previous CT showed diffuse thickening through out the colon.  [facility] showed erosive gastritis. She has  end stage kidney disease on peritoneal dialysis.  No acute bleeding.  Her main pain is located in right mid lower abdomen which is a little fuller than on the left.  I have ordered a Dulcolax supp instead of enema at this time and will follow her tomorrow and get repeat abd film tomorrow.  Consider pain patch for help with control of pain.  No endoscopic procedures needed at this time as an EGD and colonoscopy were done less than 6 months ago.

## 2016-04-10 NOTE — Consult Note (Signed)
Consultation  Referring Provider: Dr. Cherlynn Kaiser Primary Care Physician:  Sherlene Shams, MD Consulting  Gastroenterologist:      Dr. Lynnae Prude   Reason for Consultation: Abdominal pain         HPI:   Dorothy Long is a 55 y.o. female with history of end-stage renal disease currently on PD, paroxysmal atrial fibrillation, COPD, cirrhosis, stroke,severe mitral stenosis,depression, thrombocytopenia,  acute GI bleed in January 2017 caused by severe esophagitis and bleeding rectal ulcer, IVC filter, pulmonary arterial hypertension, right-sided heart dysfunction consistent with cor pulmonale, significant venous engorgement of the suprahepatic IVC and hepatic veins suspicious for congestive hepatopathy and cardiac cirrhosis,critical stenosis versus short focal occlusion of the origin of the celiac artery, atrophic renal arteries bilaterally with associated atrophy of the native kidneys, peripheral arterial disease with high-grade stenosis versus short segment occlusion of the left common iliac artery, moderate to high-grade stenosis of the proximal right common iliac artery, high-grade stenosis of the origin of the left internal iliac artery, and mild to moderate superficial femoral artery disease (worse on the left than the right),  right pleural effusion with associated right lower lobe atelectasis. She had a chest catheter placed for drainage of pleural effusion in March and it remains in place.   She came to the ED 04/07/2016 for right mid lateral quadrant abdominal sharp continuous pain below this catheter site. This pain onset 1 week or so ago. She rates it 7/10 now, 10/10 on admission. Aspiration of chest  fluid helped lower the pain today. She had a BM maybe 2 days ago, but feels constipated. She reports experiencing this same pain in the past, but cannot recall what caused it or how she resolved it. She says that she believes she would feel better if she could have a good BM.  She has been  requesting morphine every 3 hours. She was place on oxycodone today and shortly after receiving her dose, she had 2 episodes of vomiting. She reports vomiting at home after her hydrocodone sometimes. Nursing staff report a greenish emesis 200 cc with first vomiting and about 50 cc with the second vomiting this morning. The emesis had pieces of corn from last eve dinner. No change in the RML abd pain with eating, or vomiting. She has  not eaten today. She says that she stopped her daily ibuprofen 3 mos ago with the EGD findings. She takes her PPI and Carafate as directed, but to ask her husband as he monitors her meds.   Her ED labs are stable, Hgb good for her. The dialysate fluid sent and is clear. Her abdominal CT on admission was negative for acute findings to explain her pain.  The pleural cultures/labs sent and pending.    Past Medical History  Diagnosis Date  . Hypertension   . Hyperlipidemia   . COPD (chronic obstructive pulmonary disease) (HCC)     a. not on home O2  . Anemia   . Valvular disease     a. 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 echo 2015: EF 45-50%, borderline LVH, mod dilated LA, mildly dilated RA, small pericardial effusion, mod MR (rheumatic deformity), MS mild-mod, no gradient, PASP ele  . Chronic combined systolic and diastolic CHF (congestive heart failure) (HCC)   . PAF (paroxysmal atrial fibrillation) (HCC)  a. in setting of diarrhea 09/21/2013; b. not on long term full dose anticoagulation; c. CHADSVASC at least 4 (CHF, HTN, DM, female)  . Diabetes mellitus (HCC)   . Peritoneal dialysis status (HCC) 4- 15-15  . Ventral hernia   . Inguinal hernia   . ESRD (end stage renal disease) on dialysis (HCC)     a. HD on T,T,S; b. 2/2 focal segmental glomerulosclerosis   . Dialysis patient  (HCC)   . Renal insufficiency   . Stroke [facility](HCC) 11/2015    a. MRI L cereblal infarctions in the MCA/PCA, ACA/MCA territory; b. felt to be carotid in etiology; c. medically managed on DAPT per neuro  . Anemia in chronic kidney disease   . Aortic arch atherosclerosis (HCC)   . Carotid stenosis   . Chronic kidney disease with peritoneal dialysis as preferred modality, stage 5 (HCC) 07/14/2013    Secondary to FSGS with atrophic right kidney.    . 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 GERD (gastroesophageal reflux disease)   . Hyperparathyroidism due to renal insufficiency (HCC)   . Mitral valve stenosis, severe   . Mitral stenosis with incompetence or regurgitation     EF 45 to 50% Rheumatic mitral valve Mild mitral stenosis. Mean gradient  14 mm Hg Moderate mitral regirg Severe thickening and calcificatin of posterior mitral leaglet.  Mild aortic regurgitation , sclerosis without stenosis Mild tricuspid regurgitation ((*Per ECHO report July 25, [facility])   . Rheumatic mitral stenosis with regurgitation   . NSTEMI (non-ST elevated myocardial infarction) (HCC)   . Paroxysmal atrial fibrillation (HCC) 09/25/2013  . Pleural effusion on right   . TIA (transient ischemic attack) 12/11/2015  . Upper GI bleed 02/06/2016  . Asthma     Past Surgical History  Procedure Laterality Date  . Abdominal hysterectomy    . Cholecystectomy    . Knee surgery Right   . Foot surgery Bilateral   . Av fistula placement Left 10-26-13    Dr Gilda CreaseSchnier  . Peritoneal catheter insertion  03-29-14    Dr Gilda CreaseSchnier  . Inguinal hernia repair Left 07/13/2015    Procedure: HERNIA REPAIR INGUINAL ADULT;  Surgeon: Earline MayotteJeffrey W Byrnett, MD;  Location: [facility] ORS;  Service: General;  Laterality: Left;  Marland Kitchen. Ventral hernia repair N/A 07/13/2015    Procedure: HERNIA REPAIR VENTRAL ADULT;  Surgeon: Earline MayotteJeffrey W Byrnett, MD;  Location: [facility] ORS;  Service: General;  Laterality: N/A;  . Cardiac catheterization N/A 03/12/2016     Procedure: Right/Left Heart Cath and Coronary Angiography;  Surgeon: Corky CraftsJayadeep S Varanasi, MD;  [facility];  Service: Cardiovascular;  Laterality: N/A;  . Chest tube insertion Right 03/13/2016    Procedure: INSERTION PLEURAL DRAINAGE CATHETER;  Surgeon: Purcell Nailslarence H Owen, MD;  Location: [facility] OR;  Service: Thoracic;  Laterality: Right;    Family History  Problem Relation Age of Onset  . Cancer Mother     colon  . Heart disease Father   . Hypertension Father      Social History  Substance Use Topics  . Smoking status: Former Smoker -- 0.00 packs/day for 0 years  . Smokeless tobacco: Never Used  . Alcohol Use: No     Comment: occasional    Prior to Admission medications   Medication Sig Start Date End Date Taking? Authorizing Provider  acetaminophen (TYLENOL) 325 MG tablet Take 650 mg by mouth every 4 (four) hours as needed for mild pain or headache.    Yes Historical Provider, MD  albuterol (PROVENTIL HFA;VENTOLIN HFA) 108 (90 Base) MCG/ACT inhaler Inhale 2 puffs into the lungs every 6 (six) hours as needed for wheezing or shortness of breath.   Yes Historical Provider, MD  aspirin 81 MG chewable tablet Chew 81 mg by mouth daily.   Yes Historical Provider, MD  atorvastatin (LIPITOR) 80 MG tablet Take 80 mg by mouth daily.    Yes Historical Provider, MD  calcitRIOL (ROCALTROL) 0.5 MCG capsule Take 0.5 mcg by mouth daily.    Yes Historical Provider, MD  calcium acetate (PHOSLO) 667 MG capsule Take 2,001 mg by mouth 3 (three) times daily with meals.   Yes Historical Provider, MD  cinacalcet (SENSIPAR) 30 MG tablet Take 30 mg by mouth daily with breakfast.    Yes Historical Provider, MD  citalopram (CELEXA) 20 MG tablet Take 20 mg by mouth daily.   Yes Historical Provider, MD  clopidogrel (PLAVIX) 75 MG tablet Take 75 mg by mouth daily.   Yes Historical Provider, MD  docusate sodium (COLACE) 100 MG capsule Take 100 mg by mouth daily as needed for mild constipation.    Yes  Historical Provider, MD  epoetin alfa (EPOGEN,PROCRIT) 16109 UNIT/ML injection Inject 25,000 Units into the skin once a week.   Yes Historical Provider, MD  magnesium oxide (MAG-OX) 400 (241.3 Mg) MG tablet Take 1 tablet (400 mg total) by mouth daily. 03/14/16  Yes Richarda Overlie, MD  ondansetron (ZOFRAN-ODT) 4 MG disintegrating tablet Take 4 mg by mouth every 8 (eight) hours as needed for nausea or vomiting.    Yes Historical Provider, MD  oxyCODONE (ROXICODONE) 5 MG immediate release tablet Take 1 tablet (5 mg total) by mouth every 6 (six) hours as needed for severe pain. 03/14/16  Yes Richarda Overlie, MD  pantoprazole (PROTONIX) 40 MG tablet Take 40 mg by mouth 2 (two) times daily.    Yes Historical Provider, MD  sodium chloride 1 g tablet Take 2 g by mouth 2 (two) times daily with a meal.   Yes Historical Provider, MD  sucralfate (CARAFATE) 1 GM/10ML suspension Take 10 mLs (1 g total) by mouth every 6 (six) hours. 02/12/16  Yes Houston Siren, MD  tiotropium (SPIRIVA) 18 MCG inhalation capsule Place 1 capsule (18 mcg total) into inhaler and inhale daily. 08/22/15  Yes Carollee Leitz, NP  HYDROcodone-acetaminophen (NORCO/VICODIN) 5-325 MG tablet Take 1 tablet by mouth every 6 (six) hours as needed for moderate pain. Patient not taking: Reported on 04/07/2016 03/17/16   Sherlene Shams, MD    Current Facility-Administered Medications  Medication Dose Route Frequency Provider Last Rate Last Dose  . acetaminophen (TYLENOL) tablet 650 mg  650 mg Oral Q6H PRN Arnaldo Natal, MD       Or  . acetaminophen (TYLENOL) suppository 650 mg  650 mg Rectal Q6H PRN Arnaldo Natal, MD      . albuterol (PROVENTIL) (2.5 MG/3ML) 0.083% nebulizer solution 3 mL  3 mL Inhalation Q6H PRN Arnaldo Natal, MD      . aspirin chewable tablet 81 mg  81 mg Oral Daily Arnaldo Natal, MD   81 mg at 04/10/16 6045  . atorvastatin (LIPITOR) tablet 80 mg  80 mg Oral q1800 Arnaldo Natal, MD   80 mg at 04/09/16 1740  .  calcitRIOL (ROCALTROL) capsule 0.5 mcg  0.5 mcg Oral Daily Arnaldo Natal, MD   0.5 mcg at 04/10/16 4098  . calcium acetate (PHOSLO) capsule 2,001 mg  2,001 mg Oral TID  WC Arnaldo Natal, MD   2,001 mg at 04/10/16 [phone]  . citalopram (CELEXA) tablet 20 mg  20 mg Oral Daily Arnaldo Natal, MD   20 mg at 04/10/16 0917  . clopidogrel (PLAVIX) tablet 75 mg  75 mg Oral Daily Arnaldo Natal, MD   75 mg at 04/10/16 9604  . dialysis solution 2.5% low-MG/low-CA dianeal solution   Intraperitoneal Q24H Mosetta Pigeon, MD   8 L at 04/08/16 2105  . docusate sodium (COLACE) capsule 100 mg  100 mg Oral Daily PRN Arnaldo Natal, MD      . docusate sodium (COLACE) capsule 100 mg  100 mg Oral BID Arnaldo Natal, MD   100 mg at 04/10/16 5409  . gentamicin cream (GARAMYCIN) 0.1 % 1 application  1 application Topical Daily Harmeet Singh, MD   1 application at 04/08/16 1220  . heparin 1000 unit/ml injection 500 Units  500 Units Intraperitoneal PRN Harmeet Singh, MD      . magnesium oxide (MAG-OX) tablet 400 mg  400 mg Oral Daily Arnaldo Natal, MD   400 mg at 04/10/16 0917  . morphine 2 MG/ML injection 2 mg  2 mg Intravenous Q6H PRN Houston Siren, MD      . ondansetron [facility]) tablet 4 mg  4 mg Oral Q6H PRN Arnaldo Natal, MD   4 mg at 04/10/16 0915   Or  . ondansetron (ZOFRAN) injection 4 mg  4 mg Intravenous Q6H PRN Arnaldo Natal, MD   4 mg at 04/08/16 1226  . ondansetron (ZOFRAN-ODT) disintegrating tablet 4 mg  4 mg Oral Q8H PRN Arnaldo Natal, MD      . oxyCODONE (Oxy IR/ROXICODONE) immediate release tablet 5-10 mg  5-10 mg Oral Q4H PRN Houston Siren, MD   10 mg at 04/10/16 1130  . pantoprazole (PROTONIX) EC tablet 40 mg  40 mg Oral BID AC Arnaldo Natal, MD   40 mg at 04/10/16 8119  . sodium chloride tablet 2 g  2 g Oral BID WC Arnaldo Natal, MD   2 g at 04/10/16 1478  . sucralfate (CARAFATE) 1 GM/10ML suspension 1 g  1 g Oral TID WC & HS Arnaldo Natal, MD   1 g at  04/10/16 1139  . tiotropium (SPIRIVA) inhalation capsule 18 mcg  18 mcg Inhalation Daily Arnaldo Natal, MD   18 mcg at 04/10/16 0915    Allergies as of 04/07/2016 - Review Complete 04/07/2016  Allergen Reaction Noted  . Buprenorphine Nausea And Vomiting 03/04/2016  . Ciprocinonide [fluocinolone] Hives 07/26/2013  . Demerol [meperidine] Nausea And Vomiting 07/26/2013  . Diflucan [fluconazole] Nausea And Vomiting 07/21/2012  . Flagyl [metronidazole] Hives and Itching 07/26/2013  . Hydrocodone Itching 07/24/2015  . Levaquin [levofloxacin] Other (See Comments) 03/04/2016  . Tetracyclines & related Itching and Swelling 07/21/2012  . Valium [diazepam] Nausea And Vomiting and Other (See Comments) 07/21/2012     Review of Systems:    A 12 system review and positive for GI history as noted in HPI. Some increased shortness of breath. No CP or  palpitations. No fever, chills. Nursing reports edema lower extremity  on admission and now resolved. Patient denies depression. She tells me she last worked in 2014. She lives a bed to chair to bathroom existence at home and she walks sometimes. Known aphasia and reports no new neurological symptoms. She may have weight loss.    Physical Exam:  Vital signs in last  24 hours: Temp:  [98.2 F (36.8 C)-99.3 F (37.4 C)] 98.2 F (36.8 C) (04/27 1400) Pulse Rate:  [106-113] 108 (04/27 1400) Resp:  [18-20] 18 (04/27 1400) BP: (110-115)/(84-92) 115/92 mmHg (04/27 1400) SpO2:  [96 %-97 %] 97 % (04/27 1400) Weight:  [48.807 kg (107 lb 9.6 oz)]  (04/27 0500) Last BM Date: 04/07/16  General: Thin, frail, chronically ill, disheveled patient in no distress. She is alone. Her husband was not present. History obtained from the patient and extensive chart review.   Head:  Head without obvious abnormality, atraumatic  Eyes:   Conjunctiva pink, sclera anicteric   ENT:   Mouth free of lesions, mucosa moist, tongue pink, no thrush noted, some missing teeth  Neck:    Supple w/o thyromegaly or mass, trachea midline, engorged neck veins obvious  Lungs: Clear to auscultation bilaterally, respirations unlabored, some decreased breath sounds in the bases Heart:     Normal S1S2, positive  murmur Abdomen: Flat, mild tender beside umbilicus right side. No guarding or re bound. This is well below the bandage of the chest Pleuryx catheter. PD catheter on left side-no tenderness there.  Rectal: Rectum may be a little dilated, stool smears- light brown, no impaction, but examiner could not penetrate for patient complained of pain and exam aborted.  Lymph:  No cervical or supraclavicular adenopathy. Extremities:   No edema, cyanosis, or clubbing Skin  Skin color, texture, turgor normal, no rashes or lesions Neuro:  A&O x 3. Aphasia at times, maintains eye contact, answers questions consistently.  Psych:  Appropriate mood and affect.  Data Reviewed:  LAB RESULTS:  Recent Labs  04/07/16 1612  WBC 7.7  HGB 9.4*  HCT 28.7*  PLT 212   BMET  Recent Labs  04/07/16 1612  NA 135  K 4.8  CL 98*  CO2 23  GLUCOSE 93  BUN 60*  CREATININE 8.91*  CALCIUM 8.7*   LFT  Recent Labs  04/07/16 1612  PROT 6.3*  ALBUMIN 2.6*  AST 77*  ALT 39  ALKPHOS 86  BILITOT 1.1  BILIDIR 0.3  IBILI 0.8   PT/INR  Recent Labs  04/07/16 1612  LABPROT 14.3  INR 1.09    STUDIES: 04/07/2016:  CLINICAL DATA: Right-sided abdominal pain. Right-sided chest pain at site of tunneled pleural catheter.  EXAM: CT ANGIOGRAPHY CHEST  CT ABDOMEN AND PELVIS WITH CONTRAST  TECHNIQUE: Multidetector CT imaging of the chest was performed using the standard protocol during bolus administration of intravenous contrast. Multiplanar CT image reconstructions and MIPs were obtained to evaluate the vascular anatomy. Multidetector CT imaging of the abdomen and pelvis was performed using the standard protocol during bolus administration of intravenous contrast.  CONTRAST: 75  cc Isovue 370 intravenous  COMPARISON: Abdominal CT 02/08/2016  FINDINGS: CTA CHEST FINDINGS  THORACIC INLET/BODY WALL:  No acute finding.  MEDIASTINUM:  Cardiomegaly with left atrial and right heart dilatation. Mitral valve calcification which is bulky and may be caseous. Extensive atherosclerosis. Pulmonary hypertension with dilated pulmonary arterial tree. Intermittent motion degradation. No suspected acute pulmonary embolism. No acute aortic finding.  LUNG WINDOWS:  Moderate right pleural effusion with some posterior and fissural loculation but no pleural thickening or nodularity. A tunneled pleural catheter is in unremarkable position, contiguous with pleural fluid. Mild fluid in the chest wall at site of catheter insertion, without rim enhancing collection. Dependent atelectasis, greater on the right. Mild septal thickening at the apices but no overt pulmonary edema.  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  CT ABDOMEN and PELVIS FINDINGS  Abdominal wall: No contributory findings.  Hepatobiliary: Cirrhotic morphology. Nutmeg pattern enhancement attributed to passive congestion.Cholecystectomy with negative common bile duct.  Pancreas: Unremarkable.  Spleen: Unremarkable.  Adrenals/Urinary Tract: Negative adrenals. Marked renal atrophy with absent enhancement. Unremarkable bladder.  Reproductive:Hysterectomy. Negative adnexa.  Stomach/Bowel: No obstruction. No inflammatory changes.  Vascular/Lymphatic: No acute vascular abnormality. IVC filter in place. Extensive atherosclerosis. High-grade left common iliac artery stenosis from bulky calcified plaque. No mass or adenopathy.  Peritoneal: Small volume ascites/dialysate. Tenckhoff catheter in the right pelvis.  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  Review of the MIP images confirms the above findings.  IMPRESSION: 1. No  acute finding or change since prior. Negative for pulmonary embolism. 2. Moderate right pleural effusion with tunneled pleural catheter in place. 3. Cirrhosis. 4. Additional chronic findings are described above.   Electronically Signed  By: Marnee Spring M.D.  On: 04/08/2016 00:14                                   _______________________________________________________________________________  Procedure:         Colonoscopy Indications:       Hematochezia Providers:         Liane Comber, MD, PHILIP J. BRONDON (Fellow) Referring MD:       Medicines:         Sedation Administered by a Nurse in MICU Complications:     No immediate complications. _______________________________________________________________________________                                                                                Findings:      The perianal and digital rectal examinations were normal.      The terminal ileum appeared normal.      Multiple small and large-mouthed diverticula were found in the sigmoid       colon and in the descending colon.      A diffuse area of moderately friable mucosa with contact bleeding was       found in the entire colon.      A single (solitary) five mm ulcer was found in the rectum. No bleeding       was present. Stigmata of recent bleeding were present. For hemostasis,       one hemostatic clip was successfully placed. There was no bleeding       during, and at the end, of the procedure.      The exam was otherwise without abnormality on direct and retroflexion       views.      - due to technical issues, all pictures taken were saved locally but       could not be tranferred to the report.  Impression:        - The examined portion of the ileum was normal.                    - Diverticulosis in the sigmoid colon and in the                     descending colon.                     - Friability with contact bleeding in the entire examined                     colon.                    - A single (solitary) ulcer in the rectum. Clip was placed.                    - The examination was otherwise normal on direct and                     retroflexion views.                    - No specimens collected.                    - bleeding source likely either diverticular or ulcer Electronically Signed By Liane Comber, MD _______________________________________________________________________________ 12/28/2015 Procedure: Upper GI endoscopy Indications: Melena, Melena Providers: Liane Comber, MD, Trula Ore Alphonzo Dublin (Fellow) Referring MD:  Medicines: Propofol per Anesthesia Complications: No immediate complications.  Findings: LA Grade A (one or more mucosal breaks less than 5 mm, not extending  between tops of 2 mucosal folds) esophagitis with no bleeding was found. Diffuse mild inflammation characterized by friability, granularity and  linear erosions was found in the entire examined stomach. Patchy moderate inflammation characterized by erythema, friability and  granularity was found in the entire duodenum.  Impression: - LA Grade A esophagitis. Improved from prior EGD. - Gastritis. - Duodenitis  Electronically Signed By Liane Comber, MD    Assessment:  Dorothy Long is a 55 y.o.  with history of end-stage renal disease currently on PD, paroxysmal atrial fibrillation, COPD, stroke,severe mitral stenosis, depression, thrombocytopenia,  acute GI bleed in January 2017 caused by severe esophagitis and bleeding rectal ulcer, IVC filter, pulmonary arterial hypertension, right-sided heart dysfunction consistent with cor pulmonale, significant venous engorgement of the suprahepatic IVC and hepatic veins suspicious for congestive hepatopathy and cardiac cirrhosis,vascular disease, right pleural effusion with associated right lower lobe atelectasis, placement of  chest  catheter in March for drainage of pleural effusion presents with a 1 week hx of right sided mid/lateral abdominal pain. CT and labs unrevealing.  2. Her colonoscopy in Jan 2017  showed diverticulosis in the sigmoid colon and in the descending colon, friability with contact bleeding in the entire examined colon, a single (solitary) ulcer in the rectum. Clip was placed. 3. Her most recent EGD in Jan showed LA Grade A esophagitis. Improved from prior EGD,  Gastritis, Duodenitis. 4. Cardiac cirrhosis  5. Etiology of abdominal pain and one day vomiting today after oral hydrocodone  to consider narcotic bowel, constipation, duodenitis, PUD. 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 No colitis seen on recent  on CT.     Plan:  1. Flat plate of abd considered  today,  but Dr. Mechele Collin recommends tap water enema now and xray in the AM. 2.  There is no indication for repeat luminal evaluation in the absence of active GI bleed. Her DRE showed light brown stool and Hgb is stable.  3. Her vomiting could be r/t the dosing of hydrocodone as pt says she vomits sometimes after taking that narcotic. She has known esophagitis, gastritis, duodenitis on recent EGD-consider exacerbation. She reports  no more Ibuprofen for 3 mos. Continue Pantoprazole, Carafate, Zofran.  4. Monitor for signs of gastroparesis 5. Daily Hgb and monitor response. 6. She definitely needs more nutritional calories and reports problems getting her nutrition supplements. She has been seen by the RD. Further GI recommendations pending her clinical response.   This case was discussed with Dr. Scot Jun in collaboration of care. Thank you for the consultation.  These services provided by Amedeo Kinsman RN, MSN, ANP-BC under collaborative practice agreement with Scot Jun, MD.  04/10/2016, 3:47 PM

## 2016-04-11 ENCOUNTER — Telehealth: Payer: Self-pay | Admitting: Internal Medicine

## 2016-04-11 ENCOUNTER — Observation Stay: Payer: BC Managed Care – PPO

## 2016-04-11 ENCOUNTER — Telehealth: Payer: Self-pay

## 2016-04-11 LAB — CBC WITH DIFFERENTIAL/PLATELET
Basophils Absolute: 0.1 10*3/uL (ref 0–0.1)
Basophils Relative: 1 %
Eosinophils Absolute: 0.2 10*3/uL (ref 0–0.7)
Eosinophils Relative: 2 %
HCT: 26.6 % — ABNORMAL LOW (ref 35.0–47.0)
Hemoglobin: 8.8 g/dL — ABNORMAL LOW (ref 12.0–16.0)
Lymphocytes Relative: 6 %
Lymphs Abs: 0.5 10*3/uL — ABNORMAL LOW (ref 1.0–3.6)
MCH: 27.6 pg (ref 26.0–34.0)
MCHC: 33 g/dL (ref 32.0–36.0)
MCV: 83.7 fL (ref 80.0–100.0)
Monocytes Absolute: 0.9 10*3/uL (ref 0.2–0.9)
Monocytes Relative: 11 %
Neutro Abs: 6.9 10*3/uL — ABNORMAL HIGH (ref 1.4–6.5)
Neutrophils Relative %: 80 %
Platelets: 221 10*3/uL (ref 150–440)
RBC: 3.18 MIL/uL — ABNORMAL LOW (ref 3.80–5.20)
RDW: 17.5 % — ABNORMAL HIGH (ref 11.5–14.5)
WBC: 8.6 10*3/uL (ref 3.6–11.0)

## 2016-04-11 LAB — HEPATIC FUNCTION PANEL
ALT: 24 U/L (ref 14–54)
AST: 19 U/L (ref 15–41)
Albumin: 2.2 g/dL — ABNORMAL LOW (ref 3.5–5.0)
Alkaline Phosphatase: 69 U/L (ref 38–126)
Bilirubin, Direct: 0.2 mg/dL (ref 0.1–0.5)
Indirect Bilirubin: 0.4 mg/dL (ref 0.3–0.9)
Total Bilirubin: 0.6 mg/dL (ref 0.3–1.2)
Total Protein: 6.3 g/dL — ABNORMAL LOW (ref 6.5–8.1)

## 2016-04-11 NOTE — Telephone Encounter (Signed)
Transition Care Management Follow-up Telephone Call   Date discharged? 04/11/16   How have you been since you were released from the hospital? Abdominal pain has lessened, but not gone.  No N/V/D.  Not at baseline yet. No complaints of chest pain.  Appetite ok. Some cough.   Do you understand why you were in the hospital? YES, Pain and my stomach, back and catheter insertion site.   Do you understand the discharge instructions? YES,  Increasing tolerance slowly.  Stay hydrated.   Where were you discharged to? HOME.   Items Reviewed:  Medications reviewed: YES, taking home medications as prescribed and without issues.  Allergies reviewed: YES, no changes.  Dietary changes reviewed: YES, renal diet, no problems.  Referrals reviewed: YES, appointment schedule with PCP.     Functional Questionnaire:   Activities of Daily Living (ADLs):   She states they are independent in the following: Toileting, grooming, self feeding, dressing. States they require assistance with the following: Ambulating (wheelchair in use), bathing, meal prep.  Assisted by husband when needed.   Any transportation issues/concerns?: NO, husband to bring.   Any patient concerns? None at this time.   Confirmed importance and date/time of follow-up visits scheduled TES, appointment scheduled 04/14/16 at 8:00.  Provider Appointment booked with Dr. Darrick Huntsmanullo (PCP).  Confirmed with patient if condition begins to worsen call PCP or go to the ER.  Patient was given the office number and encouraged to call back with question or concerns.  : YES, verbalized understanding.

## 2016-04-11 NOTE — Progress Notes (Signed)
Patient discharged home with family and home health.  All discharge instructions reviewed and discharge paperwork given to patient.  Patient verbalized understanding.  IV removed in tact. All questions and concerns addressed. Patient's husband at bedside for transfer home.

## 2016-04-11 NOTE — Progress Notes (Signed)
Subjective:  Patient presented to the hospital for lower chest/abdominal pain.  Patient has stroke and has aphasia.  Peritoneal dialysis is going well. Total ultrafiltration 1300 cc from PD No acute c/o today Peritoneal dialysis fluid count shows WBC of 32 with 78% lymphocytes  Objective:  Vital signs in last 24 hours:  Temp:  [98.2 F (36.8 C)-98.3 F (36.8 C)] 98.3 F (36.8 C) (04/28 0546) Pulse Rate:  [96-110] 96 (04/28 0546) Resp:  [17-18] 17 (04/28 0546) BP: (115-118)/(88-92) 118/88 mmHg (04/28 0546) SpO2:  [96 %-97 %] 97 % (04/28 0546) Weight:  [49.17 kg (108 lb 6.4 oz)] 49.17 kg (108 lb 6.4 oz) (04/28 0500)  Weight change: 0.363 kg (12.8 oz) Filed Weights   04/09/16 0500 04/10/16 0500 04/11/16 0500  Weight: 53.116 kg (117 lb 1.6 oz) 48.807 kg (107 lb 9.6 oz) 49.17 kg (108 lb 6.4 oz)    Intake/Output:    Intake/Output Summary (Last 24 hours) at 04/11/16 1345 Last data filed at 04/11/16 1100  Gross per 24 hour  Intake    240 ml  Output   1294 ml  Net  -1054 ml     Physical Exam: General: No acute distress, laying in the bed  HEENT Anicteric, moist oral mucous membranes  Neck supple  Pulm/lungs Clear to auscultation bilaterally  CVS/Heart Irregular, tachycardic  Abdomen:  Right lower quadrant tenderness to deep palpation, PD catheter intact  Extremities: + dependent peripheral edema  Neurologic: Alert, follows commands, aphasic  Skin: No acute rashes  Access: PD catheter in place       Basic Metabolic Panel:   Recent Labs Lab 04/07/16 1612 04/10/16 1639  NA 135 131*  K 4.8 5.3*  CL 98* 93*  CO2 23 20*  GLUCOSE 93 124*  BUN 60* 58*  CREATININE 8.91* 9.19*  CALCIUM 8.7* 7.8*     CBC:  Recent Labs Lab 04/07/16 1612 04/11/16 0823  WBC 7.7 8.6  NEUTROABS 6.1 6.9*  HGB 9.4* 8.8*  HCT 28.7* 26.6*  MCV 84.5 83.7  PLT 212 221      Microbiology:  Recent Results (from the past 720 hour(s))  Culture, body fluid-bottle     Status: None  (Preliminary result)   Collection Time: 04/08/16  9:05 PM  Result Value Ref Range Status   Specimen Description PLEURAL  Final   Special Requests NONE  Final   Culture NO GROWTH 2 DAYS  Final   Report Status PENDING  Incomplete    Coagulation Studies: No results for input(s): LABPROT, INR in the last 72 hours.  Urinalysis: No results for input(s): COLORURINE, LABSPEC, PHURINE, GLUCOSEU, HGBUR, BILIRUBINUR, KETONESUR, PROTEINUR, UROBILINOGEN, NITRITE, LEUKOCYTESUR in the last 72 hours.  Invalid input(s): APPERANCEUR    Imaging: Dg Abd 1 View  04/11/2016  CLINICAL DATA:  Right side abdominal pain for 3 days, nausea and vomiting EXAM: ABDOMEN - 1 VIEW COMPARISON:  04/07/2016 FINDINGS: There is normal small bowel gas pattern. Moderate gas and residual contrast material noted throughout the colon. IVC filter in place. Postcholecystectomy surgical clips are noted. Again noted a peritoneal dialysis catheter with tip within pelvis. IMPRESSION: There is normal small bowel gas pattern. Moderate gas and residual contrast material noted throughout the colon. IVC filter in place. Electronically Signed   By: Dorothy Long M.D.   On: 04/11/2016 08:45     Medications:     . aspirin  81 mg Oral Daily  . atorvastatin  80 mg Oral q1800  . calcitRIOL  0.5 mcg  Oral Daily  . calcium acetate  2,001 mg Oral TID WC  . citalopram  20 mg Oral Daily  . clopidogrel  75 mg Oral Daily  . dialysis solution 2.5% low-MG/low-CA   Intraperitoneal Q24H  . docusate sodium  100 mg Oral BID  . gentamicin cream  1 application Topical Daily  . magnesium oxide  400 mg Oral Daily  . pantoprazole  40 mg Oral BID AC  . sodium chloride  2 g Oral BID WC  . sucralfate  1 g Oral TID WC & HS  . tiotropium  18 mcg Inhalation Daily   acetaminophen **OR** acetaminophen, albuterol, docusate sodium, heparin, morphine injection, ondansetron **OR** ondansetron (ZOFRAN) IV, ondansetron, oxyCODONE  Assessment/ Plan:  55 y.o.African  American female  with history of end-stage renal disease (FSGS) currently on PD, paroxysmal atrial fibrillation, COPD, cirrhosis, stroke with right-sided weakness 2017,severe mitral stenosis,depression, thrombocytopenia, and negative anticardiolipin antibody, weakly positive ANA, acute GI bleed in January 2017 caused by severe esophagitis and bleeding rectal ulcer, IVC filter, pulmonary arterial hypertension, right-sided heart dysfunction consistent with cor pulmonale, significant venous engorgement of the suprahepatic IVC and hepatic veins suspicious for congestive hepatopathy and cardiac cirrhosis.  1. End-stage renal disease on peritoneal dialysis. Continue peritoneal dialysis with 8 hours, 6 exchanges, fill volume 1600 mL, -  Use 2.5 % PD fluids  2. Right pleural effusion. Likely related to high right-sided pressures and pulmonary hypertension given her mitral valve disease. She will need valve replacement per cardiology. - Currently managed with periodic removal of fluid from pleurex catheter  3. Secondary hyperparathyroidism.  Monitor phos - Continue calcium acetate, calcitriol, and Sensipar. Taking 180 mg at home - will hold Sensipar since patient's food intake is very poor  4. Anemia chronic kidney disease. Hemoglobin 9.4 -  EPO being held due to recent stroke  5. Mitral valve disease.  - Pleural effusions managed with plurex catheter drainage every other day   6. Abdominal pain - PD fluid negative for peritonitis ? Passive congestion in liver causing pain - GI eval done. Recent EGD and colonospcopy. H/o gastritis  F/u outpatient   LOS:  Dorothy Long 4/28/20171:45 PM

## 2016-04-11 NOTE — Care Management (Signed)
Patient to discharge today.  Resumption of home health orders placed.  Barbara CowerJason with [facility] notified of discharge.  RNCM signing off

## 2016-04-11 NOTE — Discharge Summary (Signed)
[facility] [place] [facility]   PATIENT NAME: Dorothy CaffeyDonnella Long    MR#:  [id number]  DATE OF BIRTH:  Jun 12, 1961  DATE OF ADMISSION:  04/07/2016 ADMITTING PHYSICIAN: Arnaldo NatalMichael S Diamond, MD  DATE OF DISCHARGE: 04/11/2016 12:27 PM  PRIMARY CARE PHYSICIAN: Sherlene ShamsULLO, TERESA L, MD    ADMISSION DIAGNOSIS:  Abdominal pain, right lateral [R10.9] Chest pain, unspecified chest pain type [R07.9]  DISCHARGE DIAGNOSIS:  Active Problems:   Intractable pain   SECONDARY DIAGNOSIS:   Past Medical History  Diagnosis Date  . Hypertension   . Hyperlipidemia   . COPD (chronic obstructive pulmonary disease) (HCC)     a. not on home O2  . Anemia   . Valvular disease     a. 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 echo 2015: EF 45-50%, borderline LVH, mod dilated LA, mildly dilated RA, small pericardial effusion, mod MR (rheumatic deformity), MS mild-mod, no gradient, PASP ele  . Chronic combined systolic and diastolic CHF (congestive heart failure) (HCC)   . PAF (paroxysmal atrial fibrillation) (HCC)     a. in setting of diarrhea 09/21/2013; b. not on long term full dose anticoagulation; c. CHADSVASC at least 4 (CHF, HTN, DM, female)  . Diabetes mellitus (HCC)   . Peritoneal dialysis status (HCC) 4- 15-15  . Ventral hernia   . Inguinal hernia   . ESRD (end stage renal disease) on dialysis (HCC)     a. HD on T,T,S; b. 2/2 focal segmental glomerulosclerosis   . Dialysis patient (HCC)   . Renal insufficiency   . Stroke [facility](HCC) 11/2015    a. MRI L cereblal infarctions in the MCA/PCA, ACA/MCA territory; b. felt to be carotid in etiology; c. medically managed on DAPT per neuro  . Anemia in chronic kidney disease   . Aortic arch atherosclerosis (HCC)   . Carotid stenosis   . Chronic kidney disease with  peritoneal dialysis as preferred modality, stage 5 (HCC) 07/14/2013    Secondary to FSGS with atrophic right kidney.    . 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 GERD (gastroesophageal reflux disease)   . Hyperparathyroidism due to renal insufficiency (HCC)   . Mitral valve stenosis, severe   . Mitral stenosis with incompetence or regurgitation     EF 45 to 50% Rheumatic mitral valve Mild mitral stenosis. Mean gradient  14 mm Hg Moderate mitral regirg Severe thickening and calcificatin of posterior mitral leaglet.  Mild aortic regurgitation , sclerosis without stenosis Mild tricuspid regurgitation ((*Per ECHO report July 25, [facility])   . Rheumatic mitral stenosis with regurgitation   . NSTEMI (non-ST elevated myocardial infarction) (HCC)   . Paroxysmal atrial fibrillation (HCC) 09/25/2013  . Pleural effusion on right   . TIA (transient ischemic attack) 12/11/2015  . Upper GI bleed 02/06/2016  . Asthma     HOSPITAL COURSE:   55 year old female with past medical history of end-stage renal disease on peritoneal dialysis, cirrhosis, anemia of chronic disease, carotid artery stenosis, GERD, history of rheumatic heart disease with mitral regurgitation, previous TIA, upper GI bleed who presented to the hospital due to abdominal pain.  1. Abdominal pain-she presented to the hospital due to worsening abdominal pain and for pain control. She had an extensive workup including a CT scan of abdomen pelvis which was negative for acute pathology -  she also has a Pleurx catheter and undergoes peritoneal dialysis and both fluids were tested for infection which was negative. Patient remained clinically afebrile and hemodynamically stable on the hospital with a normal white cell count. -To gastroenterology consult was obtained who thought that the patient's symptoms could be related to constipation. She received a Dulcolax suppository. Despite having a bowel movement her pain still persisted. She  clinically has improved. She is being discharged home with follow-up with gastroenterology as an outpatient. As per GI she did not need urgent endoscopy given high-risk procedure and underlying comorbidities.  2. End-stage renal disease on peritoneal dialysis-patient continue her peritoneal dialysis while in the hospital as per nephrology and tolerated it well. -Her peritoneal fluid studies were negative for peritonitis.  3. Secondary hyperparathyroidism-she will continue Sensipar, Phoslo.  4. GERD-she will continue Protonix.  5.Depression - she will cont. Celexa.   6. Hx of PVD - she will cont. Plavix, Statin  DISCHARGE CONDITIONS:   Stable.   CONSULTS OBTAINED:  Treatment Team:  Scot Jun, MD  DRUG ALLERGIES:   Allergies  Allergen Reactions  . Buprenorphine Nausea And Vomiting  . Ciprocinonide [Fluocinolone] Hives  . Demerol [Meperidine] Nausea And Vomiting  . Diflucan [Fluconazole] Nausea And Vomiting  . Flagyl [Metronidazole] Hives and Itching  . Hydrocodone Itching  . Levaquin [Levofloxacin] Other (See Comments)    Reaction: Numbness in legs   . Tetracyclines & Related Itching and Swelling  . Valium [Diazepam] Nausea And Vomiting and Other (See Comments)    Reaction:  Hallucinations     DISCHARGE MEDICATIONS:   Discharge Medication List as of 04/11/2016 12:04 PM    CONTINUE these medications which have NOT CHANGED   Details  acetaminophen (TYLENOL) 325 MG tablet Take 650 mg by mouth every 4 (four) hours as needed for mild pain or headache. , Until Discontinued, Historical Med    albuterol (PROVENTIL HFA;VENTOLIN HFA) 108 (90 Base) MCG/ACT inhaler Inhale 2 puffs into the lungs every 6 (six) hours as needed for wheezing or shortness of breath., Until Discontinued, Historical Med    aspirin 81 MG chewable tablet Chew 81 mg by mouth daily., Until Discontinued, Historical Med    atorvastatin (LIPITOR) 80 MG tablet Take 80 mg by mouth daily. , Until  Discontinued, Historical Med    calcitRIOL (ROCALTROL) 0.5 MCG capsule Take 0.5 mcg by mouth daily. , Until Discontinued, Historical Med    calcium acetate (PHOSLO) 667 MG capsule Take 2,001 mg by mouth 3 (three) times daily with meals., Until Discontinued, Historical Med    cinacalcet (SENSIPAR) 30 MG tablet Take 30 mg by mouth daily with breakfast. , Until Discontinued, Historical Med    citalopram (CELEXA) 20 MG tablet Take 20 mg by mouth daily., Until Discontinued, Historical Med    clopidogrel (PLAVIX) 75 MG tablet Take 75 mg by mouth daily., Until Discontinued, Historical Med    docusate sodium (COLACE) 100 MG capsule Take 100 mg by mouth daily as needed for mild constipation. , Until Discontinued, Historical Med    epoetin alfa (EPOGEN,PROCRIT) 16109 UNIT/ML injection Inject 25,000 Units into the skin once a week., Until Discontinued, Historical Med    magnesium oxide (MAG-OX) 400 (241.3 Mg) MG tablet Take 1 tablet (400 mg total) by mouth daily., Starting 03/14/2016, Until Discontinued, Normal    ondansetron (ZOFRAN-ODT) 4 MG disintegrating tablet Take 4 mg by mouth every 8 (eight) hours as needed for nausea or vomiting. , Until Discontinued, Historical Med    oxyCODONE (ROXICODONE) 5  MG immediate release tablet Take 1 tablet (5 mg total) by mouth every 6 (six) hours as needed for severe pain., Starting 03/14/2016, Until Discontinued, Print    pantoprazole (PROTONIX) 40 MG tablet Take 40 mg by mouth 2 (two) times daily. , Until Discontinued, Historical Med    sodium chloride 1 g tablet Take 2 g by mouth 2 (two) times daily with a meal., Until Discontinued, Historical Med    sucralfate (CARAFATE) 1 GM/10ML suspension Take 10 mLs (1 g total) by mouth every 6 (six) hours., Starting 02/12/2016, Until Discontinued, Print    tiotropium (SPIRIVA) 18 MCG inhalation capsule Place 1 capsule (18 mcg total) into inhaler and inhale daily., Starting 08/22/2015, Until Discontinued, Normal      STOP  taking these medications     HYDROcodone-acetaminophen (NORCO/VICODIN) 5-325 MG tablet          DISCHARGE INSTRUCTIONS:   DIET:  Renal diet  DISCHARGE CONDITION:  Stable  ACTIVITY:  Activity as tolerated  OXYGEN:  Home Oxygen: No.   Oxygen Delivery: room air  DISCHARGE LOCATION:  home   If you experience worsening of your admission symptoms, develop shortness of breath, life threatening emergency, suicidal or homicidal thoughts you must seek medical attention immediately by calling 911 or calling your MD immediately  if symptoms less severe.  You Must read complete instructions/literature along with all the possible adverse reactions/side effects for all the Medicines you take and that have been prescribed to you. Take any new Medicines after you have completely understood and accpet all the possible adverse reactions/side effects.   Please note  You were cared for by a hospitalist during your hospital stay. If you have any questions about your discharge medications or the care you received while you were in the hospital after you are discharged, you can call the unit and asked to speak with the hospitalist on call if the hospitalist that took care of you is not available. Once you are discharged, your primary care physician will handle any further medical issues. Please note that NO REFILLS for any discharge medications will be authorized once you are discharged, as it is imperative that you return to your primary care physician (or establish a relationship with a primary care physician if you do not have one) for your aftercare needs so that they can reassess your need for medications and monitor your lab values.     Today   Having some vague abdominal pain but improved. No nausea, vomiting. Afebrile, hemodynamically. Had a bowel movement with a suppository yesterday.  VITAL SIGNS:  Blood pressure 118/88, pulse 96, temperature 98.3 F (36.8 C), temperature source Oral,  resp. rate 17, height  (1.778 m), weight 49.17 kg (108 lb 6.4 oz), SpO2 97 %.  I/O:   Intake/Output Summary (Last 24 hours) at 04/11/16 1552 Last data filed at 04/11/16 1100  Gross per 24 hour  Intake    240 ml  Output   1294 ml  Net  -1054 ml    PHYSICAL EXAMINATION:   GENERAL: 55 y.o.-year-old patient lying in the bed in no apparent distress.  EYES: Pupils equal, round, reactive to light and accommodation. No scleral icterus. Extraocular muscles intact.  HEENT: Head atraumatic, normocephalic. Oropharynx and nasopharynx clear.  NECK: Supple, no jugular venous distention. No thyroid enlargement, no tenderness.  LUNGS: Normal breath sounds bilaterally, no wheezing, rales, rhonchi. No use of accessory muscles of respiration. Positive right-sided Pleurx catheter in place. CARDIOVASCULAR: S1, S2 normal. 2/6 systolic ejection  murmur at the left sternal border, no rubs, or gallops.  ABDOMEN: Soft, tender in the right lower quadrant, no rebound, rigidity. nondistended. Bowel sounds present. No organomegaly or mass. Positive peritoneal dialysis catheter in place without any drainage. EXTREMITIES: No cyanosis, clubbing or edema b/l.  NEUROLOGIC: Cranial nerves II through XII are intact. No focal Motor or sensory deficits b/l. Globally weak  PSYCHIATRIC: The patient is alert and oriented x 3.  SKIN: No obvious rash, lesion, or ulcer.   DATA REVIEW:   CBC  Recent Labs Lab 04/11/16 0823  WBC 8.6  HGB 8.8*  HCT 26.6*  PLT 221    Chemistries   Recent Labs Lab 04/10/16 1639 04/11/16 0823  NA 131*  --   K 5.3*  --   CL 93*  --   CO2 20*  --   GLUCOSE 124*  --   BUN 58*  --   CREATININE 9.19*  --   CALCIUM 7.8*  --   AST  --  19  ALT  --  24  ALKPHOS  --  69  BILITOT  --  0.6    Cardiac Enzymes  Recent Labs Lab 04/07/16 1612  TROPONINI 0.08*    Microbiology Results  Results for orders placed or performed during the hospital encounter of 04/07/16   Culture, body fluid-bottle     Status: None (Preliminary result)   Collection Time: 04/08/16  9:05 PM  Result Value Ref Range Status   Specimen Description PLEURAL  Final   Special Requests NONE  Final   Culture NO GROWTH 2 DAYS  Final   Report Status PENDING  Incomplete    RADIOLOGY:  Dg Abd 1 View  04/11/2016  CLINICAL DATA:  Right side abdominal pain for 3 days, nausea and vomiting EXAM: ABDOMEN - 1 VIEW COMPARISON:  04/07/2016 FINDINGS: There is normal small bowel gas pattern. Moderate gas and residual contrast material noted throughout the colon. IVC filter in place. Postcholecystectomy surgical clips are noted. Again noted a peritoneal dialysis catheter with tip within pelvis. IMPRESSION: There is normal small bowel gas pattern. Moderate gas and residual contrast material noted throughout the colon. IVC filter in place. Electronically Signed   By: Natasha Mead M.D.   On: 04/11/2016 08:45      Management plans discussed with the patient, family and they are in agreement.  CODE STATUS:  Code Status History    Date Active Date Inactive Code Status Order ID Comments User Context   04/08/2016  4:40 AM 04/11/2016  3:28 PM Full Code [id number]  Arnaldo Natal, MD ED   03/11/2016  6:26 PM 03/14/2016  5:12 PM Full Code [id number]  Ozella Rocks, MD Inpatient   03/05/2016  2:09 AM 03/11/2016  5:54 PM Full Code [id number]  Oralia Manis, MD ED   02/06/2016 10:18 PM 02/12/2016  4:38 PM DNR [id number]  Milagros Loll, MD ED   12/11/2015  3:28 AM 12/19/2015  4:53 PM Full Code [id number]  Ihor Austin, MD Inpatient   11/17/2015  4:50 PM 11/20/2015  3:18 PM Full Code [id number]  Houston Siren, MD Inpatient   10/11/2015  9:32 PM 10/12/2015  5:10 PM Full Code [id number]  Oralia Manis, MD Inpatient   06/23/2015 12:43 AM 06/24/2015  2:21 PM Full Code [id number]  Oralia Manis, MD Inpatient   06/15/2015  2:57 AM 06/16/2015  4:01 PM Full Code [id number]  Wyatt Haste, MD ED      TOTAL TIME TAKING CARE OF THIS PATIENT:  40  minutes.    Houston Siren M.D on 04/11/2016 at 3:52 PM  Between 7am to 6pm - Pager - [phone]  After 6pm go to [email or web address] - password [email or web address] [facility]  [facility]  Office  [phone]  CC: Primary care physician; Sherlene Shams, MD

## 2016-04-11 NOTE — Telephone Encounter (Signed)
Thank you. Will continue to follow as appropriate. 

## 2016-04-11 NOTE — Care Management Obs Status (Signed)
MEDICARE OBSERVATION STATUS NOTIFICATION   Patient Details  Name: Dorothy GarretDonnella R Mcevers MRN: [id number] Date of Birth: Sep 09, 1961   Medicare Observation Status Notification Given:  Yes signed copy to HIM    Chapman FitchBOWEN, Zaylia Riolo T, RN 04/11/2016, 11:48 AM

## 2016-04-11 NOTE — Telephone Encounter (Signed)
HFU, Pt is being discharged today from the hospital. Pt is scheduled for 04/14/2016 @ 8am. Thank you!

## 2016-04-13 LAB — CULTURE, BODY FLUID W GRAM STAIN -BOTTLE: Culture: NO GROWTH

## 2016-04-14 ENCOUNTER — Ambulatory Visit (INDEPENDENT_AMBULATORY_CARE_PROVIDER_SITE_OTHER): Payer: BC Managed Care – PPO | Admitting: Internal Medicine

## 2016-04-14 ENCOUNTER — Encounter: Payer: Self-pay | Admitting: Internal Medicine

## 2016-04-14 VITALS — BP 100/68 | HR 81 | Temp 98.1°F | Resp 12 | Ht 70.0 in | Wt 109.0 lb

## 2016-04-14 DIAGNOSIS — N185 Chronic kidney disease, stage 5: Secondary | ICD-10-CM

## 2016-04-14 DIAGNOSIS — N186 End stage renal disease: Secondary | ICD-10-CM | POA: Diagnosis not present

## 2016-04-14 DIAGNOSIS — K7460 Unspecified cirrhosis of liver: Secondary | ICD-10-CM

## 2016-04-14 DIAGNOSIS — I050 Rheumatic mitral stenosis: Secondary | ICD-10-CM | POA: Diagnosis not present

## 2016-04-14 DIAGNOSIS — R634 Abnormal weight loss: Secondary | ICD-10-CM

## 2016-04-14 DIAGNOSIS — Z09 Encounter for follow-up examination after completed treatment for conditions other than malignant neoplasm: Secondary | ICD-10-CM

## 2016-04-14 DIAGNOSIS — Z992 Dependence on renal dialysis: Secondary | ICD-10-CM

## 2016-04-14 DIAGNOSIS — I052 Rheumatic mitral stenosis with insufficiency: Secondary | ICD-10-CM

## 2016-04-14 DIAGNOSIS — J90 Pleural effusion, not elsewhere classified: Secondary | ICD-10-CM

## 2016-04-14 DIAGNOSIS — J948 Other specified pleural conditions: Secondary | ICD-10-CM

## 2016-04-14 LAB — PROTEIN, BODY FLUID

## 2016-04-14 LAB — BODY FLUID CELL COUNT WITH DIFFERENTIAL
EOS FL: 0 %
LYMPHS FL: 68 %
MONOCYTE-MACROPHAGE-SEROUS FLUID: 27 %
Neutrophil Count, Fluid: 5 %
Total Nucleated Cell Count, Fluid: 11 cu mm

## 2016-04-14 LAB — PATHOLOGIST SMEAR REVIEW

## 2016-04-14 MED ORDER — OXYCODONE HCL 5 MG PO TABS: 5.0000 mg | ORAL_TABLET | Freq: Four times a day (QID) | ORAL | Status: AC | PRN

## 2016-04-14 NOTE — Patient Instructions (Signed)
[phone]  I am having Hospice come out to meet you and help make this transition of care to quality and comfort and help keep you out of the hospital

## 2016-04-14 NOTE — Progress Notes (Signed)
Pre-visit discussion using our clinic review tool. No additional management support is needed unless otherwise documented below in the visit note.  

## 2016-04-14 NOTE — Progress Notes (Signed)
Subjective:  Patient ID: Dorothy Long, female    DOB: 09/27/1961  Age: 55 y.o. MRN: [id number]  CC: The primary encounter diagnosis was End stage kidney disease ([place]). Diagnoses of Pleural effusion, Loss of weight, Mitral valve stenosis, Chronic kidney disease with peritoneal dialysis as preferred modality, stage 5 ([place]), Cirrhosis, non-alcoholic ([place]), Hospital discharge follow-up, Pleural effusion, right, and Mitral stenosis with incompetence or regurgitation were also pertinent to this visit.  HPI Dorothy Long presents for hospital follow up.  Admitted to [facility] with right upper abd pain/ right sided chest pain,  Negative workup .  Treated for constipation and discharged on Friday.  Received dialysis in house per dc summary and nephrology PN.  Admitted to [facility] on Saturday due to persistent pain.   Did not do PD at home on Friday night,  Electrolytes were off (K was 5.3) so [facility] admitted her overnight,  Dialyzed her , and discharged home on Sunday (yesterday).  Patient is  lethargic,  Slumped over in wheelchair.  Husband iis with her and reports that the [facility] RN was unable to pull off any fluid from Pleurex catheter during most recent visit se he brought the tubing /kit with him today.  Last chest x ray on April 28 confirms that catheter placement is contiguous with loculated pleural effusion.  Patient does not want it to be drained in the office today and requests to go home.  Appetite is poor.  More weight loss noted.  13 lbs since December.  Per DC summary and [place] report she has a Pulmonology appt tomorrow.   deicarged on Fr[place].  Husband took her to [facility] Saturday . Patient still reports pain that has not been adequately managed despite two hospital admissions in the last 5 days.    reviewed her recent hospital admissions with patient and husband,  Along with a summary of the last 4-5 admissions and her general health, which is poor.   Discussed quality of life with patient and husband.  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  IVC Filter .  She has CAD , 2 vessel, and PAD with carotid artery stenosis and  Severe left common iliac stenosis. Nonalcoholic cirrhosis with passive congestion by recent CT .  She has developed persistent pleural effusion secondary to pulmonary hypertension from severe mitral stenosis/mitral regurgitation and has a pleurex catheter that was placed March 30 and drained 1.2 L at time of placement.  During March admission she was advised to consider having  MVR by Dr Fletcher Anon  And she was undecided , but  her preference was to have it done at [facility].   Given her current state of malnutrition,  ESKD,  Nonalcoholic cirrhosis,  Dilated valvular and ischemic cardiomyopathy, with 2 vessel disease by March 2017 R&L heart cath, pulmonary hypertension , histor of CVA, carotid artery stenosis , history of acute  On chronic anemia with recent  GI bleed secondary to severe esophagitis and rectal ulcers (January 2017) , we dicussed her ongoing decline.  It is my opinion that she is high risk for peri operative mortality for a mitral valve replacement.   She is tired of being in and out of the hospital and agrees that her quality of life is poor.  Discussed Hospice care.  Husband is understandably surprised and distraught , but patient is not.  They have not had an EOL discussion despite her multiple comorbidities and persistent decline over the last  6 months.    Outpatient Prescriptions Prior to Visit  Medication Sig Dispense Refill  . acetaminophen (TYLENOL) 325 MG tablet Take 650 mg by mouth every 4 (four) hours as needed for mild pain or headache.     . albuterol (PROVENTIL HFA;VENTOLIN HFA) 108 (90 Base) MCG/ACT inhaler Inhale 2 puffs into the lungs every 6 (six) hours as needed for wheezing or shortness of breath.    Marland Kitchen aspirin 81 MG chewable tablet Chew 81 mg by mouth daily.    Marland Kitchen atorvastatin (LIPITOR) 80 MG tablet Take 80 mg by mouth daily.     .  calcitRIOL (ROCALTROL) 0.5 MCG capsule Take 0.5 mcg by mouth daily.     . calcium acetate (PHOSLO) 667 MG capsule Take 2,001 mg by mouth 3 (three) times daily with meals.    . cinacalcet (SENSIPAR) 30 MG tablet Take 30 mg by mouth daily with breakfast.     . citalopram (CELEXA) 20 MG tablet Take 20 mg by mouth daily.    . clopidogrel (PLAVIX) 75 MG tablet Take 75 mg by mouth daily.    Marland Kitchen docusate sodium (COLACE) 100 MG capsule Take 100 mg by mouth daily as needed for mild constipation.     . magnesium oxide (MAG-OX) 400 (241.3 Mg) MG tablet Take 1 tablet (400 mg total) by mouth daily. 30 tablet 0  . ondansetron (ZOFRAN-ODT) 4 MG disintegrating tablet Take 4 mg by mouth every 8 (eight) hours as needed for nausea or vomiting.     . pantoprazole (PROTONIX) 40 MG tablet Take 40 mg by mouth 2 (two) times daily.     . sodium chloride 1 g tablet Take 2 g by mouth 2 (two) times daily with a meal.    . sucralfate (CARAFATE) 1 GM/10ML suspension Take 10 mLs (1 g total) by mouth every 6 (six) hours. 420 mL 0  . tiotropium (SPIRIVA) 18 MCG inhalation capsule Place 1 capsule (18 mcg total) into inhaler and inhale daily. 30 capsule 0  . oxyCODONE (ROXICODONE) 5 MG immediate release tablet Take 1 tablet (5 mg total) by mouth every 6 (six) hours as needed for severe pain. 30 tablet 0  . epoetin alfa (EPOGEN,PROCRIT) 92446 UNIT/ML injection Inject 25,000 Units into the skin once a week.     No facility-administered medications prior to visit.    Review of Systems;  Patient denies headache, fevers, malaise, unintentional weight loss, skin rash, eye pain, sinus congestion and sinus pain, sore throat, dysphagia,  hemoptysis , cough, dyspnea, wheezing, chest pain, palpitations, orthopnea, edema, abdominal pain, nausea, melena, diarrhea, constipation, flank pain, dysuria, hematuria, urinary  Frequency, nocturia, numbness, tingling, seizures,  Focal weakness, Loss of consciousness,  Tremor, insomnia, depression,  anxiety, and suicidal ideation.      Objective:  BP 100/68 mmHg  Pulse 81  Temp(Src) 98.1 F (36.7 C) (Oral)  Resp 12  Ht _0  (1.778 m)  Wt 109 lb (49.442 kg)  BMI 15.64 kg/m2  SpO2 93%  BP Readings from Last 3 Encounters:  04/14/16 100/68  04/11/16 118/88  03/17/16 104/70    Wt Readings from Last 3 Encounters:  04/14/16 109 lb (49.442 kg)  04/11/16 108 lb 6.4 oz (49.17 kg)  03/17/16 111 lb (50.349 kg)    General appearance: alert, cooperative and appears stated age Ears: normal TM's and external ear canals both ears Throat: lips, mucosa, and tongue normal; teeth and gums normal Neck: no adenopathy, no carotid bruit, supple, symmetrical, trachea midline and thyroid not enlarged,  symmetric, no tenderness/mass/nodules Back: symmetric, no curvature. ROM normal. No CVA tenderness. Lungs: clear to auscultation bilaterally Heart: regular rate and rhythm, S1, S2 normal, no murmur, click, rub or gallop Abdomen: soft, non-tender; bowel sounds normal; no masses,  no organomegaly Pulses: 2+ and symmetric Skin: Skin color, texture, turgor normal. No rashes or lesions Lymph nodes: Cervical, supraclavicular, and axillary nodes normal.  Lab Results  Component Value Date   HGBA1C 5.1 04/07/2016   HGBA1C 5.5 03/12/2016    Lab Results  Component Value Date   CREATININE 9.19* 04/10/2016   CREATININE 8.91* 04/07/2016   CREATININE 8.63* 03/17/2016    Lab Results  Component Value Date   WBC 8.6 04/11/2016   HGB 8.8* 04/11/2016   HCT 26.6* 04/11/2016   PLT 221 04/11/2016   GLUCOSE 124* 04/10/2016   CHOL 137 12/11/2015   TRIG 71 12/11/2015   HDL 42 12/11/2015   LDLDIRECT 164.6 07/21/2012   LDLCALC 81 12/11/2015   ALT 24 04/11/2016   AST 19 04/11/2016   NA 131* 04/10/2016   K 5.3* 04/10/2016   CL 93* 04/10/2016   CREATININE 9.19* 04/10/2016   BUN 58* 04/10/2016   CO2 20* 04/10/2016   TSH 3.781 04/07/2016   INR 1.09 04/07/2016   HGBA1C 5.1 04/07/2016   MICROALBUR  63.1* 06/19/2014    Dg Chest 2 View  04/07/2016  CLINICAL DATA:  Patient with altered mental status. History of congestive heart failure. Right chest tube in place for 3 weeks. EXAM: CHEST  2 VIEW COMPARISON:  Chest radiograph 03/13/2016. FINDINGS: Monitoring leads overlie the patient. Stable cardiomegaly. Small bore right chest tube remains in position. Interval development of a small right pleural effusion. Heterogeneous opacities right lung base. No pneumothorax. Thoracic spine degenerative changes. IMPRESSION: Interval development of a small right pleural effusion with underlying opacities favored to represent atelectasis. Right chest tube remains in place. Electronically Signed   By: Lovey Newcomer M.D.   On: 04/07/2016 16:41   Ct Angio Chest Pe W/cm &/or Wo Cm  04/08/2016  CLINICAL DATA:  Right-sided abdominal pain. Right-sided chest pain at site of tunneled pleural catheter. EXAM: CT ANGIOGRAPHY CHEST CT ABDOMEN AND PELVIS WITH CONTRAST TECHNIQUE: Multidetector CT imaging of the chest was performed using the standard protocol during bolus administration of intravenous contrast. Multiplanar CT image reconstructions and MIPs were obtained to evaluate the vascular anatomy. Multidetector CT imaging of the abdomen and pelvis was performed using the standard protocol during bolus administration of intravenous contrast. CONTRAST:  75 cc Isovue 370 intravenous COMPARISON:  Abdominal CT 02/08/2016 FINDINGS: CTA CHEST FINDINGS THORACIC INLET/BODY WALL: No acute finding. MEDIASTINUM: Cardiomegaly with left atrial and right heart dilatation. Mitral valve calcification which is bulky and may be caseous. Extensive atherosclerosis. Pulmonary hypertension with dilated pulmonary arterial tree. Intermittent motion degradation. No suspected acute pulmonary embolism. No acute aortic finding. LUNG WINDOWS: Moderate right pleural effusion with some posterior and fissural loculation but no pleural thickening or nodularity. A  tunneled pleural catheter is in unremarkable position, contiguous with pleural fluid. Mild fluid in the chest wall at site of catheter insertion, without rim enhancing collection. Dependent atelectasis, greater on the right. Mild septal thickening at the apices but no overt pulmonary edema. 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 CT ABDOMEN and PELVIS FINDINGS Abdominal wall:  No contributory findings. Hepatobiliary: Cirrhotic morphology. Nutmeg pattern enhancement attributed to passive congestion.Cholecystectomy with negative common bile duct. Pancreas: Unremarkable. Spleen: Unremarkable. Adrenals/Urinary Tract: Negative adrenals. Marked renal atrophy with absent enhancement. Unremarkable  bladder. Reproductive:Hysterectomy.  Negative adnexa. Stomach/Bowel:  No obstruction. No inflammatory changes. Vascular/Lymphatic: No acute vascular abnormality. IVC filter in place. Extensive atherosclerosis. High-grade left common iliac artery stenosis from bulky calcified plaque. No mass or adenopathy. Peritoneal: Small volume ascites/dialysate. Tenckhoff catheter in the right pelvis. 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 Review of the MIP images confirms the above findings. IMPRESSION: 1. No acute finding or change since prior. Negative for pulmonary embolism. 2. Moderate right pleural effusion with tunneled pleural catheter in place. 3. Cirrhosis. 4. Additional chronic findings are described above. Electronically Signed   By: Monte Fantasia M.D.   On: 04/08/2016 00:14   Ct Abdomen Pelvis W Contrast  04/08/2016  CLINICAL DATA:  Right-sided abdominal pain. Right-sided chest pain at site of tunneled pleural catheter. EXAM: CT ANGIOGRAPHY CHEST CT ABDOMEN AND PELVIS WITH CONTRAST TECHNIQUE: Multidetector CT imaging of the chest was performed using the standard protocol during bolus administration of intravenous contrast. Multiplanar CT image reconstructions and MIPs were  obtained to evaluate the vascular anatomy. Multidetector CT imaging of the abdomen and pelvis was performed using the standard protocol during bolus administration of intravenous contrast. CONTRAST:  75 cc Isovue 370 intravenous COMPARISON:  Abdominal CT 02/08/2016 FINDINGS: CTA CHEST FINDINGS THORACIC INLET/BODY WALL: No acute finding. MEDIASTINUM: Cardiomegaly with left atrial and right heart dilatation. Mitral valve calcification which is bulky and may be caseous. Extensive atherosclerosis. Pulmonary hypertension with dilated pulmonary arterial tree. Intermittent motion degradation. No suspected acute pulmonary embolism. No acute aortic finding. LUNG WINDOWS: Moderate right pleural effusion with some posterior and fissural loculation but no pleural thickening or nodularity. A tunneled pleural catheter is in unremarkable position, contiguous with pleural fluid. Mild fluid in the chest wall at site of catheter insertion, without rim enhancing collection. Dependent atelectasis, greater on the right. Mild septal thickening at the apices but no overt pulmonary edema. 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 CT ABDOMEN and PELVIS FINDINGS Abdominal wall:  No contributory findings. Hepatobiliary: Cirrhotic morphology. Nutmeg pattern enhancement attributed to passive congestion.Cholecystectomy with negative common bile duct. Pancreas: Unremarkable. Spleen: Unremarkable. Adrenals/Urinary Tract: Negative adrenals. Marked renal atrophy with absent enhancement. Unremarkable bladder. Reproductive:Hysterectomy.  Negative adnexa. Stomach/Bowel:  No obstruction. No inflammatory changes. Vascular/Lymphatic: No acute vascular abnormality. IVC filter in place. Extensive atherosclerosis. High-grade left common iliac artery stenosis from bulky calcified plaque. No mass or adenopathy. Peritoneal: Small volume ascites/dialysate. Tenckhoff catheter in the right pelvis. Musculoskeletal: Sclerotic bones attributed to  renal osteodystrophy. Review of the MIP images confirms the above findings. IMPRESSION: 1. No acute finding or change since prior. Negative for pulmonary embolism. 2. Moderate right pleural effusion with tunneled pleural catheter in place. 3. Cirrhosis. 4. Additional chronic findings are described above. Electronically Signed   By: Monte Fantasia M.D.   On: 04/08/2016 00:14    Assessment & Plan:   Problem List Items Addressed This Visit    Chronic kidney disease with peritoneal dialysis as preferred modality, stage 5 ([place]) (Chronic)    Managed with PD. Advised to continue daily PD.  She did not want labs checked today.       Mitral stenosis with incompetence or regurgitation    TEE done April 2017.  Replacement advised .  Patient is high risk for perioperative mortality. I do not think her chance for survival is greater than her chance of dying from a complication.  I have advised her to consider Hospice, to which she is agreeable .       Cirrhosis,  non-alcoholic ([place])    Suggested by CT , aggravated by cardiac decompensation causing congestion      Pleural effusion, right    Persistent, with pelurex catheter placed March 30 for drainage of 1.25 L transudative.  Recent report from [place] notes problem with accessing fluid due to fibrin strands,  patient to see pulmonology tomorrow       Hospital discharge follow-up    Events of last 2 hospitalizations and recent imaging studies discussed with patient and husband today.  continue Home Health an dpulmonary/nephrology follow up.   Hospice referral made today       Mitral valve stenosis (Chronic)   Relevant Orders   Ambulatory referral to Hospice    Other Visit Diagnoses    End stage kidney disease ([place])    -  Primary    Relevant Orders    Ambulatory referral to Hospice    Pleural effusion        Relevant Orders    Ambulatory referral to Hospice    Loss of weight        Relevant Orders    Ambulatory referral to Hospice        I am having Ms. Clapper maintain her acetaminophen, tiotropium, atorvastatin, calcitRIOL, pantoprazole, aspirin, cinacalcet, docusate sodium, ondansetron, sodium chloride, sucralfate, albuterol, calcium acetate, magnesium oxide, citalopram, clopidogrel, epoetin alfa, and oxyCODONE.  Meds ordered this encounter  Medications  . oxyCODONE (ROXICODONE) 5 MG immediate release tablet    Sig: Take 1 tablet (5 mg total) by mouth every 6 (six) hours as needed for severe pain.    Dispense:  120 tablet    Refill:  0    Medications Discontinued During This Encounter  Medication Reason  . oxyCODONE (ROXICODONE) 5 MG immediate release tablet Reorder    Follow-up: No Follow-up on file.   [name], MD

## 2016-04-15 ENCOUNTER — Inpatient Hospital Stay: Payer: BC Managed Care – PPO | Admitting: Internal Medicine

## 2016-04-15 ENCOUNTER — Encounter: Payer: Self-pay | Admitting: *Deleted

## 2016-04-15 NOTE — Assessment & Plan Note (Signed)
Persistent, with pelurex catheter placed March 30 for drainage of 1.25 L transudative.  Recent report from Home Health notes problem with accessing fluid due to fibrin strands,  patient to see pulmonology tomorrow

## 2016-04-15 NOTE — Assessment & Plan Note (Signed)
Managed with PD. Advised to continue daily PD.  She did not want labs checked today.

## 2016-04-15 NOTE — Assessment & Plan Note (Signed)
Events of last 2 hospitalizations and recent imaging studies discussed with patient and husband today.  continue Home Health an dpulmonary/nephrology follow up.   Hospice referral made today

## 2016-04-15 NOTE — Assessment & Plan Note (Signed)
Suggested by CT , aggravated by cardiac decompensation causing congestion

## 2016-04-15 NOTE — Assessment & Plan Note (Signed)
TEE done April 2017.  Replacement advised .  Patient is high risk for perioperative mortality. I do not think her chance for survival is greater than her chance of dying from a complication.  I have advised her to consider Hospice, to which she is agreeable .

## 2016-04-18 ENCOUNTER — Telehealth: Payer: Self-pay | Admitting: Internal Medicine

## 2016-04-18 NOTE — Telephone Encounter (Signed)
Caller name: Logan BoresDwight Elgersma Relationship to patient: spouse Can be reached:  Reason for call: pt faxed  FMLA form that he needed filled out for his job. Paper work in Dr. Melina Schoolsullo's box.

## 2016-04-18 NOTE — Telephone Encounter (Signed)
In red folder. 

## 2016-04-20 DIAGNOSIS — Z7689 Persons encountering health services in other specified circumstances: Secondary | ICD-10-CM

## 2016-04-20 NOTE — Telephone Encounter (Signed)
FMLA completed,  For mr Ratiliffe.  Charge is $50

## 2016-04-21 NOTE — Telephone Encounter (Signed)
Notified patient DPR FMLA ready for pickup placed copy to chart and copy for billing.

## 2016-05-05 ENCOUNTER — Telehealth: Payer: Self-pay | Admitting: Internal Medicine

## 2016-05-05 NOTE — Telephone Encounter (Signed)
[employer] dropped off pt's death certificate to be singed. Will put in Dr. Melina Schoolsullo's box.

## 2016-05-05 NOTE — Telephone Encounter (Signed)
In red folder. 

## 2016-05-06 NOTE — Telephone Encounter (Signed)
Notified funeral home death certificate ready for pick up placed copy to chart and  Original at front desk.

## 2016-05-07 ENCOUNTER — Telehealth: Payer: Self-pay | Admitting: *Deleted

## 2016-05-07 NOTE — Telephone Encounter (Signed)
Dian QueenKaren Hall from [employer] [phone] states the death certificates wasn't completely filed out and would like a call back as soon as possible

## 2016-05-07 NOTE — Telephone Encounter (Signed)
The block was patient a former smoker was not checked advised from record patient was a former smoker.

## 2016-05-15 DEATH — deceased
